# Patient Record
Sex: Male | Born: 1935 | Race: White | Hispanic: No | Marital: Married | Smoking: Never smoker
Health system: Southern US, Community
[De-identification: ages and names within clinical notes are randomized; demographics above are authoritative.]

## PROBLEM LIST (undated history)

## (undated) DIAGNOSIS — R142 Eructation: Secondary | ICD-10-CM

## (undated) DIAGNOSIS — K208 Other esophagitis without bleeding: Secondary | ICD-10-CM

## (undated) DIAGNOSIS — I2510 Atherosclerotic heart disease of native coronary artery without angina pectoris: Secondary | ICD-10-CM

## (undated) DIAGNOSIS — K219 Gastro-esophageal reflux disease without esophagitis: Secondary | ICD-10-CM

## (undated) DIAGNOSIS — R143 Flatulence: Secondary | ICD-10-CM

## (undated) DIAGNOSIS — R141 Gas pain: Secondary | ICD-10-CM

## (undated) DIAGNOSIS — K5730 Diverticulosis of large intestine without perforation or abscess without bleeding: Secondary | ICD-10-CM

## (undated) DIAGNOSIS — K449 Diaphragmatic hernia without obstruction or gangrene: Secondary | ICD-10-CM

## (undated) DIAGNOSIS — K760 Fatty (change of) liver, not elsewhere classified: Secondary | ICD-10-CM

## (undated) DIAGNOSIS — K5090 Crohn's disease, unspecified, without complications: Secondary | ICD-10-CM

## (undated) DIAGNOSIS — G20 Parkinson's disease: Secondary | ICD-10-CM

## (undated) DIAGNOSIS — C439 Malignant melanoma of skin, unspecified: Secondary | ICD-10-CM

## (undated) DIAGNOSIS — K7689 Other specified diseases of liver: Secondary | ICD-10-CM

## (undated) DIAGNOSIS — G20A1 Parkinson's disease without dyskinesia, without mention of fluctuations: Secondary | ICD-10-CM

## (undated) DIAGNOSIS — H9190 Unspecified hearing loss, unspecified ear: Secondary | ICD-10-CM

## (undated) DIAGNOSIS — R269 Unspecified abnormalities of gait and mobility: Secondary | ICD-10-CM

## (undated) DIAGNOSIS — I255 Ischemic cardiomyopathy: Secondary | ICD-10-CM

## (undated) DIAGNOSIS — G2581 Restless legs syndrome: Secondary | ICD-10-CM

## (undated) DIAGNOSIS — R413 Other amnesia: Secondary | ICD-10-CM

## (undated) DIAGNOSIS — E785 Hyperlipidemia, unspecified: Secondary | ICD-10-CM

## (undated) DIAGNOSIS — I5032 Chronic diastolic (congestive) heart failure: Secondary | ICD-10-CM

## (undated) DIAGNOSIS — K5100 Ulcerative (chronic) pancolitis without complications: Secondary | ICD-10-CM

## (undated) DIAGNOSIS — L0390 Cellulitis, unspecified: Secondary | ICD-10-CM

## (undated) DIAGNOSIS — I10 Essential (primary) hypertension: Secondary | ICD-10-CM

## (undated) DIAGNOSIS — R32 Unspecified urinary incontinence: Secondary | ICD-10-CM

## (undated) DIAGNOSIS — B49 Unspecified mycosis: Secondary | ICD-10-CM

## (undated) DIAGNOSIS — K5000 Crohn's disease of small intestine without complications: Secondary | ICD-10-CM

## (undated) DIAGNOSIS — M810 Age-related osteoporosis without current pathological fracture: Secondary | ICD-10-CM

## (undated) DIAGNOSIS — I951 Orthostatic hypotension: Secondary | ICD-10-CM

## (undated) DIAGNOSIS — I4510 Unspecified right bundle-branch block: Secondary | ICD-10-CM

## (undated) DIAGNOSIS — N200 Calculus of kidney: Secondary | ICD-10-CM

## (undated) HISTORY — DX: Hyperlipidemia, unspecified: E78.5

## (undated) HISTORY — PX: CHOLECYSTECTOMY: SHX55

## (undated) HISTORY — DX: Parkinson's disease without dyskinesia, without mention of fluctuations: G20.A1

## (undated) HISTORY — DX: Cellulitis, unspecified: L03.90

## (undated) HISTORY — DX: Crohn's disease, unspecified, without complications: K50.90

## (undated) HISTORY — PX: OTHER SURGICAL HISTORY: SHX169

## (undated) HISTORY — DX: Diaphragmatic hernia without obstruction or gangrene: K44.9

## (undated) HISTORY — DX: Crohn's disease of small intestine without complications: K50.00

## (undated) HISTORY — DX: Ischemic cardiomyopathy: I25.5

## (undated) HISTORY — DX: Unspecified right bundle-branch block: I45.10

## (undated) HISTORY — PX: BACK SURGERY: SHX140

## (undated) HISTORY — PX: TONSILLECTOMY: SUR1361

## (undated) HISTORY — DX: Gastro-esophageal reflux disease without esophagitis: K21.9

## (undated) HISTORY — DX: Unspecified abnormalities of gait and mobility: R26.9

## (undated) HISTORY — PX: APPENDECTOMY: SHX54

## (undated) HISTORY — DX: Other specified diseases of liver: K76.89

## (undated) HISTORY — PX: CATARACT EXTRACTION: SUR2

## (undated) HISTORY — DX: Parkinson's disease: G20

## (undated) HISTORY — DX: Unspecified hearing loss, unspecified ear: H91.90

## (undated) HISTORY — DX: Flatulence: R14.3

## (undated) HISTORY — DX: Restless legs syndrome: G25.81

## (undated) HISTORY — DX: Essential (primary) hypertension: I10

## (undated) HISTORY — DX: Malignant melanoma of skin, unspecified: C43.9

## (undated) HISTORY — DX: Chronic diastolic (congestive) heart failure: I50.32

## (undated) HISTORY — DX: Atherosclerotic heart disease of native coronary artery without angina pectoris: I25.10

## (undated) HISTORY — DX: Other esophagitis: K20.8

## (undated) HISTORY — DX: Flatulence: R14.2

## (undated) HISTORY — DX: Other amnesia: R41.3

## (undated) HISTORY — DX: Unspecified urinary incontinence: R32

## (undated) HISTORY — DX: Ulcerative (chronic) pancolitis without complications: K51.00

## (undated) HISTORY — DX: Other esophagitis without bleeding: K20.80

## (undated) HISTORY — DX: Orthostatic hypotension: I95.1

## (undated) HISTORY — DX: Unspecified mycosis: B49

## (undated) HISTORY — DX: Gas pain: R14.1

## (undated) HISTORY — DX: Calculus of kidney: N20.0

## (undated) HISTORY — DX: Diverticulosis of large intestine without perforation or abscess without bleeding: K57.30

## (undated) HISTORY — DX: Age-related osteoporosis without current pathological fracture: M81.0

---
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
## 1997-09-29 ENCOUNTER — Encounter (INDEPENDENT_AMBULATORY_CARE_PROVIDER_SITE_OTHER): Payer: Self-pay | Admitting: Gastroenterology

## 1997-09-29 DIAGNOSIS — K2210 Ulcer of esophagus without bleeding: Secondary | ICD-10-CM | POA: Insufficient documentation

## 1997-09-29 DIAGNOSIS — K449 Diaphragmatic hernia without obstruction or gangrene: Secondary | ICD-10-CM | POA: Insufficient documentation

## 1998-01-12 ENCOUNTER — Ambulatory Visit (HOSPITAL_COMMUNITY): Admission: RE | Admit: 1998-01-12 | Discharge: 1998-01-12 | Payer: Self-pay | Admitting: *Deleted

## 1998-04-11 ENCOUNTER — Encounter: Payer: Self-pay | Admitting: Neurological Surgery

## 1998-04-17 ENCOUNTER — Encounter: Payer: Self-pay | Admitting: Neurological Surgery

## 1998-04-17 ENCOUNTER — Inpatient Hospital Stay (HOSPITAL_COMMUNITY): Admission: RE | Admit: 1998-04-17 | Discharge: 1998-04-20 | Payer: Self-pay | Admitting: Neurological Surgery

## 1999-12-11 ENCOUNTER — Encounter (INDEPENDENT_AMBULATORY_CARE_PROVIDER_SITE_OTHER): Payer: Self-pay

## 1999-12-11 ENCOUNTER — Other Ambulatory Visit: Admission: RE | Admit: 1999-12-11 | Discharge: 1999-12-11 | Payer: Self-pay | Admitting: Gastroenterology

## 1999-12-11 DIAGNOSIS — K5100 Ulcerative (chronic) pancolitis without complications: Secondary | ICD-10-CM | POA: Insufficient documentation

## 2002-01-01 ENCOUNTER — Ambulatory Visit (HOSPITAL_COMMUNITY): Admission: RE | Admit: 2002-01-01 | Discharge: 2002-01-01 | Payer: Self-pay | Admitting: Gastroenterology

## 2002-01-01 ENCOUNTER — Encounter: Payer: Self-pay | Admitting: Gastroenterology

## 2004-01-23 ENCOUNTER — Ambulatory Visit (HOSPITAL_COMMUNITY): Admission: RE | Admit: 2004-01-23 | Discharge: 2004-01-23 | Payer: Self-pay | Admitting: Gastroenterology

## 2004-02-14 ENCOUNTER — Ambulatory Visit: Payer: Self-pay | Admitting: Gastroenterology

## 2004-12-12 ENCOUNTER — Ambulatory Visit: Payer: Self-pay | Admitting: Gastroenterology

## 2005-03-14 ENCOUNTER — Ambulatory Visit: Payer: Self-pay | Admitting: Gastroenterology

## 2005-03-26 ENCOUNTER — Ambulatory Visit: Payer: Self-pay | Admitting: Gastroenterology

## 2005-03-26 ENCOUNTER — Encounter (INDEPENDENT_AMBULATORY_CARE_PROVIDER_SITE_OTHER): Payer: Self-pay | Admitting: *Deleted

## 2005-03-26 DIAGNOSIS — K649 Unspecified hemorrhoids: Secondary | ICD-10-CM | POA: Insufficient documentation

## 2006-06-11 ENCOUNTER — Ambulatory Visit: Payer: Self-pay | Admitting: *Deleted

## 2006-06-11 ENCOUNTER — Ambulatory Visit: Payer: Self-pay | Admitting: Gastroenterology

## 2006-06-12 LAB — CONVERTED CEMR LAB
Folate: >20 ng/mL
Sed Rate: 26 mm/hr — ABNORMAL HIGH (ref 0–20)
Vitamin B-12: 311 pg/mL (ref 211–911)

## 2006-08-28 ENCOUNTER — Inpatient Hospital Stay (HOSPITAL_COMMUNITY): Admission: EM | Admit: 2006-08-28 | Discharge: 2006-09-05 | Payer: Self-pay | Admitting: Cardiology

## 2006-08-28 ENCOUNTER — Ambulatory Visit: Payer: Self-pay | Admitting: Internal Medicine

## 2006-09-04 ENCOUNTER — Ambulatory Visit: Payer: Self-pay | Admitting: Gastroenterology

## 2006-09-25 ENCOUNTER — Ambulatory Visit: Payer: Self-pay | Admitting: Internal Medicine

## 2006-09-25 HISTORY — PX: CARDIAC CATHETERIZATION: SHX172

## 2006-10-02 ENCOUNTER — Encounter (HOSPITAL_COMMUNITY): Admission: RE | Admit: 2006-10-02 | Discharge: 2006-12-31 | Payer: Self-pay | Admitting: Internal Medicine

## 2006-10-07 ENCOUNTER — Ambulatory Visit: Payer: Self-pay | Admitting: Gastroenterology

## 2006-10-07 LAB — CONVERTED CEMR LAB
ALT: 77 units/L — ABNORMAL HIGH (ref 0–53)
AST: 47 units/L — ABNORMAL HIGH (ref 0–37)
Albumin: 2.9 g/dL — ABNORMAL LOW (ref 3.5–5.2)
Alkaline Phosphatase: 49 units/L (ref 39–117)
BUN: 11 mg/dL (ref 6–23)
Basophils Absolute: 0 10*3/uL (ref 0.0–0.1)
Basophils Relative: 0.8 % (ref 0.0–1.0)
Bilirubin, Direct: 0.2 mg/dL (ref 0.0–0.3)
CO2: 32 meq/L (ref 19–32)
Calcium: 7.8 mg/dL — ABNORMAL LOW (ref 8.4–10.5)
Chloride: 111 meq/L (ref 96–112)
Creatinine, Ser: 0.8 mg/dL (ref 0.4–1.5)
Eosinophils Absolute: 0.1 10*3/uL (ref 0.0–0.6)
Eosinophils Relative: 1.8 % (ref 0.0–5.0)
GFR calc Af Amer: 121 mL/min
GFR calc non Af Amer: 100 mL/min
Glucose, Bld: 126 mg/dL — ABNORMAL HIGH (ref 70–99)
HCT: 34.1 % — ABNORMAL LOW (ref 39.0–52.0)
Hemoglobin: 10.9 g/dL — ABNORMAL LOW (ref 13.0–17.0)
Lymphocytes Relative: 17.4 % (ref 12.0–46.0)
MCHC: 32.1 g/dL (ref 30.0–36.0)
MCV: 103 fL — ABNORMAL HIGH (ref 78.0–100.0)
Monocytes Absolute: 0.4 10*3/uL (ref 0.2–0.7)
Monocytes Relative: 9.7 % (ref 3.0–11.0)
Neutro Abs: 3.3 10*3/uL (ref 1.4–7.7)
Neutrophils Relative %: 70.3 % (ref 43.0–77.0)
Platelets: 314 10*3/uL (ref 150–400)
Potassium: 3.2 meq/L — ABNORMAL LOW (ref 3.5–5.1)
RBC: 3.31 M/uL — ABNORMAL LOW (ref 4.22–5.81)
RDW: 15.6 % — ABNORMAL HIGH (ref 11.5–14.6)
Sodium: 145 meq/L (ref 135–145)
Total Bilirubin: 1.8 mg/dL — ABNORMAL HIGH (ref 0.3–1.2)
Total Protein: 5.8 g/dL — ABNORMAL LOW (ref 6.0–8.3)
WBC: 4.6 10*3/uL (ref 4.5–10.5)

## 2006-10-13 ENCOUNTER — Ambulatory Visit: Payer: Self-pay | Admitting: Gastroenterology

## 2006-10-13 LAB — CONVERTED CEMR LAB
ALT: 89 units/L — ABNORMAL HIGH (ref 0–53)
AST: 68 units/L — ABNORMAL HIGH (ref 0–37)
Albumin: 3 g/dL — ABNORMAL LOW (ref 3.5–5.2)
Alkaline Phosphatase: 48 units/L (ref 39–117)
BUN: 9 mg/dL (ref 6–23)
Bilirubin, Direct: 0.3 mg/dL (ref 0.0–0.3)
CO2: 33 meq/L — ABNORMAL HIGH (ref 19–32)
Calcium: 8.6 mg/dL (ref 8.4–10.5)
Chloride: 108 meq/L (ref 96–112)
Creatinine, Ser: 0.8 mg/dL (ref 0.4–1.5)
GFR calc Af Amer: 121 mL/min
GFR calc non Af Amer: 100 mL/min
Glucose, Bld: 153 mg/dL — ABNORMAL HIGH (ref 70–99)
Potassium: 4.1 meq/L (ref 3.5–5.1)
Sodium: 145 meq/L (ref 135–145)
Total Bilirubin: 2.2 mg/dL — ABNORMAL HIGH (ref 0.3–1.2)
Total Protein: 6.3 g/dL (ref 6.0–8.3)

## 2006-10-14 ENCOUNTER — Encounter: Payer: Self-pay | Admitting: Internal Medicine

## 2006-10-14 ENCOUNTER — Ambulatory Visit: Payer: Self-pay

## 2006-10-24 ENCOUNTER — Ambulatory Visit: Payer: Self-pay | Admitting: Gastroenterology

## 2006-10-24 LAB — CONVERTED CEMR LAB
ALT: 333 units/L — ABNORMAL HIGH (ref 0–53)
AST: 325 units/L — ABNORMAL HIGH (ref 0–37)
Albumin: 3.2 g/dL — ABNORMAL LOW (ref 3.5–5.2)
Alkaline Phosphatase: 68 units/L (ref 39–117)
BUN: 16 mg/dL (ref 6–23)
Basophils Absolute: 0 10*3/uL (ref 0.0–0.1)
Basophils Relative: 0.4 % (ref 0.0–1.0)
CO2: 31 meq/L (ref 19–32)
Calcium: 9.3 mg/dL (ref 8.4–10.5)
Chloride: 101 meq/L (ref 96–112)
Creatinine, Ser: 0.8 mg/dL (ref 0.4–1.5)
Eosinophils Absolute: 0.1 10*3/uL (ref 0.0–0.6)
Eosinophils Relative: 1.8 % (ref 0.0–5.0)
GFR calc Af Amer: 121 mL/min
GFR calc non Af Amer: 100 mL/min
Glucose, Bld: 200 mg/dL — ABNORMAL HIGH (ref 70–99)
HCT: 35.4 % — ABNORMAL LOW (ref 39.0–52.0)
Hemoglobin: 11.8 g/dL — ABNORMAL LOW (ref 13.0–17.0)
Lymphocytes Relative: 14.8 % (ref 12.0–46.0)
MCHC: 33.3 g/dL (ref 30.0–36.0)
MCV: 104.9 fL — ABNORMAL HIGH (ref 78.0–100.0)
Monocytes Absolute: 0.4 10*3/uL (ref 0.2–0.7)
Monocytes Relative: 6.3 % (ref 3.0–11.0)
Neutro Abs: 4.8 10*3/uL (ref 1.4–7.7)
Neutrophils Relative %: 76.7 % (ref 43.0–77.0)
Platelets: 268 10*3/uL (ref 150–400)
Potassium: 5 meq/L (ref 3.5–5.1)
RBC: 3.37 M/uL — ABNORMAL LOW (ref 4.22–5.81)
RDW: 17.5 % — ABNORMAL HIGH (ref 11.5–14.6)
Sodium: 140 meq/L (ref 135–145)
Total Bilirubin: 3.3 mg/dL — ABNORMAL HIGH (ref 0.3–1.2)
Total Protein: 6.2 g/dL (ref 6.0–8.3)
WBC: 6.2 10*3/uL (ref 4.5–10.5)

## 2006-11-17 ENCOUNTER — Ambulatory Visit: Payer: Self-pay | Admitting: Gastroenterology

## 2006-11-17 LAB — CONVERTED CEMR LAB
ALT: 38 units/L (ref 0–53)
AST: 34 units/L (ref 0–37)
Albumin: 3.5 g/dL (ref 3.5–5.2)
Alkaline Phosphatase: 66 units/L (ref 39–117)
BUN: 13 mg/dL (ref 6–23)
Basophils Absolute: 0.1 10*3/uL (ref 0.0–0.1)
Basophils Relative: 1 % (ref 0.0–1.0)
Bilirubin, Direct: 0.2 mg/dL (ref 0.0–0.3)
CO2: 29 meq/L (ref 19–32)
Calcium: 8.7 mg/dL (ref 8.4–10.5)
Chloride: 105 meq/L (ref 96–112)
Creatinine, Ser: 1.2 mg/dL (ref 0.4–1.5)
Eosinophils Absolute: 0.1 10*3/uL (ref 0.0–0.6)
Eosinophils Relative: 2.1 % (ref 0.0–5.0)
GFR calc Af Amer: 76 mL/min
GFR calc non Af Amer: 63 mL/min
Glucose, Bld: 125 mg/dL — ABNORMAL HIGH (ref 70–99)
HCT: 35.5 % — ABNORMAL LOW (ref 39.0–52.0)
Hemoglobin: 12 g/dL — ABNORMAL LOW (ref 13.0–17.0)
Lymphocytes Relative: 21.9 % (ref 12.0–46.0)
MCHC: 33.8 g/dL (ref 30.0–36.0)
MCV: 103.5 fL — ABNORMAL HIGH (ref 78.0–100.0)
Monocytes Absolute: 0.8 10*3/uL — ABNORMAL HIGH (ref 0.2–0.7)
Monocytes Relative: 13.6 % — ABNORMAL HIGH (ref 3.0–11.0)
Neutro Abs: 3.7 10*3/uL (ref 1.4–7.7)
Neutrophils Relative %: 61.4 % (ref 43.0–77.0)
Platelets: 211 10*3/uL (ref 150–400)
Potassium: 4.4 meq/L (ref 3.5–5.1)
RBC: 3.43 M/uL — ABNORMAL LOW (ref 4.22–5.81)
RDW: 16 % — ABNORMAL HIGH (ref 11.5–14.6)
Sodium: 140 meq/L (ref 135–145)
Total Bilirubin: 1.3 mg/dL — ABNORMAL HIGH (ref 0.3–1.2)
Total Protein: 6.2 g/dL (ref 6.0–8.3)
WBC: 6 10*3/uL (ref 4.5–10.5)

## 2006-11-27 ENCOUNTER — Ambulatory Visit: Payer: Self-pay | Admitting: Internal Medicine

## 2007-01-20 ENCOUNTER — Ambulatory Visit: Payer: Self-pay | Admitting: Gastroenterology

## 2007-03-03 ENCOUNTER — Ambulatory Visit: Payer: Self-pay | Admitting: Internal Medicine

## 2007-03-10 ENCOUNTER — Ambulatory Visit: Payer: Self-pay | Admitting: Gastroenterology

## 2007-04-29 DIAGNOSIS — K219 Gastro-esophageal reflux disease without esophagitis: Secondary | ICD-10-CM | POA: Insufficient documentation

## 2007-04-29 DIAGNOSIS — K5090 Crohn's disease, unspecified, without complications: Secondary | ICD-10-CM | POA: Insufficient documentation

## 2007-04-29 DIAGNOSIS — K56609 Unspecified intestinal obstruction, unspecified as to partial versus complete obstruction: Secondary | ICD-10-CM | POA: Insufficient documentation

## 2007-04-29 DIAGNOSIS — I10 Essential (primary) hypertension: Secondary | ICD-10-CM | POA: Insufficient documentation

## 2007-04-29 DIAGNOSIS — E785 Hyperlipidemia, unspecified: Secondary | ICD-10-CM | POA: Insufficient documentation

## 2007-04-29 DIAGNOSIS — K5730 Diverticulosis of large intestine without perforation or abscess without bleeding: Secondary | ICD-10-CM | POA: Insufficient documentation

## 2007-08-18 ENCOUNTER — Ambulatory Visit: Payer: Self-pay | Admitting: Internal Medicine

## 2007-08-24 ENCOUNTER — Encounter: Admission: RE | Admit: 2007-08-24 | Discharge: 2007-08-24 | Payer: Self-pay | Admitting: Internal Medicine

## 2007-09-15 ENCOUNTER — Ambulatory Visit: Payer: Self-pay | Admitting: Gastroenterology

## 2007-10-16 ENCOUNTER — Ambulatory Visit: Payer: Self-pay

## 2008-02-23 ENCOUNTER — Ambulatory Visit: Payer: Self-pay | Admitting: Internal Medicine

## 2008-03-16 ENCOUNTER — Ambulatory Visit: Payer: Self-pay | Admitting: Gastroenterology

## 2008-03-16 DIAGNOSIS — K5000 Crohn's disease of small intestine without complications: Secondary | ICD-10-CM | POA: Insufficient documentation

## 2008-03-18 ENCOUNTER — Telehealth (INDEPENDENT_AMBULATORY_CARE_PROVIDER_SITE_OTHER): Payer: Self-pay | Admitting: *Deleted

## 2008-06-13 ENCOUNTER — Encounter: Payer: Self-pay | Admitting: Gastroenterology

## 2008-07-20 ENCOUNTER — Ambulatory Visit (HOSPITAL_COMMUNITY): Admission: RE | Admit: 2008-07-20 | Discharge: 2008-07-20 | Payer: Self-pay | Admitting: Neurological Surgery

## 2008-07-27 ENCOUNTER — Encounter: Payer: Self-pay | Admitting: Internal Medicine

## 2008-08-09 ENCOUNTER — Encounter (INDEPENDENT_AMBULATORY_CARE_PROVIDER_SITE_OTHER): Payer: Self-pay | Admitting: *Deleted

## 2008-08-23 ENCOUNTER — Inpatient Hospital Stay (HOSPITAL_COMMUNITY): Admission: RE | Admit: 2008-08-23 | Discharge: 2008-08-28 | Payer: Self-pay | Admitting: Neurological Surgery

## 2008-08-25 ENCOUNTER — Ambulatory Visit: Payer: Self-pay | Admitting: Internal Medicine

## 2008-09-07 ENCOUNTER — Encounter: Payer: Self-pay | Admitting: Gastroenterology

## 2008-09-07 ENCOUNTER — Encounter: Payer: Self-pay | Admitting: Internal Medicine

## 2008-10-07 ENCOUNTER — Encounter: Payer: Self-pay | Admitting: Gastroenterology

## 2008-10-07 ENCOUNTER — Encounter: Payer: Self-pay | Admitting: Internal Medicine

## 2008-10-19 ENCOUNTER — Ambulatory Visit: Payer: Self-pay | Admitting: Gastroenterology

## 2008-11-17 ENCOUNTER — Encounter: Payer: Self-pay | Admitting: Internal Medicine

## 2008-11-17 ENCOUNTER — Encounter: Payer: Self-pay | Admitting: Gastroenterology

## 2009-01-12 ENCOUNTER — Encounter (INDEPENDENT_AMBULATORY_CARE_PROVIDER_SITE_OTHER): Payer: Self-pay | Admitting: *Deleted

## 2009-01-30 ENCOUNTER — Telehealth: Payer: Self-pay | Admitting: Gastroenterology

## 2009-02-06 ENCOUNTER — Ambulatory Visit: Payer: Self-pay | Admitting: Internal Medicine

## 2009-02-15 ENCOUNTER — Encounter: Payer: Self-pay | Admitting: Internal Medicine

## 2009-08-23 ENCOUNTER — Encounter: Payer: Self-pay | Admitting: Internal Medicine

## 2009-10-12 ENCOUNTER — Encounter: Payer: Self-pay | Admitting: Nurse Practitioner

## 2009-10-19 ENCOUNTER — Telehealth: Payer: Self-pay | Admitting: Gastroenterology

## 2009-10-19 ENCOUNTER — Encounter: Payer: Self-pay | Admitting: Nurse Practitioner

## 2009-10-20 ENCOUNTER — Ambulatory Visit: Payer: Self-pay | Admitting: Gastroenterology

## 2009-10-20 DIAGNOSIS — R945 Abnormal results of liver function studies: Secondary | ICD-10-CM | POA: Insufficient documentation

## 2009-10-20 DIAGNOSIS — R933 Abnormal findings on diagnostic imaging of other parts of digestive tract: Secondary | ICD-10-CM | POA: Insufficient documentation

## 2009-10-20 LAB — CONVERTED CEMR LAB
ALT: 33 units/L (ref 0–53)
AST: 29 units/L (ref 0–37)
Albumin: 3.9 g/dL (ref 3.5–5.2)
Alkaline Phosphatase: 70 units/L (ref 39–117)
Bilirubin, Direct: 0.3 mg/dL (ref 0.0–0.3)
Total Bilirubin: 1.5 mg/dL — ABNORMAL HIGH (ref 0.3–1.2)
Total Protein: 6.6 g/dL (ref 6.0–8.3)

## 2009-10-24 ENCOUNTER — Ambulatory Visit (HOSPITAL_COMMUNITY): Admission: RE | Admit: 2009-10-24 | Discharge: 2009-10-24 | Payer: Self-pay | Admitting: Gastroenterology

## 2010-01-08 ENCOUNTER — Encounter: Payer: Self-pay | Admitting: Internal Medicine

## 2010-02-21 ENCOUNTER — Ambulatory Visit: Payer: Self-pay | Admitting: Internal Medicine

## 2010-02-23 ENCOUNTER — Ambulatory Visit: Payer: Self-pay | Admitting: Gastroenterology

## 2010-03-05 ENCOUNTER — Telehealth (INDEPENDENT_AMBULATORY_CARE_PROVIDER_SITE_OTHER): Payer: Self-pay | Admitting: Radiology

## 2010-03-06 ENCOUNTER — Encounter (HOSPITAL_COMMUNITY)
Admission: RE | Admit: 2010-03-06 | Discharge: 2010-05-08 | Payer: Self-pay | Source: Home / Self Care | Attending: Internal Medicine | Admitting: Internal Medicine

## 2010-03-06 ENCOUNTER — Encounter: Payer: Self-pay | Admitting: Internal Medicine

## 2010-03-06 ENCOUNTER — Encounter: Payer: Self-pay | Admitting: *Deleted

## 2010-03-06 ENCOUNTER — Ambulatory Visit: Payer: Self-pay | Admitting: Cardiology

## 2010-03-06 ENCOUNTER — Ambulatory Visit: Payer: Self-pay

## 2010-03-23 ENCOUNTER — Telehealth: Payer: Self-pay | Admitting: Gastroenterology

## 2010-04-18 ENCOUNTER — Encounter: Payer: Self-pay | Admitting: Gastroenterology

## 2010-04-19 ENCOUNTER — Encounter: Payer: Self-pay | Admitting: Gastroenterology

## 2010-04-28 ENCOUNTER — Encounter: Payer: Self-pay | Admitting: Gastroenterology

## 2010-05-10 NOTE — Assessment & Plan Note (Signed)
Summary: Cardiology Nuclear Testing  Nuclear Med Background Indications for Stress Test: Evaluation for Ischemia, Stent Patency, PTCA Patency   History: Angioplasty, COPD, Echo, Heart Catheterization, Myocardial Infarction, Stents  History Comments: 7/08 Echo EF=60%; '09 MI / Cath / PTCA >LAD @[facility].  Symptoms: Chest Pain, DOE, Fatigue, Fatigue with Exertion, Palpitations, SOB    Nuclear Pre-Procedure Cardiac Risk Factors: Family History - CAD, Hypertension, Lipids Caffeine/Decaff Intake: none NPO After: 7:00 PM Lungs: clear IV 0.9% NS with Angio Cath: 22g     IV Site: R Wrist IV Started by: Matilde Haymaker, RN Chest Size (in) 42     Height (in): 70 Weight (lb): 168 BMI: 24.19 Tech Comments: Carvedilol held x 24hrs.  Nuclear Med Study 1 or 2 day study:  1 day     Stress Test Type:  Treadmill/Lexiscan Reading MD:  Darlin Coco, MD     Referring MD:  D.Bensimhon Resting Radionuclide:  Technetium 59mTetrofosmin     Resting Radionuclide Dose:  11.0 mCi  Stress Radionuclide:  Technetium 966metrofosmin     Stress Radionuclide Dose:  33.0 mCi   Stress Protocol  Max Systolic BP: 15449m Hg Lexiscan: 0.4 mg   Stress Test Technologist:  SaPerrin MalteseEMT-P     Nuclear Technologist:  StCharlton AmorCNMT  Rest Procedure  Myocardial perfusion imaging was performed at rest 45 minutes following the intravenous administration of Technetium 9915mtrofosmin.  Stress Procedure  The patient received IV Lexiscan 0.4 mg over 15-seconds with concurrent low level exercise and then Technetium 55m51mrofosmin was injected at 30-seconds while the patient continued walking one more minute.  There were no significant changes with Lexiscan.  Quantitative spect images were obtained after a 45 minute delay.  QPS Raw Data Images:  Normal; no motion artifact; normal heart/lung ratio. Stress Images:  Normal homogeneous uptake in all areas of the myocardium. Rest Images:  Normal  homogeneous uptake in all areas of the myocardium. Subtraction (SDS):  No evidence of ischemia. Transient Ischemic Dilatation:  1.02  (Normal <1.22)  Lung/Heart Ratio:  .29  (Normal <0.45)  Quantitative Gated Spect Images QGS EDV:  62 ml QGS ESV:  18 ml QGS EF:  71 % QGS cine images:  Normal LV systolic function.  Findings Normal nuclear study      Overall Impression  Exercise Capacity: Lexiscan with no exercise. BP Response: Normal blood pressure response. Clinical Symptoms: No chest pain ECG Impression: No significant ST segment change suggestive of ischemia. Overall Impression: Normal stress nuclear study. Overall Impression Comments: Normal LexiOccupational psychologistdy.  Appended Document: Cardiology Nuclear Testing ok  Appended Document: Cardiology Nuclear Testing pt aware.

## 2010-05-10 NOTE — Letter (Signed)
Summary: [facility]  [facility]   Imported By: Bubba Hales 11/03/2009 09:42:22  _____________________________________________________________________  External Attachment:    Type:   Image     Comment:   External Document

## 2010-05-10 NOTE — Assessment & Plan Note (Signed)
GI PROBLEM LIST: 1. Crohn's disease.  Distant ileal and right colon resection by Dr. Gretta Cool in the very distant past.  He was maintained on Pentasa, Entocort, and 35m of Purinethol for several years under the care of Dr. SLyla Son  Hospitalization, May 2008, for acute myocardial infarction complicated by small bowel obstruction likely due to active Crohn's. Increased Purinethol to 100 mg daily and liver tests increased as well.  Purinethol held and liver test normalized.  The patient felt much better overall as well (less diarrhea, less fatigue).  Winter, 2009: currently on 6 pills of Pentasa day feeling quite well overall.   History of Present Illness Visit Type: follow up Primary GI MD: DOwens LofflerMD Primary Provider: Dr. JShon BatonChief Complaint: crohn's follow up History of Present Illness:     Overall very well since last visit 6 months ago.    Normally goes 3 times a day, usually afer each meal.  No bleeding.  No significant pains.  No cramping.  BMs are semiformed.  He is up 4 pounds since last visit.               Prior Medications Reviewed Using: List Brought by Patient  Updated Prior Medication List: V-C FORTE   CAPS (MULTIPLE VITAMINS-MINERALS) 1 once daily PENTASA 250 MG CPCR (MESALAMINE) Take 2 capsule by mouth three times a day PREVACID 30 MG CPDR (LANSOPRAZOLE) Take 1 capsule by mouth once a day CARVEDILOL 6.25 MG  TABS (CARVEDILOL) take 2 once daily PLAVIX 75 MG  TABS (CLOPIDOGREL BISULFATE) once daily LISINOPRIL 10 MG  TABS (LISINOPRIL) once daily BAYER ASPIRIN 325 MG  TABS (ASPIRIN) once daily LIPITOR 40 MG TABS (ATORVASTATIN CALCIUM) once daily POTASSIUM GLUCONATE 550 MG TABS (POTASSIUM GLUCONATE) once daily NITROGLYCERIN 0.4 MG/HR PT24 (NITROGLYCERIN) as needed TYLENOL EXTRA STRENGTH 500 MG TABS (ACETAMINOPHEN) as needed ALEVE 220 MG CAPS (NAPROXEN SODIUM) as needed CALCIUM 600/VITAMIN D 600-400 MG-UNIT TABS (CALCIUM CARBONATE-VITAMIN  D) once daily  Current Allergies: No known allergies       Vital Signs:  Patient Profile:   77Years Old Male Height:     70 inches Weight:      162.25 pounds BMI:     23.36 Pulse rate:   80 / minute Pulse rhythm:   regular BP sitting:   122 / 76  (left arm)  Vitals Entered By: June McMurray CMA (March 16, 2008 11:10 AM)                  Physical Exam  Constitutional: generally well appearing Psychiatric: alert and oriented times 3 Abdomen: soft, non-tender, non-distended, normal bowel sounds     Impression & Recommendations:  Problem # 1:  CROHN'S DISEASE-SMALL INTESTINE (ICD-555.0) Doing well on Pentasa 6 pills a day. He will continue this regimen for now and return to see me in 6 months time sooner if needed. I will call in a refill for her for the Pentasa today.   Patient Instructions: 1)  Stay on the pentasa, refills will be called in. 2)  A copy of this information will be sent to Dr. RVirgina Jock 3)  We will get records sent over from your recent blood work from Dr. RKeane Policeoffice. 4)  Return to see Dr. JArdis Hughsin 6 months, sooner if needed.    ]  Appended Document:  please call patient.  I reviewed faxed labs from Dr. RKeane Policeoffice.  CBC, CMET were both normal from GI standpoint.  He  should continue with plan as discussed in the office recently.  Appended Document:  pt aware

## 2010-05-10 NOTE — Progress Notes (Signed)
Summary: triage   Phone Note Call from Patient Call back at Home Phone [phone]   Caller: Patient Call For: Dr. Ardis Hughs Reason for Call: Talk to Nurse Summary of Call: would like to be worked in for Crohn's flare Initial call taken by: Lucien Mons,  October 19, 2009 4:04 PM  Follow-up for Phone Call        left message on machine to call back [place] Deborra Medina)  October 19, 2009 4:11 PM   pt having crohns flare, Dr Virgina Jock wanted pt seen for excessive diarhhea scheduled for appt with Nevin Bloodgood 10/20/09  Follow-up by: Christian Mate CMA Deborra Medina),  October 19, 2009 4:55 PM

## 2010-05-10 NOTE — Letter (Signed)
Summary: [place] Office Visit Note   [place] Office Visit Note   Imported By: Sallee Provencal 01/19/2010 12:50:25  _____________________________________________________________________  External Attachment:    Type:   Image     Comment:   External Document

## 2010-05-10 NOTE — Progress Notes (Signed)
  Phone Note Outgoing Call   Call placed by: Christian Mate CMA,  March 18, 2008 11:50 AM Summary of Call: left message on machine to call back  Follow-up for Phone Call        pt aware of labs Follow-up by: Christian Mate CMA,  March 18, 2008 2:46 PM

## 2010-05-10 NOTE — Assessment & Plan Note (Signed)
Summary: RECALL REV/FH  GI PROBLEM LIST: 1. Crohn's disease.  Distant ileal and right colon resection by Dr. Gretta Cool in the very distant past.  He was maintained on Pentasa, Entocort, and 92m of Purinethol for several years under the care of Dr. SLyla Son  Hospitalization, May 2008, for acute myocardial infarction complicated by small bowel obstruction likely due to active Crohn's. Increased Purinethol to 100 mg daily and liver tests increased as well.  Purinethol held and liver test normalized.  The patient felt much better overall as well (less diarrhea, less fatigue).  Winter, 2009: currently on 6 pills of Pentasa day feeling quite well overall.  Summer, 2010 postoperative ileus following spine surgery (doubt active Crohn's flare).     History of Present Illness Visit Type: follow up  Primary GI MD: DOwens LofflerMD Primary Provider:  JShon Baton MD Requesting Provider: n/a Chief Complaint: F/u for Crohn's  History of Present Illness:     77year old man whom I last saw 8-9 months ago. He recently had a prolonged hospitalization following spinal surgery, it sounds as if he had ileus following surgery for his spine.  Had an NG tube and enema temporarily. there was question of Crohn's exacerbation  He was put on prednisone while he was hospitalized, weaned himself off the prednisone quickly.  Out of hospital for 2 months, has done very well (he usually has 2-3bms  a day, non-bloody).           Current Medications (verified): 1)  V-C Forte   Caps (Multiple Vitamins-Minerals) ..Marland Kitchen. 1 Once Daily 2)  Pentasa 250 Mg Cpcr (Mesalamine) .... Take 2 Capsule By Mouth Three Times A Day 3)  Prevacid 30 Mg Cpdr (Lansoprazole) .... Take 1 Capsule By Mouth Once A Day 4)  Carvedilol 6.25 Mg  Tabs (Carvedilol) .... Take 2 Once Daily 5)  Plavix 75 Mg  Tabs (Clopidogrel Bisulfate) .... Once Daily 6)  Lisinopril 10 Mg  Tabs (Lisinopril) .... Once Daily 7)  Bayer Aspirin 325 Mg  Tabs (Aspirin)  .... Once Daily 8)  Lipitor 40 Mg Tabs (Atorvastatin Calcium) .... Once Daily 9)  Potassium Gluconate 550 Mg Tabs (Potassium Gluconate) .... Once Daily 10)  Nitroglycerin 0.4 Mg/hr Pt24 (Nitroglycerin) .... As Needed 11)  Tylenol Extra Strength 500 Mg Tabs (Acetaminophen) .... As Needed 12)  Calcium 600/vitamin D 600-400 Mg-Unit Tabs (Calcium Carbonate-Vitamin D) .... Once Daily  Allergies (verified): No Known Drug Allergies  Vital Signs:  Patient profile:   77year old male Height:      70 inches Weight:      160 pounds BMI:     23.04 BSA:     1.90 Pulse rate:   88 / minute Pulse rhythm:   regular BP sitting:   118 / 72  (left arm) Cuff size:   regular  Vitals Entered By: KHope PigeonCMA (October 19, 2008 2:59 PM)  Physical Exam  Additional Exam:  Constitutional: generally well appearing Psychiatric: alert and oriented times 3 Abdomen: soft, non-tender, non-distended, normal bowel sounds    Impression & Recommendations:  Problem # 1:  CROHN'S DISEASE-SMALL INTESTINE (ICD-555.0) I suspect that he had postoperative ileus following his spinal surgery rather than an inflammatory flare of his colitis. He responded to mechanical treatments. He was also put on prednisone but he quickly tapered at off after he was discharged from the hospital. This just seems to fit more with ileus rather than a flare of his underlying bowel disease. Either way he is  doing well now on no prednisone. He takes his usual Pentasa 6 pills a day. On that regimen he has 2-3 soft bowels a day. He will return to see me in 6 months and sooner if needed.  Patient Instructions: 1)  Stay on pentasa 6 pills a day. 2)  Return to see Dr. Ardis Hughs in 6 months. 3)  A copy of this information will be sent to Dr. Ellene Route, Virgina Jock. 4)  The medication list was reviewed and reconciled.  All changed / newly prescribed medications were explained.  A complete medication list was provided to the patient / caregiver.

## 2010-05-10 NOTE — Letter (Signed)
Summary: [facility]  [facility]   Imported By: Marilynne Drivers 09/27/2008 10:37:16  _____________________________________________________________________  External Attachment:    Type:   Image     Comment:   External Document

## 2010-05-10 NOTE — Letter (Signed)
Summary: [facility]  [facility]   Imported By: Bubba Hales 10/28/2008 09:02:54  _____________________________________________________________________  External Attachment:    Type:   Image     Comment:   External Document

## 2010-05-10 NOTE — Progress Notes (Signed)
Summary: prior auth   Phone Note Call from Patient Call back at [phone]   Caller: Patient Call For: Ardis Hughs Reason for Call: Talk to Nurse Summary of Call: Patient needs a prior auth to get his [place] please call [phone], please call patient when done Initial call taken by: Ronalee Red,  January 30, 2009 9:52 AM  Follow-up for Phone Call        informed pt to have the pharmacy fax a prior auth form and I would take care of it for him.  He will have the pharmacy send this today. Follow-up by: Christian Mate CMA Deborra Medina),  January 30, 2009 10:19 AM

## 2010-05-10 NOTE — Letter (Signed)
Summary: [facility]  [facility]   Imported By: Sallee Provencal 10/01/2008 09:32:29  _____________________________________________________________________  External Attachment:    Type:   Image     Comment:   External Document

## 2010-05-10 NOTE — Letter (Signed)
Summary: [facility]  [facility]   Imported By: Phillis Knack 09/14/2008 14:00:21  _____________________________________________________________________  External Attachment:    Type:   Image     Comment:   External Document

## 2010-05-10 NOTE — Assessment & Plan Note (Signed)
Summary: ROV/ GD  Medications Added CARVEDILOL 6.25 MG  TABS (CARVEDILOL) two times a day HYDROCODONE-ACETAMINOPHEN 5-500 MG TABS (HYDROCODONE-ACETAMINOPHEN) as needed      Allergies Added: NKDA  Visit Type:  Follow-up Referring Provider:  n/a Primary Provider:   Shon Baton, MD   History of Present Illness: [name] [name] is a delightful 77 year old man with a history of coronary artery disease status post anterior ST elevation myocardial infarction earlier this year treated with angioplasty and bare metal stenting of the LAD at [facility]. Ejection fraction initially was 35-40% but most recently 60%.  He also has a history of hypertension, hyperlipidemia, Crohn's  disease as well as gastroesophageal reflux disease.   Doing well from cardiac standpoint.  Exercising 3-4x/week with weights and bike. No CP or SOB. No problems with medications. No swellling or palpitations  Had back operation in May and it was complicated by recurrent SBO.  Recent lipids with Dr. Virgina Jock TC 108 TG 75 HDL 45 LDL 48  Current Medications (verified): 1)  V-C Forte   Caps (Multiple Vitamins-Minerals) .Marland Kitchen.. 1 Once Daily 2)  Pentasa 250 Mg Cpcr (Mesalamine) .... Take 2 Capsule By Mouth Three Times A Day 3)  Prevacid 30 Mg Cpdr (Lansoprazole) .... Take 1 Capsule By Mouth Once A Day 4)  Carvedilol 6.25 Mg  Tabs (Carvedilol) .... Two Times A Day 5)  Plavix 75 Mg  Tabs (Clopidogrel Bisulfate) .... Once Daily 6)  Lisinopril 10 Mg  Tabs (Lisinopril) .... Once Daily 7)  Bayer Aspirin 325 Mg  Tabs (Aspirin) .... Once Daily 8)  Lipitor 40 Mg Tabs (Atorvastatin Calcium) .... Once Daily 9)  Potassium Gluconate 550 Mg Tabs (Potassium Gluconate) .... Once Daily 10)  Nitroglycerin 0.4 Mg/hr Pt24 (Nitroglycerin) .... As Needed 11)  Tylenol Extra Strength 500 Mg Tabs (Acetaminophen) .... As Needed 12)  Calcium 600/vitamin D 600-400 Mg-Unit Tabs (Calcium Carbonate-Vitamin D) .... Once Daily 13)  Hydrocodone-Acetaminophen 5-500 Mg Tabs  (Hydrocodone-Acetaminophen) .... As Needed  Allergies (verified): No Known Drug Allergies  Past History:  Past Medical History: Current Problems:  FATTY LIVER DISEASE (ICD-571.8) EROSIVE ESOPHAGITIS (ICD-530.19) HIATAL HERNIA (ICD-553.3) ULCERATIVE ILEOCOLITIS (ICD-556.1) DIVERTICULOSIS, COLON (ICD-562.10) HEMORRHOIDS (ICD-455.6) GERD (ICD-530.81) HYPERLIPIDEMIA (ICD-272.4) HYPERTENSION (ICD-401.9) CAD (ICD-414.00)   --s/p LAD stenting SMALL BOWEL OBSTRUCTION (ICD-560.9) CROHN'S DISEASE (ICD-555.9)  Review of Systems       As per HPI and past medical history; otherwise all systems negative.   Vital Signs:  Patient profile:   77 year old male Height:      70 inches Weight:      167 pounds BMI:     24.05 Pulse rate:   76 / minute BP sitting:   110 / 62  (left arm)  Vitals Entered By: Margaretmary Bayley CMA (February 06, 2009 2:11 PM)  Physical Exam  General:  Gen: well appearing. no resp difficulty HEENT: normal Neck: supple. no JVD. Carotids 2+ bilat; no bruits. No lymphadenopathy or thryomegaly appreciated. Cor: PMI nondisplaced. Regular rate & rhythm. No rubs, gallops, murmur. Lungs: clear Abdomen: soft, nontender, nondistended. No hepatosplenomegaly. No bruits or masses. Good bowel sounds. Extremities: no cyanosis, clubbing, rash, edema Neuro: alert & orientedx3, cranial nerves grossly intact. moves all 4 extremities w/o difficulty. affect pleasant    Impression & Recommendations:  Problem # 1:  CAD (ICD-414.00) Stable. No evidence of ischemia. Continue current regimen.  Problem # 2:  HYPERTENSION (ICD-401.9) Blood pressure well controlled. Continue current regimen.  Problem # 3:  HYPERLIPIDEMIA (ZES-923.4) Followed by Dr. Virgina Jock.  LDL at goal (LDL < 70). Continue lipitor.   Other Orders: EKG w/ Interpretation (93000)

## 2010-05-10 NOTE — Procedures (Signed)
Summary: Gastroenterology EGD  Gastroenterology EGD   Imported By: Marlon Pel 05/06/2007 14:37:33  _____________________________________________________________________  External Attachment:    Type:   Image     Comment:   External Document

## 2010-05-10 NOTE — Progress Notes (Signed)
Summary: nuc pre-procedure  Phone Note Outgoing Call   Call placed by: Vedia Pereyra CNMT Call placed to: Patient Reason for Call: Confirm/change Appt Summary of Call: Reviewed information on [employer] Information Sheet (see scanned document for further details).  Spoke with patient.      Nuclear Med Background Indications for Stress Test: Evaluation for Ischemia, Stent Patency, PTCA Patency   History: Angioplasty, COPD, Echo, Heart Catheterization, Myocardial Infarction, Stents  History Comments: 7/08 Echo EF=60%; '09 MI / Cath / PTCA >LAD @[facility].  Symptoms: Chest Pain, DOE, Fatigue, Fatigue with Exertion, SOB    Nuclear Pre-Procedure Cardiac Risk Factors: Family History - CAD, Hypertension, Lipids Height (in): 70

## 2010-05-10 NOTE — Letter (Signed)
Summary: Recall-Office Visit Letter  [facility]  [place], [place] [address]   Phone: [phone]  Fax: [phone]      Aug 09, 2008 MRN: [id number]   [name] [name] [address] [place], [place]  [address]   Dear Mr. Kinzie,   According to our records, it is time for you to schedule a follow-up office visit with [place] in the month of June.   At your convenience, please call [phone] (option #2)to schedule an office visit. If you have any questions, concerns, or feel that this letter is in error, we would appreciate your call.   Sincerely,  [name], M.D.   [facility] Gastroenterology Division [phone]

## 2010-05-10 NOTE — Assessment & Plan Note (Signed)
GI PROBLEM LIST: 1. Crohn's disease.  Distant ileal and right colon resection by Dr. Gretta Cool in the very distant past.  He was maintained on Pentasa, Entocort, and 22m of Purinethol for several years under the care of Dr. SLyla Son  Hospitalization, May 2008, for acute myocardial infarction complicated by small bowel obstruction likely due to active Crohn's. Increased Purinethol to 100 mg daily and liver tests increased as well.  Purinethol held and liver test normalized.  The patient felt much better overall as well (less diarrhea, less fatigue).  Winter, 2009: currently on 6 pills of Pentasa day feeling quite well overall.  Summer, 2010 postoperative ileus following spine surgery (doubt active Crohn's flare).  History of Present Illness Visit Type: Follow-up Visit Primary GI MD: DOwens LofflerMD Primary Provider: JShon Baton MD  Requesting Provider: na Chief Complaint: diarrhea  History of Present Illness:     very pleasant 77year old man whom I last saw about a year and a half ago.who has increased frequency, urgency (6-7 times  a day).  + nocturnal symptoms for at least a year or so.    Takes immodium periodically.  he has no abdominal pain, no rectal bleeding.  he had a set of blood work done about one month ago and this shows normal CBC, normal basic metabolic profile, his bilirubin was 2.1. Normal platelets.   scheduled for stress test soon.         He   Current Medications (verified): 1)  Centrum Silver  Tabs (Multiple Vitamins-Minerals) .... One Tablet By Mouth Once Daily 2)  Pentasa 250 Mg Cpcr (Mesalamine) .... Take 2 Capsule By Mouth Three Times A Day 3)  Prevacid 30 Mg Cpdr (Lansoprazole) .... Take 1 Capsule By Mouth Once A Day 4)  Carvedilol 6.25 Mg  Tabs (Carvedilol) .... Two Times A Day 5)  Plavix 75 Mg  Tabs (Clopidogrel Bisulfate) .... Once Daily 6)  Lisinopril 10 Mg  Tabs (Lisinopril) .... Once Daily 7)  Aspirin 81 Mg  Tabs (Aspirin) .... One Tablet By  Mouth Once Daily 8)  Lipitor 40 Mg Tabs (Atorvastatin Calcium) .... Once Daily 9)  Potassium 99 Mg Tabs (Potassium) .... One Tablet By Mouth Once Daily 10)  Nitroglycerin 0.4 Mg/hr Pt24 (Nitroglycerin) .... As Needed 11)  Tylenol Extra Strength 500 Mg Tabs (Acetaminophen) .... As Needed 12)  Calcium 600/vitamin D 600-400 Mg-Unit Tabs (Calcium Carbonate-Vitamin D) .... Once Daily 13)  Hydrocodone-Acetaminophen 5-500 Mg Tabs (Hydrocodone-Acetaminophen) .... As Needed 14)  Metoclopramide Hcl 5 Mg Tabs (Metoclopramide Hcl) .... One Tablet By Mouth Every 8 Hours As Needed 15)  Vitamin D3 400 Unit Tabs (Cholecalciferol) .... One Tablet By Mouth Once Daily 16)  Imodium A-D 1 Mg/7.531mLiqd (Loperamide Hcl) .... As Needed  Allergies (verified): No Known Drug Allergies  Vital Signs:  Patient profile:   [age]ear old male Height:      70 inches Weight:      170 pounds BMI:     24.48 BSA:     1.95 Pulse rate:   76 / minute Pulse rhythm:   regular BP sitting:   136 / 80  (left arm) Cuff size:   regular  Vitals Entered By: KeHope PigeonMA (February 23, 2010 1:57 PM)  Physical Exam  Additional Exam:  Constitutional: generally well appearing Psychiatric: alert and oriented times 3 Abdomen: soft, non-tender, non-distended, normal bowel sounds    Impression & Recommendations:  Problem # 1:  Crohn's, diarrhea not clear if his chronic loose  stools her Crohn's related ( inflammatory related) or perhaps from 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  Recent CBC shows no elevation of white count or platelets arguing against significant ongoing inflammation. He will try cholestyramine powder 4 g once daily and will call here in 4-5 weeks to report on his symptoms. If he has not noticed significant improvement then I think we should proceed with colonoscopy to restage his disease.  Patient Instructions: 1)  Trial of cholestyramine powder, take 4gm once daily. 2)  Call Dr. Ardis Hughs  office in 4 weeks to report on your symptoms. 3)  A copy of this information will be sent to Dr. Virgina Jock. 4)  The medication list was reviewed and reconciled.  All changed / newly prescribed medications were explained.  A complete medication list was provided to the patient / caregiver. Prescriptions: CHOLESTYRAMINE   POWD (CHOLESTYRAMINE) take 4gram of powder, once daily  #1 month x 3   Entered and Authorized by:   [name] MD   Signed by:   [name] MD on 02/23/2010   Method used:   Electronically to        [facility] (retail)       [address] [place], [place]  [id number]       Ph: [phone] or [phone]       Fax: [phone]   RxID:   [id number]

## 2010-05-10 NOTE — Miscellaneous (Signed)
Summary: pentasa  Clinical Lists Changes  Medications: Rx of PENTASA 250 MG CPCR (MESALAMINE) Take 2 capsule by mouth three times a day;  #180 x 10;  Signed;  Entered by: Christian Mate CMA (AAMA);  Authorized by: [name] MD;  Method used: Electronically to [facility] [address] [place], [place]  [id number], Ph: [phone] or [phone], Fax: [phone]    Prescriptions: PENTASA 250 MG CPCR (MESALAMINE) Take 2 capsule by mouth three times a day  #180 x 10   Entered by:   Christian Mate CMA ([place])   Authorized by:   [name] MD   Signed by:   Christian Mate CMA ([place]) on 04/18/2010   Method used:   Electronically to        [facility] (retail)       [address], [place]  [id number]       Ph: [phone] or [phone]       Fax: [phone]   RxID:   [id number]

## 2010-05-10 NOTE — Letter (Signed)
Summary: [facility]  [facility]   Imported By: Bubba Hales 11/03/2009 09:45:00  _____________________________________________________________________  External Attachment:    Type:   Image     Comment:   External Document

## 2010-05-10 NOTE — Assessment & Plan Note (Signed)
Summary: PER CHECK OUT/SF      Allergies Added: NKDA  Visit Type:  Follow-up Referring Provider:  Shon Baton, MD Primary Provider:   Shon Baton, MD  CC:  shortness of breath -- fatigue.  History of Present Illness: [name] [name] is a delightful 77 year old man with a history of coronary artery disease status post anterior ST elevation myocardial infarction treated with angioplasty and bare metal stenting of the LAD at [facility] in January 2009 . Ejection fraction initially was 35-40% but most recently 60%.  He also has a history of hypertension, hyperlipidemia, Crohn's  disease as well as gastroesophageal reflux disease.   Doing well from cardiac standpoint.  Exercising 3-4x/week with a trainer and plays golf but feels like he is slowing down significantly. Says he can't walk far anymore No CP. Occasional dyspnea. Wiped out by end of the day.  Denies snoring.    Recent lipids with Dr. Virgina Jock TC 104 TG 136 HDL 41 LDL 36 HgB 13.6  Current Medications (verified): 1)  Centrum Silver  Tabs (Multiple Vitamins-Minerals) .... One Tablet By Mouth Once Daily 2)  Pentasa 250 Mg Cpcr (Mesalamine) .... Take 2 Capsule By Mouth Three Times A Day 3)  Prevacid 30 Mg Cpdr (Lansoprazole) .... Take 1 Capsule By Mouth Once A Day 4)  Carvedilol 6.25 Mg  Tabs (Carvedilol) .... Two Times A Day 5)  Plavix 75 Mg  Tabs (Clopidogrel Bisulfate) .... Once Daily 6)  Lisinopril 10 Mg  Tabs (Lisinopril) .... Once Daily 7)  Aspirin 81 Mg  Tabs (Aspirin) .... One Tablet By Mouth Once Daily 8)  Lipitor 40 Mg Tabs (Atorvastatin Calcium) .... Once Daily 9)  Potassium 99 Mg Tabs (Potassium) .... One Tablet By Mouth Once Daily 10)  Nitroglycerin 0.4 Mg/hr Pt24 (Nitroglycerin) .... As Needed 11)  Tylenol Extra Strength 500 Mg Tabs (Acetaminophen) .... As Needed 12)  Calcium 600/vitamin D 600-400 Mg-Unit Tabs (Calcium Carbonate-Vitamin D) .... Once Daily 13)  Hydrocodone-Acetaminophen 5-500 Mg Tabs (Hydrocodone-Acetaminophen) .... As  Needed 14)  Metoclopramide Hcl 5 Mg Tabs (Metoclopramide Hcl) .... One Tablet By Mouth Every 8 Hours As Needed 15)  Vitamin D3 400 Unit Tabs (Cholecalciferol) .... One Tablet By Mouth Once Daily 16)  Align  Caps (Probiotic Product) .... One Capsule By Mouth Once Daily  Allergies (verified): No Known Drug Allergies  Past History:  Past Medical History: Last updated: 02/06/2009 Current Problems:  FATTY LIVER DISEASE (ICD-571.8) EROSIVE ESOPHAGITIS (ICD-530.19) HIATAL HERNIA (ICD-553.3) ULCERATIVE ILEOCOLITIS (ICD-556.1) DIVERTICULOSIS, COLON (ICD-562.10) HEMORRHOIDS (ICD-455.6) GERD (ICD-530.81) HYPERLIPIDEMIA (ICD-272.4) HYPERTENSION (ICD-401.9) CAD (ICD-414.00)   --s/p LAD stenting SMALL BOWEL OBSTRUCTION (ICD-560.9) CROHN'S DISEASE (ICD-555.9)  Review of Systems       As per HPI and past medical history; otherwise all systems negative.   Vital Signs:  Patient profile:   77 year old male Height:      70 inches Weight:      169 pounds BMI:     24.34 Pulse rate:   81 / minute BP sitting:   144 / 78  (left arm) Cuff size:   regular  Vitals Entered By: Mignon Pine, RMA (February 21, 2010 12:17 PM)  Physical Exam  General:  elderly and somewhat frail appearing. no resp difficulty HEENT: normal Neck: supple. no JVD. Carotids 2+ bilat; no bruits. No lymphadenopathy or thryomegaly appreciated. Cor: PMI nondisplaced. Regular rate & rhythm. No rubs, gallops, murmur. Lungs: clear Abdomen: soft, nontender, nondistended. No hepatosplenomegaly. No bruits or masses. Good bowel sounds. Extremities: no cyanosis, clubbing,  rash, edema Neuro: alert & orientedx3, cranial nerves grossly intact. moves all 4 extremities w/o difficulty. affect pleasant    Impression & Recommendations:  Problem # 1:  CAD (ICD-414.00) Stable. Doubt symptoms are ischemic but given change in exercise capcity will proceed with Lexiscan Myoview to further evaluate.  Problem # 2:  HYPERLIPIDEMIA  (QJE-830.4) Lipids lookd very good. Continue with current therapy.   Other Orders: EKG w/ Interpretation (93000) Nuclear Stress Test (Nuc Stress Test)  Patient Instructions: 1)  Your physician recommends that you schedule a follow-up appointment in: 9 months 2)  Your physician recommends that you continue on your current medications as directed. Please refer to the Current Medication list given to you today. 3)  Your physician has requested that you have an adenosine myoview.  For further information please visit [email or web address].  Please follow instruction sheet, as given.

## 2010-05-10 NOTE — Miscellaneous (Signed)
Summary: rx  Clinical Lists Changes  Medications: Rx of PENTASA 250 MG CPCR (MESALAMINE) Take 2 capsule by mouth three times a day;  #180 x 11;  Signed;  Entered by: [name] MD;  Authorized by: [name] MD;  Method used: Electronically to [facility]*, [address] [place], [place]  [id number], Ph: [phone] or [phone], Fax: [phone]    Prescriptions: PENTASA 250 MG CPCR (MESALAMINE) Take 2 capsule by mouth three times a day  #180 x 11   Entered and Authorized by:   [name] MD   Signed by:   [name] MD on 03/16/2008   Method used:   Electronically to        [facility] (retail)       [address] [place], [place]  [id number]       Ph: [phone] or [phone]       Fax: [phone]   RxID:   [id number]

## 2010-05-10 NOTE — Letter (Signed)
Summary: [facility] Note  [facility] Note   Imported By: Sallee Provencal 11/11/2008 15:21:01  _____________________________________________________________________  External Attachment:    Type:   Image     Comment:   External Document

## 2010-05-10 NOTE — Letter (Signed)
Summary: Office Visit Letter  [facility]  [address] [place], [place] [address]   Phone: [phone]  Fax: [phone]      April 19, 2010 MRN: [id number]   [place], [place]  [address]   Dear Mr. Epler,   According to our records, it is time for you to schedule a follow-up office visit with [place].   At your convenience, please call [phone] (option #2)to schedule an office visit. If you have any questions, concerns, or feel that this letter is in error, we would appreciate your call.   Sincerely,  [name], M.D.  [facility] [phone]

## 2010-05-10 NOTE — Letter (Signed)
Summary: [facility] Office Note  [facility] Office Note   Imported By: Sallee Provencal 04/05/2009 13:40:01  _____________________________________________________________________  External Attachment:    Type:   Image     Comment:   External Document

## 2010-05-10 NOTE — Letter (Signed)
Summary: [facility] Office Note   [facility] Office Note   Imported By: Sallee Provencal 12/01/2009 11:05:24  _____________________________________________________________________  External Attachment:    Type:   Image     Comment:   External Document

## 2010-05-10 NOTE — Progress Notes (Signed)
Summary: Medication Update   Phone Note Call from Patient Call back at [phone]   Caller: Patient Call For: Dr. Ardis Hughs Reason for Call: Talk to Nurse Summary of Call: Pt is calilng to update [place] on medication and how it is working..you can call him back if you would like Initial call taken by: [name] Johnson,  March 23, 2010 10:20 AM  Follow-up for Phone Call        pt calling to report on condition and would like Dr Ardis Hughs to know that he thinks he is a Genius he feels great and will call with any further problems. Follow-up by: Christian Mate CMA Deborra Medina),  March 23, 2010 10:36 AM  Additional Follow-up for Phone Call Additional follow up Details #1::        i'm glad it's helping. Additional Follow-up by: [name] MD,  March 23, 2010 11:14 AM

## 2010-05-10 NOTE — Consult Note (Signed)
Summary: [facility]  [facility]   Imported By: Marilynne Drivers 02/13/2009 13:19:09  _____________________________________________________________________  External Attachment:    Type:   Image     Comment:   External Document

## 2010-05-10 NOTE — Letter (Signed)
Summary: Office Note / [place].  Office Note / [employer].   Imported By: Rise Patience 11/29/2008 13:56:26  _____________________________________________________________________  External Attachment:    Type:   Image     Comment:   External Document

## 2010-05-10 NOTE — Assessment & Plan Note (Signed)
History of Present Illness Visit Type: Follow-up Consult Primary GI MD: Owens Loffler MD Primary Provider:  Shon Baton, MD Requesting Provider: Shon Baton, MD Chief Complaint: Crohn's flare up x 3 weeks ago when pt was on his cruise. Pt had severe diarrhea 6-8 BM's qd x 1 week ago. Pt had some bloating but denies abd pain. History of Present Illness:   Patient is a 77 year old male followed by Dr. Ardis Hughs for history of Crohn's colitis.   GI PROBLEM LIST: 1. Crohn's disease.  Distant ileal and right colon resection by Dr. Gretta Cool in the very distant past.  He was maintained on Pentasa, Entocort, and 10m of Purinethol for several years under the care of Dr. SLyla Son  Hospitalization, May 2008, for acute myocardial infarction complicated by small bowel obstruction likely due to active Crohn's. Increased Purinethol to 100 mg daily and liver tests increased as well.  Purinethol held and liver test normalized.  The patient felt much better overall as well (less diarrhea, less fatigue).  Winter, 2009: currently on 6 pills of Pentasa day feeling quite well overall.  Summer, 2010 postoperative ileus following spine surgery (doubt active Crohn's flare).  Interim history: [place] Began having non-bloody diarrhea early into the trip. No lower abdominal pain. His diarrhea is resolving, getting back to baseline. Was also having upper abdomen burning when he ate. After returning home, saw Dr. RVirgina Jock given Reglan and has had complete resolution of symptoms.     GI Review of Systems    Reports bloating.      Denies abdominal pain, acid reflux, belching, chest pain, dysphagia with liquids, dysphagia with solids, heartburn, loss of appetite, nausea, vomiting, vomiting blood, weight loss, and  weight gain.        Denies anal fissure, black tarry stools, change in bowel habit, constipation, diarrhea, diverticulosis, fecal incontinence, heme positive stool,  hemorrhoids, irritable bowel syndrome, jaundice, light color stool, liver problems, rectal bleeding, and  rectal pain.    Current Medications (verified): 1)  Centrum Silver  Tabs (Multiple Vitamins-Minerals) .... One Tablet By Mouth Once Daily 2)  Pentasa 250 Mg Cpcr (Mesalamine) .... Take 2 Capsule By Mouth Three Times A Day 3)  Prevacid 30 Mg Cpdr (Lansoprazole) .... Take 1 Capsule By Mouth Once A Day 4)  Carvedilol 6.25 Mg  Tabs (Carvedilol) .... Two Times A Day 5)  Plavix 75 Mg  Tabs (Clopidogrel Bisulfate) .... Once Daily 6)  Lisinopril 10 Mg  Tabs (Lisinopril) .... Once Daily 7)  Aspirin 81 Mg  Tabs (Aspirin) .... One Tablet By Mouth Once Daily 8)  Lipitor 40 Mg Tabs (Atorvastatin Calcium) .... Once Daily 9)  Potassium 99 Mg Tabs (Potassium) .... One Tablet By Mouth Once Daily 10)  Nitroglycerin 0.4 Mg/hr Pt24 (Nitroglycerin) .... As Needed 11)  Tylenol Extra Strength 500 Mg Tabs (Acetaminophen) .... As Needed 12)  Calcium 600/vitamin D 600-400 Mg-Unit Tabs (Calcium Carbonate-Vitamin D) .... Once Daily 13)  Hydrocodone-Acetaminophen 5-500 Mg Tabs (Hydrocodone-Acetaminophen) .... As Needed 14)  Metoclopramide Hcl 5 Mg Tabs (Metoclopramide Hcl) .... One Tablet By Mouth Every 8 Hours As Needed 15)  Vitamin D3 400 Unit Tabs (Cholecalciferol) .... One Tablet By Mouth Once Daily 16)  Calcium Carbonate 600 Mg Tabs (Calcium Carbonate) .... One Tablet By Mouth Once Daily 17)  Align  Caps (Probiotic Product) .... One Capsule By Mouth Once Daily  Allergies (verified): No Known Drug Allergies  Past History:  Past  Medical History: Reviewed history from 02/06/2009 and no changes required. Current Problems:  FATTY LIVER DISEASE (ICD-571.8) EROSIVE ESOPHAGITIS (ICD-530.19) HIATAL HERNIA (ICD-553.3) ULCERATIVE ILEOCOLITIS (ICD-556.1) DIVERTICULOSIS, COLON (ICD-562.10) HEMORRHOIDS (ICD-455.6) GERD (ICD-530.81) HYPERLIPIDEMIA (ICD-272.4) HYPERTENSION (ICD-401.9) CAD (ICD-414.00)   --s/p LAD  stenting SMALL BOWEL OBSTRUCTION (ICD-560.9) CROHN'S DISEASE (ICD-555.9)  Past Surgical History: Reviewed history from 04/29/2007 and no changes required. right colon resection Dr. Gretta Cool acute myocardial infarction May 2008 angioplasty and bare-metal stenting of L[facility].  Family History: Reviewed history from 02/03/2009 and no changes required. Mother died at age 22 of heart failure  Father died at age 56 from emphysema.   He had 2 brothers that died with coronary artery disease, 1 at 75 and 1 at 33.   Social History: Reviewed history from 02/03/2009 and no changes required. Retired  Tobacco Use - No.  Alcohol Use - no Drug Use - no  Review of Systems  The patient denies allergy/sinus, anemia, anxiety-new, arthritis/joint pain, back pain, blood in urine, breast changes/lumps, change in vision, confusion, cough, coughing up blood, depression-new, fainting, fatigue, fever, headaches-new, hearing problems, heart murmur, heart rhythm changes, itching, menstrual pain, muscle pains/cramps, night sweats, nosebleeds, pregnancy symptoms, shortness of breath, skin rash, sleeping problems, sore throat, swelling of feet/legs, swollen lymph glands, thirst - excessive , urination - excessive , urination changes/pain, urine leakage, vision changes, and voice change.    Vital Signs:  Patient profile:   77 year old male Height:      70 inches Weight:      166 pounds BMI:     23.90 Pulse rate:   84 / minute Pulse rhythm:   regular BP sitting:   126 / 74  (left arm) Cuff size:   regular  Vitals Entered By: Marlon Pel CMA Deborra Medina) (October 20, 2009 8:45 AM)  Physical Exam  General:  Well developed, well nourished, no acute distress. Head:  Normocephalic and atraumatic. Eyes:  Conjunctiva pink, no icterus.  Mouth:  No oral lesions. Tongue moist.  Neck:  no obvious masses  Lungs:  Clear throughout to auscultation. Heart:  Regular rate and rhythm; no murmurs, rubs,  or  bruits. Abdomen:  Abdomen soft, nontender.No obvious masses or hepatomegaly.Normal bowel sounds. Abdomen is slightly distended, bulging flanks, flat to percussion.   Msk:  Symmetrical with no gross deformities. Normal posture. Extremities:  No palmar erythema, no edema.  Neurologic:  Alert and  oriented x4;  grossly normal neurologically. Skin:  No spider nevi Psych:  Alert and cooperative. Normal mood and affect.   Impression & Recommendations:  Problem # 1:  CROHN'S DISEASE-SMALL INTESTINE (ICD-555.0) Assessment Improved Recent bout of diarrhea while on cruise to [place]. May have been Crohn's flare but fairly rapid (and spontaneous) resolution of symptoms brings an infectious etiology etiology to mind. Diarrhea resolving, almost back to baseline. Continue current dose of Pentasa. Follow up with Dr. Ardis Hughs in six months, or sooner if needed.   Problem # 2:  ABDOMINAL PAIN-EPIGASTRIC (ICD-789.06) Assessment: Improved Upper abdominal burning while on cruise. PCP gave him Reglan with complete resolution of symptoms. No longer needs the Reglan. Symptoms may have been secondary to dyspepsia.   Problem # 3:  ABNORMAL FINDINGS GI TRACT (ICD-793.4) Abdomen is slightly distended despite normal bowel sounds. Has mildly bulging flanks, flat to percussion. Needs LFTs with history of Crohns. Will also add U/S abdomen.   Other Orders: Ultrasound Abdomen (UAS) TLB-Hepatic/Liver Function Pnl (80076-HEPATIC)  Patient Instructions: 1)  Your physician has requested  that you have the following labwork done today: 2)  We scheduled the Ultrasound for Tues 10-24-09.  3)  Directions provided. 4)  Continue the Pentasa. 5)  Make a follow up appointment with Dr. Ardis Hughs in 6 months. 6)  We will contact you with the labs and Ultrasound results. 7)  Copy sent to : Dr. Shon Baton 8)  The medication list was reviewed and reconciled.  All changed / newly prescribed medications were explained.  A complete  medication list was provided to the patient / caregiver.

## 2010-05-10 NOTE — Assessment & Plan Note (Signed)
  GI PROBLEM LIST: 1. Crohn's disease.  Distant ileal and right colon resection by Dr.     Gretta Cool in the very distant past.  He was maintained on     Pentasa, Anticort, and 65m of Purinethol for several years under     the care of Dr. SLyla Son  Hospitalization, May 2008, for acute     myocardial infarction complicated by small bowel obstruction likely     due to active Crohn's. 2. Increased Purinethol to 100 mg daily and liver tests increased as     well.  Purinethol held and liver test normalized.  The patient felt     much better overall as well (less diarrhea, less fatigue).     History of Present Illness Visit Type: follow up Primary GI MD: DOwens LofflerMD Primary Provider: JShon Baton MD Chief Complaint: follow-up visit/ History of Present Illness:   BMs 3-4 times a day, soft daily.  No frank diarrhea, no bleeding.  No abd pains.  his biggest complaint is left-sided sciatica which is worse when he sits down. He is schedule himself to see his back surgeon Dr. ELoanne Drillingin the near future.  he has had no other arthritis symptoms, no signs of erythema nodosum, nothing similar to pinkeye.   GI Review of Systems      Denies abdominal pain.        Denies constipation and  diarrhea.     Prior Medications Reviewed Using: List Brought by Patient  Updated Prior Medication List: V-C FORTE   CAPS (MULTIPLE VITAMINS-MINERALS) 1 once daily PENTASA 250 MG CPCR (MESALAMINE) Take 2 capsule by mouth three times a day PREVACID 30 MG CPDR (LANSOPRAZOLE) Take 1 capsule by mouth once a day CARVEDILOL 6.25 MG  TABS (CARVEDILOL) take 2 once daily PLAVIX 75 MG  TABS (CLOPIDOGREL BISULFATE) once daily LISINOPRIL 10 MG  TABS (LISINOPRIL) once daily BAYER ASPIRIN 325 MG  TABS (ASPIRIN) once daily  Current Allergies: No known allergies       Vital Signs:  Patient Profile:   77Years Old Male Height:     70 inches Weight:      158.25 pounds BMI:     22.79 Pulse rate:   88 /  minute BP sitting:   110 / 62  (left arm)                  Physical Exam  Constitutional: generally well appearing Psychiatric: alert and oriented times 3 Abdomen: soft, non-tender, non-distended, normal bowel sounds     Impression & Recommendations:  Problem # 1:  CROHN'S DISEASE (ICD-555.9) Doing very well currently on Pentasa 6 pills daily. As I said before thisis not usually very effective for small bowel Crohn's but it has been helping him for some time.  he has 3-4 soft formed bowel movements a day and he is comfortable with this. There is no signs of active Crohn's. He'll return to see me in 6 months and sooner if needed.  previously Dr. LVelora Hecklerrecommended that he have a colonoscopy 3 years from his 2006 colonoscopy. I do not think it is necessary needed and will instead to see him in the office.   Patient Instructions: 1)  A copy of this information will be sent to Dr. RVirgina Jock 2)  Stay on the pentasa daily. 3)  follow up in 6 months.    ]

## 2010-05-10 NOTE — Procedures (Signed)
Summary: Gastroenterology colon  Gastroenterology colon   Imported By: Bernita Buffy CMA 04/29/2007 14:37:01  _____________________________________________________________________  External Attachment:    Type:   Image     Comment:   External Document

## 2010-05-23 ENCOUNTER — Ambulatory Visit (INDEPENDENT_AMBULATORY_CARE_PROVIDER_SITE_OTHER): Payer: Medicare Other | Admitting: Gastroenterology

## 2010-05-23 ENCOUNTER — Encounter: Payer: Self-pay | Admitting: Gastroenterology

## 2010-05-23 DIAGNOSIS — R040 Epistaxis: Secondary | ICD-10-CM | POA: Insufficient documentation

## 2010-05-23 DIAGNOSIS — K5090 Crohn's disease, unspecified, without complications: Secondary | ICD-10-CM

## 2010-05-30 NOTE — Assessment & Plan Note (Signed)
GI PROBLEM LIST: 1. Crohn's disease.  Distant ileal and right colon resection by Dr. Gretta Cool in the very distant past.  He was maintained on Pentasa, Entocort, and 62m of Purinethol for several years under the care of Dr. SLyla Son  Hospitalization, May 2008, for acute myocardial infarction complicated by small bowel obstruction likely due to active Crohn's. Increased Purinethol to 100 mg daily and liver tests increased as well.  Purinethol held and liver test normalized.  The patient felt much better overall as well (less diarrhea, less fatigue).  Winter, 2009: currently on 6 pills of Pentasa day feeling quite well overall.  Summer, 2010 postoperative ileus following spine surgery (doubt active Crohn's flare).  November, 2011:  started cholestyramine with very good results (loose stools MUCH improved, only going 3-4 times a day)    Vital Signs:  Patient profile:   77year old male Height:      70 inches Weight:      173 pounds BMI:     24.91 Pulse rate:   80 / minute Pulse rhythm:   regular BP sitting:   132 / 76  (left arm) Cuff size:   regular  Vitals Entered By: AMarlon PelCMA (Deborra Medina (May 23, 2010 8:39 AM)  History of Present Illness Visit Type: Follow-up Visit Primary GI MD: DOwens LofflerMD Primary Provider: JShon Baton MD  Requesting Provider: na Chief Complaint: f/u Crohn's disease, pt denies any GI sx. Pt has been doing really well with Cholestyramine powder. History of Present Illness:       very pleasant 77year old man whom I last saw about 3 months ago. He was still having several loose stools a day and cholestyramine powder has really helped that.  he is very happy about stooling since starting cholestyramine.    he mentioned that he has been having chronic left sided nosebleeds about once weekly.          Current Medications (verified): 1)  Centrum Silver  Tabs (Multiple Vitamins-Minerals) .... One Tablet By Mouth Once Daily 2)  Pentasa 250 Mg  Cpcr (Mesalamine) .... Take 2 Capsule By Mouth Three Times A Day 3)  Prevacid 30 Mg Cpdr (Lansoprazole) .... Take 1 Capsule By Mouth Once A Day 4)  Carvedilol 6.25 Mg  Tabs (Carvedilol) .... Two Times A Day 5)  Plavix 75 Mg  Tabs (Clopidogrel Bisulfate) .... Once Daily 6)  Lisinopril 10 Mg  Tabs (Lisinopril) .... Once Daily 7)  Aspirin 81 Mg  Tabs (Aspirin) .... One Tablet By Mouth Once Daily 8)  Lipitor 40 Mg Tabs (Atorvastatin Calcium) .... Once Daily 9)  Potassium 99 Mg Tabs (Potassium) .... One Tablet By Mouth Once Daily 10)  Nitroglycerin 0.4 Mg/hr Pt24 (Nitroglycerin) .... As Needed 11)  Tylenol Extra Strength 500 Mg Tabs (Acetaminophen) .... As Needed 12)  Calcium 600/vitamin D 600-400 Mg-Unit Tabs (Calcium Carbonate-Vitamin D) .... Once Daily 13)  Vitamin D3 400 Unit Tabs (Cholecalciferol) .... One Tablet By Mouth Once Daily 14)  Cholestyramine   Powd (Cholestyramine) .... Take 4gram of Powder, Once Daily  Allergies (verified): No Known Drug Allergies  Physical Exam  Additional Exam:  Constitutional: generally well appearing Psychiatric: alert and oriented times 3 Abdomen: soft, non-tender, non-distended, normal bowel sounds    Impression & Recommendations:  Problem # 1:  Crohn's disease he is on a stable regimen of mesalamine and cholestyramine. He will continue on this indefinitely. He will return to see me in one year and sooner if needed.  Problem #  2:  left-sided nosebleeds I'm sure that Plavix contributes to his nosebleeds. We will refer him to ear nose and throat for their input.  Patient Instructions: 1)  ENT referral for chronic left sided nose bleeding. 2)  Stay on pentasa as currently scheduled. 3)  Return to see Dr. Ardis Hughs in 1 year, sooner if needed. 4)  The medication list was reviewed and reconciled.  All changed / newly prescribed medications were explained.  A complete medication list was provided to the patient / caregiver.  Appended Document: Orders  Update/ENT    Clinical Lists Changes  Problems: Added new problem of NOSEBLEED (ICD-784.7) Orders: Added new Test order of [facility] ([facility]) - Signed

## 2010-06-05 NOTE — Consult Note (Signed)
Summary: [facility]  [facility]   Imported By: Rise Patience 05/29/2010 14:12:26  _____________________________________________________________________  External Attachment:    Type:   Image     Comment:   External Document

## 2010-07-17 LAB — BASIC METABOLIC PANEL
BUN: 12 mg/dL (ref 6–23)
BUN: 16 mg/dL (ref 6–23)
BUN: 13 mg/dL (ref 6–23)
CO2: 27 mEq/L (ref 19–32)
CO2: 32 mEq/L (ref 19–32)
CO2: 27 mEq/L (ref 19–32)
Calcium: 7.9 mg/dL — ABNORMAL LOW (ref 8.4–10.5)
Calcium: 8.2 mg/dL — ABNORMAL LOW (ref 8.4–10.5)
Calcium: 9.7 mg/dL (ref 8.4–10.5)
Chloride: 101 mEq/L (ref 96–112)
Chloride: 104 mEq/L (ref 96–112)
Chloride: 105 mEq/L (ref 96–112)
Creatinine, Ser: 0.88 mg/dL (ref 0.4–1.5)
Creatinine, Ser: 0.97 mg/dL (ref 0.4–1.5)
Creatinine, Ser: 1 mg/dL (ref 0.4–1.5)
GFR calc Af Amer: >60 mL/min (ref >60)
GFR calc Af Amer: >60 mL/min (ref >60)
GFR calc Af Amer: >60 mL/min (ref >60)
GFR calc non Af Amer: >60 mL/min (ref >60)
GFR calc non Af Amer: >60 mL/min (ref >60)
GFR calc non Af Amer: >60 mL/min (ref >60)
Glucose, Bld: 135 mg/dL — ABNORMAL HIGH (ref 70–99)
Glucose, Bld: 143 mg/dL — ABNORMAL HIGH (ref 70–99)
Glucose, Bld: 106 mg/dL — ABNORMAL HIGH (ref 70–99)
Potassium: 3.7 mEq/L (ref 3.5–5.1)
Potassium: 4 mEq/L (ref 3.5–5.1)
Potassium: 4.9 mEq/L (ref 3.5–5.1)
Sodium: 136 mEq/L (ref 135–145)
Sodium: 137 mEq/L (ref 135–145)
Sodium: 139 mEq/L (ref 135–145)

## 2010-07-17 LAB — CBC
HCT: 30.3 % — ABNORMAL LOW (ref 39.0–52.0)
HCT: 35.8 % — ABNORMAL LOW (ref 39.0–52.0)
HCT: 41.5 % (ref 39.0–52.0)
Hemoglobin: 10.5 g/dL — ABNORMAL LOW (ref 13.0–17.0)
Hemoglobin: 12.4 g/dL — ABNORMAL LOW (ref 13.0–17.0)
Hemoglobin: 14.2 g/dL (ref 13.0–17.0)
MCHC: 34.6 g/dL (ref 30.0–36.0)
MCHC: 34.7 g/dL (ref 30.0–36.0)
MCHC: 34.1 g/dL (ref 30.0–36.0)
MCV: 94.9 fL (ref 78.0–100.0)
MCV: 95.7 fL (ref 78.0–100.0)
MCV: 94.6 fL (ref 78.0–100.0)
Platelets: 101 10*3/uL — ABNORMAL LOW (ref 150–400)
Platelets: 149 10*3/uL — ABNORMAL LOW (ref 150–400)
Platelets: 198 10*3/uL (ref 150–400)
RBC: 3.16 MIL/uL — ABNORMAL LOW (ref 4.22–5.81)
RBC: 3.78 MIL/uL — ABNORMAL LOW (ref 4.22–5.81)
RBC: 4.39 MIL/uL (ref 4.22–5.81)
RDW: 13.2 % (ref 11.5–15.5)
RDW: 13.8 % (ref 11.5–15.5)
RDW: 13.6 % (ref 11.5–15.5)
WBC: 15 10*3/uL — ABNORMAL HIGH (ref 4.0–10.5)
WBC: 8.2 10*3/uL (ref 4.0–10.5)
WBC: 7.7 10*3/uL (ref 4.0–10.5)

## 2010-07-17 LAB — POCT I-STAT 7, (LYTES, BLD GAS, ICA,H+H)
Acid-Base Excess: 1 mmol/L (ref 0.0–2.0)
Bicarbonate: 25.8 mEq/L — ABNORMAL HIGH (ref 20.0–24.0)
Calcium, Ion: 1.16 mmol/L (ref 1.12–1.32)
HCT: 30 % — ABNORMAL LOW (ref 39.0–52.0)
Hemoglobin: 10.2 g/dL — ABNORMAL LOW (ref 13.0–17.0)
O2 Saturation: 100 %
Patient temperature: 35.8
Potassium: 3.7 mEq/L (ref 3.5–5.1)
Sodium: 136 mEq/L (ref 135–145)
TCO2: 27 mmol/L (ref 0–100)
pCO2 arterial: 37 mmHg (ref 35.0–45.0)
pH, Arterial: 7.446 (ref 7.350–7.450)
pO2, Arterial: 460 mmHg — ABNORMAL HIGH (ref 80.0–100.0)

## 2010-07-17 LAB — RENAL FUNCTION PANEL
Albumin: 2.5 g/dL — ABNORMAL LOW (ref 3.5–5.2)
BUN: 22 mg/dL (ref 6–23)
CO2: 28 mEq/L (ref 19–32)
Calcium: 8 mg/dL — ABNORMAL LOW (ref 8.4–10.5)
Chloride: 104 mEq/L (ref 96–112)
Creatinine, Ser: 0.81 mg/dL (ref 0.4–1.5)
GFR calc Af Amer: >60 mL/min (ref >60)
GFR calc non Af Amer: >60 mL/min (ref >60)
Glucose, Bld: 109 mg/dL — ABNORMAL HIGH (ref 70–99)
Phosphorus: 3.5 mg/dL (ref 2.3–4.6)
Potassium: 3.7 mEq/L (ref 3.5–5.1)
Sodium: 140 mEq/L (ref 135–145)

## 2010-07-17 LAB — TYPE AND SCREEN
ABO/RH(D): O POS
Antibody Screen: NEGATIVE

## 2010-07-17 LAB — C-REACTIVE PROTEIN: CRP: 10.2 mg/dL — ABNORMAL HIGH (ref <0.6)

## 2010-07-17 LAB — ABO/RH: ABO/RH(D): O POS

## 2010-07-17 LAB — SEDIMENTATION RATE: Sed Rate: 67 mm/hr — ABNORMAL HIGH (ref 0–16)

## 2010-08-21 NOTE — Assessment & Plan Note (Signed)
[place] OFFICE NOTE   SHAHAN, STARKS                       MRN:          [id number]  DATE:03/03/2007                            DOB:          [birth year]    PRIMARY CARE PHYSICIAN:  [name], MD.   GASTROENTEROLOGIST:  [name], MD.   INTERVAL HISTORY:  Mr. Strollo is a delightful 77 year old male with a  history of coronary artery disease, status post anterior ST elevation  myocardial infarction earlier this year.  Treated with angioplasty and  bare-metal stenting of the LAD at [facility].  Ejection fraction initially  was 35-40%, but most recently 60%.  He also has a history of  hypertension, hyperlipidemia, Crohn's disease as well as  gastroesophageal reflux disease.  He returns today for routine followup.   He has been doing great.  He is exercising at the [employer] 4-5 days a week without any difficulty.  No chest pain.  No  shortness of breath.  No lower extremity edema.  No orthopnea.  No PND.  His colitis has been under good control.   CURRENT MEDICATIONS:  1. Lisinopril 10 mg a day.  2. Prevacid 30 a day.  3. Plavix 75 a day.  4. Aspirin 325 a day.  5. Potassium 40 a day.  6. Lipitor 40 a day.  7. Pentasa 2 tablets t.i.d.  8. Coreg 3.125 b.i.d.   PHYSICAL EXAMINATION:  GENERAL:  He is well-appearing in no acute  distress.  He ambulates around the clinic without any respiratory  difficulty.  VITAL SIGNS:  Blood pressure 118/76, heart rate 69, weight 154.  HEENT:  Normal.  NECK:  Supple.  No JVD.  Carotids are 2+ bilaterally without any bruits.  There is no lymphadenopathy or thyromegaly.  CARDIAC:  PMI is nondisplaced.  He has a regular rate and rhythm.  No  murmurs, rubs or gallops.  LUNGS:  Clear.  ABDOMEN:  Soft, nontender and nondistended.  No hepatosplenomegaly.  No  bruits.  No masses.  Good bowel sounds.  EXTREMITIES:  Warm with no clubbing, cyanosis, or  edema.  No rash.  NEURO:  He is alert and oriented x three.  Cranial nerves II-XII are  intact.  He moves all four extremities without difficulty.  Affect is  pleasant.   ASSESSMENT/PLAN:  1. Coronary artery disease s/p anterior MI.  He is doing well.  No      evidence of ischemia.  Ejection fraction is normal.  He is on a      good medical regimen and will continue.  2. Hypertension, well controlled.  3. Hyperlipidemia.  He is followed by Dr. Virgina Jock.  Given his coronary      artery disease, goal LDL is less than 70.   DISPOSITION:  We will see him back in 6 months for routine followup.     Shaune Pascal. Bensimhon, MD  Electronically Signed    DRB/MedQ  DD: 03/03/2007  DT: 03/03/2007  Job #: [id number]

## 2010-08-21 NOTE — Assessment & Plan Note (Signed)
[place] OFFICE NOTE   [name] [name], [name] [name]                       MRN:          [id number]  DATE:09/25/2006                            DOB:          1928-09-04    PRIMARY CARE PHYSICIAN:  Dr. Shon Baton.   INTERVAL HISTORY:  [name] [name] is a delightful 77 year old male who  recently suffered an anterior ST elevation myocardial infarction.  He  underwent angioplasty with bare metal stenting of the LAD at [facility].  His post catheterization course was complicated by contrast induced  nephropathy with a creatinine in the 3 range as well as partial small  bowel obstruction in the setting of his known history of Crohn's disease  and previous bowel resection.  Ejection fraction at the time of his  catheterization was 35% - 40%.  His RCA and circumflex did not have any  significant coronary disease.  The remainder of his medical history is  notable for hypertension, hyperlipidemia, and gastroesophageal reflux  disease.   He returns today with his wife for routine followup.  He is doing great,  he is going to cardiac rehab without any chest pain or shortness of  breath.  He does continue to have a mild amount of diarrhea but has not  noticed any bloody stools.  He has not had any heart failure symptoms.   CURRENT MEDICATIONS:  1. Entocort 9 mg a day.  2. Hyoscyamine.  3. Lisinopril 10 a day.  4. Prevacid 30 a day.  5. Alphia Moh.  6. Mercaptopurine 100 mg a day.  7. Prednisone.  8. Carvedilol 6.25 b.i.d.  9. Plavix 75 a day.  10.Aspirin 325.  11.Potassium 20.  12.Lipitor 40 a day.   PHYSICAL EXAMINATION:  He is well-appearing, ambulates around the clinic  without any respiratory difficulty.  Blood pressure is 122/68, heart  rate is 88, weight is 149.  HEENT:  Normal.  NECK:  Supple, there is no JVD, carotids are 2+ bilaterally without any  bruits, there is no lymphadenopathy or thyromegaly.  CARDIAC:  His PMI  is normal, he has a regular rate and rhythm with no  murmurs, rubs, or gallops.  LUNGS:  Clear.  ABDOMEN:  Soft, nontender, nondistended, there is no hepatosplenomegaly,  no bruits, no masses, good bowel sounds.  EXTREMITIES:  Warm with no cyanosis, clubbing, or edema; good distal  pulses.  NEURO:  Alert and oriented x3, cranial nerves II through XII are intact,  moves all 4 extremities without difficulty.  Affect is pleasant.   EKG shows normal sinus rhythm at a rate of 94, there is some T wave  inversion in V2, otherwise no ST-T wave abnormalities.   LDL is 49 with a hemoglobin of 12.5, creatinine now is 0.9.   ASSESSMENT/PLAN:  1. Coronary artery disease status post recent anterior wall ST      elevation myocardial infarction.  He is doing well, we will      continue his current medical regimen.  We will get an      echocardiogram to reassess his ejection fraction, I  suspect it is      probably close to normal if not normal.  He will proceed with      cardiac rehab.  2. Hypertension.  Blood pressure is well controlled here but he has      had some high readings given his tachycardia.  We will increase his      Coreg to 12.5 b.i.d.  3. Hyperlipidemia.  Well controlled, LDL is less than 70.  4. Vascular screening.  We will go ahead and get carotid ultrasound to      evaluate for atherosclerotic disease.   DISPOSITION:  Will see him back in several months for a routine  followup.     Shaune Pascal. Bensimhon, MD  Electronically Signed    DRB/MedQ  DD: 09/25/2006  DT: 09/25/2006  Job #: [id number]   cc:   [name], MD

## 2010-08-21 NOTE — Consult Note (Signed)
NAMEKALEB, SEK NO.:  [id number]   MEDICAL RECORD NO.:  [id number]          PATIENT TYPE:  INP   LOCATION:  6599                         FACILITY:  [facility]   PHYSICIAN:  Gwenyth Ober, M.D.    DATE OF BIRTH:  1928/11/16   DATE OF CONSULTATION:  08/31/2006  DATE OF DISCHARGE:                                 CONSULTATION   Dear Dr. Ardis Hughs:   Thank you very much for asking me to see [name], a very nice  77 year old gentleman with a history of Crohn's disease requiring an  ileal and right colon resection by Dr. Gretta Cool many years ago, who  now comes in with a small-bowel obstruction after a recent  hospitalization at [facility].   The patient had been in [place], [place], for a grandson's  graduation.  He flew back into [place] and, while getting  his baggage, developed some chest pain associated with diaphoresis; 9-1-  1 emergency medical services were called.  The patient was taken to [facility] where he was found to have an acute MI.  He subsequently  underwent a cardiac intervention including at least one single stent and  was placed on aspirin and Plavix at that time.  He did pretty well until  Wednesday of this week when he developed some nausea, vomiting and  abdominal distension and pain.  Because he lives in the [place] area,  they requested to be transferred back here where most of his physicians.  That was done on Thursday.  At that time, an NG tube was placed, and a  large amount of feculent aspirate was obtained.  GI consultation was  obtained because of his history of Crohn's. He has been maintained by  Dr. Lyla Son and has done very well until this acute exacerbation  surrounding his acute MI.  Some of his medications may have been held  during the process of being treated because of his MI and maybe  interactions with cardiac medications.   X-rays demonstrated at least a partial high-grade  small-bowel  obstruction.  A CT scan confirms that with the possibility of the point  of obstruction being in the right lower quadrant.  With these findings,  a surgical consultation was obtained.   PAST MEDICAL HISTORY:  Significant for:  1. Crohn's disease managed by Dr. Lyla Son.  2. Recent myocardial infarction less than a week ago, treated      primarily at [facility] and now with maintenance of followup      treatment at [facility].  3. History of hypertension.  4. History of hyperlipidemia.   CURRENT MEDICATIONS:  Include aspirin, Plavix.  He had been on 6-  mercaptopurine; however, that has been stopped.  He is on Lipitor,  Coreg, hyoscyamine, Pentasa, Protonix and lisinopril.   ALLERGIES:  He has no known drug allergies.   SURGICAL HISTORY:  Significant for:  1. Appendectomy.  2. Cholecystectomy.  3. Terminal ileum resection.  4. Right colectomy.   PHYSICAL EXAMINATION:  VITAL SIGNS:  He is afebrile.  His blood pressure  and pulse are normal.  GENERAL:  He is not in any acute distress.  I had pleasant conversation  with him and his wife.  He has an NG tube in place which is currently  clamped for him to get Plavix and Coreg.  HEENT:  He is normocephalic and atraumatic.  NECK:  Supple.  CHEST:  Clear to auscultation.  CARDIAC:  No murmurs, gallops, rubs, or heaves.  ABDOMEN:  Distended but very soft.  He has minimal tenderness.  He has a  few bowel sounds but no tinkles and rushes.  RECTAL:  Exam was not performed.   LABORATORY STUDIES:  Hemoglobin 11.5, hematocrit 33.  His only abnormal  liver function test was a total bilirubin of 1.4 which is improved. His  albumin is down to 2.3.   He had an acute episode of renal failure while he was at rest.  His BUN  and creatinine were last 37 and 1.1, respectively, which is markedly  improved.  His potassium is 3.3.   His x-rays show a partial small-bowel obstruction along with CT.   IMPRESSION:  1. He had a  high-grade partial small-bowel obstruction with history of      Crohn's disease, and this could be an exacerbation of his Crohn's      or could be adhesions which will not resolve over time impossibly.  2. He also has a history of recent myocardial infarction with stent      placement, now on Plavix and aspirin.  3. Mild anemia.  4. Acute renal insufficiency which appears be resolving.   PLAN:  My plan is to be very conservative in the management of this  patient's small-bowel obstruction, although this may be mechanical from  adhesions, it could also the mechanical from swelling around his  previous anastomotic site, possibly secondary to not getting his Crohn's  medications, possibly just secondary to stress of his recent MI.  Either  way, I think that conservative management is believed considering the  risk of perioperative complications secondary to his acute MI and also  being on anticoagulants, Plavix and aspirin, although it may not have  been well absorbed with his recent bowel obstruction.   Because he does need to be anticoagulated because of recent stent and  there is question whether or not he is absorbing his aspirin and Plavix,  it may be better for the patient to be on IV heparin until the decision  has been made whether or not this will require surgical intervention.  If he is on heparin and he does require surgery, we can stop the heparin  at least 4 hours prior to surgery to decrease his intraoperative  bleeding diaphysis.  Even so, the risk from the recent MI is still  significant, and we would prefer not to operate unless necessary because  he has been n.p.o. for  awhile and he may not be getting his NG tube out for some time, I think  starting him on TNA would be the proper thing at this time.  Even if we  do not use it for a long period of time, we will not be get behind nutritionally if we start him on TNA now and he ends up having an NG  tube for several  weeks.      Gwenyth Ober, M.D.  Electronically Signed     JOW/MEDQ  D:  08/31/2006  T:  08/31/2006  Job:  [id number]   cc:   Denice Bors.  Stanford Breed, MD, [facility]  [name], MD

## 2010-08-21 NOTE — Assessment & Plan Note (Signed)
[name] OFFICE NOTE   [name] [name], [name] [name]                       MRN:          [id number]  DATE:03/10/2007                            DOB:          25-Aug-1928    PRIMARY CARE PHYSICIAN:  Dr. Shon Baton.   CARDIOLOGIST:  Dr. Pierre Bali.   GI PROBLEM LIST:  1. Crohn's disease.  Distant ileal and right colon resection by Dr.      Gretta Cool in the very distant past.  He was maintained on      Pentasa, Anticort, and 65m of Purinethol for several years under      the care of Dr. SLyla Son  Hospitalization, May 2008, for acute      myocardial infarction complicated by small bowel obstruction likely      due to active Crohn's.  2. Increased Purinethol to 100 mg daily and liver tests increased as      well.  Purinethol held and liver test normalized.  The patient felt      much better overall as well (less diarrhea, less fatigue).   INTERVAL HISTORY:  I last saw [name] weeks ago.  Since then, he has  felt great.  He has 3 to 4 soft stools daily.  No bleeding.  He says  he feels the best that he has felt in years.  He has no abdominal pain.  No nausea or vomiting.   CURRENT MEDICATIONS:  1. Lisinopril.  2. Prevacid.  3. Vitacon.  4. Plavix.  5. Aspirin.  6. Lipitor.  7. Potassium.  8. Pentasa 2 pills t.i.[name].  9. Coreg.   PHYSICAL EXAM:  Weight 155 pounds, which is up 4 pounds since his last  visit.  Blood pressure 118/68, pulse 88.  CONSTITUTIONAL:  Generally well-appearing.  NEUROLOGIC:  Alert and oriented x3.  EYES:  Extraocular movements intact.  MOUTH:  Oropharynx moist.  No lesions.  NECK:  Supple.  No lymphadenopathy.  CARDIOVASCULAR:  Regular rate and rhythm.  ABDOMEN:  Soft, nontender, nondistended.  Normal bowel sounds.   ASSESSMENT AND PLAN:  A 77year old male with a long history of Crohn's  disease, small bowel obstruction spring 2008.   [name] great off of  6-Mercaptopurine, off of Entocort.  He is  only on Pentasa for inflammatory bowel disease, which has marginal  effect on small bowel Crohn's.  I see no reason to change any of his  medicines.  He knows to call if he has any new developments.  He will  return to see me in 6 months.  Sooner if needed.     DMilus Banister MD  Electronically Signed    DPJ/MedQ  DD: 03/10/2007  DT: 03/10/2007  Job #: [id number]  cc:   [name] Reel MD  DShaune Pascal Bensimhon, MD

## 2010-08-21 NOTE — Assessment & Plan Note (Signed)
[facility] OFFICE NOTE   DELQUAN, POUCHER                       MRN:          [id number]  DATE:01/20/2007                            DOB:          [birth year]    PRIMARY CARE PHYSICIAN:  Dr. Shon Baton.   CARDIOLOGIST:  Dr. Pierre Bali.   GI PROBLEM LIST:  1. Crohn's disease.  Distant ileal and right colon resection by Dr.      Gretta Cool in the very distant past.  He was maintained on      Pentasa, Anticort, and 77m of Purinethol for several years under      the care of Dr. SLyla Son  Hospitalization, May 2008, for acute      myocardial infarction complicated by small bowel obstruction likely      due to active Crohn's.  2. Increased Purinethol to 100 mg daily and liver tests increased as      well.  Purinethol held and liver test normalized.  The patient felt      much better overall as well (less diarrhea, less fatigue).   INTERVAL HISTORY:  I last saw Mr. BBergerabout 2 months ago.  We decided  to completely hold his 6-MP, as he had been feeling well off of it for 3  to 4 weeks.  He has done this and he has not been back on his 6-MP for  about 2 months, and says that he really feels great.  He has not had  daily diarrhea.  He has had 3 bouts of diarrhea, each was limited to 1  night, 5 to 6 non-bloody stools only.  Besides those 3 nights, for the  past 2 months, he has felt very well having 1 to 2 soft, formed stools a  day.  He took some Pepto-Bismol with his most recent episode.  He has  had a mild left lower quadrant ache for the past week or so.  No fevers  or chills.  No nausea or vomiting.  He is eating well.  He [place]  [place].  He actually thinks he gained some weight.   CURRENT MEDICATIONS:  1. Lisinopril.  2. Prevacid.  3. Vitacon Forte.  4. Plavix.  5. Aspirin.  6. Potassium chloride.  7. Lipitor.  8. Pentasa 2 pills t.i.d.  9. Coreg.   PHYSICAL EXAM:   Weight 151 pounds, which is up 1 pound since his last  visit.  Blood pressure 110/60, pulse 74.  CONSTITUTIONAL:  Generally well-appearing.  NEUROLOGIC:  Alert and oriented x3.  ABDOMEN:  Soft.  Very mildly tender in the left lower quadrant.  Normal  bowel sounds.  No ascites.  No peritoneal signs.   ASSESSMENT AND PLAN:  A 77year old man with a long history of Crohn's  disease, small bowel obstruction in spring of 2008.   After 2 months off of 6-MP, he is actually doing better than when he was  on the medicine.  He normally does not have diarrhea, as he was having 5  to 6 loose stools a day while on  Purinethol.  His liver tests have  completely normalized.  He does have some left lower quadrant  discomfort, but is not very tender on examination.  He will simply  return to see me in 3 to 4 weeks, sooner if needed.  I am not convinced  that these 3 episodes of diarrhea were flares of his Crohn's disease.  It may simply bee some kind of dietary intolerance.     [name], MD  Electronically Signed    DPJ/MedQ  DD: 01/20/2007  DT: 01/21/2007  Job #: [id number]   cc:   [name], MD  Shaune Pascal. Bensimhon, MD

## 2010-08-21 NOTE — Op Note (Signed)
NAMEJOBIE, POPP NO.:  [id number]   MEDICAL RECORD NO.:  [id number]          PATIENT TYPE:  INP   LOCATION:  3109                         FACILITY:  [facility]   PHYSICIAN:  Earleen Newport, M.D.  DATE OF BIRTH:  05-26-1928   DATE OF PROCEDURE:  08/23/2008  DATE OF DISCHARGE:                               OPERATIVE REPORT   PREOPERATIVE DIAGNOSES:  Lumbar spondylosis and stenosis L3-4 and L5-S1  status post arthrodesis L4-L5, lumbar radiculopathy.   POSTOPERATIVE DIAGNOSES:  Lumbar spondylosis and stenosis L3-4 and L5-S1  status post arthrodesis L4-L5, lumbar radiculopathy.   OPERATIONS:  Laminectomy of L3, L4, and L5; decompress the L3, the L4,  the L5, and the S1 nerve roots bilaterally; post diskectomy L3-4 and L5-  S1 with posterior interbody arthrodesis using PEEK spacers, local  autograft and allograft L3-4 and L5-S1; segmental fixation L3, L4, L5  and the sacrum with posterolateral arthrodesis using local autograft and  allograft L3 to the sacrum, Infuse.   SURGEON:  Earleen Newport, MD   FIRST ASSISTANT:  Hosie Spangle, MD   ANESTHESIA:  General endotracheal.   INDICATIONS:  [name] is a 77 year old individual who has had  significant problems with back and bilateral lower extremity pain.  He  was previously treated in 1997 with posterior decompression at L4-L5  where he had degenerative spondylolisthesis, underwent an arthrodesis  using a Ray cage technique and tolerated activity well until the past  year's time when he started progressively developing increasing back  pain with leg pain.  He has evidence of spondylitic stenosis at L3-4 and  again at L5-S1, particularly on the left side, where he has severe  entrapment of the left L5 nerve root.  He is now taken to the operating  room to undergo decompression at L3-4, L4-5, and L5-S1 with fusion.  Post procedure, the patient was brought to the operating room supine on  the stretcher.   After smooth induction of general endotracheal  anesthesia, he was turned prone.  The back was prepped with alcohol and  DuraPrep and draped in a sterile fashion.  A vertical incision was made  through the old incision and extending it a little bit above and below.  Dissection was carried down to the spinous processes and spinous  processes at L4-5 were identified easily and then L5 and S1 were also  identified by immobilizing these various spinous processes.  The last  joint was noted as L5-S1 and then after adequately exposing this and  packing off over the facet joints and into the transverse processes from  the sacrum of the ala to the transverse process of L3.  Laminectomy was  performed.  The laminar arch of L5 was removed followed by the laminar  arch of L4 and then the bilateral inferior portion of the facets of L3  were removed.  Similarly, we decompressed the common dural tube and the  lateral recesses.  The disks were noted to be markedly degenerated at L5-  S1 and L3-L4.  The L4-5 appeared to be fused.  However, there was  gross  motion between L4 and L5 as noted by rocking movement of the posterior  spinal elements.  Because there was a pseudoarthrosis then, it was felt  that he would also require fixation and fusion of the L4-L5 segment.  For that reason, then the dissection was carried out to include pedicle  entry sites for L3, L4, L5, and S1.  Once these were carefully  identified, I proceeded with decompressing the nerve roots individually  using a Kerrison punch and carefully dissecting along the path of the L3  nerve root first and dissecting this area into the extraforaminal space.  Disk space at L3-4 was dissected out and was noted to be significantly  bulging posteriorly, L4-5.  I had the nerve roots dissected out for L4  and L5 and then the disk space at L5-S1 was similarly dissected.  Diskectomies were then performed at L3-4 and L5-S1, care being taken to  protect  the common dural tube and the neural elements, and the complete  diskectomy was then performed with a series of dilators being placed in  the interspace.  Bone cutters were also used to remove disk endplate  material at T9-Q3.  At L3-4, it was felt that a 10-mm spacer would be  able to be placed and this was used as a temporary placement.  The  opposite side with the distraction in place was then opened and a  diskectomy was performed using a series of curettes, rongeurs, and  ultimately disk shavers and shapers.  Once the disk endplates were  prepared by removal of all the bone and cartilaginous endplate material,  an implant size was chosen and it was felt that a 10-mm spacer would fit  best at the L3-4 level.  At L5-S1, the space was prepared in a similar  fashion.  Here, it was felt that an 8-mm spacer would fit best.  These  were packed with autologous bone in addition to some Infuse.  After 1  spacer was placed on one side, the center portion of the disk space was  filled with autologous bone graft and then the opposite spacer was  placed on the far end.  With the spacers being placed securely, a  fluoroscope was brought into the room and fluoroscopic guidance was used  to place pedicle screws at L3, L4, L5, and the sacrum.  These were  placed bilaterally.  The lateral gutters were then decorticated from L3  to the sacrum.  They were packed off with the remainder of the  autologous bone graft along with some strips of Infuse being packed into  the lateral gutters.  The 90-mm pre-contoured rods were then used to  connect the pedicle screws from L3 to the sacrum and these were torqued  down in a neutral position.  Once this was accomplished and the wound  was irrigated copiously, care was taken to make sure that the nerve  roots were each well decompressed.  When this was the case on  radiographs and by direct palpation, the wound was ultimately closed  with 3-0 Vicryl in the  subcuticular tissues and dry, sterile dressing on  the skin.  The patient tolerated the procedure well.  He was returned to  recovery room in stable condition.  Blood loss of 900 mL was noted.  The  300 mL of Cell Saver blood was returned to the patient.      Earleen Newport, M.D.  Electronically Signed     HJE/MEDQ  D:  08/23/2008  T:  08/24/2008  Job:  [id number]

## 2010-08-21 NOTE — Assessment & Plan Note (Signed)
[name] OFFICE NOTE   [name]                       MRN:          [id number]  DATE:11/17/2006                            DOB:          Nov 19, 1928    PRIMARY CARE PHYSICIAN:  [name], MD   CARDIOLOGIST:  Shaune Pascal. Bensimhon, MD   GASTROINTESTINAL PROBLEM LIST:  1. Crohn's disease.  Distant ileal and right colon resection by Dr.      Gretta Cool in the very distant past.  He was maintained on      Pentasa, Anticort, and 72m of Purinethol for several years under      the care of Dr. SLyla Son  Hospitalization, May 2008, for acute      myocardial infarction complicated by small bowel obstruction likely      due to active Crohn's.  2. Increased Purinethol to 100 mg daily and liver tests increased as      well.  Currently holding 6-MP for past three weeks.   INTERVAL HISTORY:  I last saw Mr. BSkowronekabout a month and a half ago.  I  had increased his 6-MP to try to get a little more control out of his  diarrhea and liver tests increased as well.  His total bilirubin two  weeks ago was up to 3.3, AST 325, ALT 333.  It is suspected this was  from 6-MP toxicity, so I had him completely hold the 6-MP for which he  has done for the past three weeks and get back on Pentasa 6 pills a day.  He says with that change in medications, he is actually feeling much  better.  He is moving his bowels 1-3 times a day.  They seem to be  solidifying a bit, and he just overall feels much better.   CURRENT MEDICATIONS:  1. Lisinopril.  2. Prevacid.  3. Vitacon Forte.  4. FAlphia Moh  5. Carvedilol.  6. Plavix.  7. Aspirin.  8. Potassium.  9. Lipitor.  10.Pentasa two pills three times a day.   PHYSICAL EXAMINATION:  GENERAL:  Weight 150 pounds which is 4 pounds  less than he was a month ago.  VITAL SIGNS:  Blood pressure 122/56, pulse 80.  CONSTITUTIONAL:  Generally, well-appearing abdomen.  Soft and  nontender,  nondistended.  Normal bowel sounds.  EXTREMITIES:  No lower extremity edema.   ASSESSMENT/PLAN:  A 77year old man with long history of Crohn's  disease, recent small bowel obstruction.   He will get a complete metabolic profile and a CBC done today to check  on his liver tests.  I suspect he was having 6-MP toxicity.  Fortunately, coming off the 6-MP and changing him back to six pills of  Pentasa a day has actually improved his bowel symptoms.  He has no  bleeding.  He is moving his bowels less frequently, and he just overall  feels much better.  I cannot explain that as 6-MP is definitely a  stronger medicine in terms of Crohn's control.  But perhaps he just  responds better to mesalamine products.  We will  see what his liver  tests show.  If they are still quite abnormal, we will have to consider  other workup.  Of note, he has had a recent CT scan showing no mass in  his pancreas.  He has, however, had an ultrasound suggesting a slightly  dilated bile duct to 9 mm, but he is status post cholecystectomy.  If  his liver tests are improving, then he will simply stay on his current  regimen, will return to see me in two months, and he knows how to get in  touch if any thing new happens.     [name] Banister, MD  Electronically Signed    DPJ/MedQ  DD: 11/17/2006  DT: 11/18/2006  Job #: [id number]   cc:   Shaune Pascal. Bensimhon, MD  [name], MD

## 2010-08-21 NOTE — Assessment & Plan Note (Signed)
[facility]                            CARDIOLOGY OFFICE NOTE   LINDELL, RENFREW                       MRN:          [id number]  DATE:02/23/2008                            DOB:          [birth year]    PRIMARY CARE PHYSICIAN:  [name], MD   INTERVAL HISTORY:  [name] is a delightful 77 year old male with history of  coronary artery disease status post anterior ST elevation myocardial  infarction, earlier this year treated with angioplasty and bare metal  stenting of his LAD at [facility].  Ejection fraction was initially 35-  40%, but most recently 60%.  He also has a history of hypertension,  hyperlipidemia, Crohn disease, as well as gastroesophageal reflux  disease.   He returns today for routine followup.  He is doing great.  He is  exercising 3-4 times a week without any problems.  He is also playing  golf 18 holes.  He does note that after a round of golf, his legs are  just very fatigued.  He thinks this is due to his age.  He denies any  myalgias.   CURRENT MEDICATIONS:  1. Lisinopril 10 mg a day.  2. Prevacid 30 a day.  3. Vitacon Forte.  4. Plavix 75 a day.  5. Aspirin 325.  6. Lipitor 40 a day.  7. Pentasa.  8. Coreg 6.25 b.i.d.  9. Calcium.  10.Potassium.   PHYSICAL EXAMINATION:  GENERAL:  He is well-appearing in no acute  distress, ambulates in the clinic without any respiratory difficulty.  VITAL SIGNS:  Blood pressure is 122/80, heart rate is 89.  HEENT:  Normal.  NECK:  Supple.  No JVD.  Carotids are 2+ bilaterally without bruits.  There is no lymphadenopathy or thyromegaly.  CARDIAC:  PMI is nondisplaced.  Regular rate and rhythm.  No murmurs,  rubs, or gallops.  LUNGS:  Clear.  ABDOMEN:  Soft, nontender, nondistended.  No hepatosplenomegaly, no  bruits, no masses.  There is a well healed lower abdominal scar.  EXTREMITIES:  Warm with no cyanosis, clubbing, or edema.  No rash.  NEURO:  Alert and oriented x3.  Cranial  nerves II through XII are  intact.  Moves all 4 extremities without difficulty.  Affect is  pleasant.   EKG shows normal sinus rhythm at a rate of 89, no ST-T wave  abnormalities.  Total cholesterol is 124, triglycerides are 102, HDL is  49, LDL is 55.  Liver function panel is normal.   ASSESSMENT AND PLAN:  1. Coronary artery disease.  He is stable.  No evidence of ischemia.      Continue current therapy.  2. Hypertension, well controlled.  3. Hyperlipidemia, lipids look great.  These were followed by Dr.      Virgina Jock.  Goal LDL is at 70 which he is surpassed.   DISPOSITION:  [name] continues to quite well.  I will see him back in 9  months for routine followup.  I told him to call me sooner if there are  any problems.     Shaune Pascal. Bensimhon, MD  Electronically Signed    DRB/MedQ  DD: 02/23/2008  DT: 02/23/2008  Job #: [id number]   cc:   [name], MD  [name], MD

## 2010-08-21 NOTE — Discharge Summary (Signed)
[name], [name]:  [id number]   MEDICAL RECORD NO.:  [id number]          PATIENT TYPE:  INP   LOCATION:  9628                         FACILITY:  [facility]   PHYSICIAN:  Earleen Newport, M.D.  DATE OF BIRTH:  [birth year]   DATE OF ADMISSION:  08/23/2008  DATE OF DISCHARGE:  08/28/2008                               DISCHARGE SUMMARY   ADMITTING DIAGNOSIS:  Lumbar spondylosis and stenosis at L3-L4 and L5-S1  status post arthrodesis at L4-L5.   DISCHARGE AND FINAL DIAGNOSES:  1. Lumbar spondylosis and stenosis at L3-L4 and L5-S1 status post      arthrodesis at L4-L5.  2. Crohn disease.  3. Ileus status post lumbar decompressive surgery.  4. Acute blood loss anemia.   CONDITION ON DISCHARGE:  Improving.   HOSPITAL COURSE:  [name] is a 77 year old individual who has  previously had a lumbar decompression and fusion by me some 12 years  ago.  He had done well in the interim however over the past year's time  he has been having increasing back and leg pain.  He was found to have  severe spondylitic stenosis at L3-L4 in addition to lateral recess  stenosis at L5-S1 secondary to facet hypertrophy.  He was advised  regarding surgery which would include decompression arthrodesis at L3-L4  and L5-S1.  He was taken to the operating room on the date of admission  and underwent decompression stabilization at L3 to the sacrum.  Postoperatively, initially, seemed to be doing quite well.  However, he  became nauseated on the second postoperative day and was found to have a  paralytic ileus.  He was seen by Dr. Delfin Edis for evaluation of this  process and underwent placement of an NG tube and was maintained n.p.o.  He was started on some steroid medication and this rapidly improved his  condition.  He did have some difficulty with constipation.  This was  helped with decompression from below.  At the time of discharge, his  incision is clean and dry.  He is  requiring a minimum pain medication.  He has some Percocet at home.  No prescriptions were written.  It was  noted that during the postoperative period, he did have a hematocrit  that decreased to 30.  This was a drop of 6 grams from his preoperative  status of 36 and is suspected to be related to acute blood loss anemia  from his surgery.   CONDITION ON DISCHARGE:  Improving.      Earleen Newport, M.D.  Electronically Signed     HJE/MEDQ  D:  08/28/2008  T:  08/29/2008  Job:  [id number]

## 2010-08-21 NOTE — Assessment & Plan Note (Signed)
[facility] OFFICE NOTE   ALMOND, FITZGIBBON                       MRN:          [id number]  DATE:11/27/2006                            DOB:          09-19-1928    PRIMARY CARE PHYSICIAN:  [name], M.D.   GASTROENTEROLOGIST:  [name], M.D.   INTERVAL HISTORY:  Mr. Broadus is a delightful 77 year old male with a  history of coronary artery disease status post anterior ST elevation  myocardial infarction earlier this year, treated with angioplasty and  bare metal stenting of the LAD at [facility].  Ejection fraction was  initially 35-40%, but most recently 60%.  Also has a history of  hypertension, hyperlipidemia, Crohn's disease, and gastroesophageal  reflux disease.  He returns today for routine followup.   He has been going to cardiac rehab and doing quite well.  No chest pain  or shortness of breath.  However, he does note that his legs get  fatigued and had to stop playing golf after 12 holes recently.  He  denies any orthopnea or lower extremity edema.  He has not had any focal  weaknesses.  He did cut back his Coreg to 3.125 b.i.d. as he thought  this might be effecting it.  He also notes that he has lost about 15  pounds, but his wife has really changed his diet to be more healthy.  He  is back down to his previous baseline weight before his heart attack.   CURRENT MEDICATIONS:  1. Plavix 75 a day.  2. Lisinopril 10 a day.  3. Prevacid 30 a day.  4. Aspirin 325.  5. Potassium 40 a day.  6. Lipitor 40 a day.  7. Pentasa.  8. Coreg 3.125 b.i.d.   PHYSICAL EXAM:  He is well-appearing in no acute distress.  He ambulates  around the clinic without any respiratory difficulty.  Blood pressure is 104/62, heart rate is 84, weight 147.  HEENT:  Normal.  NECK:  Supple.  No JVD.  Carotids are 2+ bilaterally without any bruits.  There is no lymphadenopathy or thyromegaly.  CARDIAC:  PMI is  nondisplaced.  Regular rate and rhythm.  No murmurs,  rubs, or gallops.  LUNGS:  Clear.  ABDOMEN:  Soft, nontender, and nondistended.  There is no  hepatosplenomegaly.  No bruits.  No masses.  EXTREMITIES:  Warm with no cyanosis, clubbing, or edema.  His legs are  quite thin, but no focal atrophy.  No rash.  NEURO:  He is alert and oriented x3.  Cranial nerves 2-12 are intact.  Moves all 4 extremities without difficulty.  Affect is pleasant.  He  does have some very minimal weakness of his left hamstring, but this did  not seem to be pathologically weak.   ASSESSMENT AND PLAN:  1. Coronary artery disease status post recent myocardial infarction.      He is doing well.  Ejection fraction is normal.  He is on a good      medical regimen.  2. Hyperlipidemia.  We will check his cholesterol  levels.  3. Weight loss.  This is apparently mostly intentional, as his wife      has really changed his diet and he is back down to his baseline      weight.  However, if he continues to have weight loss, I think it      would be reasonable to pursue a little bit further workup to make      sure there is nothing more ominous in the background.  I will leave      this to the discretion of Dr. Virgina Jock.  I did not see symptoms      consistent with spinal stenosis.     Shaune Pascal. Bensimhon, MD  Electronically Signed    DRB/MedQ  DD: 11/27/2006  DT: 11/28/2006  Job #: [id number]   cc:   [name], MD  [name], MD

## 2010-08-21 NOTE — H&P (Signed)
[name], [name]:  [id number]   MEDICAL RECORD NO.:  [id number]          PATIENT TYPE:  INP   LOCATION:  4738                         FACILITY:  [name]   PHYSICIAN:  [name] Mungo. Lovena Le, MD    DATE OF BIRTH:  1928-11-26   DATE OF ADMISSION:  08/28/2006  DATE OF DISCHARGE:                              HISTORY & PHYSICAL   PRIMARY CARDIOLOGIST:  Dr. Coralie [name].   PRIMARY GI PHYSICIAN:  Dr. Lyla [name].   PRIMARY CARE PHYSICIAN:  Dr. Shon [name].   CHIEF COMPLAINT:  Abdominal pain, status post MI.   HISTORY OF PRESENT ILLNESS:  [name] is a 77 year old male with no  previous history of coronary artery disease.  He flew into [name] 2006 and developed chest pain after landing.  He was treated emergently with aspirin and sublingual nitroglycerin and  taken to [name], where a cardiac catheterization showed a 99% LAD,  which was treated with 2 bare-metal stents.  During his hospital stay,  he developed increased symptoms of Crohn's disease and there was concern  because of his recent MI that he would have cardiac complications as  well.  The patient requested transfer to [name] so he could be  evaluated by his regular GI physicians and his regular cardiologist.   [name] has not had chest pain in greater than 24 hours.  He has had  nausea, vomiting and diarrhea.  The diarrhea is chronic and not new, but  the nausea and vomiting as well as the abdominal discomfort are not  normal symptoms for him and he has these only when he is having a flare.  Currently, he is not nauseated and his abdomen is quiescent, but he had  nausea and vomiting earlier today.  He is not short of breath and not  having any other symptoms.   PAST MEDICAL HISTORY:  1. ST elevation MI, Aug 26, 2006, with left main, RCA and circumflex      with no significant disease, LAD 99% proximally, treated with      overlapping 15 x 3.0 and 8 x 2.75 [facility]  bare-metal stents.  2. Ischemic cardiomyopathy with an EF of 35% to 40% at cath.  3. History of Crohn disease.  4. Hypertension.  5. Hyperlipidemia.  6. Family history of coronary artery disease.  7. History of epistaxis, on Integrilin this past admission.  8. Gastroesophageal reflux disease.   SURGICAL HISTORY:  Status post cardiac catheterization as well as back  surgery, abdominal surgery secondary to Crohn's, cholecystectomy and  appendectomy.   ALLERGIES:  No known drug allergies.   CURRENT MEDICATIONS:  1. Aspirin 325 mg daily.  2. Plavix 75 mg a day.  3. Coreg 6.25 mg b.i.d.  4. Lipitor 80 mg a day.  5. Mercaptopurine 50 mg daily.  6. Hyoscyamine 0.375 mg daily.  7. Pentasa 250 mg t.i.d.  8. Budesonide 3 mg t.i.d.  9. Protonix 40 mg IV daily.  10.Lisinopril 20 mg daily, held secondary to hypotension..   SOCIAL HISTORY:  He lives in  [name] with his wife and is retired,  but continues to work part-time.  He exercises regularly and has no  history of alcohol, tobacco or drug abuse.   FAMILY HISTORY:  His mother died at age 68 of heart failure and his  father died at age 38 from emphysema.  He had 2 brothers that died with  coronary artery disease, 1 at 23 and 1 at 78.   REVIEW OF SYSTEMS:  He has nausea, vomiting and diarrhea, but he denies  bright red blood per rectum or melena.  He is hiccupping and has been  hiccupping for greater than 12 hours.  He denies hematuria or dysuria.  He has had no fevers today, but believes he ran a slight fever  yesterday.  Full 14-point review of systems is otherwise negative.   PHYSICAL EXAM:  VITAL SIGNS:  Temperature is 98.5, blood pressure  103/67, pulse 116, respiratory 20, O2 saturation 97% on room air.  GENERAL:  He is a well-developed, elderly white male in no acute  distress.  HEENT:  Normal.  NECK:  There is no thyromegaly, no carotid bruits, no elevated JVP, no  lymphadenopathy.  CV:  His heart is regular in rate and  rhythm with an S1 and S2 and no  significant murmur, rub or gallop is noted.  LUNGS:  Clear to auscultation bilaterally.  SKIN:  No rashes or lesions are noted.  ABDOMEN:  Soft and distended and bowel sounds are noted.  EXTREMITIES:  There is no cyanosis, clubbing or edema and his cath site  is well-healed.  MUSCULOSKELETAL:  There is no joint deformity or effusions noted.  NEUROLOGIC:  He is alert and oriented.  Cranial nerves II-XII are  grossly intact.   RADIOLOGIC FINDINGS:  Chest x-ray performed at [name] showed no  acute disease.   ACCESSORY CLINICAL DATA:  EKG at [name] showed normal sinus rhythm, rate 82  beats per minute and Q waves in V1 and V2.  His inferior T waves are  upright and there is no ST elevation as was present on an EKG dated Aug 26, 2006.   LABORATORY VALUES:  Hemoglobin 14.9, hematocrit 43, WBC 6.9, platelets  256,000.  Sodium 137, potassium 3.6, chloride 102, CO2 27, BUN 12,  creatinine 1.0, glucose 146, total cholesterol 172, triglycerides 210,  HDL 36, LDL 94.   IMPRESSION:  1. Status post ST-elevation myocardial infarction:  [name] has no      other significant lesions.  He is currently pain-free.  We will      continue his cardiac medications and have him seen by Cardiac      Rehabilitation.  2. Crohn's disease:  He will be continued on his home medications for      Crohn's disease.  A gastroenterology      consult will be obtained in the morning.  In the meantime, we will      obtain flat and upright abdominal films.  We will check a CMET to      get LFTs in the morning.   This is [name] Ferries, PA-C dictating for Dr. Cristopher [name] and Dr.  Brunetta [name].      [name] Ferries, PA-C      [name] Mungo. Lovena Le, MD  Electronically Signed    RB/MEDQ  D:  08/28/2006  T:  08/29/2006  Job:  [id number]   cc:   [name] Reamer, MD  [address]  [name]

## 2010-08-21 NOTE — Assessment & Plan Note (Signed)
[facility] OFFICE NOTE   [name] [name], [name] [name]                       MRN:          [id number]  DATE:10/07/2006                            DOB:          [birth year]    PRIMARY CARE PHYSICIAN:  [name] [name].   CARDIOLOGIST:  [name] [name].   GASTROINTESTINAL PROBLEM LIST:  1. Crohn's disease.  Distant ileal and right colon resection by Dr.      Gretta [name] in the very distant past.  He was maintained on      Pentasa, Anticort, and 32m of Purinethol for several years under      the care of [name] [name]  Hospitalization, May 2008, for acute      myocardial infarction complicated by small bowel obstruction likely      due to active Crohn's.   INTERVAL HISTORY:  I last saw [name] he was hospitalized at  [facility]  He had a high-grade small bowel obstruction that  complicated an acute MI.  The bowel obstruction was likely from  underlying Crohn's disease, and I thought this was fibrotic stricturing  disease, but he opened up fairly well, and perhaps it was from  underlying inflammatory Crohn's.  Since leaving the hospital, he has  done very well.  He is on more of a regular diet, although it is low  salt and low fat for cardiac reasons.  He has had no abdominal pains, no  nausea or vomiting.  Moving his bowels less frequently than he was prior  to admission but still not normally.  Specifically, he is having 1-2  loose bowel movements in the morning and then 1 bowel movement after  every meal.  This is about half of what he was doing prior to his  hospitalization.   CURRENT MEDICINES:  1. Entocort 9 mg daily.  2. Lisinopril.  3. Prevacid.  4. Vitacon Forte.  5. Captopril 100 mg daily.  6. Carbetolol.  7. Plavix.  8. Aspirin.  9. Potassium.  10.Lipitor.   PHYSICAL EXAMINATION:  VITAL SIGNS:  Weight 154 pounds, blood pressure  110/60, pulse 60.  CONSTITUTIONAL:  Generally  well-appearing.  NEUROLOGIC:  Alert and oriented x3.  ABDOMEN:  Soft, nontender, nondistended, normal bowel sounds.  EXTREMITIES:  No lower extremity edema.   ASSESSMENT/PLAN:  A 77year old man with long history of Crohn's, recent  small bowel obstruction.   I do not think his Crohn's is under very good control prior to his  recent hospitalization.  But since then, things to be heading more in  the right direction.  His mercaptopurine was doubled (up to 100 mg  daily), and he was tapered off of prednisone.  My goal will be to get  him on a therapeutic level of mercaptopurine so that that will hopefully  be his only Crohn's medicines.  I will aim for hopefully 2-3 bowel  movements a day which is more than normal range.  Currently, he is  having 5-6, but this is improving.  I elected to slowly taper him off of  the Anticort  so he will decrease by 1 pill a week over the next 2-3  weeks.  He will return to see me in 4 weeks' time.  He will also get a  CBC, complete metabolic profile today to check for immunomodulary  toxicities.  He will return to see me in 4 weeks and sooner if needed.     [name] Banister, MD  Electronically Signed    DPJ/MedQ  DD: 10/07/2006  DT: 10/07/2006  Job #: [id number]   cc:   [name] Reel, MD  [name] Pascal. Bensimhon, MD

## 2010-08-21 NOTE — Discharge Summary (Signed)
NAMEABDINASIR, SPADAFORE NO.:  [id number]   MEDICAL RECORD NO.:  [id number]          PATIENT TYPE:  INP   LOCATION:  8786                         FACILITY:  [facility]   PHYSICIAN:  Signa Kell, MD, FACCDATE OF BIRTH:  1928-10-15   DATE OF ADMISSION:  08/28/2006  DATE OF DISCHARGE:                               DISCHARGE SUMMARY   PROCEDURES:  1. Ultrasound of the abdomen  2. Ultrasound of the renal arteries.  3. CT of the abdomen and pelvis without contrast.  4. Two-view abdominal x-ray.  5. KUB.   Time at discharge 37 minutes.   HOSPITAL COURSE:  Mr. Sessums is a 77 year old male with no previous  history of coronary artery disease.  He was on a social trip and flew  into [place] airport.  He developed chest pain shortly after  landing.  He was treated efficiently by the staff at the airport and EMS  and was diagnosed with an ST-elevation MI.  He was taken urgently to [facility] and had two bare-metal stents placed to his LAD.  After the  procedure he had a Crohn's flare, and because of the combination of GI  problems for which he sees the doctors here in town and because of his  recent MI, he was transferred to [facility].   Mr. Quackenbush was continued on his cardiac medications including Coreg,  aspirin, Plavix, and Lipitor 80 which had been started at [place] consult was called to manage the Crohn's.  An initial KUB showed  gas suggesting small-bowel obstruction.  NG tube was placed and he was  made n.p.o.  An abdominal ultrasound as well as a renal ultrasound were  performed and it showed the common bile duct increased in caliber  measuring 9.8 mm in a patient who was status post cholecystectomy.  A  renal ultrasound was performed because of renal insufficiency with a  peak BUN and creatinine of 60/3.81.  The ultrasound showed no  hydronephrosis.  He was hydrated extensively and his renal insufficiency  resolved, with a BUN and  creatinine at discharge of 15/0.79.   A CT of the abdomen and pelvis without contrast was performed which  showed a partial small-bowel obstruction and there was a transition  point in the mid to distal jejunum.  Oral contrast did not extend into  the distal small bowel and colon which were normal in caliber.  Mr.  Pinedo was started on steroids and his other medications were adjusted.  His symptoms improved and he was then started on a steroid taper.  A  repeat x-ray showed a stable appearance of a partial small-bowel  obstruction.  The NG tube was removed and he was tolerating full  liquids.  Mr. Shurley was followed closely by both Dr. Virgina Jock, his internal  medicine physician, and GI.  Mr. Colquhoun was advanced to purees and his  bowel movements normalized.  He was seen by Dr. Virgina Jock on Sep 05, 2006,  and it was felt that he was stable from a GI standpoint and could follow  up  as an outpatient.  His cardiac status had remained stable but because  of his weakness and abdominal issues he had not been seen by cardiac  rehab.  This will be done as an outpatient.  As his renal function  normalized, the ACE inhibitor was reintroduced and his BUN and  creatinine did not change.  Of note, the acute renal failure was felt  secondary to contrast nephropathy and dehydration.  Fortunately, this  resolved with fluids.  Mr. Katich was evaluated by Dr. Velora Heckler and  considered stable for discharge on 09/05/2006 with outpatient followup  arranged.   DISCHARGE INSTRUCTIONS:  1. His activity level is to be increased gradually.  2. He is to call our office for any problems with the catheterization      site.  3. He is to stick to a low-sodium, low-fiber diet.  4. He is to follow up with Dr. Virgina Jock in 3 weeks.  5. He has an appointment with Dr. Haroldine Laws on June 19 at 10:45 a.m.  6. He is to follow up with Dr. Ardis Hughs on July 1 at 9:30 a.m.   DISCHARGE MEDICATIONS:  1. Mercaptopurine 100 mg daily.  2. Entocort  3 mg three tablets daily, restart after steroids were      completed on September 12, 2006.  3. Prednisone taper take as directed.  4. Prevacid 30 mg daily  5. Coreg 6.25 mg b.i.d.  6. Plavix 75 mg daily.  7. Aspirin 325 mg daily.  8. K-Dur 20 mEq daily.  9. Lipitor 40 mg daily.  10.Lisinopril 10 mg daily.  11.Sublingual nitroglycerin p.r.n.  12.Pentasa, atenolol, TriCor are discontinued.  13.Hyoscyamine 0.375 mg b.i.d. p.r.n.   DISCHARGE DIAGNOSES:  1. Crohn's disease with an acute flare.  2. ST elevation myocardial infarction on Aug 26, 2006, with the      catheterization showing left main nonobstructive plaques, right      coronary artery nonobstructive plaques, circumflex nonobstructive      plaques, left anterior descending artery with a 99% occlusion      proximally after the first septal perforator treated with a 15 x      3.0 Vision bare-metal stent and 8 x 2.75 Vision bare-metal stent at      the distal edge of the first stent due to edge dissection distally,      TIMI 3 flow post dilatation.  3. Ischemic cardiomyopathy with an ejection fraction of 35-40% at      catheterization.  4. Hypertension.  5. Hyperlipidemia.  6. Family history of coronary artery disease in his brother.  7. Nephrolithiasis.  8. Benign prostatic hypertrophy.  9. Osteoporosis.  10.Status post bowel resection of the ascending colon secondary to      ileus in 1978.  11.History of melanoma status post Mohs procedure.  12.History of shingles.  13.Status post cholecystectomy.  14.History of back operations.  15.History of gastrointestinal bleed secondary to Crohn's.  16.Status post appendectomy.      Rosaria Ferries, PA-C      Signa Kell, MD, [facility]  Electronically Signed    RB/MEDQ  D:  09/05/2006  T:  09/05/2006  Job:  [id number]   cc:   [name], MD  [name], MD  Royetta Crochet, M.D.

## 2010-08-21 NOTE — Assessment & Plan Note (Signed)
[facility] OFFICE NOTE   JAVIS, ABBOUD                       MRN:          [id number]  DATE:08/18/2007                            DOB:          September 23, 1928    PRIMARY CARE PHYSICIAN:  Dr. Shon Baton.   GASTROENTEROLOGIST:  Dr. Oretha Caprice.   HISTORY:  [name] [name] is a delightful 77 year old man with a history of  coronary artery disease status post anterior ST elevation myocardial  infarction earlier this year treated with angioplasty and bare metal  stenting of the LAD at [facility]. Ejection fraction initially was 35-40%  but most recently 60%.  He also has a history of hypertension,  hyperlipidemia, Crohn's  disease as well as gastroesophageal reflux  disease.   He returns today for routine follow-up.  He is doing great.  He denies  any chest pain or shortness of breath.  He has been exercising nearly  every day without any difficulty.  He did have a mild flare of his  Crohn's disease but this has been treated with prednisone.  He does take  his blood pressure every once in a while and notices his systolics are  typically in the 130-140 range.   CURRENT MEDICATIONS:  1. Lisinopril 10 mg a day.  2. Prevacid 30 a day.  3. Vitacon Forte once a day.  4. Plavix 75 a day.  5. Aspirin 325 a day.  6. Lipitor 40 a day.  7. Pentasa 2 tablets t.i.d.  8. Coreg 3.125 b.i.d.   PHYSICAL EXAM:  He is well-appearing in no acute distress. He ambulates  around the clinic without any respiratory difficulty.  Blood pressure is 130/80, heart rate 75, weights 158.  HEENT:  Normal.  NECK:  Supple.  No JVD.  Carotids are  2+ bilaterally without any  bruits.  There is no lymphadenopathy or thyromegaly.  CARDIAC:  PMI is nondisplaced.  He is regular with no murmurs, rubs or  gallops.  LUNGS:  Clear.  ABDOMEN:  Soft, nontender, nondistended, no hepatosplenomegaly, no  bruits, no masses.  There is a well-healed lower abdominal  scar.  EXTREMITIES:  Warm with no cyanosis, clubbing or edema.  No rash.  NEURO:  Alert and oriented x3.  Cranial nerves II-XII intact.  Moves all  four extremities without difficulty.  Affect is pleasant.   EKG shows normal sinus rhythm, rate of 75, no ST-T wave abnormalities.   ASSESSMENT/PLAN:  1. Coronary artery disease. This is very stable.  Continue current      therapy.  No evidence of ischemia.  2. Hypertension.  Blood pressure is mildly elevated. Increase Coreg to      6.25 b.i.d.  3. Hyperlipidemia. This is followed by Dr. Virgina Jock. Goal LDL is less      than 70.   DISPOSITION:  Gershon Mussel is doing great.  We will see him back in 6 months for  routine follow-up.  He knows to call sooner if there is a problem.     Shaune Pascal. Bensimhon, MD  Electronically Signed    DRB/MedQ  DD:  08/18/2007  DT: 08/18/2007  Job #: [id number]   cc:   [name], MD  [name], MD

## 2010-08-24 NOTE — Letter (Signed)
June 11, 2006    [name], [address]  [place], [place] [address]   RE:  [name], [name]  MRN:  [id number]  /  DOB:  1928-11-09   Dear Jenny Reichmann,   It was a pleasure to see your nice patient, [name] [name], for consultation  concerning hyperlipidemia.  He was seen on June 11, 2006.  [name] [name] is  a life-long friend of mine with history of hypertension, hyperlipidemia  and Crohn's disease.  He also has a family history of coronary disease  with brother having died at 87 of heart attack.  Other family history  reveals his father died at 44 of emphysema.  Mother died at 55 of  congestive heart failure.  One brother died at 75 following hemorrhoid  operation.  Another brother died at 2, of heart disease.   The patient, as you know, has had no cardiac symptoms.  He exercises  regularly, has never smoked.  He does not use alcohol.  He has had  multiple surgery, including back surgery, resection for Crohn's disease  in 1978, cholecystectomy in 2006, and appendectomy.   REVIEW OF SYSTEMS:  HEAD, EYES, EARS, NOSE AND THROAT:  Unremarkable.  He does wear glasses.  He had recent glaucoma testing.  CARDIORESPIRATORY:  Negative.  GI:  Unremarkable, except as noted above.  GU:  History unremarkable.  He has had recent urological exam.  ENDOCRINE:  He has had no symptoms.   ALLERGIES:  None.   OCCUPATION:  He works part-time, Hydrologist, fishes three times a week.  He  is married and has three children.  His grandson is graduating from  [employer] this year.   MEDICATIONS:  1. Pentasa 250 t.i.d.  2. Entocort 3 mg t.i.d.  3. Hyoscyamine.  4. Atenolol 25 daily.  5. Lisinopril 10.  6. Prevacid 30.  7. Purinethol 50 b.i.d.  8. Vitalcor.  9. Multivitamin.   PHYSICAL EXAM:  Blood pressure 140/80.  Pulse:  Normal sinus rhythm.  GENERAL APPEARANCE:  Normal.  JVP is not elevated.  Carotid pulses  equal, without bruits.  LUNGS:  Clear.  CARDIAC EXAM:  Normal with no murmur, gallop or rub.  ABDOMINAL EXAM:  Reveals palpable aorta, otherwise unremarkable.  EXTREMITIES:  Normal, good pulses bilaterally.  NEUROLOGIC:  Unremarkable.   EKG is normal.   IMPRESSION:  1. Hypertension, on therapy.  2. Hyperlipidemia with recent LDL of 166 and triglyceride of 198,      total of 252, HDL of 46.  3. Rule out abdominal aortic aneurysm.  4. Crohn's disease.   I have suggested a standard stress test, as well as abdominal  ultrasound, to rule out abdominal aortic aneurysm.  Also, I plan to  start him on Lipitor 40.  He should have followup lipid and LFTs in six  weeks.  I have not given him therapy for the triglycerides at this time.   I certainly hope he will benefit from the above therapy.    Sincerely,      E. Raymond Gurney, MD, [facility]  Electronically Signed    EJL/MedQ  DD: 06/11/2006  DT: 06/11/2006  Job #: [id number]

## 2010-08-24 NOTE — Assessment & Plan Note (Signed)
[place] OFFICE NOTE   [name], [name]                       MRN:          [id number]  DATE:06/11/2006                            DOB:          Sep 21, 1928    At the time he comes in, he says as far as his GI system is concerned he  is doing well.  He is presently taking Pentasa 250 mg t.i.d., Entocort 3  mg t.i.d., hyoscyamine b.i.d. as well as atenolol and lisinopril.  He  also takes a Prevacid 30 mg daily as well as 6NP 50 mg daily.  He takes  [employer].  He has had no GI bleeding, no pain in his  belly.  He says he has been feeling good.  Last colonoscopic examination  last year revealed only some minimal changes in the distal 10 cm of the  small bowel in the area of the anastomosis.   PHYSICAL EXAMINATION TODAY:  VITAL SIGNS:  He weighed 162, blood  pressure 128/78, pulse 76 and regular.  HEAD AND NECK:  Generally unremarkable.  ABDOMEN:  Soft, no masses, no organomegaly, nontender, no bruits or  rubs.  RECTAL:  Deferred.  EXTREMITIES:  Normal.   IMPRESSION:  1. Known Crohn's disease status post partial resection of terminal      ileum, in remission.  2. History of gastroesophageal reflux disease.  3. Hyperlipidemia.   RECOMMENDATIONS:  Continue the same medicine.  I want to get a B12 and  folate level on him as well because his MCV was noted to be elevated to  101 and he has had partial resection of his terminal ileum.  I just want  to make sure this is staying within normal levels.  I also noted that  his hyperlipidemia was significant.  He did indicate he was being  started on some statins by Dr. Virgina Jock.     Clarene Reamer, MD  Electronically Signed    SML/MedQ  DD: 06/11/2006  DT: 06/11/2006  Job #: [id number]   cc:   [name], MD

## 2010-09-04 ENCOUNTER — Other Ambulatory Visit: Payer: Self-pay | Admitting: Gastroenterology

## 2010-12-13 ENCOUNTER — Encounter: Payer: Self-pay | Admitting: Physician Assistant

## 2010-12-14 ENCOUNTER — Encounter: Payer: Self-pay | Admitting: *Deleted

## 2010-12-14 ENCOUNTER — Encounter: Payer: Self-pay | Admitting: Physician Assistant

## 2010-12-14 ENCOUNTER — Ambulatory Visit (INDEPENDENT_AMBULATORY_CARE_PROVIDER_SITE_OTHER): Payer: Medicare Other | Admitting: Physician Assistant

## 2010-12-14 VITALS — BP 142/84 | HR 70 | Ht 70.0 in | Wt 168.4 lb

## 2010-12-14 DIAGNOSIS — I2510 Atherosclerotic heart disease of native coronary artery without angina pectoris: Secondary | ICD-10-CM

## 2010-12-14 DIAGNOSIS — I209 Angina pectoris, unspecified: Secondary | ICD-10-CM

## 2010-12-14 DIAGNOSIS — R5381 Other malaise: Secondary | ICD-10-CM

## 2010-12-14 DIAGNOSIS — I10 Essential (primary) hypertension: Secondary | ICD-10-CM

## 2010-12-14 DIAGNOSIS — I208 Other forms of angina pectoris: Secondary | ICD-10-CM | POA: Insufficient documentation

## 2010-12-14 DIAGNOSIS — R5383 Other fatigue: Secondary | ICD-10-CM | POA: Insufficient documentation

## 2010-12-14 DIAGNOSIS — E785 Hyperlipidemia, unspecified: Secondary | ICD-10-CM

## 2010-12-14 LAB — BASIC METABOLIC PANEL
BUN: 17 mg/dL (ref 6–23)
CO2: 27 mEq/L (ref 19–32)
Calcium: 9.1 mg/dL (ref 8.4–10.5)
Chloride: 103 mEq/L (ref 96–112)
Creatinine, Ser: 1.1 mg/dL (ref 0.4–1.5)
GFR: 72.24 mL/min (ref >60.00)
Glucose, Bld: 89 mg/dL (ref 70–99)
Potassium: 4.2 mEq/L (ref 3.5–5.1)
Sodium: 139 mEq/L (ref 135–145)

## 2010-12-14 LAB — CBC WITH DIFFERENTIAL/PLATELET
Basophils Absolute: 0 10*3/uL (ref 0.0–0.1)
Basophils Relative: 0.5 % (ref 0.0–3.0)
Eosinophils Absolute: 0.1 10*3/uL (ref 0.0–0.7)
Eosinophils Relative: 1.5 % (ref 0.0–5.0)
HCT: 41.8 % (ref 39.0–52.0)
Hemoglobin: 13.8 g/dL (ref 13.0–17.0)
Lymphocytes Relative: 34.7 % (ref 12.0–46.0)
Lymphs Abs: 2.5 10*3/uL (ref 0.7–4.0)
MCHC: 33 g/dL (ref 30.0–36.0)
MCV: 96.2 fl (ref 78.0–100.0)
Monocytes Absolute: 0.6 10*3/uL (ref 0.1–1.0)
Monocytes Relative: 8.7 % (ref 3.0–12.0)
Neutro Abs: 4 10*3/uL (ref 1.4–7.7)
Neutrophils Relative %: 54.6 % (ref 43.0–77.0)
Platelets: 205 10*3/uL (ref 150.0–400.0)
RBC: 4.35 Mil/uL (ref 4.22–5.81)
RDW: 13.9 % (ref 11.5–14.6)
WBC: 7.3 10*3/uL (ref 4.5–10.5)

## 2010-12-14 LAB — TSH: TSH: 1.03 u[IU]/mL (ref 0.35–5.50)

## 2010-12-14 LAB — PROTIME-INR
INR: 1 ratio (ref 0.8–1.0)
Prothrombin Time: 11 s (ref 10.2–12.4)

## 2010-12-14 MED ORDER — ISOSORBIDE MONONITRATE ER 30 MG PO TB24: 30.0000 mg | ORAL_TABLET | Freq: Every day | ORAL | Status: DC

## 2010-12-14 NOTE — Assessment & Plan Note (Signed)
Continue ASA and Plavix.  Proceed to cath as noted.

## 2010-12-14 NOTE — Patient Instructions (Signed)
Your physician has requested that you have a cardiac catheterization 12/20/10 @ 7:30 AM WITH DR. Johnsie Cancel. Cardiac catheterization is used to diagnose and/or treat various heart conditions. Doctors may recommend this procedure for a number of different reasons. The most common reason is to evaluate chest pain. Chest pain can be a symptom of coronary artery disease (CAD), and cardiac catheterization can show whether plaque is narrowing or blocking your heart's arteries. This procedure is also used to evaluate the valves, as well as measure the blood flow and oxygen levels in different parts of your heart. For further information please visit [facility]. Please follow instruction sheet, as given.  Your physician recommends that you return for lab work in: [place] BMET, CBC W/DIFF, PT/INR AND ALSO A TSH 780.79  Your physician has recommended you make the following change in your medication: START IMDUR 30 MG 1 TABLET DAILY  IF YOUR SYMPTOMS GET WORSE BEFORE YOUR CATH YOU HAVE BEEN INSTRUCTED TO GO TO [place] EMERGENCY DEPT PER SCOTT WEAVER, PA-C

## 2010-12-14 NOTE — Assessment & Plan Note (Signed)
Somewhat elevated.  Start Imdur as noted.

## 2010-12-14 NOTE — Assessment & Plan Note (Signed)
Check TSH with precath labs (CBC, BMET, PT/PTT).

## 2010-12-14 NOTE — Assessment & Plan Note (Signed)
His shoulder symptoms are consistent with his previous angina.  I discussed proceeding to cardiac catheterization to further evaluate his symptoms.  I discussed this with Dr. Johnsie Cancel as well (DOD).  He agreed.  Risks and benefits of cardiac catheterization have been discussed with the patient.  These include bleeding, infection, kidney damage, stroke, heart attack, death.  The patient understands these risks and is willing to proceed.  He will be set up as an outpatient next week.  I will start him on Imdur 30 mg QD.  His NTG is up to date.  He knows to go to the ED if he feels worse.

## 2010-12-14 NOTE — Progress Notes (Signed)
History of Present Illness: Primary Cardiologist:  Dr. Glori Bickers   [name] [name] is a 77 y.o. male presents for shoulder pain and fatigue.  He has a history of CAD, status post anterior ST elevation myocardial infarction treated with bare-metal stent to the LAD at [facility] in [place] in 1/09.  His EF was 35-40%.  This improved to 60%.  Last echocardiogram performed 7/08.  Last Myoview 11/11: EF 71%, no scar or ischemia.  He also has a history of hypertension, hyperlipidemia, Crohn's disease, GERD.  Over the last several months, he has developed bilateral shoulder tightness with activity.  This resolves with rest.  He also notes increased dyspnea with exertion.  He describes class IIb symptoms.  He sleeps on 2 pillows.  He denies PND or edema.  He denies syncope.  Of note, exercise seems to make him feel better.  Shoulder symptoms seem to be getting worse.  He notes that these symptoms are consistent with his symptoms prior to his heart attack in 2009.  Past Medical History  Diagnosis Date  . Hypertension   . Hyperlipidemia   . Coronary artery disease     s/p ANTERIOR STEMI 1/09 at [facility]; treated with a bare-metal stent to the LAD; Myoview 11/11 EF 71%, no scar, no ischemia  . GERD (gastroesophageal reflux disease)   . Other esophagitis   . Diaphragmatic hernia without mention of obstruction or gangrene   . Ulcerative (chronic) ileocolitis   . Diverticulosis of colon (without mention of hemorrhage)   . Other chronic nonalcoholic liver disease   . Regional enteritis of small intestine   . Flatulence, eructation, and gas pain   . Ischemic cardiomyopathy     EF initially 35-40% after MI 1/09; echo 7/08: EF 60%    Current Outpatient Prescriptions  Medication Sig Dispense Refill  . Acetaminophen (TYLENOL PO) Take by mouth as needed.        Marland Kitchen aspirin 81 MG tablet Take 81 mg by mouth daily.        Marland Kitchen atorvastatin (LIPITOR) 40 MG tablet Take 40 mg by mouth daily.        Marland Kitchen CALCIUM PO  Take 600 mg by mouth daily.        . carvedilol (COREG) 6.25 MG tablet Take 6.25 mg by mouth 2 (two) times daily.        . cholestyramine (QUESTRAN) 4 GM/DOSE powder TAKE ONE SCOOPFUL DAILY  1 packet  6  . clopidogrel (PLAVIX) 75 MG tablet Take 75 mg by mouth daily.        . Lansoprazole (PREVACID PO) Take by mouth daily.        Marland Kitchen lisinopril (PRINIVIL,ZESTRIL) 10 MG tablet Take 10 mg by mouth daily.        . mesalamine (PENTASA) 250 MG CR capsule Take 250 mg by mouth. Taking 2 Tabs 3 Times Daily       . metoCLOPramide (REGLAN) 5 MG tablet Take 5 mg by mouth as needed.        . Multiple Vitamins-Minerals (CENTRUM SILVER PO) Take 50 mg by mouth daily.        . nitroGLYCERIN (NITROSTAT) 0.4 MG SL tablet Place 0.4 mg under the tongue every 5 (five) minutes as needed.        . Nutritional Supplements (NUTRITIONAL SUPPLEMENT PO) Take by mouth. Taking Vitamin D-3 Daily       . POTASSIUM CHLORIDE PO Take 99 mg by mouth daily.        Marland Kitchen  isosorbide mononitrate (IMDUR) 30 MG 24 hr tablet Take 1 tablet (30 mg total) by mouth daily.  30 tablet  11    Allergies: No Known Allergies  Social history:  Nonsmoker  Family history:  Positive for CAD  ROS:  Please see the history of present illness.  He feels tired.  He has some foot numbness related to an old back problem.  Otherwise, all other systems reviewed and negative.   Vital Signs: BP 142/84  Pulse 70  Ht 5' 10"  (1.778 m)  Wt 168 lb 6.4 oz (76.386 kg)  BMI 24.16 kg/m2  PHYSICAL EXAM: Well nourished, well developed, in no acute distress HEENT: normal Neck: no JVD Vascular: No carotid bruits bilaterally, no femoral artery bruits bilaterally Endocrine: No thyromegaly Cardiac:  normal S1, S2; RRR; no murmur Lungs:  clear to auscultation bilaterally, no wheezing, rhonchi or rales Abd: soft, nontender, no hepatomegaly Ext: no edema Skin: warm and dry Neuro:  CNs 2-12 intact, no focal abnormalities noted Psych: Normal affect  EKG:  Sinus  rhythm, heart rate 70, first degree AV block with a PR interval of 216 ms, normal axis, right bundle branch block, nonspecific ST-T wave changes  ASSESSMENT AND PLAN:

## 2010-12-14 NOTE — Assessment & Plan Note (Signed)
-  Continue Lipitor °

## 2010-12-20 ENCOUNTER — Inpatient Hospital Stay (HOSPITAL_BASED_OUTPATIENT_CLINIC_OR_DEPARTMENT_OTHER)
Admission: RE | Admit: 2010-12-20 | Discharge: 2010-12-20 | Disposition: A | Discharge status: Home / Self Care | Payer: Medicare Other | Source: Ambulatory Visit | Attending: Cardiovascular Disease | Admitting: Cardiovascular Disease

## 2010-12-20 DIAGNOSIS — Z9861 Coronary angioplasty status: Secondary | ICD-10-CM | POA: Insufficient documentation

## 2010-12-20 DIAGNOSIS — R5381 Other malaise: Secondary | ICD-10-CM | POA: Insufficient documentation

## 2010-12-20 DIAGNOSIS — I2510 Atherosclerotic heart disease of native coronary artery without angina pectoris: Secondary | ICD-10-CM

## 2010-12-21 NOTE — Cardiovascular Report (Signed)
  [name] [name], HEAL NO.:  [id number]  MEDICAL RECORD NO.:  [id number]  LOCATION:                                 FACILITY:  PHYSICIAN:  Wallis Bamberg. Johnsie Cancel, MD, FACCDATE OF BIRTH:  1928/11/05  DATE OF PROCEDURE:  12/20/2010 DATE OF DISCHARGE:                           CARDIAC CATHETERIZATION   Coronary arteriography.  INDICATION:  Previous stent to the LAD.  The patient having primarily severe fatigue.  Cine catheterization was done with 5-French catheters in the right femoral artery.  Standard JR-4 and JL-4 catheters were used  Left main coronary artery was normal.  Left anterior descending artery was widely patent.  The proximal LAD had a smooth 30-40% tubular lesion, it is somewhat difficult to know if the stent was oversized since the LAD is a small artery,  however, there is no flow-limiting focal stenosis.  The circumflex coronary artery was nondominant.  There is a large first obtuse marginal branch which was normal.  There was a large AV groove branch which was normal.  The right coronary artery was super dominant.  The proximal mid and distal vessel were normal.  PDA and posterior lateral branch were normal.  RAO ventriculography:  RAO ventriculography reveals normal LV function. EF was 60%.  There was no gradient across the aortic valve and no MR.  Aortic pressure was 106/53, LV pressure was 103/10.  There is no grading across the aortic valve.  IMPRESSION:  The patient has some smooth tubular disease in the proximal left anterior descending.  It may be that the stent is oversized.  There is no flow-limiting lesions.  The stent is widely patent.  He has normal LV function and a low LVEDP.  His primary symptoms are profound fatigue. This would appear to be noncardiac in etiology.  He already has a followup appointment with Dr. Virgina Jock.  He tolerated this diagnostic cath well.     Wallis Bamberg. Johnsie Cancel, MD, [facility]     PCN/MEDQ  D:   12/20/2010  T:  12/20/2010  Job:  [id number]  cc:   Shaune Pascal. Bensimhon, MD [name], MD  Electronically Signed by Jenkins Rouge MD [facility] on 12/21/2010 10:48:13 AM

## 2010-12-25 ENCOUNTER — Encounter: Payer: Self-pay | Admitting: *Deleted

## 2011-01-04 ENCOUNTER — Encounter: Payer: Self-pay | Admitting: Physician Assistant

## 2011-01-04 ENCOUNTER — Ambulatory Visit (INDEPENDENT_AMBULATORY_CARE_PROVIDER_SITE_OTHER): Payer: Medicare Other | Admitting: Physician Assistant

## 2011-01-04 VITALS — BP 139/78 | HR 86 | Ht 70.0 in | Wt 169.0 lb

## 2011-01-04 DIAGNOSIS — R5383 Other fatigue: Secondary | ICD-10-CM

## 2011-01-04 DIAGNOSIS — E785 Hyperlipidemia, unspecified: Secondary | ICD-10-CM

## 2011-01-04 DIAGNOSIS — I10 Essential (primary) hypertension: Secondary | ICD-10-CM

## 2011-01-04 DIAGNOSIS — I2510 Atherosclerotic heart disease of native coronary artery without angina pectoris: Secondary | ICD-10-CM

## 2011-01-04 DIAGNOSIS — B354 Tinea corporis: Secondary | ICD-10-CM

## 2011-01-04 DIAGNOSIS — R5381 Other malaise: Secondary | ICD-10-CM

## 2011-01-04 NOTE — Assessment & Plan Note (Signed)
Stable CAD.  Continue aspirin and Plavix.  He seems to note an improvement with isosorbide.  He can continue that as his blood pressure does look better.  Follow up with Dr. Haroldine Laws in 3-4 months.

## 2011-01-04 NOTE — Assessment & Plan Note (Signed)
Improved

## 2011-01-04 NOTE — Patient Instructions (Addendum)
Get Lamisil cream and apply to the red area above your right leg twice daily for 1-2 weeks (until gone).  If no better, let your dermatologist or PCP look at it.  Your physician wants you to follow-up in: [name]. [name] [name].  You will receive a reminder letter in the mail two months in advance. If you don't receive a letter, please call our office to schedule the follow-up appointment.

## 2011-01-04 NOTE — Progress Notes (Signed)
History of Present Illness: Primary Cardiologist:  Dr. Glori Bickers   [name] [name] is a 77 y.o. male presents for shoulder pain and fatigue.  He has a history of CAD, status post anterior ST elevation myocardial infarction treated with bare-metal stent to the LAD at [facility] in [place] in 1/09.  His EF was 35-40%.  This improved to 60%.  Last echocardiogram performed 7/08.  Last Myoview 11/11: EF 71%, no scar or ischemia.  He also has a history of hypertension, hyperlipidemia, Crohn's disease, GERD.  I saw him on 9/7 for bilateral shoulder tightness with exertion similar to previous angina.  I set him up for cardiac cath.  This was done by Dr. Johnsie Cancel on 9/13: pLAD 30-40%, stent ok, ? If stent oversized, EF 60% and low LVEDP.  Medical therapy continued.  He returns for follow up.  Of note, he has seen his PCP for fatigue.  He has a low testosterone level and workup is being undertaken currently.  Notes his bilateral shoulder pain improved with Imdur.  Will continue that.  No chest pain. No dyspnea.  No syncope.  No edema.    Past Medical History  Diagnosis Date  . Hypertension   . Hyperlipidemia   . Coronary artery disease     s/p ANTERIOR STEMI 1/09 at [facility]; treated with a bare-metal stent to the LAD; Myoview 11/11 EF 71%, no scar, no ischemia;     cath 12/20/10:   pLAD 30-40%, stent ok, ? If stent oversized, EF 60% and low LVEDP  . GERD (gastroesophageal reflux disease)   . Other esophagitis   . Diaphragmatic hernia without mention of obstruction or gangrene   . Ulcerative (chronic) ileocolitis   . Diverticulosis of colon (without mention of hemorrhage)   . Other chronic nonalcoholic liver disease   . Regional enteritis of small intestine   . Flatulence, eructation, and gas pain   . Ischemic cardiomyopathy     EF initially 35-40% after MI 1/09; echo 7/08: EF 60%    Current Outpatient Prescriptions  Medication Sig Dispense Refill  . Acetaminophen (TYLENOL PO) Take by mouth as  needed.        Marland Kitchen aspirin 81 MG tablet Take 81 mg by mouth daily.        Marland Kitchen atorvastatin (LIPITOR) 40 MG tablet Take 40 mg by mouth daily.        Marland Kitchen CALCIUM PO Take 600 mg by mouth daily.        . carvedilol (COREG) 3.125 MG tablet 1 tab bid      . cetirizine (ZYRTEC) 10 MG tablet Take 10 mg by mouth as needed.        . cholestyramine (QUESTRAN) 4 GM/DOSE powder TAKE ONE SCOOPFUL DAILY  1 packet  6  . clopidogrel (PLAVIX) 75 MG tablet Take 75 mg by mouth daily.        . isosorbide mononitrate (IMDUR) 30 MG 24 hr tablet Take 1 tablet (30 mg total) by mouth daily.  30 tablet  11  . Lansoprazole (PREVACID PO) Take by mouth daily.        Marland Kitchen lisinopril (PRINIVIL,ZESTRIL) 10 MG tablet Take 10 mg by mouth daily.        . mesalamine (PENTASA) 250 MG CR capsule Take 250 mg by mouth. Taking 2 Tabs 3 Times Daily       . nitroGLYCERIN (NITROSTAT) 0.4 MG SL tablet Place 0.4 mg under the tongue every 5 (five) minutes as needed.        Marland Kitchen  NON FORMULARY 2,500 mg daily.        . Nutritional Supplements (NUTRITIONAL SUPPLEMENT PO) Take by mouth. Taking Vitamin D-3 Daily       . POTASSIUM CHLORIDE PO Take 595 mg by mouth daily.       Marland Kitchen pyridOXINE (VITAMIN B-6) 100 MG tablet Take 100 mg by mouth daily.          Allergies: No Known Allergies  Social history:  Nonsmoker  Vital Signs: BP 139/78  Pulse 86  Ht 5' 10"  (1.778 m)  Wt 169 lb (76.658 kg)  BMI 24.25 kg/m2  PHYSICAL EXAM: Well nourished, well developed, in no acute distress HEENT: normal Neck: no JVD Cardiac:  normal S1, S2; RRR; no murmur Lungs:  clear to auscultation bilaterally, no wheezing, rhonchi or rales Abd: soft, nontender, no hepatomegaly Ext: no edema; RFA site without hematoma or bruit Skin: warm and dry, red annular plaque noted in right groin Neuro:  CNs 2-12 intact, no focal abnormalities noted Psych: Normal affect  EKG:  Sinus rhythm, heart rate 85, normal axis, right bundle branch block, nonspecific ST-T wave  changes  ASSESSMENT AND PLAN:

## 2011-01-04 NOTE — Assessment & Plan Note (Signed)
Managed by PCP

## 2011-01-04 NOTE — Assessment & Plan Note (Signed)
As noted, testosterone levels have been low and workup is pending with PCP.

## 2011-01-04 NOTE — Assessment & Plan Note (Signed)
He can use over-the-counter Lamisil Cream twice daily until clear.

## 2011-02-12 ENCOUNTER — Ambulatory Visit: Payer: Medicare Other | Admitting: Internal Medicine

## 2011-02-15 ENCOUNTER — Encounter: Payer: Self-pay | Admitting: Physician Assistant

## 2011-04-20 ENCOUNTER — Other Ambulatory Visit: Payer: Self-pay | Admitting: Gastroenterology

## 2011-05-13 ENCOUNTER — Encounter: Payer: Self-pay | Admitting: Gastroenterology

## 2011-05-24 ENCOUNTER — Encounter: Payer: Self-pay | Admitting: Gastroenterology

## 2011-05-24 ENCOUNTER — Ambulatory Visit (INDEPENDENT_AMBULATORY_CARE_PROVIDER_SITE_OTHER): Payer: Medicare Other | Admitting: Gastroenterology

## 2011-05-24 DIAGNOSIS — R1310 Dysphagia, unspecified: Secondary | ICD-10-CM | POA: Insufficient documentation

## 2011-05-24 NOTE — Progress Notes (Signed)
GI PROBLEM LIST:  1. Crohn's disease. Distant ileal and right colon resection by Dr. Gretta Cool in the very distant past. He was maintained on Pentasa, Entocort, and 85m of Purinethol for several years under the care of Dr. SLyla Son Hospitalization, May 2008, for acute myocardial infarction complicated by small bowel obstruction likely due to active Crohn's. Increased Purinethol to 100 mg daily and liver tests increased as well. Purinethol held and liver test normalized. The patient felt much better overall as well (less diarrhea, less fatigue). Winter, 2009: currently on 6 pills of Pentasa day feeling quite well overall. Summer, 2010 postoperative ileus following spine surgery (doubt active Crohn's flare). November, 2011: started cholestyramine with very good results (loose stools MUCH improved, only going 3-4 times a day)   HPI: This is a   very pleasant 78year old man whom I last saw about a year ago for Crohn's followup.  Had an episode of inability to swallow.  Felt like a bolus of food hung in lower esophagus.  Was eating out.  Drank milk later and it went down at that point.  Around the same time he had a lot of indigestion. His wife had knee surgery recently and he has been doing all of the cooking, eating out a lot more.  Called his PCP, a medicine was given to him, he took it twice a day for two days and   He has already been on prevacid qam.    Overall stable weight.     Past Medical History  Diagnosis Date  . Hypertension   . Hyperlipidemia   . Coronary artery disease     [facility] with a bare-metal stent to the LAD; Myoview 11/11 EF 71%, no scar, no ischemia;     cath 12/20/10:   pLAD 30-40%, stent ok, ? If stent oversized, EF 60% and low LVEDP  . GERD (gastroesophageal reflux disease)   . Other esophagitis   . Diaphragmatic hernia without mention of obstruction or gangrene   . Ulcerative (chronic) ileocolitis   . Diverticulosis of colon  (without mention of hemorrhage)   . Other chronic nonalcoholic liver disease   . Regional enteritis of small intestine   . Flatulence, eructation, and gas pain   . Ischemic cardiomyopathy     EF initially 35-40% after MI 1/09; echo 7/08: EF 60%    Past Surgical History  Procedure Date  . Cardiac catheterization 09/25/06    EF 35-40% but more recently 60%  . Cholecystectomy   . Appendectomy   . Back surgery     L3, L4, L5    Current Outpatient Prescriptions  Medication Sig Dispense Refill  . Acetaminophen (TYLENOL PO) Take by mouth as needed.        .Marland Kitchenaspirin 81 MG tablet Take 81 mg by mouth daily.        .Marland Kitchenatorvastatin (LIPITOR) 40 MG tablet Take 40 mg by mouth daily.        .Marland KitchenCALCIUM PO Take 600 mg by mouth daily.        . carvedilol (COREG) 3.125 MG tablet 1 tab bid      . cetirizine (ZYRTEC) 10 MG tablet Take 10 mg by mouth as needed.        . cholestyramine (QUESTRAN) 4 GM/DOSE powder TAKE ONE SCOOPFUL DAILY  1 packet  6  . clopidogrel (PLAVIX) 75 MG tablet Take 75 mg by mouth daily.        .Marland Kitchen  cyanocobalamin 1000 MCG tablet Take 100 mcg by mouth daily.      . isosorbide mononitrate (IMDUR) 30 MG 24 hr tablet Take 1 tablet (30 mg total) by mouth daily.  30 tablet  11  . Lansoprazole (PREVACID PO) Take by mouth daily.        Marland Kitchen lisinopril (PRINIVIL,ZESTRIL) 10 MG tablet Take 10 mg by mouth daily.        . metoCLOPramide (REGLAN) 5 MG tablet Take 5 mg by mouth 3 (three) times daily as needed.      . nitroGLYCERIN (NITROSTAT) 0.4 MG SL tablet Place 0.4 mg under the tongue every 5 (five) minutes as needed.        . Nutritional Supplements (NUTRITIONAL SUPPLEMENT PO) Take by mouth. Taking Vitamin D-3 Daily       . PENTASA 250 MG CR capsule TAKE TWO CAPSULES THREE TIMES DAILY  180 capsule  1  . POTASSIUM CHLORIDE PO Take 595 mg by mouth daily.       Marland Kitchen pyridOXINE (VITAMIN B-6) 100 MG tablet Take 100 mg by mouth daily.        . Testosterone (ANDROGEL PUMP TD) Place onto the skin. 2  pumps once daily        Allergies as of 05/24/2011  . (No Known Allergies)    Family History  Problem Relation Age of Onset  . Coronary artery disease    . Heart failure Mother   . Emphysema Father   . Heart disease Brother   . Heart disease Brother   . Colon cancer Brother     History   Social History  . Marital Status: Married    Spouse Name: N/A    Number of Children: 3  . Years of Education: N/A   Occupational History  . CEO-retired    Social History Main Topics  . Smoking status: Never Smoker   . Smokeless tobacco: Never Used  . Alcohol Use: Yes     occ.  . Drug Use: No  . Sexually Active: Not on file   Other Topics Concern  . Not on file   Social History Narrative  . No narrative on file      Physical Exam: BP 130/70  Pulse 82  Ht 5' 10"  (1.778 m)  Wt 172 lb (78.019 kg)  BMI 24.68 kg/m2 Constitutional: generally well-appearing Psychiatric: alert and oriented x3 Abdomen: soft, nontender, nondistended, no obvious ascites, no peritoneal signs, normal bowel sounds     Assessment and plan: 78 y.o. male with recent dysphasia, worsening indigestion  First his symptoms seem improved on proton pump inhibitor and I believe Reglan was prescribed by his primary care physician. I recommended he continue those for now. He is on Plavix and that puts him at increased risk for endoscopic complications especially if dilation needs to be performed. He'll get a barium esophagram first and if stricture noted then we will likely have him hold his Plavix if that is okay with his cardiologist. He also knows to eat slowly, take small bites and chew his food very well in the meantime.

## 2011-05-24 NOTE — Patient Instructions (Addendum)
Barium esophagram for recent dysphagia, check for stricture.  05/27/11 930 am arrive at 915 am at [facility].  There are no prep instructions for this procedure. You may need EGD with dilation depending on those results.  Would want to hold your plavix for 5 days (if ok with cardiology). For now, stay on prevacid daily. Chew your food well, eat slowly, take small bites.

## 2011-05-27 ENCOUNTER — Ambulatory Visit (HOSPITAL_COMMUNITY)
Admission: RE | Admit: 2011-05-27 | Discharge: 2011-05-27 | Disposition: A | Discharge status: Home / Self Care | Payer: Medicare Other | Source: Ambulatory Visit | Attending: Gastroenterology | Admitting: Gastroenterology

## 2011-05-27 DIAGNOSIS — R1310 Dysphagia, unspecified: Secondary | ICD-10-CM | POA: Insufficient documentation

## 2011-05-30 ENCOUNTER — Telehealth: Payer: Self-pay

## 2011-05-30 NOTE — Telephone Encounter (Signed)
Pt will be made aware at his previsit appointment

## 2011-05-30 NOTE — Telephone Encounter (Signed)
Message copied by Barron Alvine on Thu May 30, 2011  1:27 PM ------      Message from: Owens Loffler P      Created: Thu May 30, 2011  8:17 AM                   Please call the patient. The esophagram shows slight narrowing at GE junction. He needs EGD with dilation at [facility], we will need to contact his cardiologist about holding the plavix for 5 days prior to decrease chances of significant dilation related bleeding.

## 2011-05-30 NOTE — Telephone Encounter (Signed)
05/30/2011    RE: LULA MICHAUX DOB: [birth year] MRN: [id number]   Dear Dr Lilla Shook,    We have scheduled the above patient for an endoscopic procedure with Dr Ardis Hughs. Our records show that he is on anticoagulation therapy.   Please advise as to how long the patient may come off his therapy of Plavix prior to the procedure.  Please fax back/ or route the completed form to [place] ([place]) at [phone].   Sincerely,  Christian Mate CMA ([place])   Please respond by 06/06/11 and route back to [facility])

## 2011-05-30 NOTE — Telephone Encounter (Signed)
He has a bare metal stent placed in his LAD in 2009 so should be OK to stop Plavix. Would let him know that there is an extremely tiny chance he could have problems with his stent when coming off Plavix but I think it is fine to stop for 5-7 days.

## 2011-06-02 ENCOUNTER — Encounter (HOSPITAL_COMMUNITY): Payer: Self-pay | Admitting: *Deleted

## 2011-06-02 ENCOUNTER — Inpatient Hospital Stay (HOSPITAL_COMMUNITY)
Admission: EM | Admit: 2011-06-02 | Discharge: 2011-06-06 | DRG: 386 | Disposition: A | Discharge status: Home / Self Care | Payer: Medicare Other | Attending: Internal Medicine | Admitting: Internal Medicine

## 2011-06-02 ENCOUNTER — Emergency Department (HOSPITAL_COMMUNITY): Payer: Medicare Other

## 2011-06-02 DIAGNOSIS — K5090 Crohn's disease, unspecified, without complications: Secondary | ICD-10-CM | POA: Diagnosis present

## 2011-06-02 DIAGNOSIS — Z79899 Other long term (current) drug therapy: Secondary | ICD-10-CM

## 2011-06-02 DIAGNOSIS — K5669 Other intestinal obstruction: Secondary | ICD-10-CM | POA: Diagnosis present

## 2011-06-02 DIAGNOSIS — R1310 Dysphagia, unspecified: Secondary | ICD-10-CM | POA: Diagnosis present

## 2011-06-02 DIAGNOSIS — K219 Gastro-esophageal reflux disease without esophagitis: Secondary | ICD-10-CM | POA: Diagnosis present

## 2011-06-02 DIAGNOSIS — Z7982 Long term (current) use of aspirin: Secondary | ICD-10-CM

## 2011-06-02 DIAGNOSIS — M810 Age-related osteoporosis without current pathological fracture: Secondary | ICD-10-CM | POA: Diagnosis present

## 2011-06-02 DIAGNOSIS — Z7902 Long term (current) use of antithrombotics/antiplatelets: Secondary | ICD-10-CM

## 2011-06-02 DIAGNOSIS — Z9861 Coronary angioplasty status: Secondary | ICD-10-CM

## 2011-06-02 DIAGNOSIS — I2510 Atherosclerotic heart disease of native coronary artery without angina pectoris: Secondary | ICD-10-CM | POA: Diagnosis present

## 2011-06-02 DIAGNOSIS — E860 Dehydration: Secondary | ICD-10-CM | POA: Diagnosis present

## 2011-06-02 DIAGNOSIS — K56600 Partial intestinal obstruction, unspecified as to cause: Secondary | ICD-10-CM

## 2011-06-02 DIAGNOSIS — I10 Essential (primary) hypertension: Secondary | ICD-10-CM | POA: Insufficient documentation

## 2011-06-02 DIAGNOSIS — Z981 Arthrodesis status: Secondary | ICD-10-CM

## 2011-06-02 DIAGNOSIS — I2589 Other forms of chronic ischemic heart disease: Secondary | ICD-10-CM | POA: Diagnosis present

## 2011-06-02 DIAGNOSIS — K56609 Unspecified intestinal obstruction, unspecified as to partial versus complete obstruction: Secondary | ICD-10-CM | POA: Diagnosis present

## 2011-06-02 DIAGNOSIS — K565 Intestinal adhesions [bands], unspecified as to partial versus complete obstruction: Secondary | ICD-10-CM | POA: Diagnosis present

## 2011-06-02 DIAGNOSIS — I252 Old myocardial infarction: Secondary | ICD-10-CM

## 2011-06-02 DIAGNOSIS — K449 Diaphragmatic hernia without obstruction or gangrene: Secondary | ICD-10-CM | POA: Diagnosis present

## 2011-06-02 DIAGNOSIS — K5000 Crohn's disease of small intestine without complications: Principal | ICD-10-CM

## 2011-06-02 DIAGNOSIS — E785 Hyperlipidemia, unspecified: Secondary | ICD-10-CM | POA: Diagnosis present

## 2011-06-02 HISTORY — DX: Fatty (change of) liver, not elsewhere classified: K76.0

## 2011-06-02 LAB — CBC
HCT: 47.4 % (ref 39.0–52.0)
Hemoglobin: 16.7 g/dL (ref 13.0–17.0)
MCH: 32 pg (ref 26.0–34.0)
MCHC: 35.2 g/dL (ref 30.0–36.0)
MCV: 90.8 fL (ref 78.0–100.0)
Platelets: 198 10*3/uL (ref 150–400)
RBC: 5.22 MIL/uL (ref 4.22–5.81)
RDW: 13.8 % (ref 11.5–15.5)
WBC: 8.8 10*3/uL (ref 4.0–10.5)

## 2011-06-02 LAB — DIFFERENTIAL
Basophils Absolute: 0 10*3/uL (ref 0.0–0.1)
Basophils Relative: 0 % (ref 0–1)
Eosinophils Absolute: 0 10*3/uL (ref 0.0–0.7)
Eosinophils Relative: 0 % (ref 0–5)
Lymphocytes Relative: 18 % (ref 12–46)
Lymphs Abs: 1.6 10*3/uL (ref 0.7–4.0)
Monocytes Absolute: 0.7 10*3/uL (ref 0.1–1.0)
Monocytes Relative: 8 % (ref 3–12)
Neutro Abs: 6.5 10*3/uL (ref 1.7–7.7)
Neutrophils Relative %: 74 % (ref 43–77)

## 2011-06-02 LAB — COMPREHENSIVE METABOLIC PANEL
ALT: 31 U/L (ref 0–53)
AST: 23 U/L (ref 0–37)
Albumin: 3.8 g/dL (ref 3.5–5.2)
Alkaline Phosphatase: 68 U/L (ref 39–117)
BUN: 18 mg/dL (ref 6–23)
CO2: 24 mEq/L (ref 19–32)
Calcium: 9.8 mg/dL (ref 8.4–10.5)
Chloride: 102 mEq/L (ref 96–112)
Creatinine, Ser: 0.99 mg/dL (ref 0.50–1.35)
GFR calc Af Amer: 87 mL/min — ABNORMAL LOW (ref >90)
GFR calc non Af Amer: 75 mL/min — ABNORMAL LOW (ref >90)
Glucose, Bld: 150 mg/dL — ABNORMAL HIGH (ref 70–99)
Potassium: 4 mEq/L (ref 3.5–5.1)
Sodium: 137 mEq/L (ref 135–145)
Total Bilirubin: 2.7 mg/dL — ABNORMAL HIGH (ref 0.3–1.2)
Total Protein: 7.6 g/dL (ref 6.0–8.3)

## 2011-06-02 LAB — URINALYSIS, ROUTINE W REFLEX MICROSCOPIC
Bilirubin Urine: NEGATIVE
Glucose, UA: NEGATIVE mg/dL
Hgb urine dipstick: NEGATIVE
Ketones, ur: 15 mg/dL — AB
Leukocytes, UA: NEGATIVE
Nitrite: NEGATIVE
Protein, ur: NEGATIVE mg/dL
Specific Gravity, Urine: 1.026 (ref 1.005–1.030)
Urobilinogen, UA: 0.2 mg/dL (ref 0.0–1.0)
pH: 5.5 (ref 5.0–8.0)

## 2011-06-02 LAB — LIPASE, BLOOD: Lipase: 23 U/L (ref 11–59)

## 2011-06-02 MED ORDER — IOHEXOL 300 MG/ML  SOLN
20.0000 mL | INTRAMUSCULAR | Status: DC
  Administered 2011-06-02: 20 mL via ORAL

## 2011-06-02 MED ORDER — ONDANSETRON HCL 4 MG/2ML IJ SOLN
4.0000 mg | Freq: Once | INTRAMUSCULAR | Status: AC
  Administered 2011-06-02: 4 mg via INTRAVENOUS
  Filled 2011-06-02: qty 2

## 2011-06-02 MED ORDER — HYDROMORPHONE HCL PF 1 MG/ML IJ SOLN
1.0000 mg | INTRAMUSCULAR | Status: DC | PRN
  Administered 2011-06-02 – 2011-06-05 (×3): 1 mg via INTRAVENOUS
  Filled 2011-06-02 (×3): qty 1

## 2011-06-02 MED ORDER — CHLORHEXIDINE GLUCONATE 0.12 % MT SOLN
15.0000 mL | Freq: Two times a day (BID) | OROMUCOSAL | Status: DC
  Administered 2011-06-02 – 2011-06-05 (×7): 15 mL via OROMUCOSAL
  Filled 2011-06-02 (×7): qty 15

## 2011-06-02 MED ORDER — PHENOL 1.4 % MT LIQD
1.0000 | OROMUCOSAL | Status: DC | PRN
  Administered 2011-06-03: 1 via OROMUCOSAL
  Filled 2011-06-02: qty 177

## 2011-06-02 MED ORDER — PANTOPRAZOLE SODIUM 40 MG IV SOLR
40.0000 mg | INTRAVENOUS | Status: DC
  Administered 2011-06-02 – 2011-06-03 (×2): 40 mg via INTRAVENOUS
  Filled 2011-06-02 (×3): qty 40

## 2011-06-02 MED ORDER — BIOTENE DRY MOUTH MT LIQD
15.0000 mL | Freq: Two times a day (BID) | OROMUCOSAL | Status: DC
  Administered 2011-06-02 – 2011-06-05 (×7): 15 mL via OROMUCOSAL

## 2011-06-02 MED ORDER — HYDROMORPHONE HCL PF 1 MG/ML IJ SOLN
1.0000 mg | INTRAMUSCULAR | Status: AC
  Administered 2011-06-02: 1 mg via INTRAVENOUS

## 2011-06-02 MED ORDER — SODIUM CHLORIDE 0.9 % IV SOLN
INTRAVENOUS | Status: DC
  Administered 2011-06-02 – 2011-06-05 (×6): via INTRAVENOUS

## 2011-06-02 MED ORDER — HYDROMORPHONE HCL PF 1 MG/ML IJ SOLN
1.0000 mg | Freq: Once | INTRAMUSCULAR | Status: AC
  Administered 2011-06-02: 1 mg via INTRAVENOUS
  Filled 2011-06-02: qty 1

## 2011-06-02 MED ORDER — ENALAPRILAT 1.25 MG/ML IV SOLN
1.2500 mg | Freq: Four times a day (QID) | INTRAVENOUS | Status: DC
  Administered 2011-06-03 – 2011-06-05 (×10): 1.25 mg via INTRAVENOUS
  Filled 2011-06-02 (×14): qty 1

## 2011-06-02 MED ORDER — HYDROMORPHONE HCL PF 1 MG/ML IJ SOLN
INTRAMUSCULAR | Status: AC
  Filled 2011-06-02: qty 1

## 2011-06-02 MED ORDER — FENTANYL CITRATE 0.05 MG/ML IJ SOLN
100.0000 ug | Freq: Once | INTRAMUSCULAR | Status: AC
  Administered 2011-06-02: 100 ug via INTRAVENOUS
  Filled 2011-06-02: qty 2

## 2011-06-02 MED ORDER — IOHEXOL 300 MG/ML  SOLN
100.0000 mL | Freq: Once | INTRAMUSCULAR | Status: AC | PRN
  Administered 2011-06-02: 100 mL via INTRAVENOUS

## 2011-06-02 MED ORDER — METOCLOPRAMIDE HCL 5 MG/ML IJ SOLN
10.0000 mg | Freq: Four times a day (QID) | INTRAMUSCULAR | Status: DC
  Administered 2011-06-03 – 2011-06-04 (×6): 10 mg via INTRAVENOUS
  Filled 2011-06-02 (×10): qty 2

## 2011-06-02 NOTE — H&P (Signed)
PCP:  [name], MD, MD Chief Complaint:  Abdominal pain, abdominal distention, diarrhea and vomiting of 3 days' duration.  HPI:  Patient is an 78 year old Caucasian male with history of Crohn's disease status post laparotomy secondary to perforation, multiple abdominal surgery, coronary artery disease presenting to the emergency room with 3 days history of generalized abdominal pain with associated abdominal distention. This was said to be associated with multiple episode of nausea and vomiting. Vomiting was nonprojectile with coffee-ground appearance. Patient had multiple episode of diarrhea that was nonbloody. Patient denied any history of fever, chills or Rigors.        In the ED, when patient was seen by me, he had abdominal distention and imaging studies done showed partial bowel obstruction. Patient had NG tube inserted with continuous low pressure suctioning. About 500 cc of coffee-ground fluid was suctioned from the stomach.         After evaluation of patient, he was advised to be admitted and subsequently general surgery was consulted.  Review of Systems:  The patient denies anorexia, fever, weight loss,, vision loss, decreased hearing, hoarseness, chest pain, syncope, dyspnea on exertion, peripheral edema, balance deficits, hemoptysis, abdominal pain++, nausea and vomiting++, melena, hematochezia, severe indigestion/heartburn, hematuria, incontinence, genital sores, muscle weakness, suspicious skin lesions, transient blindness, difficulty walking, depression, unusual weight change, abnormal bleeding, enlarged lymph nodes, angioedema, and breast masses.  Past Medical History:  Past Medical History  Diagnosis Date  . Hypertension   . Hyperlipidemia   . Coronary artery disease     s/p ANTERIOR STEMI 1/09 at [place]; treated with a bare-metal stent to the LAD; Myoview 11/11 EF 71%, no scar, no ischemia;     cath 12/20/10:   pLAD 30-40%, stent ok, ? If stent oversized, EF 60% and  low LVEDP  . GERD (gastroesophageal reflux disease)   . Other esophagitis   . Diaphragmatic hernia without mention of obstruction or gangrene   . Ulcerative (chronic) ileocolitis   . Diverticulosis of colon (without mention of hemorrhage)   . Other chronic nonalcoholic liver disease   . Regional enteritis of small intestine   . Flatulence, eructation, and gas pain   . Ischemic cardiomyopathy     EF initially 35-40% after MI 1/09; echo 7/08: EF 60%    Past Surgical History  Procedure Date  . Cardiac catheterization 09/25/06    EF 35-40% but more recently 60%  . Cholecystectomy   . Appendectomy   . Back surgery     L3, L4, L5    Medications:  Prior to Admission medications   Medication Sig Start Date End Date Taking? Authorizing Provider  aspirin EC 81 MG tablet Take 81 mg by mouth daily.   Yes Historical Provider, MD  atorvastatin (LIPITOR) 40 MG tablet Take 40 mg by mouth daily.     Yes Historical Provider, MD  carvedilol (COREG) 3.125 MG tablet Take 3.125 mg by mouth 2 (two) times daily with a meal.  12/25/10  Yes Historical Provider, MD  cholecalciferol (VITAMIN D) 1000 UNITS tablet Take 2,000 Units by mouth daily.   Yes Historical Provider, MD  cholestyramine Lucrezia Starch) 4 G packet Take 1 packet by mouth daily.   Yes Historical Provider, MD  clopidogrel (PLAVIX) 75 MG tablet Take 75 mg by mouth daily.     Yes Historical Provider, MD  cyanocobalamin 1000 MCG tablet Take 100 mcg by mouth daily.   Yes Historical Provider, MD  isosorbide mononitrate (IMDUR) 30 MG 24 hr tablet Take 30  mg by mouth daily. 12/14/10 12/14/11 Yes Scott Joylene Draft, PA  Lansoprazole (PREVACID PO) Take 1 tablet by mouth daily.    Yes Historical Provider, MD  lisinopril (PRINIVIL,ZESTRIL) 10 MG tablet Take 10 mg by mouth daily.     Yes Historical Provider, MD  mesalamine (PENTASA) 250 MG CR capsule Take 500 mg by mouth 3 (three) times daily.   Yes Historical Provider, MD  metoCLOPramide (REGLAN) 5 MG tablet Take 5  mg by mouth 3 (three) times daily as needed. For nausea.   Yes Historical Provider, MD  nitroGLYCERIN (NITROSTAT) 0.4 MG SL tablet Place 0.4 mg under the tongue every 5 (five) minutes as needed. For chest pain.   Yes Historical Provider, MD  ondansetron (ZOFRAN-ODT) 8 MG disintegrating tablet Take 4-8 mg by mouth every 8 (eight) hours as needed. For nausea.   Yes Historical Provider, MD  POTASSIUM CHLORIDE PO Take 595 mg by mouth daily.    Yes Historical Provider, MD  pyridOXINE (VITAMIN B-6) 100 MG tablet Take 100 mg by mouth daily.     Yes Historical Provider, MD  Testosterone (ANDROGEL PUMP TD) Place 2 application onto the skin daily. 2 pumps once daily    Yes Historical Provider, MD    Allergies:  No Known Allergies  Social History:   reports that he has never smoked. He has never used smokeless tobacco. He reports that he drinks alcohol. He reports that he does not use illicit drugs.  Family History:  Family History  Problem Relation Age of Onset  . Coronary artery disease    . Heart failure Mother   . Emphysema Father   . Heart disease Brother   . Heart disease Brother   . Colon cancer Brother     Physical Exam:  Filed Vitals:   06/02/11 1451 06/02/11 1715 06/02/11 1908  BP: 166/93 161/82 164/74  Pulse: 112 102   Temp: 98 F (36.7 C)    TempSrc: Oral    Resp: 18 18 20   SpO2: 97% 98% 98%      General: Alert and oriented times three, not in any distress, suboptimal hydration.  Eyes: PERRLA, pink conjunctiva, scleral anicterus  ENT: Dry oral mucosa, neck supple, no thyromegaly  Lungs: clear to ascultation, no wheeze, no crackles, no use of accessory muscles  Cardiovascular: regular rate and rhythm, no regurgitation, no gallops, no murmurs. No carotid bruits, no JVD  Abdomen: soft, distended, organs difficult to palpate, bowel sounds are hypoactive  GU: not examined  Neuro: No lateralizing sign  Musculoskeletal: Arthritic change in the knees and feet, no  pedal edema  Skin: Decreased turgor  Psych: appropriate affect  ?  Labs on Admission:   [facility] 06/02/11 1535  NA 137  K 4.0  CL 102  CO2 24  GLUCOSE 150*  BUN 18  CREATININE 0.99  CALCIUM 9.8  MG --  PHOS --     Basename 06/02/11 1535  AST 23  ALT 31  ALKPHOS 68  BILITOT 2.7*  PROT 7.6  ALBUMIN 3.8     Basename 06/02/11 1535  LIPASE 23  AMYLASE --     Basename 06/02/11 1535  WBC 8.8  NEUTROABS 6.5  HGB 16.7  HCT 47.4  MCV 90.8  PLT 198    No results found for this basename: CKTOTAL:3,CKMB:3,CKMBINDEX:3,TROPONINI:3 in the last 72 hours  No results found for this basename: TSH,T4TOTAL,FREET3,T3FREE,THYROIDAB in the last 72 hours  No results found for this basename: VITAMINB12:2,FOLATE:2,FERRITIN:2,TIBC:2,IRON:2,RETICCTPCT:2 in the last 72 hours  Radiological Exams on Admission:  Ct Abdomen Pelvis W Contrast  06/02/2011  *RADIOLOGY REPORT*  Clinical Data: Nausea and vomiting.  Diarrhea.  CT ABDOMEN AND PELVIS WITH CONTRAST  Technique:  Multidetector CT imaging of the abdomen and pelvis was performed following the standard protocol during bolus administration of intravenous contrast.  Contrast: 166m OMNIPAQUE IOHEXOL 300 MG/ML IV SOLN  Comparison: Multiple exams, including 10/24/2009 and 08/31/2006  Findings: Dependent linear subsegmental atelectasis is present in both lower lobes.  Orally administered contrast medium is in the distal esophagus, probably reflecting gastroesophageal reflux.  The stomach appears distended.  Diffuse fatty infiltration of the liver noted.  The gallbladder is absent.  The spleen, pancreas, and adrenal glands appear normal.  The kidneys appear unremarkable, as do the proximal ureters.  No pathologic retroperitoneal or porta hepatis adenopathy is identified.  No pathologic pelvic adenopathy is identified.  Sigmoid diverticulosis is present without active diverticulitis.  Trace pelvic ascites noted.  Small hydroceles noted tracking  in the small bilateral inguinal hernias.  Dilated small bowel up to 4.7 cm noted, with several adjacent transition points in the right upper pelvis representing the transition to nondilated small bowel. There is a short intermediate Reese segment containing a frothy stool like contents.  The appearance favors partial small bowel obstruction.  Low-level edema is present in the small bowel mesentery.  Extensive lumbar hardware fixation and posterior decompression noted.  IMPRESSION:  1.  Dilated stomach and proximal small bowel, most compatible with partial small-bowel obstruction.  Transition points are in the right upper pelvis. 2.  Diffuse hepatic steatosis. 3.  Gastroesophageal reflux. 4.  Sigmoid diverticulosis. 5.  Trace pelvic ascites.  Original Report Authenticated By: WCarron Curie M.D.   Dg Esophagus  05/27/2011  *RADIOLOGY REPORT*  Clinical Data: Dysphasia  ESOPHOGRAM / BARIUM SWALLOW / BARIUM TABLET STUDY  Technique:  Combined double contrast and single contrast examination performed using effervescent crystals, thick barium liquid, and thin barium liquid.  The patient was observed with fluoroscopy swallowing a 173mbarium sulphate tablet.  Fluoroscopy time:  3.31 minutes.  Comparison:  No priors.  Findings:  Esophageal mucosa is normal in appearance.  Multiple single swallow attempts were observed, and normal esophageal motility was noted.  No evidence of obstructing mass, tight stricture, significant esophageal ring or hiatal hernia is evident. However, when the patient swallowed the barium tablet (13 mm in diameter) it remained immediately above the gastroesophageal junction for several minutes, despite repeated consumption of water.  IMPRESSION: 1.  Today's study was generally normal in appearance, with exception of retention of the barium tablet immediately above the gastroesophageal junction, as above.  This would suggest some very mild narrowing in this region (without definite stricture,  significant ring or obstructing mass.  Original Report Authenticated By: DAEtheleen MayhewM.D.    Assessment/Plan  Present on Admission:   Problems: #1 abdominal pain #2 abdomen distention #3 nausea and vomiting-coffee-ground #4 diarrhea #5 dehydration  Impression: #1 partial small bowel obstruction-most likely secondary to bands and adhesions from prior surgery. #2 dehydration #3 history of Crohn disease with perforation status post laparotomy #4 hypertension #5 history of coronary artery disease/ischemic cardiomyopathy #6 hyperlipidemia #7 GERD #8 hiatal hernia  Plan: #1 admit to general medical/surgical floor #2 n.p.o. #3 NG tube inserted with continuous low-pressure suctioning #4 hold all meds #5 maintain blood pressure with IV Vasotec. #6 pain control with IV dilaudid #7 GI prophylaxis with Protonix and DVT prophylaxis with TED hose #8 general surgeon consulted-this  was done by the ED physician #9 labs; CBC, CMP and magnesium repeated in a.m. Patient will be evaluated daily               Jackie Plum                      [phone]

## 2011-06-02 NOTE — Consult Note (Signed)
Reason for Consult: Partial small bowel obstruction and Crohn's disease Referring Physician: Dr. Skip Mayer is an 78 y.o. male.  HPI: Patient has a known history of Crohn's disease since 71. He had a resection at that time by Dr. March Rummage. He is currently followed by Dr. Ardis Hughs from [place]. He developed abdominal distention nausea and vomiting. Evaluation in the emergency department included CT scan which showed partial small bowel obstruction. Patient had some diarrhea yesterday preceding this episode. He claims when this happens he usually is treated with steroids and has good results. He has not required surgery since 1978.  Past Medical History  Diagnosis Date  . Hypertension   . Hyperlipidemia   . Coronary artery disease     s/p ANTERIOR STEMI 1/09 at [place]; treated with a bare-metal stent to the LAD; Myoview 11/11 EF 71%, no scar, no ischemia;     cath 12/20/10:   pLAD 30-40%, stent ok, ? If stent oversized, EF 60% and low LVEDP  . GERD (gastroesophageal reflux disease)   . Other esophagitis   . Diaphragmatic hernia without mention of obstruction or gangrene   . Ulcerative (chronic) ileocolitis   . Diverticulosis of colon (without mention of hemorrhage)   . Other chronic nonalcoholic liver disease   . Regional enteritis of small intestine   . Flatulence, eructation, and gas pain   . Ischemic cardiomyopathy     EF initially 35-40% after MI 1/09; echo 7/08: EF 60%    Past Surgical History  Procedure Date  . Cardiac catheterization 09/25/06    EF 35-40% but more recently 60%  . Cholecystectomy   . Appendectomy   . Back surgery     L3, L4, L5    Family History  Problem Relation Age of Onset  . Coronary artery disease    . Heart failure Mother   . Emphysema Father   . Heart disease Brother   . Heart disease Brother   . Colon cancer Brother     Social History:  reports that he has never smoked. He has never used smokeless tobacco. He reports that he  drinks alcohol. He reports that he does not use illicit drugs.  Allergies: No Known Allergies  Medications: I have reviewed the patient's current medications.  Results for orders placed during the hospital encounter of 06/02/11 (from the past 48 hour(s))  CBC     Status: Normal   Collection Time   06/02/11  3:35 PM      Component Value Range Comment   WBC 8.8  4.0 - 10.5 (K/uL)    RBC 5.22  4.22 - 5.81 (MIL/uL)    Hemoglobin 16.7  13.0 - 17.0 (g/dL)    HCT 47.4  39.0 - 52.0 (%)    MCV 90.8  78.0 - 100.0 (fL)    MCH 32.0  26.0 - 34.0 (pg)    MCHC 35.2  30.0 - 36.0 (g/dL)    RDW 13.8  11.5 - 15.5 (%)    Platelets 198  150 - 400 (K/uL)   DIFFERENTIAL     Status: Normal   Collection Time   06/02/11  3:35 PM      Component Value Range Comment   Neutrophils Relative 74  43 - 77 (%)    Neutro Abs 6.5  1.7 - 7.7 (K/uL)    Lymphocytes Relative 18  12 - 46 (%)    Lymphs Abs 1.6  0.7 - 4.0 (K/uL)    Monocytes Relative  8  3 - 12 (%)    Monocytes Absolute 0.7  0.1 - 1.0 (K/uL)    Eosinophils Relative 0  0 - 5 (%)    Eosinophils Absolute 0.0  0.0 - 0.7 (K/uL)    Basophils Relative 0  0 - 1 (%)    Basophils Absolute 0.0  0.0 - 0.1 (K/uL)   COMPREHENSIVE METABOLIC PANEL     Status: Abnormal   Collection Time   06/02/11  3:35 PM      Component Value Range Comment   Sodium 137  135 - 145 (mEq/L)    Potassium 4.0  3.5 - 5.1 (mEq/L)    Chloride 102  96 - 112 (mEq/L)    CO2 24  19 - 32 (mEq/L)    Glucose, Bld 150 (*) 70 - 99 (mg/dL)    BUN 18  6 - 23 (mg/dL)    Creatinine, Ser 0.99  0.50 - 1.35 (mg/dL)    Calcium 9.8  8.4 - 10.5 (mg/dL)    Total Protein 7.6  6.0 - 8.3 (g/dL)    Albumin 3.8  3.5 - 5.2 (g/dL)    AST 23  0 - 37 (U/L)    ALT 31  0 - 53 (U/L)    Alkaline Phosphatase 68  39 - 117 (U/L)    Total Bilirubin 2.7 (*) 0.3 - 1.2 (mg/dL)    GFR calc non Af Amer 75 (*) >90 (mL/min)    GFR calc Af Amer 87 (*) >90 (mL/min)   LIPASE, BLOOD     Status: Normal   Collection Time   06/02/11   3:35 PM      Component Value Range Comment   Lipase 23  11 - 59 (U/L)   URINALYSIS, ROUTINE W REFLEX MICROSCOPIC     Status: Abnormal   Collection Time   06/02/11  6:13 PM      Component Value Range Comment   Color, Urine YELLOW  YELLOW     APPearance CLEAR  CLEAR     Specific Gravity, Urine 1.026  1.005 - 1.030     pH 5.5  5.0 - 8.0     Glucose, UA NEGATIVE  NEGATIVE (mg/dL)    Hgb urine dipstick NEGATIVE  NEGATIVE     Bilirubin Urine NEGATIVE  NEGATIVE     Ketones, ur 15 (*) NEGATIVE (mg/dL)    Protein, ur NEGATIVE  NEGATIVE (mg/dL)    Urobilinogen, UA 0.2  0.0 - 1.0 (mg/dL)    Nitrite NEGATIVE  NEGATIVE     Leukocytes, UA NEGATIVE  NEGATIVE  MICROSCOPIC NOT DONE ON URINES WITH NEGATIVE PROTEIN, BLOOD, LEUKOCYTES, NITRITE, OR GLUCOSE <1000 mg/dL.    Ct Abdomen Pelvis W Contrast  06/02/2011  *RADIOLOGY REPORT*  Clinical Data: Nausea and vomiting.  Diarrhea.  CT ABDOMEN AND PELVIS WITH CONTRAST  Technique:  Multidetector CT imaging of the abdomen and pelvis was performed following the standard protocol during bolus administration of intravenous contrast.  Contrast: 152m OMNIPAQUE IOHEXOL 300 MG/ML IV SOLN  Comparison: Multiple exams, including 10/24/2009 and 08/31/2006  Findings: Dependent linear subsegmental atelectasis is present in both lower lobes.  Orally administered contrast medium is in the distal esophagus, probably reflecting gastroesophageal reflux.  The stomach appears distended.  Diffuse fatty infiltration of the liver noted.  The gallbladder is absent.  The spleen, pancreas, and adrenal glands appear normal.  The kidneys appear unremarkable, as do the proximal ureters.  No pathologic retroperitoneal or porta hepatis adenopathy is identified.  No pathologic  pelvic adenopathy is identified.  Sigmoid diverticulosis is present without active diverticulitis.  Trace pelvic ascites noted.  Small hydroceles noted tracking in the small bilateral inguinal hernias.  Dilated small bowel up  to 4.7 cm noted, with several adjacent transition points in the right upper pelvis representing the transition to nondilated small bowel. There is a short intermediate Reese segment containing a frothy stool like contents.  The appearance favors partial small bowel obstruction.  Low-level edema is present in the small bowel mesentery.  Extensive lumbar hardware fixation and posterior decompression noted.  IMPRESSION:  1.  Dilated stomach and proximal small bowel, most compatible with partial small-bowel obstruction.  Transition points are in the right upper pelvis. 2.  Diffuse hepatic steatosis. 3.  Gastroesophageal reflux. 4.  Sigmoid diverticulosis. 5.  Trace pelvic ascites.  Original Report Authenticated By: Carron Curie, M.D.    Review of Systems  Constitutional: Negative.   HENT: Negative.   Eyes: Negative.   Respiratory: Negative.  Negative for cough and shortness of breath.   Cardiovascular: Negative.  Negative for chest pain.  Gastrointestinal: Positive for nausea, vomiting, abdominal pain and diarrhea.  Genitourinary: Negative.   Musculoskeletal: Negative.   Skin: Negative.   Neurological: Negative.    Blood pressure 140/87, pulse 109, temperature 97.7 F (36.5 C), temperature source Oral, resp. rate 20, height 5' 10"  (1.778 m), weight 78 kg (171 lb 15.3 oz), SpO2 95.00%. Physical Exam  Constitutional: He is oriented to person, place, and time. He appears well-developed and well-nourished.  HENT:  Head: Normocephalic and atraumatic.  Neck: Normal range of motion. Neck supple. No tracheal deviation present.  Cardiovascular: Normal rate, normal heart sounds and intact distal pulses.   Respiratory: Effort normal and breath sounds normal. No respiratory distress. He has no wheezes.  GI: Soft. He exhibits distension. There is no tenderness. There is no rebound and no guarding.       Few Bowel sounds, some tympany  Musculoskeletal: Normal range of motion.  Neurological: He is  alert and oriented to person, place, and time.    Assessment/Plan: Partial small bowel obstruction from Crohn's disease. Recommend continuing nasogastric tube decompression. Recommend GI consultation. We will follow with you.  Lorelle Macaluso E 06/02/2011, 11:05 PM

## 2011-06-02 NOTE — ED Notes (Signed)
Pt reports having n/v yesterday and diarrhea. Denies any blood in stool, today abd is distended and having pain. Sent here by dr hung

## 2011-06-02 NOTE — ED Notes (Signed)
Pt unable to void at this time. 

## 2011-06-02 NOTE — ED Notes (Signed)
Output from NG tube.  Bloody fluid

## 2011-06-02 NOTE — ED Provider Notes (Signed)
History     CSN: [id number]  Arrival date & time 06/02/11  1442   First MD Initiated Contact with Patient 06/02/11 1529      Chief Complaint  Patient presents with  . Abdominal Pain  . Emesis    (Consider location/radiation/quality/duration/timing/severity/associated sxs/prior treatment) Patient is a 78 y.o. male presenting with abdominal pain and vomiting. The history is provided by the patient.  Abdominal Pain The primary symptoms of the illness include abdominal pain, nausea and vomiting. The primary symptoms of the illness do not include shortness of breath or diarrhea.  Symptoms associated with the illness do not include back pain.  Emesis  Associated symptoms include abdominal pain. Pertinent negatives include no diarrhea and no headaches.   patient has had abdominal pain since yesterday. he's had nausea vomiting. He states he had a normal stool this morning. He is a history of Crohn's disease with previous resections. He states his abdomen is more distended. He talked to Dr. Benson Norway who is on call for gastroenterology who sent him in for evaluation. No fevers. He states he's had a decreased appetite. No blood in the vomitus or stool   Past Medical History  Diagnosis Date  . Hypertension   . Hyperlipidemia   . Coronary artery disease     s/p ANTERIOR STEMI 1/09 at [place]; treated with a bare-metal stent to the LAD; Myoview 11/11 EF 71%, no scar, no ischemia;     cath 12/20/10:   pLAD 30-40%, stent ok, ? If stent oversized, EF 60% and low LVEDP  . GERD (gastroesophageal reflux disease)   . Other esophagitis   . Diaphragmatic hernia without mention of obstruction or gangrene   . Ulcerative (chronic) ileocolitis   . Diverticulosis of colon (without mention of hemorrhage)   . Other chronic nonalcoholic liver disease   . Regional enteritis of small intestine   . Flatulence, eructation, and gas pain   . Ischemic cardiomyopathy     EF initially 35-40% after MI 1/09; echo 7/08: EF  60%    Past Surgical History  Procedure Date  . Cardiac catheterization 09/25/06    EF 35-40% but more recently 60%  . Cholecystectomy   . Appendectomy   . Back surgery     L3, L4, L5    Family History  Problem Relation Age of Onset  . Coronary artery disease    . Heart failure Mother   . Emphysema Father   . Heart disease Brother   . Heart disease Brother   . Colon cancer Brother     History  Substance Use Topics  . Smoking status: Never Smoker   . Smokeless tobacco: Never Used  . Alcohol Use: Yes     occ.      Review of Systems  Constitutional: Negative for activity change and appetite change.  HENT: Negative for neck stiffness.   Eyes: Negative for pain.  Respiratory: Negative for chest tightness and shortness of breath.   Cardiovascular: Negative for chest pain and leg swelling.  Gastrointestinal: Positive for nausea, vomiting and abdominal pain. Negative for diarrhea.  Genitourinary: Negative for flank pain.  Musculoskeletal: Negative for back pain.  Skin: Negative for rash.  Neurological: Negative for weakness, numbness and headaches.  Psychiatric/Behavioral: Negative for behavioral problems.    Allergies  Review of patient's allergies indicates no known allergies.  Home Medications   Current Outpatient Rx  Name Route Sig Dispense Refill  . ASPIRIN EC 81 MG PO TBEC Oral Take 81 mg by  mouth daily.    . ATORVASTATIN CALCIUM 40 MG PO TABS Oral Take 40 mg by mouth daily.      Marland Kitchen CARVEDILOL 3.125 MG PO TABS Oral Take 3.125 mg by mouth 2 (two) times daily with a meal.     . VITAMIN D3 1000 UNITS PO TABS Oral Take 2,000 Units by mouth daily.    . CHOLESTYRAMINE 4 G PO PACK Oral Take 1 packet by mouth daily.    Marland Kitchen CLOPIDOGREL BISULFATE 75 MG PO TABS Oral Take 75 mg by mouth daily.      . CYANOCOBALAMIN 1000 MCG PO TABS Oral Take 100 mcg by mouth daily.    . ISOSORBIDE MONONITRATE ER 30 MG PO TB24 Oral Take 30 mg by mouth daily.    Marland Kitchen PREVACID PO Oral Take 1  tablet by mouth daily.     Marland Kitchen LISINOPRIL 10 MG PO TABS Oral Take 10 mg by mouth daily.      Marland Kitchen MESALAMINE 250 MG PO CPCR Oral Take 500 mg by mouth 3 (three) times daily.    Marland Kitchen METOCLOPRAMIDE HCL 5 MG PO TABS Oral Take 5 mg by mouth 3 (three) times daily as needed. For nausea.    Marland Kitchen NITROGLYCERIN 0.4 MG SL SUBL Sublingual Place 0.4 mg under the tongue every 5 (five) minutes as needed. For chest pain.    Marland Kitchen ONDANSETRON 8 MG PO TBDP Oral Take 4-8 mg by mouth every 8 (eight) hours as needed. For nausea.    Marland Kitchen POTASSIUM CHLORIDE PO Oral Take 595 mg by mouth daily.     Marland Kitchen VITAMIN B-6 100 MG PO TABS Oral Take 100 mg by mouth daily.      . ANDROGEL PUMP TD Transdermal Place 2 application onto the skin daily. 2 pumps once daily       BP 164/74  Pulse 102  Temp(Src) 98 F (36.7 C) (Oral)  Resp 20  SpO2 98%  Physical Exam  Nursing note and vitals reviewed. Constitutional: He is oriented to person, place, and time. He appears well-developed and well-nourished.  HENT:  Head: Normocephalic and atraumatic.  Eyes: EOM are normal. Pupils are equal, round, and reactive to light.  Neck: Normal range of motion. Neck supple.  Cardiovascular: Regular rhythm and normal heart sounds.   No murmur heard. Pulmonary/Chest: Effort normal and breath sounds normal.  Abdominal: Soft. Bowel sounds are normal. He exhibits distension and mass. There is tenderness. There is no rebound.       Midline scar from previous surgery  Musculoskeletal: Normal range of motion. He exhibits no edema.  Neurological: He is alert and oriented to person, place, and time. No cranial nerve deficit.  Skin: Skin is warm and dry.  Psychiatric: He has a normal mood and affect.    ED Course  Procedures (including critical care time)  Labs Reviewed  COMPREHENSIVE METABOLIC PANEL - Abnormal; Notable for the following:    Glucose, Bld 150 (*)    Total Bilirubin 2.7 (*)    GFR calc non Af Amer 75 (*)    GFR calc Af Amer 87 (*)    All other  components within normal limits  URINALYSIS, ROUTINE W REFLEX MICROSCOPIC - Abnormal; Notable for the following:    Ketones, ur 15 (*)    All other components within normal limits  CBC  DIFFERENTIAL  LIPASE, BLOOD   Ct Abdomen Pelvis W Contrast  06/02/2011  *RADIOLOGY REPORT*  Clinical Data: Nausea and vomiting.  Diarrhea.  CT ABDOMEN AND  PELVIS WITH CONTRAST  Technique:  Multidetector CT imaging of the abdomen and pelvis was performed following the standard protocol during bolus administration of intravenous contrast.  Contrast: 14m OMNIPAQUE IOHEXOL 300 MG/ML IV SOLN  Comparison: Multiple exams, including 10/24/2009 and 08/31/2006  Findings: Dependent linear subsegmental atelectasis is present in both lower lobes.  Orally administered contrast medium is in the distal esophagus, probably reflecting gastroesophageal reflux.  The stomach appears distended.  Diffuse fatty infiltration of the liver noted.  The gallbladder is absent.  The spleen, pancreas, and adrenal glands appear normal.  The kidneys appear unremarkable, as do the proximal ureters.  No pathologic retroperitoneal or porta hepatis adenopathy is identified.  No pathologic pelvic adenopathy is identified.  Sigmoid diverticulosis is present without active diverticulitis.  Trace pelvic ascites noted.  Small hydroceles noted tracking in the small bilateral inguinal hernias.  Dilated small bowel up to 4.7 cm noted, with several adjacent transition points in the right upper pelvis representing the transition to nondilated small bowel. There is a short intermediate Reese segment containing a frothy stool like contents.  The appearance favors partial small bowel obstruction.  Low-level edema is present in the small bowel mesentery.  Extensive lumbar hardware fixation and posterior decompression noted.  IMPRESSION:  1.  Dilated stomach and proximal small bowel, most compatible with partial small-bowel obstruction.  Transition points are in the right  upper pelvis. 2.  Diffuse hepatic steatosis. 3.  Gastroesophageal reflux. 4.  Sigmoid diverticulosis. 5.  Trace pelvic ascites.  Original Report Authenticated By: WCarron Curie M.D.     1. Partial small bowel obstruction   2. Crohn disease       MDM  Abdominal pain nausea vomiting and some diarrhea. Patient with a history of Crohn's disease. Partial small bowel obstruction on CT. NG tube was placed. He'll be admitted to medicine with GI and surgical consults.        NJasper Riling PAlvino Chapel MD 06/02/11 [phone]

## 2011-06-03 ENCOUNTER — Encounter (HOSPITAL_COMMUNITY): Payer: Self-pay | Admitting: Physician Assistant

## 2011-06-03 ENCOUNTER — Telehealth: Payer: Self-pay | Admitting: Gastroenterology

## 2011-06-03 DIAGNOSIS — K5090 Crohn's disease, unspecified, without complications: Secondary | ICD-10-CM

## 2011-06-03 DIAGNOSIS — K56609 Unspecified intestinal obstruction, unspecified as to partial versus complete obstruction: Secondary | ICD-10-CM

## 2011-06-03 DIAGNOSIS — K5000 Crohn's disease of small intestine without complications: Principal | ICD-10-CM

## 2011-06-03 LAB — CBC
HCT: 43.4 % (ref 39.0–52.0)
Hemoglobin: 14.7 g/dL (ref 13.0–17.0)
MCH: 31.1 pg (ref 26.0–34.0)
MCHC: 33.9 g/dL (ref 30.0–36.0)
MCV: 91.9 fL (ref 78.0–100.0)
Platelets: 194 10*3/uL (ref 150–400)
RBC: 4.72 MIL/uL (ref 4.22–5.81)
RDW: 13.9 % (ref 11.5–15.5)
WBC: 8 10*3/uL (ref 4.0–10.5)

## 2011-06-03 LAB — DIFFERENTIAL
Basophils Absolute: 0 10*3/uL (ref 0.0–0.1)
Basophils Relative: 0 % (ref 0–1)
Eosinophils Absolute: 0.1 10*3/uL (ref 0.0–0.7)
Eosinophils Relative: 1 % (ref 0–5)
Lymphocytes Relative: 25 % (ref 12–46)
Lymphs Abs: 2 10*3/uL (ref 0.7–4.0)
Monocytes Absolute: 1.3 10*3/uL — ABNORMAL HIGH (ref 0.1–1.0)
Monocytes Relative: 16 % — ABNORMAL HIGH (ref 3–12)
Neutro Abs: 4.7 10*3/uL (ref 1.7–7.7)
Neutrophils Relative %: 59 % (ref 43–77)

## 2011-06-03 LAB — COMPREHENSIVE METABOLIC PANEL
ALT: 23 U/L (ref 0–53)
AST: 18 U/L (ref 0–37)
Albumin: 3.2 g/dL — ABNORMAL LOW (ref 3.5–5.2)
Alkaline Phosphatase: 54 U/L (ref 39–117)
BUN: 18 mg/dL (ref 6–23)
CO2: 28 mEq/L (ref 19–32)
Calcium: 9.1 mg/dL (ref 8.4–10.5)
Chloride: 101 mEq/L (ref 96–112)
Creatinine, Ser: 1.14 mg/dL (ref 0.50–1.35)
GFR calc Af Amer: 68 mL/min — ABNORMAL LOW (ref >90)
GFR calc non Af Amer: 59 mL/min — ABNORMAL LOW (ref >90)
Glucose, Bld: 122 mg/dL — ABNORMAL HIGH (ref 70–99)
Potassium: 4.1 mEq/L (ref 3.5–5.1)
Sodium: 139 mEq/L (ref 135–145)
Total Bilirubin: 2.2 mg/dL — ABNORMAL HIGH (ref 0.3–1.2)
Total Protein: 6.2 g/dL (ref 6.0–8.3)

## 2011-06-03 LAB — MAGNESIUM: Magnesium: 1.6 mg/dL (ref 1.5–2.5)

## 2011-06-03 MED ORDER — PREDNISONE 50 MG PO TABS
50.0000 mg | ORAL_TABLET | Freq: Every day | ORAL | Status: DC
  Filled 2011-06-03: qty 1

## 2011-06-03 MED ORDER — METHYLPREDNISOLONE SODIUM SUCC 40 MG IJ SOLR
40.0000 mg | Freq: Every day | INTRAMUSCULAR | Status: DC
Start: 2011-06-03 — End: 2011-06-05
  Administered 2011-06-03 – 2011-06-05 (×3): 40 mg via INTRAVENOUS
  Filled 2011-06-03 (×3): qty 1

## 2011-06-03 NOTE — Progress Notes (Signed)
Patient seen and examined.  Distant history of Crohn's resection by Dr. Gretta Cool.  Patient now passing flatus.  Mild left abd tenderness.  Mild distension.  Will ask [facility] to eval at patient request.  Will follow. Earnstine Regal, MD, [facility]

## 2011-06-03 NOTE — Progress Notes (Signed)
Subjective: Pt admitted last pm with PSBO secondary to Crohn's.  Has had about 2L NG output and feels better some this am. He also says he passed some flatus this am.  Objective: Vital signs in last 24 hours: Temp:  [97.5 F (36.4 C)-98 F (36.7 C)] 97.5 F (36.4 C) (02/25 0617) Pulse Rate:  [101-112] 105  (02/25 0617) Resp:  [18-24] 20  (02/25 0617) BP: (140-166)/(72-93) 142/72 mmHg (02/25 0617) SpO2:  [95 %-98 %] 98 % (02/25 0617) Weight:  [78 kg (171 lb 15.3 oz)] 78 kg (171 lb 15.3 oz) (02/24 2200) Last BM Date: 06/02/11  Intake/Output this shift:    Physical Exam: BP 142/72  Pulse 105  Temp(Src) 97.5 F (36.4 C) (Oral)  Resp 20  Ht 5' 10"  (1.778 m)  Wt 78 kg (171 lb 15.3 oz)  BMI 24.67 kg/m2  SpO2 98% Abdomen: remains distended, but soft. MIld tenderness low abd  Labs: CBC  Basename 06/03/11 0510 06/02/11 1535  WBC 8.0 8.8  HGB 14.7 16.7  HCT 43.4 47.4  PLT 194 198   BMET  Basename 06/03/11 0510 06/02/11 1535  NA 139 137  K 4.1 4.0  CL 101 102  CO2 28 24  GLUCOSE 122* 150*  BUN 18 18  CREATININE 1.14 0.99  CALCIUM 9.1 9.8   LFT  Basename 06/03/11 0510 06/02/11 1535  PROT 6.2 --  ALBUMIN 3.2* --  AST 18 --  ALT 23 --  ALKPHOS 54 --  BILITOT 2.2* --  BILIDIR -- --  IBILI -- --  LIPASE -- 23   PT/INR No results found for this basename: LABPROT:2,INR:2 in the last 72 hours ABG No results found for this basename: PHART:2,PCO2:2,PO2:2,HCO3:2 in the last 72 hours  Studies/Results: Ct Abdomen Pelvis W Contrast  06/02/2011  *RADIOLOGY REPORT*  Clinical Data: Nausea and vomiting.  Diarrhea.  CT ABDOMEN AND PELVIS WITH CONTRAST  Technique:  Multidetector CT imaging of the abdomen and pelvis was performed following the standard protocol during bolus administration of intravenous contrast.  Contrast: 121m OMNIPAQUE IOHEXOL 300 MG/ML IV SOLN  Comparison: Multiple exams, including 10/24/2009 and 08/31/2006  Findings: Dependent linear subsegmental  atelectasis is present in both lower lobes.  Orally administered contrast medium is in the distal esophagus, probably reflecting gastroesophageal reflux.  The stomach appears distended.  Diffuse fatty infiltration of the liver noted.  The gallbladder is absent.  The spleen, pancreas, and adrenal glands appear normal.  The kidneys appear unremarkable, as do the proximal ureters.  No pathologic retroperitoneal or porta hepatis adenopathy is identified.  No pathologic pelvic adenopathy is identified.  Sigmoid diverticulosis is present without active diverticulitis.  Trace pelvic ascites noted.  Small hydroceles noted tracking in the small bilateral inguinal hernias.  Dilated small bowel up to 4.7 cm noted, with several adjacent transition points in the right upper pelvis representing the transition to nondilated small bowel. There is a short intermediate Reese segment containing a frothy stool like contents.  The appearance favors partial small bowel obstruction.  Low-level edema is present in the small bowel mesentery.  Extensive lumbar hardware fixation and posterior decompression noted.  IMPRESSION:  1.  Dilated stomach and proximal small bowel, most compatible with partial small-bowel obstruction.  Transition points are in the right upper pelvis. 2.  Diffuse hepatic steatosis. 3.  Gastroesophageal reflux. 4.  Sigmoid diverticulosis. 5.  Trace pelvic ascites.  Original Report Authenticated By: WCarron Curie M.D.    Assessment: PSBO Crohn's     Plan:  NGT decompression. Again rec GI eval as pt states he responds well to steroids for these flares. Will cont to follow.  LOS: 1 day    Kandis Fantasia 06/03/2011 9:41 AM

## 2011-06-03 NOTE — Telephone Encounter (Signed)
On call note at 2030. Pts son [name] [name] called to get update on condition of his father who is an inpatient at [facility]. He states that patient does not always provide complete information to the family and his wife and children would like updates on his condition and treatment plans. I reviewed Dr. Celesta Aver consult note from today and surgical notes. NG suction and IV corticosteroid treatment for SBO with possible Crohn's flare plan, possible adhesions or possible SB stricture was communicated to [name] and he appreciated the update and understands the current treatment plan. He will convey this information to the patients wife and other family members.

## 2011-06-03 NOTE — Progress Notes (Signed)
Subjective: I say that Mr [name] [name] popped up on my census this am after being admitted by the hospitalists last night for PSBO.  Already evaluated by Gen Surgery and GI consult is Pending (I believe).  NGT in place and Ab less distende.  Feeling better but 2 buckets of Dark GI material has been suctioned out.  No fever.  No current pain.  PMH:  Crohn's--Dr Jacob's,  GERD,  Hyperlipidemia,  Nephrolithiasis,  Osteoporosis,  CAD,   HTN,   DDD,   Melanoma,  H/O GI Bleeding/hemorrhage,  Shingles,   Sq Cell CA--S/P Mohs--Dr Link Snuffer 4/11,  H/O  ATN/ARF @ 2006 with Crohns exac and MI,  Ventral Hernia,  Hypogonadism  10/2009 [place] There is marked, diffuse fatty infiltration of the Liver PSH: R Eye Catarct 12/2009, L cataract 06/2010 L3-S1 Fusion/8 Screws with Dr Ellene Route. S/P Chole Back operations/titanium rods CAD MI 5/08, anterior MI 99% prox LAD PCI/stent x 2 Bare metal S/P Bowel resection ascending colon & ileus 1978 S/P Moh's   Objective: Vital signs in last 24 hours: Temp:  [97.5 F (36.4 C)-98 F (36.7 C)] 97.5 F (36.4 C) (02/25 0617) Pulse Rate:  [101-112] 105  (02/25 0617) Resp:  [18-24] 20  (02/25 0617) BP: (140-166)/(72-93) 142/72 mmHg (02/25 0617) SpO2:  [95 %-98 %] 98 % (02/25 0617) Weight:  [78 kg (171 lb 15.3 oz)] 78 kg (171 lb 15.3 oz) (02/24 2200) Weight change:  Last BM Date: 06/02/11  CBG (last 3)  No results found for this basename: GLUCAP:3 in the last 72 hours  Intake/Output from previous day:  Intake/Output Summary (Last 24 hours) at 06/03/11 0940 Last data filed at 06/03/11 0617  Gross per 24 hour  Intake    800 ml  Output   1500 ml  Net   -700 ml   02/24 0701 - 02/25 0700 In: 800 [I.V.:800] Out: 1500    Physical Exam General appearance: A and o Eyes: no scleral icterus Throat: oropharynx moist without erythema Resp: CTA Cardio: Reg GI: softer, distended.  NGT in pace. Extremities: no clubbing, cyanosis or edema   Lab Results:  [facility] 06/03/11 0510  06/02/11 1535  NA 139 137  K 4.1 4.0  CL 101 102  CO2 28 24  GLUCOSE 122* 150*  BUN 18 18  CREATININE 1.14 0.99  CALCIUM 9.1 9.8  MG 1.6 --  PHOS -- --     Basename 06/03/11 0510 06/02/11 1535  AST 18 23  ALT 23 31  ALKPHOS 54 68  BILITOT 2.2* 2.7*  PROT 6.2 7.6  ALBUMIN 3.2* 3.8     Basename 06/03/11 0510 06/02/11 1535  WBC 8.0 8.8  NEUTROABS 4.7 6.5  HGB 14.7 16.7  HCT 43.4 47.4  MCV 91.9 90.8  PLT 194 198    Lab Results  Component Value Date   INR 1.0 12/14/2010    No results found for this basename: CKTOTAL:3,CKMB:3,CKMBINDEX:3,TROPONINI:3 in the last 72 hours  No results found for this basename: TSH,T4TOTAL,FREET3,T3FREE,THYROIDAB in the last 72 hours  No results found for this basename: VITAMINB12:2,FOLATE:2,FERRITIN:2,TIBC:2,IRON:2,RETICCTPCT:2 in the last 72 hours  Micro Results: No results found for this or any previous visit (from the past 240 hour(s)).   Studies/Results: Ct Abdomen Pelvis W Contrast  06/02/2011  *RADIOLOGY REPORT*  Clinical Data: Nausea and vomiting.  Diarrhea.  CT ABDOMEN AND PELVIS WITH CONTRAST  Technique:  Multidetector CT imaging of the abdomen and pelvis was performed following the standard protocol during bolus administration of intravenous contrast.  Contrast: 173m OMNIPAQUE IOHEXOL 300 MG/ML IV SOLN  Comparison: Multiple exams, including 10/24/2009 and 08/31/2006  Findings: Dependent linear subsegmental atelectasis is present in both lower lobes.  Orally administered contrast medium is in the distal esophagus, probably reflecting gastroesophageal reflux.  The stomach appears distended.  Diffuse fatty infiltration of the liver noted.  The gallbladder is absent.  The spleen, pancreas, and adrenal glands appear normal.  The kidneys appear unremarkable, as do the proximal ureters.  No pathologic retroperitoneal or porta hepatis adenopathy is identified.  No pathologic pelvic adenopathy is identified.  Sigmoid diverticulosis is present  without active diverticulitis.  Trace pelvic ascites noted.  Small hydroceles noted tracking in the small bilateral inguinal hernias.  Dilated small bowel up to 4.7 cm noted, with several adjacent transition points in the right upper pelvis representing the transition to nondilated small bowel. There is a short intermediate Reese segment containing a frothy stool like contents.  The appearance favors partial small bowel obstruction.  Low-level edema is present in the small bowel mesentery.  Extensive lumbar hardware fixation and posterior decompression noted.  IMPRESSION:  1.  Dilated stomach and proximal small bowel, most compatible with partial small-bowel obstruction.  Transition points are in the right upper pelvis. 2.  Diffuse hepatic steatosis. 3.  Gastroesophageal reflux. 4.  Sigmoid diverticulosis. 5.  Trace pelvic ascites.  Original Report Authenticated By: WCarron Curie M.D.     Medications: Scheduled:   . antiseptic oral rinse  15 mL Mouth Rinse q12n4p  . chlorhexidine  15 mL Mouth Rinse BID  . enalaprilat  1.25 mg Intravenous Q6H  . fentaNYL  100 mcg Intravenous Once  .  HYDROmorphone (DILAUDID) injection  1 mg Intravenous Once  .  HYDROmorphone (DILAUDID) injection  1 mg Intravenous STAT  . metoCLOPramide (REGLAN) injection  10 mg Intravenous Q6H  . ondansetron  4 mg Intravenous Once  . pantoprazole (PROTONIX) IV  40 mg Intravenous Q24H  . DISCONTD: iohexol  20 mL Oral Q1 Hr x 2   Continuous:   . sodium chloride 100 mL/hr at 06/03/11 04742    Assessment/Plan: Active Problems:  * No active hospital problems. *  Problem #1 PSBO with Ab Pain/AB Distension/N/V- Coffee ground- Undergoing conservative management with NPO and NGT.  Await GI consult. Pain under control.  Surgery on board.  Hopefully will improve with time.  May be more Adhesions or small hernia related rather than Crohn's flare - await GI to comment.  Problem # 2:  Dehydration - IVF  Problem # 3:  CORONARY  ARTERY DISEASE (ICD-414.00) With H/O MI CAD with recent (-) Cath. H/O  ATN/ARF @ 2006 with Crohns exac and MI Per Dr BHaroldine Laws  Doing well no Angina. Tolerating Coreg  3.125 BID.   Problem # 4:  ESSENTIAL HYPERTENSION (ICD-401.9) OutPt meds include: 140's now    Lisinopril 10 Mg Tabs (Lisinopril) ..Marland Kitchen.. Take one tablet daily    Carvedilol 3.125 Mg Tabs (Carvedilol) ..... One po bid   Problem # 5:  CROHN'S DISEASE (ICD-555.9)  Crohn's--Dr Jacob's Now on Cholestyramine and Mesalamine. Saw Dr JArdis Hughs2/2012. Will need GI input during hospital stay  Problem # 6:  GERD (ICD-530.81) PPI. Off the reglan.  Problem # 7:  OSTEOPOROSIS (ICD-733.00)  08/2009- DEXA c/w Osteopenia by WHO criteria with stable bone Density compared to 2008.  T scores of Femoral neck is @ -2.0.  L/S back has OA, thicker bone and the cage.  L/S spine is fine when it comes  to bone density.  Frax score is high though.  He is not on Prednisone and only on Pentasa for the colitis.  He could probably benefit from bone building treatments but I am afraid of upsetting his GI system.  Since his DXA is stable I will hold on med adjustments at this time, repeat DXA in 2 years and keep an ongoing discussion active. F/Up 2013.    Problem # 8:  HYPERLIPIDEMIA (GEZ-662.4)  DVT Prophylaxis - TED Hose  Since he is my patient and complex and would be appropriate on my service - I will move him to me  Complete OutPatiet Medication List: 1)  Lipitor 40 Mg Tabs (Atorvastatin calcium) .... Take one tablet daily 2)  Lisinopril 10 Mg Tabs (Lisinopril) .... Take one tablet daily 3)  Nitroglycerin 0.4 Mg Subl (Nitroglycerin) .... Use one under the tongue as needed 4)  Pentasa 250 Mg Cr-caps (Mesalamine) .... Take two tabs three times a day 5)  Prevacid 30 Mg Cpdr (Lansoprazole) .... Take one tablet daily 6)  Plavix 75 Mg Tabs (Clopidogrel bisulfate) .... Take one tablet daily 7)  Caltrate 600+d Tabs (Calcium carbonate-vitamin d  tabs) .... Take one tablet by mouth twice daily 8)  Aspirin 81 Mg Ec Tab (Aspirin) .... Take one (1) tablet by mouth daily 9)  Carvedilol 3.125 Mg Tabs (Carvedilol) .... One po bid 10)  Potassium Chloride 90 Mg  .... One tab daily 11)  Vitamin D3 Tabs (Cholecalciferol tabs) .... One tab daily 12)  Multivitamins Tabs (Multiple vitamin) .... Take one tablet by mouth every day 13)  Align Caps (Probiotic product) .... One po every day 14)  Reglan 5 Mg Tabs (Metoclopramide hcl) .... For stomach when needed one tab every 8 hours 15)  Cholestyramine Light 4 Gm/dose Powd (Cholestyramine light) .... One scoop daily 16)  Isosorbide Mononitrate Cr 30 Mg Xr24h-tab (Isosorbide mononitrate) .... One capsule by mouth daily 17)  Androgel Pump 1.25 Gm/act (1%) Gel (Testosterone) .... Two pumps once per day to shoulders/chest daily    LOS: 1 day   Vesna Kable M 06/03/2011, 9:40 AM

## 2011-06-03 NOTE — Consult Note (Addendum)
Gastro Consult: 12:29 PM 06/03/2011   Referring Provider: Dr Virgina Jock Primary Care Physician:  [name], MD,  Primary Gastroenterologist:  Dr. Ardis Hughs   Reason for Consultation:  Partial SBO in pt with hx Crohns disease.    HPI: [name] [name] is a 78 y.o. male.  Hx of Crohn's dz with remote 1978 ileo/right colectomy.  Maintenance meds are Pentasa, Entocort and Purinethol 50 mg.  Pt felt better on 100 mg of Purinethol, but it caused rise in LFTs and had to be decreased back to 14m.  Last colonoscopy was 2009:  Crohns activity at distal 10 cm of ileum and anastomosis, with stricturing at anastomosis.  Had PSBO managed medically in May 2008, ilus post spine surgery not felt to be from crohn's activity in summer 2010.  Loose stools improved with addition of Cholestyramine in 02/2010.  Seen in office on 2/15 for acute dyspahagia event.  It resolved after a while.  His primary MD added low dose po Reglan, which Dr JArdis Hughselected to continue.  Dr JArdis Hughselected to do esophagram rather than EGD, as pt takes Plavix.  The [place] so pt was set up for EGD on 06/12/11 off plavix (ok with Dr BHaroldine Laws  Now admitted yesterday with 3 days of brown, watery diarrhea, diffuse abdominal pain and distention.  Nausea and vomitting began about 24 hours before arrival at ED yesterday.   Coffee ground emesis present from the beginning .   Ct abdomen showed partial SBO, transition point at right upper pelvis.  Incidental diffuse liver steatosis and trace pelvic ascites, GERD also noted.  NG tube placed around 3 PM yesterday.  It has put out about 2 litres since placed yesterday.     Pt says that when he has had SBO in past that sxs resolved with steroid treatment.   Today he is passing flatus but no stools yet.  Last BM was early yesterday and stool was still brown and loose. Normal stools are formed, 3 to 4 per day, after he eats.     Agree - no  changes - CGatha Mayer MD, [facility]  Past Medical History  Diagnosis Date  . Hypertension   . Hyperlipidemia   . Coronary artery disease     s/p [facility] treated with a bare-metal stent to the LAD; Myoview 11/11 EF 71%, no scar, no ischemia;     cath 12/20/10:   pLAD 30-40%, stent ok, ? If stent oversized, EF 60% and low LVEDP  . GERD (gastroesophageal reflux disease)   . Other esophagitis   . Diaphragmatic hernia without mention of obstruction or gangrene   . Ulcerative (chronic) ileocolitis   . Diverticulosis of colon (without mention of hemorrhage)   . Other chronic nonalcoholic liver disease   . Regional enteritis of small intestine   . Flatulence, eructation, and gas pain   . Ischemic cardiomyopathy     EF initially 35-40% after MI 1/09; echo 7/08: EF 60%    Past Surgical History  Procedure Date  . Cardiac catheterization 09/25/06    EF 35-40% but more recently 60%  . Cholecystectomy   . Appendectomy   . Back surgery     L3, L4, L5    Prior to Admission medications   Medication Sig Start Date End Date Taking? Authorizing Provider  aspirin EC 81 MG tablet Take 81 mg by mouth daily.   Yes Historical Provider, MD  atorvastatin (LIPITOR) 40  MG tablet Take 40 mg by mouth daily.     Yes Historical Provider, MD  carvedilol (COREG) 3.125 MG tablet Take 3.125 mg by mouth 2 (two) times daily with a meal.  12/25/10  Yes Historical Provider, MD  cholecalciferol (VITAMIN D) 1000 UNITS tablet Take 2,000 Units by mouth daily.   Yes Historical Provider, MD  cholestyramine Lucrezia Starch) 4 G packet Take 1 packet by mouth daily.   Yes Historical Provider, MD  clopidogrel (PLAVIX) 75 MG tablet Take 75 mg by mouth daily.     Yes Historical Provider, MD  cyanocobalamin 1000 MCG tablet Take 100 mcg by mouth daily.   Yes Historical Provider, MD  isosorbide mononitrate (IMDUR) 30 MG 24 hr tablet Take 30 mg by mouth daily. 12/14/10 12/14/11 Yes Scott Joylene Draft, PA  Lansoprazole  (PREVACID PO) Take 1 tablet by mouth daily.    Yes Historical Provider, MD  lisinopril (PRINIVIL,ZESTRIL) 10 MG tablet Take 10 mg by mouth daily.     Yes Historical Provider, MD  mesalamine (PENTASA) 250 MG CR capsule Take 500 mg by mouth 3 (three) times daily.   Yes Historical Provider, MD  metoCLOPramide (REGLAN) 5 MG tablet Take 5 mg by mouth 3 (three) times daily as needed. For nausea.   Yes Historical Provider, MD  nitroGLYCERIN (NITROSTAT) 0.4 MG SL tablet Place 0.4 mg under the tongue every 5 (five) minutes as needed. For chest pain.   Yes Historical Provider, MD  ondansetron (ZOFRAN-ODT) 8 MG disintegrating tablet Take 4-8 mg by mouth every 8 (eight) hours as needed. For nausea.   Yes Historical Provider, MD  POTASSIUM CHLORIDE PO Take 595 mg by mouth daily.    Yes Historical Provider, MD  pyridOXINE (VITAMIN B-6) 100 MG tablet Take 100 mg by mouth daily.     Yes Historical Provider, MD  Testosterone (ANDROGEL PUMP TD) Place 2 application onto the skin daily. 2 pumps once daily    Yes Historical Provider, MD    Scheduled Meds:    . antiseptic oral rinse  15 mL Mouth Rinse q12n4p  . chlorhexidine  15 mL Mouth Rinse BID  . enalaprilat  1.25 mg Intravenous Q6H  . fentaNYL  100 mcg Intravenous Once  .  HYDROmorphone (DILAUDID) injection  1 mg Intravenous Once  .  HYDROmorphone (DILAUDID) injection  1 mg Intravenous STAT  . metoCLOPramide (REGLAN) injection  10 mg Intravenous Q6H  . ondansetron  4 mg Intravenous Once  . pantoprazole (PROTONIX) IV  40 mg Intravenous Q24H  . DISCONTD: iohexol  20 mL Oral Q1 Hr x 2   Infusions:    . sodium chloride 100 mL/hr at 06/03/11 0729   PRN Meds: HYDROmorphone (DILAUDID) injection, iohexol, phenol   Allergies as of 06/02/2011  . (No Known Allergies)    Family History  Problem Relation Age of Onset  . Coronary artery disease    . Heart failure Mother   . Emphysema Father   . Heart disease Brother   . Heart disease Brother   . Colon  cancer Brother     History   Social History  . Marital Status: Married    Spouse Name: N/A    Number of Children: 3  . Years of Education: N/A   Occupational History  . CEO-retired    Social History Main Topics  . Smoking status: Never Smoker   . Smokeless tobacco: Never Used  . Alcohol Use: Yes     occ.  . Drug Use: No  .  Sexually Active: Not on file   Other Topics Concern  . Not on file   Social History Narrative  . No narrative on file    REVIEW OF SYSTEMS: Constitutional:  Weight stable for several years.  Active: [employer], works with Product/process development scientist.  Still works at office part-time. ENT:  No nose bleeds.  NGT causing sinus pressure Pulm:  No SOB or cough.  CV:  No chest pain or palpitations.  No extremity edema GU:  No bloody urine, no frequency GI:  As above Heme:  No hx of requiring PO Iron . On daily po B12 at home.  Transfusions:  none Neuro:  No headache.  No confusion.  No unilateral weakness. Derm:  No rash or itching nor non-healing sores. Endocrine:  No hx elevated blood sugars Immunization:  Flu shot up to date  PHYSICAL EXAM: Vital signs in last 24 hours: Temp:  [97.5 F (36.4 C)-98 F (36.7 C)] 97.5 F (36.4 C) (02/25 0617) Pulse Rate:  [101-112] 105  (02/25 0617) Resp:  [18-24] 20  (02/25 0617) BP: (140-166)/(72-93) 142/72 mmHg (02/25 0617) SpO2:  [95 %-98 %] 98 % (02/25 0617) Weight:  [171 lb 15.3 oz (78 kg)] 171 lb 15.3 oz (78 kg) (02/24 2200)  General: Looks well.  NAD Head:  No signs of trauma  Eyes:  No pallor of conjunctiva, EOMI Ears:  No hearing aid in place  Nose:  No discharge or bleeding.  NGT in place, canister is empty. Mouth:  Teeth fair.  Moist and pink MM Neck:  No JVD or masses. Lungs:  Clear B Heart: RRR, no MRG Abdomen:  Distended but soft, slightly tender: non-focal.  No masses.  Hypoactive, tympanitic sounds.  No bruits. Agree - Gatha Mayer, MD, [facility] Rectal: deferred   Musc/Skeltl: no joint  deformities. Extremities:  No pedal edema  Neurologic:  No tremor, no weakness,  Oriented x 3. Skin:  Excessive sun exposure. Tattoos:  none Nodes:  none enlarged at groin or neck  Psych:  Pleasant,  Not anxious.   Intake/Output from previous day: 02/24 0701 - 02/25 0700 In: 800 [I.V.:800] Out: 1500  Intake/Output this shift:    LAB RESULTS:  Basename 06/03/11 0510 06/02/11 1535  WBC 8.0 8.8  HGB 14.7 16.7  HCT 43.4 47.4  PLT 194 198   BMET Lab Results  Component Value Date   NA 139 06/03/2011   NA 137 06/02/2011   NA 139 12/14/2010   K 4.1 06/03/2011   K 4.0 06/02/2011   K 4.2 12/14/2010   CL 101 06/03/2011   CL 102 06/02/2011   CL 103 12/14/2010   CO2 28 06/03/2011   CO2 24 06/02/2011   CO2 27 12/14/2010   GLUCOSE 122* 06/03/2011   GLUCOSE 150* 06/02/2011   GLUCOSE 89 12/14/2010   BUN 18 06/03/2011   BUN 18 06/02/2011   BUN 17 12/14/2010   CREATININE 1.14 06/03/2011   CREATININE 0.99 06/02/2011   CREATININE 1.1 12/14/2010   CALCIUM 9.1 06/03/2011   CALCIUM 9.8 06/02/2011   CALCIUM 9.1 12/14/2010   LFT  Basename 06/03/11 0510 06/02/11 1535  PROT 6.2 7.6  ALBUMIN 3.2* 3.8  AST 18 23  ALT 23 31  ALKPHOS 54 68  BILITOT 2.2* 2.7*  BILIDIR -- --  IBILI -- --   PT/INR Lab Results  Component Value Date   INR 1.0 12/14/2010   RADIOLOGY STUDIES: images reviewed - Gatha Mayer, MD, [facility]  Ct Abdomen Pelvis W Contrast  06/02/2011  *RADIOLOGY REPORT*  Clinical Data: Nausea and vomiting.  Diarrhea.  CT ABDOMEN AND PELVIS WITH CONTRAST  Technique:  Multidetector CT imaging of the abdomen and pelvis was performed following the standard protocol during bolus administration of intravenous contrast.  Contrast: 187m OMNIPAQUE IOHEXOL 300 MG/ML IV SOLN  Comparison: Multiple exams, including 10/24/2009 and 08/31/2006  Findings: Dependent linear subsegmental atelectasis is present in both lower lobes.  Orally administered contrast medium is in the distal esophagus, probably reflecting  gastroesophageal reflux.  The stomach appears distended.  Diffuse fatty infiltration of the liver noted.  The gallbladder is absent.  The spleen, pancreas, and adrenal glands appear normal.  The kidneys appear unremarkable, as do the proximal ureters.  No pathologic retroperitoneal or porta hepatis adenopathy is identified.  No pathologic pelvic adenopathy is identified.  Sigmoid diverticulosis is present without active diverticulitis.  Trace pelvic ascites noted.  Small hydroceles noted tracking in the small bilateral inguinal hernias.  Dilated small bowel up to 4.7 cm noted, with several adjacent transition points in the right upper pelvis representing the transition to nondilated small bowel. There is a short intermediate Reese segment containing a frothy stool like contents.  The appearance favors partial small bowel obstruction.  Low-level edema is present in the small bowel mesentery.  Extensive lumbar hardware fixation and posterior decompression noted.  IMPRESSION:  1.  Dilated stomach and proximal small bowel, most compatible with partial small-bowel obstruction.  Transition points are in the right upper pelvis. 2.  Diffuse hepatic steatosis. 3.  Gastroesophageal reflux. 4.  Sigmoid diverticulosis. 5.  Trace pelvic ascites.  Original Report Authenticated By: WCarron Curie M.D.   ESOPHOGRAM / BARIUM SWALLOW / BARIUM TABLET STUDY 05/24/11 IMPRESSION:  1. Today's study was generally normal in appearance, with  exception of retention of the barium tablet immediately above the  gastroesophageal junction, as above. This would suggest some very  mild narrowing in this region without definite stricture,  significant ring or obstructing mass.   DEtheleen Mayhew M.D.    ENDOSCOPIC STUDIES:  EGD    January 1999     [place]ophag[place]odenitis.   Colonoscopy   January 2009    SLB Mild changes pf Crohns at ileocolonic anastomosis and at 10 cm of adjacent small bowel.  Anastomosis  mildly stenotic.    IMPRESSION: 1.  PSBO.  Question secondary to active Crohns, known stricture at anastomosis or adhesions due to his prior surgery?  Improving with the NGT to LIS.  Up until recent events, Crohns dz has been stable. 2.  CG emesis.  3.  S/P ileocolonic anastomosis and ileocolectomy in 1970s.    4.  Dysphagia, abnormal esophagram 2/15.  Has EGD planned for March, after Plavix has been held.  5.  CAD  STEMI 2009.  BMS placed to LAD.  EF 60% by cath in 12/2010.    PLAN: 1.  Leave NGT in place.  ? Begin SolumedroL?   LOS: 1 day   SAzucena Freed 06/03/2011, 12:29 PM Pager: [phone]  Attending:  I have seen and examined the patient - notations above. Otherwise agree.  The patient has a partial SBO - ? Adhesions vs. Crohn's as causes or both. He is improving with NG suction but not ready to remove NGTube. Multiple transitions in small bowel suggest Crohn's as cause. He was also on cholestyramine which helped diarrhea but could have contributed.  Plan to start solumedrol 40 mg IV daily. Will follow-up tomorrow.  COfilia Neas  Carlean Purl, MD, [facility] [phone] (pager) 06/03/2011 4:58 PM

## 2011-06-04 ENCOUNTER — Inpatient Hospital Stay (HOSPITAL_COMMUNITY): Payer: Medicare Other

## 2011-06-04 DIAGNOSIS — K56609 Unspecified intestinal obstruction, unspecified as to partial versus complete obstruction: Secondary | ICD-10-CM | POA: Diagnosis present

## 2011-06-04 LAB — BASIC METABOLIC PANEL
BUN: 18 mg/dL (ref 6–23)
CO2: 25 mEq/L (ref 19–32)
Calcium: 8.6 mg/dL (ref 8.4–10.5)
Chloride: 104 mEq/L (ref 96–112)
Creatinine, Ser: 1.03 mg/dL (ref 0.50–1.35)
GFR calc Af Amer: 77 mL/min — ABNORMAL LOW (ref >90)
GFR calc non Af Amer: 66 mL/min — ABNORMAL LOW (ref >90)
Glucose, Bld: 114 mg/dL — ABNORMAL HIGH (ref 70–99)
Potassium: 4 mEq/L (ref 3.5–5.1)
Sodium: 139 mEq/L (ref 135–145)

## 2011-06-04 LAB — CBC
HCT: 40.7 % (ref 39.0–52.0)
Hemoglobin: 13.4 g/dL (ref 13.0–17.0)
MCH: 30.7 pg (ref 26.0–34.0)
MCHC: 32.9 g/dL (ref 30.0–36.0)
MCV: 93.1 fL (ref 78.0–100.0)
Platelets: 184 10*3/uL (ref 150–400)
RBC: 4.37 MIL/uL (ref 4.22–5.81)
RDW: 13.9 % (ref 11.5–15.5)
WBC: 5.5 10*3/uL (ref 4.0–10.5)

## 2011-06-04 MED ORDER — PANTOPRAZOLE SODIUM 40 MG IV SOLR
40.0000 mg | Freq: Two times a day (BID) | INTRAVENOUS | Status: DC
  Administered 2011-06-04 (×2): 40 mg via INTRAVENOUS
  Filled 2011-06-04 (×4): qty 40

## 2011-06-04 MED ORDER — WHITE PETROLATUM GEL
Status: AC
  Filled 2011-06-04: qty 5

## 2011-06-04 NOTE — Consult Note (Signed)
I saw and signed this yesterday.

## 2011-06-04 NOTE — Progress Notes (Addendum)
[name] [name] Daily Rounding Note 06/04/2011, 10:10 AM  SUBJECTIVE:       Passing flatus, formed, black stool this AM.  NGT output 300 cc last 24 hours, thru 7 AM today.  NGT output no longer dark.  Feels less distended.  Has received 2 doses of Solumedrol 40 mg so far.  OBJECTIVE:        General:  Looks well, slightly frail.    Vital signs in last 24 hours:    Temp:  [97.7 F (36.5 C)-99.6 F (37.6 C)] 97.7 F (36.5 C) (02/26 0523) Pulse Rate:  [94-99] 94  (02/26 0523) Resp:  [18-20] 18  (02/26 0523) BP: (122-126)/(59-69) 126/69 mmHg (02/26 0523) SpO2:  [93 %-96 %] 94 % (02/26 0523) Last BM Date: 06/02/11  Heart: RRR Chest: Clear B.  No cough, no dyspnea Abdomen:  Soft, less distended, NT.  BS still tympanitic.  NGT clamped, suctionate in canister is clear but pink tinged Extremities: no pedal edema.   Rectal:  Black, 3 plus FOB positive stool.  No mass. Neuro/Psych:  Moves without problem from chair to bed and back. Oriented x 3.    Intake/Output from previous day: 02/25 0701 - 02/26 0700 In: 2380.7 [I.V.:2380.7] Out: 825 [Urine:525; Emesis/NG output:300]  Intake/Output this shift:    Lab Results:  Basename 06/04/11 0625 06/03/11 0510 06/02/11 1535  WBC 5.5 8.0 8.8  HGB 13.4 14.7 16.7  HCT 40.7 43.4 47.4  PLT 184 194 198   BMET  Basename 06/04/11 0625 06/03/11 0510 06/02/11 1535  NA 139 139 137  K 4.0 4.1 4.0  CL 104 101 102  CO2 25 28 24   GLUCOSE 114* 122* 150*  BUN 18 18 18   CREATININE 1.03 1.14 0.99  CALCIUM 8.6 9.1 9.8   LFT  Basename 06/03/11 0510 06/02/11 1535  PROT 6.2 7.6  ALBUMIN 3.2* 3.8  AST 18 23  ALT 23 31  ALKPHOS 54 68  BILITOT 2.2* 2.7*  BILIDIR -- --  IBILI -- --   PT/INR No results found for this basename: LABPROT:2,INR:2 in the last 72 hours Hepatitis Panel No results found for this basename: HEPBSAG,HCVAB,HEPAIGM,HEPBIGM in the last 72 hours  Studies/Results: Dg Abd 1 View  06/04/2011  *RADIOLOGY REPORT*  Clinical Data:  Left lower quadrant pain, follow-up of partial small bowel obstruction  ABDOMEN - 1 VIEW  Comparison: CT abdomen pelvis of 06/02/2011  Findings: On this supine film of the abdomen there are persistently dilated loops of small bowel measuring up to 5.2 cm in diameter consistent with partial small bowel obstruction.  Some bowel gas and contrast is noted throughout the nondistended colon.  Hardware for posterior fusion from L3-S1 is noted.  IMPRESSION: Persistent partial small bowel obstruction.  Original Report Authenticated By: Joretta Bachelor, M.D.    ASSESMENT: 1. PSBO. Question secondary to active Crohns, known stricture at anastomosis or adhesions due to his prior surgery? Improving with the NGT to LIS. Up until recent events, Crohns dz has been stable. Has improved clinically in last 24 hours:  Less NGT ouput, passing flatus and stool.  Xray however shows persistent PSBO. 2. CG emesis/NGT output: resolved. The dark output began at home.  Stool is black and FOB positive by my exam this AM.  Pt report using 400 mg Ibuprofen about 4 x weekly.   3. S/P ileocolonic anastomosis and ileocolectomy in 1970s.  4. Dysphagia, abnormal esophagram 2/15. Has EGD planned for March, after Plavix has been held.  5.  CAD STEMI 2009. BMS placed to LAD. EF 60% by cath in 12/2010.    PLAN: 1.  NGT clamped per surgery orders.  2.  D/Cing the IV Reglan, as contraindicated in obstruction.  3.  Do we need to pursue EGD, diagnostic study only, now, while on Plavix, to eval the [name] bleed?  Or do we instruct pt to stop the Ibuprofen (I already did this today) and continue on PPI but temporarily increase to BID? And continue with plans for EGD/esoph dilt off Plavix as set for 06/12/2011? 4.  Should we pursue a SBFT in future (as outpt?) to define SB anatomy? 5.  I upped Protonix to 40 mg IV BID.  Ordered CBC for the AM.   LOS: 2 days   [name]  06/04/2011, 10:10 AM Pager: [phone]   Attending:  I have seen and examined  the patient and updated his daughter (husband is [employer]ologist, sport and exercise in [place]) about the situation.  1) partial SBO - improved - hard to say one dose of solumedrol did this. Will continue for now and probably short taper as outpatient. 2) Coffee ground emesis and heme+ dark stools - I suspect from SBO and not so much bleeding. Hgb has been normal. No work-up now - Crohn's, NGT could contribute to heme + stool also 3) Dysphagia and possible esophageal stricture - he will most likely need to delay the egd and dilation planned for early March. We will need to let Dr. Ardis Hughs know - I will send him a message 4) May need an outpatient colonoscopy - do not plan for SBFT 5) will need to stay off cholestyramine for now and at least short term future - could have had a role in obstruction Gatha Mayer, MD, [facility] [phone] (pager) 06/04/2011 5:42 PM

## 2011-06-04 NOTE — Progress Notes (Signed)
Subjective: Admitted for PSBO.   Gen Surgery and GI following along.  NGT in place and Ab less distended.  Material more beige and less black.  Passing Flatus. Feeling better. No fever.  No current pain.  No CP or SOB   Objective: Vital signs in last 24 hours: Temp:  [97.7 F (36.5 C)-99.6 F (37.6 C)] 97.7 F (36.5 C) (02/26 0523) Pulse Rate:  [94-99] 94  (02/26 0523) Resp:  [18-20] 18  (02/26 0523) BP: (122-126)/(59-69) 126/69 mmHg (02/26 0523) SpO2:  [93 %-96 %] 94 % (02/26 0523) Weight change:  Last BM Date: 06/02/11  CBG (last 3)  No results found for this basename: GLUCAP:3 in the last 72 hours  Intake/Output from previous day:  Intake/Output Summary (Last 24 hours) at 06/04/11 2979 Last data filed at 06/04/11 8921  Gross per 24 hour  Intake 2380.67 ml  Output    825 ml  Net 1555.67 ml   02/25 0701 - 02/26 0700 In: 2380.7 [I.V.:2380.7] Out: 825 [Urine:525; Emesis/NG output:300]   Physical Exam General appearance: A and o Eyes: no scleral icterus Throat: oropharynx moist without erythema Resp: CTA Cardio: Reg GI: softer but distended.  NGT in pace. Extremities: no clubbing, cyanosis or edema   Lab Results:  [facility] 06/04/11 0625 06/03/11 0510  NA 139 139  K 4.0 4.1  CL 104 101  CO2 25 28  GLUCOSE 114* 122*  BUN 18 18  CREATININE 1.03 1.14  CALCIUM 8.6 9.1  MG -- 1.6  PHOS -- --     Basename 06/03/11 0510 06/02/11 1535  AST 18 23  ALT 23 31  ALKPHOS 54 68  BILITOT 2.2* 2.7*  PROT 6.2 7.6  ALBUMIN 3.2* 3.8     Basename 06/04/11 0625 06/03/11 0510 06/02/11 1535  WBC 5.5 8.0 --  NEUTROABS -- 4.7 6.5  HGB 13.4 14.7 --  HCT 40.7 43.4 --  MCV 93.1 91.9 --  PLT 184 194 --    Lab Results  Component Value Date   INR 1.0 12/14/2010    No results found for this basename: CKTOTAL:3,CKMB:3,CKMBINDEX:3,TROPONINI:3 in the last 72 hours  No results found for this basename: TSH,T4TOTAL,FREET3,T3FREE,THYROIDAB in the last 72 hours  No results  found for this basename: VITAMINB12:2,FOLATE:2,FERRITIN:2,TIBC:2,IRON:2,RETICCTPCT:2 in the last 72 hours  Micro Results: No results found for this or any previous visit (from the past 240 hour(s)).   Studies/Results: Dg Abd 1 View  06/04/2011  *RADIOLOGY REPORT*  Clinical Data: Left lower quadrant pain, follow-up of partial small bowel obstruction  ABDOMEN - 1 VIEW  Comparison: CT abdomen pelvis of 06/02/2011  Findings: On this supine film of the abdomen there are persistently dilated loops of small bowel measuring up to 5.2 cm in diameter consistent with partial small bowel obstruction.  Some bowel gas and contrast is noted throughout the nondistended colon.  Hardware for posterior fusion from L3-S1 is noted.  IMPRESSION: Persistent partial small bowel obstruction.  Original Report Authenticated By: Joretta Bachelor, M.D.   Ct Abdomen Pelvis W Contrast  06/02/2011  *RADIOLOGY REPORT*  Clinical Data: Nausea and vomiting.  Diarrhea.  CT ABDOMEN AND PELVIS WITH CONTRAST  Technique:  Multidetector CT imaging of the abdomen and pelvis was performed following the standard protocol during bolus administration of intravenous contrast.  Contrast: 141m OMNIPAQUE IOHEXOL 300 MG/ML IV SOLN  Comparison: Multiple exams, including 10/24/2009 and 08/31/2006  Findings: Dependent linear subsegmental atelectasis is present in both lower lobes.  Orally administered contrast medium is in the  distal esophagus, probably reflecting gastroesophageal reflux.  The stomach appears distended.  Diffuse fatty infiltration of the liver noted.  The gallbladder is absent.  The spleen, pancreas, and adrenal glands appear normal.  The kidneys appear unremarkable, as do the proximal ureters.  No pathologic retroperitoneal or porta hepatis adenopathy is identified.  No pathologic pelvic adenopathy is identified.  Sigmoid diverticulosis is present without active diverticulitis.  Trace pelvic ascites noted.  Small hydroceles noted tracking in  the small bilateral inguinal hernias.  Dilated small bowel up to 4.7 cm noted, with several adjacent transition points in the right upper pelvis representing the transition to nondilated small bowel. There is a short intermediate Reese segment containing a frothy stool like contents.  The appearance favors partial small bowel obstruction.  Low-level edema is present in the small bowel mesentery.  Extensive lumbar hardware fixation and posterior decompression noted.  IMPRESSION:  1.  Dilated stomach and proximal small bowel, most compatible with partial small-bowel obstruction.  Transition points are in the right upper pelvis. 2.  Diffuse hepatic steatosis. 3.  Gastroesophageal reflux. 4.  Sigmoid diverticulosis. 5.  Trace pelvic ascites.  Original Report Authenticated By: Carron Curie, M.D.     Medications: Scheduled:    . antiseptic oral rinse  15 mL Mouth Rinse q12n4p  . chlorhexidine  15 mL Mouth Rinse BID  . enalaprilat  1.25 mg Intravenous Q6H  . methylPREDNISolone (SOLU-MEDROL) injection  40 mg Intravenous Daily  . metoCLOPramide (REGLAN) injection  10 mg Intravenous Q6H  . pantoprazole (PROTONIX) IV  40 mg Intravenous Q24H  . DISCONTD: predniSONE  50 mg Oral Q breakfast   Continuous:    . sodium chloride 100 mL/hr at 06/04/11 0202     Assessment/Plan: Principal Problem:  *SBO (small bowel obstruction) Active Problems:  HYPERTENSION  GERD  CROHN'S DISEASE Problem #1 PSBO with Ab Pain/AB Distension/N/V- Coffee ground- Undergoing conservative management with NPO and NGT.  On Steroids per GI. Pain under control.  Surgery on board.  Already improving some with time.  May be more Adhesions or small hernia related rather than Crohn's flare?.  Problem # 2:  Dehydration - IVF.  Better  Problem # 3:  CORONARY ARTERY DISEASE (ICD-414.00) With H/O MI CAD with recent (-) Cath. H/O  ATN/ARF @ 2006 with Crohns exac and MI Per Dr Haroldine Laws.  Doing well no Angina. Tolerating Coreg   3.125 BID.   Problem # 4:  ESSENTIAL HYPERTENSION (ICD-401.9) OutPt meds include:     Lisinopril 10 Mg Tabs (Lisinopril) .Marland Kitchen... Take one tablet daily    Carvedilol 3.125 Mg Tabs (Carvedilol) ..... One po bid BP fine on IV vasotec prn.  Problem # 5:  CROHN'S DISEASE (ICD-555.9)  Crohn's--Dr Jacob's Cholestyramine and Mesalamine as OutPt.  Solumedrol now. Saw Dr Ardis Hughs 05/2010.  Problem # 6:  GERD (ICD-530.81) PPI. Off the reglan.  7. DVT Prophylaxis - TED Hose    LOS: 2 days   Jerrilynn Mikowski M 06/04/2011, 8:12 AM

## 2011-06-04 NOTE — Progress Notes (Signed)
[facility] ([facility]) - ATTENDING: Patient feels better.  On steroids per GI consult.  Has had flatus and BM.  NG clamped without nausea or emesis.  Will remove NG tube and allow clear liquid diet. Earnstine Regal, MD, [facility]

## 2011-06-04 NOTE — Progress Notes (Signed)
Subjective: Feeling much better. Passing flatus and just had a BM this am. NG output down. Denies much pain and feels less bloated.  Objective: Vital signs in last 24 hours: Temp:  [97.7 F (36.5 C)-99.6 F (37.6 C)] 97.7 F (36.5 C) (02/26 0523) Pulse Rate:  [94-99] 94  (02/26 0523) Resp:  [18-20] 18  (02/26 0523) BP: (122-126)/(59-69) 126/69 mmHg (02/26 0523) SpO2:  [93 %-96 %] 94 % (02/26 0523) Last BM Date: 06/02/11  Intake/Output this shift:    Physical Exam: BP 126/69  Pulse 94  Temp(Src) 97.7 F (36.5 C) (Oral)  Resp 18  Ht 5' 10"  (1.778 m)  Wt 78 kg (171 lb 15.3 oz)  BMI 24.67 kg/m2  SpO2 94% Abdomen: softer, less distended. Few BS NT  Labs: CBC  Basename 06/04/11 0625 06/03/11 0510  WBC 5.5 8.0  HGB 13.4 14.7  HCT 40.7 43.4  PLT 184 194   BMET  Basename 06/04/11 0625 06/03/11 0510  NA 139 139  K 4.0 4.1  CL 104 101  CO2 25 28  GLUCOSE 114* 122*  BUN 18 18  CREATININE 1.03 1.14  CALCIUM 8.6 9.1   LFT  Basename 06/03/11 0510 06/02/11 1535  PROT 6.2 --  ALBUMIN 3.2* --  AST 18 --  ALT 23 --  ALKPHOS 54 --  BILITOT 2.2* --  BILIDIR -- --  IBILI -- --  LIPASE -- 23   PT/INR No results found for this basename: LABPROT:2,INR:2 in the last 72 hours ABG No results found for this basename: PHART:2,PCO2:2,PO2:2,HCO3:2 in the last 72 hours  Studies/Results: Dg Abd 1 View  06/04/2011  *RADIOLOGY REPORT*  Clinical Data: Left lower quadrant pain, follow-up of partial small bowel obstruction  ABDOMEN - 1 VIEW  Comparison: CT abdomen pelvis of 06/02/2011  Findings: On this supine film of the abdomen there are persistently dilated loops of small bowel measuring up to 5.2 cm in diameter consistent with partial small bowel obstruction.  Some bowel gas and contrast is noted throughout the nondistended colon.  Hardware for posterior fusion from L3-S1 is noted.  IMPRESSION: Persistent partial small bowel obstruction.  Original Report Authenticated By:  Joretta Bachelor, M.D.   Ct Abdomen Pelvis W Contrast  06/02/2011  *RADIOLOGY REPORT*  Clinical Data: Nausea and vomiting.  Diarrhea.  CT ABDOMEN AND PELVIS WITH CONTRAST  Technique:  Multidetector CT imaging of the abdomen and pelvis was performed following the standard protocol during bolus administration of intravenous contrast.  Contrast: 132m OMNIPAQUE IOHEXOL 300 MG/ML IV SOLN  Comparison: Multiple exams, including 10/24/2009 and 08/31/2006  Findings: Dependent linear subsegmental atelectasis is present in both lower lobes.  Orally administered contrast medium is in the distal esophagus, probably reflecting gastroesophageal reflux.  The stomach appears distended.  Diffuse fatty infiltration of the liver noted.  The gallbladder is absent.  The spleen, pancreas, and adrenal glands appear normal.  The kidneys appear unremarkable, as do the proximal ureters.  No pathologic retroperitoneal or porta hepatis adenopathy is identified.  No pathologic pelvic adenopathy is identified.  Sigmoid diverticulosis is present without active diverticulitis.  Trace pelvic ascites noted.  Small hydroceles noted tracking in the small bilateral inguinal hernias.  Dilated small bowel up to 4.7 cm noted, with several adjacent transition points in the right upper pelvis representing the transition to nondilated small bowel. There is a short intermediate Reese segment containing a frothy stool like contents.  The appearance favors partial small bowel obstruction.  Low-level edema is present in the  small bowel mesentery.  Extensive lumbar hardware fixation and posterior decompression noted.  IMPRESSION:  1.  Dilated stomach and proximal small bowel, most compatible with partial small-bowel obstruction.  Transition points are in the right upper pelvis. 2.  Diffuse hepatic steatosis. 3.  Gastroesophageal reflux. 4.  Sigmoid diverticulosis. 5.  Trace pelvic ascites.  Original Report Authenticated By: Carron Curie, M.D.     Assessment: Principal Problem:  *SBO (small bowel obstruction) Active Problems:  HYPERTENSION  GERD  CROHN'S DISEASE   X-rays still show dilated loops SB but contrast progressed to distal colon.  Plan: Clamp NG OOB  LOS: 2 days    Kandis Fantasia 06/04/2011 9:19 AM

## 2011-06-05 ENCOUNTER — Telehealth: Payer: Self-pay

## 2011-06-05 LAB — CBC
HCT: 39.8 % (ref 39.0–52.0)
Hemoglobin: 12.8 g/dL — ABNORMAL LOW (ref 13.0–17.0)
MCH: 29.8 pg (ref 26.0–34.0)
MCHC: 32.2 g/dL (ref 30.0–36.0)
MCV: 92.6 fL (ref 78.0–100.0)
Platelets: 205 10*3/uL (ref 150–400)
RBC: 4.3 MIL/uL (ref 4.22–5.81)
RDW: 14 % (ref 11.5–15.5)
WBC: 6.1 10*3/uL (ref 4.0–10.5)

## 2011-06-05 MED ORDER — CARVEDILOL 3.125 MG PO TABS
3.1250 mg | ORAL_TABLET | Freq: Two times a day (BID) | ORAL | Status: DC
  Administered 2011-06-05 – 2011-06-06 (×3): 3.125 mg via ORAL
  Filled 2011-06-05 (×6): qty 1

## 2011-06-05 MED ORDER — LISINOPRIL 10 MG PO TABS
10.0000 mg | ORAL_TABLET | Freq: Every day | ORAL | Status: DC
  Administered 2011-06-05 – 2011-06-06 (×2): 10 mg via ORAL
  Filled 2011-06-05 (×2): qty 1

## 2011-06-05 MED ORDER — PANTOPRAZOLE SODIUM 40 MG PO TBEC
40.0000 mg | DELAYED_RELEASE_TABLET | Freq: Two times a day (BID) | ORAL | Status: DC
  Administered 2011-06-05 – 2011-06-06 (×3): 40 mg via ORAL
  Filled 2011-06-05 (×3): qty 1

## 2011-06-05 MED ORDER — PREDNISONE 20 MG PO TABS
30.0000 mg | ORAL_TABLET | Freq: Every day | ORAL | Status: DC
  Administered 2011-06-06: 30 mg via ORAL
  Filled 2011-06-05 (×2): qty 1

## 2011-06-05 MED ORDER — ISOSORBIDE MONONITRATE ER 30 MG PO TB24
30.0000 mg | ORAL_TABLET | Freq: Every day | ORAL | Status: DC
  Administered 2011-06-05 – 2011-06-06 (×2): 30 mg via ORAL
  Filled 2011-06-05 (×2): qty 1

## 2011-06-05 NOTE — Progress Notes (Signed)
  Subjective: Pt ok. Feels good. Tol clears well. Cont to have BMs Still some bloating but not uncomfortable.  Objective: Vital signs in last 24 hours: Temp:  [97.7 F (36.5 C)-98.1 F (36.7 C)] 98.1 F (36.7 C) (02/27 0620) Pulse Rate:  [92-93] 92  (02/27 0620) Resp:  [18-20] 18  (02/27 0620) BP: (134-161)/(66-71) 134/66 mmHg (02/27 0620) SpO2:  [96 %-97 %] 97 % (02/27 0620) Last BM Date: 06/04/11  Intake/Output this shift:    Physical Exam: BP 134/66  Pulse 92  Temp(Src) 98.1 F (36.7 C) (Oral)  Resp 18  Ht 5' 10"  (1.778 m)  Wt 78 kg (171 lb 15.3 oz)  BMI 24.67 kg/m2  SpO2 97% Abdomen: soft, mild distention. Good BS, NT  Labs: CBC  Basename 06/05/11 0655 06/04/11 0625  WBC 6.1 5.5  HGB 12.8* 13.4  HCT 39.8 40.7  PLT 205 184   BMET  Basename 06/04/11 0625 06/03/11 0510  NA 139 139  K 4.0 4.1  CL 104 101  CO2 25 28  GLUCOSE 114* 122*  BUN 18 18  CREATININE 1.03 1.14  CALCIUM 8.6 9.1   LFT  Basename 06/03/11 0510 06/02/11 1535  PROT 6.2 --  ALBUMIN 3.2* --  AST 18 --  ALT 23 --  ALKPHOS 54 --  BILITOT 2.2* --  BILIDIR -- --  IBILI -- --  LIPASE -- 23   PT/INR No results found for this basename: LABPROT:2,INR:2 in the last 72 hours ABG No results found for this basename: PHART:2,PCO2:2,PO2:2,HCO3:2 in the last 72 hours  Studies/Results: Dg Abd 1 View  06/04/2011  *RADIOLOGY REPORT*  Clinical Data: Left lower quadrant pain, follow-up of partial small bowel obstruction  ABDOMEN - 1 VIEW  Comparison: CT abdomen pelvis of 06/02/2011  Findings: On this supine film of the abdomen there are persistently dilated loops of small bowel measuring up to 5.2 cm in diameter consistent with partial small bowel obstruction.  Some bowel gas and contrast is noted throughout the nondistended colon.  Hardware for posterior fusion from L3-S1 is noted.  IMPRESSION: Persistent partial small bowel obstruction.  Original Report Authenticated By: Joretta Bachelor, M.D.     Assessment: Principal Problem:  *SBO (small bowel obstruction) Active Problems:  HYPERTENSION  GERD  CROHN'S DISEASE     Plan: Making progress. Will advance to fulls today. Encouraged OOB  LOS: 3 days    Kandis Fantasia 06/05/2011 9:28 AM

## 2011-06-05 NOTE — Progress Notes (Signed)
Subjective: Admitted for PSBO.   Gen Surgery and GI following along.  NGT now out and Ab less distended/near baseline.    Passing Flatus and some diarrhea.  Tolerating clears and jello.  Feeling better. No fever.  No current pain.  No CP or SOB   Objective: Vital signs in last 24 hours: Temp:  [97.7 F (36.5 C)-98.1 F (36.7 C)] 98.1 F (36.7 C) (02/27 0620) Pulse Rate:  [92-93] 92  (02/27 0620) Resp:  [18-20] 18  (02/27 0620) BP: (134-161)/(66-71) 134/66 mmHg (02/27 0620) SpO2:  [96 %-97 %] 97 % (02/27 0620) Weight change:  Last BM Date: 06/04/11  CBG (last 3)  No results found for this basename: GLUCAP:3 in the last 72 hours  Intake/Output from previous day:  Intake/Output Summary (Last 24 hours) at 06/05/11 0733 Last data filed at 06/05/11 0600  Gross per 24 hour  Intake 2331.75 ml  Output      0 ml  Net 2331.75 ml   02/26 0701 - 02/27 0700 In: 2331.8 [P.O.:360; I.V.:1971.8] Out: -    Physical Exam General appearance: A and O Eyes: no scleral icterus Throat: oropharynx moist without erythema Resp: CTA Cardio: Reg GI: softer and less distended.   Extremities: no clubbing, cyanosis or edema   Lab Results:  [facility] 06/04/11 0625 06/03/11 0510  NA 139 139  K 4.0 4.1  CL 104 101  CO2 25 28  GLUCOSE 114* 122*  BUN 18 18  CREATININE 1.03 1.14  CALCIUM 8.6 9.1  MG -- 1.6  PHOS -- --     Basename 06/03/11 0510 06/02/11 1535  AST 18 23  ALT 23 31  ALKPHOS 54 68  BILITOT 2.2* 2.7*  PROT 6.2 7.6  ALBUMIN 3.2* 3.8     Basename 06/04/11 0625 06/03/11 0510 06/02/11 1535  WBC 5.5 8.0 --  NEUTROABS -- 4.7 6.5  HGB 13.4 14.7 --  HCT 40.7 43.4 --  MCV 93.1 91.9 --  PLT 184 194 --    Lab Results  Component Value Date   INR 1.0 12/14/2010    No results found for this basename: CKTOTAL:3,CKMB:3,CKMBINDEX:3,TROPONINI:3 in the last 72 hours  No results found for this basename: TSH,T4TOTAL,FREET3,T3FREE,THYROIDAB in the last 72 hours  No results found  for this basename: VITAMINB12:2,FOLATE:2,FERRITIN:2,TIBC:2,IRON:2,RETICCTPCT:2 in the last 72 hours  Micro Results: No results found for this or any previous visit (from the past 240 hour(s)).   Studies/Results: Dg Abd 1 View  06/04/2011  *RADIOLOGY REPORT*  Clinical Data: Left lower quadrant pain, follow-up of partial small bowel obstruction  ABDOMEN - 1 VIEW  Comparison: CT abdomen pelvis of 06/02/2011  Findings: On this supine film of the abdomen there are persistently dilated loops of small bowel measuring up to 5.2 cm in diameter consistent with partial small bowel obstruction.  Some bowel gas and contrast is noted throughout the nondistended colon.  Hardware for posterior fusion from L3-S1 is noted.  IMPRESSION: Persistent partial small bowel obstruction.  Original Report Authenticated By: Joretta Bachelor, M.D.     Medications: Scheduled:    . antiseptic oral rinse  15 mL Mouth Rinse q12n4p  . chlorhexidine  15 mL Mouth Rinse BID  . enalaprilat  1.25 mg Intravenous Q6H  . methylPREDNISolone (SOLU-MEDROL) injection  40 mg Intravenous Daily  . pantoprazole (PROTONIX) IV  40 mg Intravenous Q12H  . white petrolatum      . DISCONTD: metoCLOPramide (REGLAN) injection  10 mg Intravenous Q6H  . DISCONTD: pantoprazole (PROTONIX) IV  40 mg Intravenous Q24H   Continuous:    . sodium chloride 75 mL/hr at 06/05/11 0130     Assessment/Plan: Principal Problem:  *SBO (small bowel obstruction) Active Problems:  HYPERTENSION  GERD  CROHN'S DISEASE Problem #1 PSBO with Ab Pain/AB Distension/N/V- Coffee ground- Improved with conservative management.  NGT is out and now eating.  On Steroids per GI. Pain under control.  Surgery on board.    May be more Adhesions or small hernia related rather than Crohn's flare?. May need an outpatient colonoscopy - do not plan for SBFT.  May need EGD as well. will need to stay off cholestyramine for now and at least short term future - could have had a role in  obstruction. Advance activity and diet and work on progression for D/c by Friday?   Problem # 2:  Dehydration - Wean IVF.  Better  Problem # 3:  CORONARY ARTERY DISEASE (ICD-414.00) Doing well no Angina.  Problem # 4:  ESSENTIAL HYPERTENSION (ICD-401.9) Stable  Problem # 5:  CROHN'S DISEASE (ICD-555.9)  Crohn's--Dr Jacob's Solumedrol.  Problem # 6:  GERD (ICD-530.81) PPI. Marland Kitchen  7. DVT Prophylaxis - TED Hose    LOS: 3 days   Danetra Glock M 06/05/2011, 7:33 AM

## 2011-06-05 NOTE — Progress Notes (Signed)
[facility] ([facility] - ATTENDING: Patient without pain.  Tolerating full liquid diet.  No nausea or emesis.  Less distended.  No tenderness on exam.  Will follow. Earnstine Regal, MD, [facility] Office: [phone]

## 2011-06-05 NOTE — Telephone Encounter (Signed)
Pt appt's have been cx and ROV scheduled

## 2011-06-05 NOTE — Progress Notes (Signed)
     [facility] Daily Rounding Note 06/05/2011, 10:18 AM  SUBJECTIVE:       Passing stools and flatus.  No abdominal pain, just feels bloated.  3 black stools yesterday, 2 so far today, all are loose. Tolerating full liquids.  Stools "lighter in color now" Gatha Mayer, MD, [facility]  OBJECTIVE:        General: looks well, a bit weak  Vital signs in last 24 hours:    Temp:  [97.7 F (36.5 C)-98.1 F (36.7 C)] 98.1 F (36.7 C) (02/27 0620) Pulse Rate:  [92-93] 92  (02/27 0620) Resp:  [18-20] 18  (02/27 0620) BP: (134-161)/(66-71) 134/66 mmHg (02/27 0620) SpO2:  [96 %-97 %] 97 % (02/27 0620) Last BM Date: 06/04/11  Heart: RRR Chest: Clear B, no cough Abdomen: soft with mild distention.  BS+ nontender Extremities: no pedal edema Neuro/Psych:  Pleasant, relaxed, not confused  Intake/Output from previous day: 02/26 0701 - 02/27 0700 In: 2331.8 [P.O.:360; I.V.:1971.8] Out: -     Lab Results:  Basename 06/05/11 0655 06/04/11 0625 06/03/11 0510  WBC 6.1 5.5 8.0  HGB 12.8* 13.4 14.7  HCT 39.8 40.7 43.4  PLT 205 184 194   BMET  Basename 06/04/11 0625 06/03/11 0510 06/02/11 1535  NA 139 139 137  K 4.0 4.1 4.0  CL 104 101 102  CO2 25 28 24   GLUCOSE 114* 122* 150*  BUN 18 18 18   CREATININE 1.03 1.14 0.99  CALCIUM 8.6 9.1 9.8   LFT  Basename 06/03/11 0510 06/02/11 1535  PROT 6.2 7.6  ALBUMIN 3.2* 3.8  AST 18 23  ALT 23 31  ALKPHOS 54 68  BILITOT 2.2* 2.7*  BILIDIR -- --  IBILI -- --   Studies/Results: No imaging studies today.   ASSESMENT: 1.  Partial SBO, clinically much improved.  Crohn's disease vs. Adhesions or both. 2.  Heme +, cg emesis and dark stools.  So far no plans to scope. Hgb slightly down, he says stools are clearing (lighter) 3.  Dysphagia, abnormal UGI series.  EGD/dilatation off plavix will be rescheduled for a later date.  May need out pt colonoscopy as well, could be done at the same time.  4.  Anemia, just barely.  Hgb down less than 1  gram in 24 hours, down 2 grams in last 48 hours.   PLAN: 1.  Surgery has written for full liquids. 2.  Will convert to PO prednisone 30 mg daily, begin tomorrow as he got Solumedrol this AM. 3.   CBC in AM.  If stable:  ? Home tomorrow? - probably Friday if ok. Advance to low residue diet over time.   LOS: 3 days   [name]  06/05/2011, 10:18 AM Pager: [phone]   Attending - Gatha Mayer, MD, [facility] I have seen and examined the patient and additions made above.  He is improving re: pSBO and Crohn's. Do not think he is having a major GI bleed - will continue to observe. Some of the dark stools could be from the obstruction and his Crohn's/  His EGD/dili next week has been cancelled and he will see Dr, Ardis Hughs in the office after dc.  Gatha Mayer, MD, [facility] [phone] (pager) 06/05/2011 3:34 PM

## 2011-06-05 NOTE — Telephone Encounter (Signed)
Message copied by Barron Alvine on Wed Jun 05, 2011  7:48 AM ------      Message from: Owens Loffler P      Created: Wed Jun 05, 2011  7:10 AM      Regarding: RE: reschedule dili       His dysphagia was not very bad, he can definitely wait to recover more from the pSBO.  I'll have Kreston Ahrendt cancel the upcoming EGD and set him up with rov with me (3-4 weeks from now) to follow up from hosp and reconsider EGD if dysphagia still a problem            Thanks            Zaidyn Claire,      See the above, thanks                  ----- Message -----         From: Gatha Mayer, MD         Sent: 06/04/2011   5:45 PM           To: Owens Loffler, MD      Subject: reschedule dili                                          He is in with pSBO - I think may need to delay the EGD/dili planned for early March - he will be here a bit longer - could play it by ear but wanted your input            Please let me know

## 2011-06-06 LAB — CBC
HCT: 39.2 % (ref 39.0–52.0)
Hemoglobin: 13.3 g/dL (ref 13.0–17.0)
MCH: 30.9 pg (ref 26.0–34.0)
MCHC: 33.9 g/dL (ref 30.0–36.0)
MCV: 91.2 fL (ref 78.0–100.0)
Platelets: 218 10*3/uL (ref 150–400)
RBC: 4.3 MIL/uL (ref 4.22–5.81)
RDW: 14.1 % (ref 11.5–15.5)
WBC: 7 10*3/uL (ref 4.0–10.5)

## 2011-06-06 MED ORDER — BIOTENE DRY MOUTH MT LIQD: 15.0000 mL | Freq: Two times a day (BID) | OROMUCOSAL | Status: DC

## 2011-06-06 MED ORDER — PREDNISONE (PAK) 10 MG PO TABS: 10.0000 mg | ORAL_TABLET | Freq: Every day | ORAL | Status: AC

## 2011-06-06 NOTE — Progress Notes (Signed)
Agree with Ms. Sandi Carne assessment and plan. Gatha Mayer, MD, [facility]

## 2011-06-06 NOTE — Discharge Summary (Signed)
Physician Discharge Summary  DISCHARGE SUMMARY   [name] MR#: [id number] DOB/AGE: Dec 07, 1928 78 y.o.   Attending 78 M  Patient's BHA:LPFXT,KWIO M, MD, MD  Consults:Treatment Team:  Gatha Mayer, MD**  Admit date: 06/02/2011 Discharge date: 06/06/2011  Discharge Diagnoses:  Principal Problem:  *SBO (small bowel obstruction) Active Problems:  HYPERTENSION  GERD  CROHN'S DISEASE   Patient Active Problem List  Diagnoses  . HYPERLIPIDEMIA  . HYPERTENSION  . CAD  . HEMORRHOIDS  . EROSIVE ESOPHAGITIS  . GERD  . HIATAL HERNIA  . CROHN'S DISEASE-SMALL INTESTINE  . CROHN'S DISEASE  . ULCERATIVE ILEOCOLITIS  . SMALL BOWEL OBSTRUCTION  . DIVERTICULOSIS, COLON  . FATTY LIVER DISEASE  . ABNORMAL FINDINGS GI TRACT  . NONSPECIFIC ABNORMAL RESULTS LIVR FUNCTION STUDY  . NOSEBLEED  . Angina of effort  . Fatigue  . Tinea corporis  . Dysphagia  . SBO (small bowel obstruction)   Past Medical History  Diagnosis Date  . Hypertension   . Hyperlipidemia   . Coronary artery disease     s/p ANTERIOR STEMI 1/09 at [place]; treated with a bare-metal stent to the LAD; Myoview 11/11 EF 71%, no scar, no ischemia;     cath 12/20/10:   pLAD 30-40%, stent ok, ? If stent oversized, EF 60% and low LVEDP  . GERD (gastroesophageal reflux disease)   . Other esophagitis   . Diaphragmatic hernia without mention of obstruction or gangrene   . Ulcerative (chronic) ileocolitis   . Diverticulosis of colon (without mention of hemorrhage)   . Other chronic nonalcoholic liver disease   . Regional enteritis of small intestine   . Flatulence, eructation, and gas pain   . Ischemic cardiomyopathy     EF initially 35-40% after MI 1/09; echo 7/08: EF 60%  . Fatty liver     on ultrasound of 10/2009   PMH: Crohn's--Dr Jacob's, GERD, Hyperlipidemia, Nephrolithiasis, Osteoporosis, CAD, HTN, DDD, Melanoma, H/O GI Bleeding/hemorrhage, Shingles, Sq Cell CA--S/P Mohs--Dr  Link Snuffer 4/11, H/O ATN/ARF @ 2006 with Crohns exac and MI, Ventral Hernia, Hypogonadism  10/2009 [place] There is marked, diffuse fatty infiltration of the Liver   PSH: R Eye Catarct 12/2009, L cataract 06/2010  L3-S1 Fusion/8 Screws with Dr Ellene Route.  S/P Chole  Back operations/titanium rods  CAD MI 5/08, anterior MI 99% prox LAD PCI/stent x 2 Bare metal  S/P Bowel resection ascending colon & ileus 1978  S/P Moh's     Discharged Condition: Better   Discharge Medications: Medication List  As of 06/06/2011  7:53 AM   STOP taking these medications         cholestyramine 4 G packet      clopidogrel 75 MG tablet      metoCLOPramide 5 MG tablet      POTASSIUM CHLORIDE PO         TAKE these medications         ANDROGEL PUMP TD   Place 2 application onto the skin daily. 2 pumps once daily        antiseptic oral rinse Liqd   15 mLs by Mouth Rinse route 2 times daily at 12 noon and 4 pm.      aspirin EC 81 MG tablet   Take 81 mg by mouth daily.      atorvastatin 40 MG tablet   Commonly known as: LIPITOR   Take 40 mg by mouth daily.      carvedilol 3.125 MG tablet  Commonly known as: COREG   Take 3.125 mg by mouth 2 (two) times daily with a meal.      cholecalciferol 1000 UNITS tablet   Commonly known as: VITAMIN D   Take 2,000 Units by mouth daily.      cyanocobalamin 1000 MCG tablet   Take 100 mcg by mouth daily.      isosorbide mononitrate 30 MG 24 hr tablet   Commonly known as: IMDUR   Take 30 mg by mouth daily.      lisinopril 10 MG tablet   Commonly known as: PRINIVIL,ZESTRIL   Take 10 mg by mouth daily.      mesalamine 250 MG CR capsule   Commonly known as: PENTASA   Take 500 mg by mouth 3 (three) times daily.      nitroGLYCERIN 0.4 MG SL tablet   Commonly known as: NITROSTAT   Place 0.4 mg under the tongue every 5 (five) minutes as needed. For chest pain.      ondansetron 8 MG disintegrating tablet   Commonly known as: ZOFRAN-ODT   Take 4-8 mg by mouth  every 8 (eight) hours as needed. For nausea.      predniSONE 10 MG tablet   Commonly known as: STERAPRED UNI-PAK   Take 1 tablet (10 mg total) by mouth daily. 3 daily for 3 days, then 2 daily for 4 days, then 1 daily for 4 days, then 1/2 daily for 4 days.      PREVACID PO   Take 1 tablet by mouth daily.      pyridOXINE 100 MG tablet   Commonly known as: VITAMIN B-6   Take 100 mg by mouth daily.           Complete OutPatiet Medication List:  1) Lipitor 40 Mg Tabs (Atorvastatin calcium) .... Take one tablet daily  2) Lisinopril 10 Mg Tabs (Lisinopril) .... Take one tablet daily  3) Nitroglycerin 0.4 Mg Subl (Nitroglycerin) .... Use one under the tongue as needed  4) Pentasa 250 Mg Cr-caps (Mesalamine) .... Take two tabs three times a day  5) Prevacid 30 Mg Cpdr (Lansoprazole) .... Take one tablet daily  6) Plavix 75 Mg Tabs (Clopidogrel bisulfate) .... Take one tablet daily  7) Caltrate 600+d Tabs (Calcium carbonate-vitamin d tabs) .... Take one tablet by mouth twice daily  8) Aspirin 81 Mg Ec Tab (Aspirin) .... Take one (1) tablet by mouth daily  9) Carvedilol 3.125 Mg Tabs (Carvedilol) .... One po bid  10) Potassium Chloride 90 Mg .... One tab daily  11) Vitamin D3 Tabs (Cholecalciferol tabs) .... One tab daily  12) Multivitamins Tabs (Multiple vitamin) .... Take one tablet by mouth every day  13) Align Caps (Probiotic product) .... One po every day  14) Reglan 5 Mg Tabs (Metoclopramide hcl) .... For stomach when needed one tab every 8 hours  15) Cholestyramine Light 4 Gm/dose Powd (Cholestyramine light) .... One scoop daily  16) Isosorbide Mononitrate Cr 30 Mg Xr24h-tab (Isosorbide mononitrate) .... One capsule by mouth daily  17) Androgel Pump 1.25 Gm/act (1%) Gel (Testosterone) .... Two pumps once per day to shoulders/chest daily   Hospital Procedures: Dg Abd 1 View  06/04/2011  *RADIOLOGY REPORT*  Clinical Data: Left lower quadrant pain, follow-up of partial small bowel  obstruction  ABDOMEN - 1 VIEW  Comparison: CT abdomen pelvis of 06/02/2011  Findings: On this supine film of the abdomen there are persistently dilated loops of small bowel measuring up to 5.2 cm in  diameter consistent with partial small bowel obstruction.  Some bowel gas and contrast is noted throughout the nondistended colon.  Hardware for posterior fusion from L3-S1 is noted.  IMPRESSION: Persistent partial small bowel obstruction.  Original Report Authenticated By: Joretta Bachelor, M.D.   Ct Abdomen Pelvis W Contrast  06/02/2011  *RADIOLOGY REPORT*  Clinical Data: Nausea and vomiting.  Diarrhea.  CT ABDOMEN AND PELVIS WITH CONTRAST  Technique:  Multidetector CT imaging of the abdomen and pelvis was performed following the standard protocol during bolus administration of intravenous contrast.  Contrast: 143m OMNIPAQUE IOHEXOL 300 MG/ML IV SOLN  Comparison: Multiple exams, including 10/24/2009 and 08/31/2006  Findings: Dependent linear subsegmental atelectasis is present in both lower lobes.  Orally administered contrast medium is in the distal esophagus, probably reflecting gastroesophageal reflux.  The stomach appears distended.  Diffuse fatty infiltration of the liver noted.  The gallbladder is absent.  The spleen, pancreas, and adrenal glands appear normal.  The kidneys appear unremarkable, as do the proximal ureters.  No pathologic retroperitoneal or porta hepatis adenopathy is identified.  No pathologic pelvic adenopathy is identified.  Sigmoid diverticulosis is present without active diverticulitis.  Trace pelvic ascites noted.  Small hydroceles noted tracking in the small bilateral inguinal hernias.  Dilated small bowel up to 4.7 cm noted, with several adjacent transition points in the right upper pelvis representing the transition to nondilated small bowel. There is a short intermediate Reese segment containing a frothy stool like contents.  The appearance favors partial small bowel obstruction.   Low-level edema is present in the small bowel mesentery.  Extensive lumbar hardware fixation and posterior decompression noted.  IMPRESSION:  1.  Dilated stomach and proximal small bowel, most compatible with partial small-bowel obstruction.  Transition points are in the right upper pelvis. 2.  Diffuse hepatic steatosis. 3.  Gastroesophageal reflux. 4.  Sigmoid diverticulosis. 5.  Trace pelvic ascites.  Original Report Authenticated By: WCarron Curie M.D.   Dg Esophagus  05/27/2011  *RADIOLOGY REPORT*  Clinical Data: Dysphasia  ESOPHOGRAM / BARIUM SWALLOW / BARIUM TABLET STUDY  Technique:  Combined double contrast and single contrast examination performed using effervescent crystals, thick barium liquid, and thin barium liquid.  The patient was observed with fluoroscopy swallowing a 119mbarium sulphate tablet.  Fluoroscopy time:  3.31 minutes.  Comparison:  No priors.  Findings:  Esophageal mucosa is normal in appearance.  Multiple single swallow attempts were observed, and normal esophageal motility was noted.  No evidence of obstructing mass, tight stricture, significant esophageal ring or hiatal hernia is evident. However, when the patient swallowed the barium tablet (13 mm in diameter) it remained immediately above the gastroesophageal junction for several minutes, despite repeated consumption of water.  IMPRESSION: 1.  Today's study was generally normal in appearance, with exception of retention of the barium tablet immediately above the gastroesophageal junction, as above.  This would suggest some very mild narrowing in this region (without definite stricture, significant ring or obstructing mass.  Original Report Authenticated By: DAEtheleen MayhewM.D.    History of Present Illness: [age]ear old Caucasian male with history of Crohn's disease status post laparotomy secondary to perforation, multiple abdominal surgeries, coronary artery disease presented to the emergency room with 3 days  history of generalized abdominal pain with associated abdominal distention. This was said to be associated with multiple episode of nausea and vomiting. Vomiting was nonprojectile with coffee-ground appearance. Patient had multiple episode of diarrhea that was nonbloody. Patient denied any history of fever,  chills or Rigors.  In the ED, when patient was seen, he had abdominal distention and imaging studies done showed partial bowel obstruction. Patient had NG tube inserted with continuous low pressure suctioning. About 500 cc of coffee-ground fluid was suctioned from the stomach.  After evaluation of patient, he was admitted with general surgery and GI consulted. He was originally admitted by the Hospitalist service.   Hospital Course: I saw that Mr Gan popped up on my census one day after admission after being admitted for PSBO.  He had already been evaluated by Gen Surgery and GI consult was Pending. NGT was in place and Ab less distended. Feeling better but 2 buckets of Dark GI material had been suctioned out. No fever. No current pain.  I took him on my service.  Problem #1 PSBO with Ab Pain/AB Distension/N/V- Coffee ground- He underwent conservative management with NPO and NGT. Pain under control. Surgery on board. The discussion was is this Adhesions, known stricture at anastomosis due to prior surgery (S/P ileocolonic anastomosis and ileocolectomy in 1970s), or small hernia related versus Active Crohn's flare.   Patient improved with time.  Solumedrol was added and had ? Benefit.  The Solumedrol was converted to PO prednisone and will be tapered.  As he started to pass gas and have BMs, his diet was advanced and tolerated.  The NGT was removed.  He was co-managed by GI, Surgery and me. His Ab exam is markedly better despite imaging showing continued partial SBO.  Reglan was D/ced and cholestyramine will remain off.  Thoughts were entertained as to whether EGD was indicated due to the coffee  grounds. Since his Hbg stayed stable and no real signs of GIB - invasive testing was placed on Hold.  His dysphagia is not considered too bad and he can definitely wait to recover more from the pSBO prior to any invasive testing.  GI considering Outpatient EGD, SBFT, and other tests.  Seen on am rounds.  He is clinically improved.  No fever, No pains.  He had 2 BMs last night - Diarrhea.  No other issues.  Will advance diet and if doing well into this afternoon, we will discharge him home.  As far as Partial SBO, clinically much improved. Crohn's disease vs. Adhesions or both.  Heme +, cg emesis and dark stools. So far no plans to scope. Hgb slightly down but great at 12.8, stools are clearing (lighter)  Dysphagia, abnormal UGI series. EGD/dilatation off plavix will be rescheduled for a later date. May need out pt colonoscopy as well, could be done at the same time.  Will get PO prednisone 30 mg today and wean quickly. GI did not think he is having a major GI bleed - will continue to observe. Some of the dark stools could be from the obstruction and his Crohn's/  His EGD/dili next week has been cancelled and he will see Dr, Ardis Hughs in the office after dc.   Problem # 2: Dehydration - IVF  Problem #3. Dysphagia, abnormal esophagram 2/15. Has EGD planned for March, after Plavix has been held.  I will D/c him on ASA and off the Plavix.  Problem #4. CAD STEMI 2009. BMS placed to LAD. EF 60% by cath in 12/2010.   Doing well no Angina.   Problem #5: ESSENTIAL HYPERTENSION (ICD-401.9)  Stable   Problem # 6: GERD (ICD-530.81)  D/C on home PPI.   7. DVT Prophylaxis - TED Hose  Day of Discharge Exam BP 140/65  Pulse 79  Temp(Src) 97.8 F (36.6 C) (Oral)  Resp 18  Ht 5' 10"  (1.778 m)  Wt 78 kg (171 lb 15.3 oz)  BMI 24.67 kg/m2  SpO2 95%  Physical Exam: General appearance: A and O  Eyes: no scleral icterus  Throat: oropharynx moist without erythema  Resp: CTA  Cardio: Reg  GI: softer  and less distended. Near baseline.  Extremities: no clubbing, cyanosis or edema  Discharge Labs:  [facility] 06/04/11 0625  NA 139  K 4.0  CL 104  CO2 25  GLUCOSE 114*  BUN 18  CREATININE 1.03  CALCIUM 8.6  MG --  PHOS --   No results found for this basename: AST:2,ALT:2,ALKPHOS:2,BILITOT:2,PROT:2,ALBUMIN:2 in the last 72 hours  Basename 06/05/11 0655 06/04/11 0625  WBC 6.1 5.5  NEUTROABS -- --  HGB 12.8* 13.4  HCT 39.8 40.7  MCV 92.6 93.1  PLT 205 184   No results found for this basename: CKTOTAL:3,CKMB:3,CKMBINDEX:3,TROPONINI:3 in the last 72 hours No results found for this basename: TSH,T4TOTAL,FREET3,T3FREE,THYROIDAB in the last 72 hours No results found for this basename: VITAMINB12:2,FOLATE:2,FERRITIN:2,TIBC:2,IRON:2,RETICCTPCT:2 in the last 72 hours Lab Results  Component Value Date   INR 1.0 12/14/2010       Discharge instructions: Discharge Orders    Future Appointments: Provider: Department: Dept Phone: Center:   07/02/2011 2:15 PM Owens Loffler, MD [facility] [phone] [facility]     01-Home or Self Care Follow-up Information    Follow up with [name], MD in 2 weeks.      Follow up with Owens Loffler, MD in 2 weeks.   Contact information:   [address] [place] [phone]           Disposition: home  Follow-up Appts: Follow-up with Dr. Virgina Jock at [facility] in 2 weeks.  Call for appointment.  Condition on Discharge: stable  Tests Needing Follow-up: Per GI  Time spent in discharge (includes decision making & examination of pt): 40 min  Signed: Sayed Apostol M 06/06/2011, 7:53 AM

## 2011-06-06 NOTE — Discharge Instructions (Addendum)
Crohn's Disease Crohn's disease is a long-term (chronic) soreness and redness (inflammation) of the intestines (bowel). It can affect any portion of the digestive tract, from the mouth to the anus. It can also cause problems outside the digestive tract. Crohn's disease is closely related to a disease called ulcerative colitis (together, these two diseases are called inflammatory bowel disease).  CAUSES  The cause of Crohn's disease is not known. One Link Snuffer is that, in an easily affected (susceptible) person, the immune system is triggered to attack the body's own digestive tissue. Crohn's disease runs in families. It seems to be more common in certain geographic areas and amongst certain races. There are no clear-cut dietary causes.  SYMPTOMS  Crohn's disease can cause many different symptoms since it can affect many different parts of the body. Symptoms include:  Fatigue.   Weight loss.   Chronic diarrhea, sometime bloody.   Abdominal pain and cramps.   Fever.   Ulcers or canker sores in the mouth or rectum.   Anemia (low red blood cells).   Arthritis, skin problems, and eye problems may occur.  Complications of Crohn's disease can include:  Series of holes (perforation) of the bowel.   Portions of the intestines sticking to each other (adhesions).   Obstruction of the bowel.   Fistula formation, typically in the rectal area but also in other areas. A fistula is an opening between the bowels and the outside, or between the bowels and another organ.   A painful crack in the mucous membrane of the anus (rectal fissure).  DIAGNOSIS  Your caregiver may suspect Crohn's disease based on your symptoms and an exam. Blood tests may confirm that there is a problem. You may be asked to submit a stool specimen for examination. X-rays and CT scans may be necessary. Ultimately, the diagnosis is usually made after a procedure that uses a flexible tube that is inserted via your mouth or your anus.  This is done under sedation and is called either an upper endoscopy or colonoscopy. With these tests, the specialist can take tiny tissue samples and remove them from the inside of the bowel (biopsy). Examination of this biopsy tissue under a microscope can reveal Crohn's disease as the cause of your symptoms. Due to the many different forms that Crohn's disease can take, symptoms may be present for several years before a diagnosis is made. HOME CARE INSTRUCTIONS   There is no cure for Crohn's disease. The best treatment is frequent checkups with your caregiver.   Symptoms such as diarrhea can be controlled with medications. Avoid foods that have a laxative effect such as fresh fruit, vegetables and dairy products. During flare ups, you can rest your bowel by refraining from solid foods. Drink clear liquids frequently during the day (electrolyte or re-hydrating fluids are best. Your caregiver can help you with suggestions). Drink often to prevent loss of body fluids (dehydration). When diarrhea has cleared, eat small meals and more frequently. Avoid food additives and stimulants such as caffeine (coffee, tea, or chocolate). Enzyme supplements may help if you develop intolerance to a sugar in dairy products (lactose). Ask your caregiver or dietitian about specific dietary instructions.   Try to maintain a positive attitude. Learn relaxation techniques such as self hypnosis, mental imaging, and muscle relaxation.   If possible, avoid stresses which can aggravate your condition.   Exercise regularly.   Follow your diet.   Always get plenty of rest.  SEEK MEDICAL CARE IF:   Your symptoms  fail to improve after a week or two of new treatment.   You experience continued weight loss.   You have ongoing crampy digestion or loose bowels.   You develop a new skin rash, skin sores, or eye problems.  SEEK IMMEDIATE MEDICAL CARE IF:   You have worsening of your symptoms or develop new symptoms.   You  have a fever.   You develop bloody diarrhea.   You develop severe abdominal pain.  MAKE SURE YOU:   Understand these instructions.   Will watch your condition.   Will get help right away if you are not doing well or get worse.  Document Released: 01/02/2005 Document Revised: 12/05/2010 Document Reviewed: 12/01/2006 [facility] Patient Information 2012 [place].Intestinal Obstruction   An intestinal obstruction comes from a physical blockage in the bowel. Different problems in the bowel may cause these blockages.  CAUSES   Adhesions from previous surgeries   Cancer or tumor   A hernia, which is a condition in which a portion of the bowel bulges out through an opening or weakness in the abdomen (belly). This sometimes squeezes the bowel.   A swallowed object.   Blockage (impaction) with worms is common in third world countries.   A twisting of the bowel or telescoping of a portion of the bowel into another portion (intussusceptions)   Anything that stops food from going through from the stomach to the anus.  SYMPTOMS  Symptoms of bowel obstruction may result in your belly getting bigger (bloating), nausea, vomiting, explosive diarrhea, explosive stool. You may not be able to hear your normal bowel sounds (such as "growling in your stomach"). You may also stop having bowel movements or passing gas. DIAGNOSIS  Usually this condition is diagnosed with a history and an examination. Often, lab studies (blood work) and x-rays may be used to find the cause. TREATMENT  The main treatment of this condition is to rest the intestine (gut). Often times the obstruction may relieve itself and allow the gut to start working again. Think of the gut like a balloon that is blown up (filled with trapped food and water) which has squeezed into a hole or area which it cannot get through.   If the obstruction is complete, an NG tube (nasogastric) is passed through the nose and into the stomach. It  is then connected to suction to keep the stomach emptied out. This also helps treat the nausea and vomiting.   If there is an imbalance in the electrolytes, they are corrected with intravenous fluids. These have the proper chemicals in them to correct the problem.   If the reason for the obstruction (blockage) does not get better with conservative (non-surgical) treatment, surgery may be necessary. Sometimes, surgery is done immediately if your surgeon knows that the problem is not going to get better with conservative treatment.  PROGNOSIS  Depending on what the problem is, most of these problems can be treated by your caregivers with good results. Your surgeon will discuss the best course of action to take, with you or your loved ones. FOLLOWING SURGERY Seek immediate medical attention if you have:  Increasing abdominal pain, repeated vomiting, dehydration or fainting.   Severe weakness, chest pain or back pain.   Blood in your vomit, stool or you have tarry stool.  Document Released: 06/15/2003 Document Revised: 12/05/2010 Document Reviewed: 11/13/2007 [facility] Patient Information 2012 [place].

## 2011-06-06 NOTE — Progress Notes (Signed)
     [facility] Daily Rounding Note 06/06/2011, 9:04 AM  SUBJECTIVE:       Stools "loose and light".  2 to 3 yesterday, one this AM.  Passing lots of flatus.  Feels well.  Eating solids this AM.  Plan to DC home noted  OBJECTIVE:        General: looks well     Vital signs in last 24 hours:    Temp:  [97.8 F (36.6 C)-98.4 F (36.9 C)] 97.8 F (36.6 C) (02/28 0631) Pulse Rate:  [78-81] 79  (02/28 0631) Resp:  [18] 18  (02/28 0631) BP: (105-140)/(56-67) 140/65 mmHg (02/28 0631) SpO2:  [94 %-97 %] 95 % (02/28 0631) Last BM Date: 06/05/11  Heart: RRR Chest: clear B Abdomen: nontender. Nondistended.  Active BS.  Extremities: slight left . Right pedal edema Neuro/Psych:  Pleasant, engaged, not confused or agitated.  Intake/Output from previous day: 02/27 0701 - 02/28 0700 In: 600 [P.O.:600] Out: -   Intake/Output this shift:    Lab Results:  Basename 06/05/11 0655 06/04/11 0625  WBC 6.1 5.5  HGB 12.8* 13.4  HCT 39.8 40.7  PLT 205 184   BMET  Basename 06/04/11 0625  NA 139  K 4.0  CL 104  CO2 25  GLUCOSE 114*  BUN 18  CREATININE 1.03  CALCIUM 8.6   ASSESMENT: 1.  PSBO: resolved. ? Due to stricturing from Crohns, ? From adhesions, ? From active Crohns. 2.  Chrohns dz.   3.  Dysphagia, EGD/Dilt to be rescheduled after he sees Dr Ardis Hughs in March 4.  Black heme positive stool and cg NGTsuction: resolved.   PLAN: 1.  Agree with DC home today.  Agree with Dr Keane Police 2 week steroid taper. 2.  3/26 ROV with Dr. Ardis Hughs. 3.  Maintenance Crohns meds as PTA   LOS: 4 days   [name]  06/06/2011, 9:04 AM Pager: [phone]

## 2011-06-06 NOTE — Progress Notes (Signed)
Utilization review completed. Rozanna Boer, RN, BSN. 06/06/11

## 2011-06-06 NOTE — Progress Notes (Signed)
[facility] ([facility]) - ATTENDING: Patient with resolution of partial SBO vs Crohn's flare.  Will need assessment of colon and anastomosis at some point in the future with colonoscopy.  Will be happy to see back in our practice as needed. Earnstine Regal, MD, [facility] Office: [phone]

## 2011-06-06 NOTE — Progress Notes (Signed)
  Subjective: Pt ok. Had good BMs yesterday. Reg diet ordered for this am. He no longer feels bloated. No N/V  Objective: Vital signs in last 24 hours: Temp:  [97.8 F (36.6 C)-98.4 F (36.9 C)] 97.8 F (36.6 C) (02/28 0631) Pulse Rate:  [78-81] 79  (02/28 0631) Resp:  [18] 18  (02/28 0631) BP: (105-140)/(56-67) 140/65 mmHg (02/28 0631) SpO2:  [94 %-97 %] 95 % (02/28 0631) Last BM Date: 06/05/11  Intake/Output this shift:    Physical Exam: BP 140/65  Pulse 79  Temp(Src) 97.8 F (36.6 C) (Oral)  Resp 18  Ht 5' 10"  (1.778 m)  Wt 78 kg (171 lb 15.3 oz)  BMI 24.67 kg/m2  SpO2 95% Abdomen: soft, ND, NT  Labs: CBC  Basename 06/05/11 0655 06/04/11 0625  WBC 6.1 5.5  HGB 12.8* 13.4  HCT 39.8 40.7  PLT 205 184   BMET  Basename 06/04/11 0625  NA 139  K 4.0  CL 104  CO2 25  GLUCOSE 114*  BUN 18  CREATININE 1.03  CALCIUM 8.6   LFT No results found for this basename: PROT,ALBUMIN,AST,ALT,ALKPHOS,BILITOT,BILIDIR,IBILI,LIPASE in the last 72 hours PT/INR No results found for this basename: LABPROT:2,INR:2 in the last 72 hours ABG No results found for this basename: PHART:2,PCO2:2,PO2:2,HCO3:2 in the last 72 hours  Studies/Results: No results found.  Assessment: Principal Problem:  *SBO (small bowel obstruction) Active Problems:  HYPERTENSION  GERD  CROHN'S DISEASE     Plan: Resolved PSBO Advanced diet as ordered.  Prob DC later today. Signing off.  LOS: 4 days    Kandis Fantasia 06/06/2011 8:41 AM

## 2011-06-06 NOTE — Progress Notes (Signed)
Patient discharged to home in care of children. Medications and instructions reviewed with patient with questions answered. IV d/c'd with cath intact. Assessment unchanged from this am.  Patient is to follow up with Dr. Virgina Jock in 2 weeks and with Dr. Ardis Hughs on 3.26.13.

## 2011-06-12 ENCOUNTER — Other Ambulatory Visit: Payer: Medicare Other | Admitting: Gastroenterology

## 2011-06-20 ENCOUNTER — Telehealth: Payer: Self-pay

## 2011-06-20 NOTE — Telephone Encounter (Signed)
Pt has been notified.

## 2011-06-20 NOTE — Telephone Encounter (Signed)
The SBO was more likely from adhesive disease than active IBD so it is ok to stay OFF prednisone after tomorrow's dose

## 2011-06-20 NOTE — Telephone Encounter (Signed)
The pt was put on Prednisone while in the hospital by Dr Virgina Jock for a SBO.  The pt has f/u with Dr Ardis Hughs on 07/02/11.  His last dose of prednisone 2.5 mg is tomorrow and he is concerned that he should stay on it until his office visit with Dr Ardis Hughs.  Do you want him to continue the prednisone?

## 2011-07-02 ENCOUNTER — Ambulatory Visit (INDEPENDENT_AMBULATORY_CARE_PROVIDER_SITE_OTHER): Payer: Medicare Other | Admitting: Gastroenterology

## 2011-07-02 ENCOUNTER — Encounter: Payer: Self-pay | Admitting: Gastroenterology

## 2011-07-02 VITALS — BP 126/76 | HR 84 | Ht 70.0 in | Wt 166.0 lb

## 2011-07-02 DIAGNOSIS — K5090 Crohn's disease, unspecified, without complications: Secondary | ICD-10-CM

## 2011-07-02 MED ORDER — CHOLESTYRAMINE 4 G PO PACK: 1.0000 | PACK | Freq: Every day | ORAL | Status: DC

## 2011-07-02 NOTE — Progress Notes (Signed)
GI PROBLEM LIST:  1. Crohn's disease. Distant ileal and right colon resection by Dr. Gretta Cool in the very distant past. He was maintained on Pentasa, Entocort, and 24m of Purinethol for several years under the care of Dr. SLyla Son Hospitalization, May 2008, for acute myocardial infarction complicated by small bowel obstruction likely due to active Crohn's. Increased Purinethol to 100 mg daily and liver tests increased as well. Purinethol held and liver test normalized. The patient felt much better overall as well (less diarrhea, less fatigue). Winter, 2009: currently on 6 pills of Pentasa day feeling quite well overall. Summer, 2010 postoperative ileus following spine surgery (doubt active Crohn's flare). November, 2011: started cholestyramine with very good results (loose stools MUCH improved, only going 3-4 times a day) 2. Dysphagia February 2012: Barium esophagram showed mild narrowing at his GE junction. Was planning to perform dilation off Plavix (if okay with his cardiologist) when he had a bowel obstruction. 3. partial small bowel obstruction, February 2012.  CT suggested transition point in the pelvis.  Not clear if there was active Crohn's disease contributing to this, however he was put on prednisone in hosp; started 330ma day, taper off quickly.  HPI: This is a very pleasant [age]o man whom I last saw about 5-6 weeks ago.  Shortly after his office visit he suffered partial SBO; this was treated with Iv steroids, Ng tube.  See above.   He tapered prednisone to off since d/c and feels like he is back to about his usual.  He is very convinced that steroids help his pSBO and also tells me that his dysphagia has resolved.  He did not restart cholestyramine which had been signficantly helping him in recent past.    Past Medical History  Diagnosis Date  . Hypertension   . Hyperlipidemia   . Coronary artery disease     [facility] [facility] [facility]; Myoview 11/11 EF 71%, no scar, no ischemia;     cath 12/20/10:   pLAD 30-40%, stent ok, ? If stent oversized, EF 60% and low LVEDP  . GERD (gastroesophageal reflux disease)   . Other esophagitis   . Diaphragmatic hernia without mention of obstruction or gangrene   . Ulcerative (chronic) ileocolitis   . Diverticulosis of colon (without mention of hemorrhage)   . Other chronic nonalcoholic liver disease   . Regional enteritis of small intestine   . Flatulence, eructation, and gas pain   . Ischemic cardiomyopathy     EF initially 35-40% after MI 1/09; echo 7/08: EF 60%  . Fatty liver     on ultrasound of 10/2009    Past Surgical History  Procedure Date  . Cardiac catheterization 09/25/06    EF 35-40% but more recently 60%  . Cholecystectomy   . Appendectomy   . Back surgery     L3, L4, L5    Current Outpatient Prescriptions  Medication Sig Dispense Refill  . antiseptic oral rinse (BIOTENE) LIQD 15 mLs by Mouth Rinse route 2 times daily at 12 noon and 4 pm.      . aspirin EC 81 MG tablet Take 81 mg by mouth daily.      . Marland Kitchentorvastatin (LIPITOR) 40 MG tablet Take 40 mg by mouth daily.        . carvedilol (COREG) 3.125 MG tablet Take 3.125 mg by mouth 2 (two) times daily with a meal.       .  cholecalciferol (VITAMIN D) 1000 UNITS tablet Take 2,000 Units by mouth daily.      . cyanocobalamin 1000 MCG tablet Take 100 mcg by mouth daily.      . isosorbide mononitrate (IMDUR) 30 MG 24 hr tablet Take 30 mg by mouth daily.      . Lansoprazole (PREVACID PO) Take 1 tablet by mouth daily.       Marland Kitchen lisinopril (PRINIVIL,ZESTRIL) 10 MG tablet Take 10 mg by mouth daily.        Marland Kitchen loperamide (IMODIUM) 2 MG capsule Take 2 mg by mouth 4 (four) times daily as needed.      . mesalamine (PENTASA) 250 MG CR capsule Take 500 mg by mouth 3 (three) times daily.      . nitroGLYCERIN (NITROSTAT) 0.4 MG SL tablet Place 0.4 mg under the tongue every 5 (five) minutes as needed. For chest pain.      Marland Kitchen  ondansetron (ZOFRAN-ODT) 8 MG disintegrating tablet Take 4-8 mg by mouth every 8 (eight) hours as needed. For nausea.      Marland Kitchen pyridOXINE (VITAMIN B-6) 100 MG tablet Take 100 mg by mouth daily.        . Testosterone (ANDROGEL PUMP TD) Place 2 application onto the skin daily. 2 pumps once daily         Allergies as of 07/02/2011  . (No Known Allergies)    Family History  Problem Relation Age of Onset  . Coronary artery disease    . Heart failure Mother   . Emphysema Father   . Heart disease Brother   . Heart disease Brother   . Colon cancer Brother     History   Social History  . Marital Status: Married    Spouse Name: N/A    Number of Children: 3  . Years of Education: N/A   Occupational History  . CEO-retired    Social History Main Topics  . Smoking status: Never Smoker   . Smokeless tobacco: Never Used  . Alcohol Use: Yes     occ.  . Drug Use: No  . Sexually Active: Not on file   Other Topics Concern  . Not on file   Social History Narrative  . No narrative on file      Physical Exam: BP 126/76  Pulse 84  Ht 5' 10"  (1.778 m)  Wt 166 lb (75.297 kg)  BMI 23.82 kg/m2 Constitutional: generally well-appearing Psychiatric: alert and oriented x3 Abdomen: soft, nontender, nondistended, no obvious ascites, no peritoneal signs, normal bowel sounds     Assessment and plan: 78 y.o. male with crohn's, recent psbo  Question of current active disease.  He will restart his cholestyramine and return to see me in 4-5 weeks. Will consider colonoscopy to restage his disease, vs simply restarting immunomodulators (was on 68 purenithol for a long time, I increased this 2-3 years ago and he had lft elevations.

## 2011-07-02 NOTE — Patient Instructions (Addendum)
Restart cholestryamine powder, one dose once a day. Return to see Dr. Ardis Hughs in 4-5 weeks in the office.

## 2011-07-03 ENCOUNTER — Other Ambulatory Visit: Payer: Self-pay | Admitting: Gastroenterology

## 2011-08-02 ENCOUNTER — Encounter: Payer: Self-pay | Admitting: Internal Medicine

## 2011-08-06 ENCOUNTER — Ambulatory Visit (INDEPENDENT_AMBULATORY_CARE_PROVIDER_SITE_OTHER): Payer: Medicare Other | Admitting: Gastroenterology

## 2011-08-06 ENCOUNTER — Encounter: Payer: Self-pay | Admitting: Gastroenterology

## 2011-08-06 VITALS — BP 124/76 | HR 88 | Ht 70.0 in | Wt 170.0 lb

## 2011-08-06 DIAGNOSIS — K5090 Crohn's disease, unspecified, without complications: Secondary | ICD-10-CM

## 2011-08-06 NOTE — Progress Notes (Signed)
GI PROBLEM LIST:  1. Crohn's disease. Distant ileal and right colon resection by Dr. Gretta Cool in the very distant past. He was maintained on Pentasa, Entocort, and 92m of Purinethol for several years under the care of Dr. SLyla Son Hospitalization, May 2008, for acute myocardial infarction complicated by small bowel obstruction likely due to active Crohn's. Increased Purinethol to 100 mg daily and liver tests increased as well. Purinethol held and liver test normalized. The patient felt much better overall as well (less diarrhea, less fatigue). Winter, 2009: currently on 6 pills of Pentasa day feeling quite well overall. Summer, 2010 postoperative ileus following spine surgery (doubt active Crohn's flare). November, 2011: started cholestyramine with very good results (loose stools MUCH improved, only going 3-4 times a day)  2. Dysphagia February 2012: Barium esophagram showed mild narrowing at his GE junction. Was planning to perform dilation off Plavix (if okay with his cardiologist) when he had a bowel obstruction.  3. partial small bowel obstruction, February 2012. CT suggested transition point in the pelvis. Not clear if there was active Crohn's disease contributing to this, however he was put on prednisone in hosp; started 352ma day, taper off quickly.   HPI: This is a  Very pleasant [age]o man  I last saw about one month ago.  He tells me he is doing very well since then  He restarted cholestyramine last month and is "doing better."  Has his usual 3-4 bowel movements a day.  No bleeding. No signficant abd pains.  Still with back pains, and sore hamstrings.     Past Medical History  Diagnosis Date  . Hypertension   . Hyperlipidemia   . Coronary artery disease     [facility] [facility]; Myoview 11/11 EF 71%, no scar, no ischemia;     cath 12/20/10:   pLAD 30-40%, stent ok, ? If stent oversized, EF 60% and low LVEDP  . GERD  (gastroesophageal reflux disease)   . Other esophagitis   . Diaphragmatic hernia without mention of obstruction or gangrene   . Ulcerative (chronic) ileocolitis   . Diverticulosis of colon (without mention of hemorrhage)   . Other chronic nonalcoholic liver disease   . Regional enteritis of small intestine   . Flatulence, eructation, and gas pain   . Ischemic cardiomyopathy     EF initially 35-40% after MI 1/09; echo 7/08: EF 60%  . Fatty liver     on ultrasound of 10/2009    Past Surgical History  Procedure Date  . Cardiac catheterization 09/25/06    EF 35-40% but more recently 60%  . Cholecystectomy   . Appendectomy   . Back surgery     L3, L4, L5    Current Outpatient Prescriptions  Medication Sig Dispense Refill  . antiseptic oral rinse (BIOTENE) LIQD 15 mLs by Mouth Rinse route 2 times daily at 12 noon and 4 pm.      . aspirin EC 81 MG tablet Take 81 mg by mouth daily.      . Marland Kitchentorvastatin (LIPITOR) 40 MG tablet Take 40 mg by mouth daily.        . carvedilol (COREG) 3.125 MG tablet Take 3.125 mg by mouth 2 (two) times daily with a meal.       . cholecalciferol (VITAMIN D) 1000 UNITS tablet Take 2,000 Units by mouth daily.      . cholestyramine (QUESTRAN) 4 G packet Take 1  packet by mouth daily at 6 (six) AM.  30 each  11  . cyanocobalamin 1000 MCG tablet Take 100 mcg by mouth daily.      . isosorbide mononitrate (IMDUR) 30 MG 24 hr tablet Take 30 mg by mouth daily.      . Lansoprazole (PREVACID PO) Take 1 tablet by mouth daily.       Marland Kitchen lisinopril (PRINIVIL,ZESTRIL) 10 MG tablet Take 10 mg by mouth daily.        Marland Kitchen loperamide (IMODIUM) 2 MG capsule Take 2 mg by mouth 4 (four) times daily as needed.      . nitroGLYCERIN (NITROSTAT) 0.4 MG SL tablet Place 0.4 mg under the tongue every 5 (five) minutes as needed. For chest pain.      Marland Kitchen ondansetron (ZOFRAN-ODT) 8 MG disintegrating tablet Take 4-8 mg by mouth every 8 (eight) hours as needed. For nausea.      Marland Kitchen PENTASA 250 MG CR  capsule TAKE TWO CAPSULES THREE TIMES DAILY  180 each  1  . pyridOXINE (VITAMIN B-6) 100 MG tablet Take 100 mg by mouth daily.        . Testosterone (ANDROGEL PUMP TD) Place 2 application onto the skin daily. 2 pumps once daily         Allergies as of 08/06/2011  . (No Known Allergies)    Family History  Problem Relation Age of Onset  . Coronary artery disease    . Heart failure Mother   . Emphysema Father   . Heart disease Brother   . Heart disease Brother   . Colon cancer Brother     History   Social History  . Marital Status: Married    Spouse Name: N/A    Number of Children: 3  . Years of Education: N/A   Occupational History  . CEO-retired    Social History Main Topics  . Smoking status: Never Smoker   . Smokeless tobacco: Never Used  . Alcohol Use: Yes     occ.  . Drug Use: No  . Sexually Active: Not on file   Other Topics Concern  . Not on file   Social History Narrative  . No narrative on file      Physical Exam: BP 124/76  Pulse 88  Ht 5' 10"  (1.778 m)  Wt 170 lb (77.111 kg)  BMI 24.39 kg/m2 Constitutional: generally well-appearing Psychiatric: alert and oriented x3 Abdomen: soft, nontender, nondistended, no obvious ascites, no peritoneal signs, normal bowel sounds     Assessment and plan: 78 y.o. male with Crohn's disease.  Currently doing well on mesalamine only. I have considered restaging his Crohn's disease with colonoscopy especially given his recent small bowel obstruction. He is doing very well, he is not so interested in that and I think he has responded quite well to a pulse of steroids and back on his maintenance medicines that I am not going to pursue it at this point. He'll return to see me in 3 months and sooner if needed.

## 2011-08-06 NOTE — Patient Instructions (Signed)
Continue current meds Return to see Dr. Ardis Hughs in 3 months, sooner if needed.

## 2011-09-03 ENCOUNTER — Other Ambulatory Visit: Payer: Self-pay | Admitting: Gastroenterology

## 2011-12-25 ENCOUNTER — Other Ambulatory Visit: Payer: Self-pay | Admitting: Neurology

## 2011-12-25 DIAGNOSIS — R413 Other amnesia: Secondary | ICD-10-CM

## 2011-12-25 DIAGNOSIS — R269 Unspecified abnormalities of gait and mobility: Secondary | ICD-10-CM

## 2011-12-25 DIAGNOSIS — G20 Parkinson's disease: Secondary | ICD-10-CM

## 2011-12-30 ENCOUNTER — Inpatient Hospital Stay
Admission: RE | Admit: 2011-12-30 | Discharge: 2011-12-30 | Payer: Medicare Other | Source: Ambulatory Visit | Attending: Neurology | Admitting: Neurology

## 2011-12-30 ENCOUNTER — Ambulatory Visit
Admission: RE | Admit: 2011-12-30 | Discharge: 2011-12-30 | Disposition: A | Discharge status: Home / Self Care | Payer: Medicare Other | Source: Ambulatory Visit | Attending: Neurology | Admitting: Neurology

## 2011-12-30 DIAGNOSIS — G20 Parkinson's disease: Secondary | ICD-10-CM

## 2011-12-30 DIAGNOSIS — R269 Unspecified abnormalities of gait and mobility: Secondary | ICD-10-CM

## 2011-12-30 DIAGNOSIS — R413 Other amnesia: Secondary | ICD-10-CM

## 2012-01-06 ENCOUNTER — Other Ambulatory Visit: Payer: Self-pay | Admitting: Physician Assistant

## 2012-01-13 ENCOUNTER — Other Ambulatory Visit: Payer: Self-pay | Admitting: Gastroenterology

## 2012-01-30 ENCOUNTER — Ambulatory Visit (HOSPITAL_COMMUNITY)
Admission: RE | Admit: 2012-01-30 | Discharge: 2012-01-30 | Disposition: A | Discharge status: Home / Self Care | Payer: Medicare Other | Source: Ambulatory Visit | Attending: Internal Medicine | Admitting: Internal Medicine

## 2012-01-30 VITALS — BP 110/74 | HR 88 | Wt 168.5 lb

## 2012-01-30 DIAGNOSIS — I10 Essential (primary) hypertension: Secondary | ICD-10-CM | POA: Insufficient documentation

## 2012-01-30 DIAGNOSIS — I2510 Atherosclerotic heart disease of native coronary artery without angina pectoris: Secondary | ICD-10-CM | POA: Insufficient documentation

## 2012-01-30 NOTE — Assessment & Plan Note (Addendum)
Reviewed recent cath results with him. CAD stable. No evidence of ischemia. Continue current regimen. Stressed need to be as active as possible.

## 2012-01-30 NOTE — Assessment & Plan Note (Signed)
Followed by Dr. Virgina Jock. Goal LDL < 70. Continue statin.

## 2012-01-30 NOTE — Progress Notes (Signed)
History of Present Illness: PCP: Shon Baton, MD  [name] [name] is a 78 y.o. male presents for shoulder pain and fatigue.  He has a history of CAD, status post anterior ST elevation myocardial infarction treated with bare-metal stent to the LAD at [facility] in [place] in 1/09.  His EF was 35-40%.  This improved to 60%.  Last echocardiogram performed 7/08.  Last Myoview 11/11: EF 71%, no scar or ischemia.  He also has a history of hypertension, hyperlipidemia, Crohn's disease, GERD.    Richardson Dopp saw him on 9/7 for bilateral shoulder tightness with exertion similar to previous angina.  He had cath by Dr. Johnsie Cancel on 9/13: pLAD 30-40%, stent ok, ? If stent oversized, EF 60% and low LVEDP.  Medical therapy continued.    He returns for follow up.  Of note, he has seen his PCP for fatigue and recently found to have Parkinson's disease. Followed by Dr. Jannifer Franklin. Continue to play golf  No chest pain. No dyspnea.  No syncope.  No edema.  Living at [facility]  Past Medical History  Diagnosis Date  . Hypertension   . Hyperlipidemia   . Coronary artery disease     s/p ANTERIOR STEMI 1/09 at [place]; treated with a bare-metal stent to the LAD; Myoview 11/11 EF 71%, no scar, no ischemia;     cath 12/20/10:   pLAD 30-40%, stent ok, ? If stent oversized, EF 60% and low LVEDP  . GERD (gastroesophageal reflux disease)   . Other esophagitis   . Diaphragmatic hernia without mention of obstruction or gangrene   . Ulcerative (chronic) ileocolitis   . Diverticulosis of colon (without mention of hemorrhage)   . Fatty liver disease, non-alcoholic   . Regional enteritis of small intestine   . Flatulence, eructation, and gas pain   . Ischemic cardiomyopathy     EF initially 35-40% after MI 1/09; echo 7/08: EF 60%  . Fatty liver     on ultrasound of 10/2009    Current Outpatient Prescriptions  Medication Sig Dispense Refill  . aspirin EC 81 MG tablet Take 81 mg by mouth daily.      Marland Kitchen atorvastatin (LIPITOR) 40 MG  tablet Take 40 mg by mouth daily.        . carbidopa-levodopa (SINEMET IR) 25-100 MG per tablet Take 0.5 tablets by mouth 3 (three) times daily.      . carvedilol (COREG) 6.25 MG tablet Take 6.25 mg by mouth 2 (two) times daily with a meal.      . cholecalciferol (VITAMIN D) 1000 UNITS tablet Take 2,000 Units by mouth daily.      . cholestyramine (QUESTRAN) 4 G packet Take 1 packet by mouth daily at 6 (six) AM.  30 each  11  . clopidogrel (PLAVIX) 75 MG tablet Take 75 mg by mouth daily.      . cyanocobalamin 1000 MCG tablet Take 100 mcg by mouth daily.      . isosorbide mononitrate (IMDUR) 30 MG 24 hr tablet TAKE ONE TABLET EACH DAY  30 tablet  6  . Lansoprazole (PREVACID PO) Take 1 tablet by mouth daily.       Marland Kitchen lisinopril (PRINIVIL,ZESTRIL) 10 MG tablet Take 10 mg by mouth daily.        . metoCLOPramide (REGLAN) 5 MG tablet Take 5 mg by mouth every 8 (eight) hours as needed.      . nitroGLYCERIN (NITROSTAT) 0.4 MG SL tablet Place 0.4 mg under the tongue every  5 (five) minutes as needed. For chest pain.      Marland Kitchen PENTASA 250 MG CR capsule TAKE TWO CAPSULES THREE TIMES DAILY  180 capsule  1  . Potassium Bicarbonate 99 MG CAPS Take 1 capsule by mouth daily.      Marland Kitchen pyridOXINE (VITAMIN B-6) 100 MG tablet Take 100 mg by mouth daily.        . Testosterone (ANDROGEL PUMP TD) Place 2 application onto the skin daily. 2 pumps once daily       . DISCONTD: isosorbide mononitrate (IMDUR) 30 MG 24 hr tablet Take 30 mg by mouth daily.        Allergies: No Known Allergies  Social history:  Nonsmoker  Vital Signs: BP 110/74  Pulse 88  Wt 168 lb 8 oz (76.431 kg)  SpO2 97%  PHYSICAL EXAM: Well nourished, well developed, in no acute distress HEENT: normal Neck: no JVD Cardiac:  normal S1, S2; RRR; no murmur Lungs:  clear to auscultation bilaterally, no wheezing, rhonchi or rales Abd: soft, nontender, no hepatomegaly Ext: no edema; cyanosis or clubbing Neuro:  CNs 2-12 intact, no focal abnormalities  noted. Mild tremor Psych: Normal affect  ASSESSMENT AND PLAN:

## 2012-01-30 NOTE — Assessment & Plan Note (Signed)
No evidence of ischemia. Continue current regimen.

## 2012-02-04 ENCOUNTER — Encounter: Payer: Self-pay | Admitting: Gastroenterology

## 2012-02-04 ENCOUNTER — Ambulatory Visit (INDEPENDENT_AMBULATORY_CARE_PROVIDER_SITE_OTHER): Payer: Medicare Other | Admitting: Gastroenterology

## 2012-02-04 VITALS — BP 104/70 | HR 96 | Ht 70.0 in | Wt 168.8 lb

## 2012-02-04 DIAGNOSIS — K5090 Crohn's disease, unspecified, without complications: Secondary | ICD-10-CM

## 2012-02-04 MED ORDER — CHOLESTYRAMINE 4 G PO PACK: 1.0000 | PACK | Freq: Two times a day (BID) | ORAL | Status: DC

## 2012-02-04 NOTE — Patient Instructions (Addendum)
Increase your Lucrezia Starch to twice daily (new script called in today). Return office visit in 6 months.

## 2012-02-04 NOTE — Progress Notes (Signed)
GI PROBLEM LIST:  1. Crohn's disease. Distant ileal and right colon resection by Dr. Gretta Cool in the very distant past. He was maintained on Pentasa, Entocort, and 48m of Purinethol for several years under the care of Dr. SLyla Son Hospitalization, May 2008, for acute myocardial infarction complicated by small bowel obstruction likely due to active Crohn's. Increased Purinethol to 100 mg daily and liver tests increased as well. Purinethol held and liver test normalized. The patient felt much better overall as well (less diarrhea, less fatigue). Winter, 2009: currently on 6 pills of Pentasa day feeling quite well overall. Summer, 2010 postoperative ileus following spine surgery (doubt active Crohn's flare). November, 2011: started cholestyramine with very good results (loose stools MUCH improved, only going 3-4 times a day)  2. Dysphagia February 2012: Barium esophagram showed mild narrowing at his GE junction. Was planning to perform dilation off Plavix (if okay with his cardiologist) when he had a bowel obstruction.  3. partial small bowel obstruction, February 2012. CT suggested transition point in the pelvis. Not clear if there was active Crohn's disease contributing to this, however he was put on prednisone in hosp; started 332ma day, taper off quickly.   HPI: This is a   very pleasant [age]ear old man whom I last saw 6 months ago.  Was diagnosed with Parkinsons by Dr. WiJannifer Franklinn past several weeks. Was having balance issues, walking trouble. This led to brain scan. He was started on sinemet and this helped his balance, dizziness.  He feels better.  He recently moved to [place] and is very happy with this new arrangement   He is having his usual 3-4 formed to loose stools per day.   No urgency or bleeding.  He has been wearing adult diapers but downplays this   Past Medical History  Diagnosis Date  . Hypertension   . Hyperlipidemia   . Coronary artery disease     [facility]; Myoview 11/11 EF 71%, no scar, no ischemia;     cath 12/20/10:   pLAD 30-40%, stent ok, ? If stent oversized, EF 60% and low LVEDP  . GERD (gastroesophageal reflux disease)   . Other esophagitis   . Diaphragmatic hernia without mention of obstruction or gangrene   . Ulcerative (chronic) ileocolitis   . Diverticulosis of colon (without mention of hemorrhage)   . Other chronic nonalcoholic liver disease   . Regional enteritis of small intestine   . Flatulence, eructation, and gas pain   . Ischemic cardiomyopathy     EF initially 35-40% after MI 1/09; echo 7/08: EF 60%  . Fatty liver     on ultrasound of 10/2009  . Parkinson's disease     Past Surgical History  Procedure Date  . Cardiac catheterization 09/25/06    EF 35-40% but more recently 60%  . Cholecystectomy   . Appendectomy   . Back surgery     L3, L4, L5    Current Outpatient Prescriptions  Medication Sig Dispense Refill  . aspirin EC 81 MG tablet Take 81 mg by mouth daily.      . Marland Kitchentorvastatin (LIPITOR) 40 MG tablet Take 40 mg by mouth daily.        . carbidopa-levodopa (SINEMET IR) 25-100 MG per tablet Take 0.5 tablets by mouth 3 (three) times daily.      . carvedilol (COREG) 6.25 MG tablet Take 6.25 mg by mouth 2 (two) times  daily with a meal.      . cholecalciferol (VITAMIN D) 1000 UNITS tablet Take 2,000 Units by mouth daily.      . cholestyramine (QUESTRAN) 4 G packet Take 1 packet by mouth daily at 6 (six) AM.  30 each  11  . clopidogrel (PLAVIX) 75 MG tablet Take 75 mg by mouth daily.      . cyanocobalamin 1000 MCG tablet Take 100 mcg by mouth daily.      . isosorbide mononitrate (IMDUR) 30 MG 24 hr tablet TAKE ONE TABLET EACH DAY  30 tablet  6  . Lansoprazole (PREVACID PO) Take 1 tablet by mouth daily.       Marland Kitchen lisinopril (PRINIVIL,ZESTRIL) 10 MG tablet Take 10 mg by mouth daily.        . metoCLOPramide (REGLAN) 5 MG tablet Take 5 mg by mouth every 8 (eight)  hours as needed.      . nitroGLYCERIN (NITROSTAT) 0.4 MG SL tablet Place 0.4 mg under the tongue every 5 (five) minutes as needed. For chest pain.      . NON FORMULARY Carbitona-levodopa 25-100 take 1/2 tablet three times daily for Parkinson's disease      . PENTASA 250 MG CR capsule TAKE TWO CAPSULES THREE TIMES DAILY  180 capsule  1  . Potassium Bicarbonate 99 MG CAPS Take 1 capsule by mouth daily.      Marland Kitchen pyridOXINE (VITAMIN B-6) 100 MG tablet Take 100 mg by mouth daily.        . Testosterone (ANDROGEL PUMP TD) Place 2 application onto the skin daily. 2 pumps once daily         Allergies as of 02/04/2012  . (No Known Allergies)    Family History  Problem Relation Age of Onset  . Coronary artery disease    . Heart failure Mother   . Emphysema Father   . Heart disease Brother   . Heart disease Brother   . Colon cancer Brother     History   Social History  . Marital Status: Married    Spouse Name: N/A    Number of Children: 3  . Years of Education: N/A   Occupational History  . CEO-retired    Social History Main Topics  . Smoking status: Never Smoker   . Smokeless tobacco: Never Used  . Alcohol Use: Yes     occ.  . Drug Use: No  . Sexually Active: Not on file   Other Topics Concern  . Not on file   Social History Narrative  . No narrative on file      Physical Exam: BP 104/70  Pulse 96  Ht 5' 10"  (1.778 m)  Wt 168 lb 12.8 oz (76.567 kg)  BMI 24.22 kg/m2 Constitutional: generally well-appearing Psychiatric: alert and oriented x3 Abdomen: soft, nontender, nondistended, no obvious ascites, no peritoneal signs, normal bowel sounds     Assessment and plan: 78 y.o. male with Crohn's disease  He has always downplayed his chronic loose stools and he continues to do so. He is frankly not bothered by the fact that he has to wear a diaper and I explained to him that many people would be and recommended he try doubling his cholestyramine dose to see if we can  thicken his stools a bit, decrease the looseness.  He will try that. Over the past 2-3 years he is really slow down, now diagnosed with Parkinson's and he has moved to [place].  He is going to  get back in to see me in 6 months and sooner if needed.

## 2012-03-09 ENCOUNTER — Other Ambulatory Visit: Payer: Self-pay | Admitting: Gastroenterology

## 2012-08-10 ENCOUNTER — Other Ambulatory Visit: Payer: Self-pay | Admitting: Physician Assistant

## 2012-08-11 DIAGNOSIS — Z9889 Other specified postprocedural states: Secondary | ICD-10-CM | POA: Insufficient documentation

## 2012-08-12 ENCOUNTER — Other Ambulatory Visit: Payer: Self-pay | Admitting: Physician Assistant

## 2012-08-19 ENCOUNTER — Encounter: Payer: Self-pay | Admitting: Neurology

## 2012-08-19 DIAGNOSIS — R413 Other amnesia: Secondary | ICD-10-CM

## 2012-08-19 DIAGNOSIS — G20 Parkinson's disease: Secondary | ICD-10-CM

## 2012-08-19 DIAGNOSIS — R269 Unspecified abnormalities of gait and mobility: Secondary | ICD-10-CM

## 2012-08-20 ENCOUNTER — Ambulatory Visit (INDEPENDENT_AMBULATORY_CARE_PROVIDER_SITE_OTHER): Payer: Medicare Other | Admitting: Neurology

## 2012-08-20 ENCOUNTER — Encounter: Payer: Self-pay | Admitting: Neurology

## 2012-08-20 VITALS — BP 151/82 | HR 83 | Ht 65.0 in | Wt 174.0 lb

## 2012-08-20 DIAGNOSIS — R413 Other amnesia: Secondary | ICD-10-CM

## 2012-08-20 DIAGNOSIS — G20 Parkinson's disease: Secondary | ICD-10-CM

## 2012-08-20 DIAGNOSIS — R209 Unspecified disturbances of skin sensation: Secondary | ICD-10-CM

## 2012-08-20 MED ORDER — CARBIDOPA-LEVODOPA 25-100 MG PO TABS: ORAL_TABLET | ORAL | Status: DC

## 2012-08-20 NOTE — Progress Notes (Signed)
Reason for visit: Parkinson's disease  [name] [name] is an 79 y.o. male  History of present illness:  [name] [name] is an 79 year old right-handed white male with a history of Parkinson's disease. The patient indicates that he has remained active playing golf. The patient denies any significant balance issues, and he has not had any falls since last seen. The patient is reporting some burning sensations in the feet at nighttime, but he indicates that this does not prevent her from sleeping. The patient denies any problems with swallowing, and he denies any significant changes in his memory issues since last seen. The patient is on Sinemet taking the 25/100 mg tablets, one tablet in the morning and at midday, one half tablet in the evening. The patient was seen recently at [facility] by Dr. Linus [name], and MRI evaluation the brain was done. The results of this are not available to me. Given the burning sensations in the feet, nerve conduction studies and EMG evaluation was recommended. The patient returns for reevaluation. The patient does report some fatigue later on in the afternoon and evening.  Past Medical History  Diagnosis Date  . Hypertension   . Hyperlipidemia   . Coronary artery disease     s/p ANTERIOR STEMI 1/09 at [facility]; treated with a bare-metal stent to the LAD; Myoview 11/11 EF 71%, no scar, no ischemia;     cath 12/20/10:   pLAD 30-40%, stent ok, ? If stent oversized, EF 60% and low LVEDP  . GERD (gastroesophageal reflux disease)   . Other esophagitis   . Diaphragmatic hernia without mention of obstruction or gangrene   . Ulcerative (chronic) ileocolitis   . Diverticulosis of colon (without mention of hemorrhage)   . Other chronic nonalcoholic liver disease   . Regional enteritis of small intestine   . Flatulence, eructation, and gas pain   . Ischemic cardiomyopathy     EF initially 35-40% after MI 1/09; echo 7/08: EF 60%  . Fatty liver     on  ultrasound of 10/2009  . Parkinson's disease   . Gait disorder   . Memory disorder   . Osteoporosis   . Renal calculi   . Melanoma     Past Surgical History  Procedure Laterality Date  . Cardiac catheterization  09/25/06    EF 35-40% but more recently 60%  . Cholecystectomy    . Appendectomy    . Back surgery      L3, L4, L5  . Tonsillectomy    . Partial bowel resection    . Coronary artery stent placement    . Melanoma resection    . Cataract extraction      Bilateral    Family History  Problem Relation Age of Onset  . Coronary artery disease    . Heart failure Mother   . Emphysema Father   . Heart disease Brother   . Heart disease Brother   . Colon cancer Brother     Social history:  reports that he has never smoked. He has never used smokeless tobacco. He reports that  drinks alcohol. He reports that he does not use illicit drugs.  Allergies: No Known Allergies  Medications:  Current Outpatient Prescriptions on File Prior to Visit  Medication Sig Dispense Refill  . aspirin EC 81 MG tablet Take 81 mg by mouth daily.      Marland Kitchen atorvastatin (LIPITOR) 40 MG tablet Take 40 mg by mouth daily.        Marland Kitchen  carvedilol (COREG) 6.25 MG tablet Take 6.25 mg by mouth 2 (two) times daily with a meal.      . cholecalciferol (VITAMIN D) 1000 UNITS tablet Take 2,000 Units by mouth daily.      . cholestyramine (QUESTRAN) 4 G packet Take 1 packet by mouth 2 (two) times daily with a meal.  60 each  11  . clopidogrel (PLAVIX) 75 MG tablet Take 75 mg by mouth daily.      . cyanocobalamin 1000 MCG tablet Take 100 mcg by mouth daily.      . Dentifrices (BIOTENE DRY MOUTH DT) Place 15 mLs onto teeth 2 (two) times daily.      . isosorbide mononitrate (IMDUR) 30 MG 24 hr tablet TAKE ONE TABLET BY MOUTH ONCE DAILY  30 tablet  6  . lisinopril (PRINIVIL,ZESTRIL) 10 MG tablet Take 10 mg by mouth daily.        Marland Kitchen loperamide (IMODIUM) 2 MG capsule Take 2 mg by mouth 4 (four) times daily as needed for  diarrhea or loose stools.      . nitroGLYCERIN (NITROSTAT) 0.4 MG SL tablet Place 0.4 mg under the tongue every 5 (five) minutes as needed. For chest pain.      . NON FORMULARY Carbitona-levodopa 25-100 take 1/2 tablet three times daily for Parkinson's disease      . ondansetron (ZOFRAN-ODT) 8 MG disintegrating tablet Take 8 mg by mouth every 8 (eight) hours as needed for nausea.      Marland Kitchen PENTASA 250 MG CR capsule TAKE TWO CAPSULES THREE TIMES DAILY  180 capsule  6  . Potassium Bicarbonate 99 MG CAPS Take 1 capsule by mouth daily.      Marland Kitchen pyridOXINE (VITAMIN B-6) 100 MG tablet Take 100 mg by mouth daily.        . Testosterone (ANDROGEL PUMP TD) Place 2 application onto the skin daily. 2 pumps once daily        No current facility-administered medications on file prior to visit.    ROS:  Out of a complete 14 system review of symptoms, the patient complains only of the following symptoms, and all other reviewed systems are negative.  Fatigue Swelling in the legs Ringing in the ears Moles Blurred vision Short of breath Wheezing Diarrhea Feeling hot Memory loss, numbness, weakness Anxiety Restless legs   Blood pressure 151/82, pulse 83, height 5' 5"  (1.651 m), weight 174 lb (78.926 kg).  Physical Exam  General: The patient is alert and cooperative at the time of the examination. Mild masking of the face is seen.  Skin: No significant peripheral edema is noted.   Neurologic Exam  Mental status: The mental status examination done today shows a total score 27/30. The patient is able to name 13 animals in 60 seconds.  Cranial nerves: Facial symmetry is present. Speech is normal, no aphasia or dysarthria is noted. Extraocular movements are full. Visual fields are full.  Motor: The patient has good strength in all 4 extremities.  Coordination: The patient has good finger-nose-finger and heel-to-shin bilaterally.  Gait and station: The patient has a slight shuffling gait, mild  decrease in arm swing is seen. Tandem gait is unsteady. Romberg is negative. No drift is seen. The patient is able to arise from a seated position with arms crossed.  Reflexes: Deep tendon reflexes are symmetric.   Assessment/Plan:  One. Parkinson's disease  2. Memory disturbance  The patient will be followed for his memory issues. The patient is maintaining a good  level of physical activity, and this is helpful. The patient is tolerating the low dose of Sinemet at this time. Given the sensory complaints of burning in the feet over the last 6 months, the patient will undergo nerve conduction studies of both legs, and EMG evaluation of one leg. The patient will followup for the EMG. The patient has been taking metoclopramide intermittently for his stomach, and I have recommended that he stop this medication. The patient will continue on the current dose of Sinemet.  Jill Alexanders MD 08/20/2012 9:21 PM  [facility] [address] [place], [place] [address]  Phone [phone] Fax [phone]

## 2012-08-27 ENCOUNTER — Ambulatory Visit (INDEPENDENT_AMBULATORY_CARE_PROVIDER_SITE_OTHER): Payer: Medicare Other | Admitting: Neurology

## 2012-08-27 ENCOUNTER — Ambulatory Visit: Payer: Medicare Other | Attending: Neurology | Admitting: Physical Therapy

## 2012-08-27 ENCOUNTER — Encounter (INDEPENDENT_AMBULATORY_CARE_PROVIDER_SITE_OTHER): Payer: Medicare Other

## 2012-08-27 DIAGNOSIS — R269 Unspecified abnormalities of gait and mobility: Secondary | ICD-10-CM | POA: Insufficient documentation

## 2012-08-27 DIAGNOSIS — G20 Parkinson's disease: Secondary | ICD-10-CM

## 2012-08-27 DIAGNOSIS — G544 Lumbosacral root disorders, not elsewhere classified: Secondary | ICD-10-CM

## 2012-08-27 DIAGNOSIS — Z0289 Encounter for other administrative examinations: Secondary | ICD-10-CM

## 2012-08-27 DIAGNOSIS — G20A1 Parkinson's disease without dyskinesia, without mention of fluctuations: Secondary | ICD-10-CM | POA: Insufficient documentation

## 2012-08-27 DIAGNOSIS — IMO0001 Reserved for inherently not codable concepts without codable children: Secondary | ICD-10-CM | POA: Insufficient documentation

## 2012-08-27 DIAGNOSIS — R413 Other amnesia: Secondary | ICD-10-CM

## 2012-08-27 DIAGNOSIS — R209 Unspecified disturbances of skin sensation: Secondary | ICD-10-CM

## 2012-08-27 NOTE — Procedures (Signed)
  HISTORY:  [name] [name] is an 79 year old gentleman with a history of Parkinson's disease and 3 prior lumbosacral spine surgeries. The patient has developed some burning sensations in the feet over the last 6 months, and he is being evaluated for a possible peripheral neuropathy.  NERVE CONDUCTION STUDIES:  Nerve conduction studies were performed on both lower extremities. The distal motor latencies for the peroneal nerves were normal bilaterally, with low motor amplitude seen on the left, borderline normal on the right. The distal motor latencies and motor amplitudes for the posterior tibial nerves were normal bilaterally. The nerve conduction velocities for the peroneal and posterior tibial nerves were normal bilaterally. The H reflex latencies were symmetric and normal. The peroneal sensory latencies were normal bilaterally.  EMG STUDIES:  EMG study was performed on the left lower extremity:  The tibialis anterior muscle reveals 2 to 6K motor units with moderately decreased recruitment. 2 plus fibrillations and positive waves were seen. The peroneus tertius muscle reveals 2 to 5K motor units with decreased recruitment. 1 plus fibrillations and positive waves were seen. The medial gastrocnemius muscle reveals 1 to 2K motor units with moderately decreased recruitment. 3 plus fibrillations and positive waves were seen. The vastus lateralis muscle reveals 2 to 4K motor units with full recruitment. No fibrillations or positive waves were seen. The iliopsoas muscle reveals 2 to 4K motor units with full recruitment. No fibrillations or positive waves were seen. The biceps femoris muscle (long head) reveals 2 to 5K motor units with decreased recruitment. No fibrillations or positive waves were seen. The lumbosacral paraspinal muscles were tested at 3 levels, and revealed 1+ fibrillations and positive waves in the upper level. No electrically active muscle was seen at the middle and lower levels. There  was good relaxation.    IMPRESSION:  Nerve conduction studies done on both lower extremities were relatively unremarkable with exception of a low motor amplitude for the left peroneal nerve. There is sensory sparing of this nerve, and this finding could be consistent with an L5 radiculopathy. EMG evaluation of the left lower extremity shows acute and chronic denervation in the L5 and S1 nerve root distributions of the left leg. There is no clear evidence of a peripheral neuropathy on this evaluation. A small fiber neuropathy may be missed by a standard nerve conduction studies, however.  Jill Alexanders MD 08/27/2012 10:46 AM  [facility] [address] [place], [place] [address]  Phone [phone] Fax [phone]

## 2012-08-28 ENCOUNTER — Encounter (HOSPITAL_COMMUNITY): Payer: Self-pay

## 2012-08-28 ENCOUNTER — Ambulatory Visit (HOSPITAL_COMMUNITY)
Admission: RE | Admit: 2012-08-28 | Discharge: 2012-08-28 | Disposition: A | Discharge status: Home / Self Care | Payer: Medicare Other | Source: Ambulatory Visit | Attending: Internal Medicine | Admitting: Internal Medicine

## 2012-08-28 VITALS — BP 130/84 | HR 85 | Wt 174.8 lb

## 2012-08-28 DIAGNOSIS — E785 Hyperlipidemia, unspecified: Secondary | ICD-10-CM | POA: Insufficient documentation

## 2012-08-28 DIAGNOSIS — I2510 Atherosclerotic heart disease of native coronary artery without angina pectoris: Secondary | ICD-10-CM | POA: Insufficient documentation

## 2012-08-28 NOTE — Progress Notes (Addendum)
History of Present Illness: PCP: Shon Baton, MD  MARQUEE FUCHS is a 79 y.o. male with history of CAD, status post anterior ST elevation myocardial infarction treated with bare-metal stent to the LAD at [facility] in [place] in 1/09.  His EF was 35-40%.  This improved to 60%.  Last echocardiogram performed 7/08.  Last Myoview 11/11: EF 71%, no scar or ischemia.  He also has a history of hypertension, hyperlipidemia, Crohn's disease, GERD.    Richardson Dopp saw him on 9/7 for bilateral shoulder tightness with exertion similar to previous angina.  He had cath by Dr. Johnsie Cancel on 9/13: pLAD 30-40%, stent ok, ? If stent oversized, EF 60% and low LVEDP.  Medical therapy continued.    He returns for follow up today.  He feels well.  He continues to play golf.  Has trainer on Tuesdays.  He is starting neuro rehab.  He has shoulder tightness but it is relieved by exercise.  Denies chest pain.  No orthopnea, PND. Dr Linus Mako follow Parkinson's disease who says he has a mild case. Continues to live at [facility]   Labs 08/17/12: BUN 19, Cr 1.2, K 4.9, TC 89, Trig 83, HDL 38, LDL 34     Past Medical History  Diagnosis Date  . Hypertension   . Hyperlipidemia   . Coronary artery disease     s/p ANTERIOR STEMI 1/09 at [facility]; treated with a bare-metal stent to the LAD; Myoview 11/11 EF 71%, no scar, no ischemia;     cath 12/20/10:   pLAD 30-40%, stent ok, ? If stent oversized, EF 60% and low LVEDP  . GERD (gastroesophageal reflux disease)   . Other esophagitis   . Diaphragmatic hernia without mention of obstruction or gangrene   . Ulcerative (chronic) ileocolitis   . Diverticulosis of colon (without mention of hemorrhage)   . Other chronic nonalcoholic liver disease   . Regional enteritis of small intestine   . Flatulence, eructation, and gas pain   . Ischemic cardiomyopathy     EF initially 35-40% after MI 1/09; echo 7/08: EF 60%  . Fatty liver     on ultrasound of 10/2009  . Parkinson's disease   . Gait  disorder   . Memory disorder   . Osteoporosis   . Renal calculi   . Melanoma     Current Outpatient Prescriptions  Medication Sig Dispense Refill  . acetaminophen (TYLENOL) 325 MG tablet Take 650 mg by mouth every 6 (six) hours as needed for pain.      Marland Kitchen aspirin EC 81 MG tablet Take 81 mg by mouth daily.      Marland Kitchen atorvastatin (LIPITOR) 40 MG tablet Take 40 mg by mouth daily.        . carbidopa-levodopa (SINEMET IR) 25-100 MG per tablet One tablet in the morning and midday, and one half tablet at night  60 tablet  2  . carvedilol (COREG) 6.25 MG tablet Take 6.25 mg by mouth 2 (two) times daily with a meal.      . cholecalciferol (VITAMIN D) 1000 UNITS tablet Take 2,000 Units by mouth daily.      . cholestyramine (QUESTRAN) 4 G packet Take 1 packet by mouth 2 (two) times daily with a meal.  60 each  11  . isosorbide mononitrate (IMDUR) 30 MG 24 hr tablet TAKE ONE TABLET BY MOUTH ONCE DAILY  30 tablet  6  . lansoprazole (PREVACID) 30 MG capsule Take 30 mg by mouth daily.      Marland Kitchen  lisinopril (PRINIVIL,ZESTRIL) 10 MG tablet Take 10 mg by mouth daily.        Marland Kitchen loperamide (IMODIUM) 2 MG capsule Take 2 mg by mouth 4 (four) times daily as needed for diarrhea or loose stools.      . nitroGLYCERIN (NITROSTAT) 0.4 MG SL tablet Place 0.4 mg under the tongue every 5 (five) minutes as needed. For chest pain.      . NON FORMULARY Carbitona-levodopa 25-100 take 1/2 tablet three times daily for Parkinson's disease      . PENTASA 250 MG CR capsule TAKE TWO CAPSULES THREE TIMES DAILY  180 capsule  6  . Potassium Bicarbonate 99 MG CAPS Take 1 capsule by mouth daily.      Marland Kitchen pyridOXINE (VITAMIN B-6) 100 MG tablet Take 100 mg by mouth daily.        . Testosterone (ANDROGEL PUMP TD) Place 2 application onto the skin daily. 2 pumps once daily       . cyanocobalamin 1000 MCG tablet Take 100 mcg by mouth daily.       No current facility-administered medications for this encounter.    Allergies: No Known  Allergies  Social history:  Nonsmoker  Vital Signs: BP 130/84  Pulse 85  Wt 174 lb 12.8 oz (79.289 kg)  BMI 29.09 kg/m2  SpO2 98%  PHYSICAL EXAM: Well nourished, well developed, in no acute distress HEENT: normal Neck: no JVD Cardiac:  normal S1, S2; RRR; no murmur Lungs:  clear to auscultation bilaterally, no wheezing, rhonchi or rales Abd: soft, nontender, no hepatomegaly Ext: no edema; cyanosis or clubbing Neuro:  CNs 2-12 intact, no focal abnormalities noted. Mild tremor Psych: Normal affect  EKG: NSR 82 bpm, RBBB  ASSESSMENT AND PLAN:

## 2012-08-28 NOTE — Assessment & Plan Note (Addendum)
No ischemic symptoms.  Will continue current regimen.  Will have him discuss with Dr. Redmond Pulling if carvedilol needs to be stopped in setting of Parkinson's disease.    Patient seen and examined with Leone Haven, PA-C. We discussed all aspects of the encounter. I agree with the assessment and plan as stated above. No evidence of ischemia. Continue current regimen. Told him that he can have his neurologist stop carvedilol if they feel it is worsening his Parkinson's.

## 2012-08-28 NOTE — Assessment & Plan Note (Addendum)
Well controlled.  Continue lipitor.   Attending: Agree.

## 2012-09-07 ENCOUNTER — Ambulatory Visit: Payer: Medicare Other | Attending: Neurology | Admitting: Physical Therapy

## 2012-09-07 DIAGNOSIS — IMO0001 Reserved for inherently not codable concepts without codable children: Secondary | ICD-10-CM | POA: Insufficient documentation

## 2012-09-07 DIAGNOSIS — G20A1 Parkinson's disease without dyskinesia, without mention of fluctuations: Secondary | ICD-10-CM | POA: Insufficient documentation

## 2012-09-07 DIAGNOSIS — G20 Parkinson's disease: Secondary | ICD-10-CM | POA: Insufficient documentation

## 2012-09-07 DIAGNOSIS — R269 Unspecified abnormalities of gait and mobility: Secondary | ICD-10-CM | POA: Insufficient documentation

## 2012-09-10 ENCOUNTER — Ambulatory Visit: Payer: Medicare Other | Admitting: Physical Therapy

## 2012-09-14 ENCOUNTER — Ambulatory Visit: Payer: Medicare Other | Admitting: Physical Therapy

## 2012-09-17 ENCOUNTER — Ambulatory Visit: Payer: Medicare Other | Admitting: Physical Therapy

## 2012-09-18 ENCOUNTER — Ambulatory Visit: Payer: Medicare Other | Admitting: Physical Therapy

## 2012-09-21 ENCOUNTER — Ambulatory Visit: Payer: Medicare Other | Admitting: Physical Therapy

## 2012-09-24 ENCOUNTER — Ambulatory Visit: Payer: Medicare Other | Admitting: Physical Therapy

## 2012-09-27 ENCOUNTER — Encounter (HOSPITAL_COMMUNITY): Payer: Self-pay | Admitting: Emergency Medicine

## 2012-09-27 ENCOUNTER — Emergency Department (HOSPITAL_COMMUNITY)
Admission: EM | Admit: 2012-09-27 | Discharge: 2012-09-27 | Disposition: A | Discharge status: Home / Self Care | Payer: Medicare Other | Source: Home / Self Care | Attending: Emergency Medicine | Admitting: Emergency Medicine

## 2012-09-27 DIAGNOSIS — R338 Other retention of urine: Secondary | ICD-10-CM

## 2012-09-27 LAB — URINALYSIS, ROUTINE W REFLEX MICROSCOPIC
Bilirubin Urine: NEGATIVE
Glucose, UA: NEGATIVE mg/dL
Hgb urine dipstick: NEGATIVE
Ketones, ur: NEGATIVE mg/dL
Leukocytes, UA: NEGATIVE
Nitrite: NEGATIVE
Protein, ur: NEGATIVE mg/dL
Specific Gravity, Urine: 1.015 (ref 1.005–1.030)
Urobilinogen, UA: 0.2 mg/dL (ref 0.0–1.0)
pH: 5.5 (ref 5.0–8.0)

## 2012-09-27 NOTE — ED Provider Notes (Signed)
History     CSN: [id number]  Arrival date & time 09/27/12  1153   First MD Initiated Contact with Patient 09/27/12 1156      Chief Complaint  Patient presents with  . Constipation  . Urinary Retention    (Consider location/radiation/quality/duration/timing/severity/associated sxs/prior treatment) Patient is a 79 y.o. male presenting with constipation.  Constipation  Pt with history of CAD and ulcerative colitis reports he was started on Tramadol by PCP for back pain a few days ago. He reports constipation since that time and since yesterday unable to urinate. He reports severe suprapubic pain and urge to urinate but unable to pass any urine. He denies any fever or vomiting.    Past Medical History  Diagnosis Date  . Hypertension   . Hyperlipidemia   . Coronary artery disease     s/p ANTERIOR STEMI 1/09 at [facility]; treated with a bare-metal stent to the LAD; Myoview 11/11 EF 71%, no scar, no ischemia;     cath 12/20/10:   pLAD 30-40%, stent ok, ? If stent oversized, EF 60% and low LVEDP  . GERD (gastroesophageal reflux disease)   . Other esophagitis   . Diaphragmatic hernia without mention of obstruction or gangrene   . Ulcerative (chronic) ileocolitis   . Diverticulosis of colon (without mention of hemorrhage)   . Other chronic nonalcoholic liver disease   . Regional enteritis of small intestine   . Flatulence, eructation, and gas pain   . Ischemic cardiomyopathy     EF initially 35-40% after MI 1/09; echo 7/08: EF 60%  . Fatty liver     on ultrasound of 10/2009  . Parkinson's disease   . Gait disorder   . Memory disorder   . Osteoporosis   . Renal calculi   . Melanoma     Past Surgical History  Procedure Laterality Date  . Cardiac catheterization  09/25/06    EF 35-40% but more recently 60%  . Cholecystectomy    . Appendectomy    . Back surgery      L3, L4, L5  . Tonsillectomy    . Partial bowel resection    . Coronary artery stent placement    . Melanoma  resection    . Cataract extraction      Bilateral    Family History  Problem Relation Age of Onset  . Coronary artery disease    . Heart failure Mother   . Emphysema Father   . Heart disease Brother   . Heart disease Brother   . Colon cancer Brother     History  Substance Use Topics  . Smoking status: Never Smoker   . Smokeless tobacco: Never Used  . Alcohol Use: Yes     Comment: occ.      Review of Systems  Gastrointestinal: Positive for constipation.   All other systems reviewed and are negative except as noted in HPI.   Allergies  Review of patient's allergies indicates no known allergies.  Home Medications   Current Outpatient Rx  Name  Route  Sig  Dispense  Refill  . acetaminophen (TYLENOL) 325 MG tablet   Oral   Take 650 mg by mouth every 6 (six) hours as needed for pain.         Marland Kitchen aspirin EC 81 MG tablet   Oral   Take 81 mg by mouth daily.         Marland Kitchen atorvastatin (LIPITOR) 40 MG tablet   Oral   Take 40  mg by mouth daily.           . carbidopa-levodopa (SINEMET IR) 25-100 MG per tablet      One tablet in the morning and midday, and one half tablet at night   60 tablet   2   . carvedilol (COREG) 6.25 MG tablet   Oral   Take 6.25 mg by mouth 2 (two) times daily with a meal.         . cholecalciferol (VITAMIN D) 1000 UNITS tablet   Oral   Take 2,000 Units by mouth daily.         . cholestyramine (QUESTRAN) 4 G packet   Oral   Take 1 packet by mouth 2 (two) times daily with a meal.   60 each   11   . cyanocobalamin 1000 MCG tablet   Oral   Take 100 mcg by mouth daily.         . isosorbide mononitrate (IMDUR) 30 MG 24 hr tablet      TAKE ONE TABLET BY MOUTH ONCE DAILY   30 tablet   6   . lansoprazole (PREVACID) 30 MG capsule   Oral   Take 30 mg by mouth daily.         Marland Kitchen lisinopril (PRINIVIL,ZESTRIL) 10 MG tablet   Oral   Take 10 mg by mouth daily.           Marland Kitchen loperamide (IMODIUM) 2 MG capsule   Oral   Take 2 mg  by mouth 4 (four) times daily as needed for diarrhea or loose stools.         . nitroGLYCERIN (NITROSTAT) 0.4 MG SL tablet   Sublingual   Place 0.4 mg under the tongue every 5 (five) minutes as needed. For chest pain.         . NON FORMULARY      Carbitona-levodopa 25-100 take 1/2 tablet three times daily for Parkinson's disease         . PENTASA 250 MG CR capsule      TAKE TWO CAPSULES THREE TIMES DAILY   180 capsule   6   . Potassium Bicarbonate 99 MG CAPS   Oral   Take 1 capsule by mouth daily.         Marland Kitchen pyridOXINE (VITAMIN B-6) 100 MG tablet   Oral   Take 100 mg by mouth daily.           . Testosterone (ANDROGEL PUMP TD)   Transdermal   Place 2 application onto the skin daily. 2 pumps once daily            BP 155/93  Pulse 120  Temp(Src) 98 F (36.7 C) (Oral)  Resp 22  SpO2 97%  Physical Exam  Nursing note and vitals reviewed. Constitutional: He is oriented to person, place, and time. He appears well-developed and well-nourished.  HENT:  Head: Normocephalic and atraumatic.  Eyes: EOM are normal. Pupils are equal, round, and reactive to light.  Neck: Normal range of motion. Neck supple.  Cardiovascular: Normal rate, normal heart sounds and intact distal pulses.   Pulmonary/Chest: Effort normal and breath sounds normal.  Abdominal: Bowel sounds are normal. He exhibits mass (distended bladder). He exhibits no distension. There is tenderness (suprapubic). There is no rebound and no guarding.  Musculoskeletal: Normal range of motion. He exhibits no edema and no tenderness.  Neurological: He is alert and oriented to person, place, and time. He has normal strength. No cranial nerve  deficit or sensory deficit.  Skin: Skin is warm and dry. No rash noted.  Psychiatric: He has a normal mood and affect.    ED Course  Procedures (including critical care time)  Labs Reviewed  URINALYSIS, ROUTINE W REFLEX MICROSCOPIC   No results found.   1. Acute  urinary retention   2. Constipation due to pain medication, initial encounter       MDM  Foley placed by nurse with >1000cc urine output. Advised to avoid Tramadol. Will plan leg bag and PCP follow up. Awaiting UA.         Fayette Hamada B. Karle Starch, MD 09/27/12 1314

## 2012-09-27 NOTE — ED Notes (Signed)
Leg bag attached to urinary catheter by Genoveva Ill and Marylou Flesher RN using sterile procedure.

## 2012-09-27 NOTE — ED Notes (Addendum)
Per EMS: Pt from independent living[facility] ([facility]). Pt hurt his back a few days ago and started taking Tramadol and it makes him constipated.  Also unable to urinate x2 days. Pt stated this happens every time he takes pain medication. Pt did not try any OTC medications.  Per pt's wife: pt went to nurse at [facility] last Sunday for back ache and saw the NP at [facility] on Thursday.

## 2012-09-28 ENCOUNTER — Emergency Department (HOSPITAL_COMMUNITY): Payer: Medicare Other

## 2012-09-28 ENCOUNTER — Encounter (HOSPITAL_COMMUNITY): Payer: Self-pay

## 2012-09-28 ENCOUNTER — Inpatient Hospital Stay (HOSPITAL_COMMUNITY)
Admission: EM | Admit: 2012-09-28 | Discharge: 2012-10-02 | DRG: 389 | Disposition: A | Discharge status: Home / Self Care | Payer: Medicare Other | Attending: Internal Medicine | Admitting: Internal Medicine

## 2012-09-28 ENCOUNTER — Ambulatory Visit: Payer: Medicare Other | Admitting: Physical Therapy

## 2012-09-28 DIAGNOSIS — K50019 Crohn's disease of small intestine with unspecified complications: Secondary | ICD-10-CM

## 2012-09-28 DIAGNOSIS — G20A1 Parkinson's disease without dyskinesia, without mention of fluctuations: Secondary | ICD-10-CM | POA: Diagnosis present

## 2012-09-28 DIAGNOSIS — E46 Unspecified protein-calorie malnutrition: Secondary | ICD-10-CM | POA: Diagnosis present

## 2012-09-28 DIAGNOSIS — K565 Intestinal adhesions [bands], unspecified as to partial versus complete obstruction: Principal | ICD-10-CM | POA: Diagnosis present

## 2012-09-28 DIAGNOSIS — K56609 Unspecified intestinal obstruction, unspecified as to partial versus complete obstruction: Secondary | ICD-10-CM

## 2012-09-28 DIAGNOSIS — I2510 Atherosclerotic heart disease of native coronary artery without angina pectoris: Secondary | ICD-10-CM | POA: Diagnosis present

## 2012-09-28 DIAGNOSIS — K50018 Crohn's disease of small intestine with other complication: Secondary | ICD-10-CM

## 2012-09-28 DIAGNOSIS — R627 Adult failure to thrive: Secondary | ICD-10-CM | POA: Diagnosis present

## 2012-09-28 DIAGNOSIS — K5000 Crohn's disease of small intestine without complications: Secondary | ICD-10-CM | POA: Diagnosis present

## 2012-09-28 DIAGNOSIS — K4020 Bilateral inguinal hernia, without obstruction or gangrene, not specified as recurrent: Secondary | ICD-10-CM | POA: Diagnosis present

## 2012-09-28 DIAGNOSIS — I10 Essential (primary) hypertension: Secondary | ICD-10-CM | POA: Diagnosis present

## 2012-09-28 DIAGNOSIS — N179 Acute kidney failure, unspecified: Secondary | ICD-10-CM

## 2012-09-28 DIAGNOSIS — R5381 Other malaise: Secondary | ICD-10-CM | POA: Diagnosis present

## 2012-09-28 DIAGNOSIS — I252 Old myocardial infarction: Secondary | ICD-10-CM

## 2012-09-28 DIAGNOSIS — R5383 Other fatigue: Secondary | ICD-10-CM | POA: Diagnosis present

## 2012-09-28 DIAGNOSIS — D72829 Elevated white blood cell count, unspecified: Secondary | ICD-10-CM | POA: Diagnosis present

## 2012-09-28 DIAGNOSIS — Z9861 Coronary angioplasty status: Secondary | ICD-10-CM

## 2012-09-28 DIAGNOSIS — M545 Low back pain, unspecified: Secondary | ICD-10-CM | POA: Diagnosis present

## 2012-09-28 DIAGNOSIS — R933 Abnormal findings on diagnostic imaging of other parts of digestive tract: Secondary | ICD-10-CM

## 2012-09-28 DIAGNOSIS — K5090 Crohn's disease, unspecified, without complications: Secondary | ICD-10-CM

## 2012-09-28 DIAGNOSIS — K219 Gastro-esophageal reflux disease without esophagitis: Secondary | ICD-10-CM | POA: Diagnosis present

## 2012-09-28 DIAGNOSIS — G20 Parkinson's disease: Secondary | ICD-10-CM | POA: Diagnosis present

## 2012-09-28 DIAGNOSIS — R338 Other retention of urine: Secondary | ICD-10-CM | POA: Diagnosis present

## 2012-09-28 DIAGNOSIS — E860 Dehydration: Secondary | ICD-10-CM

## 2012-09-28 LAB — CBC WITH DIFFERENTIAL/PLATELET
Basophils Absolute: 0 10*3/uL (ref 0.0–0.1)
Basophils Relative: 0 % (ref 0–1)
Eosinophils Absolute: 0 10*3/uL (ref 0.0–0.7)
Eosinophils Relative: 0 % (ref 0–5)
HCT: 49.7 % (ref 39.0–52.0)
Hemoglobin: 17.2 g/dL — ABNORMAL HIGH (ref 13.0–17.0)
Lymphocytes Relative: 18 % (ref 12–46)
Lymphs Abs: 1.6 10*3/uL (ref 0.7–4.0)
MCH: 32.2 pg (ref 26.0–34.0)
MCHC: 34.6 g/dL (ref 30.0–36.0)
MCV: 93.1 fL (ref 78.0–100.0)
Monocytes Absolute: 1 10*3/uL (ref 0.1–1.0)
Monocytes Relative: 11 % (ref 3–12)
Neutro Abs: 6.5 10*3/uL (ref 1.7–7.7)
Neutrophils Relative %: 71 % (ref 43–77)
Platelets: 229 10*3/uL (ref 150–400)
RBC: 5.34 MIL/uL (ref 4.22–5.81)
RDW: 13.2 % (ref 11.5–15.5)
WBC: 9.1 10*3/uL (ref 4.0–10.5)

## 2012-09-28 LAB — COMPREHENSIVE METABOLIC PANEL
ALT: 15 U/L (ref 0–53)
AST: 21 U/L (ref 0–37)
Albumin: 4.1 g/dL (ref 3.5–5.2)
Alkaline Phosphatase: 79 U/L (ref 39–117)
BUN: 24 mg/dL — ABNORMAL HIGH (ref 6–23)
CO2: 28 mEq/L (ref 19–32)
Calcium: 10 mg/dL (ref 8.4–10.5)
Chloride: 93 mEq/L — ABNORMAL LOW (ref 96–112)
Creatinine, Ser: 1.79 mg/dL — ABNORMAL HIGH (ref 0.50–1.35)
GFR calc Af Amer: 39 mL/min — ABNORMAL LOW (ref >90)
GFR calc non Af Amer: 34 mL/min — ABNORMAL LOW (ref >90)
Glucose, Bld: 153 mg/dL — ABNORMAL HIGH (ref 70–99)
Potassium: 3.7 mEq/L (ref 3.5–5.1)
Sodium: 134 mEq/L — ABNORMAL LOW (ref 135–145)
Total Bilirubin: 2.6 mg/dL — ABNORMAL HIGH (ref 0.3–1.2)
Total Protein: 7.8 g/dL (ref 6.0–8.3)

## 2012-09-28 MED ORDER — ONDANSETRON HCL 4 MG PO TABS: 4.0000 mg | ORAL_TABLET | Freq: Four times a day (QID) | ORAL | Status: DC | PRN

## 2012-09-28 MED ORDER — MORPHINE SULFATE 2 MG/ML IJ SOLN
1.0000 mg | INTRAMUSCULAR | Status: DC | PRN
  Administered 2012-09-28: 1 mg via INTRAVENOUS
  Filled 2012-09-28: qty 1

## 2012-09-28 MED ORDER — ONDANSETRON HCL 4 MG/2ML IJ SOLN
4.0000 mg | Freq: Once | INTRAMUSCULAR | Status: AC
  Administered 2012-09-28: 4 mg via INTRAVENOUS
  Filled 2012-09-28: qty 2

## 2012-09-28 MED ORDER — ONDANSETRON HCL 4 MG/2ML IJ SOLN
4.0000 mg | Freq: Once | INTRAMUSCULAR | Status: AC
  Administered 2012-09-28: 4 mg via INTRAVENOUS

## 2012-09-28 MED ORDER — SODIUM CHLORIDE 0.9 % IV BOLUS (SEPSIS)
1000.0000 mL | Freq: Once | INTRAVENOUS | Status: AC
  Administered 2012-09-28: 1000 mL via INTRAVENOUS

## 2012-09-28 MED ORDER — PANTOPRAZOLE SODIUM 40 MG IV SOLR
40.0000 mg | INTRAVENOUS | Status: DC
  Administered 2012-09-28 – 2012-09-30 (×3): 40 mg via INTRAVENOUS
  Filled 2012-09-28 (×4): qty 40

## 2012-09-28 MED ORDER — SODIUM CHLORIDE 0.9 % IV SOLN
INTRAVENOUS | Status: DC
  Administered 2012-09-28 – 2012-10-01 (×5): via INTRAVENOUS

## 2012-09-28 MED ORDER — IOHEXOL 300 MG/ML  SOLN
50.0000 mL | Freq: Once | INTRAMUSCULAR | Status: AC | PRN
  Administered 2012-09-28: 50 mL via ORAL

## 2012-09-28 MED ORDER — METHYLPREDNISOLONE SODIUM SUCC 125 MG IJ SOLR
60.0000 mg | INTRAMUSCULAR | Status: DC
  Administered 2012-09-28 – 2012-09-29 (×2): 60 mg via INTRAVENOUS
  Filled 2012-09-28: qty 0.96
  Filled 2012-09-28: qty 2
  Filled 2012-09-28: qty 0.96

## 2012-09-28 MED ORDER — ONDANSETRON HCL 4 MG/2ML IJ SOLN: 4.0000 mg | Freq: Four times a day (QID) | INTRAMUSCULAR | Status: DC | PRN

## 2012-09-28 MED ORDER — LORAZEPAM 2 MG/ML IJ SOLN
1.0000 mg | Freq: Two times a day (BID) | INTRAMUSCULAR | Status: DC | PRN
  Administered 2012-09-28 – 2012-10-01 (×4): 1 mg via INTRAVENOUS
  Filled 2012-09-28 (×4): qty 1

## 2012-09-28 MED ORDER — LORAZEPAM BOLUS VIA INFUSION: 1.0000 mg | Freq: Two times a day (BID) | INTRAVENOUS | Status: DC | PRN

## 2012-09-28 NOTE — ED Notes (Signed)
Per EMS- Patient was seen at [facility] ED for constipation and was discharged. Patient took laxatives last night and today c/o diarrhea, nausea, and weakness today.

## 2012-09-28 NOTE — Progress Notes (Signed)
Utilization Review completed.  Donovyn Guidice RN CM  

## 2012-09-28 NOTE — ED Notes (Signed)
NGX:[id number]<RH> Expected date:<BR> Expected time:<BR> Means of arrival:Ambulance<BR> Comments:<BR> 85yoF,abd pain, n/v/

## 2012-09-28 NOTE — H&P (Signed)
[name] [name] is an 79 y.o. male.   PCP:   [name], MD   Chief Complaint:  SBO  HPI: 60 M c H/O CAD, Crohn's, and multiple med issues.  This is his 2nd SBO 05/2011.  He was seen in ED with Ab pain and difficulty Voiding.  Foley was placed and he had @ 1 L UOP.  He did poorly at home w ith some N/V.  He had Mult BMs.  He presented to med attention this am with weakness, FTT and pains.  AAS confirmed markedly dilated fluid and air filled loops of small intestine consistent with small bowel obstruction. CT obtained and showed Small bowel obstruction with apparent change in caliber within the anterior right lower quadrant - right upper pelvis most likely due to an adhesion.  No bowel wall edema is seen.  I was called to admit.  Surgery was called to consult. His GI Doc is Dr Ardis Hughs.  NGT is in and he is feeling better.  Less pain.  less Bloat.  Like usual he remains in better spirits than he ought to be in. Vomitous noted on shirt.    Past Medical History:  Past Medical History  Diagnosis Date  . Hypertension   . Hyperlipidemia   . Coronary artery disease     s/p ANTERIOR STEMI 1/09 at [place]; treated with a bare-metal stent to the LAD; Myoview 11/11 EF 71%, no scar, no ischemia;     cath 12/20/10:   pLAD 30-40%, stent ok, ? If stent oversized, EF 60% and low LVEDP  . GERD (gastroesophageal reflux disease)   . Other esophagitis   . Diaphragmatic hernia without mention of obstruction or gangrene   . Ulcerative (chronic) ileocolitis   . Diverticulosis of colon (without mention of hemorrhage)   . Other chronic nonalcoholic liver disease   . Regional enteritis of small intestine   . Flatulence, eructation, and gas pain   . Ischemic cardiomyopathy     EF initially 35-40% after MI 1/09; echo 7/08: EF 60%  . Fatty liver     on ultrasound of 10/2009  . Parkinson's disease   . Gait disorder   . Memory disorder   . Osteoporosis   . Renal calculi   . Melanoma     Past Surgical History   Procedure Laterality Date  . Cardiac catheterization  09/25/06    EF 35-40% but more recently 60%  . Cholecystectomy    . Appendectomy    . Back surgery      L3, L4, L5  . Tonsillectomy    . Partial bowel resection    . Coronary artery stent placement    . Melanoma resection    . Cataract extraction      Bilateral      Allergies:  No Known Allergies   Medications: Prior to Admission medications   Medication Sig Start Date End Date Taking? Authorizing Provider  acetaminophen (TYLENOL) 325 MG tablet Take 650 mg by mouth every 6 (six) hours as needed for pain.   Yes Historical Provider, MD  aspirin EC 81 MG tablet Take 81 mg by mouth daily.   Yes Historical Provider, MD  atorvastatin (LIPITOR) 40 MG tablet Take 40 mg by mouth daily.     Yes Historical Provider, MD  carbidopa-levodopa (SINEMET IR) 25-100 MG per tablet One tablet in the morning and midday, and one half tablet at night 08/20/12  Yes [name], MD  carvedilol (COREG) 6.25 MG tablet  Take 6.25 mg by mouth 2 (two) times daily with a meal.   Yes Historical Provider, MD  cholecalciferol (VITAMIN D) 1000 UNITS tablet Take 2,000 Units by mouth daily.   Yes Historical Provider, MD  cholestyramine Lucrezia Starch) 4 G packet Take 1 packet by mouth 2 (two) times daily with a meal. 02/04/12 02/03/13 Yes [name], MD  cyanocobalamin 1000 MCG tablet Take 100 mcg by mouth daily.   Yes Historical Provider, MD  isosorbide mononitrate (IMDUR) 30 MG 24 hr tablet TAKE ONE TABLET BY MOUTH ONCE DAILY 08/12/12  Yes Jolaine Artist, MD  lansoprazole (PREVACID) 30 MG capsule Take 30 mg by mouth daily.   Yes Historical Provider, MD  PENTASA 250 MG CR capsule TAKE TWO CAPSULES THREE TIMES DAILY 03/09/12  Yes [name], MD  Potassium Bicarbonate 99 MG CAPS Take 1 capsule by mouth daily.   Yes Historical Provider, MD  pyridOXINE (VITAMIN B-6) 100 MG tablet Take 100 mg by mouth daily.     Yes Historical Provider, MD  Testosterone  (ANDROGEL PUMP TD) Place 2 application onto the skin daily. 2 pumps once daily    Yes Historical Provider, MD  nitroGLYCERIN (NITROSTAT) 0.4 MG SL tablet Place 0.4 mg under the tongue every 5 (five) minutes as needed. For chest pain.    Historical Provider, MD      (Not in a hospital admission)   Social History:  reports that he has never smoked. He has never used smokeless tobacco. He reports that  drinks alcohol. He reports that he does not use illicit drugs.  Family History: Family History  Problem Relation Age of Onset  . Coronary artery disease    . Heart failure Mother   . Emphysema Father   . Heart disease Brother   . Heart disease Brother   . Colon cancer Brother     Review of Systems:  Review of Systems - N/V/Ab pain/and GI issues. Mild Wheeze. Fatigue. U Retention. Weak. Full ROS obtained.   Physical Exam:  Blood pressure 116/69, pulse 115, temperature 97.8 F (36.6 C), temperature source Oral, resp. rate 16, height 5' 9.5" (1.765 m), weight 79.379 kg (175 lb), SpO2 94.00%. Filed Vitals:   09/28/12 1222 09/28/12 1243  BP:  116/69  Pulse:  115  Temp:  97.8 F (36.6 C)  TempSrc:  Oral  Resp:  16  Height:  5' 9.5" (1.765 m)  Weight:  79.379 kg (175 lb)  SpO2: 98% 94%   General appearance: Alert and O - NAD. Head: Normocephalic, without obvious abnormality, atraumatic Eyes: conjunctivae/corneas clear. PERRL, EOM's intact.  Nose: Nares normal. Septum midline. Mucosa normal. No drainage or sinus tenderness.  NGT in place Throat: lips, mucosa, and tongue normal; teeth and gums normal OP - Dry Neck: no adenopathy, no carotid bruit, no JVD and thyroid not enlarged, symmetric, no tenderness/mass/nodules Resp: CTA Cardio: Reg 1/6 SEm GI: Bloated/Distended - surg scars. Extremities: extremities normal, atraumatic, no cyanosis or edema Pulses: 2+ and symmetric Lymph nodes: no cervical lymphadenopathy Neurologic: Alert and oriented X 3, normal strength and  tone. Normal symmetric reflexes.     Labs on Admission:   Recent Labs  09/28/12 1245  NA 134*  K 3.7  CL 93*  CO2 28  GLUCOSE 153*  BUN 24*  CREATININE 1.79*  CALCIUM 10.0    Recent Labs  09/28/12 1245  AST 21  ALT 15  ALKPHOS 79  BILITOT 2.6*  PROT 7.8  ALBUMIN 4.1   No  results found for this basename: LIPASE, AMYLASE,  in the last 72 hours  Recent Labs  09/28/12 1245  WBC 9.1  NEUTROABS 6.5  HGB 17.2*  HCT 49.7  MCV 93.1  PLT 229   No results found for this basename: CKTOTAL, CKMB, CKMBINDEX, TROPONINI,  in the last 72 hours Lab Results  Component Value Date   INR 1.0 12/14/2010     LAB RESULT POCT:  Results for orders placed during the hospital encounter of 09/28/12  CBC WITH DIFFERENTIAL      Result Value Range   WBC 9.1  4.0 - 10.5 K/uL   RBC 5.34  4.22 - 5.81 MIL/uL   Hemoglobin 17.2 (*) 13.0 - 17.0 g/dL   HCT 49.7  39.0 - 52.0 %   MCV 93.1  78.0 - 100.0 fL   MCH 32.2  26.0 - 34.0 pg   MCHC 34.6  30.0 - 36.0 g/dL   RDW 13.2  11.5 - 15.5 %   Platelets 229  150 - 400 K/uL   Neutrophils Relative % 71  43 - 77 %   Neutro Abs 6.5  1.7 - 7.7 K/uL   Lymphocytes Relative 18  12 - 46 %   Lymphs Abs 1.6  0.7 - 4.0 K/uL   Monocytes Relative 11  3 - 12 %   Monocytes Absolute 1.0  0.1 - 1.0 K/uL   Eosinophils Relative 0  0 - 5 %   Eosinophils Absolute 0.0  0.0 - 0.7 K/uL   Basophils Relative 0  0 - 1 %   Basophils Absolute 0.0  0.0 - 0.1 K/uL  COMPREHENSIVE METABOLIC PANEL      Result Value Range   Sodium 134 (*) 135 - 145 mEq/L   Potassium 3.7  3.5 - 5.1 mEq/L   Chloride 93 (*) 96 - 112 mEq/L   CO2 28  19 - 32 mEq/L   Glucose, Bld 153 (*) 70 - 99 mg/dL   BUN 24 (*) 6 - 23 mg/dL   Creatinine, Ser 1.79 (*) 0.50 - 1.35 mg/dL   Calcium 10.0  8.4 - 10.5 mg/dL   Total Protein 7.8  6.0 - 8.3 g/dL   Albumin 4.1  3.5 - 5.2 g/dL   AST 21  0 - 37 U/L   ALT 15  0 - 53 U/L   Alkaline Phosphatase 79  39 - 117 U/L   Total Bilirubin 2.6 (*) 0.3 - 1.2  mg/dL   GFR calc non Af Amer 34 (*) >90 mL/min   GFR calc Af Amer 39 (*) >90 mL/min      Radiological Exams on Admission: Ct Abdomen Pelvis Wo Contrast  09/28/2012   *RADIOLOGY REPORT*  Clinical Data: Nausea, history of prior bowel resection and kidney stones  CT ABDOMEN AND PELVIS WITHOUT CONTRAST  Technique:  Multidetector CT imaging of the abdomen and pelvis was performed following the standard protocol without intravenous contrast.  Comparison: Abdomen plain films of 09/28/2012 and CT abdomen pelvis of 06/02/2011  Findings: The lung bases are clear.  There is some fluid within a slightly distended distal esophagus probably due to reflux.  The liver is unremarkable in the unenhanced state.  Surgical clips are present from prior cholecystectomy.  The pancreas appears relatively atrophic.  The adrenal glands and spleen are unremarkable.  The stomach is fluid distended with probable food debris layering near the fundus.  No hydronephrosis is seen.  There do appear to be a few small nonobstructing calculi within the  upper pole of the left kidney.  The abdominal aorta is normal in caliber. No adenopathy is noted.  However, there are dilated loops of small bowel throughout the abdomen pelvis.  The distal ileum appears decompressed, with an apparent change in caliber where there is kinking of small bowel within the right lower quadrant - right mid pelvis anteriorly.  No mass is seen and this kinking and abrupt change in caliber would suggest the presence of adhesions.  No free fluid is seen within the pelvis.  The prostate is within normal limits in size for age.  A Foley catheter is within the urinary bladder.  There are multiple rectosigmoid and descending colon diverticula present.  It appears that the patient has undergone prior right hemicolectomy and the anastomosis in the right abdomen is unremarkable.  Bilateral inguinal hernias are present containing fat. Hardware for posterior fusion from L3-S1 is  noted.  There appears to be solid fusion at L3- 4 and L5- S1 with ray cage fusion at L4-5.  IMPRESSION:  1.  Small bowel obstruction with apparent change in caliber within the anterior right lower quadrant - right upper pelvis most likely due to an adhesion.  No bowel wall edema is seen. 2.  Small nonobstructing left upper pole renal calculi. 3.  Some fluid in the slightly distended distal esophagus may indicate reflux. 4.  Rectosigmoid and descending colon diverticula. 5.  Bilateral inguinal hernias containing fat.   Original Report Authenticated By: Ivar Drape, M.D.   Dg Abd Acute W/chest  09/28/2012   *RADIOLOGY REPORT*  Clinical Data: Abdominal pain.  Nausea and vomiting.  Previous surgery.  ACUTE ABDOMEN SERIES (ABDOMEN 2 VIEW & CHEST 1 VIEW)  Comparison: 06/04/2011  Findings: There are markedly dilated fluid and air filled loops of small intestine consistent with small bowel obstruction.  No free air.  Previous spinal surgery is noted.  Previous cholecystectomy.  One-view chest shows normal heart and mediastinal shadows.  There is mild atelectasis at the left base.  No free air.  IMPRESSION: Markedly dilated fluid and air filled loops of small intestine consistent with small bowel obstruction.   Original Report Authenticated By: Nelson Chimes, M.D.      Orders placed during the hospital encounter of 09/28/12  . ED EKG  . ED EKG  . EKG 12-LEAD  . EKG 12-LEAD     Assessment/Plan Active Problems:   HYPERTENSION   CAD   CROHN'S DISEASE-SMALL INTESTINE   SBO (small bowel obstruction)   Dehydration   Acute kidney injury  Recurrent SBO - due to Adhesions, known stricture at anastomosis due to prior surgery, small hernia related versus Active Crohn's flare.  NPO, Bowel Rest, NGT, Fluids, Steroids for possible Crohns flare. Pain and nausea Rx ordered  DeH c increased Cr and hemoconcentrated- Fluids.  Follow Cr  U Retention - Foley.  flomax when tolerating POs.  LBP - may have been early SBO  or the Tramadol/Muscle relaxors promoted Constipation triggering the ileus?  CAD- status post anterior ST elevation myocardial infarction treated with bare-metal stent to the LAD at [facility] in [place] in 1/09. Last cath by Dr. Johnsie Cancel on 9/13: pLAD 30-40%, stent ok, ? If stent oversized, EF 60% and low LVEDP. Medical therapy continued.  Ischmeic CM c EF 35-40% improved to 60% with med management. Per Dr Haroldine Laws   Hypertension - BP Ok,   Hyperlipidemia - on Outpt Rx,   Crohn's disease c h/o Distant ileal and right colon resection on chronic [place].  Dr Oretha Caprice.  On Cholestyramin for stool bulk.  GERD - PPI IV until taking POs.    DVT Prophylaxis - TED Hose/Squeezers.  PD - Dr Linus Mako. Sinemet.  Watch for Sxs with stopping PD Drugs suddenly.   Prospero Mahnke M 09/28/2012, 5:56 PM

## 2012-09-28 NOTE — Consult Note (Signed)
[name] [name] [birth year]  [id number].   Primary Care MD: Dr. Shon Baton Requesting MD: Dr. Blanchie Dessert Chief Complaint/Reason for Consult: SBO HPI: This is a very pleasant 79 yo male who has a h/o crohn's disease who underwent an ileocecectomy many years ago for nonresolving flare with SBO.  Since that time, he has only had 2 other flares, both with other major incidents such as his MI.  He takes Pentassa for this daily.    He started feeling bad with back pain about a week ago.  Last Thursday he was seen and given pain medication for his back.  He normally had diarrhea, but this constipated him to the point that he didn't have a BM for 2 days.  He then began having difficulty urinating.  He presented to [facility] yesterday with abdominal pain and inability to urinate for 24hrs.  A foley catheter was placed and he was sent home and told to take Miralax.  He took this and ended up having 5 large volume stools.  Since then he has remained nauseous and having emesis.  His abdomen is bloated and painful.  He presents today to [facility] where he has an AAS that reveals a SBO.  CT scan was obtained which reveals a SBO.  We were consulted.  Review of Systems: Please see HPI, otherwise all other systems are currently negative.  Family History  Problem Relation Age of Onset  . Coronary artery disease    . Heart failure Mother   . Emphysema Father   . Heart disease Brother   . Heart disease Brother   . Colon cancer Brother     Past Medical History  Diagnosis Date  . Hypertension   . Hyperlipidemia   . Coronary artery disease     s/p ANTERIOR STEMI 1/09 at [place]; treated with a bare-metal stent to the LAD; Myoview 11/11 EF 71%, no scar, no ischemia;     cath 12/20/10:   pLAD 30-40%, stent ok, ? If stent oversized, EF 60% and low LVEDP  . GERD (gastroesophageal reflux disease)   . Other esophagitis   . Diaphragmatic hernia without mention of obstruction or gangrene   . Ulcerative (chronic)  ileocolitis   . Diverticulosis of colon (without mention of hemorrhage)   . Other chronic nonalcoholic liver disease   . Regional enteritis of small intestine   . Flatulence, eructation, and gas pain   . Ischemic cardiomyopathy     EF initially 35-40% after MI 1/09; echo 7/08: EF 60%  . Fatty liver     on ultrasound of 10/2009  . Parkinson's disease   . Gait disorder   . Memory disorder   . Osteoporosis   . Renal calculi   . Melanoma     Past Surgical History  Procedure Laterality Date  . Cardiac catheterization  09/25/06    EF 35-40% but more recently 60%  . Cholecystectomy    . Appendectomy    . Back surgery      L3, L4, L5  . Tonsillectomy    . Partial bowel resection    . Coronary artery stent placement    . Melanoma resection    . Cataract extraction      Bilateral    Social History:  reports that he has never smoked. He has never used smokeless tobacco. He reports that  drinks alcohol. He reports that he does not use illicit drugs.  Allergies: No Known Allergies  Medications Prior to Admission  Medication  Sig Dispense Refill  . acetaminophen (TYLENOL) 325 MG tablet Take 650 mg by mouth every 6 (six) hours as needed for pain.      Marland Kitchen aspirin EC 81 MG tablet Take 81 mg by mouth daily.      Marland Kitchen atorvastatin (LIPITOR) 40 MG tablet Take 40 mg by mouth daily.        . carbidopa-levodopa (SINEMET IR) 25-100 MG per tablet One tablet in the morning and midday, and one half tablet at night  60 tablet  2  . carvedilol (COREG) 6.25 MG tablet Take 6.25 mg by mouth 2 (two) times daily with a meal.      . cholecalciferol (VITAMIN D) 1000 UNITS tablet Take 2,000 Units by mouth daily.      . cholestyramine (QUESTRAN) 4 G packet Take 1 packet by mouth 2 (two) times daily with a meal.  60 each  11  . cyanocobalamin 1000 MCG tablet Take 100 mcg by mouth daily.      . isosorbide mononitrate (IMDUR) 30 MG 24 hr tablet TAKE ONE TABLET BY MOUTH ONCE DAILY  30 tablet  6  . lansoprazole  (PREVACID) 30 MG capsule Take 30 mg by mouth daily.      Marland Kitchen PENTASA 250 MG CR capsule TAKE TWO CAPSULES THREE TIMES DAILY  180 capsule  6  . Potassium Bicarbonate 99 MG CAPS Take 1 capsule by mouth daily.      Marland Kitchen pyridOXINE (VITAMIN B-6) 100 MG tablet Take 100 mg by mouth daily.        . Testosterone (ANDROGEL PUMP TD) Place 2 application onto the skin daily. 2 pumps once daily       . nitroGLYCERIN (NITROSTAT) 0.4 MG SL tablet Place 0.4 mg under the tongue every 5 (five) minutes as needed. For chest pain.        Blood pressure 148/80, pulse 117, temperature 97.8 F (36.6 C), temperature source Oral, resp. rate 18, height 5' 9.5" (1.765 m), weight 175 lb (79.379 kg), SpO2 92.00%. Physical Exam: General: pleasant, WD, WN white male who is laying in bed in NAD, but with hiccups.  HEENT: head is normocephalic, atraumatic.  Sclera are noninjected.  PERRL.  Ears and nose without any masses or lesions.  Mouth is pink and dry. Heart: regular, rate, and rhythm.  Normal s1,s2. No obvious murmurs, gallops, or rubs noted.  Palpable radial and pedal pulses bilaterally Lungs: CTAB, no wheezes, rhonchi, or rales noted.  Respiratory effort nonlabored Abd: soft, diffuse tenderness, distended and tympanetic, +BS, no masses or organomegaly.  No rebounding, guarding, or peritoneal signs MS: all 4 extremities are symmetrical with no cyanosis, clubbing, or edema. Skin: warm and dry with no masses, lesions, or rashes Psych: A&Ox3 with an appropriate affect.    Results for orders placed during the hospital encounter of 09/28/12 (from the past 48 hour(s))  CBC WITH DIFFERENTIAL     Status: Abnormal   Collection Time    09/28/12 12:45 PM      Result Value Range   WBC 9.1  4.0 - 10.5 K/uL   RBC 5.34  4.22 - 5.81 MIL/uL   Hemoglobin 17.2 (*) 13.0 - 17.0 g/dL   HCT 49.7  39.0 - 52.0 %   MCV 93.1  78.0 - 100.0 fL   MCH 32.2  26.0 - 34.0 pg   MCHC 34.6  30.0 - 36.0 g/dL   RDW 13.2  11.5 - 15.5 %   Platelets 229   150 - 400 K/uL  Neutrophils Relative % 71  43 - 77 %   Neutro Abs 6.5  1.7 - 7.7 K/uL   Lymphocytes Relative 18  12 - 46 %   Lymphs Abs 1.6  0.7 - 4.0 K/uL   Monocytes Relative 11  3 - 12 %   Monocytes Absolute 1.0  0.1 - 1.0 K/uL   Eosinophils Relative 0  0 - 5 %   Eosinophils Absolute 0.0  0.0 - 0.7 K/uL   Basophils Relative 0  0 - 1 %   Basophils Absolute 0.0  0.0 - 0.1 K/uL  COMPREHENSIVE METABOLIC PANEL     Status: Abnormal   Collection Time    09/28/12 12:45 PM      Result Value Range   Sodium 134 (*) 135 - 145 mEq/L   Potassium 3.7  3.5 - 5.1 mEq/L   Chloride 93 (*) 96 - 112 mEq/L   CO2 28  19 - 32 mEq/L   Glucose, Bld 153 (*) 70 - 99 mg/dL   BUN 24 (*) 6 - 23 mg/dL   Creatinine, Ser 1.79 (*) 0.50 - 1.35 mg/dL   Calcium 10.0  8.4 - 10.5 mg/dL   Total Protein 7.8  6.0 - 8.3 g/dL   Albumin 4.1  3.5 - 5.2 g/dL   AST 21  0 - 37 U/L   ALT 15  0 - 53 U/L   Alkaline Phosphatase 79  39 - 117 U/L   Total Bilirubin 2.6 (*) 0.3 - 1.2 mg/dL   GFR calc non Af Amer 34 (*) >90 mL/min   GFR calc Af Amer 39 (*) >90 mL/min   Comment:            The eGFR has been calculated     using the CKD EPI equation.     This calculation has not been     validated in all clinical     situations.     eGFR's persistently     <90 mL/min signify     possible Chronic Kidney Disease.   Ct Abdomen Pelvis Wo Contrast  09/28/2012   *RADIOLOGY REPORT*  Clinical Data: Nausea, history of prior bowel resection and kidney stones  CT ABDOMEN AND PELVIS WITHOUT CONTRAST  Technique:  Multidetector CT imaging of the abdomen and pelvis was performed following the standard protocol without intravenous contrast.  Comparison: Abdomen plain films of 09/28/2012 and CT abdomen pelvis of 06/02/2011  Findings: The lung bases are clear.  There is some fluid within a slightly distended distal esophagus probably due to reflux.  The liver is unremarkable in the unenhanced state.  Surgical clips are present from prior  cholecystectomy.  The pancreas appears relatively atrophic.  The adrenal glands and spleen are unremarkable.  The stomach is fluid distended with probable food debris layering near the fundus.  No hydronephrosis is seen.  There do appear to be a few small nonobstructing calculi within the upper pole of the left kidney.  The abdominal aorta is normal in caliber. No adenopathy is noted.  However, there are dilated loops of small bowel throughout the abdomen pelvis.  The distal ileum appears decompressed, with an apparent change in caliber where there is kinking of small bowel within the right lower quadrant - right mid pelvis anteriorly.  No mass is seen and this kinking and abrupt change in caliber would suggest the presence of adhesions.  No free fluid is seen within the pelvis.  The prostate is within normal limits in size for age.  A Foley catheter is within the urinary bladder.  There are multiple rectosigmoid and descending colon diverticula present.  It appears that the patient has undergone prior right hemicolectomy and the anastomosis in the right abdomen is unremarkable.  Bilateral inguinal hernias are present containing fat. Hardware for posterior fusion from L3-S1 is noted.  There appears to be solid fusion at L3- 4 and L5- S1 with ray cage fusion at L4-5.  IMPRESSION:  1.  Small bowel obstruction with apparent change in caliber within the anterior right lower quadrant - right upper pelvis most likely due to an adhesion.  No bowel wall edema is seen. 2.  Small nonobstructing left upper pole renal calculi. 3.  Some fluid in the slightly distended distal esophagus may indicate reflux. 4.  Rectosigmoid and descending colon diverticula. 5.  Bilateral inguinal hernias containing fat.   Original Report Authenticated By: Ivar Drape, M.D.   Dg Abd Acute W/chest  09/28/2012   *RADIOLOGY REPORT*  Clinical Data: Abdominal pain.  Nausea and vomiting.  Previous surgery.  ACUTE ABDOMEN SERIES (ABDOMEN 2 VIEW & CHEST  1 VIEW)  Comparison: 06/04/2011  Findings: There are markedly dilated fluid and air filled loops of small intestine consistent with small bowel obstruction.  No free air.  Previous spinal surgery is noted.  Previous cholecystectomy.  One-view chest shows normal heart and mediastinal shadows.  There is mild atelectasis at the left base.  No free air.  IMPRESSION: Markedly dilated fluid and air filled loops of small intestine consistent with small bowel obstruction.   Original Report Authenticated By: Nelson Chimes, M.D.       Assessment/Plan 1. SBO, ? Secondary to crohn's disease vs adhesive disease Patient Active Problem List   Diagnosis Date Noted  . Dehydration 09/28/2012  . Acute kidney injury 09/28/2012  . Memory loss 08/19/2012  . Paralysis agitans 08/19/2012  . Abnormality of gait 08/19/2012  . SBO (small bowel obstruction) 06/04/2011  . Dysphagia 05/24/2011  . Tinea corporis 01/04/2011  . Angina of effort 12/14/2010  . Fatigue 12/14/2010  . NOSEBLEED 05/23/2010  . ABNORMAL FINDINGS GI TRACT 10/20/2009  . NONSPECIFIC ABNORMAL RESULTS LIVR FUNCTION STUDY 10/20/2009  . CROHN'S [facility] INTESTINE 03/16/2008  . HYPERLIPIDEMIA 04/29/2007  . HYPERTENSION 04/29/2007  . CAD 04/29/2007  . GERD 04/29/2007  . CROHN'S DISEASE 04/29/2007  . SMALL BOWEL OBSTRUCTION 04/29/2007  . DIVERTICULOSIS, COLON 04/29/2007  . FATTY LIVER DISEASE 08/30/2006  . HEMORRHOIDS 03/26/2005  . ULCERATIVE ILEOCOLITIS 12/11/1999  . EROSIVE ESOPHAGITIS 09/29/1997  . HIATAL HERNIA 09/29/1997   Plan: 1. The CT scan does not show any significant inflammatory changes c/w a SBO related to crohn's disease; however, it is still a possibility so we agree with steroids just in case. Dr. Keane Police note states that the patient has a stricture as his anastomosis, but the CT scan doesn't mention his transition point being here currently.  It could be related to adhesive disease from prior open operations.  He is currently  still having a lot of emesis and is distended.  We have written for an NGT to be placed for decompression and conservative management.  I have discussed this with the patient and his wife.  They understand and agree with this initial approach.  We will check some films in the morning and follow clinically.  If he does not improve with conservative management, he may require surgically intervention.  Dalaney Needle E 09/28/2012, 7:54 PM Pager: [phone]

## 2012-09-28 NOTE — ED Provider Notes (Addendum)
History     CSN: [id number]  Arrival date & time 09/28/12  1212   First MD Initiated Contact with Patient 09/28/12 1236      Chief Complaint  Patient presents with  . Weakness  . Nausea    (Consider location/radiation/quality/duration/timing/severity/associated sxs/prior treatment) HPI Comments: Patient seen at [facility] emergency room yesterday for constipation and urinary retention after taking tramadol and muscle relaxer for back pain last week. Foley catheter was placed with good normal he may and he was instructed to take MiraLax. Patient took one capful of MiraLax yesterday and states he's had 6-7 voluminous diarrhea stools since. He states his abdomen feels tight with mild pain he has had one episode of vomiting with persistent nausea. Also complaining of diffuse weakness and just feeling tired. He states he has a history of Crohn's disease but no recent surgeries and no history of bowel obstructions. He denies any blood in his vomit or stool. He states he's only had one episode of vomiting this morning  Patient is a 80 y.o. male presenting with weakness. The history is provided by the patient.  Weakness This is a new problem. The current episode started 6 to 12 hours ago. The problem occurs constantly. The problem has not changed since onset.Associated symptoms include abdominal pain. Pertinent negatives include no shortness of breath. Nothing aggravates the symptoms. Nothing relieves the symptoms. He has tried rest and water for the symptoms. The treatment provided no relief.    Past Medical History  Diagnosis Date  . Hypertension   . Hyperlipidemia   . Coronary artery disease     s/p ANTERIOR STEMI 1/09 at [place]; treated with a bare-metal stent to the LAD; Myoview 11/11 EF 71%, no scar, no ischemia;     cath 12/20/10:   pLAD 30-40%, stent ok, ? If stent oversized, EF 60% and low LVEDP  . GERD (gastroesophageal reflux disease)   . Other esophagitis   . Diaphragmatic hernia  without mention of obstruction or gangrene   . Ulcerative (chronic) ileocolitis   . Diverticulosis of colon (without mention of hemorrhage)   . Other chronic nonalcoholic liver disease   . Regional enteritis of small intestine   . Flatulence, eructation, and gas pain   . Ischemic cardiomyopathy     EF initially 35-40% after MI 1/09; echo 7/08: EF 60%  . Fatty liver     on ultrasound of 10/2009  . Parkinson's disease   . Gait disorder   . Memory disorder   . Osteoporosis   . Renal calculi   . Melanoma     Past Surgical History  Procedure Laterality Date  . Cardiac catheterization  09/25/06    EF 35-40% but more recently 60%  . Cholecystectomy    . Appendectomy    . Back surgery      L3, L4, L5  . Tonsillectomy    . Partial bowel resection    . Coronary artery stent placement    . Melanoma resection    . Cataract extraction      Bilateral    Family History  Problem Relation Age of Onset  . Coronary artery disease    . Heart failure Mother   . Emphysema Father   . Heart disease Brother   . Heart disease Brother   . Colon cancer Brother     History  Substance Use Topics  . Smoking status: Never Smoker   . Smokeless tobacco: Never Used  . Alcohol Use: Yes  Comment: occ.      Review of Systems  Constitutional: Negative for fever.  Respiratory: Negative for cough and shortness of breath.   Gastrointestinal: Positive for nausea, vomiting, abdominal pain and diarrhea.  Neurological: Positive for weakness.  All other systems reviewed and are negative.    Allergies  Review of patient's allergies indicates no known allergies.  Home Medications   Current Outpatient Rx  Name  Route  Sig  Dispense  Refill  . acetaminophen (TYLENOL) 325 MG tablet   Oral   Take 650 mg by mouth every 6 (six) hours as needed for pain.         Marland Kitchen aspirin EC 81 MG tablet   Oral   Take 81 mg by mouth daily.         Marland Kitchen atorvastatin (LIPITOR) 40 MG tablet   Oral   Take 40 mg  by mouth daily.           . carbidopa-levodopa (SINEMET IR) 25-100 MG per tablet      One tablet in the morning and midday, and one half tablet at night   60 tablet   2   . carvedilol (COREG) 6.25 MG tablet   Oral   Take 6.25 mg by mouth 2 (two) times daily with a meal.         . cholecalciferol (VITAMIN D) 1000 UNITS tablet   Oral   Take 2,000 Units by mouth daily.         . cholestyramine (QUESTRAN) 4 G packet   Oral   Take 1 packet by mouth 2 (two) times daily with a meal.   60 each   11   . cyanocobalamin 1000 MCG tablet   Oral   Take 100 mcg by mouth daily.         . isosorbide mononitrate (IMDUR) 30 MG 24 hr tablet      TAKE ONE TABLET BY MOUTH ONCE DAILY   30 tablet   6   . lansoprazole (PREVACID) 30 MG capsule   Oral   Take 30 mg by mouth daily.         Marland Kitchen PENTASA 250 MG CR capsule      TAKE TWO CAPSULES THREE TIMES DAILY   180 capsule   6   . Potassium Bicarbonate 99 MG CAPS   Oral   Take 1 capsule by mouth daily.         Marland Kitchen pyridOXINE (VITAMIN B-6) 100 MG tablet   Oral   Take 100 mg by mouth daily.           . Testosterone (ANDROGEL PUMP TD)   Transdermal   Place 2 application onto the skin daily. 2 pumps once daily          . nitroGLYCERIN (NITROSTAT) 0.4 MG SL tablet   Sublingual   Place 0.4 mg under the tongue every 5 (five) minutes as needed. For chest pain.           BP 116/69  Pulse 115  Temp(Src) 97.8 F (36.6 C) (Oral)  Resp 16  Ht 5' 9.5" (1.765 m)  Wt 175 lb (79.379 kg)  BMI 25.48 kg/m2  SpO2 94%  Physical Exam  Nursing note and vitals reviewed. Constitutional: He is oriented to person, place, and time. He appears well-developed and well-nourished. No distress.  HENT:  Head: Normocephalic and atraumatic.  Mouth/Throat: Oropharynx is clear and moist. Mucous membranes are dry.  Eyes: Conjunctivae and EOM are normal. Pupils are  equal, round, and reactive to light.  Neck: Normal range of motion. Neck supple.   Cardiovascular: Regular rhythm and intact distal pulses.  Tachycardia present.   No murmur heard. Pulmonary/Chest: Effort normal and breath sounds normal. No respiratory distress. He has no wheezes. He has no rales.  Abdominal: Soft. He exhibits distension. Bowel sounds are decreased. There is generalized tenderness. There is no rebound, no guarding and no CVA tenderness.  Mild diffuse abd tenderness  Genitourinary:  Foley cath present with straw colored urine  Musculoskeletal: Normal range of motion. He exhibits no edema and no tenderness.  Neurological: He is alert and oriented to person, place, and time.  Skin: Skin is warm and dry. No rash noted. No erythema.  Psychiatric: He has a normal mood and affect. His behavior is normal.    ED Course  Procedures (including critical care time)  Labs Reviewed  CBC WITH DIFFERENTIAL - Abnormal; Notable for the following:    Hemoglobin 17.2 (*)    All other components within normal limits  COMPREHENSIVE METABOLIC PANEL - Abnormal; Notable for the following:    Sodium 134 (*)    Chloride 93 (*)    Glucose, Bld 153 (*)    BUN 24 (*)    Creatinine, Ser 1.79 (*)    Total Bilirubin 2.6 (*)    GFR calc non Af Amer 34 (*)    GFR calc Af Amer 39 (*)    All other components within normal limits   Dg Abd Acute W/chest  09/28/2012   *RADIOLOGY REPORT*  Clinical Data: Abdominal pain.  Nausea and vomiting.  Previous surgery.  ACUTE ABDOMEN SERIES (ABDOMEN 2 VIEW & CHEST 1 VIEW)  Comparison: 06/04/2011  Findings: There are markedly dilated fluid and air filled loops of small intestine consistent with small bowel obstruction.  No free air.  Previous spinal surgery is noted.  Previous cholecystectomy.  One-view chest shows normal heart and mediastinal shadows.  There is mild atelectasis at the left base.  No free air.  IMPRESSION: Markedly dilated fluid and air filled loops of small intestine consistent with small bowel obstruction.   Original Report  Authenticated By: Nelson Chimes, M.D.     No diagnosis found.    MDM    patient seen yesterday with constipation and urinary retention. He took MiraLax and had 6-7 large voluminous stools last night. Foley catheter is still in place he states he vomited once today. However now he feels weak and dehydrated. He has mild abdominal tenderness diffusely decreased bowel sounds. He has a history of colitis but no bowel obstruction however status post resection and 79.  He denies any blood in his stool or vomitus. Patient is tachycardic here to 110-118.  Patient symptoms could just be from dehydration as he is hemoconcentrated with a hemoglobin of 17. CMP pending. Patient's UA done yesterday showed no signs or infection. Patient's constipation resulted after taking tramadol and muscle relaxer for back pain. He did not display peritoneal signs however will do an acute abdominal series to rule out obstruction.  Patient given IV fluids and Zofran.  3:10 PM Plain film with evidence of possible obstruction.  CT ordered to further eval.  Surgery consulted and they recommended medicine admission due to other medical problems and possibly will need GI consult.        Blanchie Dessert, MD 09/28/12 [place], MD 09/28/12 1511  Blanchie Dessert, MD 09/28/12 [id number]

## 2012-09-29 ENCOUNTER — Inpatient Hospital Stay (HOSPITAL_COMMUNITY): Payer: Medicare Other

## 2012-09-29 DIAGNOSIS — R933 Abnormal findings on diagnostic imaging of other parts of digestive tract: Secondary | ICD-10-CM

## 2012-09-29 DIAGNOSIS — K5000 Crohn's disease of small intestine without complications: Secondary | ICD-10-CM

## 2012-09-29 DIAGNOSIS — K56609 Unspecified intestinal obstruction, unspecified as to partial versus complete obstruction: Secondary | ICD-10-CM

## 2012-09-29 LAB — COMPREHENSIVE METABOLIC PANEL
ALT: 22 U/L (ref 0–53)
AST: 16 U/L (ref 0–37)
Albumin: 3.3 g/dL — ABNORMAL LOW (ref 3.5–5.2)
Alkaline Phosphatase: 63 U/L (ref 39–117)
BUN: 27 mg/dL — ABNORMAL HIGH (ref 6–23)
CO2: 29 mEq/L (ref 19–32)
Calcium: 9.3 mg/dL (ref 8.4–10.5)
Chloride: 99 mEq/L (ref 96–112)
Creatinine, Ser: 1.32 mg/dL (ref 0.50–1.35)
GFR calc Af Amer: 57 mL/min — ABNORMAL LOW (ref >90)
GFR calc non Af Amer: 49 mL/min — ABNORMAL LOW (ref >90)
Glucose, Bld: 162 mg/dL — ABNORMAL HIGH (ref 70–99)
Potassium: 4.2 mEq/L (ref 3.5–5.1)
Sodium: 137 mEq/L (ref 135–145)
Total Bilirubin: 1.9 mg/dL — ABNORMAL HIGH (ref 0.3–1.2)
Total Protein: 6.7 g/dL (ref 6.0–8.3)

## 2012-09-29 LAB — CBC
HCT: 46.5 % (ref 39.0–52.0)
Hemoglobin: 15.5 g/dL (ref 13.0–17.0)
MCH: 31.1 pg (ref 26.0–34.0)
MCHC: 33.3 g/dL (ref 30.0–36.0)
MCV: 93.4 fL (ref 78.0–100.0)
Platelets: 209 10*3/uL (ref 150–400)
RBC: 4.98 MIL/uL (ref 4.22–5.81)
RDW: 13.3 % (ref 11.5–15.5)
WBC: 5.8 10*3/uL (ref 4.0–10.5)

## 2012-09-29 LAB — PROTIME-INR
INR: 1.07 (ref 0.00–1.49)
Prothrombin Time: 13.8 seconds (ref 11.6–15.2)

## 2012-09-29 NOTE — Progress Notes (Signed)
Patient had a large bowel movement.  Durwin Nora RN

## 2012-09-29 NOTE — Progress Notes (Signed)
Subjective: Admitted last night with recurrent SBO.  NGT in place.  Solumedrol given. Appreciate Surgery input. GI to consult. Passing some gas.  No BMs. Feeling some better and Ab is softer.   Objective: Vital signs in last 24 hours: Temp:  [97.8 F (36.6 C)-98.6 F (37 C)] 98.6 F (37 C) (06/24 0458) Pulse Rate:  [103-117] 103 (06/24 0458) Resp:  [16-20] 16 (06/24 0458) BP: (116-148)/(69-83) 143/77 mmHg (06/24 0458) SpO2:  [92 %-98 %] 94 % (06/24 0458) Weight:  [76.7 kg (169 lb 1.5 oz)-79.379 kg (175 lb)] 76.7 kg (169 lb 1.5 oz) (06/23 1956) Weight change:  Last BM Date: 09/28/12  CBG (last 3)  No results found for this basename: GLUCAP,  in the last 72 hours  Intake/Output from previous day:  Intake/Output Summary (Last 24 hours) at 09/29/12 0726 Last data filed at 09/29/12 0700  Gross per 24 hour  Intake      0 ml  Output    300 ml  Net   -300 ml   06/23 0701 - 06/24 0700 In: -  Out: 300 [Urine:300]   Physical Exam  General appearance: Looks better than he ought too. Eyes: no scleral icterus Throat: oropharynx moist without erythema Resp: CTA Cardio: Reg.  1/6 [name]. GI: softer, less-tender, less bloated; bowel sounds distant; no masses,  no organomegaly.  NGT in place.  Foley Extremities: no clubbing, cyanosis or edema.  SCDs on.   Lab Results:  Recent Labs  09/28/12 1245 09/29/12 0440  NA 134* 137  K 3.7 4.2  CL 93* 99  CO2 28 29  GLUCOSE 153* 162*  BUN 24* 27*  CREATININE 1.79* 1.32  CALCIUM 10.0 9.3     Recent Labs  09/28/12 1245 09/29/12 0440  AST 21 16  ALT 15 22  ALKPHOS 79 63  BILITOT 2.6* 1.9*  PROT 7.8 6.7  ALBUMIN 4.1 3.3*     Recent Labs  09/28/12 1245 09/29/12 0440  WBC 9.1 5.8  NEUTROABS 6.5  --   HGB 17.2* 15.5  HCT 49.7 46.5  MCV 93.1 93.4  PLT 229 209    Lab Results  Component Value Date   INR 1.07 09/29/2012   INR 1.0 12/14/2010    No results found for this basename: CKTOTAL, CKMB, CKMBINDEX, TROPONINI,   in the last 72 hours  No results found for this basename: TSH, T4TOTAL, FREET3, T3FREE, THYROIDAB,  in the last 72 hours  No results found for this basename: VITAMINB12, FOLATE, FERRITIN, TIBC, IRON, RETICCTPCT,  in the last 72 hours  Micro Results: No results found for this or any previous visit (from the past 240 hour(s)).   Studies/Results: Ct Abdomen Pelvis Wo Contrast  09/28/2012   *RADIOLOGY REPORT*  Clinical Data: Nausea, history of prior bowel resection and kidney stones  CT ABDOMEN AND PELVIS WITHOUT CONTRAST  Technique:  Multidetector CT imaging of the abdomen and pelvis was performed following the standard protocol without intravenous contrast.  Comparison: Abdomen plain films of 09/28/2012 and CT abdomen pelvis of 06/02/2011  Findings: The lung bases are clear.  There is some fluid within a slightly distended distal esophagus probably due to reflux.  The liver is unremarkable in the unenhanced state.  Surgical clips are present from prior cholecystectomy.  The pancreas appears relatively atrophic.  The adrenal glands and spleen are unremarkable.  The stomach is fluid distended with probable food debris layering near the fundus.  No hydronephrosis is seen.  There do appear to be a  few small nonobstructing calculi within the upper pole of the left kidney.  The abdominal aorta is normal in caliber. No adenopathy is noted.  However, there are dilated loops of small bowel throughout the abdomen pelvis.  The distal ileum appears decompressed, with an apparent change in caliber where there is kinking of small bowel within the right lower quadrant - right mid pelvis anteriorly.  No mass is seen and this kinking and abrupt change in caliber would suggest the presence of adhesions.  No free fluid is seen within the pelvis.  The prostate is within normal limits in size for age.  A Foley catheter is within the urinary bladder.  There are multiple rectosigmoid and descending colon diverticula present.   It appears that the patient has undergone prior right hemicolectomy and the anastomosis in the right abdomen is unremarkable.  Bilateral inguinal hernias are present containing fat. Hardware for posterior fusion from L3-S1 is noted.  There appears to be solid fusion at L3- 4 and L5- S1 with ray cage fusion at L4-5.  IMPRESSION:  1.  Small bowel obstruction with apparent change in caliber within the anterior right lower quadrant - right upper pelvis most likely due to an adhesion.  No bowel wall edema is seen. 2.  Small nonobstructing left upper pole renal calculi. 3.  Some fluid in the slightly distended distal esophagus may indicate reflux. 4.  Rectosigmoid and descending colon diverticula. 5.  Bilateral inguinal hernias containing fat.   Original Report Authenticated By: Ivar Drape, [name].D.   Dg Abd Acute W/chest  09/28/2012   *RADIOLOGY REPORT*  Clinical Data: Abdominal pain.  Nausea and vomiting.  Previous surgery.  ACUTE ABDOMEN SERIES (ABDOMEN 2 VIEW & CHEST 1 VIEW)  Comparison: 06/04/2011  Findings: There are markedly dilated fluid and air filled loops of small intestine consistent with small bowel obstruction.  No free air.  Previous spinal surgery is noted.  Previous cholecystectomy.  One-view chest shows normal heart and mediastinal shadows.  There is mild atelectasis at the left base.  No free air.  IMPRESSION: Markedly dilated fluid and air filled loops of small intestine consistent with small bowel obstruction.   Original Report Authenticated By: Nelson Chimes, [name].D.     Medications: Scheduled: . methylPREDNISolone (SOLU-MEDROL) injection  60 mg Intravenous Q24H  . pantoprazole (PROTONIX) IV  40 mg Intravenous Q24H   Continuous: . sodium chloride 60 mL/hr at 09/28/12 2029     Assessment/Plan: Active Problems:   HYPERTENSION   CAD   CROHN'S DISEASE-SMALL INTESTINE   SBO (small bowel obstruction)   Dehydration   Acute kidney injury  Recurrent SBO - due to Adhesions, known stricture at  anastomosis due to prior surgery, small hernia related versus Active Crohn's flare. NPO, Bowel Rest, NGT, Fluids, Steroids for possible Crohns flare. Pain and nausea Rx ordered.  Appreciate Surgery consult and will let GI know of admit.   DeH c increased Cr and hemoconcentration on admission that IV Fluids has already fixed.  Protein Cal Malnutrition as evidenced from low albumin. At 3.3 we have some room prior to needin TNA/TPN.  U Retention - Foley. flomax when tolerating POs.  LBP - may have been early SBO or the Tramadol/Muscle relaxors promoted Constipation triggering the ileus?  No current issue now that Ab issue is present. CAD- status post anterior ST elevation myocardial infarction treated with bare-metal stent to the LAD at [facility] [place] in 1/09. Last cath by Dr. Johnsie Cancel on 9/13: pLAD 30-40%, stent ok, ?  If stent oversized, EF 60% and low LVEDP. Medical therapy continued.  Ischmeic CM c EF 35-40% improved to 60% with med management. Per Dr Haroldine Laws  Hypertension - BP Ok off oral meds for now,  Crohn's disease c h/o Distant ileal and right colon resection on chronic [place]. Dr Oretha Caprice. On Cholestyramine for stool bulk.  GERD - PPI IV until taking POs.  PD - Dr Linus Mako. Sinemet. Watch for Sxs with stopping PD Drugs suddenly.  DVT Prophylaxis - TED Hose/Squeezers.         LOS: 1 day   [name] [name] 09/29/2012, 7:26 AM

## 2012-09-29 NOTE — Progress Notes (Signed)
Patient's ng tube adjusted with 300 cc's of output after adjusting.  Durwin Nora RN

## 2012-09-29 NOTE — Progress Notes (Signed)
Continues to have flatus.   NGT  Follow non operatively for now.

## 2012-09-29 NOTE — Consult Note (Signed)
Agree with above 

## 2012-09-29 NOTE — Progress Notes (Signed)
[name], male   DOB: January 10, 1929, 79 y.o.   MRN: [id number]    Subjective: Pt feels better today.  Still passing flatus, but no BMs   Objective: Vital signs in last 24 hours: Temp:  [97.8 F (36.6 C)-98.6 F (37 C)] 98.6 F (37 C) (06/24 0458) Pulse Rate:  [103-117] 103 (06/24 0458) Resp:  [16-20] 16 (06/24 0458) BP: (116-148)/(69-83) 143/77 mmHg (06/24 0458) SpO2:  [92 %-98 %] 94 % (06/24 0458) Weight:  [169 lb 1.5 oz (76.7 kg)-175 lb (79.379 kg)] 169 lb 1.5 oz (76.7 kg) (06/23 1956) Last BM Date: 09/28/12  Intake/Output from previous day: 06/23 0701 - 06/24 0700 In: -  Out: 300 [Urine:300] Intake/Output this shift: Total I/O In: 0  Out: 250 [Urine:250]  PE: Abd: softer, still with some distention, few BS, less tender, NGT with bilious output  Lab Results:   Recent Labs  09/28/12 1245 09/29/12 0440  WBC 9.1 5.8  HGB 17.2* 15.5  HCT 49.7 46.5  PLT 229 209   BMET  Recent Labs  09/28/12 1245 09/29/12 0440  NA 134* 137  K 3.7 4.2  CL 93* 99  CO2 28 29  GLUCOSE 153* 162*  BUN 24* 27*  CREATININE 1.79* 1.32  CALCIUM 10.0 9.3   PT/INR  Recent Labs  09/29/12 0440  LABPROT 13.8  INR 1.07   CMP     Component Value Date/Time   NA 137 09/29/2012 0440   K 4.2 09/29/2012 0440   CL 99 09/29/2012 0440   CO2 29 09/29/2012 0440   GLUCOSE 162* 09/29/2012 0440   BUN 27* 09/29/2012 0440   CREATININE 1.32 09/29/2012 0440   CALCIUM 9.3 09/29/2012 0440   PROT 6.7 09/29/2012 0440   ALBUMIN 3.3* 09/29/2012 0440   AST 16 09/29/2012 0440   ALT 22 09/29/2012 0440   ALKPHOS 63 09/29/2012 0440   BILITOT 1.9* 09/29/2012 0440   GFRNONAA 49* 09/29/2012 0440   GFRAA 57* 09/29/2012 0440   Lipase     Component Value Date/Time   LIPASE 23 06/02/2011 1535       Studies/Results: Ct Abdomen Pelvis Wo Contrast  09/28/2012   *RADIOLOGY REPORT*  Clinical Data: Nausea, history of prior bowel resection and kidney stones  CT ABDOMEN AND PELVIS WITHOUT CONTRAST   Technique:  Multidetector CT imaging of the abdomen and pelvis was performed following the standard protocol without intravenous contrast.  Comparison: Abdomen plain films of 09/28/2012 and CT abdomen pelvis of 06/02/2011  Findings: The lung bases are clear.  There is some fluid within a slightly distended distal esophagus probably due to reflux.  The liver is unremarkable in the unenhanced state.  Surgical clips are present from prior cholecystectomy.  The pancreas appears relatively atrophic.  The adrenal glands and spleen are unremarkable.  The stomach is fluid distended with probable food debris layering near the fundus.  No hydronephrosis is seen.  There do appear to be a few small nonobstructing calculi within the upper pole of the left kidney.  The abdominal aorta is normal in caliber. No adenopathy is noted.  However, there are dilated loops of small bowel throughout the abdomen pelvis.  The distal ileum appears decompressed, with an apparent change in caliber where there is kinking of small bowel within the right lower quadrant - right mid pelvis anteriorly.  No mass is seen and this kinking and abrupt change in caliber would suggest the presence of adhesions.  No free fluid is seen within the  pelvis.  The prostate is within normal limits in size for age.  A Foley catheter is within the urinary bladder.  There are multiple rectosigmoid and descending colon diverticula present.  It appears that the patient has undergone prior right hemicolectomy and the anastomosis in the right abdomen is unremarkable.  Bilateral inguinal hernias are present containing fat. Hardware for posterior fusion from L3-S1 is noted.  There appears to be solid fusion at L3- 4 and L5- S1 with ray cage fusion at L4-5.  IMPRESSION:  1.  Small bowel obstruction with apparent change in caliber within the anterior right lower quadrant - right upper pelvis most likely due to an adhesion.  No bowel wall edema is seen. 2.  Small nonobstructing  left upper pole renal calculi. 3.  Some fluid in the slightly distended distal esophagus may indicate reflux. 4.  Rectosigmoid and descending colon diverticula. 5.  Bilateral inguinal hernias containing fat.   Original Report Authenticated By: Ivar Drape, M.D.   Dg Abd 2 Views  09/29/2012   *RADIOLOGY REPORT*  Clinical Data: Small bowel obstruction, follow-up  ABDOMEN - 2 VIEW  Comparison: CT abdomen and pelvis 09/28/2012, abdominal radiographs 09/28/2012  Findings: Persistent small bowel dilatation compatible with high-grade small bowel obstruction. Paucity of colonic gas. Small bowel loops measure up to 7.6 cm diameter, previously 6.3 cm maximum. No definite bowel wall thickening or free intraperitoneal air. Surgical clips right upper quadrant consistent with cholecystectomy. Prior lumbar fusion. Tip of nasogastric tube at GE junction, recommend advancing tube 8 cm. Bones demineralized.  IMPRESSION: Persistent small bowel obstruction with increased small bowel distention since previous exam. Recommend advancing nasogastric tube 8 cm to place proximal side port within stomach.   Original Report Authenticated By: Lavonia Dana, M.D.   Dg Abd Acute W/chest  09/28/2012   *RADIOLOGY REPORT*  Clinical Data: Abdominal pain.  Nausea and vomiting.  Previous surgery.  ACUTE ABDOMEN SERIES (ABDOMEN 2 VIEW & CHEST 1 VIEW)  Comparison: 06/04/2011  Findings: There are markedly dilated fluid and air filled loops of small intestine consistent with small bowel obstruction.  No free air.  Previous spinal surgery is noted.  Previous cholecystectomy.  One-view chest shows normal heart and mediastinal shadows.  There is mild atelectasis at the left base.  No free air.  IMPRESSION: Markedly dilated fluid and air filled loops of small intestine consistent with small bowel obstruction.   Original Report Authenticated By: Nelson Chimes, M.D.    Anti-infectives: Anti-infectives   None       Assessment/Plan  1. PSBO, likely  secondary to adhesive disease.  No evidence of crohn's seen 2. H/o crohn's disease  Plan: 1. NGT has been advanced per x-ray recs 2. Cont NGT and bowel rest. 3. Hopefully we can dc his NGT within the next 1-2 days.  LOS: 1 day    Ife Vitelli E 09/29/2012, 11:01 AM Pager: [phone]

## 2012-09-29 NOTE — Consult Note (Signed)
[facility] Consult: 12:36 PM 09/29/2012   Referring Provider:   Primary Care Physician:  [name], MD Primary Gastroenterologist:  Dr Ardis Hughs  Reason for Consultation:  SBO   HPI: [name] [name] is a 79 y.o. male. Hx of Crohn's dz with remote 1978 ileo/right colectomy for SBO.  Maintenance meds are Pentasa and Questran.  Previous meds include Purinethol and Entocort.  Had PSBO managed medically in May 2008 after an MI.  Ileus post spine surgery (not felt to be from crohn's activity) in summer 2010. PSBO and dark stools with CG emesis in 05/2011.  CT suggested transition point in the pelvis. Not clear if there was active Crohn's disease contributing to this, was treated with rapid prednisone taper.   Last colonoscopy was 2009: Crohns activity at distal 10 cm of ileum and anastomosis, with stricturing at anastomosis. Pt was not interested in repeat colonoscopy for restaging as of GI office visit of 07/2011 and 01/2012.  He downplays his loose stools and has not been bothered by his need to wear diapers.   Pt has also had problems with dysphagia and retention of barium tablet on 05/2011 esophagram.  However EGD off Plavix  was cancelled due to hospitalization and was never rescheduled. He sometimes coughs whe he eats but does not perceive food/liquid getting caught in his esophagus.  Wife says general cough is getting worse and he can cough so hard he regurgitates.  Appetite is good and weight is stable.   Diagnosed with Parkinson's in 2013. But he still works out at [employer] 3 x per week, works with trainer 1 x per week and plays 18 holes of golf 1 x per week.   Admitted yesterday with SBO One week ago started taking meds for back pain.  This caused constipation and urinary retention. Treated in ED 6/22 with placement of foley, advised to initiate Miralax and sent back to [facility].  Did not have imaging. Had not had BM in>48 hours but then had  about 5 large BMs after taking the Miralax. Unfortunately he became nauseated, and abdomen became painful, distended, bloated .  Returned to ED where AAS showed SBO.  CT scan shows "apparent change in caliber within the anterior right lower quadrant - right upper pelvis most likely due to an adhesion.  CT also shows slight distention and fluid in distal esophagus and small, non-obstructing left kidney stone.  Surgery has seen pt yesterday and today.  He is feeling better following NGT placement. The belly is now soft but still distended.  Not a lot of improvement on this AM xray, but the NGT has been advanced as per radiologist recommendation.   [facility]:  No abdominal pain.  2 to 3 formed or loose stools daily.  Fecal urgency and occasional incontinence prompts his ongoing use of diapers. Eats well.     Past Medical History  Diagnosis Date  . Hypertension   . Hyperlipidemia   . Coronary artery disease     s/p ANTERIOR STEMI 1/09 at [place]; treated with a bare-metal stent to the LAD; Myoview 11/11 EF 71%, no scar, no ischemia;     cath 12/20/10:   pLAD 30-40%, stent ok, ? If stent oversized, EF 60% and low LVEDP  . GERD (gastroesophageal reflux disease)   . Other esophagitis   . Diaphragmatic hernia without mention of obstruction or gangrene   . Ulcerative (chronic) ileocolitis   . Diverticulosis of colon (without mention of hemorrhage)   . Other chronic nonalcoholic  liver disease   . Regional enteritis of small intestine   . Flatulence, eructation, and gas pain   . Ischemic cardiomyopathy     EF initially 35-40% after MI 1/09; echo 7/08: EF 60%  . Fatty liver     on ultrasound of 10/2009  . Parkinson's disease   . Gait disorder   . Memory disorder   . Osteoporosis   . Renal calculi   . Melanoma     Past Surgical History  Procedure Laterality Date  . Cardiac catheterization  09/25/06    EF 35-40% but more recently 60%  . Cholecystectomy    . Appendectomy    . Back surgery       L3, L4, L5  . Tonsillectomy    . Partial bowel resection    . Coronary artery stent placement    . Melanoma resection    . Cataract extraction      Bilateral    Prior to Admission medications   Medication Sig Start Date End Date Taking? Authorizing Provider  acetaminophen (TYLENOL) 325 MG tablet Take 650 mg by mouth every 6 (six) hours as needed for pain.   Yes Historical Provider, MD  aspirin EC 81 MG tablet Take 81 mg by mouth daily.   Yes Historical Provider, MD  atorvastatin (LIPITOR) 40 MG tablet Take 40 mg by mouth daily.     Yes Historical Provider, MD  carbidopa-levodopa (SINEMET IR) 25-100 MG per tablet One tablet in the morning and midday, and one half tablet at night 08/20/12  Yes [name], MD  carvedilol (COREG) 6.25 MG tablet Take 6.25 mg by mouth 2 (two) times daily with a meal.   Yes Historical Provider, MD  cholecalciferol (VITAMIN D) 1000 UNITS tablet Take 2,000 Units by mouth daily.   Yes Historical Provider, MD  cholestyramine Lucrezia Starch) 4 G packet Take 1 packet by mouth 2 (two) times daily with a meal. 02/04/12 02/03/13 Yes [name], MD  cyanocobalamin 1000 MCG tablet Take 100 mcg by mouth daily.   Yes Historical Provider, MD  isosorbide mononitrate (IMDUR) 30 MG 24 hr tablet TAKE ONE TABLET BY MOUTH ONCE DAILY 08/12/12  Yes Jolaine Artist, MD  lansoprazole (PREVACID) 30 MG capsule Take 30 mg by mouth daily.   Yes Historical Provider, MD  PENTASA 250 MG CR capsule TAKE TWO CAPSULES THREE TIMES DAILY 03/09/12  Yes [name], MD  Potassium Bicarbonate 99 MG CAPS Take 1 capsule by mouth daily.   Yes Historical Provider, MD  pyridOXINE (VITAMIN B-6) 100 MG tablet Take 100 mg by mouth daily.     Yes Historical Provider, MD  Testosterone (ANDROGEL PUMP TD) Place 2 application onto the skin daily. 2 pumps once daily    Yes Historical Provider, MD  nitroGLYCERIN (NITROSTAT) 0.4 MG SL tablet Place 0.4 mg under the tongue every 5 (five) minutes as needed.  For chest pain.    Historical Provider, MD    Scheduled Meds: . methylPREDNISolone (SOLU-MEDROL) injection  60 mg Intravenous Q24H  . pantoprazole (PROTONIX) IV  40 mg Intravenous Q24H   Infusions: . sodium chloride 60 mL/hr at 09/28/12 2029   PRN Meds: LORazepam, morphine injection, ondansetron (ZOFRAN) IV, ondansetron   Allergies as of 09/28/2012  . (No Known Allergies)    Family History  Problem Relation Age of Onset  . Coronary artery disease    . Heart failure Mother   . Emphysema Father   . Heart disease Brother   .  Heart disease Brother   . Colon cancer Brother     History   Social History  . Marital Status: Married    Spouse Name: N/A    Number of Children: 3  . Years of Education: N/A   Occupational History  . CEO-retired    Social History Main Topics  . Smoking status: Never Smoker   . Smokeless tobacco: Never Used  . Alcohol Use: Yes     Comment: occ.  . Drug Use: No  . Sexually Active: Not on file   Other Topics Concern  . Not on file   Social History Narrative  . No narrative on file    REVIEW OF SYSTEMS: Per HPI No unusual bleeding or bruising Does not use sunscreen reliably Foley catheter is in place.  No blood in urine No longer takes Plavix.  Only 81 mg ASA.   PHYSICAL EXAM: Vital signs in last 24 hours: Temp:  [97.8 F (36.6 C)-98.6 F (37 C)] 98.6 F (37 C) (06/24 0458) Pulse Rate:  [103-117] 103 (06/24 0458) Resp:  [16-20] 16 (06/24 0458) BP: (116-148)/(69-83) 143/77 mmHg (06/24 0458) SpO2:  [92 %-97 %] 94 % (06/24 0458) Weight:  [76.7 kg (169 lb 1.5 oz)-79.379 kg (175 lb)] 76.7 kg (169 lb 1.5 oz) (06/23 1956)  General: pleasant, elderly WM who is comfortable and alert.  Does not look acutely ill Head:  No swellling or asymmetry  Eyes:  No icterus or pallor Ears:  Not HOH  Nose:  No discharge.  NGT in place and draining brown . Clear material.  No gross blood in this Mouth:  Clear and moist oral MM.  Good  dentition. Neck:  No mass, no TMG.  Lungs:  Clear bil.  Loose cough Heart: RRR.  No mrg Abdomen:  Distended but soft, no tenderness.  No bruits no masses.  No HSM.  BS tinkling and succusion splash present.  Rectal: deferred   Musc/Skeltl: no joint contractures or swelling Extremities:  No CCE  Neurologic:  No tremor.  Moves all 4s.  Limb strength full and equal .  Oriented x 3.  Skin:  Excessive sun exposure and actinic/solar keratosis. Tattoos:  none Nodes:  No inguinal or cervical adenopathy   Psych:  Pleasant, cooperative, jovial and relaxed.   Intake/Output from previous day: 06/23 0701 - 06/24 0700 In: -  Out: 300 [Urine:300] Intake/Output this shift: Total I/O In: 0  Out: 250 [Urine:250]  LAB RESULTS:  Recent Labs  09/28/12 1245 09/29/12 0440  WBC 9.1 5.8  HGB 17.2* 15.5  HCT 49.7 46.5  PLT 229 209   BMET Lab Results  Component Value Date   NA 137 09/29/2012   NA 134* 09/28/2012   NA 139 06/04/2011   K 4.2 09/29/2012   K 3.7 09/28/2012   K 4.0 06/04/2011   CL 99 09/29/2012   CL 93* 09/28/2012   CL 104 06/04/2011   CO2 29 09/29/2012   CO2 28 09/28/2012   CO2 25 06/04/2011   GLUCOSE 162* 09/29/2012   GLUCOSE 153* 09/28/2012   GLUCOSE 114* 06/04/2011   BUN 27* 09/29/2012   BUN 24* 09/28/2012   BUN 18 06/04/2011   CREATININE 1.32 09/29/2012   CREATININE 1.79* 09/28/2012   CREATININE 1.03 06/04/2011   CALCIUM 9.3 09/29/2012   CALCIUM 10.0 09/28/2012   CALCIUM 8.6 06/04/2011   LFT  Recent Labs  09/28/12 1245 09/29/12 0440  PROT 7.8 6.7  ALBUMIN 4.1 3.3*  AST 21 16  ALT  15 22  ALKPHOS 79 61  BILITOT 2.6* 1.9*   PT/INR Lab Results  Component Value Date   INR 1.07 09/29/2012   INR 1.0 12/14/2010    RADIOLOGY STUDIES: Ct Abdomen Pelvis Wo Contrast 09/28/2012   *RADIOLOGY REPORT*  Clinical Data: Nausea, history of prior bowel resection and kidney stones  CT ABDOMEN AND PELVIS WITHOUT CONTRAST   Findings: The lung bases are clear.  There is some fluid within a  slightly distended distal esophagus probably due to reflux.  The liver is unremarkable in the unenhanced state.  Surgical clips are present from prior cholecystectomy.  The pancreas appears relatively atrophic.  The adrenal glands and spleen are unremarkable.  The stomach is fluid distended with probable food debris layering near the fundus.  No hydronephrosis is seen.  There do appear to be a few small nonobstructing calculi within the upper pole of the left kidney.  The abdominal aorta is normal in caliber. No adenopathy is noted.  However, there are dilated loops of small bowel throughout the abdomen pelvis.  The distal ileum appears decompressed, with an apparent change in caliber where there is kinking of small bowel within the right lower quadrant - right mid pelvis anteriorly.  No mass is seen and this kinking and abrupt change in caliber would suggest the presence of adhesions.  No free fluid is seen within the pelvis.  The prostate is within normal limits in size for age.  A Foley catheter is within the urinary bladder.  There are multiple rectosigmoid and descending colon diverticula present.  It appears that the patient has undergone prior right hemicolectomy and the anastomosis in the right abdomen is unremarkable.  Bilateral inguinal hernias are present containing fat. Hardware for posterior fusion from L3-S1 is noted.  There appears to be solid fusion at L3- 4 and L5- S1 with ray cage fusion at L4-5.  IMPRESSION:  1.  Small bowel obstruction with apparent change in caliber within the anterior right lower quadrant - right upper pelvis most likely due to an adhesion.  No bowel wall edema is seen. 2.  Small nonobstructing left upper pole renal calculi. 3.  Some fluid in the slightly distended distal esophagus may indicate reflux. 4.  Rectosigmoid and descending colon diverticula. 5.  Bilateral inguinal hernias containing fat.   Original Report Authenticated By: Ivar Drape, M.D.   Dg Abd 2  Views 09/29/2012   Findings: Persistent small bowel dilatation compatible with high-grade small bowel obstruction. Paucity of colonic gas. Small bowel loops measure up to 7.6 cm diameter, previously 6.3 cm maximum. No definite bowel wall thickening or free intraperitoneal air. Surgical clips right upper quadrant consistent with cholecystectomy. Prior lumbar fusion. Tip of nasogastric tube at GE junction, recommend advancing tube 8 cm. Bones demineralized.  IMPRESSION: Persistent small bowel obstruction with increased small bowel distention since previous exam. Recommend advancing nasogastric tube 8 cm to place proximal side port within stomach.   Original Report Authenticated By: Lavonia Dana, M.D.   Dg Abd Acute W/chest 09/28/2012  Findings: There are markedly dilated fluid and air filled loops of small intestine consistent with small bowel obstruction.  No free air.  Previous spinal surgery is noted.  Previous cholecystectomy.  One-view chest shows normal heart and mediastinal shadows.  There is mild atelectasis at the left base.  No free air.  IMPRESSION: Markedly dilated fluid and air filled loops of small intestine consistent with small bowel obstruction.   Original Report Authenticated By: Nelson Chimes, M.D.  ENDOSCOPIC STUDIES: EGD January 1999 [facility]  Esophagogastroduodenitis.   Colonoscopy January 2009 [facility]  Mild changes pf Crohns at ileocolonic anastomosis and at 10 cm of adjacent small bowel. Anastomosis mildly stenotic.    IMPRESSION: *  SBO, resolving.  Surgery managing. *  Hx Crohns.  No CT evidence of active SB or colonic disease.  Stable quiescent sxs on Pentasa and Questran.  *  Persistent cough.  This may be from retained esophageal contents, dysphagia. This does not sound GI by history  Was to have EGD/dilatation in 2013 that never happened.     PLAN: *  Management of SBO per Dr Barry Dienes     LOS: 1 day   [name]  09/29/2012, 12:36 PM Pager: [phone]  GI  ATTENDING  Patient personally seen and examined. History, laboratories, and old records reviewed. X-rays reviewed. Gershon Mussel has a remote history of Crohn's disease for which she underwent resection. He presents at this time with significant partial bowel obstruction secondary to adhesions. Similar problem 6 years ago. He has done well in the interim. No evidence for Crohn's active disease at this time. Agree with management per surgical team. Continue to monitor NG output, physical exam, and x-rays.  Docia Chuck. Geri Seminole., M.D. [facility] Division of Gastroenterology

## 2012-09-30 MED ORDER — ENOXAPARIN SODIUM 40 MG/0.4ML ~~LOC~~ SOLN
40.0000 mg | SUBCUTANEOUS | Status: DC
  Administered 2012-09-30 – 2012-10-02 (×3): 40 mg via SUBCUTANEOUS
  Filled 2012-09-30 (×3): qty 0.4

## 2012-09-30 NOTE — Evaluation (Signed)
Physical Therapy Evaluation Patient Details Name: [name] [name] MRN: [id number] DOB: 1928/10/18 Today's Date: 09/30/2012 Time: 6546-5035 PT Time Calculation (min): 10 min  PT Assessment / Plan / Recommendation Clinical Impression  *79 y.o. Male with h/o parkinson's admitted with SBO. Pt ambulated 300' independently, no LOB. Pt is independent with mobility. No futher PT needed. No DME needed. PT signing off. **    PT Assessment  Patent does not need any further PT services    Follow Up Recommendations  No PT follow up    Does the patient have the potential to tolerate intense rehabilitation      Barriers to Discharge        Equipment Recommendations  None recommended by PT    Recommendations for Other Services     Frequency      Precautions / Restrictions Precautions Precautions: None Restrictions Weight Bearing Restrictions: No   Pertinent Vitals/Pain *0/10 pain**      Mobility  Bed Mobility Bed Mobility: Supine to Sit Supine to Sit: 7: Independent Transfers Transfers: Sit to Stand;Stand to Sit Sit to Stand: 6: Modified independent (Device/Increase time);From bed;With upper extremity assist Stand to Sit: 6: Modified independent (Device/Increase time);With upper extremity assist;To chair/3-in-1;With armrests Ambulation/Gait Ambulation/Gait Assistance: 7: Independent Ambulation Distance (Feet): 300 Feet Assistive device: None Gait Pattern: Within Functional Limits Gait velocity: WFL General Gait Details: no LOB    Exercises     PT Diagnosis:    PT Problem List:   PT Treatment Interventions:       PT Goals(Current goals can be found in the care plan section)    Visit Information  Last PT Received On: 09/30/12 Assistance Needed: +1 History of Present Illness: 79 y.o. 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    Solicitor Arousal/Alertness: Awake/alert Behavior During Therapy: WFL for tasks assessed/performed Overall Cognitive Status: Within Functional Limits for tasks assessed    Extremity/Trunk Assessment Upper Extremity Assessment Upper Extremity Assessment: Overall WFL for tasks assessed Lower Extremity Assessment Lower Extremity Assessment: Overall WFL for tasks assessed Cervical / Trunk Assessment Cervical / Trunk Assessment: Normal   Balance    End of Session PT - End of Session Activity Tolerance: Patient tolerated treatment well Patient left: in chair Nurse Communication: Mobility status  GP     [name] [name] 09/30/2012, 12:46 PM [phone]

## 2012-09-30 NOTE — Progress Notes (Signed)
[name], male   DOB: [birth year], 79 y.o.   MRN: [id number]    Subjective: Pt feels like new man today, had multiple BMs yesterday and one this am, denies n/v or pain, wants to eat   Objective: Vital signs in last 24 hours: Temp:  [97.7 F (36.5 C)-98.8 F (37.1 C)] 97.7 F (36.5 C) (06/25 0516) Pulse Rate:  [91-107] 91 (06/25 0516) Resp:  [18] 18 (06/25 0516) BP: (123-138)/(65-72) 130/72 mmHg (06/25 0516) SpO2:  [93 %-94 %] 94 % (06/25 0516) Last BM Date: 09/30/12  Intake/Output from previous day: 06/24 0701 - 06/25 0700 In: 480 [I.V.:480] Out: 2400 [Urine:1150; Emesis/NG output:1250] Intake/Output this shift: Total I/O In: -  Out: 200 [Urine:200]  PE: Abd: soft, active bowel sounds, nontender, NGT with bilious output  Lab Results:   Recent Labs  09/28/12 1245 09/29/12 0440  WBC 9.1 5.8  HGB 17.2* 15.5  HCT 49.7 46.5  PLT 229 209   BMET  Recent Labs  09/28/12 1245 09/29/12 0440  NA 134* 137  K 3.7 4.2  CL 93* 99  CO2 28 29  GLUCOSE 153* 162*  BUN 24* 27*  CREATININE 1.79* 1.32  CALCIUM 10.0 9.3   PT/INR  Recent Labs  09/29/12 0440  LABPROT 13.8  INR 1.07   CMP     Component Value Date/Time   NA 137 09/29/2012 0440   K 4.2 09/29/2012 0440   CL 99 09/29/2012 0440   CO2 29 09/29/2012 0440   GLUCOSE 162* 09/29/2012 0440   BUN 27* 09/29/2012 0440   CREATININE 1.32 09/29/2012 0440   CALCIUM 9.3 09/29/2012 0440   PROT 6.7 09/29/2012 0440   ALBUMIN 3.3* 09/29/2012 0440   AST 16 09/29/2012 0440   ALT 22 09/29/2012 0440   ALKPHOS 63 09/29/2012 0440   BILITOT 1.9* 09/29/2012 0440   GFRNONAA 49* 09/29/2012 0440   GFRAA 57* 09/29/2012 0440   Lipase     Component Value Date/Time   LIPASE 23 06/02/2011 1535       Studies/Results: Ct Abdomen Pelvis Wo Contrast  09/28/2012   *RADIOLOGY REPORT*  Clinical Data: Nausea, history of prior bowel resection and kidney stones  CT ABDOMEN AND PELVIS WITHOUT CONTRAST  Technique:  Multidetector CT  imaging of the abdomen and pelvis was performed following the standard protocol without intravenous contrast.  Comparison: Abdomen plain films of 09/28/2012 and CT abdomen pelvis of 06/02/2011  Findings: The lung bases are clear.  There is some fluid within a slightly distended distal esophagus probably due to reflux.  The liver is unremarkable in the unenhanced state.  Surgical clips are present from prior cholecystectomy.  The pancreas appears relatively atrophic.  The adrenal glands and spleen are unremarkable.  The stomach is fluid distended with probable food debris layering near the fundus.  No hydronephrosis is seen.  There do appear to be a few small nonobstructing calculi within the upper pole of the left kidney.  The abdominal aorta is normal in caliber. No adenopathy is noted.  However, there are dilated loops of small bowel throughout the abdomen pelvis.  The distal ileum appears decompressed, with an apparent change in caliber where there is kinking of small bowel within the right lower quadrant - right mid pelvis anteriorly.  No mass is seen and this kinking and abrupt change in caliber would suggest the presence of adhesions.  No free fluid is seen within the pelvis.  The prostate is within normal limits in size for  age.  A Foley catheter is within the urinary bladder.  There are multiple rectosigmoid and descending colon diverticula present.  It appears that the patient has undergone prior right hemicolectomy and the anastomosis in the right abdomen is unremarkable.  Bilateral inguinal hernias are present containing fat. Hardware for posterior fusion from L3-S1 is noted.  There appears to be solid fusion at L3- 4 and L5- S1 with ray cage fusion at L4-5.  IMPRESSION:  1.  Small bowel obstruction with apparent change in caliber within the anterior right lower quadrant - right upper pelvis most likely due to an adhesion.  No bowel wall edema is seen. 2.  Small nonobstructing left upper pole renal  calculi. 3.  Some fluid in the slightly distended distal esophagus may indicate reflux. 4.  Rectosigmoid and descending colon diverticula. 5.  Bilateral inguinal hernias containing fat.   Original Report Authenticated By: Ivar Drape, M.D.   Dg Abd 2 Views  09/29/2012   *RADIOLOGY REPORT*  Clinical Data: Small bowel obstruction, follow-up  ABDOMEN - 2 VIEW  Comparison: CT abdomen and pelvis 09/28/2012, abdominal radiographs 09/28/2012  Findings: Persistent small bowel dilatation compatible with high-grade small bowel obstruction. Paucity of colonic gas. Small bowel loops measure up to 7.6 cm diameter, previously 6.3 cm maximum. No definite bowel wall thickening or free intraperitoneal air. Surgical clips right upper quadrant consistent with cholecystectomy. Prior lumbar fusion. Tip of nasogastric tube at GE junction, recommend advancing tube 8 cm. Bones demineralized.  IMPRESSION: Persistent small bowel obstruction with increased small bowel distention since previous exam. Recommend advancing nasogastric tube 8 cm to place proximal side port within stomach.   Original Report Authenticated By: Lavonia Dana, M.D.   Dg Abd Acute W/chest  09/28/2012   *RADIOLOGY REPORT*  Clinical Data: Abdominal pain.  Nausea and vomiting.  Previous surgery.  ACUTE ABDOMEN SERIES (ABDOMEN 2 VIEW & CHEST 1 VIEW)  Comparison: 06/04/2011  Findings: There are markedly dilated fluid and air filled loops of small intestine consistent with small bowel obstruction.  No free air.  Previous spinal surgery is noted.  Previous cholecystectomy.  One-view chest shows normal heart and mediastinal shadows.  There is mild atelectasis at the left base.  No free air.  IMPRESSION: Markedly dilated fluid and air filled loops of small intestine consistent with small bowel obstruction.   Original Report Authenticated By: Nelson Chimes, M.D.    Anti-infectives: Anti-infectives   None       Assessment/Plan 1. PSBO, likely secondary to adhesive  disease.  No evidence of crohn's seen 2. H/o crohn's disease  Plan: 1.+bowel function so will clamp NGT for 6 hours and if no n/v will d/c and start clears. 2. D/c foley, if retention bladder scan 3. Ambulate frequently 4. Hopefully has turned corner.  LOS: 2 days    WHITE, ELIZABETH 09/30/2012, 8:49 AM

## 2012-09-30 NOTE — Progress Notes (Signed)
Subjective: Admitted with recurrent SBO/DeH and Azotemia.  NGT in place.  Solumedrol given. Appreciate Surgery and GI input. S/P several BM's. Out of shower. Feeling some better and Ab is softer.   Objective: Vital signs in last 24 hours: Temp:  [97.7 F (36.5 C)-98.8 F (37.1 C)] 97.7 F (36.5 C) (06/25 0516) Pulse Rate:  [91-107] 91 (06/25 0516) Resp:  [18] 18 (06/25 0516) BP: (123-138)/(65-72) 130/72 mmHg (06/25 0516) SpO2:  [93 %-94 %] 94 % (06/25 0516) Weight change:  Last BM Date: 09/29/12  CBG (last 3)  No results found for this basename: GLUCAP,  in the last 72 hours  Intake/Output from previous day:  Intake/Output Summary (Last 24 hours) at 09/30/12 0646 Last data filed at 09/30/12 0600  Gross per 24 hour  Intake    480 ml  Output   2500 ml  Net  -2020 ml   06/24 0701 - 06/25 0700 In: 480 [I.V.:480] Out: 2400 [Urine:1150; Emesis/NG output:1250]   Physical Exam  General appearance: Looks better than he ought too. Eyes: no scleral icterus Throat: oropharynx moist without erythema Resp: CTA Cardio: Reg.  1/6 m. GI: softer, less-tender, less bloated; bowel sounds +; no masses,  no organomegaly.  NGT in place.  Foley Extremities: no clubbing, cyanosis or edema.  SCDs on.   Lab Results:  Recent Labs  09/28/12 1245 09/29/12 0440  NA 134* 137  K 3.7 4.2  CL 93* 99  CO2 28 29  GLUCOSE 153* 162*  BUN 24* 27*  CREATININE 1.79* 1.32  CALCIUM 10.0 9.3     Recent Labs  09/28/12 1245 09/29/12 0440  AST 21 16  ALT 15 22  ALKPHOS 79 63  BILITOT 2.6* 1.9*  PROT 7.8 6.7  ALBUMIN 4.1 3.3*     Recent Labs  09/28/12 1245 09/29/12 0440  WBC 9.1 5.8  NEUTROABS 6.5  --   HGB 17.2* 15.5  HCT 49.7 46.5  MCV 93.1 93.4  PLT 229 209    Lab Results  Component Value Date   INR 1.07 09/29/2012   INR 1.0 12/14/2010    No results found for this basename: CKTOTAL, CKMB, CKMBINDEX, TROPONINI,  in the last 72 hours  No results found for this  basename: TSH, T4TOTAL, FREET3, T3FREE, THYROIDAB,  in the last 72 hours  No results found for this basename: VITAMINB12, FOLATE, FERRITIN, TIBC, IRON, RETICCTPCT,  in the last 72 hours  Micro Results: No results found for this or any previous visit (from the past 240 hour(s)).   Studies/Results: Ct Abdomen Pelvis Wo Contrast  09/28/2012   *RADIOLOGY REPORT*  Clinical Data: Nausea, history of prior bowel resection and kidney stones  CT ABDOMEN AND PELVIS WITHOUT CONTRAST  Technique:  Multidetector CT imaging of the abdomen and pelvis was performed following the standard protocol without intravenous contrast.  Comparison: Abdomen plain films of 09/28/2012 and CT abdomen pelvis of 06/02/2011  Findings: The lung bases are clear.  There is some fluid within a slightly distended distal esophagus probably due to reflux.  The liver is unremarkable in the unenhanced state.  Surgical clips are present from prior cholecystectomy.  The pancreas appears relatively atrophic.  The adrenal glands and spleen are unremarkable.  The stomach is fluid distended with probable food debris layering near the fundus.  No hydronephrosis is seen.  There do appear to be a few small nonobstructing calculi within the upper pole of the left kidney.  The abdominal aorta is normal in caliber. No adenopathy  is noted.  However, there are dilated loops of small bowel throughout the abdomen pelvis.  The distal ileum appears decompressed, with an apparent change in caliber where there is kinking of small bowel within the right lower quadrant - right mid pelvis anteriorly.  No mass is seen and this kinking and abrupt change in caliber would suggest the presence of adhesions.  No free fluid is seen within the pelvis.  The prostate is within normal limits in size for age.  A Foley catheter is within the urinary bladder.  There are multiple rectosigmoid and descending colon diverticula present.  It appears that the patient has undergone prior  right hemicolectomy and the anastomosis in the right abdomen is unremarkable.  Bilateral inguinal hernias are present containing fat. Hardware for posterior fusion from L3-S1 is noted.  There appears to be solid fusion at L3- 4 and L5- S1 with ray cage fusion at L4-5.  IMPRESSION:  1.  Small bowel obstruction with apparent change in caliber within the anterior right lower quadrant - right upper pelvis most likely due to an adhesion.  No bowel wall edema is seen. 2.  Small nonobstructing left upper pole renal calculi. 3.  Some fluid in the slightly distended distal esophagus may indicate reflux. 4.  Rectosigmoid and descending colon diverticula. 5.  Bilateral inguinal hernias containing fat.   Original Report Authenticated By: Ivar Drape, M.D.   Dg Abd 2 Views  09/29/2012   *RADIOLOGY REPORT*  Clinical Data: Small bowel obstruction, follow-up  ABDOMEN - 2 VIEW  Comparison: CT abdomen and pelvis 09/28/2012, abdominal radiographs 09/28/2012  Findings: Persistent small bowel dilatation compatible with high-grade small bowel obstruction. Paucity of colonic gas. Small bowel loops measure up to 7.6 cm diameter, previously 6.3 cm maximum. No definite bowel wall thickening or free intraperitoneal air. Surgical clips right upper quadrant consistent with cholecystectomy. Prior lumbar fusion. Tip of nasogastric tube at GE junction, recommend advancing tube 8 cm. Bones demineralized.  IMPRESSION: Persistent small bowel obstruction with increased small bowel distention since previous exam. Recommend advancing nasogastric tube 8 cm to place proximal side port within stomach.   Original Report Authenticated By: Lavonia Dana, M.D.   Dg Abd Acute W/chest  09/28/2012   *RADIOLOGY REPORT*  Clinical Data: Abdominal pain.  Nausea and vomiting.  Previous surgery.  ACUTE ABDOMEN SERIES (ABDOMEN 2 VIEW & CHEST 1 VIEW)  Comparison: 06/04/2011  Findings: There are markedly dilated fluid and air filled loops of small intestine consistent  with small bowel obstruction.  No free air.  Previous spinal surgery is noted.  Previous cholecystectomy.  One-view chest shows normal heart and mediastinal shadows.  There is mild atelectasis at the left base.  No free air.  IMPRESSION: Markedly dilated fluid and air filled loops of small intestine consistent with small bowel obstruction.   Original Report Authenticated By: Nelson Chimes, M.D.     Medications: Scheduled: . methylPREDNISolone (SOLU-MEDROL) injection  60 mg Intravenous Q24H  . pantoprazole (PROTONIX) IV  40 mg Intravenous Q24H   Continuous: . sodium chloride 60 mL/hr at 09/30/12 0539     Assessment/Plan: Active Problems:   HYPERTENSION   CAD   CROHN'S DISEASE-SMALL INTESTINE   SBO (small bowel obstruction)   Dehydration   Acute kidney injury  Recurrent SBO - due to Adhesions, possible stricture at anastomosis due to prior surgery, small hernia related, versus Active Crohn's flare. NPO, Bowel Rest, NGT, Fluids, Steroids for possible Crohns flare. Pain and nausea Rx ordered and working.  AAS  from yesterday reviewed.  He is passing gas and BMs.  Appreciate Surgery and GI consults. Diet and NGT being managed by surgery.   DeH c increased Cr and hemoconcentration on admission that IV Fluids has already fixed.  Protein Cal Malnutrition as evidenced from low albumin. At 3.3 we have some room prior to needing TNA/TPN.  U Retention - Foley. flomax when tolerating POs.  LBP - may have been early SBO or the Tramadol/Muscle relaxors promoted Constipation triggering the ileus?  No current issue now that Ab issue is present. CAD- status post anterior ST elevation myocardial infarction treated with bare-metal stent to the LAD at [facility] in [place] in 1/09. Last cath by Dr. Johnsie Cancel on 9/13: pLAD 30-40%, stent ok, ? If stent oversized, EF 60% and low LVEDP. Medical therapy continued.  Ischmeic CM c EF 35-40% improved to 60% with med management. Per Dr Haroldine Laws  Hypertension - BP Ok off  oral meds for now,  Crohn's disease c h/o Distant ileal and right colon resection on chronic Pentassa. Dr Oretha Caprice. On Cholestyramine for stool bulk.  GERD - PPI IV until taking POs.  PD - Dr Linus Mako. Sinemet. Watch for Sxs with stopping PD Drugs suddenly.  DVT Prophylaxis - TED Hose/Squeezers. Add lovenox as he does not appear headed to surgery   Today add Lovenox, start PT, s/p shower, get labs for tomorrow, get KUB, and await surgery recs. I will re-start oral meds when surg says I can     LOS: 2 days   Edahi Kroening M 09/30/2012, 6:46 AM

## 2012-09-30 NOTE — Progress Notes (Signed)
Agree with above 

## 2012-09-30 NOTE — Progress Notes (Signed)
[facility] Daily Rounding Note 09/30/2012, 10:08 AM  SUBJECTIVE:       Surgery clamped NGT. Pt feels well and is hungry.  2 loose stools this AM.   Foley removed  OBJECTIVE:         Vital signs in last 24 hours:    Temp:  [97.7 F (36.5 C)-98.8 F (37.1 C)] 97.7 F (36.5 C) (06/25 0516) Pulse Rate:  [91-107] 91 (06/25 0516) Resp:  [18] 18 (06/25 0516) BP: (123-138)/(65-72) 130/72 mmHg (06/25 0516) SpO2:  [93 %-94 %] 94 % (06/25 0516) Last BM Date: 09/30/12 General: comfortable, not acutely ill   Heart: RRR Chest: clear, excellent BS.  No cough Abdomen: soft, distended, not tender.  No tinkling or tympanitic BS but BS overall reduced  Extremities: no pedal edema Neuro/Psych:  Pleasant, not confused.   Intake/Output from previous day: 06/24 0701 - 06/25 0700 In: 480 [I.V.:480] Out: 2400 [Urine:1150; Emesis/NG output:1250]  Intake/Output this shift: Total I/O In: -  Out: 200 [Urine:200]  Lab Results:  Recent Labs  09/28/12 1245 09/29/12 0440  WBC 9.1 5.8  HGB 17.2* 15.5  HCT 49.7 46.5  PLT 229 209   BMET  Recent Labs  09/28/12 1245 09/29/12 0440  NA 134* 137  K 3.7 4.2  CL 93* 99  CO2 28 29  GLUCOSE 153* 162*  BUN 24* 27*  CREATININE 1.79* 1.32  CALCIUM 10.0 9.3   LFT  Recent Labs  09/28/12 1245 09/29/12 0440  PROT 7.8 6.7  ALBUMIN 4.1 3.3*  AST 21 16  ALT 15 22  ALKPHOS 79 63  BILITOT 2.6* 1.9*    Studies/Results: Ct Abdomen Pelvis Wo Contrast 09/28/2012     IMPRESSION:  1.  Small bowel obstruction with apparent change in caliber within the anterior right lower quadrant - right upper pelvis most likely due to an adhesion.  No bowel wall edema is seen. 2.  Small nonobstructing left upper pole renal calculi. 3.  Some fluid in the slightly distended distal esophagus may indicate reflux. 4.  Rectosigmoid and descending colon diverticula. 5.  Bilateral inguinal hernias containing fat.   Original Report Authenticated By: Ivar Drape, M.D.   Dg  Abd 2 Views 09/29/2012     IMPRESSION: Persistent small bowel obstruction with increased small bowel distention since previous exam. Recommend advancing nasogastric tube 8 cm to place proximal side port within stomach.   Original Report Authenticated By: Lavonia Dana, M.D.    ASSESMENT: * SBO, resolving. Surgery managing.  * Hx Crohns. No CT evidence of active SB or colonic disease. Stable quiescent sxs on Pentasa and Questran.  *  Urinary retention, Foley now removed.  * Persistent cough.  *  Hx of dysphagia, never had EGD in 2013. *  Hyperglycemia.  *  Mild increase total Bilirubin, this occurred as far back as 2008.  Transaminases normal.  Suspect Gilbert's syndrome.     PLAN: *  Has GI ROV with Dr Ardis Hughs on 8/18 0845 *  Since Solumedrol was empirically rx'd for possible Crohn's flare, and no active disease by CT, I am discontinuing this.  *  Start back on Questran, Pentasa, oral Protonix as soon as taking po's.    LOS: 2 days   [name]  09/30/2012, 10:08 AM Pager: [phone]  GI ATTENDING  Patient personally seen and examined. Interval history reviewed. Agree with H&P as outlined above. He seems to be doing better. Let us NG output and softer abdomen with more normal  bowel sounds. No tenderness. Passing flatus. Surgery as clamp NG tube. Bowel obstruction seems to be secondary to adhesions, not Crohn's. Agree with discontinuing Solu-Medrol. Resume GI medications upon discharge. Keep followup with Dr. Ardis Hughs in [place] available as needed. Will sign off. Discussed with patient, daughter, and wife  Docia Chuck. Geri Seminole., M.D. [facility] Division of Gastroenterology

## 2012-10-01 ENCOUNTER — Ambulatory Visit: Payer: Medicare Other | Admitting: Physical Therapy

## 2012-10-01 LAB — BASIC METABOLIC PANEL
BUN: 19 mg/dL (ref 6–23)
CO2: 26 mEq/L (ref 19–32)
Calcium: 7.8 mg/dL — ABNORMAL LOW (ref 8.4–10.5)
Chloride: 106 mEq/L (ref 96–112)
Creatinine, Ser: 1.06 mg/dL (ref 0.50–1.35)
GFR calc Af Amer: 74 mL/min — ABNORMAL LOW (ref >90)
GFR calc non Af Amer: 64 mL/min — ABNORMAL LOW (ref >90)
Glucose, Bld: 97 mg/dL (ref 70–99)
Potassium: 3.5 mEq/L (ref 3.5–5.1)
Sodium: 139 mEq/L (ref 135–145)

## 2012-10-01 LAB — CBC
HCT: 42.2 % (ref 39.0–52.0)
Hemoglobin: 13.6 g/dL (ref 13.0–17.0)
MCH: 30.6 pg (ref 26.0–34.0)
MCHC: 32.2 g/dL (ref 30.0–36.0)
MCV: 94.8 fL (ref 78.0–100.0)
Platelets: 197 10*3/uL (ref 150–400)
RBC: 4.45 MIL/uL (ref 4.22–5.81)
RDW: 13.4 % (ref 11.5–15.5)
WBC: 5 10*3/uL (ref 4.0–10.5)

## 2012-10-01 MED ORDER — LIP MEDEX EX OINT
1.0000 "application " | TOPICAL_OINTMENT | Freq: Two times a day (BID) | CUTANEOUS | Status: DC
  Administered 2012-10-01 – 2012-10-02 (×3): 1 via TOPICAL
  Filled 2012-10-01: qty 7

## 2012-10-01 MED ORDER — ALUM & MAG HYDROXIDE-SIMETH 200-200-20 MG/5ML PO SUSP: 30.0000 mL | Freq: Four times a day (QID) | ORAL | Status: DC | PRN

## 2012-10-01 MED ORDER — ISOSORBIDE MONONITRATE ER 30 MG PO TB24
30.0000 mg | ORAL_TABLET | Freq: Every day | ORAL | Status: DC
  Administered 2012-10-01 – 2012-10-02 (×2): 30 mg via ORAL
  Filled 2012-10-01 (×2): qty 1

## 2012-10-01 MED ORDER — CHOLESTYRAMINE 4 G PO PACK
1.0000 | PACK | Freq: Two times a day (BID) | ORAL | Status: DC
  Administered 2012-10-01 – 2012-10-02 (×3): 1 via ORAL
  Filled 2012-10-01 (×5): qty 1

## 2012-10-01 MED ORDER — BISACODYL 10 MG RE SUPP: 10.0000 mg | Freq: Two times a day (BID) | RECTAL | Status: DC | PRN

## 2012-10-01 MED ORDER — CARVEDILOL 6.25 MG PO TABS
6.2500 mg | ORAL_TABLET | Freq: Two times a day (BID) | ORAL | Status: DC
  Administered 2012-10-01 – 2012-10-02 (×3): 6.25 mg via ORAL
  Filled 2012-10-01 (×5): qty 1

## 2012-10-01 MED ORDER — PANTOPRAZOLE SODIUM 40 MG PO TBEC
40.0000 mg | DELAYED_RELEASE_TABLET | Freq: Every day | ORAL | Status: DC
  Administered 2012-10-01 – 2012-10-02 (×2): 40 mg via ORAL
  Filled 2012-10-01 (×2): qty 1

## 2012-10-01 MED ORDER — CARBIDOPA-LEVODOPA 25-100 MG PO TABS
1.0000 | ORAL_TABLET | Freq: Two times a day (BID) | ORAL | Status: DC
  Administered 2012-10-01 – 2012-10-02 (×3): 1 via ORAL
  Filled 2012-10-01 (×4): qty 1

## 2012-10-01 MED ORDER — TAMSULOSIN HCL 0.4 MG PO CAPS
0.4000 mg | ORAL_CAPSULE | Freq: Every day | ORAL | Status: DC
  Administered 2012-10-01 – 2012-10-02 (×2): 0.4 mg via ORAL
  Filled 2012-10-01 (×2): qty 1

## 2012-10-01 MED ORDER — MESALAMINE ER 250 MG PO CPCR
500.0000 mg | ORAL_CAPSULE | Freq: Three times a day (TID) | ORAL | Status: DC
  Administered 2012-10-02: 500 mg via ORAL
  Filled 2012-10-01 (×3): qty 2

## 2012-10-01 MED ORDER — MAGIC MOUTHWASH
15.0000 mL | Freq: Four times a day (QID) | ORAL | Status: DC | PRN
  Filled 2012-10-01: qty 15

## 2012-10-01 MED ORDER — SACCHAROMYCES BOULARDII 250 MG PO CAPS
250.0000 mg | ORAL_CAPSULE | Freq: Two times a day (BID) | ORAL | Status: DC
  Administered 2012-10-01 – 2012-10-02 (×3): 250 mg via ORAL
  Filled 2012-10-01 (×4): qty 1

## 2012-10-01 MED ORDER — NITROGLYCERIN 0.4 MG SL SUBL: 0.4000 mg | SUBLINGUAL_TABLET | SUBLINGUAL | Status: DC | PRN

## 2012-10-01 NOTE — Progress Notes (Signed)
Received order for rw.  [name] [name] spoke with the patient and he stated that he has one at home.  No DME needs at this time.

## 2012-10-01 NOTE — Progress Notes (Signed)
Agree with above Try low fat diet in AM Probiotic as well

## 2012-10-01 NOTE — Progress Notes (Signed)
[name], male   DOB: 1928-06-06, 79 y.o.   MRN: [id number]    Subjective: Pt feels great except diarrhea.  He is being restarted on his qestran.  Objective: Vital signs in last 24 hours: Temp:  [97.7 F (36.5 C)-98.6 F (37 C)] 97.7 F (36.5 C) (06/26 0536) Pulse Rate:  [78-83] 81 (06/26 0536) Resp:  [18] 18 (06/26 0536) BP: (130-140)/(70-80) 140/78 mmHg (06/26 0536) SpO2:  [94 %-98 %] 95 % (06/26 0536) Last BM Date: 09/30/12  Intake/Output from previous day: 06/25 0701 - 06/26 0700 In: 3120 [P.O.:840; I.V.:2280] Out: 500 [Urine:500] Intake/Output this shift:    PE: Abd: soft, NT, ND, +BS  Lab Results:   Recent Labs  10-11-12 0440 10/01/12 0541  WBC 5.8 5.0  HGB 15.5 13.6  HCT 46.5 42.2  PLT 209 197   BMET  Recent Labs  2012/10/11 0440 10/01/12 0541  NA 137 139  K 4.2 3.5  CL 99 106  CO2 29 26  GLUCOSE 162* 97  BUN 27* 19  CREATININE 1.32 1.06  CALCIUM 9.3 7.8*   PT/INR  Recent Labs  10-11-2012 0440  LABPROT 13.8  INR 1.07   CMP     Component Value Date/Time   NA 139 10/01/2012 0541   K 3.5 10/01/2012 0541   CL 106 10/01/2012 0541   CO2 26 10/01/2012 0541   GLUCOSE 97 10/01/2012 0541   BUN 19 10/01/2012 0541   CREATININE 1.06 10/01/2012 0541   CALCIUM 7.8* 10/01/2012 0541   PROT 6.7 2012/10/11 0440   ALBUMIN 3.3* October 11, 2012 0440   AST 16 Oct 11, 2012 0440   ALT 22 10/11/12 0440   ALKPHOS 63 10-11-12 0440   BILITOT 1.9* October 11, 2012 0440   GFRNONAA 64* 10/01/2012 0541   GFRAA 74* 10/01/2012 0541   Lipase     Component Value Date/Time   LIPASE 23 06/02/2011 1535       Studies/Results: Dg Abd 2 Views  2012-10-11   *RADIOLOGY REPORT*  Clinical Data: Small bowel obstruction, follow-up  ABDOMEN - 2 VIEW  Comparison: CT abdomen and pelvis 09/28/2012, abdominal radiographs 09/28/2012  Findings: Persistent small bowel dilatation compatible with high-grade small bowel obstruction. Paucity of colonic gas. Small bowel loops measure up to 7.6  cm diameter, previously 6.3 cm maximum. No definite bowel wall thickening or free intraperitoneal air. Surgical clips right upper quadrant consistent with cholecystectomy. Prior lumbar fusion. Tip of nasogastric tube at GE junction, recommend advancing tube 8 cm. Bones demineralized.  IMPRESSION: Persistent small bowel obstruction with increased small bowel distention since previous exam. Recommend advancing nasogastric tube 8 cm to place proximal side port within stomach.   Original Report Authenticated By: Lavonia Dana, M.D.    Anti-infectives: Anti-infectives   None       Assessment/Plan  1. PSBO, resolving  Plan: 1. Agree with full liquid diet today.  I have advanced him to regular diet in the morning if he tolerates his full liquids today.  If he tolerates this in the am, then suspect he should be stable for dc home tomorrow.  Will follow.   LOS: 3 days    Lakea Mittelman E 10/01/2012, 8:36 AM Pager: [phone]

## 2012-10-01 NOTE — Progress Notes (Signed)
Subjective: Improving SBO. Surgery clamped the NGT yest.  NGT out.  No bladder issues.  Stronger than expected GI stopped the Solumedrol. Labs are fine today + Diarrhea now  Objective: Vital signs in last 24 hours: Temp:  [97.7 F (36.5 C)-98.6 F (37 C)] 97.7 F (36.5 C) (06/26 0536) Pulse Rate:  [78-83] 81 (06/26 0536) Resp:  [18] 18 (06/26 0536) BP: (130-140)/(70-80) 140/78 mmHg (06/26 0536) SpO2:  [94 %-98 %] 95 % (06/26 0536) Weight change:  Last BM Date: 09/30/12  CBG (last 3)  No results found for this basename: GLUCAP,  in the last 72 hours  Intake/Output from previous day:  Intake/Output Summary (Last 24 hours) at 10/01/12 0649 Last data filed at 10/01/12 0606  Gross per 24 hour  Intake   1665 ml  Output    500 ml  Net   1165 ml   06/25 0701 - 06/26 0700 In: 0277 [P.O.:600; I.V.:1065] Out: 500 [Urine:500]   Physical Exam  General appearance: Looks better than he ought too. Eyes: no scleral icterus Throat: oropharynx moist without erythema Resp: CTA Cardio: Reg.  1/6 m. GI: Soft/NT/BS doing well Extremities: no clubbing, cyanosis or edema.  SCDs on.   Lab Results:  Recent Labs  02-Oct-2012 0440 10/01/12 0541  NA 137 139  K 4.2 3.5  CL 99 106  CO2 29 26  GLUCOSE 162* 97  BUN 27* 19  CREATININE 1.32 1.06  CALCIUM 9.3 7.8*     Recent Labs  09/28/12 1245 2012/10/02 0440  AST 21 16  ALT 15 22  ALKPHOS 79 63  BILITOT 2.6* 1.9*  PROT 7.8 6.7  ALBUMIN 4.1 3.3*     Recent Labs  09/28/12 1245 10/02/12 0440 10/01/12 0541  WBC 9.1 5.8 5.0  NEUTROABS 6.5  --   --   HGB 17.2* 15.5 13.6  HCT 49.7 46.5 42.2  MCV 93.1 93.4 94.8  PLT 229 209 197    Lab Results  Component Value Date   INR 1.07 2012/10/02   INR 1.0 12/14/2010    No results found for this basename: CKTOTAL, CKMB, CKMBINDEX, TROPONINI,  in the last 72 hours  No results found for this basename: TSH, T4TOTAL, FREET3, T3FREE, THYROIDAB,  in the last 72 hours  No results  found for this basename: VITAMINB12, FOLATE, FERRITIN, TIBC, IRON, RETICCTPCT,  in the last 72 hours  Micro Results: No results found for this or any previous visit (from the past 240 hour(s)).   Studies/Results: Dg Abd 2 Views  Oct 02, 2012   *RADIOLOGY REPORT*  Clinical Data: Small bowel obstruction, follow-up  ABDOMEN - 2 VIEW  Comparison: CT abdomen and pelvis 09/28/2012, abdominal radiographs 09/28/2012  Findings: Persistent small bowel dilatation compatible with high-grade small bowel obstruction. Paucity of colonic gas. Small bowel loops measure up to 7.6 cm diameter, previously 6.3 cm maximum. No definite bowel wall thickening or free intraperitoneal air. Surgical clips right upper quadrant consistent with cholecystectomy. Prior lumbar fusion. Tip of nasogastric tube at GE junction, recommend advancing tube 8 cm. Bones demineralized.  IMPRESSION: Persistent small bowel obstruction with increased small bowel distention since previous exam. Recommend advancing nasogastric tube 8 cm to place proximal side port within stomach.   Original Report Authenticated By: Lavonia Dana, M.D.     Medications: Scheduled: . enoxaparin (LOVENOX) injection  40 mg Subcutaneous Q24H  . pantoprazole (PROTONIX) IV  40 mg Intravenous Q24H   Continuous: . sodium chloride 100 mL/hr at 10/01/12 4128     Assessment/Plan:  Active Problems:   HYPERTENSION   CAD   CROHN'S DISEASE-SMALL INTESTINE   SBO (small bowel obstruction)   Dehydration   Acute kidney injury  Recurrent SBO - due to Adhesions, possible stricture at anastomosis due to prior surgery, small hernia related, versus Active Crohn's flare. Adhesions seems most likely.  Rx =  NPO, Bowel Rest, NGT, Fluids, Steroids for possible Crohns flare. Pain and nausea controlled.  He is passing gas and BMs.  He did well with bowel clamping.  I Appreciate Surgery and GI consults. Diet and NGT being managed by surgery.  Did well with clamping yesterday and NGT is out.   Anticipate further improvement today.  U Retention - Foley out. Bladder scanning ordered. flomax when tolerating POs.  LBP - Better Hypertension - restart BP meds  Crohn's disease c h/o Distant ileal and right colon resection on chronic Pentassa. Dr Oretha Caprice. On Cholestyramine for stool bulk.  * Has GI ROV with Dr Ardis Hughs on 8/18 0845  * Since Solumedrol was empirically rx'd for possible Crohn's flare, and no active disease by CT - it was discontined  * Start back on Questran, Pentasa, oral Protonix as soon as taking po's.   GERD - change to PO PPI PD - Restart meds  DVT Prophylaxis - TED Hose/Squeezers/lovenox   Today restart Home meds. I increased to full liq - expect better diet per Surg, anticipate d/c 1-2 days.    LOS: 3 days   Cheril Slattery M 10/01/2012, 6:49 AM

## 2012-10-02 ENCOUNTER — Inpatient Hospital Stay (HOSPITAL_COMMUNITY): Payer: Medicare Other

## 2012-10-02 NOTE — Discharge Summary (Signed)
Physician Discharge Summary  DISCHARGE SUMMARY   [name] MR#: [id number] DOB/AGE: [birth year] 77 y.o.   Attending 49 M  Patient's DGL:OVFIE,PPIR M, MD  Consults: * GI  Admit date: 09/28/2012 Discharge date: 10/02/2012  Discharge Diagnoses:  Active Problems:   HYPERTENSION   CAD   CROHN'S DISEASE-SMALL INTESTINE   SBO (small bowel obstruction)   Dehydration   Acute kidney injury   Patient Active Problem List   Diagnosis Date Noted  . Dehydration 09/28/2012  . Acute kidney injury 09/28/2012  . Memory loss 08/19/2012  . Paralysis agitans 08/19/2012  . Abnormality of gait 08/19/2012  . SBO (small bowel obstruction) 06/04/2011  . Dysphagia 05/24/2011  . Tinea corporis 01/04/2011  . Angina of effort 12/14/2010  . Fatigue 12/14/2010  . NOSEBLEED 05/23/2010  . ABNORMAL FINDINGS GI TRACT 10/20/2009  . NONSPECIFIC ABNORMAL RESULTS LIVR FUNCTION STUDY 10/20/2009  . CROHN'S [facility] INTESTINE 03/16/2008  . HYPERLIPIDEMIA 04/29/2007  . HYPERTENSION 04/29/2007  . CAD 04/29/2007  . GERD 04/29/2007  . CROHN'S DISEASE 04/29/2007  . SMALL BOWEL OBSTRUCTION 04/29/2007  . DIVERTICULOSIS, COLON 04/29/2007  . FATTY LIVER DISEASE 08/30/2006  . HEMORRHOIDS 03/26/2005  . ULCERATIVE ILEOCOLITIS 12/11/1999  . EROSIVE ESOPHAGITIS 09/29/1997  . HIATAL HERNIA 09/29/1997   Past Medical History  Diagnosis Date  . Hypertension   . Hyperlipidemia   . Coronary artery disease     s/p ANTERIOR STEMI 1/09 at [facility]; treated with a bare-metal stent to the LAD; Myoview 11/11 EF 71%, no scar, no ischemia;     cath 12/20/10:   pLAD 30-40%, stent ok, ? If stent oversized, EF 60% and low LVEDP  . GERD (gastroesophageal reflux disease)   . Other esophagitis   . Diaphragmatic hernia without mention of obstruction or gangrene   . Ulcerative (chronic) ileocolitis   . Diverticulosis of colon (without mention of hemorrhage)   . Other chronic nonalcoholic  liver disease   . Regional enteritis of small intestine   . Flatulence, eructation, and gas pain   . Ischemic cardiomyopathy     EF initially 35-40% after MI 1/09; echo 7/08: EF 60%  . Fatty liver     on ultrasound of 10/2009  . Parkinson's disease   . Gait disorder   . Memory disorder   . Osteoporosis   . Renal calculi   . Melanoma     Discharged Condition: Stable  Discharge Medications:   Medication List    TAKE these medications       acetaminophen 325 MG tablet  Commonly known as:  TYLENOL  Take 650 mg by mouth every 6 (six) hours as needed for pain.     ANDROGEL PUMP TD  Place 2 application onto the skin daily. 2 pumps once daily       aspirin EC 81 MG tablet  Take 81 mg by mouth daily.     atorvastatin 40 MG tablet  Commonly known as:  LIPITOR  Take 40 mg by mouth daily.     carbidopa-levodopa 25-100 MG per tablet  Commonly known as:  SINEMET IR  One tablet in the morning and midday, and one half tablet at night     carvedilol 6.25 MG tablet  Commonly known as:  COREG  Take 6.25 mg by mouth 2 (two) times daily with a meal.     cholecalciferol 1000 UNITS tablet  Commonly known as:  VITAMIN D  Take 2,000 Units by mouth daily.     cholestyramine 4  G packet  Commonly known as:  QUESTRAN  Take 1 packet by mouth 2 (two) times daily with a meal.     cyanocobalamin 1000 MCG tablet  Take 100 mcg by mouth daily.     isosorbide mononitrate 30 MG 24 hr tablet  Commonly known as:  IMDUR  TAKE ONE TABLET BY MOUTH ONCE DAILY     lansoprazole 30 MG capsule  Commonly known as:  PREVACID  Take 30 mg by mouth daily.     nitroGLYCERIN 0.4 MG SL tablet  Commonly known as:  NITROSTAT  Place 0.4 mg under the tongue every 5 (five) minutes as needed. For chest pain.     PENTASA 250 MG CR capsule  Generic drug:  mesalamine  TAKE TWO CAPSULES THREE TIMES DAILY     Potassium Bicarbonate 99 MG Caps  Take 1 capsule by mouth daily.     pyridOXINE 100 MG tablet   Commonly known as:  VITAMIN B-6  Take 100 mg by mouth daily.        Hospital Procedures: Ct Abdomen Pelvis Wo Contrast  09/28/2012   *RADIOLOGY REPORT*  Clinical Data: Nausea, history of prior bowel resection and kidney stones  CT ABDOMEN AND PELVIS WITHOUT CONTRAST  Technique:  Multidetector CT imaging of the abdomen and pelvis was performed following the standard protocol without intravenous contrast.  Comparison: Abdomen plain films of 09/28/2012 and CT abdomen pelvis of 06/02/2011  Findings: The lung bases are clear.  There is some fluid within a slightly distended distal esophagus probably due to reflux.  The liver is unremarkable in the unenhanced state.  Surgical clips are present from prior cholecystectomy.  The pancreas appears relatively atrophic.  The adrenal glands and spleen are unremarkable.  The stomach is fluid distended with probable food debris layering near the fundus.  No hydronephrosis is seen.  There do appear to be a few small nonobstructing calculi within the upper pole of the left kidney.  The abdominal aorta is normal in caliber. No adenopathy is noted.  However, there are dilated loops of small bowel throughout the abdomen pelvis.  The distal ileum appears decompressed, with an apparent change in caliber where there is kinking of small bowel within the right lower quadrant - right mid pelvis anteriorly.  No mass is seen and this kinking and abrupt change in caliber would suggest the presence of adhesions.  No free fluid is seen within the pelvis.  The prostate is within normal limits in size for age.  A Foley catheter is within the urinary bladder.  There are multiple rectosigmoid and descending colon diverticula present.  It appears that the patient has undergone prior right hemicolectomy and the anastomosis in the right abdomen is unremarkable.  Bilateral inguinal hernias are present containing fat. Hardware for posterior fusion from L3-S1 is noted.  There appears to be solid  fusion at L3- 4 and L5- S1 with ray cage fusion at L4-5.  IMPRESSION:  1.  Small bowel obstruction with apparent change in caliber within the anterior right lower quadrant - right upper pelvis most likely due to an adhesion.  No bowel wall edema is seen. 2.  Small nonobstructing left upper pole renal calculi. 3.  Some fluid in the slightly distended distal esophagus may indicate reflux. 4.  Rectosigmoid and descending colon diverticula. 5.  Bilateral inguinal hernias containing fat.   Original Report Authenticated By: Ivar Drape, M.D.   Dg Abd 2 Views  09/29/2012   *RADIOLOGY REPORT*  Clinical Data:  Small bowel obstruction, follow-up  ABDOMEN - 2 VIEW  Comparison: CT abdomen and pelvis 09/28/2012, abdominal radiographs 09/28/2012  Findings: Persistent small bowel dilatation compatible with high-grade small bowel obstruction. Paucity of colonic gas. Small bowel loops measure up to 7.6 cm diameter, previously 6.3 cm maximum. No definite bowel wall thickening or free intraperitoneal air. Surgical clips right upper quadrant consistent with cholecystectomy. Prior lumbar fusion. Tip of nasogastric tube at GE junction, recommend advancing tube 8 cm. Bones demineralized.  IMPRESSION: Persistent small bowel obstruction with increased small bowel distention since previous exam. Recommend advancing nasogastric tube 8 cm to place proximal side port within stomach.   Original Report Authenticated By: Lavonia Dana, M.D.   Dg Abd Acute W/chest  09/28/2012   *RADIOLOGY REPORT*  Clinical Data: Abdominal pain.  Nausea and vomiting.  Previous surgery.  ACUTE ABDOMEN SERIES (ABDOMEN 2 VIEW & CHEST 1 VIEW)  Comparison: 06/04/2011  Findings: There are markedly dilated fluid and air filled loops of small intestine consistent with small bowel obstruction.  No free air.  Previous spinal surgery is noted.  Previous cholecystectomy.  One-view chest shows normal heart and mediastinal shadows.  There is mild atelectasis at the left base.   No free air.  IMPRESSION: Markedly dilated fluid and air filled loops of small intestine consistent with small bowel obstruction.   Original Report Authenticated By: Nelson Chimes, M.D.    History of Present Illness: 40 M c H/O CAD, Crohn's, and multiple med issues. Admitted c his 2nd SBO and one recent sig Ileus. He was seen in ED with Ab pain and difficulty Voiding 6/22. Foley was placed and he had @ 1 L UOP and was D/Ced home. He did poorly at home with some N/V. He had Mult BMs. He presented to med attention again on 6/23 with weakness, FTT and Ab pains. AAS confirmed markedly dilated fluid and air filled loops of small intestine consistent with small bowel obstruction. CT obtained and showed Small bowel obstruction with apparent change in caliber within the anterior right lower quadrant - right upper pelvis most likely due to an adhesion. No bowel wall edema is seen. I was called to admit. Surgery was called to consult. His GI Doc is Dr Ardis Hughs. NGT was placed  Hospital Course: Admitted 6/23 c recurrent SBO. NGT placed.  Gen surgery and GI were consulted. Solumedrol given for possibility of Crohns flare. Recurrent SBO was considered due to Adhesions, possible stricture at anastomosis due to prior surgery, small hernia related versus Active Crohn's flare. He was treated via NPO, Bowel Rest, NGT, Fluids, Steroids and recovered nicely over the next 2-4 days. Pain and nausea Rx ordered. As he recovered his NGT was clamped and eventually D/Ced - We advanced his diet without issues.  He clinically did well.  Oral meds were restarted back on 10/01/12.  The most likely cause based on hospital course was probably due to Adhesions.  He did not need surgery. Continue to advance diet and work on D/C = later today vrs tomorrow?   U Retention - Foley out. Bladder scanning Fine. Short term flomax provided in hospital and does not need as outpt.  His developing Ileus/SBO probably caused the U Retention..   Leukocytosis  and AKI resolved with conservative rx.  DeH c increased Cr and hemoconcentration on admission that IV Fluids fixed.  Protein Cal Malnutrition as evidenced from low albumin = 3.3. Encourage nutrition and monitor as outpt his stores.  LBP - may have been early SBO or  the Tramadol/Muscle relaxors promoted Constipation triggering the ileus? No current issue now and no meds needed. CAD- status post anterior ST elevation myocardial infarction treated with bare-metal stent to the LAD at [facility] in [place] in 1/09. Last cath by Dr. Johnsie Cancel on 9/13: pLAD 30-40%, stent ok, ? If stent oversized, EF 60% and low LVEDP. Medical therapy continued.  Ischmeic CM c EF 35-40% improved to 60% with med management. Per Dr Haroldine Laws  Hypertension - BP perfect back on BP meds  Crohn's disease c h/o Distant ileal and right colon resection on chronic Pentassa. Dr Oretha Caprice. On Cholestyramine for stool bulk.  * Has GI ROV with Dr Ardis Hughs on 8/18 @ 0845  * Since Solumedrol was empirically rx'd for possible Crohn's flare, and no active disease by CT - it was discontined  * Back on Questran, Pentasa, oral Protonix.   GERD - Oral PPI  PD - back on meds  DVT Prophylaxis was provided by TED Hose/Squeezers/lovenox .  He did well c PT and no HH needs were identified.  He tolerated solids and walked well. He does not need Infirm[facility]. OK for D/c at 1pm on 6.27.     Day of Discharge Exam BP 121/69  Pulse 70  Temp(Src) 97.8 F (36.6 C) (Oral)  Resp 16  Ht 5' 9.5" (1.765 m)  Wt 76.7 kg (169 lb 1.5 oz)  BMI 24.62 kg/m2  SpO2 99%  Physical Exam: See PN  Discharge Labs:  Recent Labs  10/01/12 0541  NA 139  K 3.5  CL 106  CO2 26  GLUCOSE 97  BUN 19  CREATININE 1.06  CALCIUM 7.8*   No results found for this basename: AST, ALT, ALKPHOS, BILITOT, PROT, ALBUMIN,  in the last 72 hours  Recent Labs  10/01/12 0541  WBC 5.0  HGB 13.6  HCT 42.2  MCV 94.8  PLT 197   No results found for this basename:  CKTOTAL, CKMB, CKMBINDEX, TROPONINI,  in the last 72 hours No results found for this basename: TSH, T4TOTAL, FREET3, T3FREE, THYROIDAB,  in the last 72 hours No results found for this basename: VITAMINB12, FOLATE, FERRITIN, TIBC, IRON, RETICCTPCT,  in the last 72 hours Lab Results  Component Value Date   INR 1.07 09/29/2012   INR 1.0 12/14/2010       Discharge instructions:  Future Appointments Provider Department Dept Phone   11/23/2012 8:45 AM [name], MD [facility] [phone]     01-Home or Self Care Follow-up Information   Follow up with Owens Loffler, MD On 11/23/2012. (8:45 AM appointment. )    Contact information:   [address] [place] [address] [phone]       Follow up with [name], MD In 14 days.   Contact information:   [facility] [place] [address] [phone]        Disposition: Home @ [facility]  Follow-up Appts: Follow-up with Dr. Virgina Jock at [facility] in @ 2 weeks.  Call for appointment.  Condition on Discharge: stable  Tests Needing Follow-up: None  Time spent in discharge (includes decision making & examination of pt): 28 min  Signed: Laray Corbit M 10/02/2012, 12:57 PM

## 2012-10-02 NOTE — Progress Notes (Signed)
Agree with above.    Diet as tolerated.

## 2012-10-02 NOTE — Progress Notes (Signed)
[name], male   DOB: 03/08/1929, 79 y.o.   MRN: [id number]    Subjective: Pt feels well.  Maybe slightly bloated, but tolerating a full liquid diet with no nausea.  He continues to have BMs  Objective: Vital signs in last 24 hours: Temp:  [97.5 F (36.4 C)-97.8 F (36.6 C)] 97.8 F (36.6 C) (06/27 0503) Pulse Rate:  [61-85] 70 (06/27 0503) Resp:  [16-18] 16 (06/27 0503) BP: (101-132)/(56-69) 121/69 mmHg (06/27 0503) SpO2:  [94 %-99 %] 99 % (06/27 0503) Last BM Date: 10/01/12  Intake/Output from previous day: 06/26 0701 - 06/27 0700 In: 2208 [P.O.:1440; I.V.:768] Out: 200 [Urine:200] Intake/Output this shift:    PE: Abd: soft, but a little distended (which he says is his norm), +BS, NT  Lab Results:   Recent Labs  10/01/12 0541  WBC 5.0  HGB 13.6  HCT 42.2  PLT 197   BMET  Recent Labs  10/01/12 0541  NA 139  K 3.5  CL 106  CO2 26  GLUCOSE 97  BUN 19  CREATININE 1.06  CALCIUM 7.8*   PT/INR No results found for this basename: LABPROT, INR,  in the last 72 hours CMP     Component Value Date/Time   NA 139 10/01/2012 0541   K 3.5 10/01/2012 0541   CL 106 10/01/2012 0541   CO2 26 10/01/2012 0541   GLUCOSE 97 10/01/2012 0541   BUN 19 10/01/2012 0541   CREATININE 1.06 10/01/2012 0541   CALCIUM 7.8* 10/01/2012 0541   PROT 6.7 09/29/2012 0440   ALBUMIN 3.3* 09/29/2012 0440   AST 16 09/29/2012 0440   ALT 22 09/29/2012 0440   ALKPHOS 63 09/29/2012 0440   BILITOT 1.9* 09/29/2012 0440   GFRNONAA 64* 10/01/2012 0541   GFRAA 74* 10/01/2012 0541   Lipase     Component Value Date/Time   LIPASE 23 06/02/2011 1535       Studies/Results: No results found.  Anti-infectives: Anti-infectives   None       Assessment/Plan  1. PSBO, resolving 2. H/o crohn's disease  Plan: 1. If patient tolerates solid diet then surgically he is stable to be dc per primary service.  No surgical intervention needed.  We will sign off.  Please call back as needed if  further problems arise.   LOS: 4 days    Sanjana Folz E 10/02/2012, 8:16 AM Pager: [phone]

## 2012-10-02 NOTE — Progress Notes (Signed)
Subjective: Improved SBO. Tolerating Liq Feels back to Normal. Stools more bulk with the Questran. No GI issues.  No Nausea. He and I feel he can go back home and not to [facility].  Objective: Vital signs in last 24 hours: Temp:  [97.5 F (36.4 C)-97.8 F (36.6 C)] 97.8 F (36.6 C) (06/27 0503) Pulse Rate:  [61-85] 70 (06/27 0503) Resp:  [16-18] 16 (06/27 0503) BP: (101-132)/(56-69) 121/69 mmHg (06/27 0503) SpO2:  [94 %-99 %] 99 % (06/27 0503) Weight change:  Last BM Date: 10/01/12  CBG (last 3)  No results found for this basename: GLUCAP,  in the last 72 hours  Intake/Output from previous day:  Intake/Output Summary (Last 24 hours) at 10/02/12 0641 Last data filed at 10/02/12 0400  Gross per 24 hour  Intake   3008 ml  Output    200 ml  Net   2808 ml   06/26 0701 - 06/27 0700 In: 2208 [P.O.:1440; I.V.:768] Out: 200 [Urine:200]   Physical Exam  General appearance: Looks better than he ought too. Resp: CTA Cardio: Reg.  1/6 m. GI: Soft/NT/BS doing well Extremities: no clubbing, cyanosis or edema.  SCDs on.   Lab Results:  Recent Labs  10/01/12 0541  NA 139  K 3.5  CL 106  CO2 26  GLUCOSE 97  BUN 19  CREATININE 1.06  CALCIUM 7.8*    No results found for this basename: AST, ALT, ALKPHOS, BILITOT, PROT, ALBUMIN,  in the last 72 hours   Recent Labs  10/01/12 0541  WBC 5.0  HGB 13.6  HCT 42.2  MCV 94.8  PLT 197    Lab Results  Component Value Date   INR 1.07 09/29/2012   INR 1.0 12/14/2010    No results found for this basename: CKTOTAL, CKMB, CKMBINDEX, TROPONINI,  in the last 72 hours  No results found for this basename: TSH, T4TOTAL, FREET3, T3FREE, THYROIDAB,  in the last 72 hours  No results found for this basename: VITAMINB12, FOLATE, FERRITIN, TIBC, IRON, RETICCTPCT,  in the last 72 hours  Micro Results: No results found for this or any previous visit (from the past 240 hour(s)).   Studies/Results: No results  found.   Medications: Scheduled: . carbidopa-levodopa  1 tablet Oral BID  . carvedilol  6.25 mg Oral BID WC  . cholestyramine  1 packet Oral BID WC  . enoxaparin (LOVENOX) injection  40 mg Subcutaneous Q24H  . isosorbide mononitrate  30 mg Oral Daily  . lip balm  1 application Topical BID  . mesalamine  500 mg Oral TID  . pantoprazole  40 mg Oral Daily  . saccharomyces boulardii  250 mg Oral BID  . tamsulosin  0.4 mg Oral Daily   Continuous:     Assessment/Plan: Active Problems:   HYPERTENSION   CAD   CROHN'S DISEASE-SMALL INTESTINE   SBO (small bowel obstruction)   Dehydration   Acute kidney injury  Recurrent SBO - due to Adhesions, possible stricture at anastomosis due to prior surgery, small hernia related, versus Active Crohn's flare. Adhesions seems most likely.  Either way he is resolving nicely and did not need surgery.  Advance diet and work on D/C = later today vrs tomorrow?  U Retention - Foley out. Bladder scanning Fine. Short term flomax.  LBP - Better Hypertension - BP perfect back on BP meds  Crohn's disease c h/o Distant ileal and right colon resection on chronic Pentassa. Dr Oretha Caprice. On Cholestyramine for stool bulk.  *  Has GI ROV with Dr Ardis Hughs on 8/18 @ 0845  * Since Solumedrol was empirically rx'd for possible Crohn's flare, and no active disease by CT - it was discontined  * Back on Questran, Pentasa, oral Protonix.   GERD - Oral PPI PD - back on meds  DVT Prophylaxis - TED Hose/Squeezers/lovenox   Today advance diet and work towards D/C. He and I feel he can go back home and not to [facility].     LOS: 4 days   Corbitt Cloke M 10/02/2012, 6:41 AM

## 2012-10-19 ENCOUNTER — Ambulatory Visit: Payer: Medicare Other | Attending: Neurology | Admitting: Physical Therapy

## 2012-10-19 DIAGNOSIS — G20A1 Parkinson's disease without dyskinesia, without mention of fluctuations: Secondary | ICD-10-CM | POA: Insufficient documentation

## 2012-10-19 DIAGNOSIS — R269 Unspecified abnormalities of gait and mobility: Secondary | ICD-10-CM | POA: Insufficient documentation

## 2012-10-19 DIAGNOSIS — IMO0001 Reserved for inherently not codable concepts without codable children: Secondary | ICD-10-CM | POA: Insufficient documentation

## 2012-10-19 DIAGNOSIS — G20 Parkinson's disease: Secondary | ICD-10-CM | POA: Insufficient documentation

## 2012-10-26 ENCOUNTER — Ambulatory Visit: Payer: Medicare Other | Admitting: Physical Therapy

## 2012-10-28 ENCOUNTER — Other Ambulatory Visit: Payer: Self-pay | Admitting: Dermatology

## 2012-11-02 ENCOUNTER — Other Ambulatory Visit: Payer: Self-pay

## 2012-11-02 MED ORDER — CARBIDOPA-LEVODOPA 25-100 MG PO TABS: ORAL_TABLET | ORAL | Status: DC

## 2012-11-06 ENCOUNTER — Ambulatory Visit: Payer: Medicare Other | Admitting: Gastroenterology

## 2012-11-23 ENCOUNTER — Encounter: Payer: Self-pay | Admitting: Gastroenterology

## 2012-11-23 ENCOUNTER — Ambulatory Visit (INDEPENDENT_AMBULATORY_CARE_PROVIDER_SITE_OTHER): Payer: Medicare Other | Admitting: Gastroenterology

## 2012-11-23 VITALS — BP 100/60 | HR 88 | Ht 69.5 in | Wt 169.0 lb

## 2012-11-23 DIAGNOSIS — K5090 Crohn's disease, unspecified, without complications: Secondary | ICD-10-CM

## 2012-11-23 DIAGNOSIS — K5000 Crohn's disease of small intestine without complications: Secondary | ICD-10-CM

## 2012-11-23 NOTE — Patient Instructions (Addendum)
Please return to see Dr. Ardis Hughs in 12 months, sooner if needed. Stay on pentasa and cholestyramine daily as previously prescribed.

## 2012-11-23 NOTE — Progress Notes (Signed)
GI PROBLEM LIST:  1. Crohn's disease. Distant ileal and right colon resection by Dr. Gretta Cool in the very distant past. He was maintained on Pentasa, Entocort, and 100m of Purinethol for several years under the care of Dr. SLyla Son Hospitalization, May 2008, for acute myocardial infarction complicated by small bowel obstruction likely due to active Crohn's. Increased Purinethol to 100 mg daily and liver tests increased as well. Purinethol held and liver test normalized. The patient felt much better overall as well (less diarrhea, less fatigue). Winter, 2009: currently on 6 pills of Pentasa day feeling quite well overall. Summer, 2010 postoperative ileus following spine surgery (doubt active Crohn's flare). November, 2011: started cholestyramine with very good results (loose stools MUCH improved, only going 3-4 times a day)  2. Dysphagia February 2012: Barium esophagram showed mild narrowing at his GE junction. Was planning to perform dilation off Plavix (if okay with his cardiologist) when he had a bowel obstruction.  3. partial small bowel obstruction, February 2012. CT suggested transition point in the pelvis. Not clear if there was active Crohn's disease contributing to this, however he was put on prednisone in hosp; started 381ma day, taper off quickly.  Recurrent obstruction 09/2012: CT scan IMPRESSION: Small bowel obstruction with apparent change in caliber within the anterior right lower quadrant - right upper pelvis most likely due to an adhesion.  Was admitted to hosp, put on steroids empirically however seems more likely to have been adhesive related than due to active IBD.  HPI: This is a  very pleasant [age]ear old man.  I last saw in the office about 8-10 months ago. He was admitted to the hospital with small bowel obstruction 2 months ago.  Was in hosp for 5 nights.    He has been fine since leaving the hosp.  Tapered off prednisone.  Has been off for several weeks now.  Excercises  fairly regularly.  Has BMs in AM, about 4 times per day total.  He continues to take Pentasa and cholestyramine daily. This has been his regimen for several years   Past Medical History  Diagnosis Date  . Hypertension   . Hyperlipidemia   . Coronary artery disease     [facility] [facility] [facility]; Myoview 11/11 EF 71%, no scar, no ischemia;     cath 12/20/10:   pLAD 30-40%, stent ok, ? If stent oversized, EF 60% and low LVEDP  . GERD (gastroesophageal reflux disease)   . Other esophagitis   . Diaphragmatic hernia without mention of obstruction or gangrene   . Ulcerative (chronic) ileocolitis   . Diverticulosis of colon (without mention of hemorrhage)   . Other chronic nonalcoholic liver disease   . Regional enteritis of small intestine   . Flatulence, eructation, and gas pain   . Ischemic cardiomyopathy     EF initially 35-40% after MI 1/09; echo 7/08: EF 60%  . Fatty liver     on ultrasound of 10/2009  . Parkinson's disease   . Gait disorder   . Memory disorder   . Osteoporosis   . Renal calculi   . Melanoma     Past Surgical History  Procedure Laterality Date  . Cardiac catheterization  09/25/06    EF 35-40% but more recently 60%  . Cholecystectomy    . Appendectomy    . Back surgery      L3, L4, L5  . Tonsillectomy    . Partial  bowel resection    . Coronary artery stent placement    . Melanoma resection    . Cataract extraction      Bilateral    Current Outpatient Prescriptions  Medication Sig Dispense Refill  . acetaminophen (TYLENOL) 325 MG tablet Take 650 mg by mouth every 6 (six) hours as needed for pain.      Marland Kitchen aspirin EC 81 MG tablet Take 81 mg by mouth daily.      Marland Kitchen atorvastatin (LIPITOR) 40 MG tablet Take 40 mg by mouth daily.        . calcium acetate (PHOSLO) 667 MG capsule Take 667 mg by mouth 3 (three) times daily with meals.      . carbidopa-levodopa (SINEMET IR) 25-100 MG per tablet One tablet in  the morning and midday, and one half tablet at night  75 tablet  6  . carvedilol (COREG) 6.25 MG tablet Take 6.25 mg by mouth 2 (two) times daily with a meal.      . cholecalciferol (VITAMIN D) 1000 UNITS tablet Take 2,000 Units by mouth daily.      . cholestyramine (QUESTRAN) 4 G packet Take 1 packet by mouth 2 (two) times daily with a meal.  60 each  11  . cyanocobalamin 1000 MCG tablet Take 100 mcg by mouth daily.      . isosorbide mononitrate (IMDUR) 30 MG 24 hr tablet TAKE ONE TABLET BY MOUTH ONCE DAILY  30 tablet  6  . lansoprazole (PREVACID) 30 MG capsule Take 30 mg by mouth daily.      Marland Kitchen lisinopril (PRINIVIL,ZESTRIL) 10 MG tablet       . nitroGLYCERIN (NITROSTAT) 0.4 MG SL tablet Place 0.4 mg under the tongue every 5 (five) minutes as needed. For chest pain.      Marland Kitchen PENTASA 250 MG CR capsule TAKE TWO CAPSULES THREE TIMES DAILY  180 capsule  6  . Potassium Bicarbonate 99 MG CAPS Take 1 capsule by mouth daily.      . Testosterone (ANDROGEL PUMP TD) Place 2 application onto the skin daily. 2 pumps once daily       . pyridOXINE (VITAMIN B-6) 100 MG tablet Take 100 mg by mouth daily.         No current facility-administered medications for this visit.    Allergies as of 11/23/2012  . (No Known Allergies)    Family History  Problem Relation Age of Onset  . Coronary artery disease    . Heart failure Mother   . Emphysema Father   . Heart disease Brother   . Heart disease Brother   . Colon cancer Brother     History   Social History  . Marital Status: Married    Spouse Name: N/A    Number of Children: 3  . Years of Education: N/A   Occupational History  . CEO-retired    Social History Main Topics  . Smoking status: Never Smoker   . Smokeless tobacco: Never Used  . Alcohol Use: Yes     Comment: occ.  . Drug Use: No  . Sexual Activity: Not on file   Other Topics Concern  . Not on file   Social History Narrative  . No narrative on file      Physical Exam: BP  100/60  Pulse 88  Ht 5' 9.5" (1.765 m)  Wt 169 lb (76.658 kg)  BMI 24.61 kg/m2 Constitutional: Elderly but  generally well-appearing, moves slowly Psychiatric: alert and oriented x3 Abdomen:  soft, nontender, nondistended, no obvious ascites, no peritoneal signs, normal bowel sounds     Assessment and plan: 79 y.o. male with Crohn's disease, recent small bowel obstruction  I think that his recent small bowel obstruction was probably from adhesive or stricturing disease, more likely adhesion. It came on quickly and resolved relatively quickly. Prednisone was given and he believes it made a large difference and he may be correct. He understands that bowel structures could continue to happen intermittently. His previous one was about 2 years ago. He is getting pretty frail, has heart condition and Parkinson's.  He will continue on his mesalamine and cholestyramine and he will return to see me in 12 months, sooner if needed.

## 2012-11-30 ENCOUNTER — Other Ambulatory Visit: Payer: Self-pay | Admitting: Gastroenterology

## 2013-01-25 ENCOUNTER — Ambulatory Visit (HOSPITAL_COMMUNITY)
Admission: RE | Admit: 2013-01-25 | Discharge: 2013-01-25 | Disposition: A | Discharge status: Home / Self Care | Payer: Medicare Other | Source: Ambulatory Visit | Attending: Internal Medicine | Admitting: Internal Medicine

## 2013-01-25 ENCOUNTER — Other Ambulatory Visit (HOSPITAL_COMMUNITY): Payer: Self-pay | Admitting: Internal Medicine

## 2013-01-25 VITALS — BP 114/62 | HR 98 | Wt 171.0 lb

## 2013-01-25 DIAGNOSIS — E785 Hyperlipidemia, unspecified: Secondary | ICD-10-CM | POA: Insufficient documentation

## 2013-01-25 DIAGNOSIS — I10 Essential (primary) hypertension: Secondary | ICD-10-CM

## 2013-01-25 DIAGNOSIS — I2510 Atherosclerotic heart disease of native coronary artery without angina pectoris: Secondary | ICD-10-CM | POA: Insufficient documentation

## 2013-01-25 NOTE — Addendum Note (Signed)
Encounter addended by: Scarlette Calico, RN on: 01/25/2013 11:52 AM<BR>     Documentation filed: Patient Instructions Section

## 2013-01-25 NOTE — Patient Instructions (Signed)
We will contact you in 1 year to schedule your next appointment.  

## 2013-01-25 NOTE — Progress Notes (Signed)
[name], male   DOB: [birth year], 79 y.o.   MRN: [id number]   PCP: Shon Baton, MD  History of Present Illness: [name] [name] is a 79 y.o. male with history of CAD, status post anterior ST elevation myocardial infarction treated with bare-metal stent to the LAD at [facility] in [place] in 04/2007.  His EF was 35-40%.  This improved to 60%.  Last echocardiogram performed 10/2006.  Last Myoview 02/2010: EF 71%, no scar or ischemia.  He also has a history of hypertension, hyperlipidemia, Crohn's disease, Parkinson's disease and GERD.    Cath by Dr. Johnsie Cancel on 12/2011: pLAD 30-40%, stent ok, ? If stent oversized, EF 60% and low LVEDP.  Medical therapy continued.    Follow up:  Since last visit was admitted in June 2014 for small bowel obstruction and he was treated with NGT, fluids, bowel rest and steroids. Did well. Now at [facility]. Doing fairly well. 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 Goes to water aerobics at [employer] 2x/week. Plays golf 1x/week. (rides the course) Main issue is that legs remain weak. Has seen Dr. Ellene Route who felt it was related to Parkinson's. + chronic exertional SOB. No CP.   Labs 08/17/12: BUN 19, Cr 1.2, K 4.9, TC 89, Trig 83, HDL 38, LDL 34           10/01/12: BUN 19., Cr 1.06, K+ 3.5   Past Medical History  Diagnosis Date  . Hypertension   . Hyperlipidemia   . Coronary artery disease     s/p ANTERIOR STEMI 1/09 at [facility]; treated with a bare-metal stent to the LAD; Myoview 11/11 EF 71%, no scar, no ischemia;     cath 12/20/10:   pLAD 30-40%, stent ok, ? If stent oversized, EF 60% and low LVEDP  . GERD (gastroesophageal reflux disease)   . Other esophagitis   . Diaphragmatic hernia without mention of obstruction or gangrene   . Ulcerative (chronic) ileocolitis   . Diverticulosis of colon (without mention of hemorrhage)   . Other chronic nonalcoholic liver disease   . Regional enteritis of small intestine   . Flatulence, eructation, and gas pain    . Ischemic cardiomyopathy     EF initially 35-40% after MI 1/09; echo 7/08: EF 60%  . Fatty liver     on ultrasound of 10/2009  . Parkinson's disease   . Gait disorder   . Memory disorder   . Osteoporosis   . Renal calculi   . Melanoma     Current Outpatient Prescriptions  Medication Sig Dispense Refill  . acetaminophen (TYLENOL) 325 MG tablet Take 650 mg by mouth every 6 (six) hours as needed for pain.      Marland Kitchen aspirin EC 81 MG tablet Take 81 mg by mouth daily.      Marland Kitchen atorvastatin (LIPITOR) 40 MG tablet Take 40 mg by mouth daily.        . calcium acetate (PHOSLO) 667 MG capsule Take 667 mg by mouth 3 (three) times daily with meals.      . carbidopa-levodopa (SINEMET IR) 25-100 MG per tablet One tablet in the morning and midday, and one half tablet at night  75 tablet  6  . carvedilol (COREG) 6.25 MG tablet Take 6.25 mg by mouth 2 (two) times daily with a meal.      . cholecalciferol (VITAMIN D) 1000 UNITS tablet Take 2,000 Units by mouth daily.      . cholestyramine (  QUESTRAN) 4 G packet Take 1 packet by mouth 2 (two) times daily with a meal.  60 each  11  . isosorbide mononitrate (IMDUR) 30 MG 24 hr tablet TAKE ONE TABLET BY MOUTH ONCE DAILY  30 tablet  6  . lansoprazole (PREVACID) 30 MG capsule Take 30 mg by mouth daily.      Marland Kitchen lisinopril (PRINIVIL,ZESTRIL) 10 MG tablet Take 10 mg by mouth daily.       . nitroGLYCERIN (NITROSTAT) 0.4 MG SL tablet Place 0.4 mg under the tongue every 5 (five) minutes as needed. For chest pain.      Marland Kitchen PENTASA 250 MG CR capsule TAKE TWO CAPSULES THREE TIMES DAILY  180 capsule  11  . Potassium Bicarbonate 99 MG CAPS Take 1 capsule by mouth daily.      Marland Kitchen pyridOXINE (VITAMIN B-6) 100 MG tablet Take 100 mg by mouth daily.        . Testosterone (ANDROGEL PUMP TD) Place 2 application onto the skin daily. 2 pumps once daily        No current facility-administered medications for this encounter.    Allergies: No Known Allergies  Social history:   Nonsmoker  Filed Vitals:   01/25/13 1051  BP: 114/62  Pulse: 98  Weight: 171 lb (77.565 kg)  SpO2: 95%    PHYSICAL EXAM: Well nourished, well developed, in no acute distress HEENT: normal Neck: no JVD Cardiac:  normal S1, S2; RRR; no murmur Lungs:  clear to auscultation bilaterally, no wheezing, rhonchi or rales Abd: soft, nontender, no hepatomegaly Ext: no edema; cyanosis or clubbing Neuro:  CNs 2-12 intact, no focal abnormalities noted. Mild cogwheeling Psych: Normal affect  ASSESSMENT AND PLAN:   1. CAD  - s/p LAD stent in 2009 in setting of anterior MI. Stent patent by cath 2013   --No evidence of ischemia. Continue current regimen.    --I told him he could stop carvedilol if neurologist felt it would help with parkinson's sx 2. Hyperlipidemia   --Followed closely by Dr. Virgina Jock. LDL at goal on statin.  3. HTN   -Blood pressure well controlled. Continue current regimen.  Daniel Bensimhon,MD 11:45 AM

## 2013-03-20 ENCOUNTER — Other Ambulatory Visit: Payer: Self-pay | Admitting: Gastroenterology

## 2013-04-12 ENCOUNTER — Ambulatory Visit: Payer: Medicare Other | Admitting: Physical Therapy

## 2013-04-21 ENCOUNTER — Other Ambulatory Visit: Payer: Self-pay | Admitting: Internal Medicine

## 2013-05-22 ENCOUNTER — Other Ambulatory Visit: Payer: Self-pay | Admitting: Internal Medicine

## 2013-07-01 ENCOUNTER — Ambulatory Visit (INDEPENDENT_AMBULATORY_CARE_PROVIDER_SITE_OTHER): Payer: Medicare Other | Admitting: Neurology

## 2013-07-01 ENCOUNTER — Encounter: Payer: Self-pay | Admitting: Neurology

## 2013-07-01 ENCOUNTER — Encounter (INDEPENDENT_AMBULATORY_CARE_PROVIDER_SITE_OTHER): Payer: Self-pay

## 2013-07-01 VITALS — BP 138/70 | HR 74 | Wt 177.0 lb

## 2013-07-01 DIAGNOSIS — G20 Parkinson's disease: Secondary | ICD-10-CM

## 2013-07-01 DIAGNOSIS — R269 Unspecified abnormalities of gait and mobility: Secondary | ICD-10-CM

## 2013-07-01 MED ORDER — CARBIDOPA-LEVODOPA 25-100 MG PO TABS: ORAL_TABLET | ORAL | Status: DC

## 2013-07-01 NOTE — Progress Notes (Signed)
Reason for visit: Parkinson's disease  [name] [name] is an 80 y.o. male  History of present illness:  [name] [name] is an 80 year old right-handed white male with a history of Parkinson's disease. The patient has done well since last seen, and he is remaining active. The patient continues to play golf, and he works out regularly. The patient has not had any falls. The patient is tolerating the medication well. The patient is complaining of a cough that has been present for several months. The patient is on lisinopril. The patient does not completely correlate the cough to eating. The patient denies any choking problems. The patient returns to this office for further evaluation. The patient is on Sinemet 25/100 mg tablets taking one full tablet in the morning and midday, one half tablet in the evening.  Past Medical History  Diagnosis Date  . Hypertension   . Hyperlipidemia   . Coronary artery disease     s/p ANTERIOR STEMI 1/09 at [facility]; treated with a bare-metal stent to the LAD; Myoview 11/11 EF 71%, no scar, no ischemia;     cath 12/20/10:   pLAD 30-40%, stent ok, ? If stent oversized, EF 60% and low LVEDP  . GERD (gastroesophageal reflux disease)   . Other esophagitis   . Diaphragmatic hernia without mention of obstruction or gangrene   . Ulcerative (chronic) ileocolitis   . Diverticulosis of colon (without mention of hemorrhage)   . Other chronic nonalcoholic liver disease   . Regional enteritis of small intestine   . Flatulence, eructation, and gas pain   . Ischemic cardiomyopathy     EF initially 35-40% after MI 1/09; echo 7/08: EF 60%  . Fatty liver     on ultrasound of 10/2009  . Parkinson's disease   . Gait disorder   . Memory disorder   . Osteoporosis   . Renal calculi   . Melanoma     Past Surgical History  Procedure Laterality Date  . Cardiac catheterization  09/25/06    EF 35-40% but more recently 60%  . Cholecystectomy    . Appendectomy    . Back surgery      L3, L4, L5  . Tonsillectomy    . Partial bowel resection    . Coronary artery stent placement    . Melanoma resection    . Cataract extraction      Bilateral    Family History  Problem Relation Age of Onset  . Coronary artery disease    . Heart failure Mother   . Emphysema Father   . Heart disease Brother   . Heart disease Brother   . Colon cancer Brother     Social history:  reports that he has never smoked. He has never used smokeless tobacco. He reports that he drinks alcohol. He reports that he does not use illicit drugs.   No Known Allergies  Medications:  Current Outpatient Prescriptions on File Prior to Visit  Medication Sig Dispense Refill  . acetaminophen (TYLENOL) 325 MG tablet Take 650 mg by mouth every 6 (six) hours as needed for pain.      Marland Kitchen aspirin EC 81 MG tablet Take 81 mg by mouth daily.      Marland Kitchen atorvastatin (LIPITOR) 40 MG tablet Take 40 mg by mouth daily.        . carvedilol (COREG) 6.25 MG tablet Take 6.25 mg by mouth 2 (two) times daily with a meal.      . cholecalciferol (  VITAMIN D) 1000 UNITS tablet Take 2,000 Units by mouth daily.      . cholestyramine (QUESTRAN) 4 G packet TAKE 1 PACKET BY MOUTH TWICE DAILY WITH A MEAL  60 each  3  . isosorbide mononitrate (IMDUR) 30 MG 24 hr tablet TAKE ONE TABLET BY MOUTH ONCE DAILY  30 tablet  6  . lansoprazole (PREVACID) 30 MG capsule Take 30 mg by mouth daily.      Marland Kitchen lisinopril (PRINIVIL,ZESTRIL) 10 MG tablet Take 10 mg by mouth daily.       Marland Kitchen NITROSTAT 0.4 MG SL tablet AS DIRECTED  25 tablet  6  . PENTASA 250 MG CR capsule TAKE TWO CAPSULES THREE TIMES DAILY  180 capsule  11  . Potassium Bicarbonate 99 MG CAPS Take 1 capsule by mouth daily.      Marland Kitchen pyridOXINE (VITAMIN B-6) 100 MG tablet Take 100 mg by mouth daily.        . Testosterone (ANDROGEL PUMP TD) Place 2 application onto the skin daily. 2 pumps once daily        No current facility-administered medications on file prior to visit.    ROS:  Out of a  complete 14 system review of symptoms, the patient complains only of the following symptoms, and all other reviewed systems are negative.  Fatigue Runny nose, drooling Cough, shortness of breath Leg swelling Heat intolerance Diarrhea Acting out dreams Walking difficulties Dizziness  Blood pressure 138/70, pulse 74, weight 177 lb (80.287 kg).  Physical Exam  General: The patient is alert and cooperative at the time of the examination.  Skin: No significant peripheral edema is noted.   Neurologic Exam  Mental status: The patient is oriented x 3. Mini-Mental status examination done today shows a total score of 30/30.  Cranial nerves: Facial symmetry is present. Speech is normal, no aphasia or dysarthria is noted. Extraocular movements are full. Visual fields are full. Masking of the face is seen.  Motor: The patient has good strength in all 4 extremities.  Sensory examination: Soft touch sensation is symmetric on the face, arms, and legs.  Coordination: The patient has good finger-nose-finger and heel-to-shin bilaterally.  Gait and station: The patient has a normal gait. The patient is able to arise from a seated position. The patient has some decreased arm swing bilaterally. Tandem gait is normal. Romberg is negative. No drift is seen.  Reflexes: Deep tendon reflexes are symmetric.   Assessment/Plan:  1. Parkinson's disease  2. Mild memory disturbance  The patient is doing fairly well at this time. The patient will continue on his current medication regimen for the Parkinson's. The patient is remaining very active, which has been helpful to maintain his functional abilities. The patient will followup in about 5 months.  Jill Alexanders MD 07/01/2013 7:47 PM  [facility] [address] [place], [place] [address]  Phone [phone] Fax [phone]

## 2013-07-01 NOTE — Patient Instructions (Signed)
Parkinson Disease Parkinson disease is a disorder of the central nervous system, which includes the brain and spinal cord. A person with this disease slowly loses the ability to completely control body movements. Within the brain, there is a group of nerve cells (basal ganglia) that help control movement. The basal ganglia are damaged and do not work properly in a person with Parkinson disease. In addition, the basal ganglia produce and use a brain chemical called dopamine. The dopamine chemical sends messages to other parts of the body to control and coordinate body movements. Dopamine levels are low in a person with Parkinson disease. If the dopamine levels are low, then the body does not receive the correct messages it needs to move normally.  CAUSES  The exact reason why the basal ganglia get damaged is not known. Some medical researchers have thought that infection, genes, environment, and certain medicines may contribute to the cause.  SYMPTOMS   An early symptom of Parkinson disease is often an uncontrolled shaking (tremor) of the hands. The tremor will often disappear when the affected hand is consciously used.  As the disease progresses, walking, talking, getting out of a chair, and new movements become more difficult.  Muscles get stiff and movements become slower.  Balance and coordination become harder.  Depression, trouble swallowing, urinary problems, constipation, and sleep problems can occur.  Later in the disease, memory and thought processes may deteriorate. DIAGNOSIS  There are no specific tests to diagnose Parkinson disease. You may be referred to a neurologist for evaluation. Your caregiver will ask about your medical history, symptoms, and perform a physical exam. Blood tests and imaging tests of your brain may be performed to rule out other diseases. The imaging tests may include an MRI or a CT scan. TREATMENT  The goal of treatment is to relieve symptoms. Medicines may be  prescribed once the symptoms become troublesome. Medicine will not stop the progression of the disease, but medicine can make movement and balance better and help control tremors. Speech and occupational therapy may also be prescribed. Sometimes, surgical treatment of the brain can be done in young people. HOME CARE INSTRUCTIONS  Get regular exercise and rest periods during the day to help prevent exhaustion and depression.  If getting dressed becomes difficult, replace buttons and zippers with Velcro and elastic on your clothing.  Take all medicine as directed by your caregiver.  Install grab bars or railings in your home to prevent falls.  Go to speech or occupational therapy as directed.  Keep all follow-up visits as directed by your caregiver. SEEK MEDICAL CARE IF:  Your symptoms are not controlled with your medicine.  You fall.  You have trouble swallowing or choke on your food. MAKE SURE YOU:  Understand these instructions.  Will watch your condition.  Will get help right away if you are not doing well or get worse. Document Released: 03/22/2000 Document Revised: 07/20/2012 Document Reviewed: 04/24/2011 [facility] Patient Information 2014 [place], [place].

## 2013-07-08 ENCOUNTER — Other Ambulatory Visit: Payer: Self-pay

## 2013-07-27 ENCOUNTER — Other Ambulatory Visit: Payer: Self-pay | Admitting: Dermatology

## 2013-08-24 ENCOUNTER — Encounter: Payer: Self-pay | Admitting: Cardiology

## 2013-09-02 ENCOUNTER — Ambulatory Visit (INDEPENDENT_AMBULATORY_CARE_PROVIDER_SITE_OTHER): Payer: Medicare Other | Admitting: Cardiology

## 2013-09-02 ENCOUNTER — Encounter: Payer: Self-pay | Admitting: Cardiology

## 2013-09-02 VITALS — BP 124/80 | HR 90 | Ht 69.5 in | Wt 177.4 lb

## 2013-09-02 DIAGNOSIS — E785 Hyperlipidemia, unspecified: Secondary | ICD-10-CM

## 2013-09-02 DIAGNOSIS — I4510 Unspecified right bundle-branch block: Secondary | ICD-10-CM | POA: Insufficient documentation

## 2013-09-02 DIAGNOSIS — I10 Essential (primary) hypertension: Secondary | ICD-10-CM

## 2013-09-02 DIAGNOSIS — I2510 Atherosclerotic heart disease of native coronary artery without angina pectoris: Secondary | ICD-10-CM

## 2013-09-02 HISTORY — DX: Unspecified right bundle-branch block: I45.10

## 2013-09-02 NOTE — Patient Instructions (Signed)
Continue your current therapy  I will see you in one year   

## 2013-09-04 NOTE — Progress Notes (Signed)
[name] [name] Date of Birth: [birth year] Medical Record #[id number]  History of Present Illness: [name] is seen today to establish cardiac care. He is a former patient of Dr. Haroldine Laws. He is a pleasant 80 yo WM with history of CAD. He is s/p anterior STEMI in Jan 2009 at [facility] in [place]. He had a BMS to the LAD. EF was initially 35-40% but subsequently returned to normal. Last myoview in Nov 2011 was normal with EF 71%. Last cath in Sept. 2013 showed a patent stent. 30-40% proximal LAD. EF 60% and normal EDP.  On follow up today he reports he is doing well from a cardiac standpoint. No chest pain, shoulder pain, or dyspnea. He still works out at [employer] 4 days a week and plays golf. He is more limited by his Parkinsonism. He also has a history of Chron's disease and was hospitalized last summer. He improved on steroid therapy. History of HTN, HL, and GERD.  Outpatient Prescriptions Prior to Visit  Medication Sig Dispense Refill  . acetaminophen (TYLENOL) 325 MG tablet Take 650 mg by mouth every 6 (six) hours as needed for pain.      Marland Kitchen aspirin EC 81 MG tablet Take 81 mg by mouth daily.      Marland Kitchen atorvastatin (LIPITOR) 40 MG tablet Take 40 mg by mouth daily.        . calcium carbonate (CALCIUM 600) 600 MG TABS tablet Take 600 mg by mouth daily with breakfast.      . carbidopa-levodopa (SINEMET IR) 25-100 MG per tablet One tablet in the morning and midday, and one half tablet at night  75 tablet  6  . carvedilol (COREG) 6.25 MG tablet Take 6.25 mg by mouth 2 (two) times daily with a meal.      . cholecalciferol (VITAMIN D) 1000 UNITS tablet Take 2,000 Units by mouth daily.      . cholestyramine (QUESTRAN) 4 G packet TAKE 1 PACKET BY MOUTH TWICE DAILY WITH A MEAL  60 each  3  . isosorbide mononitrate (IMDUR) 30 MG 24 hr tablet TAKE ONE TABLET BY MOUTH ONCE DAILY  30 tablet  6  . lansoprazole (PREVACID) 30 MG capsule Take 30 mg by mouth daily.      Marland Kitchen PENTASA 250 MG CR capsule TAKE TWO  CAPSULES THREE TIMES DAILY  180 capsule  11  . Potassium Bicarbonate 99 MG CAPS Take 1 capsule by mouth daily.      Marland Kitchen pyridOXINE (VITAMIN B-6) 100 MG tablet Take 100 mg by mouth daily.        . Testosterone (ANDROGEL PUMP TD) Place 2 application onto the skin daily. 2 pumps once daily       . lisinopril (PRINIVIL,ZESTRIL) 10 MG tablet Take 10 mg by mouth daily.       Marland Kitchen NITROSTAT 0.4 MG SL tablet AS DIRECTED  25 tablet  6   No facility-administered medications prior to visit.    No Known Allergies  Past Medical History  Diagnosis Date  . Hypertension   . Hyperlipidemia   . Coronary artery disease     s/p ANTERIOR STEMI 1/09 at [facility]; treated with a bare-metal stent to the LAD; Myoview 11/11 EF 71%, no scar, no ischemia;     cath 12/20/10:   pLAD 30-40%, stent ok, ? If stent oversized, EF 60% and low LVEDP  . GERD (gastroesophageal reflux disease)   . Other esophagitis   . Diaphragmatic hernia without mention of  obstruction or gangrene   . Ulcerative (chronic) ileocolitis   . Diverticulosis of colon (without mention of hemorrhage)   . Other chronic nonalcoholic liver disease   . Regional enteritis of small intestine   . Flatulence, eructation, and gas pain   . Ischemic cardiomyopathy     EF initially 35-40% after MI 1/09; echo 7/08: EF 60%  . Fatty liver     on ultrasound of 10/2009  . Parkinson's disease   . Gait disorder   . Memory disorder   . Osteoporosis   . Renal calculi   . Melanoma   . RBBB 09/02/2013    Past Surgical History  Procedure Laterality Date  . Cardiac catheterization  09/25/06    EF 35-40% but more recently 60%  . Cholecystectomy    . Appendectomy    . Back surgery      L3, L4, L5  . Tonsillectomy    . Partial bowel resection    . Coronary artery stent placement    . Melanoma resection    . Cataract extraction      Bilateral    History   Social History  . Marital Status: Married    Spouse Name: N/A    Number of Children: 3  . Years of  Education: college   Occupational History  . CEO-retired   . CEO    Social History Main Topics  . Smoking status: Never Smoker   . Smokeless tobacco: Never Used  . Alcohol Use: Yes     Comment: occ.  . Drug Use: No  . Sexual Activity: None   Other Topics Concern  . None   Social History Narrative  . None    Family History  Problem Relation Age of Onset  . Coronary artery disease    . Heart failure Mother   . Emphysema Father   . Heart disease Brother   . Heart disease Brother   . Colon cancer Brother     Review of Systems: As noted in HPI.  All other systems were reviewed and are negative.  Physical Exam: BP 124/80  Pulse 90  Ht 5' 9.5" (1.765 m)  Wt 177 lb 6.4 oz (80.468 kg)  BMI 25.83 kg/m2  SpO2 93% Filed Weights   09/02/13 1552  Weight: 177 lb 6.4 oz (80.468 kg)  GENERAL:  Well appearing WM in NAD HEENT:  PERRL, EOMI, sclera are clear. Oropharynx is clear. NECK:  No jugular venous distention, carotid upstroke brisk and symmetric, no bruits, no thyromegaly or adenopathy LUNGS:  Clear to auscultation bilaterally CHEST:  Unremarkable HEART:  RRR,  PMI not displaced or sustained,S1 and S2 within normal limits, no S3, no S4: no clicks, no rubs, no murmurs ABD:  Soft, nontender. BS +, no masses or bruits. No hepatomegaly, no splenomegaly EXT:  2 + pulses throughout, no edema, no cyanosis no clubbing SKIN:  Warm and dry.  No rashes NEURO:  Alert and oriented x 3. Cranial nerves II through XII intact. Mild tremor. PSYCH:  Cognitively intact    LABORATORY DATA: Ecg: NSR, 1st degree AV block. RBBB  Labs from Dr. Virgina Jock reviewed from 08/24/13. Normal chemistry panel and CBC. Cholesterol 93, triglycerides 107, HDL 41, LDL 31. TSH, vit D, and PSA normal.  Assessment / Plan: 1. CAD with prior Anterior STEMI Jan 2009 treated with BMS of LAD. Follow up cath 9/13 showed patent stent and no obstructive disease. Normal LV function. Continue ASA, statin, and coreg.  Follow up in one  year.   2. HTN controlled.  3. Hyperlipidemia. Excellent control on statin.

## 2013-10-20 ENCOUNTER — Other Ambulatory Visit: Payer: Self-pay

## 2013-10-26 ENCOUNTER — Other Ambulatory Visit: Payer: Self-pay | Admitting: Gastroenterology

## 2013-11-15 ENCOUNTER — Encounter: Payer: Self-pay | Admitting: Gastroenterology

## 2013-11-15 ENCOUNTER — Ambulatory Visit (INDEPENDENT_AMBULATORY_CARE_PROVIDER_SITE_OTHER): Payer: Medicare Other | Admitting: Gastroenterology

## 2013-11-15 VITALS — BP 112/76 | HR 104 | Ht 68.0 in | Wt 177.1 lb

## 2013-11-15 DIAGNOSIS — K5090 Crohn's disease, unspecified, without complications: Secondary | ICD-10-CM

## 2013-11-15 MED ORDER — MESALAMINE ER 250 MG PO CPCR: ORAL_CAPSULE | ORAL | Status: DC

## 2013-11-15 MED ORDER — CHOLESTYRAMINE 4 G PO PACK: PACK | ORAL | Status: DC

## 2013-11-15 NOTE — Patient Instructions (Signed)
Refills on pentasa and cholestyramine called in. Please return to see Dr. Ardis Hughs in 12 months.

## 2013-11-15 NOTE — Progress Notes (Signed)
GI PROBLEM LIST:  1. Crohn's disease. Distant ileal and right colon resection by Dr. Gretta Cool in the very distant past. He was maintained on Pentasa, Entocort, and 87m of Purinethol for several years under the care of Dr. SLyla Son Hospitalization, May 2008, for acute myocardial infarction complicated by small bowel obstruction likely due to active Crohn's. Increased Purinethol to 100 mg daily and liver tests increased as well. Purinethol held and liver test normalized. The patient felt much better overall as well (less diarrhea, less fatigue). Winter, 2009: currently on 6 pills of Pentasa day feeling quite well overall. Summer, 2010 postoperative ileus following spine surgery (doubt active Crohn's flare). November, 2011: started cholestyramine with very good results (loose stools MUCH improved, only going 3-4 times a day)  2. Dysphagia February 2012: Barium esophagram showed mild narrowing at his GE junction. Was planning to perform dilation off Plavix (if okay with his cardiologist) when he had a bowel obstruction.  3. partial small bowel obstruction, February 2012. CT suggested transition point in the pelvis. Not clear if there was active Crohn's disease contributing to this, however he was put on prednisone in hosp; started 380ma day, taper off quickly. Recurrent obstruction 09/2012: CT scan IMPRESSION: Small bowel obstruction with apparent change in caliber within the anterior right lower quadrant - right upper pelvis most likely due to an adhesion. Was admitted to hosp, put on steroids empirically however seems more likely to have been adhesive related than due to active IBD.    HPI: This is a  very pleasant [age]ear old man whom I last saw about a year ago. He has been diligent doing very well in the interim. He has required no hospitalizations for GI issues.    Labs 08/2013: cbc, cmet essentially normal.  Memory issues.  He thought he was hospitalized this past summer but actually it was  2014.  Bowels under his usual control: he will have BM after every meal and 2 other times in the day,  He is happy with these.     Past Medical History  Diagnosis Date  . Hypertension   . Hyperlipidemia   . Coronary artery disease     [facility]; Myoview 11/11 EF 71%, no scar, no ischemia;     cath 12/20/10:   pLAD 30-40%, stent ok, ? If stent oversized, EF 60% and low LVEDP  . GERD (gastroesophageal reflux disease)   . Other esophagitis   . Diaphragmatic hernia without mention of obstruction or gangrene   . Ulcerative (chronic) ileocolitis   . Diverticulosis of colon (without mention of hemorrhage)   . Other chronic nonalcoholic liver disease   . Regional enteritis of small intestine   . Flatulence, eructation, and gas pain   . Ischemic cardiomyopathy     EF initially 35-40% after MI 1/09; echo 7/08: EF 60%  . Fatty liver     on ultrasound of 10/2009  . Parkinson's disease   . Gait disorder   . Memory disorder   . Osteoporosis   . Renal calculi   . Melanoma   . RBBB 09/02/2013    Past Surgical History  Procedure Laterality Date  . Cardiac catheterization  09/25/06    EF 35-40% but more recently 60%  . Cholecystectomy    . Appendectomy    . Back surgery      L3, L4, L5  . Tonsillectomy    . Partial bowel resection    .  Coronary artery stent placement    . Melanoma resection    . Cataract extraction      Bilateral    Current Outpatient Prescriptions  Medication Sig Dispense Refill  . acetaminophen (TYLENOL) 325 MG tablet Take 650 mg by mouth every 6 (six) hours as needed for pain.      Marland Kitchen aspirin EC 81 MG tablet Take 81 mg by mouth daily.      Marland Kitchen atorvastatin (LIPITOR) 40 MG tablet Take 40 mg by mouth daily.        . calcium carbonate (CALCIUM 600) 600 MG TABS tablet Take 600 mg by mouth daily with breakfast.      . carbidopa-levodopa (SINEMET IR) 25-100 MG per tablet One tablet in the morning and  midday, and one half tablet at night  75 tablet  6  . carvedilol (COREG) 6.25 MG tablet Take 6.25 mg by mouth 2 (two) times daily with a meal.      . cholecalciferol (VITAMIN D) 1000 UNITS tablet Take 2,000 Units by mouth daily.      . cholestyramine (QUESTRAN) 4 G packet TAKE 1 PACKET BY MOUTH TWICE DAILY WITH A MEAL  60 each  0  . isosorbide mononitrate (IMDUR) 30 MG 24 hr tablet TAKE ONE TABLET BY MOUTH ONCE DAILY  30 tablet  6  . lansoprazole (PREVACID) 30 MG capsule Take 30 mg by mouth daily.      . Multiple Vitamins-Minerals (CENTRUM SILVER PO) Take 1 tablet by mouth daily.      Marland Kitchen PENTASA 250 MG CR capsule TAKE TWO CAPSULES THREE TIMES DAILY  180 capsule  11  . Potassium Bicarbonate 99 MG CAPS Take 1 capsule by mouth daily.      . Testosterone (ANDROGEL PUMP TD) Place 2 application onto the skin daily. 2 pumps once daily       . vitamin B-12 (CYANOCOBALAMIN) 1000 MCG tablet Take 1,000 mcg by mouth daily.       No current facility-administered medications for this visit.    Allergies as of 11/15/2013  . (No Known Allergies)    Family History  Problem Relation Age of Onset  . Coronary artery disease    . Heart failure Mother   . Emphysema Father   . Heart disease Brother   . Heart disease Brother   . Colon cancer Brother     History   Social History  . Marital Status: Married    Spouse Name: N/A    Number of Children: 3  . Years of Education: college   Occupational History  . CEO-retired   . CEO    Social History Main Topics  . Smoking status: Never Smoker   . Smokeless tobacco: Never Used  . Alcohol Use: Yes     Comment: occ.  . Drug Use: No  . Sexual Activity: Not on file   Other Topics Concern  . Not on file   Social History Narrative  . No narrative on file      Physical Exam: BP 112/76  Pulse 104  Ht 5' 8"  (1.727 m)  Wt 177 lb 2 oz (80.343 kg)  BMI 26.94 kg/m2 Constitutional: generally well-appearing, elderly Psychiatric: alert and oriented  x3 Abdomen: soft, nontender, nondistended, no obvious ascites, no peritoneal signs, normal bowel sounds     Assessment and plan: 80 y.o. male with Crohn's ileocolitis under good control  He will continue his mesalamine orally. He will continue cholestyramine once to twice daily. He will return to  see me in 12 months and sooner if needed.

## 2013-12-07 ENCOUNTER — Encounter: Payer: Self-pay | Admitting: Neurology

## 2013-12-07 ENCOUNTER — Ambulatory Visit (INDEPENDENT_AMBULATORY_CARE_PROVIDER_SITE_OTHER): Payer: Medicare Other | Admitting: Neurology

## 2013-12-07 VITALS — BP 135/75 | HR 88 | Wt 177.0 lb

## 2013-12-07 DIAGNOSIS — G20 Parkinson's disease: Secondary | ICD-10-CM

## 2013-12-07 DIAGNOSIS — R413 Other amnesia: Secondary | ICD-10-CM

## 2013-12-07 MED ORDER — CARBIDOPA-LEVODOPA 25-100 MG PO TABS: 1.0000 | ORAL_TABLET | Freq: Three times a day (TID) | ORAL | Status: DC

## 2013-12-07 NOTE — Progress Notes (Signed)
Reason for visit: Parkinson's disease  [name] [name] is an 80 y.o. male  History of present illness:  [name] [name] is an 80 year old right-handed white male with a history of Parkinson's disease. The patient remains quite active, playing golf and working out on a regular basis. The patient has not noted any significant change in his physical abilities or his cognitive functioning since last seen. The patient is on relatively low-dose Sinemet taking the 25/100 mg tablet, one in the morning, one at midday, and one half in the evening. The patient denies any falls. He continues to play golf on a regular basis. He denies any difficulty with fatigue, and he sleeps well at night. The patient denies problems with swallowing. He returns to this office for an evaluation.  Past Medical History  Diagnosis Date  . Hypertension   . Hyperlipidemia   . Coronary artery disease     s/p ANTERIOR STEMI 1/09 at [facility]; treated with a bare-metal stent to the LAD; Myoview 11/11 EF 71%, no scar, no ischemia;     cath 12/20/10:   pLAD 30-40%, stent ok, ? If stent oversized, EF 60% and low LVEDP  . GERD (gastroesophageal reflux disease)   . Other esophagitis   . Diaphragmatic hernia without mention of obstruction or gangrene   . Ulcerative (chronic) ileocolitis   . Diverticulosis of colon (without mention of hemorrhage)   . Other chronic nonalcoholic liver disease   . Regional enteritis of small intestine   . Flatulence, eructation, and gas pain   . Ischemic cardiomyopathy     EF initially 35-40% after MI 1/09; echo 7/08: EF 60%  . Fatty liver     on ultrasound of 10/2009  . Parkinson's disease   . Gait disorder   . Memory disorder   . Osteoporosis   . Renal calculi   . Melanoma   . RBBB 09/02/2013    Past Surgical History  Procedure Laterality Date  . Cardiac catheterization  09/25/06    EF 35-40% but more recently 60%  . Cholecystectomy    . Appendectomy    . Back surgery      L3, L4, L5  .  Tonsillectomy    . Partial bowel resection    . Coronary artery stent placement    . Melanoma resection    . Cataract extraction      Bilateral    Family History  Problem Relation Age of Onset  . Coronary artery disease    . Heart failure Mother   . Emphysema Father   . Heart disease Brother   . Heart disease Brother   . Colon cancer Brother     Social history:  reports that he has never smoked. He has never used smokeless tobacco. He reports that he drinks alcohol. He reports that he does not use illicit drugs.   No Known Allergies  Medications:  Current Outpatient Prescriptions on File Prior to Visit  Medication Sig Dispense Refill  . acetaminophen (TYLENOL) 325 MG tablet Take 650 mg by mouth every 6 (six) hours as needed for pain.      Marland Kitchen aspirin EC 81 MG tablet Take 81 mg by mouth daily.      Marland Kitchen atorvastatin (LIPITOR) 40 MG tablet Take 40 mg by mouth daily.        . calcium carbonate (CALCIUM 600) 600 MG TABS tablet Take 600 mg by mouth daily with breakfast.      . carvedilol (COREG) 6.25 MG  tablet Take 6.25 mg by mouth 2 (two) times daily with a meal.      . cholecalciferol (VITAMIN D) 1000 UNITS tablet Take 2,000 Units by mouth daily.      . cholestyramine (QUESTRAN) 4 G packet TAKE 1 PACKET BY MOUTH TWICE DAILY WITH A MEAL  60 each  11  . isosorbide mononitrate (IMDUR) 30 MG 24 hr tablet TAKE ONE TABLET BY MOUTH ONCE DAILY  30 tablet  6  . lansoprazole (PREVACID) 30 MG capsule Take 30 mg by mouth daily.      . mesalamine (PENTASA) 250 MG CR capsule TAKE TWO CAPSULES THREE TIMES DAILY  180 capsule  11  . Multiple Vitamins-Minerals (CENTRUM SILVER PO) Take 1 tablet by mouth daily.      . Potassium Bicarbonate 99 MG CAPS Take 1 capsule by mouth daily.      . Testosterone (ANDROGEL PUMP TD) Place 2 application onto the skin daily. 2 pumps once daily       . vitamin B-12 (CYANOCOBALAMIN) 1000 MCG tablet Take 1,000 mcg by mouth daily.       No current facility-administered  medications on file prior to visit.    ROS:  Out of a complete 14 system review of symptoms, the patient complains only of the following symptoms, and all other reviewed systems are negative.  Runny nose Cough, shortness of breath Diarrhea/Crohn's disease Frequency of urination Walking difficulty  Blood pressure 135/75, pulse 88, weight 177 lb (80.287 kg).  Physical Exam  General: The patient is alert and cooperative at the time of the examination.  Skin: No significant peripheral edema is noted.   Neurologic Exam  Mental status: The Mini-Mental status examination done today shows a total 24/30.  Cranial nerves: Facial symmetry is present. Speech is normal, no aphasia or dysarthria is noted. Extraocular movements are full. Visual fields are full.  Motor: The patient has good strength in all 4 extremities.  Sensory examination: Soft touch sensation is symmetric on the face, arms, and legs.  Coordination: The patient has good finger-nose-finger and heel-to-shin bilaterally.  Gait and station: The patient is able to arise from a seated position with arms crossed. Once up, the patient has a slightly stooped posture, slight decreased arm swing, slowness of movement. The patient has fair turns. Tandem gait is slightly unsteady. Romberg is negative. No drift is seen.  Reflexes: Deep tendon reflexes are symmetric.   Assessment/Plan:  One. Parkinson's disease  2. Memory disturbance  The memory issues will need to be followed over time. The patient has dropped points on his Mini-Mental status examination since last seen. If this continues, medication will need to be added for memory. The patient otherwise is remaining active. His ability to ambulate has slowed down slightly to the Sinemet will be increased to 25/100 mg tablets 3 times daily. He will followup in 4 or 5 months.  Jill Alexanders MD 12/07/2013 7:51 PM  [facility] [address] [place], [place] [address]  Phone [phone] Fax [phone]

## 2013-12-07 NOTE — Patient Instructions (Signed)
Parkinson Disease Parkinson disease is a disorder of the central nervous system, which includes the brain and spinal cord. A person with this disease slowly loses the ability to completely control body movements. Within the brain, there is a group of nerve cells (basal ganglia) that help control movement. The basal ganglia are damaged and do not work properly in a person with Parkinson disease. In addition, the basal ganglia produce and use a brain chemical called dopamine. The dopamine chemical sends messages to other parts of the body to control and coordinate body movements. Dopamine levels are low in a person with Parkinson disease. If the dopamine levels are low, then the body does not receive the correct messages it needs to move normally.  CAUSES  The exact reason why the basal ganglia get damaged is not known. Some medical researchers have thought that infection, genes, environment, and certain medicines may contribute to the cause.  SYMPTOMS   An early symptom of Parkinson disease is often an uncontrolled shaking (tremor) of the hands. The tremor will often disappear when the affected hand is consciously used.  As the disease progresses, walking, talking, getting out of a chair, and new movements become more difficult.  Muscles get stiff and movements become slower.  Balance and coordination become harder.  Depression, trouble swallowing, urinary problems, constipation, and sleep problems can occur.  Later in the disease, memory and thought processes may deteriorate. DIAGNOSIS  There are no specific tests to diagnose Parkinson disease. You may be referred to a neurologist for evaluation. Your caregiver will ask about your medical history, symptoms, and perform a physical exam. Blood tests and imaging tests of your brain may be performed to rule out other diseases. The imaging tests may include an MRI or a CT scan. TREATMENT  The goal of treatment is to relieve symptoms. Medicines may be  prescribed once the symptoms become troublesome. Medicine will not stop the progression of the disease, but medicine can make movement and balance better and help control tremors. Speech and occupational therapy may also be prescribed. Sometimes, surgical treatment of the brain can be done in young people. HOME CARE INSTRUCTIONS  Get regular exercise and rest periods during the day to help prevent exhaustion and depression.  If getting dressed becomes difficult, replace buttons and zippers with Velcro and elastic on your clothing.  Take all medicine as directed by your caregiver.  Install grab bars or railings in your home to prevent falls.  Go to speech or occupational therapy as directed.  Keep all follow-up visits as directed by your caregiver. SEEK MEDICAL CARE IF:  Your symptoms are not controlled with your medicine.  You fall.  You have trouble swallowing or choke on your food. MAKE SURE YOU:  Understand these instructions.  Will watch your condition.  Will get help right away if you are not doing well or get worse. Document Released: 03/22/2000 Document Revised: 07/20/2012 Document Reviewed: 04/24/2011 [facility] Patient Information 2015 [place], [place]. This information is not intended to replace advice given to you by your health care provider. Make sure you discuss any questions you have with your health care provider.

## 2013-12-09 ENCOUNTER — Encounter (HOSPITAL_COMMUNITY): Payer: Self-pay | Admitting: Vascular Surgery

## 2013-12-20 ENCOUNTER — Other Ambulatory Visit: Payer: Self-pay | Admitting: Internal Medicine

## 2013-12-20 DIAGNOSIS — I509 Heart failure, unspecified: Secondary | ICD-10-CM

## 2014-02-23 ENCOUNTER — Encounter: Payer: Self-pay | Admitting: Neurology

## 2014-03-01 ENCOUNTER — Encounter: Payer: Self-pay | Admitting: Neurology

## 2014-04-04 ENCOUNTER — Ambulatory Visit (INDEPENDENT_AMBULATORY_CARE_PROVIDER_SITE_OTHER): Payer: Medicare Other | Admitting: Neurology

## 2014-04-04 ENCOUNTER — Encounter: Payer: Self-pay | Admitting: Neurology

## 2014-04-04 ENCOUNTER — Telehealth: Payer: Self-pay | Admitting: Gastroenterology

## 2014-04-04 VITALS — BP 110/70 | HR 101

## 2014-04-04 DIAGNOSIS — R269 Unspecified abnormalities of gait and mobility: Secondary | ICD-10-CM

## 2014-04-04 DIAGNOSIS — G20 Parkinson's disease: Secondary | ICD-10-CM

## 2014-04-04 DIAGNOSIS — R413 Other amnesia: Secondary | ICD-10-CM

## 2014-04-04 MED ORDER — CARBIDOPA-LEVODOPA 25-100 MG PO TABS: ORAL_TABLET | ORAL | Status: DC

## 2014-04-04 NOTE — Progress Notes (Signed)
Reason for visit: Parkinson's disease  [name] [name] is an 80 y.o. male  History of present illness:  [name] [name] is an 80 year old right-handed white male with Parkinson's disease associated with a mild gait disorder. The patient has not had any falls since last seen. He is having increasing problems getting up from a chair, he has difficulty standing in one place, with a tendency to fall over. The patient has had increasing problems buttoning buttons. He has had tremors in the hands, left greater than right. He is sleeping well at night. He tries to remain active, playing golf. He does have some shortness of breath with walking. This has been a problem for him for quite some time. He continues to have some memory problems, but this has not significantly progressed. He returns to the office today for an evaluation.  Past Medical History  Diagnosis Date  . Hypertension   . Hyperlipidemia   . Coronary artery disease     s/p ANTERIOR STEMI 1/09 at [place]; treated with a bare-metal stent to the LAD; Myoview 11/11 EF 71%, no scar, no ischemia;     cath 12/20/10:   pLAD 30-40%, stent ok, ? If stent oversized, EF 60% and low LVEDP  . GERD (gastroesophageal reflux disease)   . Other esophagitis   . Diaphragmatic hernia without mention of obstruction or gangrene   . Ulcerative (chronic) ileocolitis   . Diverticulosis of colon (without mention of hemorrhage)   . Other chronic nonalcoholic liver disease   . Regional enteritis of small intestine   . Flatulence, eructation, and gas pain   . Ischemic cardiomyopathy     EF initially 35-40% after MI 1/09; echo 7/08: EF 60%  . Fatty liver     on ultrasound of 10/2009  . Parkinson's disease   . Gait disorder   . Memory disorder   . Osteoporosis   . Renal calculi   . Melanoma   . RBBB 09/02/2013    Past Surgical History  Procedure Laterality Date  . Cardiac catheterization  09/25/06    EF 35-40% but more recently 60%  . Cholecystectomy    .  Appendectomy    . Back surgery      L3, L4, L5  . Tonsillectomy    . Partial bowel resection    . Coronary artery stent placement    . Melanoma resection    . Cataract extraction      Bilateral    Family History  Problem Relation Age of Onset  . Coronary artery disease    . Heart failure Mother   . Emphysema Father   . Heart disease Brother   . Heart disease Brother   . Colon cancer Brother     Social history:  reports that he has never smoked. He has never used smokeless tobacco. He reports that he drinks alcohol. He reports that he does not use illicit drugs.   No Known Allergies  Medications:  Current Outpatient Prescriptions on File Prior to Visit  Medication Sig Dispense Refill  . acetaminophen (TYLENOL) 325 MG tablet Take 650 mg by mouth every 6 (six) hours as needed for pain.    Marland Kitchen aspirin EC 81 MG tablet Take 81 mg by mouth daily.    Marland Kitchen atorvastatin (LIPITOR) 40 MG tablet Take 40 mg by mouth daily.      . calcium carbonate (CALCIUM 600) 600 MG TABS tablet Take 600 mg by mouth daily with breakfast.    .  carvedilol (COREG) 6.25 MG tablet Take 6.25 mg by mouth 2 (two) times daily with a meal.    . cholestyramine (QUESTRAN) 4 G packet TAKE 1 PACKET BY MOUTH TWICE DAILY WITH A MEAL 60 each 11  . fluorouracil (EFUDEX) 5 % cream Apply 1 application topically as needed.    . isosorbide mononitrate (IMDUR) 30 MG 24 hr tablet TAKE ONE TABLET BY MOUTH ONCE DAILY 30 tablet 6  . lansoprazole (PREVACID) 30 MG capsule Take 30 mg by mouth daily.    Marland Kitchen losartan (COZAAR) 25 MG tablet Take 25 mg by mouth daily.    . mesalamine (PENTASA) 250 MG CR capsule TAKE TWO CAPSULES THREE TIMES DAILY 180 capsule 11  . Multiple Vitamins-Minerals (CENTRUM SILVER PO) Take 1 tablet by mouth daily.    . Potassium Bicarbonate 99 MG CAPS Take 1 capsule by mouth daily.    . Testosterone (ANDROGEL PUMP TD) Place 2 application onto the skin daily. 2 pumps once daily     . vitamin B-12 (CYANOCOBALAMIN) 1000  MCG tablet Take 1,000 mcg by mouth daily.     No current facility-administered medications on file prior to visit.    ROS:  Out of a complete 14 system review of symptoms, the patient complains only of the following symptoms, and all other reviewed systems are negative.  Fatigue Runny nose Shortness of breath Diarrhea, incontinence of bowel Incontinence of bladder, frequency of urination Walking difficulties Moles Dizziness Anxiety  Blood pressure 110/70, pulse 101.  Physical Exam  General: The patient is alert and cooperative at the time of the examination.  Skin: No significant peripheral edema is noted.   Neurologic Exam  Mental status: The patient is oriented x 3. Mini-Mental Status Examination done today shows a total score 28/30.  Cranial nerves: Facial symmetry is present. Speech is normal, no aphasia or dysarthria is noted. Extraocular movements are full. Visual fields are full. Masking of the face is seen.  Motor: The patient has good strength in all 4 extremities.  Sensory examination: Soft touch sensation is symmetric on the face, arms, and legs.  Coordination: The patient has good finger-nose-finger and heel-to-shin bilaterally.  Gait and station: The patient is able to arise from a seated position with arms crossed with some difficulty. Once up, the patient is able to ambulate independently, slightly stooped posture noted, decreased arm swing bilaterally. No tremor is seen with the arms. Tandem gait is slightly unsteady. Romberg is negative. No drift is seen.  Reflexes: Deep tendon reflexes are symmetric.   Assessment/Plan:  1. Parkinson's disease  2. Mild memory disturbance  The patient will go up on the Sinemet dose taking 1.5 tablets of the Sinemet 25/100 mg in the morning, and in the evening, one tablet at midday. He will follow-up in about 4 months. The memory issue appears to be stable, this will need to be followed.  Jill Alexanders  MD 04/04/2014 7:01 PM  [facility] [address] [place],  [address]  Phone [phone] Fax [phone]

## 2014-04-04 NOTE — Telephone Encounter (Signed)
The pt has had an increase in diarrhea, no blood or mucous.  Pt wife request later appt this week due to other MD appts the pt has this week.  Appt given with Amy for 04/06/14 230 pm.

## 2014-04-04 NOTE — Patient Instructions (Signed)
Parkinson Disease Parkinson disease is a disorder of the central nervous system, which includes the brain and spinal cord. A person with this disease slowly loses the ability to completely control body movements. Within the brain, there is a group of nerve cells (basal ganglia) that help control movement. The basal ganglia are damaged and do not work properly in a person with Parkinson disease. In addition, the basal ganglia produce and use a brain chemical called dopamine. The dopamine chemical sends messages to other parts of the body to control and coordinate body movements. Dopamine levels are low in a person with Parkinson disease. If the dopamine levels are low, then the body does not receive the correct messages it needs to move normally.  CAUSES  The exact reason why the basal ganglia get damaged is not known. Some medical researchers have thought that infection, genes, environment, and certain medicines may contribute to the cause.  SYMPTOMS   An early symptom of Parkinson disease is often an uncontrolled shaking (tremor) of the hands. The tremor will often disappear when the affected hand is consciously used.  As the disease progresses, walking, talking, getting out of a chair, and new movements become more difficult.  Muscles get stiff and movements become slower.  Balance and coordination become harder.  Depression, trouble swallowing, urinary problems, constipation, and sleep problems can occur.  Later in the disease, memory and thought processes may deteriorate. DIAGNOSIS  There are no specific tests to diagnose Parkinson disease. You may be referred to a neurologist for evaluation. Your caregiver will ask about your medical history, symptoms, and perform a physical exam. Blood tests and imaging tests of your brain may be performed to rule out other diseases. The imaging tests may include an MRI or a CT scan. TREATMENT  The goal of treatment is to relieve symptoms. Medicines may be  prescribed once the symptoms become troublesome. Medicine will not stop the progression of the disease, but medicine can make movement and balance better and help control tremors. Speech and occupational therapy may also be prescribed. Sometimes, surgical treatment of the brain can be done in young people. HOME CARE INSTRUCTIONS  Get regular exercise and rest periods during the day to help prevent exhaustion and depression.  If getting dressed becomes difficult, replace buttons and zippers with Velcro and elastic on your clothing.  Take all medicine as directed by your caregiver.  Install grab bars or railings in your home to prevent falls.  Go to speech or occupational therapy as directed.  Keep all follow-up visits as directed by your caregiver. SEEK MEDICAL CARE IF:  Your symptoms are not controlled with your medicine.  You fall.  You have trouble swallowing or choke on your food. MAKE SURE YOU:  Understand these instructions.  Will watch your condition.  Will get help right away if you are not doing well or get worse. Document Released: 03/22/2000 Document Revised: 07/20/2012 Document Reviewed: 04/24/2011 [facility] Patient Information 2015 [place], [place]. This information is not intended to replace advice given to you by your health care provider. Make sure you discuss any questions you have with your health care provider.

## 2014-04-06 ENCOUNTER — Encounter: Payer: Self-pay | Admitting: Physician Assistant

## 2014-04-06 ENCOUNTER — Ambulatory Visit (INDEPENDENT_AMBULATORY_CARE_PROVIDER_SITE_OTHER): Payer: Medicare Other | Admitting: Physician Assistant

## 2014-04-06 VITALS — BP 122/82 | HR 80 | Ht 68.0 in | Wt 179.2 lb

## 2014-04-06 DIAGNOSIS — R197 Diarrhea, unspecified: Secondary | ICD-10-CM

## 2014-04-06 DIAGNOSIS — K5080 Crohn's disease of both small and large intestine without complications: Secondary | ICD-10-CM

## 2014-04-06 MED ORDER — HYOSCYAMINE SULFATE 0.125 MG SL SUBL: 0.1250 mg | SUBLINGUAL_TABLET | SUBLINGUAL | Status: DC | PRN

## 2014-04-06 MED ORDER — BUDESONIDE 9 MG PO TB24: 9.0000 mg | ORAL_TABLET | Freq: Every day | ORAL | Status: DC

## 2014-04-06 NOTE — Progress Notes (Signed)
Patient ID: ANQUAN AZZARELLO, male   DOB: July 25, 1928, 80 y.o.   MRN: [id number]   Subjective:    Patient ID: ABDIEL BLACKERBY, male    DOB: March 26, 1929, 80 y.o.   MRN: [id number]  HPI Tyson is a pleasant 81 year old white male known to Dr. Ardis Hughs with long history of Crohn's ileocolitis. He is status post remote distal ileal and right colon resection. He had been on 6-MP for many years but this was stopped a few years back due to transaminitis. He has been maintained on Pentasa and Questran over the past couple of years. He had a partial small bowel obstruction in 2012 which resolved with conservative management and then had a recurrent obstruction in June 2014 for which she was hospitalized. CT at that time did not show any evidence of active Crohn's and this was felt more likely secondary to an adhesion. Other medical problems include coronary artery disease status post stent 2009 hypertension, Parkinson's disease, hyperlipidemia, diverticulosis. Patient comes in today with his wife stating that he has had more problems with diarrhea and urgency over the past few months and feels that his Crohn's may be "flaring". States that he usually gets a very short burst of steroids from his primary doctor Dr. Virgina Jock when he feels like he is having a flare or that a "blockage" may be coming on. Psych he takes prednisone for 3-4 days and then stops. He says he last did this about a month ago. His usual pattern has been to have 3-4 bowel movements per day over the past couple of months it sounds like he is having 6 or 7 bowel movements per day and always very urgent postprandially. He has had some accidents has not noted any blood has no complaints of abdominal pain or cramping no nausea appetite has been fine. No fever or chills. His wife is somewhat concerned as his abdomen seems to be more protuberant. She says he eats very well. Last colonoscopy 2006 showed mildly active Crohn's at the anastomosis and in the distal small  bowel anastomosis felt to be mildly stenotic. patient brought copies of labs from Dr. Keane Police office done earlier this month CBC and sCMET unremarkable.  Review of Systems Pertinent positive and negative review of systems were noted in the above HPI section.  All other review of systems was otherwise negative.  Outpatient Encounter Prescriptions as of 04/06/2014  Medication Sig  . acetaminophen (TYLENOL) 325 MG tablet Take 650 mg by mouth every 6 (six) hours as needed for pain.  Marland Kitchen aspirin EC 81 MG tablet Take 81 mg by mouth daily.  Marland Kitchen atorvastatin (LIPITOR) 40 MG tablet Take 40 mg by mouth daily.    . calcium carbonate (CALCIUM 600) 600 MG TABS tablet Take 600 mg by mouth daily with breakfast.  . carbidopa-levodopa (SINEMET IR) 25-100 MG per tablet 1.5 tablets in the morning and evening, one tablet at midday  . carvedilol (COREG) 6.25 MG tablet Take 6.25 mg by mouth 2 (two) times daily with a meal.  . cholestyramine (QUESTRAN) 4 G packet TAKE 1 PACKET BY MOUTH TWICE DAILY WITH A MEAL  . fluorouracil (EFUDEX) 5 % cream Apply 1 application topically as needed.  . isosorbide mononitrate (IMDUR) 30 MG 24 hr tablet TAKE ONE TABLET BY MOUTH ONCE DAILY  . lansoprazole (PREVACID) 30 MG capsule Take 30 mg by mouth daily.  Marland Kitchen losartan (COZAAR) 25 MG tablet Take 25 mg by mouth daily.  . mesalamine (PENTASA) 250 MG CR capsule TAKE  TWO CAPSULES THREE TIMES DAILY  . Multiple Vitamins-Minerals (CENTRUM SILVER PO) Take 1 tablet by mouth daily.  . nitroGLYCERIN (NITROSTAT) 0.4 MG SL tablet Place 0.4 mg under the tongue every 5 (five) minutes as needed for chest pain.  Marland Kitchen Potassium Bicarbonate 99 MG CAPS Take 1 capsule by mouth daily.  . Testosterone (ANDROGEL PUMP TD) Place 2 application onto the skin daily. 2 pumps once daily   . vitamin B-12 (CYANOCOBALAMIN) 1000 MCG tablet Take 1,000 mcg by mouth daily.  . Budesonide (UCERIS) 9 MG TB24 Take 9 mg by mouth daily.  . hyoscyamine (LEVSIN/SL) 0.125 MG SL tablet  Place 1 tablet (0.125 mg total) under the tongue every 4 (four) hours as needed.   No Known Allergies Patient Active Problem List   Diagnosis Date Noted  . Dehydration 09/28/2012  . Acute kidney injury 09/28/2012  . Memory loss 08/19/2012  . Paralysis agitans 08/19/2012  . Abnormality of gait 08/19/2012  . SBO (small bowel obstruction) 06/04/2011  . Dysphagia 05/24/2011  . Tinea corporis 01/04/2011  . Angina of effort 12/14/2010  . Fatigue 12/14/2010  . NOSEBLEED 05/23/2010  . NONSPECIFIC ABNORMAL RESULTS LIVR FUNCTION STUDY 10/20/2009  . CROHN'S [facility] INTESTINE 03/16/2008  . HYPERLIPIDEMIA 04/29/2007  . HYPERTENSION 04/29/2007  . CAD 04/29/2007  . GERD 04/29/2007  . CROHN'S DISEASE 04/29/2007  . SMALL BOWEL OBSTRUCTION 04/29/2007  . DIVERTICULOSIS, COLON 04/29/2007  . FATTY LIVER DISEASE 08/30/2006  . HEMORRHOIDS 03/26/2005  . ULCERATIVE ILEOCOLITIS 12/11/1999  . EROSIVE ESOPHAGITIS 09/29/1997  . HIATAL HERNIA 09/29/1997   History   Social History  . Marital Status: Married    Spouse Name: N/A    Number of Children: 3  . Years of Education: college   Occupational History  . CEO-retired   . CEO    Social History Main Topics  . Smoking status: Never Smoker   . Smokeless tobacco: Never Used  . Alcohol Use: Yes     Comment: occ.  . Drug Use: No  . Sexual Activity: Not on file   Other Topics Concern  . Not on file   Social History Narrative    Mr. Vandewater family history includes Colon cancer in his brother; Coronary artery disease in an other family member; Emphysema in his father; Heart disease in his brother and brother; Heart failure in his mother.      Objective:    Filed Vitals:   04/06/14 1451  BP: 122/82  Pulse: 80    Physical Exam  well-developed elderly white male in no acute distress, pleasant accompanied by his wife blood pressure 122/82 pulse 80 height 5 foot 8 weight 179. HEENT; nontraumatic normocephalic EOMI PERRLA sclera  anicteric Supple; no JVD, Cardiovascular; regular rate and rhythm with S1-S2 no murmur or gallop, Pulmonary ;clear bilaterally, Abdomen; protuberant soft and basically nontender no palpable mass or hepatosplenomegaly no fluid wave midline incisional scars, Rectal ;exam not done, 70s no clubbing cyanosis or edema skin warm and dry, Psych; mood and affect appropriate       Assessment & Plan:   #59 80 year old male with long history of Crohn's ileocolitis status post remote distal ileal and right colon resection with increase in diarrhea and postprandial urgency over the past few months. Suspect he does have active Crohn's #2 history of recurrent small bowel obstructions last episode in June 2014 felt secondary to adhesions and no evidence of active Crohn's on CT. Patient has had a somewhat stenotic anastomosis noted several years ago on colonoscopy. #3 coronary  artery disease #4 hypertension #5 hyperlipidemia #6 diverticulosis #7 Parkinson's  Plan; check sedimentation rate and CRP Continue Pentasa 250 mg 2 tablets 3 times daily Add a trial of Uceris  mg by mouth every morning 8-10 weeks Add trial of Levsin sublingual one half to one hour before meals Patient will continue Questran but suggested he use this once daily -twice daily as needed and always at least 2 hours away from his other meds Follow-up with Dr. Ardis Hughs or myself in 3-4 weeks.   Vonya Ohalloran S Ravynn Hogate PA-C 04/06/2014

## 2014-04-06 NOTE — Patient Instructions (Addendum)
Increase the Questran to 4 grams twice daily. TAKE 2 HOURS AWAY FROM OTHER MEDICATIONS.  Continue the Pentasa, 6 tabs daily. We sent prescriptions to [facility] for Uceris 9 mg and Levsin SL for cramping, diarrhea, urgency.  We have given you samples of Uceris 9 mg, take 1 tab daily. We have given you samples for 18 days and then a prescription for the remainder of the 2 months.  We made you an appointment with Dr Ardis Hughs for 05-30-2014 at 1:30 PM .

## 2014-04-09 NOTE — Progress Notes (Signed)
i agree with the note, plan above

## 2014-04-12 ENCOUNTER — Telehealth: Payer: Self-pay | Admitting: *Deleted

## 2014-04-12 NOTE — Telephone Encounter (Signed)
I called and left Mrs. [name] [name] know that we have the #42 tablets of Uceris the Drug rep, Catalina Antigua dropped them off today.  He husband came right away and picked the samples up.  Mr. Grattan did hand me the denial paperwork for the prior authorization for the Uceris. The insurance faxed me a copy of this paperwork also.

## 2014-04-15 ENCOUNTER — Encounter: Payer: Self-pay | Admitting: Internal Medicine

## 2014-04-15 ENCOUNTER — Encounter: Payer: Self-pay | Admitting: Physician Assistant

## 2014-04-22 ENCOUNTER — Ambulatory Visit (INDEPENDENT_AMBULATORY_CARE_PROVIDER_SITE_OTHER): Payer: Medicare Other | Admitting: Cardiology

## 2014-04-22 ENCOUNTER — Encounter: Payer: Self-pay | Admitting: Cardiology

## 2014-04-22 VITALS — BP 140/80 | HR 80 | Ht 70.0 in | Wt 178.1 lb

## 2014-04-22 DIAGNOSIS — R0609 Other forms of dyspnea: Secondary | ICD-10-CM

## 2014-04-22 DIAGNOSIS — I25118 Atherosclerotic heart disease of native coronary artery with other forms of angina pectoris: Secondary | ICD-10-CM

## 2014-04-22 DIAGNOSIS — I10 Essential (primary) hypertension: Secondary | ICD-10-CM

## 2014-04-22 DIAGNOSIS — R5382 Chronic fatigue, unspecified: Secondary | ICD-10-CM

## 2014-04-22 NOTE — Progress Notes (Signed)
[name] [name] Date of Birth: 1929-02-21 Medical Record #[id number]  History of Present Illness: [name] [name] is seen for evaluation of increased dyspnea.  He has a  history of CAD. He is s/p anterior STEMI in Jan 2009 at [facility] [place]. He had a BMS to the LAD. EF was initially 35-40% but subsequently returned to normal. Last myoview in Nov 2011 was normal with EF 71%. Last cath in Sept. 2013 showed a patent stent. 30-40% proximal LAD. EF 60% and normal EDP.  On follow up today he reports increasing symptoms of dyspnea. This is very noticeable with exertion. No chest pain. His legs are weak. His wife reports that he coughs constantly. He does have mild swelling late in the day. No change in weight. No PND or orthopnea. He does not use Advair regularly.   He is  limited by his Parkinsonism. He also has a history of Chron's disease.History of HTN, HL, and GERD.  Outpatient Prescriptions Prior to Visit  Medication Sig Dispense Refill  . acetaminophen (TYLENOL) 325 MG tablet Take 650 mg by mouth every 6 (six) hours as needed for pain.    Marland Kitchen aspirin EC 81 MG tablet Take 81 mg by mouth daily.    Marland Kitchen atorvastatin (LIPITOR) 40 MG tablet Take 40 mg by mouth daily.      . Budesonide (UCERIS) 9 MG TB24 Take 9 mg by mouth daily. 42 tablet 0  . calcium carbonate (CALCIUM 600) 600 MG TABS tablet Take 600 mg by mouth daily with breakfast.    . carbidopa-levodopa (SINEMET IR) 25-100 MG per tablet 1.5 tablets in the morning and evening, one tablet at midday 360 tablet 1  . carvedilol (COREG) 6.25 MG tablet Take 6.25 mg by mouth 2 (two) times daily with a meal.    . cholestyramine (QUESTRAN) 4 G packet TAKE 1 PACKET BY MOUTH TWICE DAILY WITH A MEAL 60 each 11  . fluorouracil (EFUDEX) 5 % cream Apply 1 application topically as needed.    . hyoscyamine (LEVSIN/SL) 0.125 MG SL tablet Place 1 tablet (0.125 mg total) under the tongue every 4 (four) hours as needed. 30 tablet 0  . isosorbide mononitrate (IMDUR) 30  MG 24 hr tablet TAKE ONE TABLET BY MOUTH ONCE DAILY 30 tablet 6  . lansoprazole (PREVACID) 30 MG capsule Take 30 mg by mouth daily.    Marland Kitchen losartan (COZAAR) 25 MG tablet Take 25 mg by mouth daily.    . mesalamine (PENTASA) 250 MG CR capsule TAKE TWO CAPSULES THREE TIMES DAILY 180 capsule 11  . Multiple Vitamins-Minerals (CENTRUM SILVER PO) Take 1 tablet by mouth daily.    . nitroGLYCERIN (NITROSTAT) 0.4 MG SL tablet Place 0.4 mg under the tongue every 5 (five) minutes as needed for chest pain.    Marland Kitchen Potassium Bicarbonate 99 MG CAPS Take 1 capsule by mouth daily.    . Testosterone (ANDROGEL PUMP TD) Place 2 application onto the skin daily. 2 pumps once daily     . vitamin B-12 (CYANOCOBALAMIN) 1000 MCG tablet Take 1,000 mcg by mouth daily.     No facility-administered medications prior to visit.    No Known Allergies  Past Medical History  Diagnosis Date  . Hypertension   . Hyperlipidemia   . Coronary artery disease     s/p ANTERIOR STEMI 1/09 at [facility]; treated with a bare-metal stent to the LAD; Myoview 11/11 EF 71%, no scar, no ischemia;     cath 12/20/10:   pLAD  30-40%, stent ok, ? If stent oversized, EF 60% and low LVEDP  . GERD (gastroesophageal reflux disease)   . Other esophagitis   . Diaphragmatic hernia without mention of obstruction or gangrene   . Ulcerative (chronic) ileocolitis   . Diverticulosis of colon (without mention of hemorrhage)   . Other chronic nonalcoholic liver disease   . Regional enteritis of small intestine   . Flatulence, eructation, and gas pain   . Ischemic cardiomyopathy     EF initially 35-40% after MI 1/09; echo 7/08: EF 60%  . Fatty liver     on ultrasound of 10/2009  . Parkinson's disease   . Gait disorder   . Memory disorder   . Osteoporosis   . Renal calculi   . Melanoma   . RBBB 09/02/2013    Past Surgical History  Procedure Laterality Date  . Cardiac catheterization  09/25/06    EF 35-40% but more recently 60%  . Cholecystectomy    .  Appendectomy    . Back surgery      L3, L4, L5  . Tonsillectomy    . Partial bowel resection    . Coronary artery stent placement    . Melanoma resection    . Cataract extraction      Bilateral    History   Social History  . Marital Status: Married    Spouse Name: N/A    Number of Children: 3  . Years of Education: college   Occupational History  . CEO-retired   . CEO    Social History Main Topics  . Smoking status: Never Smoker   . Smokeless tobacco: Never Used  . Alcohol Use: Yes     Comment: occ.  . Drug Use: No  . Sexual Activity: None   Other Topics Concern  . None   Social History Narrative    Family History  Problem Relation Age of Onset  . Coronary artery disease    . Heart failure Mother   . Emphysema Father   . Heart disease Brother   . Heart disease Brother   . Colon cancer Brother     Review of Systems: As noted in HPI.  All other systems were reviewed and are negative.  Physical Exam: BP 140/80 mmHg  Pulse 80  Ht 5' 10"  (1.778 m)  Wt 178 lb 1.6 oz (80.786 kg)  BMI 25.55 kg/m2 Filed Weights   04/22/14 1117  Weight: 178 lb 1.6 oz (80.786 kg)  GENERAL:  Well appearing WM in NAD HEENT:  PERRL, EOMI, sclera are clear. Oropharynx is clear. NECK:  No jugular venous distention, carotid upstroke brisk and symmetric, no bruits, no thyromegaly or adenopathy LUNGS:  Clear to auscultation bilaterally CHEST:  Unremarkable HEART:  RRR,  PMI not displaced or sustained,S1 and S2 within normal limits, no S3, no S4: no clicks, no rubs, no murmurs ABD:  Soft, nontender. BS +, no masses or bruits. No hepatomegaly, no splenomegaly EXT:  2 + pulses throughout, no edema, no cyanosis no clubbing SKIN:  Warm and dry.  No rashes NEURO:  Alert and oriented x 3. Cranial nerves II through XII intact. Mild tremor. PSYCH:  Cognitively intact    LABORATORY DATA:  Labs from Dr. Virgina Jock reviewed from 03/14/14. Normal chemistry panel and CBC.   Assessment /  Plan: 1. CAD with prior Anterior STEMI Jan 2009 treated with BMS of LAD. Follow up cath 9/13 showed patent stent and no obstructive disease. Normal LV function. Continue ASA, statin,  and coreg.   2. Dyspnea on exertion. Need to rule out recurrent ischemia or LV dysfunction. This may be more related to pulmonary issues. Will recommend he use Advair BID. He does not appear volume overloaded today. Will schedule for a Lexiscan myoview and an Echocardiogram. I wonder if he has more functional limitation from his Parkinson's disease.  3. Hyperlipidemia. Excellent control on statin.   4. HTN controlled.   5. Parkinson's disease.

## 2014-04-22 NOTE — Patient Instructions (Signed)
We will schedule you for a nuclear stress test and Echocardiogram  Use your Advair inhaler at least twice a day.  Continue your other therapy

## 2014-05-04 ENCOUNTER — Telehealth (HOSPITAL_COMMUNITY): Payer: Self-pay

## 2014-05-04 NOTE — Telephone Encounter (Signed)
Encounter complete. 

## 2014-05-06 ENCOUNTER — Ambulatory Visit (HOSPITAL_COMMUNITY)
Admission: RE | Admit: 2014-05-06 | Discharge: 2014-05-06 | Disposition: A | Discharge status: Home / Self Care | Payer: Medicare Other | Source: Ambulatory Visit | Attending: Internal Medicine | Admitting: Internal Medicine

## 2014-05-06 DIAGNOSIS — E669 Obesity, unspecified: Secondary | ICD-10-CM | POA: Insufficient documentation

## 2014-05-06 DIAGNOSIS — R5382 Chronic fatigue, unspecified: Secondary | ICD-10-CM

## 2014-05-06 DIAGNOSIS — R5383 Other fatigue: Secondary | ICD-10-CM | POA: Insufficient documentation

## 2014-05-06 DIAGNOSIS — Z8249 Family history of ischemic heart disease and other diseases of the circulatory system: Secondary | ICD-10-CM | POA: Insufficient documentation

## 2014-05-06 DIAGNOSIS — I10 Essential (primary) hypertension: Secondary | ICD-10-CM

## 2014-05-06 DIAGNOSIS — E785 Hyperlipidemia, unspecified: Secondary | ICD-10-CM | POA: Insufficient documentation

## 2014-05-06 DIAGNOSIS — R0609 Other forms of dyspnea: Secondary | ICD-10-CM | POA: Insufficient documentation

## 2014-05-06 DIAGNOSIS — I25118 Atherosclerotic heart disease of native coronary artery with other forms of angina pectoris: Secondary | ICD-10-CM

## 2014-05-06 DIAGNOSIS — R42 Dizziness and giddiness: Secondary | ICD-10-CM | POA: Diagnosis not present

## 2014-05-06 DIAGNOSIS — I2510 Atherosclerotic heart disease of native coronary artery without angina pectoris: Secondary | ICD-10-CM

## 2014-05-06 MED ORDER — TECHNETIUM TC 99M SESTAMIBI GENERIC - CARDIOLITE
10.2000 | Freq: Once | INTRAVENOUS | Status: AC | PRN
  Administered 2014-05-06: 10 via INTRAVENOUS

## 2014-05-06 MED ORDER — TECHNETIUM TC 99M SESTAMIBI GENERIC - CARDIOLITE
31.6000 | Freq: Once | INTRAVENOUS | Status: AC | PRN
  Administered 2014-05-06: 32 via INTRAVENOUS

## 2014-05-06 MED ORDER — REGADENOSON 0.4 MG/5ML IV SOLN
0.4000 mg | Freq: Once | INTRAVENOUS | Status: AC
  Administered 2014-05-06: 0.4 mg via INTRAVENOUS

## 2014-05-06 NOTE — Procedures (Addendum)
[facility] [address] [place] [address] [phone]  Cardiology Nuclear Med Study  [name] [name] is a 81 y.o. male     MRN : [id number]     DOB: 1928/04/21  Procedure Date: 05/06/2014  Nuclear Med Background Indication for Stress Test:  Follow up CAD;Ischemic cardiomyopathy History:  CAD;RBBB;COLON DVT;STEMI-2009;STENT/PTCA- X2;Last NUC MPI on 03/06/2010-normal;EF=71% Cardiac Risk Factors: Family History - CAD, Hypertension, Lipids and Overweight  Symptoms:  DOE, Fatigue, Light-Headedness and SOB   Nuclear Pre-Procedure Caffeine/Decaff Intake:  9:00pm NPO After: 7:00am   IV Site: R Hand  IV 0.9% NS with Angio Cath:  22g  Chest Size (in):  46" IV Started by: Rolene Course, RN  Height: 5' 10"  (1.778 m)  Cup Size: n/a  BMI:  Body mass index is 25.54 kg/(m^2). Weight:  178 lb (80.74 kg)   Tech Comments:  n/a    Nuclear Med Study 1 or 2 day study: 1 day  Stress Test Type:  [place] Provider:  Peter [name], MD   Resting Radionuclide: Technetium 71mSestamibi  Resting Radionuclide Dose:10.2 mCi   Stress Radionuclide:  Technetium 934mestamibi  Stress Radionuclide Dose: 31.6 mCi           Stress Protocol Rest HR: 77 Stress HR: 90  Rest BP: 129/84 Stress BP: 125/76  Exercise Time (min): n/a METS: n/a   Predicted Max HR: 137 bpm % Max HR: 7.3 bpm Rate Pressure Product: 1290  Dose of Adenosine (mg):  n/a Dose of Lexiscan: 0.4 mg  Dose of Atropine (mg): n/a Dose of Dobutamine: n/a mcg/kg/min (at max HR)  Stress Test Technologist: TeLeane ParaCCT Nuclear Technologist: PaImagene RichesCNMT   Rest Procedure:  Myocardial perfusion imaging was performed at rest 45 minutes following the intravenous administration of Technetium 9932mstamibi. Stress Procedure:  The patient received IV Lexiscan 0.4 mg over 15-seconds.  Technetium 70m41mtamibi injected IV at 30-seconds.  There were no significant  changes with Lexiscan.  Quantitative spect images were obtained after a 45 minute delay.  Transient Ischemic Dilatation (Normal <1.22):  1.09  QGS EDV:  67 ml QGS ESV:  26 ml LV Ejection Fraction: 60%       Rest ECG: NSR-RBBB  Stress ECG: No significant change from baseline ECG  QPS Raw Data Images:  Normal; no motion artifact; normal heart/lung ratio. Stress Images:  Normal homogeneous uptake in all areas of the myocardium. Rest Images:  Normal homogeneous uptake in all areas of the myocardium. Subtraction (SDS):  No evidence of ischemia.  Impression Exercise Capacity:  Lexiscan with no exercise. BP Response:  Normal blood pressure response. Clinical Symptoms:  No significant symptoms noted. ECG Impression:  No significant ST segment change suggestive of ischemia. Comparison with Prior Nuclear Study: No significant change from previous study  Overall Impression:  Normal stress nuclear study.  LV Wall Motion:  NL LV Function; NL Wall Motion   BERRLorretta Harp  05/06/2014 2:44 PM

## 2014-05-06 NOTE — Progress Notes (Signed)
2D Echocardiogram Complete.  05/06/2014   [name] [name], [place]

## 2014-05-09 ENCOUNTER — Ambulatory Visit: Payer: Medicare Other | Admitting: Neurology

## 2014-05-30 ENCOUNTER — Other Ambulatory Visit (INDEPENDENT_AMBULATORY_CARE_PROVIDER_SITE_OTHER): Payer: Medicare Other

## 2014-05-30 ENCOUNTER — Ambulatory Visit (INDEPENDENT_AMBULATORY_CARE_PROVIDER_SITE_OTHER): Payer: Medicare Other | Admitting: Gastroenterology

## 2014-05-30 ENCOUNTER — Encounter: Payer: Self-pay | Admitting: Gastroenterology

## 2014-05-30 VITALS — BP 124/70 | HR 80 | Ht 68.0 in | Wt 179.4 lb

## 2014-05-30 DIAGNOSIS — K5090 Crohn's disease, unspecified, without complications: Secondary | ICD-10-CM

## 2014-05-30 LAB — BASIC METABOLIC PANEL
BUN: 12 mg/dL (ref 6–23)
CO2: 28 mEq/L (ref 19–32)
Calcium: 8.9 mg/dL (ref 8.4–10.5)
Chloride: 103 mEq/L (ref 96–112)
Creatinine, Ser: 1.05 mg/dL (ref 0.40–1.50)
GFR: 71.63 mL/min (ref >60.00)
Glucose, Bld: 137 mg/dL — ABNORMAL HIGH (ref 70–99)
Potassium: 3.6 mEq/L (ref 3.5–5.1)
Sodium: 138 mEq/L (ref 135–145)

## 2014-05-30 LAB — SEDIMENTATION RATE: Sed Rate: 9 mm/hr (ref 0–22)

## 2014-05-30 MED ORDER — PREDNISONE 10 MG PO TABS: 20.0000 mg | ORAL_TABLET | Freq: Two times a day (BID) | ORAL | Status: DC

## 2014-05-30 NOTE — Progress Notes (Signed)
GI PROBLEM LIST:  1. Crohn's disease. Distant ileal and right colon resection by Dr. Gretta Cool in the very distant past. He was maintained on Pentasa, Entocort, and 77m of Purinethol for several years under the care of Dr. SLyla Son Hospitalization, May 2008, for acute myocardial infarction complicated by small bowel obstruction likely due to active Crohn's. Increased Purinethol to 100 mg daily and liver tests increased as well. Purinethol held and liver test normalized. The patient felt much better overall as well (less diarrhea, less fatigue). Winter, 2009: currently on 6 pills of Pentasa day feeling quite well overall. Summer, 2010 postoperative ileus following spine surgery (doubt active Crohn's flare). November, 2011: started cholestyramine with very good results (loose stools MUCH improved, only going 3-4 times a day). 03/2014 rov with increasing loose stools, urgency; started on uceris, levsin. 2. Dysphagia February 2012: Barium esophagram showed mild narrowing at his GE junction. Was planning to perform dilation off Plavix (if okay with his cardiologist) when he had a bowel obstruction.  3. partial small bowel obstruction, February 2012. CT suggested transition point in the pelvis. Not clear if there was active Crohn's disease contributing to this, however he was put on prednisone in hosp; started 338ma day, taper off quickly. Recurrent obstruction 09/2012: CT scan IMPRESSION: Small bowel obstruction with apparent change in caliber within the anterior right lower quadrant - right upper pelvis most likely due to an adhesion. Was admitted to hosp, put on steroids empirically however seems more likely to have been adhesive related than due to active IBD.   HPI: [name] whom I last saw 5 months ago. He is here with his wife today.  He was in our office about 6 weeks ago with worsening diarrhea, abdominal discomfort. Some lethargy.  He was started on budesonide,  antispasm medicine. He did not get the blood tests which we recommended.  Weight stable since 1-2 months ago at last visit.  Extreme diarrhea.  After every meal.  Has been on uceris daily for about 6 weeks, increased his cholestyramine to daily as well.  Non bloody diarrhea.  Not on imodium.  Past Medical History  Diagnosis Date  . Hypertension   . Hyperlipidemia   . Coronary artery disease     [facility]; Myoview 11/11 EF 71%, no scar, no ischemia;     cath 12/20/10:   pLAD 30-40%, stent ok, ? If stent oversized, EF 60% and low LVEDP  . GERD (gastroesophageal reflux disease)   . Other esophagitis   . Diaphragmatic hernia without mention of obstruction or gangrene   . Ulcerative (chronic) ileocolitis   . Diverticulosis of colon (without mention of hemorrhage)   . Other chronic nonalcoholic liver disease   . Regional enteritis of small intestine   . Flatulence, eructation, and gas pain   . Ischemic cardiomyopathy     EF initially 35-40% after MI 1/09; echo 7/08: EF 60%  . Fatty liver     on ultrasound of 10/2009  . Parkinson's disease   . Gait disorder   . Memory disorder   . Osteoporosis   . Renal calculi   . Melanoma   . RBBB 09/02/2013    Past Surgical History  Procedure Laterality Date  . Cardiac catheterization  09/25/06    EF 35-40% but more recently 60%  . Cholecystectomy    . Appendectomy    .  Back surgery      L3, L4, L5  . Tonsillectomy    . Partial bowel resection    . Coronary artery stent placement    . Melanoma resection    . Cataract extraction      Bilateral    Current Outpatient Prescriptions  Medication Sig Dispense Refill  . acetaminophen (TYLENOL) 325 MG tablet Take 650 mg by mouth every 6 (six) hours as needed for pain.    Marland Kitchen ADVAIR DISKUS 250-50 MCG/DOSE AEPB     . ANDROGEL PUMP 20.25 MG/ACT (1.62%) GEL     . aspirin EC 81 MG tablet Take 81 mg by mouth daily.    Marland Kitchen  atorvastatin (LIPITOR) 40 MG tablet Take 40 mg by mouth daily.      . Budesonide (UCERIS) 9 MG TB24 Take 9 mg by mouth daily. 42 tablet 0  . calcium carbonate (CALCIUM 600) 600 MG TABS tablet Take 600 mg by mouth daily with breakfast.    . carbidopa-levodopa (SINEMET IR) 25-100 MG per tablet 1.5 tablets in the morning and evening, one tablet at midday 360 tablet 1  . carvedilol (COREG) 6.25 MG tablet Take 6.25 mg by mouth 2 (two) times daily with a meal.    . cholestyramine (QUESTRAN) 4 G packet TAKE 1 PACKET BY MOUTH TWICE DAILY WITH A MEAL 60 each 11  . fluorouracil (EFUDEX) 5 % cream Apply 1 application topically as needed.    . hyoscyamine (LEVSIN/SL) 0.125 MG SL tablet Place 1 tablet (0.125 mg total) under the tongue every 4 (four) hours as needed. 30 tablet 0  . isosorbide mononitrate (IMDUR) 30 MG 24 hr tablet TAKE ONE TABLET BY MOUTH ONCE DAILY 30 tablet 6  . lansoprazole (PREVACID) 30 MG capsule Take 30 mg by mouth daily.    Marland Kitchen losartan (COZAAR) 25 MG tablet Take 25 mg by mouth daily.    . mesalamine (PENTASA) 250 MG CR capsule TAKE TWO CAPSULES THREE TIMES DAILY 180 capsule 11  . Multiple Vitamins-Minerals (CENTRUM SILVER PO) Take 1 tablet by mouth daily.    . nitroGLYCERIN (NITROSTAT) 0.4 MG SL tablet Place 0.4 mg under the tongue every 5 (five) minutes as needed for chest pain.    Marland Kitchen Potassium Bicarbonate 99 MG CAPS Take 1 capsule by mouth daily.    . Testosterone (ANDROGEL PUMP TD) Place 2 application onto the skin daily. 2 pumps once daily     . vitamin B-12 (CYANOCOBALAMIN) 1000 MCG tablet Take 1,000 mcg by mouth daily.     No current facility-administered medications for this visit.    Allergies as of 05/30/2014  . (No Known Allergies)    Family History  Problem Relation Age of Onset  . Coronary artery disease    . Heart failure Mother   . Emphysema Father   . Heart disease Brother   . Heart disease Brother   . Colon cancer Brother     History   Social History  .  Marital Status: Married    Spouse Name: N/A  . Number of Children: 3  . Years of Education: college   Occupational History  . CEO-retired   . CEO    Social History Main Topics  . Smoking status: Never Smoker   . Smokeless tobacco: Never Used  . Alcohol Use: Yes     Comment: occ.  . Drug Use: No  . Sexual Activity: Not on file   Other Topics Concern  . Not on file   Social History  Narrative      Physical Exam: BP 124/70 mmHg  Pulse 80  Ht 5' 8"  (1.727 m)  Wt 179 lb 6 oz (81.364 kg)  BMI 27.28 kg/m2 Constitutional: generally well-appearing Psychiatric: alert and oriented x3 Abdomen: soft, nontender, nondistended, no obvious ascites, no peritoneal signs, normal bowel sounds     Assessment and plan: 81 y.o. male with  Crohn's ileocolitis   He probably is flaring. He has never really had normal bowel movements and for years has had some urgency, frequency that he simply just dealt with.  I'm not sure he is very far off his baseline now. Clearly his budesonide has not helped at all and so I'm going to stop that. I'm going to start him on prednisone 20 mg twice daily and he will call to report on his response in 7 days. He understands not to taper prior to then. He will also get a set of labs including a CBC, basic metabolic profile, stool pathogen panel. He will at least be seeing me again in 6 weeks and sooner if needed.

## 2014-05-30 NOTE — Patient Instructions (Addendum)
You will have labs checked today in the basement lab.  Please head down after you check out with the front desk  (esr, bmet, stool for gi pathogen panel) Short course of steroids (prednisone 8m pills, two pills twice daily). Call here in 1 weeks without any decreasing of your medicines. Stop the uceris. Please return to see Dr. JArdis Hughson 08/03/14 at 2:15 pm.

## 2014-06-06 ENCOUNTER — Telehealth: Payer: Self-pay | Admitting: Gastroenterology

## 2014-06-06 LAB — OTHER SOLSTAS TEST: Miscellaneous Test: 65008

## 2014-06-06 NOTE — Telephone Encounter (Signed)
   Please call him. Stool testing was all negative. Ask how his diarrhea has responded to prednisone that he started last week?

## 2014-06-06 NOTE — Telephone Encounter (Signed)
Pt has been notified and will call back to set up appt.

## 2014-06-06 NOTE — Telephone Encounter (Signed)
Left message on machine to call back  

## 2014-06-06 NOTE — Telephone Encounter (Signed)
Should start a slow taper: beginning on Wednesday he should decrease prednisone by 53m every week.  He needs rov with me in 2 months, sooner if any problems.

## 2014-06-06 NOTE — Telephone Encounter (Signed)
Pt states he feels a lot better only has 3 stools daily.  Should he stay on prednisone or taper?

## 2014-06-20 ENCOUNTER — Telehealth: Payer: Self-pay | Admitting: Gastroenterology

## 2014-06-20 NOTE — Telephone Encounter (Signed)
Left message for pt to call back  °

## 2014-06-23 ENCOUNTER — Telehealth: Payer: Self-pay | Admitting: Gastroenterology

## 2014-06-23 NOTE — Telephone Encounter (Signed)
Pt states the diarrhea is better and he will call back if it returns

## 2014-06-24 NOTE — Telephone Encounter (Signed)
Left message on machine to call back  

## 2014-06-24 NOTE — Telephone Encounter (Signed)
Wants to know what to do when he gets the severe diarrhea? Call back to cell # [phone]

## 2014-06-27 NOTE — Telephone Encounter (Signed)
Pt's wife is returning call

## 2014-06-27 NOTE — Telephone Encounter (Signed)
Pt called and states that his diarrhea came back and he started taking prednisone again and is tapering off now. States the diarrhea is better now and he will call back with any other issues. Dr. Ardis Hughs notified.

## 2014-06-27 NOTE — Telephone Encounter (Signed)
Ok, thanks.

## 2014-07-04 ENCOUNTER — Telehealth: Payer: Self-pay | Admitting: Gastroenterology

## 2014-07-04 NOTE — Telephone Encounter (Signed)
Pts wife states pt is having a crohn's flare, state he is having diarrhea all the time. Reports he wears depends and could barely make it back from dinner the other night. Pt called last Monday and reported that he had started back on prednisone and was tapering off, that the diarrhea was better. Wife states he is not a good caretaker of him self. Pt scheduled to see Tye Savoy NP 07/06/14@10am . Pts wife aware of appt.

## 2014-07-06 ENCOUNTER — Ambulatory Visit (INDEPENDENT_AMBULATORY_CARE_PROVIDER_SITE_OTHER): Payer: Medicare Other | Admitting: Nurse Practitioner

## 2014-07-06 ENCOUNTER — Encounter: Payer: Self-pay | Admitting: Nurse Practitioner

## 2014-07-06 VITALS — BP 124/78 | HR 88 | Ht 70.0 in | Wt 180.5 lb

## 2014-07-06 DIAGNOSIS — K50919 Crohn's disease, unspecified, with unspecified complications: Secondary | ICD-10-CM

## 2014-07-06 MED ORDER — POLYETHYLENE GLYCOL 3350 17 GM/SCOOP PO POWD: ORAL | Status: DC

## 2014-07-06 NOTE — Patient Instructions (Signed)
Take Prednisone 20 mg daily until further notice. You have been scheduled for a colonoscopy. Please follow written instructions given to you at your visit today.  Please pick up your prep supplies at the pharmacy within the next 1-3 days. [facility]. If you use inhalers (even only as needed), please bring them with you on the day of your procedure. Your physician has requested that you go to www.startemmi.com and enter the access code given to you at your visit today. This web site gives a general overview about your procedure. However, you should still follow specific instructions given to you by our office regarding your preparation for the procedure.

## 2014-07-06 NOTE — Progress Notes (Signed)
     History of Present Illness:   Patient is a 81 year old male known to Dr. Ardis Hughs. He has a history of Crohn's disease, status post remote ileal and right colon resection. Patient was seen December 2015 with increasing loose stools and urgency. Uceris was added to his Pentasa. At follow-up visit late February symptoms not much better. Uceris was stopped. prednisone 20 mg twice daily was started. Labs at that visit fairly unremarkable with a sedimentation rate of 9. Stool pathogen panel was negative.  Patient tapered prednisone and completed therapy by mid-March. Patient feels he did improve on prednisone, wife does not think so. Diarrhea recently recurred, patient restarted prednisone 20 mg twice a day. Unfortunately he tapered the dose too rapidly (sounds like he tapered by 37m/day).   Patient is here with wife who helps with history. He is having up to 6 loose bowel movements a day. Most of the diarrhea occurs with, or just after meals. He is now having some nocturnal diarrhea as well and is often incontinent of stool. No abdominal pain. No rectal bleeding. He has not started any new medications recently (other than what we prescribed)   Current Medications, Allergies, Past Medical History, Past Surgical History, Family History and Social History were reviewed in [employer].  Physical Exam: General: Pleasant, well developed , white male in no acute distress Head: Normocephalic and atraumatic Eyes:  sclerae anicteric, conjunctiva pink  Ears: Normal auditory acuity Lungs: Clear throughout to auscultation Heart: Regular rate and rhythm Abdomen: Soft, non distended, non-tender. No masses, no hepatomegaly. Normal bowel sounds Musculoskeletal: Symmetrical with no gross deformities  Extremities: No edema  Neurological: Alert oriented x 4, grossly nonfocal Psychological:  Alert and cooperative. Normal mood and affect  Assessment and Recommendations:  81year old  male with long-standing Crohn's disease, status post remote ileal and right colon resection. Since December patient has been having significant diarrhea. Currently on Pentasa. He was intolerant to purinethol (elevated LFTs). Budesonide didn't help. A course of prednisone did not seem to have a dramatic effect but I am not sure patient took it as directed.   Last colonoscopy was 2006. Since patient is not responding to treatment I think it is reasonable to proceed with colonoscopy to reevaluate his disease. In the interim I have asked patient to restart prednisone 20 mg twice a day and to take this amount every day until his colonoscopy which will be April 5th. The risks, benefits, and alternatives to colonoscopy with possible biopsy and possible polypectomy were discussed with the patient and he consents to proceed.

## 2014-07-08 NOTE — Progress Notes (Signed)
I agree with the above note, plan 

## 2014-07-12 ENCOUNTER — Encounter: Payer: Self-pay | Admitting: Gastroenterology

## 2014-07-12 ENCOUNTER — Ambulatory Visit (AMBULATORY_SURGERY_CENTER): Payer: Medicare Other | Admitting: Gastroenterology

## 2014-07-12 VITALS — BP 137/82 | HR 75 | Temp 97.2°F | Resp 20 | Ht 70.0 in | Wt 180.0 lb

## 2014-07-12 DIAGNOSIS — K50919 Crohn's disease, unspecified, with unspecified complications: Secondary | ICD-10-CM

## 2014-07-12 DIAGNOSIS — K9189 Other postprocedural complications and disorders of digestive system: Secondary | ICD-10-CM | POA: Diagnosis not present

## 2014-07-12 MED ORDER — SODIUM CHLORIDE 0.9 % IV SOLN: 500.0000 mL | INTRAVENOUS | Status: DC

## 2014-07-12 NOTE — Progress Notes (Signed)
Report to PACU, RN, vss, BBS= Clear.  

## 2014-07-12 NOTE — Progress Notes (Signed)
Called to room to assist during endoscopic procedure.  Patient ID and intended procedure confirmed with present staff. Received instructions for my participation in the procedure from the performing physician.  

## 2014-07-12 NOTE — Patient Instructions (Signed)
YOU HAD AN ENDOSCOPIC PROCEDURE TODAY AT [facility]:   Refer to the procedure report that was given to you for any specific questions about what was found during the examination.  If the procedure report does not answer your questions, please call your gastroenterologist to clarify.  If you requested that your care partner not be given the details of your procedure findings, then the procedure report has been included in a sealed envelope for you to review at your convenience later.  YOU SHOULD EXPECT: Some feelings of bloating in the abdomen. Passage of more gas than usual.  Walking can help get rid of the air that was put into your GI tract during the procedure and reduce the bloating. If you had a lower endoscopy (such as a colonoscopy or flexible sigmoidoscopy) you may notice spotting of blood in your stool or on the toilet paper. If you underwent a bowel prep for your procedure, you may not have a normal bowel movement for a few days.  Please Note:  You might notice some irritation and congestion in your nose or some drainage.  This is from the oxygen used during your procedure.  There is no need for concern and it should clear up in a day or so.  SYMPTOMS TO REPORT IMMEDIATELY:   Following lower endoscopy (colonoscopy or flexible sigmoidoscopy):  Excessive amounts of blood in the stool  Significant tenderness or worsening of abdominal pains  Swelling of the abdomen that is new, acute  Fever of 100F or higher   For urgent or emergent issues, a gastroenterologist can be reached at any hour by calling [phone].   DIET: Your first meal following the procedure should be a small meal and then it is ok to progress to your normal diet. Heavy or fried foods are harder to digest and may make you feel nauseous or bloated.  Likewise, meals heavy in dairy and vegetables can increase bloating.  Drink plenty of fluids but you should avoid alcoholic beverages for 24  hours.  ACTIVITY:  You should plan to take it easy for the rest of today and you should NOT DRIVE or use heavy machinery until tomorrow (because of the sedation medicines used during the test).    FOLLOW UP: Our staff will call the number listed on your records the next business day following your procedure to check on you and address any questions or concerns that you may have regarding the information given to you following your procedure. If we do not reach you, we will leave a message.  However, if you are feeling well and you are not experiencing any problems, there is no need to return our call.  We will assume that you have returned to your regular daily activities without incident.  If any biopsies were taken you will be contacted by phone or by letter within the next 1-3 weeks.  Please call us at [phone] if you have not heard about the biopsies in 3 weeks.    SIGNATURES/CONFIDENTIALITY: You and/or your care partner have signed paperwork which will be entered into your electronic medical record.  These signatures attest to the fact that that the information above on your After Visit Summary has been reviewed and is understood.  Full responsibility of the confidentiality of this discharge information lies with you and/or your care-partner.   Resume medications.

## 2014-07-12 NOTE — Op Note (Signed)
[place]  [address]. [place], [address]   COLONOSCOPY PROCEDURE REPORT  PATIENT: [name], [name]  MR#: [id number] BIRTHDATE: [birth year] , 78  yrs. old GENDER: male ENDOSCOPIST: [name], MD PROCEDURE DATE:  07/12/2014 PROCEDURE:   Colonoscopy with biopsy First Screening Colonoscopy - Avg.  risk and is 50 yrs.  old or older - No.  Prior Negative Screening - Now for repeat screening. N/A  History of Adenoma - Now for follow-up colonoscopy & has been > or = to 3 yrs.  N/A ASA CLASS:   Class III INDICATIONS:long standing Crohn's, remote ileocecectomy; worsening diarrhea. MEDICATIONS: Monitored anesthesia care and Propofol 200 mg IV  DESCRIPTION OF PROCEDURE:   After the risks benefits and alternatives of the procedure were thoroughly explained, informed consent was obtained.  The digital rectal exam revealed no abnormalities of the rectum.   The [id number]  endoscope was introduced through the anus and advanced to the surgical anastomosis. No adverse events experienced.   The quality of the prep was good.  The instrument was then slowly withdrawn as the colon was fully examined.   COLON FINDINGS: There were diverticulum throughout the colon.  There was a 1.5cm ulcer at the apparent ileocecal anastomosis.  The lumen through the anastomosis was narrowed (to 4-64m) with obvious edema, inflammation.  ? Underlying fixed component to the narrowing.  I could not advance the colonscope through the anastomosis.  Multiple biopsies were taken.  Retroflexed views revealed no abnormalities. The time to cecum =    Withdrawal time =      The scope was withdrawn and the procedure completed. COMPLICATIONS: There were no immediate complications.  ENDOSCOPIC IMPRESSION: There were diverticulum throughout the colon.  There was a 1.5cm ulcer at the apparent ileocecal anastomosis.  The lumen through the anastomosis was narrowed (to 4-515m with obvious  edema, inflammation.  ? Underlying fixed component to the narrowing.  I could not advance the colonscope through the anastomosis.  Multiple biopsies were taken  RECOMMENDATIONS: Continue 20108mrednisone twice daily for now.  Will need to consider adding biologic treatment (such as remicade) since you are intolerant of immunomodulators such as purenithol (elevated liver tests).  Continue taking once to twice daily cholestyramine.  eSigned:  DanMilus BanisterD 07/12/2014 2:32 PM   cc: JohShon BatonD

## 2014-07-13 ENCOUNTER — Telehealth: Payer: Self-pay | Admitting: *Deleted

## 2014-07-13 NOTE — Telephone Encounter (Signed)
  Follow up Call-  Call back number 07/12/2014  Post procedure Call Back phone  # [phone]  Permission to leave phone message Yes     Patient questions:  Do you have a fever, pain , or abdominal swelling? No. Pain Score  0 *  Have you tolerated food without any problems? Yes.    Have you been able to return to your normal activities? Yes.    Do you have any questions about your discharge instructions: Diet   No. Medications  No. Follow up visit  No.  Do you have questions or concerns about your Care? No.  Actions: * If pain score is 4 or above: No action needed, pain <4.  Spoke with wife, she states he feels better since having the colonoscopy.

## 2014-07-15 ENCOUNTER — Other Ambulatory Visit: Payer: Self-pay | Admitting: Gastroenterology

## 2014-07-18 ENCOUNTER — Telehealth: Payer: Self-pay | Admitting: Gastroenterology

## 2014-07-18 DIAGNOSIS — K50119 Crohn's disease of large intestine with unspecified complications: Secondary | ICD-10-CM

## 2014-07-19 ENCOUNTER — Other Ambulatory Visit: Payer: Self-pay | Admitting: Internal Medicine

## 2014-07-19 DIAGNOSIS — R339 Retention of urine, unspecified: Secondary | ICD-10-CM

## 2014-07-20 ENCOUNTER — Telehealth: Payer: Self-pay | Admitting: Gastroenterology

## 2014-07-20 NOTE — Telephone Encounter (Signed)
Spoke with [name] [name] and gave her instructions as per colonoscopy report. Told her path has not been reviewed by MD yet.

## 2014-07-21 NOTE — Telephone Encounter (Signed)
Thank you. Quant gold and also need to check Hep B Ab, Hep B Antigen.  thanks

## 2014-07-21 NOTE — Telephone Encounter (Signed)
Dewanda Fennema, I spoke with him about biopsies. His crhon's is not under good control We had already discussed remicade and I would like to start him on that medicine now. 26m/kg dose, usually 0, 2, 6 week induction followed by every 8 weeks after that. rov with me in 2 months.

## 2014-07-21 NOTE — Telephone Encounter (Signed)
The pt has been called and notified to have labs done and following the results pt will be set up for remicade.

## 2014-07-21 NOTE — Telephone Encounter (Signed)
Dr Ardis Hughs do you want to get a quant gold on him or do a PPD?

## 2014-07-22 ENCOUNTER — Ambulatory Visit
Admission: RE | Admit: 2014-07-22 | Discharge: 2014-07-22 | Disposition: A | Discharge status: Home / Self Care | Payer: Medicare Other | Source: Ambulatory Visit | Attending: Internal Medicine | Admitting: Internal Medicine

## 2014-07-22 ENCOUNTER — Other Ambulatory Visit: Payer: Medicare Other

## 2014-07-22 ENCOUNTER — Other Ambulatory Visit: Payer: Self-pay | Admitting: Internal Medicine

## 2014-07-22 DIAGNOSIS — R339 Retention of urine, unspecified: Secondary | ICD-10-CM

## 2014-07-22 DIAGNOSIS — K50119 Crohn's disease of large intestine with unspecified complications: Secondary | ICD-10-CM

## 2014-07-23 LAB — HEPATITIS B CORE ANTIBODY, IGM: Hep B C IgM: NONREACTIVE

## 2014-07-23 LAB — HEPATITIS B SURFACE ANTIGEN: Hepatitis B Surface Ag: NEGATIVE

## 2014-07-25 NOTE — Telephone Encounter (Signed)
Waiting for results, preliminary report available today.

## 2014-07-26 LAB — QUANTIFERON TB GOLD ASSAY (BLOOD)
Interferon Gamma Release Assay: NEGATIVE
Mitogen value: >10 IU/mL
Quantiferon Nil Value: 0.09 IU/mL
Quantiferon Tb Ag Minus Nil Value: 0 IU/mL
TB Ag value: 0.05 IU/mL

## 2014-07-29 ENCOUNTER — Other Ambulatory Visit (HOSPITAL_COMMUNITY): Payer: Self-pay | Admitting: Internal Medicine

## 2014-07-29 ENCOUNTER — Telehealth: Payer: Self-pay | Admitting: Gastroenterology

## 2014-07-29 NOTE — Telephone Encounter (Signed)
Referral has been sent to [facility] for Remicade.  I have notified the pt and his wife to expect a call from [facility] or myself next week to set up an appt for Remicade.   I did explain that the infusion will be at [facility] and that would be decided after the approval process.  The pt's wife thanked me for calling

## 2014-07-29 NOTE — Telephone Encounter (Signed)
[name] [name], please call him. Hep B and TB testing both negative. Safe to start remicade as I had previously described (47m/kg at 0, 2, 6 weeks then every 8 weeks). Thanks

## 2014-08-03 ENCOUNTER — Ambulatory Visit: Payer: Medicare Other | Admitting: Gastroenterology

## 2014-08-05 ENCOUNTER — Telehealth: Payer: Self-pay

## 2014-08-05 NOTE — Telephone Encounter (Signed)
-----   Message from Ronald sent at 07/29/2014 10:47 AM EDT ----- Confirm [facility] has received Remicade info and get pt scheduled

## 2014-08-05 NOTE — Telephone Encounter (Addendum)
[facility] called and states that the copay is $2300 if done at [facility] or [facility].  She did advise me that I can try and find a facility that bills medically and that would make it more cost effective.  I tried Home infusion and they state it is just as expensive and I called [facility] and they will set up an appt with the pt to discuss with the MD the remicade infusion and will at that time take care of the insurance.  I called [facility] at [facility] and gave her an update on the situation and she states if anything is needed from them to just call.  Her number is [phone].  I have faxed records to [facility] for an appt to [facility] at [phone]  Message left for the pt to call regarding this information.

## 2014-08-05 NOTE — Telephone Encounter (Addendum)
Pt's wife returned call and was given the information and was told if she does not here from [facility] next week to return call.  Pt agreed

## 2014-08-09 ENCOUNTER — Other Ambulatory Visit: Payer: Self-pay

## 2014-08-09 DIAGNOSIS — K50919 Crohn's disease, unspecified, with unspecified complications: Secondary | ICD-10-CM

## 2014-08-09 MED ORDER — SODIUM CHLORIDE 0.9 % IV SOLN: 5.0000 mg/kg | Freq: Once | INTRAVENOUS | Status: DC

## 2014-08-09 MED ORDER — ACETAMINOPHEN 325 MG PO TABS: 650.0000 mg | ORAL_TABLET | Freq: Once | ORAL | Status: DC

## 2014-08-09 MED ORDER — DIPHENHYDRAMINE HCL 50 MG PO CAPS: 50.0000 mg | ORAL_CAPSULE | Freq: Once | ORAL | Status: DC

## 2014-08-09 NOTE — Telephone Encounter (Signed)
Order for Remicade entered in [facility] and written order faxed to [facility] [phone]. Confirmed orders were received.

## 2014-08-09 NOTE — Telephone Encounter (Signed)
Dr Virgina Jock called and spoke with myself regarding the pt condition.  He says the pt is in his office now and very sick and states the family is willing to pay the $2300 dollars for a one time infusion at [facility].  [facility] will set delivery up with [facility] and have the pt set up appt.  I have called [facility] and notified that the pt will be set up.

## 2014-08-09 NOTE — Telephone Encounter (Signed)
Pt's wife returned call and has not heard anything from [facility] as of today.  States the pt is very sick with numerous bowel movements daily and is weak.  I advised that he may need to be seen at the ER due to dehydration.  Pt wife agreed and states she will take him to the ER and also will call [facility] medical and see if an appt has been made.  In the previous notes [facility] was ntoed but should say [facility] at [phone]. I have placed a call to that office to inquire about appt. I had to leave a voice mail message with [facility] to return call.  I have also corrected the previous notes to state [facility]

## 2014-08-10 ENCOUNTER — Other Ambulatory Visit: Payer: Self-pay

## 2014-08-10 DIAGNOSIS — E860 Dehydration: Secondary | ICD-10-CM

## 2014-08-10 NOTE — Telephone Encounter (Signed)
Pt is sch'd at [facility] for Friday 08-12-14

## 2014-08-10 NOTE — Telephone Encounter (Signed)
I spoke with Laverne and she states I can put that order in [facility] and give her a call back and she will make sure she can see it.

## 2014-08-10 NOTE — Telephone Encounter (Signed)
I have been in the process of trying to get him set up for Remicade but the cost for the pt is $2300.  [facility] can bill medically and the copay is cheaper and I have been trying to get him set up there.  In the mean time the pt was seen at Dr [facility] yesterday for up to 10 diarrhea stools daily for the past week.  Dr Virgina Jock called and stated that the pt is willing to pay the $2300 for 1 remicade infusion until [facility] can get him scheduled.  [facility] has been notified that the remicade is being shipped and the pt is calling to set up an appt.  Dr Virgina Jock asked if the pt could get a liter of fluids while getting the remicade.  Will you order fluids to be given?

## 2014-08-10 NOTE — Telephone Encounter (Signed)
[name] [name] was called and notified that the order was added to [facility] for 1000 ml NS over 5 hours during remicade infusion.  [name] [name] did confirm she saw the order and nothing further needs to be done.  I will continue to work with [facility] to set pt up for future remicade treatments

## 2014-08-10 NOTE — Telephone Encounter (Signed)
Yes, that is a good idea. Please order  1 liter normal saline over 4-5 hours while getting remicade.  thanks

## 2014-08-11 ENCOUNTER — Other Ambulatory Visit (HOSPITAL_COMMUNITY): Payer: Self-pay | Admitting: *Deleted

## 2014-08-12 ENCOUNTER — Encounter (HOSPITAL_COMMUNITY): Payer: Medicare Other

## 2014-08-12 ENCOUNTER — Ambulatory Visit (HOSPITAL_COMMUNITY)
Admission: RE | Admit: 2014-08-12 | Discharge: 2014-08-12 | Disposition: A | Discharge status: Home / Self Care | Payer: Medicare Other | Source: Ambulatory Visit | Attending: Gastroenterology | Admitting: Gastroenterology

## 2014-08-12 VITALS — BP 122/62 | HR 83 | Temp 98.1°F | Resp 18 | Ht 70.0 in | Wt 180.0 lb

## 2014-08-12 DIAGNOSIS — K5090 Crohn's disease, unspecified, without complications: Secondary | ICD-10-CM | POA: Insufficient documentation

## 2014-08-12 DIAGNOSIS — E860 Dehydration: Secondary | ICD-10-CM

## 2014-08-12 MED ORDER — ACETAMINOPHEN 325 MG PO TABS
ORAL_TABLET | ORAL | Status: AC
  Administered 2014-08-12: 650 mg via ORAL
  Filled 2014-08-12: qty 2

## 2014-08-12 MED ORDER — ACETAMINOPHEN 325 MG PO TABS
650.0000 mg | ORAL_TABLET | Freq: Once | ORAL | Status: AC
  Administered 2014-08-12: 650 mg via ORAL

## 2014-08-12 MED ORDER — SODIUM CHLORIDE 0.9 % IV SOLN
5.0000 mg/kg | Freq: Once | INTRAVENOUS | Status: AC
  Administered 2014-08-12: 400 mg via INTRAVENOUS
  Filled 2014-08-12: qty 40

## 2014-08-12 MED ORDER — DIPHENHYDRAMINE HCL 25 MG PO CAPS
ORAL_CAPSULE | ORAL | Status: AC
  Administered 2014-08-12: 50 mg via ORAL
  Filled 2014-08-12: qty 2

## 2014-08-12 MED ORDER — SODIUM CHLORIDE 0.9 % IV SOLN
INTRAVENOUS | Status: AC
  Administered 2014-08-12: 09:00:00 via INTRAVENOUS

## 2014-08-12 MED ORDER — DIPHENHYDRAMINE HCL 25 MG PO TABS
50.0000 mg | ORAL_TABLET | Freq: Once | ORAL | Status: AC
  Administered 2014-08-12: 50 mg via ORAL
  Filled 2014-08-12: qty 2

## 2014-08-15 ENCOUNTER — Telehealth: Payer: Self-pay

## 2014-08-15 NOTE — Telephone Encounter (Signed)
Left message on machine to call back on home phone and cell phone

## 2014-08-15 NOTE — Telephone Encounter (Signed)
I spoke with the pt's wife and advised her that I have called and left a message at [facility] and was waiting on a return call about his appt.  She will call our office the end of the week if she does not hear from that office.

## 2014-08-15 NOTE — Telephone Encounter (Signed)
Call placed to [facility] to "[facility]" to follow up on appt for the pt to discuss remicade infusions.  Message was left at [phone]

## 2014-08-17 ENCOUNTER — Ambulatory Visit (INDEPENDENT_AMBULATORY_CARE_PROVIDER_SITE_OTHER): Payer: Medicare Other | Admitting: Neurology

## 2014-08-17 ENCOUNTER — Encounter: Payer: Self-pay | Admitting: Neurology

## 2014-08-17 VITALS — BP 148/81 | HR 94 | Ht 70.0 in | Wt 174.6 lb

## 2014-08-17 DIAGNOSIS — K5669 Other intestinal obstruction: Secondary | ICD-10-CM

## 2014-08-17 DIAGNOSIS — G20 Parkinson's disease: Secondary | ICD-10-CM

## 2014-08-17 DIAGNOSIS — R269 Unspecified abnormalities of gait and mobility: Secondary | ICD-10-CM | POA: Diagnosis not present

## 2014-08-17 DIAGNOSIS — K56609 Unspecified intestinal obstruction, unspecified as to partial versus complete obstruction: Secondary | ICD-10-CM

## 2014-08-17 MED ORDER — CARBIDOPA-LEVODOPA 25-100 MG PO TABS: ORAL_TABLET | ORAL | Status: DC

## 2014-08-17 NOTE — Patient Instructions (Signed)

## 2014-08-17 NOTE — Progress Notes (Signed)
Reason for visit: Parkinson's disease  [name] [name] is an 81 y.o. male  History of present illness:  [name] [name] is an 81 year old right-handed white male with a history of Parkinson's disease. The patient has had some significant worsening of his Crohn's disease, and he has recently been placed on Remicade for this. He is still having some issues. He has had some problems with dehydration, and he has had cramping mainly in his hands that has occurred over the last several weeks. The patient has had a decline in his overall functional level with his Parkinson's disease. His movements are slowing down, his ability to walk has worsened, he has fallen on occasion. He is having some increasing problems with memory, and concentration. The patient has hearing aids in, and at times he does not hear conversations well. He is sleeping well at night, but he is also drowsy during the day. He does not snore at night, and he does not act out his dreams. The patient inquires about the use of medical marijuana today. He returns for an evaluation.  Past Medical History  Diagnosis Date  . Hypertension   . Hyperlipidemia   . Coronary artery disease     s/p ANTERIOR STEMI 1/09 at [facility]; treated with a bare-metal stent to the LAD; Myoview 11/11 EF 71%, no scar, no ischemia;     cath 12/20/10:   pLAD 30-40%, stent ok, ? If stent oversized, EF 60% and low LVEDP  . GERD (gastroesophageal reflux disease)   . Other esophagitis   . Diaphragmatic hernia without mention of obstruction or gangrene   . Ulcerative (chronic) ileocolitis   . Diverticulosis of colon (without mention of hemorrhage)   . Other chronic nonalcoholic liver disease   . Regional enteritis of small intestine   . Flatulence, eructation, and gas pain   . Ischemic cardiomyopathy     EF initially 35-40% after MI 1/09; echo 7/08: EF 60%  . Fatty liver     on ultrasound of 10/2009  . Parkinson's disease   . Gait disorder   . Memory disorder   .  Osteoporosis   . Renal calculi   . Melanoma   . RBBB 09/02/2013  . HOH (hard of hearing)     Hearing aids    Past Surgical History  Procedure Laterality Date  . Cardiac catheterization  09/25/06    EF 35-40% but more recently 60%  . Cholecystectomy    . Appendectomy    . Back surgery      L3, L4, L5  . Tonsillectomy    . Partial bowel resection    . Coronary artery stent placement    . Melanoma resection    . Cataract extraction      Bilateral    Family History  Problem Relation Age of Onset  . Coronary artery disease    . Heart failure Mother   . Emphysema Father   . Heart disease Brother   . Heart disease Brother   . Colon cancer Brother     Social history:  reports that he has never smoked. He has never used smokeless tobacco. He reports that he does not drink alcohol or use illicit drugs.   No Known Allergies  Medications:  Prior to Admission medications   Medication Sig Start Date End Date Taking? Authorizing Provider  acetaminophen (TYLENOL) 325 MG tablet Take 650 mg by mouth every 6 (six) hours as needed for pain.   Yes Historical Provider, MD  ADVAIR DISKUS 250-50 MCG/DOSE AEPB  01/24/14  Yes Historical Provider, MD  ANDROGEL PUMP 20.25 MG/ACT (1.62%) GEL  03/21/14  Yes Historical Provider, MD  aspirin EC 81 MG tablet Take 81 mg by mouth daily.   Yes Historical Provider, MD  atorvastatin (LIPITOR) 40 MG tablet Take 40 mg by mouth daily.     Yes Historical Provider, MD  Budesonide 9 MG TB24 Take 1 tablet by mouth daily.   Yes Historical Provider, MD  calcium carbonate (CALCIUM 600) 600 MG TABS tablet Take 600 mg by mouth daily with breakfast.   Yes Historical Provider, MD  carbidopa-levodopa (SINEMET IR) 25-100 MG per tablet 1.5 tablets in the morning and evening, one tablet at midday 04/04/14  Yes [name], MD  carvedilol (COREG) 6.25 MG tablet Take 6.25 mg by mouth 2 (two) times daily with a meal.   Yes Historical Provider, MD  cholestyramine  (QUESTRAN) 4 G packet TAKE 1 PACKET BY MOUTH TWICE DAILY WITH A MEAL 11/15/13  Yes [name], MD  isosorbide mononitrate (IMDUR) 30 MG 24 hr tablet TAKE ONE TABLET EACH DAY 07/29/14  Yes Jolaine Artist, MD  lansoprazole (PREVACID) 30 MG capsule Take 30 mg by mouth daily.   Yes Historical Provider, MD  losartan (COZAAR) 25 MG tablet Take 25 mg by mouth daily. 12/06/13  Yes Historical Provider, MD  mesalamine (PENTASA) 250 MG CR capsule TAKE TWO CAPSULES THREE TIMES DAILY 11/15/13  Yes [name], MD  Multiple Vitamins-Minerals (CENTRUM SILVER PO) Take 1 tablet by mouth daily.   Yes Historical Provider, MD  nitroGLYCERIN (NITROSTAT) 0.4 MG SL tablet Place 0.4 mg under the tongue every 5 (five) minutes as needed for chest pain.   Yes Historical Provider, MD  Potassium Bicarbonate 99 MG CAPS Take 1 capsule by mouth daily.   Yes Historical Provider, MD  predniSONE (DELTASONE) 10 MG tablet TAKE TWO TABLETS TWICE DAILY 07/15/14  Yes [name], MD  REMICADE 100 MG injection Inject 100 mg into the vein. Patient is not sure of frequency yet 08/09/14  Yes Historical Provider, MD  tamsulosin (FLOMAX) 0.4 MG CAPS capsule Take 0.4 mg by mouth daily. 07/25/14  Yes Historical Provider, MD  Testosterone (ANDROGEL PUMP TD) Place 2 application onto the skin daily. 2 pumps once daily    Yes Historical Provider, MD  vitamin B-12 (CYANOCOBALAMIN) 1000 MCG tablet Take 1,000 mcg by mouth daily.   Yes Historical Provider, MD    ROS:  Out of a complete 14 system review of symptoms, the patient complains only of the following symptoms, and all other reviewed systems are negative.  Fatigue Ringing in the ears Eyes discharge Excessive thirst Diarrhea Muscle cramps Skin wounds Bruising easily Memory loss, dizziness  Blood pressure 148/81, pulse 94, height 5' 10"  (1.778 m), weight 174 lb 9.6 oz (79.198 kg).  Physical Exam  General: The patient is alert and cooperative at the time of the  examination.  Skin: No significant peripheral edema is noted.   Neurologic Exam  Mental status: The patient is alert and oriented x 3 at the time of the examination. The patient has apparent normal recent and remote memory, with an apparently normal attention span and concentration ability. Mini-Mental Status Examination done today shows a total score 28/30.   Cranial nerves: Facial symmetry is not present. There is relative ptosis of the left eye. Speech is normal, no aphasia or dysarthria is noted. Extraocular movements are full. Visual fields are full. Masking of  the face is noted.  Motor: The patient has good strength in all 4 extremities.  Sensory examination: Soft touch sensation is symmetric on the face, arms, and legs.  Coordination: The patient has good finger-nose-finger and heel-to-shin bilaterally.  Gait and station: The patient has difficulty arising from a seated position with arms crossed. Once up, the patient is able to walk independently, he normally uses a cane. There is decreased arm swing bilaterally, left greater than right. The patient short shuffling steps, and slowness with turns. Tandem gait was not attempted. Romberg is negative.  Reflexes: Deep tendon reflexes are symmetric.   Assessment/Plan:  1. Parkinson's disease  2. Memory disturbance  3. Gait disturbance  4. Crohn's disease  5. Muscle cramps, hands  The patient has had worsening of his cognitive functioning and physical functioning around the time of worsening of his Crohn's disease. There is a possibility that his Sinemet may not be well absorbed during the Crohn's disease flare. The patient will be increased on the medication taking 1.5 Sinemet tablets of the 25/100 mg tablets in the morning and evening, one full tablet at noon and at dinnertime. The patient will follow-up in 4 months. We will get him set up for physical therapy to be done at [facility]. He will contact me if he has any concerns.  I am not aware that medical marijuana has any definite benefit with use with Parkinson's disease, certainly not in an individual with cognitive dysfunction.  Jill Alexanders MD 08/17/2014 9:03 AM  [facility] [address] [place], [place] [address]  Phone [phone] Fax [phone]

## 2014-08-18 NOTE — Telephone Encounter (Signed)
I called and spoke with Raysal today and finally spoke with Otila Kluver who informed me that the fax number given to me by [facility] was incorrect.  I was given another fax number and faxed thru [facility] and hand faxed all records again to the new number attn to [place] .

## 2014-08-18 NOTE — Telephone Encounter (Signed)
  Call was placed to [facility] to confirm receipt of records for appt message was left

## 2014-08-18 NOTE — Telephone Encounter (Signed)
[name] [name] called and has received the info and it is on the Dr's desk to be reviewed.  After reviewing [name] [name] will get the info and call insurance and set that up and call with details.

## 2014-08-22 ENCOUNTER — Telehealth: Payer: Self-pay | Admitting: Gastroenterology

## 2014-08-22 NOTE — Telephone Encounter (Signed)
Call placed to [facility] at phone number [phone].  I was again told that the MD is reviewing the information.    FYI:  Dr Ardis Hughs Dr Marijean Bravo or Dr Trudie Reed may be paging you to discuss the pt.  I can not get anywhere with the office staff and requested to speak with the MD.

## 2014-08-22 NOTE — Telephone Encounter (Signed)
Called, no answer. LM on VM for them to call back.

## 2014-08-22 NOTE — Telephone Encounter (Signed)
We discussed his current meds. She will make sure he takes 13m bid prednisone, imodium one pill twice daily, cholestyramine twice daily  Demoni Gergen, can you make sure he is getting remicade #2 in a week from now. Thanks             DMilus Banister MD at 08/22/2014 10:32 AM

## 2014-08-22 NOTE — Telephone Encounter (Signed)
We discussed his current meds.  She will make sure he takes 10m bid prednisone, imodium one pill twice daily, cholestyramine twice daily  Patty, can you make sure he is getting remicade #2 in a week from now.  Thanks

## 2014-08-22 NOTE — Telephone Encounter (Signed)
The pt is having diarrhea 5-6 times daily.  He had remicade 1 week ago.  We are trying to get him set up for future remicade infusions at [facility].  He has had a lot of issues with insurance (see previous notes) His wife would like to know what he can do in the mean time.

## 2014-08-22 NOTE — Telephone Encounter (Signed)
See alternate note

## 2014-08-23 ENCOUNTER — Other Ambulatory Visit (HOSPITAL_COMMUNITY): Payer: Self-pay | Admitting: Internal Medicine

## 2014-08-23 NOTE — Telephone Encounter (Signed)
i spoke with Dr. Amil Amen, he will be getting all this straightened out.

## 2014-08-24 ENCOUNTER — Telehealth: Payer: Self-pay | Admitting: Neurology

## 2014-08-24 NOTE — Telephone Encounter (Signed)
I called the patient's wife. She wondered if she needed to do anything to set up the therapy or if we did that. I told her that Dr. Jannifer Franklin had put the order in and it looked like it had been sent to [facility]. I explained that I would forward this message to our referral coordinator who could give her a better explanation of where we are in this process.

## 2014-08-24 NOTE — Telephone Encounter (Signed)
Spoke with wife and informed her that the order was sent over. She verbalized understanding

## 2014-08-24 NOTE — Progress Notes (Signed)
[name], male   DOB: 1928-10-07, 81 y.o.   MRN: [id number] Appt has been made for the pt at [facility]  With Dr Amil Amen on 08/31/14 at 8 am.  The pt has been notified and is aware to bring copay and meds

## 2014-08-24 NOTE — Telephone Encounter (Signed)
Patients wife called and requested to speak with someone regarding the referral to [facility] discussed during the last visit. Please call and advise.

## 2014-08-29 ENCOUNTER — Telehealth: Payer: Self-pay | Admitting: Gastroenterology

## 2014-08-29 NOTE — Telephone Encounter (Signed)
Pt aware and has been scheduled for an ROV with Dr Ardis Hughs

## 2014-08-29 NOTE — Telephone Encounter (Signed)
Dr Ardis Hughs please advise

## 2014-08-29 NOTE — Telephone Encounter (Signed)
Left message on machine to call back  

## 2014-08-29 NOTE — Telephone Encounter (Signed)
Yes, for now.  The goal will be to taper him off prednisone as the remicade starts to have effect.    Needs rov with me in 4-6 weeks, double book if needed

## 2014-09-05 ENCOUNTER — Emergency Department (HOSPITAL_COMMUNITY): Payer: Medicare Other

## 2014-09-05 ENCOUNTER — Emergency Department (HOSPITAL_BASED_OUTPATIENT_CLINIC_OR_DEPARTMENT_OTHER): Admit: 2014-09-05 | Discharge: 2014-09-05 | Disposition: A | Discharge status: Home / Self Care | Payer: Medicare Other

## 2014-09-05 ENCOUNTER — Encounter (HOSPITAL_COMMUNITY): Payer: Self-pay | Admitting: Emergency Medicine

## 2014-09-05 ENCOUNTER — Emergency Department (HOSPITAL_COMMUNITY)
Admission: EM | Admit: 2014-09-05 | Discharge: 2014-09-05 | Disposition: A | Discharge status: Home / Self Care | Payer: Medicare Other | Attending: Emergency Medicine | Admitting: Emergency Medicine

## 2014-09-05 DIAGNOSIS — Z79899 Other long term (current) drug therapy: Secondary | ICD-10-CM | POA: Insufficient documentation

## 2014-09-05 DIAGNOSIS — Z7982 Long term (current) use of aspirin: Secondary | ICD-10-CM | POA: Insufficient documentation

## 2014-09-05 DIAGNOSIS — I2510 Atherosclerotic heart disease of native coronary artery without angina pectoris: Secondary | ICD-10-CM | POA: Insufficient documentation

## 2014-09-05 DIAGNOSIS — Z87448 Personal history of other diseases of urinary system: Secondary | ICD-10-CM | POA: Insufficient documentation

## 2014-09-05 DIAGNOSIS — Z87442 Personal history of urinary calculi: Secondary | ICD-10-CM | POA: Insufficient documentation

## 2014-09-05 DIAGNOSIS — L03113 Cellulitis of right upper limb: Secondary | ICD-10-CM | POA: Diagnosis not present

## 2014-09-05 DIAGNOSIS — R609 Edema, unspecified: Secondary | ICD-10-CM

## 2014-09-05 DIAGNOSIS — H9190 Unspecified hearing loss, unspecified ear: Secondary | ICD-10-CM | POA: Insufficient documentation

## 2014-09-05 DIAGNOSIS — Z7952 Long term (current) use of systemic steroids: Secondary | ICD-10-CM | POA: Diagnosis not present

## 2014-09-05 DIAGNOSIS — I10 Essential (primary) hypertension: Secondary | ICD-10-CM | POA: Insufficient documentation

## 2014-09-05 DIAGNOSIS — K219 Gastro-esophageal reflux disease without esophagitis: Secondary | ICD-10-CM | POA: Diagnosis not present

## 2014-09-05 DIAGNOSIS — G20 Parkinson's disease: Secondary | ICD-10-CM | POA: Insufficient documentation

## 2014-09-05 DIAGNOSIS — Z8739 Personal history of other diseases of the musculoskeletal system and connective tissue: Secondary | ICD-10-CM | POA: Insufficient documentation

## 2014-09-05 DIAGNOSIS — M79601 Pain in right arm: Secondary | ICD-10-CM | POA: Diagnosis present

## 2014-09-05 DIAGNOSIS — Z85820 Personal history of malignant melanoma of skin: Secondary | ICD-10-CM | POA: Diagnosis not present

## 2014-09-05 LAB — CBC WITH DIFFERENTIAL/PLATELET
Basophils Absolute: 0 10*3/uL (ref 0.0–0.1)
Basophils Relative: 0 % (ref 0–1)
Eosinophils Absolute: 0 10*3/uL (ref 0.0–0.7)
Eosinophils Relative: 1 % (ref 0–5)
HCT: 48.2 % (ref 39.0–52.0)
Hemoglobin: 16 g/dL (ref 13.0–17.0)
Lymphocytes Relative: 15 % (ref 12–46)
Lymphs Abs: 1.2 10*3/uL (ref 0.7–4.0)
MCH: 32.7 pg (ref 26.0–34.0)
MCHC: 33.2 g/dL (ref 30.0–36.0)
MCV: 98.6 fL (ref 78.0–100.0)
Monocytes Absolute: 0.5 10*3/uL (ref 0.1–1.0)
Monocytes Relative: 6 % (ref 3–12)
Neutro Abs: 6.4 10*3/uL (ref 1.7–7.7)
Neutrophils Relative %: 79 % — ABNORMAL HIGH (ref 43–77)
Platelets: 104 10*3/uL — ABNORMAL LOW (ref 150–400)
RBC: 4.89 MIL/uL (ref 4.22–5.81)
RDW: 15 % (ref 11.5–15.5)
WBC: 8.2 10*3/uL (ref 4.0–10.5)

## 2014-09-05 LAB — URINALYSIS, ROUTINE W REFLEX MICROSCOPIC
Bilirubin Urine: NEGATIVE
Glucose, UA: NEGATIVE mg/dL
Hgb urine dipstick: NEGATIVE
Ketones, ur: NEGATIVE mg/dL
Leukocytes, UA: NEGATIVE
Nitrite: NEGATIVE
Protein, ur: NEGATIVE mg/dL
Specific Gravity, Urine: 1.012 (ref 1.005–1.030)
Urobilinogen, UA: 0.2 mg/dL (ref 0.0–1.0)
pH: 5 (ref 5.0–8.0)

## 2014-09-05 LAB — COMPREHENSIVE METABOLIC PANEL
ALT: 14 U/L — ABNORMAL LOW (ref 17–63)
AST: 31 U/L (ref 15–41)
Albumin: 3.5 g/dL (ref 3.5–5.0)
Alkaline Phosphatase: 43 U/L (ref 38–126)
Anion gap: 10 (ref 5–15)
BUN: 19 mg/dL (ref 6–20)
CO2: 24 mmol/L (ref 22–32)
Calcium: 8.6 mg/dL — ABNORMAL LOW (ref 8.9–10.3)
Chloride: 102 mmol/L (ref 101–111)
Creatinine, Ser: 0.86 mg/dL (ref 0.61–1.24)
GFR calc Af Amer: >60 mL/min (ref >60)
GFR calc non Af Amer: >60 mL/min (ref >60)
Glucose, Bld: 143 mg/dL — ABNORMAL HIGH (ref 65–99)
Potassium: 3.9 mmol/L (ref 3.5–5.1)
Sodium: 136 mmol/L (ref 135–145)
Total Bilirubin: 2.7 mg/dL — ABNORMAL HIGH (ref 0.3–1.2)
Total Protein: 6.3 g/dL — ABNORMAL LOW (ref 6.5–8.1)

## 2014-09-05 LAB — BRAIN NATRIURETIC PEPTIDE: B Natriuretic Peptide: 38.8 pg/mL (ref 0.0–100.0)

## 2014-09-05 LAB — I-STAT TROPONIN, ED: Troponin i, poc: 0.02 ng/mL (ref 0.00–0.08)

## 2014-09-05 MED ORDER — SULFAMETHOXAZOLE-TRIMETHOPRIM 800-160 MG PO TABS
1.0000 | ORAL_TABLET | Freq: Three times a day (TID) | ORAL | Status: DC
Start: 2014-09-05 — End: 2014-09-22

## 2014-09-05 MED ORDER — IOHEXOL 350 MG/ML SOLN
100.0000 mL | Freq: Once | INTRAVENOUS | Status: AC | PRN
  Administered 2014-09-05: 100 mL via INTRAVENOUS

## 2014-09-05 MED ORDER — LORAZEPAM 1 MG PO TABS
1.0000 mg | ORAL_TABLET | Freq: Once | ORAL | Status: AC
  Administered 2014-09-05: 1 mg via ORAL
  Filled 2014-09-05: qty 1

## 2014-09-05 NOTE — Progress Notes (Signed)
VASCULAR LAB PRELIMINARY  PRELIMINARY  PRELIMINARY  PRELIMINARY  Right upper extremity venous Doppler completed.    Preliminary report:  There is no DVT or SVT noted in the right upper extremity.  Dylan Monforte, RVT 09/05/2014, 1:42 PM

## 2014-09-05 NOTE — ED Notes (Signed)
Patient transported to X-ray 

## 2014-09-05 NOTE — ED Notes (Signed)
Waiting on vascular tech

## 2014-09-05 NOTE — ED Notes (Signed)
Pt ambulating w/ assistance upon discharge, A&Ox4. D/c instructions reviewed w/ pt and family - pt and family deny any further questions or concerns at present. Rx given x1

## 2014-09-05 NOTE — ED Provider Notes (Signed)
  Physical Exam  BP 146/84 mmHg  Pulse 94  Temp(Src) 97.9 F (36.6 C) (Oral)  Resp 18  SpO2 95%  Physical Exam  ED Course  Procedures  MDM Reviewed CT scan results with patient. Will discharge home and will treat with antibiotics       Davonna Belling, MD 09/05/14 1924

## 2014-09-05 NOTE — ED Notes (Signed)
Patient here from [facility] with complaint of right arm pain. Possible cellulitis. SOB x2days. Lungs clear. 2L O2 improvement. ST 104.

## 2014-09-05 NOTE — ED Notes (Signed)
MD at bedside. 

## 2014-09-05 NOTE — Progress Notes (Signed)
CSW met with pt at bedside. Wife and daughter were present. Patient confirms that he comes from [facility]. Wife states that she and the pt live in the independent living unit.   Patient confirms that he presents to [facility] due to R arm pain. He states that his arm has been in pain for a couple of weeks.Daughter states that the pt's heart rate was racing and he had SOB this morning.  Wife informed CSW that the pt has parkinsons disease. She also informed CSW that the pt has fallen x2 in the past 6 months.   Patient states that he completes his ADL's independently.  Patient and family state that they do not have any questions at this time.  Wife/Nancy Aung [phone] Daughter/Anne [phone]  Willette Brace [phone] ED CSW 09/05/2014 5:05 PM

## 2014-09-05 NOTE — Discharge Instructions (Signed)

## 2014-09-05 NOTE — Progress Notes (Signed)
Pt discussed with ED SW Pt from [place] with ?cellulitis  US showed no RUE DVT

## 2014-09-05 NOTE — ED Provider Notes (Signed)
CSN: [id number]     Arrival date & time 09/05/14  1050 History   First MD Initiated Contact with Patient 09/05/14 1056     Chief Complaint  Patient presents with  . Arm Pain     (Consider location/radiation/quality/duration/timing/severity/associated sxs/prior Treatment) Patient is a 81 y.o. male presenting with shortness of breath.  Shortness of Breath Severity:  Moderate Onset quality:  Gradual Duration:  2 days Timing:  Constant Progression:  Worsening Chronicity:  New Context comment:  RUE swelling, tachycardia Relieved by:  Nothing Worsened by:  Nothing tried Ineffective treatments:  None tried Associated symptoms: cough   Associated symptoms: no abdominal pain, no fever, no headaches and no vomiting     Past Medical History  Diagnosis Date  . Hypertension   . Hyperlipidemia   . Coronary artery disease     s/p ANTERIOR STEMI 1/09 at [place]; treated with a bare-metal stent to the LAD; Myoview 11/11 EF 71%, no scar, no ischemia;     cath 12/20/10:   pLAD 30-40%, stent ok, ? If stent oversized, EF 60% and low LVEDP  . GERD (gastroesophageal reflux disease)   . Other esophagitis   . Diaphragmatic hernia without mention of obstruction or gangrene   . Ulcerative (chronic) ileocolitis   . Diverticulosis of colon (without mention of hemorrhage)   . Other chronic nonalcoholic liver disease   . Regional enteritis of small intestine   . Flatulence, eructation, and gas pain   . Ischemic cardiomyopathy     EF initially 35-40% after MI 1/09; echo 7/08: EF 60%  . Fatty liver     on ultrasound of 10/2009  . Parkinson's disease   . Gait disorder   . Memory disorder   . Osteoporosis   . Renal calculi   . Melanoma   . RBBB 09/02/2013  . HOH (hard of hearing)     Hearing aids   Past Surgical History  Procedure Laterality Date  . Cardiac catheterization  09/25/06    EF 35-40% but more recently 60%  . Cholecystectomy    . Appendectomy    . Back surgery      L3, L4, L5  .  Tonsillectomy    . Partial bowel resection    . Coronary artery stent placement    . Melanoma resection    . Cataract extraction      Bilateral   Family History  Problem Relation Age of Onset  . Coronary artery disease    . Heart failure Mother   . Emphysema Father   . Heart disease Brother   . Heart disease Brother   . Colon cancer Brother    History  Substance Use Topics  . Smoking status: Never Smoker   . Smokeless tobacco: Never Used  . Alcohol Use: No     Comment: occ.    Review of Systems  Constitutional: Negative for fever.  Respiratory: Positive for cough and shortness of breath.   Gastrointestinal: Negative for vomiting and abdominal pain.  Neurological: Negative for headaches.  All other systems reviewed and are negative.     Allergies  Review of patient's allergies indicates no known allergies.  Home Medications   Prior to Admission medications   Medication Sig Start Date End Date Taking? Authorizing Provider  acetaminophen (TYLENOL) 325 MG tablet Take 650 mg by mouth every 6 (six) hours as needed for pain.   Yes Historical Provider, MD  ADVAIR DISKUS 250-50 MCG/DOSE AEPB Inhale 1 puff into the lungs daily.  01/24/14  Yes Historical Provider, MD  ANDROGEL PUMP 20.25 MG/ACT (1.62%) GEL Apply 1 application topically daily.  03/21/14  Yes Historical Provider, MD  aspirin EC 81 MG tablet Take 81 mg by mouth daily.   Yes Historical Provider, MD  atorvastatin (LIPITOR) 40 MG tablet Take 40 mg by mouth daily.     Yes Historical Provider, MD  Budesonide 9 MG TB24 Take 9 mg by mouth daily.    Yes Historical Provider, MD  calcium carbonate (CALCIUM 600) 600 MG TABS tablet Take 600 mg by mouth daily with breakfast.   Yes Historical Provider, MD  carbidopa-levodopa (SINEMET IR) 25-100 MG per tablet 1.5 tablets in the morning and evening, one tablet at midday and at dinnertime 08/17/14  Yes [name], MD  carvedilol (COREG) 6.25 MG tablet Take 6.25 mg by mouth 2  (two) times daily with a meal.   Yes Historical Provider, MD  cephALEXin (KEFLEX) 500 MG capsule Take 500 mg by mouth 2 (two) times daily.   Yes Historical Provider, MD  Cholecalciferol (VITAMIN D-3) 1000 UNITS CAPS Take 1,000 Units by mouth daily.   Yes Historical Provider, MD  cholestyramine (QUESTRAN) 4 G packet TAKE 1 PACKET BY MOUTH TWICE DAILY WITH A MEAL 11/15/13  Yes [name], MD  isosorbide mononitrate (IMDUR) 30 MG 24 hr tablet TAKE ONE TABLET EACH DAY Patient taking differently: TAKE 30MG BY MOUTH EACH DAY 07/29/14  Yes Jolaine Artist, MD  losartan (COZAAR) 25 MG tablet Take 25 mg by mouth daily. 12/06/13  Yes Historical Provider, MD  mesalamine (PENTASA) 250 MG CR capsule TAKE TWO CAPSULES THREE TIMES DAILY Patient taking differently: Take 500 mg by mouth 3 (three) times daily.  11/15/13  Yes [name], MD  Multiple Vitamins-Minerals (CENTRUM SILVER PO) Take 1 tablet by mouth daily.   Yes Historical Provider, MD  mupirocin ointment (BACTROBAN) 2 % Apply 1 application topically daily as needed (skin irritation).  09/02/14  Yes Historical Provider, MD  Potassium Bicarbonate 99 MG CAPS Take 88 mg by mouth daily.    Yes Historical Provider, MD  predniSONE (DELTASONE) 10 MG tablet TAKE TWO TABLETS TWICE DAILY Patient taking differently: TAKE 80m BY MOUTH TWICE DAILY 07/15/14  Yes DMilus Banister MD  REMICADE 100 MG injection Inject 100 mg into the vein. Patient is not sure of frequency yet 08/09/14  Yes Historical Provider, MD  tamsulosin (FLOMAX) 0.4 MG CAPS capsule Take 0.4 mg by mouth daily. 07/25/14  Yes Historical Provider, MD  NITROSTAT 0.4 MG SL tablet AS DIRECTED Patient not taking: Reported on 09/05/2014 08/23/14   DJolaine Artist MD  sulfamethoxazole-trimethoprim (BACTRIM DS,SEPTRA DS) 800-160 MG per tablet Take 1 tablet by mouth 3 (three) times daily. 09/05/14   NDavonna Belling MD   BP 146/84 mmHg  Pulse 94  Temp(Src) 97.9 F (36.6 C) (Oral)  Resp 18  SpO2  95% Physical Exam  Constitutional: He is oriented to person, place, and time. He appears well-developed and well-nourished.  HENT:  Head: Normocephalic and atraumatic.  Eyes: Conjunctivae and EOM are normal.  Neck: Normal range of motion. Neck supple.  Cardiovascular: Normal rate, regular rhythm and normal heart sounds.   Pulmonary/Chest: Effort normal and breath sounds normal. No respiratory distress.  Abdominal: He exhibits no distension. There is no tenderness. There is no rebound and no guarding.  Musculoskeletal: Normal range of motion.  Mild swelling of entirety of RUE distal>proximal with well demarcated erythema over lateral aspect of forearm  Neurological: He is alert and oriented to person, place, and time.  Skin: Skin is warm and dry.  Vitals reviewed.   ED Course  Procedures (including critical care time) Labs Review Labs Reviewed  CBC WITH DIFFERENTIAL/PLATELET - Abnormal; Notable for the following:    Platelets 104 (*)    Neutrophils Relative % 79 (*)    All other components within normal limits  COMPREHENSIVE METABOLIC PANEL - Abnormal; Notable for the following:    Glucose, Bld 143 (*)    Calcium 8.6 (*)    Total Protein 6.3 (*)    ALT 14 (*)    Total Bilirubin 2.7 (*)    All other components within normal limits  BRAIN NATRIURETIC PEPTIDE  URINALYSIS, ROUTINE W REFLEX MICROSCOPIC (NOT AT [facility])  Randolm Idol, ED    Imaging Review Dg Chest 2 View  09/05/2014   CLINICAL DATA:  Pt takes HTN meds. Not diabetic. Nonsmoker. Pt has a severe hematoma on right forearm. SOB at times. Not acute. No coughing or congestion  EXAM: CHEST  2 VIEW  COMPARISON:  09/28/2012  FINDINGS: Lungs mildly hyperexpanded. Lungs are clear. No pleural effusion or pneumothorax.  Heart, mediastinum and hila are unremarkable.  Bony thorax is intact.  IMPRESSION: No active cardiopulmonary disease.   Electronically Signed   By: Lajean Manes M.D.   On: 09/05/2014 11:53   Ct Head Wo  Contrast  09/05/2014   CLINICAL DATA:  Parkinson's disease, status post fall, slurred speech  EXAM: CT HEAD WITHOUT CONTRAST  TECHNIQUE: Contiguous axial images were obtained from the base of the skull through the vertex without intravenous contrast.  COMPARISON:  12/30/2011  FINDINGS: No evidence of parenchymal hemorrhage or extra-axial fluid collection. No mass lesion, mass effect, or midline shift.  No CT evidence of acute infarction.  Subcortical white matter and periventricular small vessel ischemic changes.  Mild cortical atrophy.  No ventriculomegaly.  The visualized paranasal sinuses are essentially clear. The mastoid air cells are unopacified.  No evidence of calvarial fracture.  IMPRESSION: No evidence of acute intracranial abnormality.  Atrophy with small vessel ischemic changes.   Electronically Signed   By: Julian Hy M.D.   On: 09/05/2014 18:16   Ct Angio Chest Pe W/cm &/or Wo Cm  09/05/2014   CLINICAL DATA:  Right arm pain, shortness of breath, evaluate for PE  EXAM: CT ANGIOGRAPHY CHEST WITH CONTRAST  TECHNIQUE: Multidetector CT imaging of the chest was performed using the standard protocol during bolus administration of intravenous contrast. Multiplanar CT image reconstructions and MIPs were obtained to evaluate the vascular anatomy.  CONTRAST:  118m OMNIPAQUE IOHEXOL 350 MG/ML SOLN  COMPARISON:  Chest radiographs dated 09/05/2014  FINDINGS: No evidence of pulmonary embolism.  Mediastinum/Nodes: Heart is top-normal in size. No pericardial effusion.  Coronary atherosclerosis.  Mild atherosclerotic calcifications of the aortic arch.  Small mediastinal lymph nodes which do not meet pathologic CT size criteria.  Visualized thyroid is unremarkable.  Lungs/Pleura: Patchy subpleural opacity in the lateral left lower lobe (series 8/image 23), favored to reflect atelectasis.  Additional mild dependent atelectasis in the bilateral upper and lower lobes.  No suspicious pulmonary nodules.  Prominent  subpleural fat along the lateral left hemithorax (series 5/ image 45).  No pleural effusion or pneumothorax.  Upper abdomen: Visualized upper abdomen is unremarkable.  Musculoskeletal: Degenerative changes of the visualized thoracolumbar spine.  Review of the MIP images confirms the above findings.  IMPRESSION: No evidence of pulmonary embolism.  Patchy subpleural opacity in  the left lower lobe, favored to reflect atelectasis.  No evidence of acute cardiopulmonary disease.   Electronically Signed   By: Julian Hy M.D.   On: 09/05/2014 18:14     EKG Interpretation   Date/Time:  Monday Sep 05 2014 11:49:21 EDT Ventricular Rate:  97 PR Interval:  203 QRS Duration: 126 QT Interval:  353 QTC Calculation: 448 R Axis:   76 Text Interpretation:  Sinus rhythm Right bundle branch block No  significant change since last tracing Confirmed by Debby Freiberg [id number]  on 09/05/2014 12:01:20 PM      MDM   Final diagnoses:  Cellulitis of forearm, right    81 y.o. male with pertinent PMH of crohns on remicaid with 1 month of RUE rash, seen by dermatology and with no change after abx course presents with dyspnea, cough, malaise x 3 days, with increased RUE swelling x 1 day.  No increase in pain, fevers, or gi symptoms.  On arrival vitals and physical exam as above.  Pt has swelling and a well demarcated erythematous plaque with scaling, chronic appearing to RUE forearm with surrounding swelling.  This is not clearly cellulitic on my exam.    Elba Barman as above obtained, no DVT of UE.  CT PE study obtained due to possibility of passed clot and respiratory symptoms.   Plan to dc home with abx if this is negative.   I have reviewed all laboratory and imaging studies if ordered as above  1. Cellulitis of forearm, right         Debby Freiberg, MD 09/06/14 [phone]

## 2014-09-06 ENCOUNTER — Telehealth: Payer: Self-pay | Admitting: Cardiology

## 2014-09-06 NOTE — Telephone Encounter (Signed)
Returned call to [place] at [employer].She stated she thinks patient needs a sooner appointment with Dr.Jordan.Stated he had to go to ER yesterday with sob,fast heart beat.Stated he is feeling better today.Heart rate in 90's.Stated he wanted to see Dr.Jordan only.Advised Dr.Jordan's schedule is full.Advised I will check for a extender appointment and call you back 09/07/14.

## 2014-09-06 NOTE — Telephone Encounter (Signed)
Mliss Sax called wanting to speak with Dr. Doug Sou nurse about seeing of the pt could be seen sooner than 6/17 because the pt is just getting out of the ER because of SOB and his HR being elevated. Please call  Thanks

## 2014-09-07 ENCOUNTER — Telehealth: Payer: Self-pay | Admitting: Gastroenterology

## 2014-09-07 ENCOUNTER — Non-Acute Institutional Stay (SKILLED_NURSING_FACILITY): Payer: Medicare Other | Admitting: Internal Medicine

## 2014-09-07 ENCOUNTER — Encounter: Payer: Self-pay | Admitting: Internal Medicine

## 2014-09-07 DIAGNOSIS — K50019 Crohn's disease of small intestine with unspecified complications: Secondary | ICD-10-CM

## 2014-09-07 DIAGNOSIS — K760 Fatty (change of) liver, not elsewhere classified: Secondary | ICD-10-CM | POA: Diagnosis not present

## 2014-09-07 DIAGNOSIS — R413 Other amnesia: Secondary | ICD-10-CM

## 2014-09-07 DIAGNOSIS — R531 Weakness: Secondary | ICD-10-CM

## 2014-09-07 DIAGNOSIS — K2210 Ulcer of esophagus without bleeding: Secondary | ICD-10-CM

## 2014-09-07 DIAGNOSIS — R296 Repeated falls: Secondary | ICD-10-CM

## 2014-09-07 DIAGNOSIS — K208 Other esophagitis: Secondary | ICD-10-CM

## 2014-09-07 DIAGNOSIS — G20 Parkinson's disease: Secondary | ICD-10-CM | POA: Diagnosis not present

## 2014-09-07 DIAGNOSIS — G20A1 Parkinson's disease without dyskinesia, without mention of fluctuations: Secondary | ICD-10-CM

## 2014-09-07 DIAGNOSIS — L03113 Cellulitis of right upper limb: Secondary | ICD-10-CM | POA: Diagnosis not present

## 2014-09-07 MED ORDER — PREDNISONE 10 MG PO TABS: 20.0000 mg | ORAL_TABLET | Freq: Two times a day (BID) | ORAL | Status: DC

## 2014-09-07 NOTE — Telephone Encounter (Signed)
Clinic nurse with [facility] was notified and will call with further concerns

## 2014-09-07 NOTE — Progress Notes (Signed)
[name], male   DOB: November 22, 1928, 81 y.o.   MRN: [id number]  Provider:  Rexene Edison. Mariea Clonts, D.O., C.M.D. Location:  [facility]  PCP: [name], MD  Code Status: DNR Goals of Care: Advanced Directive information Does patient have an advance directive?: Yes, Type of Advance Directive: [place];Living will;Out of facility DNR (pink MOST or yellow form), Pre-existing out of facility DNR order (yellow form or pink MOST form): Yellow form placed in chart (order not valid for inpatient use), Does patient want to make changes to advanced directive?: No - Patient declined These are in his [facility] chart  Chief Complaint  Patient presents with  . New Admit To SNF    HPI: 81 y.o. male with history of Parkinson's disease followed by Dr. Jannifer Franklin, gait disorder (related), fatty liver disease, Crohn's disease with h/o partial bowel resection (has had two doses of remicade so far (last was 08/09/14) and prednisone is being tapered), diverticulosis, CAD s/p LAD bare metal stent in 2009, ischemic cardiomyopathy with improved EF to 60% most recently, RBBB, htn, hyperlipidemia, GERD with esophagitis, memory loss, melanoma, renal calculi, hearing loss, and osteoporosis was admitted to rehab s/p 3 falls within 2 days.  Once occurred in the shower and he remembers falling over something. During one of these episodes, his pulse was elevated and he was short of breath.  Per Dr. Virgina Jock (his PCP)'s note, he was seen in teh ED 5/30 for 1 month of RUE rash, seen b derm with no change after course of abx (keflex).  He had presented with dyspnea, cough and malaise for 3 days.  His RUE was more swollen for a day.  He had no increase in pain, fever, or gi symptoms.  There was a well demarcated erythematous plaque with scaling and swelling surrounding.  He had negative venous doppler, CT showed no PE, but did show patchy subpleural opacity felt to be atelectasis in left lower lobe.  He had  no acute cardiopulmonary disease.  CMP, CBC, Trop I, BNP, and UA were all negative.  EKG showed NSR, no STEMI, HR 99, RBBB.  ED vitals:  bp 146/84, HR 94, T 97.9, RR 18, POX 95%.  He was sent home on Bactrim DS for the arm cellulitis.    When seen today, he had no complaints except the pain, swelling of his right arm.  He was requesting ice to be applied to the arm.  He denied any other pain.  He says he is chronically dyspneic.  Denies chest pain, constipation.  Diarrhea better since remicade.  Sleeps well, eats well.  MMSE pending.  Walks with walker.  Denies dizziness, balance difficulty, chest pain, palpitations before falls.  ROS: Review of Systems  Constitutional: Negative for chills.  HENT: Positive for hearing loss.        Hearing aides  Eyes: Negative for blurred vision.  Respiratory: Positive for shortness of breath. Negative for cough, hemoptysis, sputum production and wheezing.   Cardiovascular: Negative for chest pain.  Gastrointestinal: Negative for abdominal pain, diarrhea, constipation, blood in stool and melena.  Genitourinary: Negative for dysuria.  Musculoskeletal: Positive for falls.  Neurological: Positive for tremors. Negative for weakness and headaches.       Shuffling gait  Psychiatric/Behavioral: Positive for memory loss.    Past Medical History  Diagnosis Date  . Hypertension   . Hyperlipidemia   . Coronary artery disease     s/p ANTERIOR STEMI 1/09 at [place];  treated with a bare-metal stent to the LAD; Myoview 11/11 EF 71%, no scar, no ischemia;     cath 12/20/10:   pLAD 30-40%, stent ok, ? If stent oversized, EF 60% and low LVEDP  . GERD (gastroesophageal reflux disease)   . Other esophagitis   . Diaphragmatic hernia without mention of obstruction or gangrene   . Ulcerative (chronic) ileocolitis   . Diverticulosis of colon (without mention of hemorrhage)   . Other chronic nonalcoholic liver disease   . Regional enteritis of small intestine   . Flatulence,  eructation, and gas pain   . Ischemic cardiomyopathy     EF initially 35-40% after MI 1/09; echo 7/08: EF 60%  . Fatty liver     on ultrasound of 10/2009  . Parkinson's disease   . Gait disorder   . Memory disorder   . Osteoporosis   . Renal calculi   . Melanoma   . RBBB 09/02/2013  . HOH (hard of hearing)     Hearing aids   Past Surgical History  Procedure Laterality Date  . Cardiac catheterization  09/25/06    EF 35-40% but more recently 60%  . Cholecystectomy    . Appendectomy    . Back surgery      L3, L4, L5  . Tonsillectomy    . Partial bowel resection    . Coronary artery stent placement    . Melanoma resection    . Cataract extraction      Bilateral   Social History:   reports that he has never smoked. He has never used smokeless tobacco. He reports that he drinks alcohol. He reports that he does not use illicit drugs.  Family History  Problem Relation Age of Onset  . Coronary artery disease    . Heart failure Mother   . Emphysema Father   . Heart disease Brother   . Heart disease Brother   . Colon cancer Brother     No Known Allergies  Medications: Patient's Medications  New Prescriptions   No medications on file  Previous Medications   ACETAMINOPHEN (TYLENOL) 325 MG TABLET    Take 650 mg by mouth every 6 (six) hours as needed for pain.   ADVAIR DISKUS 250-50 MCG/DOSE AEPB    Inhale 1 puff into the lungs daily.    ANDROGEL PUMP 20.25 MG/ACT (1.62%) GEL    Apply 1 application topically daily.    ASPIRIN EC 81 MG TABLET    Take 81 mg by mouth daily.   ATORVASTATIN (LIPITOR) 40 MG TABLET    Take 40 mg by mouth daily.     BUDESONIDE 9 MG TB24    Take 9 mg by mouth daily.    CALCIUM CARBONATE (CALCIUM 600) 600 MG TABS TABLET    Take 600 mg by mouth daily with breakfast.   CARBIDOPA-LEVODOPA (SINEMET IR) 25-100 MG PER TABLET    1.5 tablets in the morning and evening, one tablet at midday and at dinnertime   CARVEDILOL (COREG) 6.25 MG TABLET    Take 6.25 mg by  mouth 2 (two) times daily with a meal.   CEPHALEXIN (KEFLEX) 500 MG CAPSULE    Take 500 mg by mouth 2 (two) times daily.   CHOLECALCIFEROL (VITAMIN D-3) 1000 UNITS CAPS    Take 1,000 Units by mouth daily.   CHOLESTYRAMINE (QUESTRAN) 4 G PACKET    TAKE 1 PACKET BY MOUTH TWICE DAILY WITH A MEAL   ISOSORBIDE MONONITRATE (IMDUR) 30 MG 24 HR  TABLET    TAKE ONE TABLET EACH DAY   LOSARTAN (COZAAR) 25 MG TABLET    Take 25 mg by mouth daily.   MESALAMINE (PENTASA) 250 MG CR CAPSULE    TAKE TWO CAPSULES THREE TIMES DAILY   MULTIPLE VITAMINS-MINERALS (CENTRUM SILVER PO)    Take 1 tablet by mouth daily.   MUPIROCIN OINTMENT (BACTROBAN) 2 %    Apply 1 application topically daily as needed (skin irritation).    NITROSTAT 0.4 MG SL TABLET    AS DIRECTED   POTASSIUM BICARBONATE 99 MG CAPS    Take 88 mg by mouth daily.    PREDNISONE (DELTASONE) 10 MG TABLET    Take 2 tablets (20 mg total) by mouth 2 (two) times daily with a meal.   REMICADE 100 MG INJECTION    Inject 100 mg into the vein. Patient is not sure of frequency yet   SULFAMETHOXAZOLE-TRIMETHOPRIM (BACTRIM DS,SEPTRA DS) 800-160 MG PER TABLET    Take 1 tablet by mouth 3 (three) times daily.   TAMSULOSIN (FLOMAX) 0.4 MG CAPS CAPSULE    Take 0.4 mg by mouth daily.  Modified Medications   No medications on file  Discontinued Medications   No medications on file     Physical Exam: Filed Vitals:   09/07/14 1620  BP: 121/69  Pulse: 98  Temp: 96.5 F (35.8 C)  Resp: 21  Height: 5' 9.8" (1.773 m)  Weight: 174 lb 12.8 oz (79.289 kg)  SpO2: 96%   Body mass index is 25.22 kg/(m^2). Physical Exam  Constitutional: He is oriented to person, place, and time. He appears well-developed and well-nourished. No distress.  HENT:  Head: Normocephalic and atraumatic.  Right Ear: External ear normal.  Left Ear: External ear normal.  Nose: Nose normal.  Mouth/Throat: Oropharynx is clear and moist.  Eyes: Conjunctivae and EOM are normal. Pupils are equal,  round, and reactive to light. No scleral icterus.  Neck: Normal range of motion. Neck supple. No JVD present.  Cardiovascular: Normal rate, regular rhythm, normal heart sounds and intact distal pulses.   Pulmonary/Chest: Breath sounds normal.  Seems to breath with increased effort  Abdominal: Soft. Bowel sounds are normal. He exhibits distension. He exhibits no mass. There is no tenderness. There is no rebound and no guarding.  Musculoskeletal: Normal range of motion. He exhibits no tenderness.  No edema of legs  Lymphadenopathy:    He has no cervical adenopathy.  Neurological: He is alert and oriented to person, place, and time.  Skin: Skin is warm and dry.  Red faced; has erythema and raised plaque region on his right forearm that is warm and tender to touch--notes some improvement  Psychiatric: He has a normal mood and affect.     Labs reviewed: Basic Metabolic Panel:  Recent Labs  05/30/14 1359 09/05/14 1128  NA 138 136  K 3.6 3.9  CL 103 102  CO2 28 24  GLUCOSE 137* 143*  BUN 12 19  CREATININE 1.05 0.86  CALCIUM 8.9 8.6*   Liver Function Tests:  Recent Labs  09/05/14 1128  AST 31  ALT 14*  ALKPHOS 43  BILITOT 2.7*  PROT 6.3*  ALBUMIN 3.5   No results for input(s): LIPASE, AMYLASE in the last 8760 hours. No results for input(s): AMMONIA in the last 8760 hours. CBC:  Recent Labs  09/05/14 1128  WBC 8.2  NEUTROABS 6.4  HGB 16.0  HCT 48.2  MCV 98.6  PLT 104*   Cardiac Enzymes: No results  for input(s): CKTOTAL, CKMB, CKMBINDEX, TROPONINI in the last 8760 hours. BNP: Invalid input(s): POCBNP  No results found for: HGBA1C Lab Results  Component Value Date   TSH 1.03 12/14/2010   Lab Results  Component Value Date   VITAMINB12 311 06/12/2006   Lab Results  Component Value Date   FOLATE > 20.0 ng/mL 06/12/2006  Imaging and Procedures obtained prior to SNF admission: See hpi  Patient Care Team: Shon Baton, MD as PCP - General (Internal  Medicine) Jolaine Artist, MD (Cardiology) [name] [name], MD as Consulting Physician (Cardiology) [name], MD as Attending Physician (Gastroenterology) [name], MD as Consulting Physician (Neurology)  Assessment/Plan 1. Generalized weakness -here for PT, OT due to multiple falls  2. Falls frequently -at least one episode in context of tachycardia, but workup in ED was unremarkable aside from a change in his abx to bactrim from keflex for his right arm cellulitis  3. Cellulitis of right upper extremity -unusual appearance--has plaque on arm and erythema and warmth surrounding it -has already seen dermatology--notes would be helpful from them -need to verify where he is applying his androgel--could he be having a skin reaction to it?  4. Parkinson's disease -continues on sinemet -certainly contributing to his falling--is using rolling collapseable walker to ambulate -needs PT, OT for strengthening and to make sure he's safe in his home  5. Memory loss -unclear what etiology is for this--may be related to PD dementia vs. Cirrhosis  -check ammonia levels  -had other related labs in ED which were unremarkable as above  6. Fatty liver disease, nonalcoholic -check ammonia -social history--drinks "occasionally" and historical records from GI indicated nonalcoholic etiology  -some of his balance difficulty, memory loss could be from decompensating cirrhosis also  7. Regional enteritis of small intestine, unspecified complication -has done well with his 2 doses of remicade so far--benadryl was used in context of this treatment only -is on taper off of prednisone previously used for this -has pentasa also for this  8. Erosive esophagitis -has not been helped by steroids  Functional status:  Uses walker to ambulate, currently needs some assist with bathing and dressing for safety  Family/ staff Communication: discussed with nurse manager and rehab  nurse  Labs/tests ordered:  Ammonia level  Mirtha Jain L. Aldean Suddeth, D.O. [facility] [address], [place] [address] Cell Phone (Mon-Fri 8am-5pm):  [phone] On Call:  [phone] & follow prompts after 5pm & weekends Office Phone:  [phone] Office Fax:  [phone]

## 2014-09-08 NOTE — Telephone Encounter (Signed)
Returned call to [place] at [facility] no answer.

## 2014-09-09 NOTE — Telephone Encounter (Signed)
Returned call to [place] at [facility] Left message on personal voice mail Dr.Jordan advised keep appointment with him 09/23/14 at 8:00 am.Advised to call back if needed.

## 2014-09-12 ENCOUNTER — Encounter: Payer: Self-pay | Admitting: Adult Health

## 2014-09-12 ENCOUNTER — Non-Acute Institutional Stay (SKILLED_NURSING_FACILITY): Payer: Medicare Other | Admitting: Adult Health

## 2014-09-12 DIAGNOSIS — R531 Weakness: Secondary | ICD-10-CM | POA: Diagnosis not present

## 2014-09-12 DIAGNOSIS — R296 Repeated falls: Secondary | ICD-10-CM | POA: Diagnosis not present

## 2014-09-12 DIAGNOSIS — R413 Other amnesia: Secondary | ICD-10-CM | POA: Diagnosis not present

## 2014-09-12 DIAGNOSIS — K7460 Unspecified cirrhosis of liver: Secondary | ICD-10-CM

## 2014-09-12 DIAGNOSIS — L03113 Cellulitis of right upper limb: Secondary | ICD-10-CM | POA: Diagnosis not present

## 2014-09-12 DIAGNOSIS — K50919 Crohn's disease, unspecified, with unspecified complications: Secondary | ICD-10-CM

## 2014-09-12 NOTE — Progress Notes (Addendum)
[name], male   DOB: 09-30-1928, 81 y.o.   MRN: [id number]   Location:  [facility]  PCP: [name], MD  Code Status: DNR      Chief Complaint  Patient presents with  . Discharge Note    HPI: 81 y.o. male with history of Parkinson's disease followed by Dr. Jannifer Franklin, gait disorder (related), fatty liver disease, Crohn's disease with h/o partial bowel resection (has had two doses of remicade so far (last was 08/09/14) and prednisone is being tapered), diverticulosis, CAD s/p LAD bare metal stent in 2009, ischemic cardiomyopathy with improved EF to 60% most recently, RBBB, htn, hyperlipidemia, GERD with esophagitis, memory loss, melanoma, renal calculi, hearing loss, and osteoporosis was admitted to rehab s/p 3 falls within 2 days.  Once occurred in the shower and he remembers falling over something. During one of these episodes, his pulse was elevated and he was short of breath.  Per Dr. Virgina Jock (his PCP)'s note, he was seen in teh ED 5/30 for 1 month of RUE rash, seen b derm with no change after course of abx (keflex).  He had presented with dyspnea, cough and malaise for 3 days.  His RUE was more swollen for a day.  He had no increase in pain, fever, or gi symptoms.  There was a well demarcated erythematous plaque with scaling and swelling surrounding.  He had negative venous doppler, CT showed no PE, but did show patchy subpleural opacity felt to be atelectasis in left lower lobe.  He had no acute cardiopulmonary disease.  CMP, CBC, Trop I, BNP, and UA were all negative.  EKG showed NSR, no STEMI, HR 99, RBBB.  ED vitals:  bp 146/84, HR 94, T 97.9, RR 18, POX 95%.  He was sent home on Bactrim DS for the arm cellulitis.    Update: I am evaluating him for discharge today. He continues to have erythema and some pain to the RUE but the edema and redness has improved. He has been afebrile with no bouts of increased confusion Overall, he has improved functionally per the wife. He  is able to dress himself and change his brief, although he continues with incontinence (this is rather chronic).  He has chronic diarrhea due to Crohn's dz and he reports no loose stools in several days.  His MMSE during his stay was 26/30. He was able to perform the clock test but had difficulty with serial 7's and remembering the year. He has noted memory loss prior to his stay here in rehab per the neuro notes. An ammonia level was checked which was WNL, as well as other work up in the hospital.   ROS: Review of Systems  Constitutional: Negative for fever and chills.  Respiratory: Negative for cough and shortness of breath.   Cardiovascular: Negative for chest pain and leg swelling.  Gastrointestinal: Negative for heartburn, nausea, vomiting, abdominal pain and constipation.  Genitourinary: Negative for dysuria.       Intermittent incontinence  Musculoskeletal: Negative for falls.  Neurological: Negative for dizziness, tremors, focal weakness, seizures and loss of consciousness.  Psychiatric/Behavioral: Positive for memory loss. The patient is not nervous/anxious.     Past Medical History  Diagnosis Date  . Hypertension   . Hyperlipidemia   . Coronary artery disease     s/p ANTERIOR STEMI 1/09 at [place]; treated with a bare-metal stent to the LAD; Myoview 11/11 EF 71%, no scar, no ischemia;     cath 12/20/10:  pLAD 30-40%, stent ok, ? If stent oversized, EF 60% and low LVEDP  . GERD (gastroesophageal reflux disease)   . Other esophagitis   . Diaphragmatic hernia without mention of obstruction or gangrene   . Ulcerative (chronic) ileocolitis   . Diverticulosis of colon (without mention of hemorrhage)   . Other chronic nonalcoholic liver disease   . Regional enteritis of small intestine   . Flatulence, eructation, and gas pain   . Ischemic cardiomyopathy     EF initially 35-40% after MI 1/09; echo 7/08: EF 60%  . Fatty liver     on ultrasound of 10/2009  . Parkinson's disease   .  Gait disorder   . Memory disorder   . Osteoporosis   . Renal calculi   . Melanoma   . RBBB 09/02/2013  . HOH (hard of hearing)     Hearing aids   Past Surgical History  Procedure Laterality Date  . Cardiac catheterization  09/25/06    EF 35-40% but more recently 60%  . Cholecystectomy    . Appendectomy    . Back surgery      L3, L4, L5  . Tonsillectomy    . Partial bowel resection    . Coronary artery stent placement    . Melanoma resection    . Cataract extraction      Bilateral   Social History:   reports that he has never smoked. He has never used smokeless tobacco. He reports that he drinks alcohol. He reports that he does not use illicit drugs.  Family History  Problem Relation Age of Onset  . Coronary artery disease    . Heart failure Mother   . Emphysema Father   . Heart disease Brother   . Heart disease Brother   . Colon cancer Brother     No Known Allergies  Medications: Patient's Medications  New Prescriptions   No medications on file  Previous Medications   ACETAMINOPHEN (TYLENOL) 325 MG TABLET    Take 650 mg by mouth every 6 (six) hours as needed for pain.   ADVAIR DISKUS 250-50 MCG/DOSE AEPB    Inhale 1 puff into the lungs daily.    ANDROGEL PUMP 20.25 MG/ACT (1.62%) GEL    Apply 1 application topically daily.    ASPIRIN EC 81 MG TABLET    Take 81 mg by mouth daily.   ATORVASTATIN (LIPITOR) 40 MG TABLET    Take 40 mg by mouth daily.     BUDESONIDE 9 MG TB24    Take 9 mg by mouth daily.    CALCIUM CARBONATE (CALCIUM 600) 600 MG TABS TABLET    Take 600 mg by mouth daily with breakfast.   CARBIDOPA-LEVODOPA (SINEMET IR) 25-100 MG PER TABLET    1.5 tablets in the morning and evening, one tablet at midday and at dinnertime   CARVEDILOL (COREG) 6.25 MG TABLET    Take 6.25 mg by mouth 2 (two) times daily with a meal.   CHOLECALCIFEROL (VITAMIN D-3) 1000 UNITS CAPS    Take 1,000 Units by mouth daily.   CHOLESTYRAMINE (QUESTRAN) 4 G PACKET    TAKE 1 PACKET BY  MOUTH TWICE DAILY WITH A MEAL   ISOSORBIDE MONONITRATE (IMDUR) 30 MG 24 HR TABLET    TAKE ONE TABLET EACH DAY   LOSARTAN (COZAAR) 25 MG TABLET    Take 25 mg by mouth daily.   MESALAMINE (PENTASA) 250 MG CR CAPSULE    TAKE TWO CAPSULES THREE TIMES DAILY  MULTIPLE VITAMINS-MINERALS (CENTRUM SILVER PO)    Take 1 tablet by mouth daily.   MUPIROCIN OINTMENT (BACTROBAN) 2 %    Apply 1 application topically daily as needed (skin irritation).    NITROSTAT 0.4 MG SL TABLET    AS DIRECTED   POTASSIUM BICARBONATE 99 MG CAPS    Take 99 mg by mouth daily.    PREDNISONE (DELTASONE) 10 MG TABLET    Take 2 tablets (20 mg total) by mouth 2 (two) times daily with a meal.   REMICADE 100 MG INJECTION    Inject 100 mg into the vein. Patient is not sure of frequency yet   SULFAMETHOXAZOLE-TRIMETHOPRIM (BACTRIM DS,SEPTRA DS) 800-160 MG PER TABLET    Take 1 tablet by mouth 3 (three) times daily.   TAMSULOSIN (FLOMAX) 0.4 MG CAPS CAPSULE    Take 0.4 mg by mouth daily.  Modified Medications   No medications on file  Discontinued Medications   No medications on file     Physical Exam: Filed Vitals:   09/12/14 0958  BP: 109/64  Pulse: 97  Temp: 98.4 F (36.9 C)  Resp: 20   There is no weight on file to calculate BMI. Physical Exam  Constitutional: He is oriented to person, place, and time. He appears well-developed and well-nourished. No distress.  HENT:  Head: Normocephalic and atraumatic.  Right Ear: External ear normal.  Left Ear: External ear normal.  Nose: Nose normal.  Mouth/Throat: Oropharynx is clear and moist.  Eyes: Conjunctivae and EOM are normal. Pupils are equal, round, and reactive to light. No scleral icterus.  Neck: Normal range of motion. Neck supple. No JVD present.  Cardiovascular: Normal rate, regular rhythm, normal heart sounds and intact distal pulses.   Pulmonary/Chest: Breath sounds normal.  Seems to breath with increased effort  Abdominal: Soft. Bowel sounds are normal. He  exhibits distension. He exhibits no mass. There is no tenderness. There is no rebound and no guarding.  Musculoskeletal: Normal range of motion. He exhibits no tenderness.  No edema of legs  Lymphadenopathy:    He has no cervical adenopathy.  Neurological: He is alert and oriented to person, place, and time.  Skin: Skin is warm and dry.  Erythema to face. Erythema well demarcated to the RUE on the forearm down to the wrist. No edema which is improved, no tenderness  Psychiatric: He has a normal mood and affect.     Labs reviewed: Basic Metabolic Panel:  Recent Labs  05/30/14 1359 09/05/14 1128  NA 138 136  K 3.6 3.9  CL 103 102  CO2 28 24  GLUCOSE 137* 143*  BUN 12 19  CREATININE 1.05 0.86  CALCIUM 8.9 8.6*   Liver Function Tests: Ammonia 38 on 09/08/14  Recent Labs  09/05/14 1128  AST 31  ALT 14*  ALKPHOS 43  BILITOT 2.7*  PROT 6.3*  ALBUMIN 3.5   No results for input(s): LIPASE, AMYLASE in the last 8760 hours. No results for input(s): AMMONIA in the last 8760 hours. CBC:  Recent Labs  09/05/14 1128  WBC 8.2  NEUTROABS 6.4  HGB 16.0  HCT 48.2  MCV 98.6  PLT 104*   Cardiac Enzymes: No results for input(s): CKTOTAL, CKMB, CKMBINDEX, TROPONINI in the last 8760 hours. BNP: Invalid input(s): POCBNP  No results found for: HGBA1C Lab Results  Component Value Date   TSH 1.03 12/14/2010   Lab Results  Component Value Date   VITAMINB12 311 06/12/2006   Lab Results  Component Value  Date   FOLATE > 20.0 ng/mL 06/12/2006  Imaging and Procedures obtained prior to SNF admission: See hpi  Patient Care Team: Shon Baton, MD as PCP - General (Internal Medicine) Jolaine Artist, MD (Cardiology) [name] [name], MD as Consulting Physician (Cardiology) [name], MD as Attending Physician (Gastroenterology) [name], MD as Consulting Physician (Neurology)  Assessment/Plan 1. Generalized weakness -Improved, continue PT at home  2. Falls  frequently -gait training with walker, improvement noted in mobility with walker, see above  3. Cellulitis of right upper extremity -improved but not resolved -Continue Bactrim BID and extend course to complete on 09/20/14 due to unresolved erythema and pain -F/U with Dr. Virgina Jock and dermatology  4. Parkinson's disease -continues on sinemet  5. Memory loss -unclear what etiology is for this--may be related to PD dementia vs. Cirrhosis  -His wife has agreed to help him with his pill box and ensure that he can keep up with his f/u apts -F/U with Dr. Jannifer Franklin  6. Fatty liver disease, nonalcoholic -stable with chronically elevated Bilirubin and normal LFTS. -continue to monitor  7. Regional enteritis of small intestine, unspecified complication -Improved per pt -Continue current meds and f/u with GI -Due for next remicade RX -needs to separate questran from other meds  Overall he is ready for discharge with the caveat that his wife will be overseeing his care. He is will need her help in med management and overall coordination of care.  I spent >30 min on this discharge in coordination of care  Cindi Carbon, [place] [phone]

## 2014-09-14 ENCOUNTER — Telehealth: Payer: Self-pay | Admitting: Gastroenterology

## 2014-09-14 NOTE — Telephone Encounter (Signed)
Pt has been given appt with Amy for 09/22/14, he has an appt with Dr Virgina Jock today he will keep that.  He also has an appt with Dr Ardis Hughs for 10/18/14.  That appt will be kept as well for any f/u that Amy may recommend.

## 2014-09-22 ENCOUNTER — Ambulatory Visit (INDEPENDENT_AMBULATORY_CARE_PROVIDER_SITE_OTHER): Payer: Medicare Other | Admitting: Physician Assistant

## 2014-09-22 ENCOUNTER — Encounter (INDEPENDENT_AMBULATORY_CARE_PROVIDER_SITE_OTHER): Payer: Self-pay

## 2014-09-22 ENCOUNTER — Encounter: Payer: Self-pay | Admitting: Physician Assistant

## 2014-09-22 ENCOUNTER — Other Ambulatory Visit (INDEPENDENT_AMBULATORY_CARE_PROVIDER_SITE_OTHER): Payer: Medicare Other

## 2014-09-22 VITALS — BP 88/56 | HR 100 | Ht 67.25 in | Wt 172.1 lb

## 2014-09-22 DIAGNOSIS — K50919 Crohn's disease, unspecified, with unspecified complications: Secondary | ICD-10-CM | POA: Diagnosis not present

## 2014-09-22 LAB — COMPREHENSIVE METABOLIC PANEL
ALT: 35 U/L (ref 0–53)
AST: 28 U/L (ref 0–37)
Albumin: 3.6 g/dL (ref 3.5–5.2)
Alkaline Phosphatase: 44 U/L (ref 39–117)
BUN: 26 mg/dL — ABNORMAL HIGH (ref 6–23)
CO2: 24 mEq/L (ref 19–32)
Calcium: 8.7 mg/dL (ref 8.4–10.5)
Chloride: 99 mEq/L (ref 96–112)
Creatinine, Ser: 1.17 mg/dL (ref 0.40–1.50)
GFR: 63.17 mL/min (ref >60.00)
Glucose, Bld: 159 mg/dL — ABNORMAL HIGH (ref 70–99)
Potassium: 4 mEq/L (ref 3.5–5.1)
Sodium: 132 mEq/L — ABNORMAL LOW (ref 135–145)
Total Bilirubin: 1.4 mg/dL — ABNORMAL HIGH (ref 0.2–1.2)
Total Protein: 6 g/dL (ref 6.0–8.3)

## 2014-09-22 LAB — CBC WITH DIFFERENTIAL/PLATELET
Basophils Absolute: 0 10*3/uL (ref 0.0–0.1)
Basophils Relative: 0.1 % (ref 0.0–3.0)
Eosinophils Absolute: 0 10*3/uL (ref 0.0–0.7)
Eosinophils Relative: 0.1 % (ref 0.0–5.0)
HCT: 43.9 % (ref 39.0–52.0)
Hemoglobin: 14.6 g/dL (ref 13.0–17.0)
Lymphocytes Relative: 13.4 % (ref 12.0–46.0)
Lymphs Abs: 1.2 10*3/uL (ref 0.7–4.0)
MCHC: 33.3 g/dL (ref 30.0–36.0)
MCV: 97.5 fl (ref 78.0–100.0)
Monocytes Absolute: 0.5 10*3/uL (ref 0.1–1.0)
Monocytes Relative: 5.5 % (ref 3.0–12.0)
Neutro Abs: 7.3 10*3/uL (ref 1.4–7.7)
Neutrophils Relative %: 80.9 % — ABNORMAL HIGH (ref 43.0–77.0)
Platelets: 184 10*3/uL (ref 150.0–400.0)
RBC: 4.5 Mil/uL (ref 4.22–5.81)
RDW: 16.3 % — ABNORMAL HIGH (ref 11.5–15.5)
WBC: 9 10*3/uL (ref 4.0–10.5)

## 2014-09-22 NOTE — Patient Instructions (Signed)
Stop the Questran. Continue the Pentasa, 6 tablets daily. Continue Uceris 9 mg daily.  Prednisone Instructions: Take 10 mg twice daily x 2 weeks. Then Decrease to 10 mg by mouth once daily x 3 weeks Then 5 mg by mouth daily.   We made you a future appointment with Dr. Ardis Hughs for 11-28-2014 at 11:00 am .  If you have any problems before that time, Call our office and Amy Esterwood PA will be glad to see you.

## 2014-09-22 NOTE — Progress Notes (Signed)
i agree with the above note, plan 

## 2014-09-22 NOTE — Progress Notes (Signed)
[name], male   DOB: 1928/11/23, 81 y.o.   MRN: [id number]   Subjective:    [name], male    DOB: Jul 05, 1928, 81 y.o.   MRN: [id number]  HPI [name] [name] is a very nice 81 year old white male known to Dr. Ardis [name] with history of long-standing Crohn's disease with remote ileocecectomy. He has Parkinson's disease, hypertension. He had undergone recent colonoscopy in April 2016 because of complaints of diarrhea and abdominal pain was noted to have scattered diffuse diverticulosis and a large ulcer at his anastomosis which also had luminal narrowing. He was started on prednisone at that time and plans were made to initiate Remicade. He comes in today for follow-up. He has had 2 Remicade infusions and will have the third next week. He says he feels significantly better has tolerated the infusions without difficulty and is not having any current problems with diarrhea or abdominal pain. He would like to come off of any medicines that he does not need. His wife says his appetite has been good and he does have generalized weakness and has some occasional confusion. He did have a was felt to be a cellulitis of his right forearm in late May took a short course of antibiotic which she tolerated and had no change in abdominal symptoms.  Review of Systems Pertinent positive and negative review of systems were noted in the above HPI section.  All other review of systems was otherwise negative.  Outpatient Encounter Prescriptions as of 09/22/2014  Medication Sig  . acetaminophen (TYLENOL) 325 MG tablet Take 650 mg by mouth every 6 (six) hours as needed for pain.  Marland Kitchen ADVAIR DISKUS 250-50 MCG/DOSE AEPB Inhale 1 puff into the lungs daily.   . ANDROGEL PUMP 20.25 MG/ACT (1.62%) GEL Apply 1 application topically daily.   Marland Kitchen aspirin EC 81 MG tablet Take 81 mg by mouth daily.  Marland Kitchen atorvastatin (LIPITOR) 40 MG tablet Take 40 mg by mouth daily.    . Budesonide 9 MG TB24 Take 9 mg by mouth daily.    . calcium carbonate (CALCIUM 600) 600 MG TABS tablet Take 600 mg by mouth daily with breakfast.  . carbidopa-levodopa (SINEMET IR) 25-100 MG per tablet 1.5 tablets in the morning and evening, one tablet at midday and at dinnertime  . carvedilol (COREG) 6.25 MG tablet Take 6.25 mg by mouth 2 (two) times daily with a meal.  . Cholecalciferol (VITAMIN D-3) 1000 UNITS CAPS Take 1,000 Units by mouth daily.  . cholestyramine (QUESTRAN) 4 G packet TAKE 1 PACKET BY MOUTH TWICE DAILY WITH A MEAL  . isosorbide mononitrate (IMDUR) 30 MG 24 hr tablet TAKE ONE TABLET EACH DAY (Patient taking differently: TAKE 30MG BY MOUTH EACH DAY)  . losartan (COZAAR) 25 MG tablet Take 25 mg by mouth daily.  . mesalamine (PENTASA) 250 MG CR capsule TAKE TWO CAPSULES THREE TIMES DAILY (Patient taking differently: Take 500 mg by mouth 3 (three) times daily. )  . Multiple Vitamins-Minerals (CENTRUM SILVER PO) Take 1 tablet by mouth daily.  . mupirocin ointment (BACTROBAN) 2 % Apply 1 application topically daily as needed (skin irritation).   . pantoprazole (PROTONIX) 40 MG tablet Take 1 tablet by mouth 2 (two) times daily.  . Potassium Bicarbonate 99 MG CAPS Take 99 mg by mouth daily.   . predniSONE (DELTASONE) 10 MG tablet Take 2 tablets (20 mg total) by mouth 2 (two) times daily with a meal. (Patient taking differently: Take 15 mg by mouth 2 (  two) times daily with a meal. )  . REMICADE 100 MG injection Inject 100 mg into the vein. Patient is not sure of frequency yet  . tamsulosin (FLOMAX) 0.4 MG CAPS capsule Take 0.4 mg by mouth daily.  Marland Kitchen NITROSTAT 0.4 MG SL tablet AS DIRECTED (Patient not taking: Reported on 09/22/2014)  . [DISCONTINUED] sulfamethoxazole-trimethoprim (BACTRIM DS,SEPTRA DS) 800-160 MG per tablet Take 1 tablet by mouth 3 (three) times daily.   Facility-Administered Encounter Medications as of 09/22/2014  Medication  . acetaminophen (TYLENOL) tablet 650 mg  . diphenhydrAMINE (BENADRYL) capsule 50 mg  .  inFLIXimab (REMICADE) 5 mg/kg = 400 mg in sodium chloride 0.9 % 250 mL infusion   No Known Allergies Patient Active Problem List   Diagnosis Date Noted  . Memory loss of 09/12/2014  . Dyspnea on exertion 04/22/2014  . Dehydration 09/28/2012  . Acute kidney injury 09/28/2012  . Memory loss 08/19/2012  . Paralysis agitans 08/19/2012  . Abnormality of gait 08/19/2012  . SBO (small bowel obstruction) 06/04/2011  . Dysphagia 05/24/2011  . Tinea corporis 01/04/2011  . Angina of effort 12/14/2010  . Fatigue 12/14/2010  . NOSEBLEED 05/23/2010  . NONSPECIFIC ABNORMAL RESULTS LIVR FUNCTION STUDY 10/20/2009  . [facility] INTESTINE 03/16/2008  . HYPERLIPIDEMIA 04/29/2007  . Essential hypertension 04/29/2007  . Coronary atherosclerosis 04/29/2007  . GERD 04/29/2007  . Crohn's disease 04/29/2007  . SMALL BOWEL OBSTRUCTION 04/29/2007  . DIVERTICULOSIS, COLON 04/29/2007  . FATTY LIVER DISEASE 08/30/2006  . HEMORRHOIDS 03/26/2005  . ULCERATIVE ILEOCOLITIS 12/11/1999  . Erosive esophagitis 09/29/1997  . HIATAL HERNIA 09/29/1997   History   Social History  . Marital Status: Married    Spouse Name: N/A  . Number of Children: 3  . Years of Education: college   Occupational History  . CEO-retired   . CEO    Social History Main Topics  . Smoking status: Never Smoker   . Smokeless tobacco: Never Used  . Alcohol Use: Yes     Comment: occasional  . Drug Use: No  . Sexual Activity: Not on file   Other Topics Concern  . Not on file   Social History Narrative   Patient is right handed.   Patient drinks about 1 cup of caffeine daily.    [name] [name] family history includes Colon cancer in his brother; Coronary artery disease in an other family member; Emphysema in his father; Heart disease in his brother and brother; Heart failure in his mother.      Objective:    Filed Vitals:   09/22/14 1456  BP: 88/56  Pulse: 100    Physical Exam  well-developed elderly white  male in no acute distress, pleasant ambulates with a walker accompanied by his wife blood pressure 88/56 pulse 100 height 5 foot 7 weight 172. HEENT; nontraumatic normocephalic EOMI PERRLA sclera anicteric, Mild cushingoid features with ruddy dose of complexion and fullness of his face, Neck ;supple no JVD, Cardiovascular ;regular rate and rhythm with S1-S2 no murmur rub or gallop, Pulmonary; clear bilaterally Abdomen ;protuberant soft nontender nondistended bowel sounds are present there is no palpable mass or hepatosplenomegaly exam not done, Extremities; no clubbing cyanosis or edema skin warm and dry he has both forearms bandaged and has very thin skin with multiple ecchymoses., Neuropsych ;mood and affect - , patient does have Parkinson's.       Assessment & Plan:   #1 81 yo male with long standing Crohns disease s/p remote ileo-cecectomy with active Crohns at anastomosis  Pt is in induction phase of Remicade and sxs much improved  Plan; cbc,cmet  Continue Remicade  66mlkg-due for infusion next week, then will be q8 weeks Will decrease prednisone to 10 mg po BID x 2 weeks then 10 mg po daily x 3 weeks then 5 mg daily  Continue Uceris 9 mg po daily Continue Pentasa 500 mg -6 tabs daily  Stop Questran  Follow up office visit in 4 weeks Dr. JArdis Hughsor myself, they will call in the interim for any problems  [name] [name] 09/22/2014   Cc: RShon Baton MD

## 2014-09-23 ENCOUNTER — Ambulatory Visit (INDEPENDENT_AMBULATORY_CARE_PROVIDER_SITE_OTHER): Payer: Medicare Other | Admitting: Cardiology

## 2014-09-23 ENCOUNTER — Encounter: Payer: Self-pay | Admitting: Cardiology

## 2014-09-23 VITALS — BP 128/62 | HR 104 | Ht 69.0 in | Wt 172.9 lb

## 2014-09-23 DIAGNOSIS — R0609 Other forms of dyspnea: Secondary | ICD-10-CM | POA: Diagnosis not present

## 2014-09-23 DIAGNOSIS — I10 Essential (primary) hypertension: Secondary | ICD-10-CM | POA: Diagnosis not present

## 2014-09-23 DIAGNOSIS — I2510 Atherosclerotic heart disease of native coronary artery without angina pectoris: Secondary | ICD-10-CM | POA: Diagnosis not present

## 2014-09-23 NOTE — Progress Notes (Signed)
[name] [name] Date of Birth: [birth year] Medical Record #[id number]  History of Present Illness: [name] [name] is seen for follow up CAD.  He has a  history of CAD. He is s/p anterior STEMI in Jan 2009 at [facility] in [place]. He had a BMS to the LAD. EF was initially 35-40% but subsequently returned to normal. Last myoview in Jan 2016 was normal. Last cath in Sept. 2013 showed a patent stent. 30-40% proximal LAD. EF 60% and normal EDP.  He is  limited by his Parkinsonism. He also has a history of Chron's disease.History of HTN, HL, and GERD. More recently he has had a flair of his Chron's disease and is now on Remicade. His Parkinson's symptoms are also worse and his Sinemet dose was increased. He had a recent cellulitis of the right arm. He was in Rehab for 6 days but is now back in his apartment with a lot of help.  He does note mild ankle swelling. No chest pain. Is quite weak and does get out of breath with exertion but thinks his breathing is doing pretty good. He has lost 6 lbs since January.  He had extensive evaluation in ED in May including head CT, chest CT and venous doppler. No acute findings.  Outpatient Prescriptions Prior to Visit  Medication Sig Dispense Refill  . acetaminophen (TYLENOL) 325 MG tablet Take 650 mg by mouth every 6 (six) hours as needed for pain.    Marland Kitchen ADVAIR DISKUS 250-50 MCG/DOSE AEPB Inhale 1 puff into the lungs daily.     . ANDROGEL PUMP 20.25 MG/ACT (1.62%) GEL Apply 1 application topically daily.     Marland Kitchen aspirin EC 81 MG tablet Take 81 mg by mouth daily.    Marland Kitchen atorvastatin (LIPITOR) 40 MG tablet Take 40 mg by mouth daily.      . Budesonide 9 MG TB24 Take 9 mg by mouth daily.     . calcium carbonate (CALCIUM 600) 600 MG TABS tablet Take 600 mg by mouth daily with breakfast.    . carbidopa-levodopa (SINEMET IR) 25-100 MG per tablet 1.5 tablets in the morning and evening, one tablet at midday and at dinnertime 450 tablet 1  . carvedilol (COREG) 6.25 MG tablet Take  6.25 mg by mouth 2 (two) times daily with a meal.    . Cholecalciferol (VITAMIN D-3) 1000 UNITS CAPS Take 1,000 Units by mouth daily.    . cholestyramine (QUESTRAN) 4 G packet TAKE 1 PACKET BY MOUTH TWICE DAILY WITH A MEAL 60 each 11  . isosorbide mononitrate (IMDUR) 30 MG 24 hr tablet TAKE ONE TABLET EACH DAY (Patient taking differently: TAKE 30MG BY MOUTH EACH DAY) 30 tablet 2  . losartan (COZAAR) 25 MG tablet Take 25 mg by mouth daily.    . mesalamine (PENTASA) 250 MG CR capsule TAKE TWO CAPSULES THREE TIMES DAILY (Patient taking differently: Take 500 mg by mouth 3 (three) times daily. ) 180 capsule 11  . Multiple Vitamins-Minerals (CENTRUM SILVER PO) Take 1 tablet by mouth daily.    . mupirocin ointment (BACTROBAN) 2 % Apply 1 application topically daily as needed (skin irritation).     Marland Kitchen NITROSTAT 0.4 MG SL tablet AS DIRECTED 25 tablet 3  . pantoprazole (PROTONIX) 40 MG tablet Take 1 tablet by mouth 2 (two) times daily.    . Potassium Bicarbonate 99 MG CAPS Take 99 mg by mouth daily.     . predniSONE (DELTASONE) 10 MG tablet Take 2 tablets (20 mg total)  by mouth 2 (two) times daily with a meal. (Patient taking differently: Take 15 mg by mouth 2 (two) times daily with a meal. ) 100 tablet 3  . REMICADE 100 MG injection Inject 100 mg into the vein. Patient is not sure of frequency yet    . tamsulosin (FLOMAX) 0.4 MG CAPS capsule Take 0.4 mg by mouth daily.     Facility-Administered Medications Prior to Visit  Medication Dose Route Frequency Provider Last Rate Last Dose  . acetaminophen (TYLENOL) tablet 650 mg  650 mg Oral Once [name], MD      . diphenhydrAMINE (BENADRYL) capsule 50 mg  50 mg Oral Once [name], MD      . inFLIXimab (REMICADE) 5 mg/kg = 400 mg in sodium chloride 0.9 % 250 mL infusion  5 mg/kg Intravenous Once [name], MD        No Known Allergies  Past Medical History  Diagnosis Date  . Hypertension   . Hyperlipidemia   . Coronary artery disease      s/p ANTERIOR STEMI 1/09 at [facility]; treated with a bare-metal stent to the LAD; Myoview 11/11 EF 71%, no scar, no ischemia;     cath 12/20/10:   pLAD 30-40%, stent ok, ? If stent oversized, EF 60% and low LVEDP  . GERD (gastroesophageal reflux disease)   . Other esophagitis   . Diaphragmatic hernia without mention of obstruction or gangrene   . Ulcerative (chronic) ileocolitis   . Diverticulosis of colon (without mention of hemorrhage)   . Other chronic nonalcoholic liver disease   . Regional enteritis of small intestine   . Flatulence, eructation, and gas pain   . Ischemic cardiomyopathy     EF initially 35-40% after MI 1/09; echo 7/08: EF 60%  . Fatty liver     on ultrasound of 10/2009  . Parkinson's disease   . Gait disorder   . Memory disorder   . Osteoporosis   . Renal calculi   . Melanoma   . RBBB 09/02/2013  . HOH (hard of hearing)     Hearing aids  . Cellulitis     Past Surgical History  Procedure Laterality Date  . Cardiac catheterization  09/25/06    EF 35-40% but more recently 60%  . Cholecystectomy    . Appendectomy    . Back surgery      L3, L4, L5  . Tonsillectomy    . Partial bowel resection    . Coronary artery stent placement    . Melanoma resection    . Cataract extraction      Bilateral    History   Social History  . Marital Status: Married    Spouse Name: N/A  . Number of Children: 3  . Years of Education: college   Occupational History  . CEO-retired   . CEO    Social History Main Topics  . Smoking status: Never Smoker   . Smokeless tobacco: Never Used  . Alcohol Use: Yes     Comment: occasional  . Drug Use: No  . Sexual Activity: Not on file   Other Topics Concern  . None   Social History Narrative   Patient is right handed.   Patient drinks about 1 cup of caffeine daily.    Family History  Problem Relation Age of Onset  . Coronary artery disease    . Heart failure Mother   . Emphysema Father   . Heart disease Brother     .  Heart disease Brother   . Colon cancer Brother     Review of Systems: As noted in HPI.  All other systems were reviewed and are negative.  Physical Exam: BP 128/62 mmHg  Pulse 104  Ht 5' 9"  (1.753 m)  Wt 78.427 kg (172 lb 14.4 oz)  BMI 25.52 kg/m2 Filed Weights   09/23/14 0804  Weight: 78.427 kg (172 lb 14.4 oz)  GENERAL:  Well appearing WM in NAD HEENT:  PERRL, EOMI, sclera are clear. Oropharynx is clear. NECK:  No jugular venous distention, carotid upstroke brisk and symmetric, no bruits, no thyromegaly or adenopathy LUNGS:  Clear to auscultation bilaterally CHEST:  Unremarkable HEART:  RRR,  PMI not displaced or sustained,S1 and S2 within normal limits, no S3, no S4: no clicks, no rubs, no murmurs ABD:  Soft, nontender. BS +, no masses or bruits. No hepatomegaly, no splenomegaly EXT:  2 + pulses throughout, mild ankle edema, no cyanosis no clubbing. Bandages on both arms. SKIN:  Warm and dry.  No rashes NEURO:  Alert and oriented x 3. Cranial nerves II through XII intact. Mild tremor. PSYCH:  Cognitively intact    LABORATORY DATA: Lab Results  Component Value Date   WBC 9.0 09/22/2014   HGB 14.6 09/22/2014   HCT 43.9 09/22/2014   PLT 184.0 09/22/2014   GLUCOSE 159* 09/22/2014   ALT 35 09/22/2014   AST 28 09/22/2014   NA 132* 09/22/2014   K 4.0 09/22/2014   CL 99 09/22/2014   CREATININE 1.17 09/22/2014   BUN 26* 09/22/2014   CO2 24 09/22/2014   TSH 1.03 12/14/2010   INR 1.07 09/29/2012    Assessment / Plan: 1. CAD with prior Anterior STEMI Jan 2009 treated with BMS of LAD. Follow up cath 9/13 showed patent stent and no obstructive disease. Normal LV function. Normal Myoview in January 2016.  Continue ASA, statin, and coreg.   2. Dyspnea on exertion. Chronic. Mostly related to deconditioning.   3. Hyperlipidemia. On statin.   4. HTN controlled.   5. Parkinson's disease.  6. Chron's disease. On Remicade  7. Recent cellulitis right arm.

## 2014-09-23 NOTE — Patient Instructions (Signed)
Continue your current therapy  I will see you in 6 months.   

## 2014-09-26 ENCOUNTER — Telehealth: Payer: Self-pay | Admitting: Physician Assistant

## 2014-09-26 NOTE — Telephone Encounter (Signed)
Yes

## 2014-09-26 NOTE — Telephone Encounter (Signed)
Mrs Creps advised.

## 2014-09-26 NOTE — Telephone Encounter (Signed)
Doc of the Day Mrs. Griffy calls. The patient has had a return of his diarrhea and she reports 3 bowel movements already this morning. He was seen by Nicoletta Ba 09/22/14. He is a patient of Dr Ardis Hughs. He has Crohn's on a Remicade build up and a prednisone taper. Questran was stopped. The patient has restarted the Questran. He has not changed any of the other medications and remains on Prednisone 20 mg daily. See OV of 09/22/14. Please advise.

## 2014-09-26 NOTE — Telephone Encounter (Signed)
Can he use some Lomotil also?

## 2014-09-26 NOTE — Telephone Encounter (Signed)
Continue Questran and call back in 48 hours if not improved

## 2014-09-28 ENCOUNTER — Telehealth: Payer: Self-pay

## 2014-09-28 NOTE — Telephone Encounter (Signed)
Received a letter from [place] at [facility] requesting a copy of patient's current medications.Dr.Jordan's 09/23/14 office note with current medications faxed to RN Supervisor at fax # [phone].

## 2014-10-18 ENCOUNTER — Encounter: Payer: Self-pay | Admitting: Gastroenterology

## 2014-10-18 ENCOUNTER — Ambulatory Visit (INDEPENDENT_AMBULATORY_CARE_PROVIDER_SITE_OTHER): Payer: Medicare Other | Admitting: Gastroenterology

## 2014-10-18 ENCOUNTER — Ambulatory Visit: Payer: Medicare Other | Admitting: Gastroenterology

## 2014-10-18 VITALS — BP 100/70 | HR 64 | Ht 68.0 in | Wt 181.8 lb

## 2014-10-18 DIAGNOSIS — K5090 Crohn's disease, unspecified, without complications: Secondary | ICD-10-CM

## 2014-10-18 NOTE — Patient Instructions (Addendum)
You should be coming off of prednisone by 63m every week.  Until completley OFF of prednisone. You should taper of budesonide (uceris) by 339mper week until completely OFF of budesonide (uceris). Your next remicade dose (74m70mer kg) should be in 3 weeks and then every 8 weeks after that. Please return to see Dr. JacArdis Hughs 11/28/14 at 11 am. Stop taking pentasa today.

## 2014-10-18 NOTE — Progress Notes (Signed)
GI PROBLEM LIST:  1. Crohn's disease. Distant ileal and right colon resection by Dr. Gretta Cool in the very distant past. He was maintained on Pentasa, Entocort, and 529m of Purinethol for several years under the care of Dr. SLyla Son Hospitalization, May 2008, for acute myocardial infarction complicated by small bowel obstruction likely due to active Crohn's. Increased Purinethol to 100 mg daily and liver tests increased as well. Purinethol held and liver test normalized. The patient felt much better overall as well (less diarrhea, less fatigue). Winter, 2009: currently on 6 pills of Pentasa day feeling quite well overall. Summer, 2010 postoperative ileus following spine surgery (doubt active Crohn's flare). November, 2011: started cholestyramine with very good results (loose stools MUCH improved, only going 3-4 times a day). 03/2014 rov with increasing loose stools, urgency; started on uceris, levsin.   Repeat colonoscopy 4 2016 showed anastomotic ulceration, narrowing of the ileocecal anastomosis. Biopsies showed chronic and active inflammation. He was started on Remicade 5 mg/kg after TB testing and hepatitis testing were proven to be negative with very good response to the remicade. 2. Dysphagia February 2012: Barium esophagram showed mild narrowing at his GE junction. Was planning to perform dilation off Plavix (if okay with his cardiologist) when he had a bowel obstruction.  3. partial small bowel obstruction, February 2012. CT suggested transition point in the pelvis. Not clear if there was active Crohn's disease contributing to this, however he was put on prednisone in hosp; started 362ma day, taper off quickly. Recurrent obstruction 09/2012: CT scan IMPRESSION: Small bowel obstruction with apparent change in caliber within the anterior right lower quadrant - right upper pelvis most likely due to an adhesion. Was admitted to hosp, put on steroids empirically however seems more likely to have been  adhesive related than due to active IBD.   HPI: This is a  very pleasant [age]ear old man whom I last saw 2 or 3 months ago. He is here with his wife and his son.  Chief complaint is Crohn's disease  I recently spoke with Dr. RuVirgina Jockbout a month ago about the fact that Mr. BeMutchlerad fungal infection in his skin and was on strong anti-fungal medicines for it. Because of that he had to skip his third Remicade dose.  The Remicade has been significantly helping his symptoms. He is having no diarrhea at all and no abdominal pain.   all.     he is not exactly sure of his current medication doses but does believe he is still taking prednisone daily. He knows he is still on Uceris, budesonide as well as Pentasa.  Currently on prednisone 29m41mnce daily.   Past Medical History  Diagnosis Date  . Hypertension   . Hyperlipidemia   . Coronary artery disease     [facility]; Myoview 11/11 EF 71%, no scar, no ischemia;     cath 12/20/10:   pLAD 30-40%, stent ok, ? If stent oversized, EF 60% and low LVEDP  . GERD (gastroesophageal reflux disease)   . Other esophagitis   . Diaphragmatic hernia without mention of obstruction or gangrene   . Ulcerative (chronic) ileocolitis   . Diverticulosis of colon (without mention of hemorrhage)   . Other chronic nonalcoholic liver disease   . Regional enteritis of small intestine   . Flatulence, eructation, and gas pain   . Ischemic cardiomyopathy     EF initially 35-40% after MI 1/09;  echo 7/08: EF 60%  . Fatty liver     on ultrasound of 10/2009  . Parkinson's disease   . Gait disorder   . Memory disorder   . Osteoporosis   . Renal calculi   . Melanoma   . RBBB 09/02/2013  . HOH (hard of hearing)     Hearing aids  . Cellulitis     Past Surgical History  Procedure Laterality Date  . Cardiac catheterization  09/25/06    EF 35-40% but more recently 60%  . Cholecystectomy    .  Appendectomy    . Back surgery      L3, L4, L5  . Tonsillectomy    . Partial bowel resection    . Coronary artery stent placement    . Melanoma resection    . Cataract extraction      Bilateral    Current Outpatient Prescriptions  Medication Sig Dispense Refill  . acetaminophen (TYLENOL) 325 MG tablet Take 650 mg by mouth every 6 (six) hours as needed for pain.    Marland Kitchen ADVAIR DISKUS 250-50 MCG/DOSE AEPB Inhale 1 puff into the lungs daily.     . ANDROGEL PUMP 20.25 MG/ACT (1.62%) GEL Apply 1 application topically daily.     Marland Kitchen aspirin EC 81 MG tablet Take 81 mg by mouth daily.    Marland Kitchen atorvastatin (LIPITOR) 40 MG tablet Take 40 mg by mouth daily.      . Budesonide 9 MG TB24 Take 9 mg by mouth daily.     . calcium carbonate (CALCIUM 600) 600 MG TABS tablet Take 600 mg by mouth daily with breakfast.    . carbidopa-levodopa (SINEMET IR) 25-100 MG per tablet 1.5 tablets in the morning and evening, one tablet at midday and at dinnertime 450 tablet 1  . carvedilol (COREG) 6.25 MG tablet Take 6.25 mg by mouth 2 (two) times daily with a meal.    . Cholecalciferol (VITAMIN D-3) 1000 UNITS CAPS Take 1,000 Units by mouth daily.    . cholestyramine (QUESTRAN) 4 G packet TAKE 1 PACKET BY MOUTH TWICE DAILY WITH A MEAL 60 each 11  . isosorbide mononitrate (IMDUR) 30 MG 24 hr tablet TAKE ONE TABLET EACH DAY (Patient taking differently: TAKE 30MG BY MOUTH EACH DAY) 30 tablet 2  . itraconazole (SPORANOX) 100 MG capsule     . losartan (COZAAR) 25 MG tablet Take 25 mg by mouth daily.    . mesalamine (PENTASA) 250 MG CR capsule TAKE TWO CAPSULES THREE TIMES DAILY (Patient taking differently: Take 500 mg by mouth 3 (three) times daily. ) 180 capsule 11  . Multiple Vitamins-Minerals (CENTRUM SILVER PO) Take 1 tablet by mouth daily.    . mupirocin ointment (BACTROBAN) 2 % Apply 1 application topically daily as needed (skin irritation).     Marland Kitchen NITROSTAT 0.4 MG SL tablet AS DIRECTED 25 tablet 3  . pantoprazole  (PROTONIX) 40 MG tablet Take 1 tablet by mouth 2 (two) times daily.    . Potassium Bicarbonate 99 MG CAPS Take 99 mg by mouth daily.     . predniSONE (DELTASONE) 10 MG tablet Take 2 tablets (20 mg total) by mouth 2 (two) times daily with a meal. (Patient taking differently: Take 15 mg by mouth 2 (two) times daily with a meal. ) 100 tablet 3  . REMICADE 100 MG injection Inject 100 mg into the vein. Patient is not sure of frequency yet    . tamsulosin (FLOMAX) 0.4 MG CAPS capsule Take 0.4 mg  by mouth daily.     Current Facility-Administered Medications  Medication Dose Route Frequency Provider Last Rate Last Dose  . acetaminophen (TYLENOL) tablet 650 mg  650 mg Oral Once [name], MD      . diphenhydrAMINE (BENADRYL) capsule 50 mg  50 mg Oral Once [name], MD      . inFLIXimab (REMICADE) 5 mg/kg = 400 mg in sodium chloride 0.9 % 250 mL infusion  5 mg/kg Intravenous Once [name], MD        Allergies as of 10/18/2014  . (No Known Allergies)    Family History  Problem Relation Age of Onset  . Coronary artery disease    . Heart failure Mother   . Emphysema Father   . Heart disease Brother   . Heart disease Brother   . Colon cancer Brother     History   Social History  . Marital Status: Married    Spouse Name: N/A  . Number of Children: 3  . Years of Education: college   Occupational History  . CEO-retired   . CEO    Social History Main Topics  . Smoking status: Never Smoker   . Smokeless tobacco: Never Used  . Alcohol Use: Yes     Comment: occasional  . Drug Use: No  . Sexual Activity: Not on file   Other Topics Concern  . Not on file   Social History Narrative   Patient is right handed.   Patient drinks about 1 cup of caffeine daily.     Physical Exam: BP 100/70 mmHg  Pulse 64  Ht 5' 8"  (1.727 m)  Wt 181 lb 12.8 oz (82.464 kg)  BMI 27.65 kg/m2 Cushingoid face, frail appearing psyhiatric: alert and oriented x3 Abdomen: soft, nontender,  nondistended, no obvious ascites, no peritoneal signs, normal bowel sounds  2+ pitting edema bilaterally in his ankles   Assessment and plan: 81 y.o. male with Crohn's disease He has responded to Remicade however it had to be held due to fungal cellulitis on his arms. He tells me that a dermatologist believes his infection is definitely improving. I would like to get him back on his Remicade, next dose in about 3 weeks. And then every 8 weeks after that. He is not really clear on his current steroid-induced is 5 instructed his care facility to begin tapering his prednisone by 5 mg every week until it is completely off. They will taper his budesonide by 3 mg every week until it is completely off. He should completely stopped mesalamine for now. He will therefore be on Remicade monotherapy. He is going to call his primary care physician about the swelling in his lower extremities which may be somewhat helped by tapering his sterile. He will return to see me in 6-8 weeks and sooner if needed  Owens Loffler, MD [facility] 10/18/2014, 3:00 PM

## 2014-10-31 ENCOUNTER — Other Ambulatory Visit (HOSPITAL_COMMUNITY): Payer: Self-pay | Admitting: Internal Medicine

## 2014-11-10 ENCOUNTER — Emergency Department (HOSPITAL_COMMUNITY): Payer: Medicare Other

## 2014-11-10 ENCOUNTER — Encounter (HOSPITAL_COMMUNITY): Payer: Self-pay | Admitting: Emergency Medicine

## 2014-11-10 ENCOUNTER — Inpatient Hospital Stay (HOSPITAL_COMMUNITY)
Admission: EM | Admit: 2014-11-10 | Discharge: 2014-11-14 | DRG: 292 | Disposition: A | Discharge status: Skilled Nursing Facility | Payer: Medicare Other | Attending: Internal Medicine | Admitting: Internal Medicine

## 2014-11-10 ENCOUNTER — Telehealth: Payer: Self-pay | Admitting: Cardiology

## 2014-11-10 DIAGNOSIS — K5730 Diverticulosis of large intestine without perforation or abscess without bleeding: Secondary | ICD-10-CM | POA: Diagnosis present

## 2014-11-10 DIAGNOSIS — R0609 Other forms of dyspnea: Secondary | ICD-10-CM | POA: Diagnosis present

## 2014-11-10 DIAGNOSIS — K219 Gastro-esophageal reflux disease without esophagitis: Secondary | ICD-10-CM | POA: Diagnosis present

## 2014-11-10 DIAGNOSIS — I255 Ischemic cardiomyopathy: Secondary | ICD-10-CM | POA: Diagnosis present

## 2014-11-10 DIAGNOSIS — I252 Old myocardial infarction: Secondary | ICD-10-CM

## 2014-11-10 DIAGNOSIS — I5033 Acute on chronic diastolic (congestive) heart failure: Secondary | ICD-10-CM | POA: Diagnosis not present

## 2014-11-10 DIAGNOSIS — Z955 Presence of coronary angioplasty implant and graft: Secondary | ICD-10-CM

## 2014-11-10 DIAGNOSIS — J449 Chronic obstructive pulmonary disease, unspecified: Secondary | ICD-10-CM | POA: Diagnosis present

## 2014-11-10 DIAGNOSIS — L0390 Cellulitis, unspecified: Secondary | ICD-10-CM | POA: Diagnosis present

## 2014-11-10 DIAGNOSIS — I4510 Unspecified right bundle-branch block: Secondary | ICD-10-CM | POA: Diagnosis present

## 2014-11-10 DIAGNOSIS — R42 Dizziness and giddiness: Secondary | ICD-10-CM | POA: Diagnosis present

## 2014-11-10 DIAGNOSIS — K5090 Crohn's disease, unspecified, without complications: Secondary | ICD-10-CM | POA: Diagnosis present

## 2014-11-10 DIAGNOSIS — I951 Orthostatic hypotension: Secondary | ICD-10-CM | POA: Diagnosis present

## 2014-11-10 DIAGNOSIS — L03119 Cellulitis of unspecified part of limb: Secondary | ICD-10-CM

## 2014-11-10 DIAGNOSIS — R0602 Shortness of breath: Secondary | ICD-10-CM

## 2014-11-10 DIAGNOSIS — M810 Age-related osteoporosis without current pathological fracture: Secondary | ICD-10-CM | POA: Diagnosis present

## 2014-11-10 DIAGNOSIS — E785 Hyperlipidemia, unspecified: Secondary | ICD-10-CM | POA: Diagnosis present

## 2014-11-10 DIAGNOSIS — R609 Edema, unspecified: Secondary | ICD-10-CM | POA: Insufficient documentation

## 2014-11-10 DIAGNOSIS — I5031 Acute diastolic (congestive) heart failure: Secondary | ICD-10-CM | POA: Diagnosis present

## 2014-11-10 DIAGNOSIS — I2510 Atherosclerotic heart disease of native coronary artery without angina pectoris: Secondary | ICD-10-CM | POA: Diagnosis present

## 2014-11-10 DIAGNOSIS — I10 Essential (primary) hypertension: Secondary | ICD-10-CM | POA: Diagnosis present

## 2014-11-10 DIAGNOSIS — Z66 Do not resuscitate: Secondary | ICD-10-CM | POA: Diagnosis present

## 2014-11-10 DIAGNOSIS — Z9049 Acquired absence of other specified parts of digestive tract: Secondary | ICD-10-CM | POA: Diagnosis present

## 2014-11-10 DIAGNOSIS — L8990 Pressure ulcer of unspecified site, unspecified stage: Secondary | ICD-10-CM | POA: Insufficient documentation

## 2014-11-10 DIAGNOSIS — Z7982 Long term (current) use of aspirin: Secondary | ICD-10-CM

## 2014-11-10 DIAGNOSIS — K760 Fatty (change of) liver, not elsewhere classified: Secondary | ICD-10-CM | POA: Diagnosis present

## 2014-11-10 DIAGNOSIS — K769 Liver disease, unspecified: Secondary | ICD-10-CM | POA: Diagnosis present

## 2014-11-10 DIAGNOSIS — Z8249 Family history of ischemic heart disease and other diseases of the circulatory system: Secondary | ICD-10-CM

## 2014-11-10 DIAGNOSIS — R269 Unspecified abnormalities of gait and mobility: Secondary | ICD-10-CM | POA: Diagnosis present

## 2014-11-10 DIAGNOSIS — G20 Parkinson's disease: Secondary | ICD-10-CM | POA: Diagnosis present

## 2014-11-10 DIAGNOSIS — Z79899 Other long term (current) drug therapy: Secondary | ICD-10-CM

## 2014-11-10 DIAGNOSIS — Z9111 Patient's noncompliance with dietary regimen: Secondary | ICD-10-CM | POA: Diagnosis present

## 2014-11-10 DIAGNOSIS — H9190 Unspecified hearing loss, unspecified ear: Secondary | ICD-10-CM | POA: Diagnosis present

## 2014-11-10 DIAGNOSIS — E860 Dehydration: Secondary | ICD-10-CM | POA: Diagnosis present

## 2014-11-10 DIAGNOSIS — Z85820 Personal history of malignant melanoma of skin: Secondary | ICD-10-CM

## 2014-11-10 LAB — APTT: aPTT: 30 seconds (ref 24–37)

## 2014-11-10 LAB — BASIC METABOLIC PANEL
Anion gap: 8 (ref 5–15)
BUN: 11 mg/dL (ref 6–20)
CO2: 27 mmol/L (ref 22–32)
Calcium: 8.6 mg/dL — ABNORMAL LOW (ref 8.9–10.3)
Chloride: 104 mmol/L (ref 101–111)
Creatinine, Ser: 1.16 mg/dL (ref 0.61–1.24)
GFR calc Af Amer: >60 mL/min (ref >60)
GFR calc non Af Amer: 56 mL/min — ABNORMAL LOW (ref >60)
Glucose, Bld: 109 mg/dL — ABNORMAL HIGH (ref 65–99)
Potassium: 4.6 mmol/L (ref 3.5–5.1)
Sodium: 139 mmol/L (ref 135–145)

## 2014-11-10 LAB — CBC WITH DIFFERENTIAL/PLATELET
Basophils Absolute: 0 10*3/uL (ref 0.0–0.1)
Basophils Relative: 1 % (ref 0–1)
Eosinophils Absolute: 0 10*3/uL (ref 0.0–0.7)
Eosinophils Relative: 1 % (ref 0–5)
HCT: 41.2 % (ref 39.0–52.0)
Hemoglobin: 13.9 g/dL (ref 13.0–17.0)
Lymphocytes Relative: 36 % (ref 12–46)
Lymphs Abs: 2.2 10*3/uL (ref 0.7–4.0)
MCH: 34.2 pg — ABNORMAL HIGH (ref 26.0–34.0)
MCHC: 33.7 g/dL (ref 30.0–36.0)
MCV: 101.2 fL — ABNORMAL HIGH (ref 78.0–100.0)
Monocytes Absolute: 0.6 10*3/uL (ref 0.1–1.0)
Monocytes Relative: 10 % (ref 3–12)
Neutro Abs: 3.1 10*3/uL (ref 1.7–7.7)
Neutrophils Relative %: 52 % (ref 43–77)
Platelets: 208 10*3/uL (ref 150–400)
RBC: 4.07 MIL/uL — ABNORMAL LOW (ref 4.22–5.81)
RDW: 14.7 % (ref 11.5–15.5)
WBC: 5.9 10*3/uL (ref 4.0–10.5)

## 2014-11-10 LAB — I-STAT TROPONIN, ED: Troponin i, poc: 0.01 ng/mL (ref 0.00–0.08)

## 2014-11-10 LAB — PROTIME-INR
INR: 1.03 (ref 0.00–1.49)
Prothrombin Time: 13.7 seconds (ref 11.6–15.2)

## 2014-11-10 LAB — BRAIN NATRIURETIC PEPTIDE: B Natriuretic Peptide: 160.2 pg/mL — ABNORMAL HIGH (ref 0.0–100.0)

## 2014-11-10 MED ORDER — FUROSEMIDE 10 MG/ML IJ SOLN
40.0000 mg | Freq: Once | INTRAMUSCULAR | Status: AC
  Administered 2014-11-11: 40 mg via INTRAVENOUS
  Filled 2014-11-10: qty 4

## 2014-11-10 MED ORDER — VANCOMYCIN HCL IN DEXTROSE 1-5 GM/200ML-% IV SOLN
1000.0000 mg | Freq: Once | INTRAVENOUS | Status: DC
  Administered 2014-11-11: 1000 mg via INTRAVENOUS
  Filled 2014-11-10: qty 200

## 2014-11-10 MED ORDER — ALBUTEROL SULFATE (2.5 MG/3ML) 0.083% IN NEBU
5.0000 mg | INHALATION_SOLUTION | Freq: Once | RESPIRATORY_TRACT | Status: DC
  Filled 2014-11-10: qty 6

## 2014-11-10 MED ORDER — IPRATROPIUM BROMIDE 0.02 % IN SOLN
0.5000 mg | Freq: Once | RESPIRATORY_TRACT | Status: DC
  Filled 2014-11-10: qty 2.5

## 2014-11-10 MED ORDER — IOHEXOL 350 MG/ML SOLN
100.0000 mL | Freq: Once | INTRAVENOUS | Status: AC | PRN
  Administered 2014-11-10: 80 mL via INTRAVENOUS

## 2014-11-10 NOTE — ED Provider Notes (Signed)
CSN: [id number]     Arrival date & time 11/10/14  1821 History   First MD Initiated Contact with Patient 11/10/14 1827     Chief Complaint  Patient presents with  . Weakness  . Dizziness     (Consider location/radiation/quality/duration/timing/severity/associated sxs/prior Treatment) HPI Comments: Patient with a history of Cellulitis, Parkinson's, CAD, STEMI, HTN, and Hyperlipidemia presents today with a chief complaint of dizziness.  He reports that he was at the Ophthalmologist today and was found to have a blood pressure that was fluctuating.  He states that he has had dizziness all week, which he describes as feeling lightheaded.  He states that the dizziness is worse with standing.  No recent changes to medications.  He reports that he then returned to [facility] where he resides and his blood pressure was continuing to fluctuate.  He states that they also found to him be tachycardic and have a "heart arrhythmia."  He denies any history of Atrial Fibrillation or heart arrhythmia.  He is currently on 81 mg ASA daily, but no other anticoagulants.  He reports associated LE bilateral edema.  He currently takes 40 mg Lasix, which he reports was recently increased.  He also reports erythema of the LLE for the past week.  He denies chest pain, SOB, nausea, vomiting, abdominal pain, vision changes, headache, focal weakness, numbness, or tingling.    Patient is a 81 y.o. male presenting with weakness and dizziness. The history is provided by the patient.  Weakness Associated symptoms include weakness.  Dizziness Associated symptoms: weakness     Past Medical History  Diagnosis Date  . Hypertension   . Hyperlipidemia   . Coronary artery disease     s/p ANTERIOR STEMI 1/09 at [facility]; treated with a bare-metal stent to the LAD; Myoview 11/11 EF 71%, no scar, no ischemia;     cath 12/20/10:   pLAD 30-40%, stent ok, ? If stent oversized, EF 60% and low LVEDP  . GERD (gastroesophageal reflux disease)    . Other esophagitis   . Diaphragmatic hernia without mention of obstruction or gangrene   . Ulcerative (chronic) ileocolitis   . Diverticulosis of colon (without mention of hemorrhage)   . Other chronic nonalcoholic liver disease   . Regional enteritis of small intestine   . Flatulence, eructation, and gas pain   . Ischemic cardiomyopathy     EF initially 35-40% after MI 1/09; echo 7/08: EF 60%  . Fatty liver     on ultrasound of 10/2009  . Parkinson's disease   . Gait disorder   . Memory disorder   . Osteoporosis   . Renal calculi   . Melanoma   . RBBB 09/02/2013  . HOH (hard of hearing)     Hearing aids  . Cellulitis    Past Surgical History  Procedure Laterality Date  . Cardiac catheterization  09/25/06    EF 35-40% but more recently 60%  . Cholecystectomy    . Appendectomy    . Back surgery      L3, L4, L5  . Tonsillectomy    . Partial bowel resection    . Coronary artery stent placement    . Melanoma resection    . Cataract extraction      Bilateral   Family History  Problem Relation Age of Onset  . Coronary artery disease    . Heart failure Mother   . Emphysema Father   . Heart disease Brother   . Heart disease Brother   .  Colon cancer Brother    History  Substance Use Topics  . Smoking status: Never Smoker   . Smokeless tobacco: Never Used  . Alcohol Use: Yes     Comment: occasional    Review of Systems  Neurological: Positive for dizziness and weakness.  All other systems reviewed and are negative.     Allergies  Review of patient's allergies indicates no known allergies.  Home Medications   Prior to Admission medications   Medication Sig Start Date End Date Taking? Authorizing Provider  acetaminophen (TYLENOL) 325 MG tablet Take 650 mg by mouth every 6 (six) hours as needed for pain.    Historical Provider, MD  ADVAIR DISKUS 250-50 MCG/DOSE AEPB Inhale 1 puff into the lungs daily.  01/24/14   Historical Provider, MD  ANDROGEL PUMP 20.25  MG/ACT (1.62%) GEL Apply 1 application topically daily.  03/21/14   Historical Provider, MD  aspirin EC 81 MG tablet Take 81 mg by mouth daily.    Historical Provider, MD  atorvastatin (LIPITOR) 40 MG tablet Take 40 mg by mouth daily.      Historical Provider, MD  Budesonide 9 MG TB24 Take 9 mg by mouth daily.     Historical Provider, MD  calcium carbonate (CALCIUM 600) 600 MG TABS tablet Take 600 mg by mouth daily with breakfast.    Historical Provider, MD  carbidopa-levodopa (SINEMET IR) 25-100 MG per tablet 1.5 tablets in the morning and evening, one tablet at midday and at dinnertime 08/17/14   [name], MD  carvedilol (COREG) 6.25 MG tablet Take 6.25 mg by mouth 2 (two) times daily with a meal.    Historical Provider, MD  Cholecalciferol (VITAMIN D-3) 1000 UNITS CAPS Take 1,000 Units by mouth daily.    Historical Provider, MD  cholestyramine Lucrezia Starch) 4 G packet TAKE 1 PACKET BY MOUTH TWICE DAILY WITH A MEAL 11/15/13   [name], MD  isosorbide mononitrate (IMDUR) 30 MG 24 hr tablet TAKE ONE TABLET EACH DAY 10/31/14   Jolaine Artist, MD  itraconazole (SPORANOX) 100 MG capsule  09/30/14   Historical Provider, MD  losartan (COZAAR) 25 MG tablet Take 25 mg by mouth daily. 12/06/13   Historical Provider, MD  Multiple Vitamins-Minerals (CENTRUM SILVER PO) Take 1 tablet by mouth daily.    Historical Provider, MD  mupirocin ointment (BACTROBAN) 2 % Apply 1 application topically daily as needed (skin irritation).  09/02/14   Historical Provider, MD  NITROSTAT 0.4 MG SL tablet AS DIRECTED 08/23/14   Jolaine Artist, MD  pantoprazole (PROTONIX) 40 MG tablet Take 1 tablet by mouth 2 (two) times daily. 09/14/14   Historical Provider, MD  Potassium Bicarbonate 99 MG CAPS Take 99 mg by mouth daily.     Historical Provider, MD  predniSONE (DELTASONE) 10 MG tablet Take 2 tablets (20 mg total) by mouth 2 (two) times daily with a meal. Patient taking differently: Take 15 mg by mouth 2 (two) times  daily with a meal.  09/07/14   [name], MD  REMICADE 100 MG injection Inject 100 mg into the vein. Patient is not sure of frequency yet 08/09/14   Historical Provider, MD  tamsulosin (FLOMAX) 0.4 MG CAPS capsule Take 0.4 mg by mouth daily. 07/25/14   Historical Provider, MD   BP 141/78 mmHg  Pulse 102  Temp(Src) 97.8 F (36.6 C) (Oral)  Resp 22  Ht 5' 9"  (1.753 m)  Wt 174 lb (78.926 kg)  BMI 25.68 kg/m2  SpO2  98% Physical Exam  Constitutional: He appears well-developed and well-nourished.  HENT:  Head: Normocephalic and atraumatic.  Neck: Normal range of motion. Neck supple.  Cardiovascular: Normal rate, regular rhythm and normal heart sounds.   Pulmonary/Chest: Effort normal. No respiratory distress. He has decreased breath sounds. He has wheezes. He has no rales.  Musculoskeletal: Normal range of motion.  Pitting edema of lower extremities bilaterally, worse on the left.  Erythema and warmth of the LLE  Neurological: He is alert. He has normal strength. No cranial nerve deficit or sensory deficit.  Skin: Skin is warm and dry.  Psychiatric: He has a normal mood and affect.  Nursing note and vitals reviewed.   ED Course  Procedures (including critical care time) Labs Review Labs Reviewed - No data to display  Imaging Review Dg Chest 2 View  11/10/2014   CLINICAL DATA:  Weakness, hypotension, syncope  EXAM: CHEST  2 VIEW  COMPARISON:  09/05/2014  FINDINGS: The heart size is mildly enlarged, stable. Both lungs are clear. The visualized skeletal structures are unremarkable.  IMPRESSION: No active cardiopulmonary disease.   Electronically Signed   By: Conchita Paris M.D.   On: 11/10/2014 19:55   Ct Angio Chest Pe W/cm &/or Wo Cm  11/10/2014   CLINICAL DATA:  Weakness lightheadedness and hypotension.  EXAM: CT ANGIOGRAPHY CHEST WITH CONTRAST  TECHNIQUE: Multidetector CT imaging of the chest was performed using the standard protocol during bolus administration of intravenous  contrast. Multiplanar CT image reconstructions and MIPs were obtained to evaluate the vascular anatomy.  CONTRAST:  62m OMNIPAQUE IOHEXOL 350 MG/ML SOLN  COMPARISON:  09/05/2014  FINDINGS: Cardiovascular: There is good opacification of the pulmonary arteries. There is no pulmonary embolism. The thoracic aorta is normal in caliber and intact.  Lungs: Clear except for minimal atelectatic appearing peripheral basilar opacity on the left, reduced from 09/05/2014.  Central airways: Patent  Effusions: None  Lymphadenopathy: None  Esophagus: Unremarkable  Upper abdomen: No significant abnormality  Musculoskeletal: Moderate degenerative disc changes. No significant abnormality.  Review of the MIP images confirms the above findings.  IMPRESSION: Negative for acute pulmonary embolism or other acute abnormality in the chest. Minimal atelectatic appearing peripheral basilar opacities on the left.   Electronically Signed   By: DAndreas NewportM.D.   On: 11/10/2014 22:42     EKG Interpretation   Date/Time:  Thursday November 10 2014 19:21:50 EDT Ventricular Rate:  97 PR Interval:  213 QRS Duration: 122 QT Interval:  362 QTC Calculation: 460 R Axis:   71 Text Interpretation:  Sinus tachycardia Supraventricular bigeminy  Borderline prolonged PR interval Right bundle branch block No significant  change was found Confirmed by RWyvonnia Dusky MD, STEPHEN [phone] on 11/10/2014  7:31:11 PM      MDM   Final diagnoses:  None   Patient presents today with complaint of dizziness, which he describes as lightheadedness.  VSS in the ED.  No focal findings on neurological exam.  Patient with pitting edema of LE bilaterally.  He is currently on 40 mg Lasix.  Edema of the LLE is worse also with erythema and warmth.  Doppler ultrasound ordered.  Will treat for Cellulitis.  CXR is negative.  No ischemic changes on EKG.  Labs pending at shift change.  Jen Piepenbrink, PA-C will follow up on labs.  Plan is for the patient to get a CT  angio chest if the Creatine is WNL.      HHyman Bible PA-C 11/12/14 1627  Ezequiel Essex, MD 11/13/14 [phone]

## 2014-11-10 NOTE — ED Provider Notes (Signed)
Patient care acquired from [facility], PA-C.  Results for orders placed or performed during the hospital encounter of 11/10/14  CBC with Differential/Platelet  Result Value Ref Range   WBC 5.9 4.0 - 10.5 K/uL   RBC 4.07 (L) 4.22 - 5.81 MIL/uL   Hemoglobin 13.9 13.0 - 17.0 g/dL   HCT 41.2 39.0 - 52.0 %   MCV 101.2 (H) 78.0 - 100.0 fL   MCH 34.2 (H) 26.0 - 34.0 pg   MCHC 33.7 30.0 - 36.0 g/dL   RDW 14.7 11.5 - 15.5 %   Platelets 208 150 - 400 K/uL   Neutrophils Relative % 52 43 - 77 %   Neutro Abs 3.1 1.7 - 7.7 K/uL   Lymphocytes Relative 36 12 - 46 %   Lymphs Abs 2.2 0.7 - 4.0 K/uL   Monocytes Relative 10 3 - 12 %   Monocytes Absolute 0.6 0.1 - 1.0 K/uL   Eosinophils Relative 1 0 - 5 %   Eosinophils Absolute 0.0 0.0 - 0.7 K/uL   Basophils Relative 1 0 - 1 %   Basophils Absolute 0.0 0.0 - 0.1 K/uL  Basic metabolic panel  Result Value Ref Range   Sodium 139 135 - 145 mmol/L   Potassium 4.6 3.5 - 5.1 mmol/L   Chloride 104 101 - 111 mmol/L   CO2 27 22 - 32 mmol/L   Glucose, Bld 109 (H) 65 - 99 mg/dL   BUN 11 6 - 20 mg/dL   Creatinine, Ser 1.16 0.61 - 1.24 mg/dL   Calcium 8.6 (L) 8.9 - 10.3 mg/dL   GFR calc non Af Amer 56 (L) >60 mL/min   GFR calc Af Amer >60 >60 mL/min   Anion gap 8 5 - 15  Protime-INR  Result Value Ref Range   Prothrombin Time 13.7 11.6 - 15.2 seconds   INR 1.03 0.00 - 1.49  APTT  Result Value Ref Range   aPTT 30 24 - 37 seconds  Brain natriuretic peptide  Result Value Ref Range   B Natriuretic Peptide 160.2 (H) 0.0 - 100.0 pg/mL  I-stat troponin, ED  Result Value Ref Range   Troponin i, poc 0.01 0.00 - 0.08 ng/mL   Comment 3           Dg Chest 2 View  11/10/2014   CLINICAL DATA:  Weakness, hypotension, syncope  EXAM: CHEST  2 VIEW  COMPARISON:  09/05/2014  FINDINGS: The heart size is mildly enlarged, stable. Both lungs are clear. The visualized skeletal structures are unremarkable.  IMPRESSION: No active cardiopulmonary disease.   Electronically  Signed   By: Conchita Paris M.D.   On: 11/10/2014 19:55   Ct Angio Chest Pe W/cm &/or Wo Cm  11/10/2014   CLINICAL DATA:  Weakness lightheadedness and hypotension.  EXAM: CT ANGIOGRAPHY CHEST WITH CONTRAST  TECHNIQUE: Multidetector CT imaging of the chest was performed using the standard protocol during bolus administration of intravenous contrast. Multiplanar CT image reconstructions and MIPs were obtained to evaluate the vascular anatomy.  CONTRAST:  64m OMNIPAQUE IOHEXOL 350 MG/ML SOLN  COMPARISON:  09/05/2014  FINDINGS: Cardiovascular: There is good opacification of the pulmonary arteries. There is no pulmonary embolism. The thoracic aorta is normal in caliber and intact.  Lungs: Clear except for minimal atelectatic appearing peripheral basilar opacity on the left, reduced from 09/05/2014.  Central airways: Patent  Effusions: None  Lymphadenopathy: None  Esophagus: Unremarkable  Upper abdomen: No significant abnormality  Musculoskeletal: Moderate degenerative disc changes. No significant  abnormality.  Review of the MIP images confirms the above findings.  IMPRESSION: Negative for acute pulmonary embolism or other acute abnormality in the chest. Minimal atelectatic appearing peripheral basilar opacities on the left.   Electronically Signed   By: Andreas Newport M.D.   On: 11/10/2014 22:42    Patient still feeling lightheaded, will hold on ambulating.   Patient has been 5-10lbs up from dry weight, recently increased to 24m Lasix daily for peripheral edema. Given bilateral LE (L > R) pitting edema as well as elevated BNP will dose with IV lasix.  L lower extremity warmth, erythema and swelling > R lower extremity. Patient has been watched by Dr. RVirgina Jockover the last several weeks for this per family with no improvement of erythema. Concern for cellulitis will start on IV Abx.   1. Cellulitis of lower extremity, unspecified laterality   2. SOB (shortness of breath)   3. Dizziness   4. Peripheral  edema    Will admit to the hospitalist for further management and evaluation. Patient d/w with Dr. RWyvonnia Dusky agrees with plan.    JBaron Sane PA-C 11/11/14 0009  SEzequiel Essex MD 11/11/14 [phone]

## 2014-11-10 NOTE — ED Notes (Signed)
Pt is a resident at [facility]. He arrived by [facility] from eye doctor with c/o weakness, low bp and lightheadedness. Md office stated that pt BP has been low but with EMS pt BP 012J systolic the whole time. Stated that it was hard to hear manual BP d/t pt having afib. Pt in afib with hx of same.

## 2014-11-10 NOTE — Telephone Encounter (Signed)
[name] is calling because [name] is in their office now and he got really light headed and his heart rate is btween 94-102 , blood pressure is at 126/74. . They want to know what do you want them to do with him while he is in their office . Thanks

## 2014-11-11 ENCOUNTER — Telehealth: Payer: Self-pay | Admitting: Neurology

## 2014-11-11 ENCOUNTER — Observation Stay (HOSPITAL_COMMUNITY): Payer: Medicare Other

## 2014-11-11 ENCOUNTER — Encounter (HOSPITAL_COMMUNITY): Payer: Self-pay | Admitting: Internal Medicine

## 2014-11-11 ENCOUNTER — Ambulatory Visit: Payer: Medicare Other | Admitting: Cardiovascular Disease

## 2014-11-11 DIAGNOSIS — I951 Orthostatic hypotension: Secondary | ICD-10-CM | POA: Diagnosis present

## 2014-11-11 DIAGNOSIS — K769 Liver disease, unspecified: Secondary | ICD-10-CM | POA: Diagnosis present

## 2014-11-11 DIAGNOSIS — G20 Parkinson's disease: Secondary | ICD-10-CM | POA: Diagnosis present

## 2014-11-11 DIAGNOSIS — I509 Heart failure, unspecified: Secondary | ICD-10-CM | POA: Diagnosis not present

## 2014-11-11 DIAGNOSIS — I5031 Acute diastolic (congestive) heart failure: Secondary | ICD-10-CM | POA: Diagnosis present

## 2014-11-11 DIAGNOSIS — Z79899 Other long term (current) drug therapy: Secondary | ICD-10-CM | POA: Diagnosis not present

## 2014-11-11 DIAGNOSIS — E860 Dehydration: Secondary | ICD-10-CM | POA: Diagnosis present

## 2014-11-11 DIAGNOSIS — R269 Unspecified abnormalities of gait and mobility: Secondary | ICD-10-CM | POA: Diagnosis present

## 2014-11-11 DIAGNOSIS — Z8249 Family history of ischemic heart disease and other diseases of the circulatory system: Secondary | ICD-10-CM | POA: Diagnosis not present

## 2014-11-11 DIAGNOSIS — J449 Chronic obstructive pulmonary disease, unspecified: Secondary | ICD-10-CM | POA: Diagnosis present

## 2014-11-11 DIAGNOSIS — K5090 Crohn's disease, unspecified, without complications: Secondary | ICD-10-CM | POA: Diagnosis present

## 2014-11-11 DIAGNOSIS — I255 Ischemic cardiomyopathy: Secondary | ICD-10-CM | POA: Diagnosis present

## 2014-11-11 DIAGNOSIS — K219 Gastro-esophageal reflux disease without esophagitis: Secondary | ICD-10-CM | POA: Diagnosis present

## 2014-11-11 DIAGNOSIS — Z85820 Personal history of malignant melanoma of skin: Secondary | ICD-10-CM | POA: Diagnosis not present

## 2014-11-11 DIAGNOSIS — L03119 Cellulitis of unspecified part of limb: Secondary | ICD-10-CM

## 2014-11-11 DIAGNOSIS — R609 Edema, unspecified: Secondary | ICD-10-CM | POA: Diagnosis not present

## 2014-11-11 DIAGNOSIS — R0609 Other forms of dyspnea: Secondary | ICD-10-CM | POA: Diagnosis present

## 2014-11-11 DIAGNOSIS — R42 Dizziness and giddiness: Secondary | ICD-10-CM | POA: Diagnosis present

## 2014-11-11 DIAGNOSIS — I2510 Atherosclerotic heart disease of native coronary artery without angina pectoris: Secondary | ICD-10-CM | POA: Diagnosis present

## 2014-11-11 DIAGNOSIS — I5033 Acute on chronic diastolic (congestive) heart failure: Secondary | ICD-10-CM | POA: Diagnosis present

## 2014-11-11 DIAGNOSIS — L8990 Pressure ulcer of unspecified site, unspecified stage: Secondary | ICD-10-CM | POA: Insufficient documentation

## 2014-11-11 DIAGNOSIS — Z955 Presence of coronary angioplasty implant and graft: Secondary | ICD-10-CM | POA: Diagnosis not present

## 2014-11-11 DIAGNOSIS — E785 Hyperlipidemia, unspecified: Secondary | ICD-10-CM | POA: Diagnosis present

## 2014-11-11 DIAGNOSIS — I10 Essential (primary) hypertension: Secondary | ICD-10-CM | POA: Diagnosis present

## 2014-11-11 DIAGNOSIS — Z66 Do not resuscitate: Secondary | ICD-10-CM | POA: Diagnosis present

## 2014-11-11 DIAGNOSIS — Z7982 Long term (current) use of aspirin: Secondary | ICD-10-CM | POA: Diagnosis not present

## 2014-11-11 DIAGNOSIS — I4510 Unspecified right bundle-branch block: Secondary | ICD-10-CM | POA: Diagnosis present

## 2014-11-11 DIAGNOSIS — H9190 Unspecified hearing loss, unspecified ear: Secondary | ICD-10-CM | POA: Diagnosis present

## 2014-11-11 DIAGNOSIS — I252 Old myocardial infarction: Secondary | ICD-10-CM | POA: Diagnosis not present

## 2014-11-11 DIAGNOSIS — Z9111 Patient's noncompliance with dietary regimen: Secondary | ICD-10-CM | POA: Diagnosis present

## 2014-11-11 DIAGNOSIS — K760 Fatty (change of) liver, not elsewhere classified: Secondary | ICD-10-CM | POA: Diagnosis present

## 2014-11-11 DIAGNOSIS — K5730 Diverticulosis of large intestine without perforation or abscess without bleeding: Secondary | ICD-10-CM | POA: Diagnosis present

## 2014-11-11 DIAGNOSIS — Z9049 Acquired absence of other specified parts of digestive tract: Secondary | ICD-10-CM | POA: Diagnosis present

## 2014-11-11 DIAGNOSIS — M810 Age-related osteoporosis without current pathological fracture: Secondary | ICD-10-CM | POA: Diagnosis present

## 2014-11-11 LAB — BASIC METABOLIC PANEL
Anion gap: 12 (ref 5–15)
BUN: 8 mg/dL (ref 6–20)
CO2: 28 mmol/L (ref 22–32)
Calcium: 8.2 mg/dL — ABNORMAL LOW (ref 8.9–10.3)
Chloride: 98 mmol/L — ABNORMAL LOW (ref 101–111)
Creatinine, Ser: 1.09 mg/dL (ref 0.61–1.24)
GFR calc Af Amer: >60 mL/min (ref >60)
GFR calc non Af Amer: >60 mL/min (ref >60)
Glucose, Bld: 107 mg/dL — ABNORMAL HIGH (ref 65–99)
Potassium: 3.3 mmol/L — ABNORMAL LOW (ref 3.5–5.1)
Sodium: 138 mmol/L (ref 135–145)

## 2014-11-11 LAB — URINALYSIS, ROUTINE W REFLEX MICROSCOPIC
Bilirubin Urine: NEGATIVE
Glucose, UA: NEGATIVE mg/dL
Hgb urine dipstick: NEGATIVE
Ketones, ur: NEGATIVE mg/dL
Leukocytes, UA: NEGATIVE
Nitrite: NEGATIVE
Protein, ur: NEGATIVE mg/dL
Specific Gravity, Urine: 1.009 (ref 1.005–1.030)
Urobilinogen, UA: 0.2 mg/dL (ref 0.0–1.0)
pH: 5.5 (ref 5.0–8.0)

## 2014-11-11 LAB — TROPONIN I
Troponin I: <0.03 ng/mL (ref <0.031)
Troponin I: <0.03 ng/mL (ref <0.031)
Troponin I: <0.03 ng/mL (ref <0.031)

## 2014-11-11 LAB — MRSA PCR SCREENING: MRSA by PCR: NEGATIVE

## 2014-11-11 MED ORDER — SODIUM CHLORIDE 0.9 % IV SOLN
INTRAVENOUS | Status: DC
  Administered 2014-11-11 – 2014-11-12 (×2): via INTRAVENOUS

## 2014-11-11 MED ORDER — CARVEDILOL 6.25 MG PO TABS
6.2500 mg | ORAL_TABLET | Freq: Two times a day (BID) | ORAL | Status: DC
  Administered 2014-11-11 – 2014-11-14 (×7): 6.25 mg via ORAL
  Filled 2014-11-11 (×9): qty 1

## 2014-11-11 MED ORDER — TAMSULOSIN HCL 0.4 MG PO CAPS
0.4000 mg | ORAL_CAPSULE | Freq: Every day | ORAL | Status: DC
Start: 2014-11-11 — End: 2014-11-12
  Administered 2014-11-11 – 2014-11-12 (×2): 0.4 mg via ORAL
  Filled 2014-11-11 (×2): qty 1

## 2014-11-11 MED ORDER — SODIUM CHLORIDE 0.9 % IJ SOLN
3.0000 mL | Freq: Two times a day (BID) | INTRAMUSCULAR | Status: DC
  Administered 2014-11-11 – 2014-11-14 (×5): 3 mL via INTRAVENOUS

## 2014-11-11 MED ORDER — ACETAMINOPHEN 325 MG PO TABS
650.0000 mg | ORAL_TABLET | Freq: Four times a day (QID) | ORAL | Status: DC | PRN
  Administered 2014-11-11 – 2014-11-13 (×4): 650 mg via ORAL
  Filled 2014-11-11 (×4): qty 2

## 2014-11-11 MED ORDER — VITAMIN D3 25 MCG (1000 UNIT) PO TABS
1000.0000 [IU] | ORAL_TABLET | Freq: Every day | ORAL | Status: DC
  Administered 2014-11-11 – 2014-11-14 (×4): 1000 [IU] via ORAL
  Filled 2014-11-11 (×4): qty 1

## 2014-11-11 MED ORDER — ASPIRIN EC 81 MG PO TBEC
81.0000 mg | DELAYED_RELEASE_TABLET | Freq: Every day | ORAL | Status: DC
  Administered 2014-11-11 – 2014-11-14 (×4): 81 mg via ORAL
  Filled 2014-11-11 (×4): qty 1

## 2014-11-11 MED ORDER — NITROGLYCERIN 0.4 MG SL SUBL: 0.4000 mg | SUBLINGUAL_TABLET | SUBLINGUAL | Status: DC | PRN

## 2014-11-11 MED ORDER — ALBUTEROL SULFATE (2.5 MG/3ML) 0.083% IN NEBU: 2.5000 mg | INHALATION_SOLUTION | RESPIRATORY_TRACT | Status: DC | PRN

## 2014-11-11 MED ORDER — ISOSORBIDE MONONITRATE ER 30 MG PO TB24
30.0000 mg | ORAL_TABLET | Freq: Every day | ORAL | Status: DC
  Administered 2014-11-11 – 2014-11-14 (×4): 30 mg via ORAL
  Filled 2014-11-11 (×4): qty 1

## 2014-11-11 MED ORDER — PANTOPRAZOLE SODIUM 40 MG PO TBEC
40.0000 mg | DELAYED_RELEASE_TABLET | Freq: Two times a day (BID) | ORAL | Status: DC
  Administered 2014-11-11 – 2014-11-14 (×7): 40 mg via ORAL
  Filled 2014-11-11 (×6): qty 1

## 2014-11-11 MED ORDER — POTASSIUM CHLORIDE CRYS ER 20 MEQ PO TBCR
20.0000 meq | EXTENDED_RELEASE_TABLET | Freq: Every day | ORAL | Status: DC
  Administered 2014-11-12 – 2014-11-13 (×2): 20 meq via ORAL
  Filled 2014-11-11 (×3): qty 1

## 2014-11-11 MED ORDER — FUROSEMIDE 10 MG/ML IJ SOLN
40.0000 mg | Freq: Two times a day (BID) | INTRAMUSCULAR | Status: DC
  Administered 2014-11-11: 40 mg via INTRAVENOUS
  Filled 2014-11-11 (×3): qty 4

## 2014-11-11 MED ORDER — CHOLESTYRAMINE 4 G PO PACK
4.0000 g | PACK | Freq: Two times a day (BID) | ORAL | Status: DC
  Administered 2014-11-11 – 2014-11-14 (×7): 4 g via ORAL
  Filled 2014-11-11 (×9): qty 1

## 2014-11-11 MED ORDER — ACETAMINOPHEN 650 MG RE SUPP: 650.0000 mg | Freq: Four times a day (QID) | RECTAL | Status: DC | PRN

## 2014-11-11 MED ORDER — MOMETASONE FURO-FORMOTEROL FUM 100-5 MCG/ACT IN AERO
2.0000 | INHALATION_SPRAY | Freq: Two times a day (BID) | RESPIRATORY_TRACT | Status: DC
  Administered 2014-11-11 – 2014-11-14 (×7): 2 via RESPIRATORY_TRACT
  Filled 2014-11-11: qty 8.8

## 2014-11-11 MED ORDER — CARBIDOPA-LEVODOPA 25-100 MG PO TABS
1.0000 | ORAL_TABLET | ORAL | Status: DC
  Administered 2014-11-11 – 2014-11-14 (×7): 1 via ORAL
  Filled 2014-11-11 (×8): qty 1

## 2014-11-11 MED ORDER — POTASSIUM CHLORIDE CRYS ER 20 MEQ PO TBCR
20.0000 meq | EXTENDED_RELEASE_TABLET | Freq: Every day | ORAL | Status: DC
  Filled 2014-11-11: qty 1

## 2014-11-11 MED ORDER — CARBIDOPA-LEVODOPA 25-100 MG PO TABS
1.5000 | ORAL_TABLET | ORAL | Status: DC
  Administered 2014-11-11 – 2014-11-14 (×7): 1.5 via ORAL
  Filled 2014-11-11 (×9): qty 1.5

## 2014-11-11 MED ORDER — ENOXAPARIN SODIUM 40 MG/0.4ML ~~LOC~~ SOLN
40.0000 mg | SUBCUTANEOUS | Status: DC
  Administered 2014-11-11 – 2014-11-14 (×4): 40 mg via SUBCUTANEOUS
  Filled 2014-11-11 (×4): qty 0.4

## 2014-11-11 MED ORDER — CALCIUM CARBONATE 1250 (500 CA) MG PO TABS
1.0000 | ORAL_TABLET | Freq: Every day | ORAL | Status: DC
Start: 2014-11-11 — End: 2014-11-14
  Administered 2014-11-11 – 2014-11-14 (×4): 500 mg via ORAL
  Filled 2014-11-11 (×5): qty 1

## 2014-11-11 MED ORDER — IPRATROPIUM BROMIDE 0.03 % NA SOLN: 2.0000 | Freq: Two times a day (BID) | NASAL | Status: DC | PRN

## 2014-11-11 MED ORDER — VITAMIN B-12 1000 MCG PO TABS
1000.0000 ug | ORAL_TABLET | Freq: Every day | ORAL | Status: DC
  Administered 2014-11-11 – 2014-11-14 (×4): 1000 ug via ORAL
  Filled 2014-11-11 (×4): qty 1

## 2014-11-11 MED ORDER — POTASSIUM CHLORIDE CRYS ER 20 MEQ PO TBCR
40.0000 meq | EXTENDED_RELEASE_TABLET | Freq: Once | ORAL | Status: AC
  Administered 2014-11-11: 40 meq via ORAL
  Filled 2014-11-11: qty 2

## 2014-11-11 MED ORDER — ATORVASTATIN CALCIUM 40 MG PO TABS
40.0000 mg | ORAL_TABLET | Freq: Every day | ORAL | Status: DC
  Administered 2014-11-11 – 2014-11-14 (×4): 40 mg via ORAL
  Filled 2014-11-11 (×4): qty 1

## 2014-11-11 MED ORDER — CALCIUM CARBONATE 600 MG PO TABS: 600.0000 mg | ORAL_TABLET | Freq: Every day | ORAL | Status: DC

## 2014-11-11 NOTE — Progress Notes (Signed)
OT Cancellation Note  Patient Details Name: [name] [name] MRN: [id number] DOB: 05-16-1928   Cancelled Treatment:    Reason Eval/Treat Not Completed: OT screened, no needs identified, will sign off.  Pt and family report pt is functioning at his baseline and declined OT evaluation.    [name] 11/11/2014, 2:20 PM  [phone]

## 2014-11-11 NOTE — Progress Notes (Signed)
*  PRELIMINARY RESULTS* Vascular Ultrasound Lower extremity venous duplex has been completed.  Preliminary findings: negative for DVT  Landry Mellow, RDMS, RVT  11/11/2014, 3:14 PM

## 2014-11-11 NOTE — Progress Notes (Signed)
Patient arrived to [facility]  via hospital bed with nurse tech at [phone]. Patient alert and oriented x 4. No complaints of dizziness or syncope. Orthostatic vitals asymptomatic. adm

## 2014-11-11 NOTE — Progress Notes (Signed)
OT Cancellation Note  Patient Details Name: [name] [name] MRN: [id number] DOB: 1928-04-16   Cancelled Treatment:    Reason Eval/Treat Not Completed: Other (comment) spoke to pt and daughter and daughter reports they are waiting on a doppler to r/o DVT. Looked in system and note [place] order to r/o DVT under order history but not on active order screen. Spoke to nursing and informed her that family asking about this. Will check back after test results.   Jules Schick  [phone] 11/11/2014, 12:50 PM

## 2014-11-11 NOTE — Clinical Social Work Placement (Signed)
   CLINICAL SOCIAL WORK PLACEMENT  NOTE  Date:  11/11/2014  Patient Details  Name: [name] [name] MRN: [id number] Date of Birth: 02-06-1929  Clinical Social Work is seeking post-discharge placement for this patient at the [place] level of care (*CSW will initial, date and re-position this form in  chart as items are completed):  No (Return to same facility- at higher level of care.)   Patient/family provided with [place] [facility] Work Department's list of facilities offering this level of care within the geographic area requested by the patient (or if unable, by the patient's family).  No   Patient/family informed of their freedom to choose among providers that offer the needed level of care, that participate in Medicare, Medicaid or managed care program needed by the patient, have an available bed and are willing to accept the patient.  No   Patient/family informed of [facility]'s ownership interest in [facility] and [facility], as well as of the fact that they are under no obligation to receive care at these facilities.  PASRR submitted to EDS on  (NA- Does not require PASARR by this faclity)     PASRR number received on       Existing PASRR number confirmed on       FL2 transmitted to all facilities in geographic area requested by pt/family on       FL2 transmitted to all facilities within larger geographic area on       Patient informed that his/her managed care company has contracts with or will negotiate with certain facilities, including the following:   (na)     Yes   Patient/family informed of bed offers received.  Patient chooses bed at [facility]     Physician recommends and patient chooses bed at      Patient to be transferred to [facility] on  .  Patient to be transferred to facility by       Patient family notified on   of transfer.  Name of family member notified:        PHYSICIAN Please prepare priority discharge  summary, including medications, Please sign FL2, Please sign DNR     Additional Comment:    _______________________________________________ Williemae Area, LCSW 11/11/2014, 5:00 PM

## 2014-11-11 NOTE — Clinical Social Work Note (Signed)
Clinical Social Work Assessment  Patient Details  Name: [name] [name] MRN: [id number] Date of Birth: 05/01/1928  Date of referral:  11/11/14               Reason for consult:  Facility Placement                Permission sought to share information with:  Family Supports, Customer service manager Permission granted to share information::  Yes, Verbal Permission Granted  Name::     [employer]; Wife  Agency::     Relationship::     Contact Information:     Housing/Transportation Living arrangements for the past 2 months:  [place] of Information:  Patient, Spouse Patient Interpreter Needed:  None Criminal Activity/Legal Involvement Pertinent to Current Situation/Hospitalization:  No - Comment as needed Significant Relationships:  Spouse Lives with:  Spouse Do you feel safe going back to the place where you live?  Yes Need for family participation in patient care:  Yes (Comment)  Care giving concerns:  Wife does not feel she can manage patient at their apartment at this time; feels patient is "weaker today."     Social Worker assessment / plan:  81 year old male- admitted from [facility] where he currently lives with his wife [name] [name].)  SW received call from Ms Richardson Landry at the facility stating that she had received a call from patient's wife requesting rehab care for a short term until he is stronger. This was confirmed with patient and wife.  Facility will accept back at [facility]; ready tomorrow per MD.  Phoebe Perch initiated and placed and chart for MD's signature.  Wife unsure if they will transport him back via car or request EMS transport.  Employment status:  Retired Nurse, adult PT Recommendations:  Home with [place] / Referral to community resources:  [place]  Patient/Family's Response to care: Patient and wife are agreeable to disposition of short term SNF- only  want to return to [facility]- thus an outside bed search was not initated.  Patient/Family's Understanding of and Emotional Response to Diagnosis, Current Treatment, and Prognosis:  Patient defers to his wife for d/c decision making but is agreeable to SNF for rehab. Plans to return to apartment as soon as possible.  Wife verbalized that she was relieved that [facility] could accommodate patient for a weekend admission.  She feels that he will get "stronger faster".    Emotional Assessment Appearance:  Appears stated age Attitude/Demeanor/Rapport:   (Calm, relaxed and appropriate for situation) Affect (typically observed):  Quiet, Calm, Pleasant Orientation:  Oriented to Self, Oriented to Place, Oriented to  Time, Oriented to Situation Alcohol / Substance use:  Alcohol Use Psych involvement (Current and /or in the community):  No (Comment)  Discharge Needs  Concerns to be addressed:  Care Coordination Readmission within the last 30 days:    Current discharge risk:  None Barriers to Discharge:  No Barriers Identified   Williemae Area, LCSW 11/11/2014, 4:45 PM

## 2014-11-11 NOTE — Progress Notes (Signed)
Attempted to call for report on patient. ED nurse is to call back.

## 2014-11-11 NOTE — Telephone Encounter (Signed)
Opthamologist office did not call back so I called the wife this morning.  She said he had a stye and while it was being worked with he became clammy and felt faint.  The nurse came in and took his B/P and P.  His heart rate was irregulat.  He ended up going to [facility] and being admitted for the irregular heart beat

## 2014-11-11 NOTE — Evaluation (Signed)
Physical Therapy Evaluation Patient Details Name: [name] [name] MRN: [id number] DOB: 1928/10/21 Today's Date: 11/11/2014   History of Present Illness  81 y.o. male , resident [facility], PMH of CAD with prior anterior STEMI in January 2009 treated with BMS of LAD, follow-up 9/13 showed patent stent & no obstructive disease, normal Myoview January 2016, chronic DOE, HLD, HTN, Crohn's disease, GERD, Parkinson's disease, presented to [facility] ED on 11/10/14 with above complaints. Patient and spouse at bedside provided history. Patient was recently treated for an episode of upper extremity cellulitis in May 2016. He was also treated for an episode of Crohn's flare at the end of May and completed a course of steroid taper on 10/18/2014. He gets approximately 2 week history of intermittent dizziness, only in upright position without vertigo, headache, syncope or falls. He was at the ophthalmologist's office earlier in the day yesterday when he was leaning for his eyes be checked when he started feeling dizzy and clammy  Clinical Impression  Pt presents with above.  Note pt able to ambulate throughout unit with use of RW at min/guard to close S level.  No s/s of light headedness or orthostatic hypotension during PT session.  Pt and daughter state he is to D/C today.  Recommend HHPT for follow up and will defer further services to [place].      Follow Up Recommendations Home health PT;Supervision for mobility/OOB    Equipment Recommendations  None recommended by PT    Recommendations for Other Services       Precautions / Restrictions Precautions Precautions: Fall Restrictions Weight Bearing Restrictions: No      Mobility  Bed Mobility Overal bed mobility: Modified Independent             General bed mobility comments: allowed use of bed rail as he has one at home  Transfers Overall transfer level: Needs assistance Equipment used: Rolling walker (2 wheeled) Transfers: Sit  to/from Stand Sit to Stand: Min guard;Supervision         General transfer comment: Min/guard during first stand for safety, S on next with cues for hand placement and safety.   Ambulation/Gait Ambulation/Gait assistance: Min guard Ambulation Distance (Feet): 150 Feet Assistive device: Rolling walker (2 wheeled) Gait Pattern/deviations: Step-through pattern;Decreased stride length;Trunk flexed     General Gait Details: Pt overall steady with use of RW, needs cues for safe steering of RW, esp when turning as he tends to pick up RW.  Per pt and daughter, his RW has swivel wheels, therefore pt used to RW turning more smoothly.   Stairs            Wheelchair Mobility    Modified Rankin (Stroke Patients Only)       Balance Overall balance assessment: Needs assistance Sitting-balance support: Feet supported Sitting balance-Leahy Scale: Good     Standing balance support: During functional activity;No upper extremity supported Standing balance-Leahy Scale: Fair Standing balance comment: Pt able to stand at sink to wash/dry hands without UE support at S level.                              Pertinent Vitals/Pain Pain Assessment: No/denies pain    Home Living Family/patient expects to be discharged to:: Private residence ([facility]) Living Arrangements: Spouse/significant other Available Help at Discharge: Family;Available PRN/intermittently Type of Home: Apartment Home Access: Level entry     Home Layout: One level Home Equipment:  Shower seat - built in;Walker - 2 wheels;Hand held shower head Additional Comments: has bedrail to get OOB, uses toilet riser     Prior Function Level of Independence: Independent with assistive device(s)               Hand Dominance        Extremity/Trunk Assessment               Lower Extremity Assessment: Generalized weakness      Cervical / Trunk Assessment: Kyphotic  Communication    Communication: No difficulties  Cognition Arousal/Alertness: Awake/alert Behavior During Therapy: WFL for tasks assessed/performed Overall Cognitive Status: Within Functional Limits for tasks assessed                      General Comments      Exercises        Assessment/Plan    PT Assessment All further PT needs can be met in the next venue of care  PT Diagnosis Difficulty walking;Generalized weakness   PT Problem List Decreased strength;Decreased activity tolerance;Decreased balance;Decreased mobility;Decreased knowledge of use of DME  PT Treatment Interventions     PT Goals (Current goals can be found in the Care Plan section) Acute Rehab PT Goals Patient Stated Goal: to return home today PT Goal Formulation: With patient/family (pt to D/C today)    Frequency     Barriers to discharge        Co-evaluation               End of Session   Activity Tolerance: Patient tolerated treatment well Patient left: in chair;with call bell/phone within reach Nurse Communication: Mobility status         Time: 4159-7331 PT Time Calculation (min) (ACUTE ONLY): 25 min   Charges:   PT Evaluation $Initial PT Evaluation Tier I: 1 Procedure PT Treatments $Gait Training: 8-22 mins   PT G CodesDenice Bors 11/11/2014, 12:27 PM

## 2014-11-11 NOTE — Progress Notes (Signed)
[facility] PROGRESS NOTE  RALSTON VENUS ESP:[id number] DOB: [birth year] DOA: 11/10/2014 PCP: [name], MD  Assessment/Plan: 1. Orthostatic hypotension 1. Sbp drop from 118 to 76 from lying to sitting 2. Discussed with family. Pt reportedly has hx of very poor intake of fluids. He was being treated with increasing doses of lasix prior to admission for B LE swelling (see below). 3. Pt reports feeling thirsty with dry mucus membranes 4. Suspect dehydration 5. Hold diuretics and cont on gentle hydration overnight 6. Renal function normal 2. Dizziness 1. Suspect secondary to above dehydration/orthostatic hypotension 2. Cont IVF as tolerated 3. HTN 1. BP stable and controlled 2. Cont to monitor 4. Crohn's disease 1. Stable 2. Pt was weaned off of steroids recently 5. Dyspnea on exertion 1. Unclear etiology 2. CXR clear 3. On min O2 support 6. B LE edema 1. Suspect chronic venous stasis 2. LE dopplers neg for DVT 3. Recommend LE elevation 7. CAD 1. Stable 2. No chest pain 8. Parkinson's 1. stable 9. DVT prophylaxis 1. Lovenox subQ  Code Status: DNR Family Communication: Pt in room, family at bedside (indicate person spoken with, relationship, and if by phone, the number) Disposition Plan: Pending possible SNF   Consultants:    Procedures:    Antibiotics:   (indicate start date, and stop date if known)  HPI/Subjective: Feels dizzy when standing  Objective: Filed Vitals:   11/11/14 0030 11/11/14 0140 11/11/14 0200 11/11/14 1335  BP: 151/93 136/93  124/71  Pulse: 103 106  107  Temp:  97.9 F (36.6 C)  97.7 F (36.5 C)  TempSrc:  Oral  Oral  Resp: 21 22  18   Height:  5' 10"  (1.778 m)    Weight:  79.107 kg (174 lb 6.4 oz)    SpO2: 98% 99% 99% 97%    Intake/Output Summary (Last 24 hours) at 11/11/14 1702 Last data filed at 11/11/14 1310  Gross per 24 hour  Intake    960 ml  Output    951 ml  Net      9 ml   Filed Weights   11/10/14 1832  11/11/14 0140  Weight: 78.926 kg (174 lb) 79.107 kg (174 lb 6.4 oz)    Exam:   General:  Awake, in nad  Cardiovascular: regular, s1, s2  Respiratory: normal resp effort, no wheezing  Abdomen: soft,nondistended  Musculoskeletal: perfused, no clubbing, trace LE edema B   Data Reviewed: Basic Metabolic Panel:  Recent Labs Lab 11/10/14 1933 11/11/14 0710  NA 139 138  K 4.6 3.3*  CL 104 98*  CO2 27 28  GLUCOSE 109* 107*  BUN 11 8  CREATININE 1.16 1.09  CALCIUM 8.6* 8.2*   Liver Function Tests: No results for input(s): AST, ALT, ALKPHOS, BILITOT, PROT, ALBUMIN in the last 168 hours. No results for input(s): LIPASE, AMYLASE in the last 168 hours. No results for input(s): AMMONIA in the last 168 hours. CBC:  Recent Labs Lab 11/10/14 1933  WBC 5.9  NEUTROABS 3.1  HGB 13.9  HCT 41.2  MCV 101.2*  PLT 208   Cardiac Enzymes:  Recent Labs Lab 11/11/14 0238 11/11/14 0710 11/11/14 1358  TROPONINI <0.03 <0.03 <0.03   BNP (last 3 results)  Recent Labs  09/05/14 1128 11/10/14 1934  BNP 38.8 160.2*    ProBNP (last 3 results) No results for input(s): PROBNP in the last 8760 hours.  CBG: No results for input(s): GLUCAP in the last 168 hours.  Recent Results (from the past 240  hour(s))  MRSA PCR Screening     Status: None   Collection Time: 11/11/14  1:37 AM  Result Value Ref Range Status   MRSA by PCR NEGATIVE NEGATIVE Final    Comment:        The GeneXpert MRSA Assay (FDA approved for NASAL specimens only), is one component of a comprehensive MRSA colonization surveillance program. It is not intended to diagnose MRSA infection nor to guide or monitor treatment for MRSA infections.      Studies: Dg Chest 2 View  11/10/2014   CLINICAL DATA:  Weakness, hypotension, syncope  EXAM: CHEST  2 VIEW  COMPARISON:  09/05/2014  FINDINGS: The heart size is mildly enlarged, stable. Both lungs are clear. The visualized skeletal structures are unremarkable.   IMPRESSION: No active cardiopulmonary disease.   Electronically Signed   By: Conchita Paris M.D.   On: 11/10/2014 19:55   Ct Angio Chest Pe W/cm &/or Wo Cm  11/10/2014   CLINICAL DATA:  Weakness lightheadedness and hypotension.  EXAM: CT ANGIOGRAPHY CHEST WITH CONTRAST  TECHNIQUE: Multidetector CT imaging of the chest was performed using the standard protocol during bolus administration of intravenous contrast. Multiplanar CT image reconstructions and MIPs were obtained to evaluate the vascular anatomy.  CONTRAST:  59m OMNIPAQUE IOHEXOL 350 MG/ML SOLN  COMPARISON:  09/05/2014  FINDINGS: Cardiovascular: There is good opacification of the pulmonary arteries. There is no pulmonary embolism. The thoracic aorta is normal in caliber and intact.  Lungs: Clear except for minimal atelectatic appearing peripheral basilar opacity on the left, reduced from 09/05/2014.  Central airways: Patent  Effusions: None  Lymphadenopathy: None  Esophagus: Unremarkable  Upper abdomen: No significant abnormality  Musculoskeletal: Moderate degenerative disc changes. No significant abnormality.  Review of the MIP images confirms the above findings.  IMPRESSION: Negative for acute pulmonary embolism or other acute abnormality in the chest. Minimal atelectatic appearing peripheral basilar opacities on the left.   Electronically Signed   By: DAndreas NewportM.D.   On: 11/10/2014 22:42    Scheduled Meds: . aspirin EC  81 mg Oral Daily  . atorvastatin  40 mg Oral Daily  . calcium carbonate  1 tablet Oral Q breakfast  . carbidopa-levodopa  1 tablet Oral 2 times per day  . carbidopa-levodopa  1.5 tablet Oral 2 times per day  . carvedilol  6.25 mg Oral BID WC  . cholecalciferol  1,000 Units Oral Daily  . cholestyramine  4 g Oral BID WC  . enoxaparin (LOVENOX) injection  40 mg Subcutaneous Q24H  . isosorbide mononitrate  30 mg Oral Daily  . mometasone-formoterol  2 puff Inhalation BID  . pantoprazole  40 mg Oral BID  . [START ON  11/12/2014] potassium chloride SA  20 mEq Oral Daily  . sodium chloride  3 mL Intravenous Q12H  . tamsulosin  0.4 mg Oral Daily  . vitamin B-12  1,000 mcg Oral Daily   Continuous Infusions: . sodium chloride 75 mL/hr at 11/11/14 1659    Principal Problem:   Diastolic CHF, acute Active Problems:   Hyperlipidemia   Essential hypertension   GERD   Crohn's disease   Dyspnea on exertion   Dizziness   Acute diastolic CHF (congestive heart failure)   Pressure ulcer    Melaya Hoselton K  [facility] Pager [phone] If 7PM-7AM, please contact night-coverage at [email or web address], password [email or web address]8/08/2014, 5:02 PM  LOS: 0 days

## 2014-11-11 NOTE — H&P (Signed)
History and Physical  [name] [name] DOB: 1928-07-06 DOA: 11/10/2014  Referring physician: Baron [name], ED PA-C PCP: [name] Reel, MD  Outpatient Specialists:  1. Cardiology: Dr. Peter [name] 2. GI: Dr. Owens [name] 3. Neurology: Dr. Floyde [name]  Chief Complaint: Dizziness, dyspnea on exertion and bilateral leg swellings.  HPI: [name] [name] is a 81 y.o. male , resident of [place] retirement home, PMH of CAD with prior anterior STEMI in January 2009 treated with BMS of LAD, follow-up 9/13 showed patent stent & no obstructive disease, normal Myoview January 2016, chronic DOE, HLD, HTN, Crohn's disease, GERD, Parkinson's disease, presented to [facility] ED on 11/10/14 with above complaints. Patient and spouse at bedside provided history. Patient was recently treated for an episode of upper extremity cellulitis in May 2016. He was also treated for an episode of Crohn's flare at the end of May and completed a course of steroid taper on 10/18/2014. He gets approximately 2 week history of intermittent dizziness, only in upright position without vertigo, headache, syncope or falls. He was at the ophthalmologist's office earlier in the day yesterday when he was leaning for his eyes be checked when he started feeling dizzy and clammy. Nurses apparently checked his heart rate and was told to be irregular and his blood pressure low (no numbers given). They return to the retirement home where heart rate and blood pressure was again checked and was also told that the heart rate was irregular and blood pressures were low (they don't know any numbers) and were advised to come to the ED to have it checked out. At baseline patient has chronic dyspnea on exertion but over the last 3-4 weeks this has progressively gotten worse. He denies orthopnea or PND. His legs have been progressively swollen for the last 3-4 weeks left greater than the right. He denies any leg pain or recent travel. No chest  pain, cough, fever or chills reported. He states that he has gained weight-undetermined amounts. His wife also feels that his abdomen is distended. In the ED, lab work were unremarkable, chest x-ray showed no acute cardiopulmonary disease and CTA chest was negative for acute pulmonary embolus or other acute abnormality. Hospitalist admission was requested.  Review of Systems: All systems reviewed and apart from history of presenting illness, are negative.  Past Medical History  Diagnosis Date  . Hypertension   . Hyperlipidemia   . Coronary artery disease     s/p ANTERIOR STEMI 1/09 at [place]; treated with a bare-metal stent to the LAD; Myoview 11/11 EF 71%, no scar, no ischemia;     cath 12/20/10:   pLAD 30-40%, stent ok, ? If stent oversized, EF 60% and low LVEDP  . GERD (gastroesophageal reflux disease)   . Other esophagitis   . Diaphragmatic hernia without mention of obstruction or gangrene   . Ulcerative (chronic) ileocolitis   . Diverticulosis of colon (without mention of hemorrhage)   . Other chronic nonalcoholic liver disease   . Regional enteritis of small intestine   . Flatulence, eructation, and gas pain   . Ischemic cardiomyopathy     EF initially 35-40% after MI 1/09; echo 7/08: EF 60%  . Fatty liver     on ultrasound of 10/2009  . Parkinson's disease   . Gait disorder   . Memory disorder   . Osteoporosis   . Renal calculi   . Melanoma   . RBBB 09/02/2013  . HOH (hard of hearing)  Hearing aids  . Cellulitis   . Diastolic CHF, acute on chronic 11/11/2014   Past Surgical History  Procedure Laterality Date  . Cardiac catheterization  09/25/06    EF 35-40% but more recently 60%  . Cholecystectomy    . Appendectomy    . Back surgery      L3, L4, L5  . Tonsillectomy    . Partial bowel resection    . Coronary artery stent placement    . Melanoma resection    . Cataract extraction      Bilateral   Social History:  reports that he has never smoked. He has never used  smokeless tobacco. He reports that he drinks alcohol. He reports that he does not use illicit drugs. Married. Lives in a retirement home. Ambulates with the help of a walker.  No Known Allergies  Family History  Problem Relation Age of Onset  . Coronary artery disease    . Heart failure Mother   . Emphysema Father   . Heart disease Brother   . Heart disease Brother   . Colon cancer Brother     Prior to Admission medications   Medication Sig Start Date End Date Taking? Authorizing Provider  acetaminophen (TYLENOL) 325 MG tablet Take 650 mg by mouth every 6 (six) hours as needed for pain.   Yes Historical Provider, MD  ADVAIR DISKUS 250-50 MCG/DOSE AEPB Inhale 1 puff into the lungs daily as needed. For shortness of breath 01/24/14  Yes Historical Provider, MD  aspirin EC 81 MG tablet Take 81 mg by mouth daily.   Yes Historical Provider, MD  atorvastatin (LIPITOR) 40 MG tablet Take 40 mg by mouth daily.     Yes Historical Provider, MD  calcium carbonate (CALCIUM 600) 600 MG TABS tablet Take 600 mg by mouth daily with breakfast.   Yes Historical Provider, MD  carbidopa-levodopa (SINEMET IR) 25-100 MG per tablet 1.5 tablets in the morning and evening, one tablet at midday and at dinnertime 08/17/14  Yes [name], MD  carvedilol (COREG) 6.25 MG tablet Take 6.25 mg by mouth 2 (two) times daily with a meal.   Yes Historical Provider, MD  Cholecalciferol (VITAMIN D-3) 1000 UNITS CAPS Take 1,000 Units by mouth daily.   Yes Historical Provider, MD  cholestyramine Lucrezia Starch) 4 G packet TAKE 1 PACKET BY MOUTH TWICE DAILY WITH A MEAL 11/15/13  Yes [name], MD  fluorouracil (EFUDEX) 5 % cream Apply 1 application topically daily as needed. For fungus   Yes Historical Provider, MD  ipratropium (ATROVENT) 0.03 % nasal spray Place 2 sprays into both nostrils every 12 (twelve) hours.   Yes Historical Provider, MD  isosorbide mononitrate (IMDUR) 30 MG 24 hr tablet TAKE ONE TABLET EACH DAY  10/31/14  Yes Jolaine Artist, MD  itraconazole (SPORANOX) 100 MG capsule Take 100 mg by mouth 2 (two) times daily.  09/30/14  Yes Historical Provider, MD  loperamide (IMODIUM) 2 MG capsule Take 2 mg by mouth daily as needed for diarrhea or loose stools.   Yes Historical Provider, MD  Multiple Vitamins-Minerals (CENTRUM SILVER PO) Take 1 tablet by mouth daily.   Yes Historical Provider, MD  mupirocin ointment (BACTROBAN) 2 % Apply 1 application topically daily as needed (skin irritation).  09/02/14  Yes Historical Provider, MD  nitroGLYCERIN (NITROSTAT) 0.4 MG SL tablet Place 0.4 mg under the tongue every 5 (five) minutes as needed for chest pain.   Yes Historical Provider, MD  pantoprazole (PROTONIX) 40  MG tablet Take 1 tablet by mouth 2 (two) times daily. 09/14/14  Yes Historical Provider, MD  potassium chloride SA (K-DUR,KLOR-CON) 20 MEQ tablet Take 20 mEq by mouth daily.   Yes Historical Provider, MD  tamsulosin (FLOMAX) 0.4 MG CAPS capsule Take 0.4 mg by mouth daily. 07/25/14  Yes Historical Provider, MD  vitamin B-12 (CYANOCOBALAMIN) 1000 MCG tablet Take 1,000 mcg by mouth daily.   Yes Historical Provider, MD   Physical Exam: Filed Vitals:   11/10/14 2345 11/11/14 0000 11/11/14 0015 11/11/14 0030  BP:  146/86  151/93  Pulse: 89 101  103  Temp:      TempSrc:      Resp: 18 21 22 21   Height:      Weight:      SpO2: 97% 96%  98%     General exam: Moderately built and nourished pleasant elderly male patient, lying comfortably propped up on the gurney in no obvious distress. Moon face which appears slightly flushed and thick neck.  Head, eyes and ENT: Nontraumatic and normocephalic. Pupils equally reacting to light and accommodation. Oral mucosa moist.  Neck: Supple. No JVD, carotid bruit or thyromegaly.  Lymphatics: No lymphadenopathy.  Respiratory system: Slightly diminished breath sounds in bases with occasional basal crackles. Rest of lung fields are clear to auscultation. No  increased work of breathing.  Cardiovascular system: S1 and S2 heard, RRR. No JVD, murmurs.? Gallop. 1+ pitting bilateral leg edema, left >right. Sacral edema +.  Gastrointestinal system: Abdomen is distended, soft and nontender. Normal bowel sounds heard. No organomegaly or masses appreciated.  Central nervous system: Alert and oriented. No focal neurological deficits.  Extremities: Symmetric 5 x 5 power. Peripheral pulses symmetrically felt. No clinical features suggestive of cellulitis of legs.  Skin: No rashes or acute findings.  Musculoskeletal system: Negative exam.  Psychiatry: Pleasant and cooperative.   Labs on Admission:  Basic Metabolic Panel:  Recent Labs Lab 11/10/14 1933  NA 139  K 4.6  CL 104  CO2 27  GLUCOSE 109*  BUN 11  CREATININE 1.16  CALCIUM 8.6*   Liver Function Tests: No results for input(s): AST, ALT, ALKPHOS, BILITOT, PROT, ALBUMIN in the last 168 hours. No results for input(s): LIPASE, AMYLASE in the last 168 hours. No results for input(s): AMMONIA in the last 168 hours. CBC:  Recent Labs Lab 11/10/14 1933  WBC 5.9  NEUTROABS 3.1  HGB 13.9  HCT 41.2  MCV 101.2*  PLT 208   Cardiac Enzymes: No results for input(s): CKTOTAL, CKMB, CKMBINDEX, TROPONINI in the last 168 hours.  BNP (last 3 results) No results for input(s): PROBNP in the last 8760 hours. CBG: No results for input(s): GLUCAP in the last 168 hours.  Radiological Exams on Admission: Dg Chest 2 View  11/10/2014   CLINICAL DATA:  Weakness, hypotension, syncope  EXAM: CHEST  2 VIEW  COMPARISON:  09/05/2014  FINDINGS: The heart size is mildly enlarged, stable. Both lungs are clear. The visualized skeletal structures are unremarkable.  IMPRESSION: No active cardiopulmonary disease.   Electronically Signed   By: Conchita Paris M.D.   On: 11/10/2014 19:55   Ct Angio Chest Pe W/cm &/or Wo Cm  11/10/2014   CLINICAL DATA:  Weakness lightheadedness and hypotension.  EXAM: CT  ANGIOGRAPHY CHEST WITH CONTRAST  TECHNIQUE: Multidetector CT imaging of the chest was performed using the standard protocol during bolus administration of intravenous contrast. Multiplanar CT image reconstructions and MIPs were obtained to evaluate the vascular anatomy.  CONTRAST:  55m OMNIPAQUE IOHEXOL 350 MG/ML SOLN  COMPARISON:  09/05/2014  FINDINGS: Cardiovascular: There is good opacification of the pulmonary arteries. There is no pulmonary embolism. The thoracic aorta is normal in caliber and intact.  Lungs: Clear except for minimal atelectatic appearing peripheral basilar opacity on the left, reduced from 09/05/2014.  Central airways: Patent  Effusions: None  Lymphadenopathy: None  Esophagus: Unremarkable  Upper abdomen: No significant abnormality  Musculoskeletal: Moderate degenerative disc changes. No significant abnormality.  Review of the MIP images confirms the above findings.  IMPRESSION: Negative for acute pulmonary embolism or other acute abnormality in the chest. Minimal atelectatic appearing peripheral basilar opacities on the left.   Electronically Signed   By: DAndreas NewportM.D.   On: 11/10/2014 22:42    EKG: Independently reviewed. Sinus rhythm, normal axis, RBBB (not new) and no acute changes. QTC 460 ms.  Assessment/Plan Principal Problem:   Diastolic CHF, acute Active Problems:   Hyperlipidemia   Essential hypertension   GERD   Crohn's disease   Dyspnea on exertion   Dizziness   Acute diastolic CHF (congestive heart failure)   1. Acute diastolic CHF: Admit to telemetry. Patient has received a dose of IV Lasix 40 mg in the ED. Continue Lasix 40 mg every 12 hours. Strict intake and output and daily weights. 2-D echo 05/06/14: LVEF 50-55 percent and grade 1 diastolic dysfunction. This may have been precipitated by recent steroids and fluid retention. May consider cardiology consultation in a.m. 2. Dizziness: Etiology is unclear. Will check orthostatic blood pressures. No  focal deficits noted. No vertiginous symptoms to suggest BPPV.? Related to Parkinson's disease. OT evaluation. 3. Essential hypertension: Controlled. Continue home dose of carvedilol. 4. GERD: Continue twice a day PPI 5. Crohn's disease: As per spouse, supposed to start Remicade per GI as outpatient. Seems to be controlled. 6. Dyspnea on exertion, subacute on chronic: Decline is probably related to volume overload and CHF. Chronic DOE of unclear etiology. May consider sleep study to rule out sleep apnea if not already done so. 7. Bilateral lower extremity edema: Likely related to problem #1 and hypoalbuminemia. Rule out DVT. Will check lower extremity venous Dopplers. Continue IV Lasix. 8. CAD: Asymptomatic of chest pain. Continue aspirin, statins and beta blockers 9. Parkinson's disease: Continue Sinemet.    DVT prophylaxis: Lovenox Code Status: Full  Family Communication: Discussed extensively with spouse at bedside.  Disposition Plan: DC home when medically stable, possibly in the next 2-3 days.   Time spent: 757minutes  Gaius Ishaq, MD, FACP, FHM. [facility] Pager [phone] If 7PM-7AM, please contact night-coverage [email or web address] Password [email or web address] 11/11/2014, 12:38 AM

## 2014-11-11 NOTE — Telephone Encounter (Signed)
Patient's wife called stating patient is in hospital irregular heart beat and fluctuating blood pressure. He has an appointment on Monday with Dr Jannifer Franklin. She doesn't know when he will be released but did not want to c/a his appt at this time as it is hard to get in with Dr Jannifer Franklin. Just an FYI.

## 2014-11-11 NOTE — Progress Notes (Signed)
Received report on patient from ED nurse, Mikki Santee. ED nurse is to give Vancomycin before transferring patient to [facility]W.

## 2014-11-11 NOTE — Telephone Encounter (Signed)
Noted. We can check the computer to see if he is still in the hospital on Monday morning.

## 2014-11-12 DIAGNOSIS — I5033 Acute on chronic diastolic (congestive) heart failure: Principal | ICD-10-CM

## 2014-11-12 DIAGNOSIS — I951 Orthostatic hypotension: Secondary | ICD-10-CM

## 2014-11-12 LAB — BASIC METABOLIC PANEL
Anion gap: 9 (ref 5–15)
BUN: 11 mg/dL (ref 6–20)
CO2: 26 mmol/L (ref 22–32)
Calcium: 7.7 mg/dL — ABNORMAL LOW (ref 8.9–10.3)
Chloride: 97 mmol/L — ABNORMAL LOW (ref 101–111)
Creatinine, Ser: 0.98 mg/dL (ref 0.61–1.24)
GFR calc Af Amer: >60 mL/min (ref >60)
GFR calc non Af Amer: >60 mL/min (ref >60)
Glucose, Bld: 120 mg/dL — ABNORMAL HIGH (ref 65–99)
Potassium: 3.5 mmol/L (ref 3.5–5.1)
Sodium: 132 mmol/L — ABNORMAL LOW (ref 135–145)

## 2014-11-12 LAB — TSH: TSH: 2.428 u[IU]/mL (ref 0.350–4.500)

## 2014-11-12 LAB — CORTISOL: Cortisol, Plasma: 6.5 ug/dL

## 2014-11-12 MED ORDER — MIDODRINE HCL 2.5 MG PO TABS: 2.5000 mg | ORAL_TABLET | Freq: Three times a day (TID) | ORAL | Status: DC

## 2014-11-12 MED ORDER — FUROSEMIDE 10 MG/ML IJ SOLN
40.0000 mg | Freq: Every day | INTRAMUSCULAR | Status: DC
  Administered 2014-11-12 – 2014-11-14 (×3): 40 mg via INTRAVENOUS
  Filled 2014-11-12 (×4): qty 4

## 2014-11-12 MED ORDER — FLUDROCORTISONE ACETATE 0.1 MG PO TABS
0.1000 mg | ORAL_TABLET | Freq: Every day | ORAL | Status: DC
  Filled 2014-11-12: qty 1

## 2014-11-12 MED ORDER — MIDODRINE HCL 2.5 MG PO TABS
2.5000 mg | ORAL_TABLET | Freq: Two times a day (BID) | ORAL | Status: DC
  Administered 2014-11-12 – 2014-11-14 (×4): 2.5 mg via ORAL
  Filled 2014-11-12 (×6): qty 1

## 2014-11-12 NOTE — Progress Notes (Signed)
[facility] PROGRESS NOTE  LENN VOLKER UTM:[id number] DOB: 1928/07/24 DOA: 11/10/2014 PCP: [name], MD  Assessment/Plan: 1. Orthostatic hypotension 1. Still orthostatic this AM 2. Pt reportedly has hx of very poor intake of fluids. He was being treated with increasing doses of lasix prior to admission for B LE swelling (see below). 3. Pt had reported feeling thirsty with dry mucus membranes 4. Initially suspected mild dehydration with some improvement with gentle hydration 5. Renal function remained normal 6. On further questioning, pt recently stopped chronic steroids for Crohn's in transition to Remicade. AM cortisol of 6.5 (cut off at 6.7) 7. Discussed case with GI who is not opposed to starting trial of stress dosed steroids. Will start trial of florinef 2. Dizziness 1. Initially thought to be secondary to dehydration/orthostatic hypotension 2. Possibly secondary to adrenal insufficiency given borderline low cortisol 3. HTN 1. BP stable and controlled 2. Cont to monitor 4. Crohn's disease 1. Stable 2. Pt was weaned off of steroids around 8 weeks prior to admit 3. Discussed case with pt's primary GI who is not opposed to florinef if needed 5. Dyspnea on exertion 1. CXR clear 2. On min O2 support 3. Suspect secondary to adrenal insufficiency 6. B LE edema 1. Possible chronic venous stasis 2. LE dopplers neg for DVT 3. Recommend LE elevation 7. CAD 1. Stable 2. No chest pain 8. Parkinson's 1. stable 9. DVT prophylaxis 1. Lovenox subQ  Code Status: DNR Family Communication: Pt in room, family at bedside  Disposition Plan: Pending possible SNF when stable   Consultants:    Procedures:    Antibiotics:   (indicate start date, and stop date if known)  HPI/Subjective: Eager to return to facility  Objective: Filed Vitals:   11/11/14 2029 11/11/14 2116 11/12/14 0528 11/12/14 0811  BP:  120/83    Pulse:  97    Temp:  97.8 F (36.6 C)    TempSrc:   Oral    Resp:  18    Height:      Weight:   80.468 kg (177 lb 6.4 oz)   SpO2: 98% 98%  98%    Intake/Output Summary (Last 24 hours) at 11/12/14 1445 Last data filed at 11/12/14 0615  Gross per 24 hour  Intake    360 ml  Output      0 ml  Net    360 ml   Filed Weights   11/10/14 1832 11/11/14 0140 11/12/14 0528  Weight: 78.926 kg (174 lb) 79.107 kg (174 lb 6.4 oz) 80.468 kg (177 lb 6.4 oz)    Exam:   General:  Awake, in nad, laying in bed  Cardiovascular: regular, s1, s2  Respiratory: normal resp effort, no wheezing  Abdomen: soft,nondistended, pos BS  Musculoskeletal: perfused, no clubbing, trace LE edema B   Data Reviewed: Basic Metabolic Panel:  Recent Labs Lab 11/10/14 1933 11/11/14 0710 11/12/14 1108  NA 139 138 132*  K 4.6 3.3* 3.5  CL 104 98* 97*  CO2 27 28 26   GLUCOSE 109* 107* 120*  BUN 11 8 11   CREATININE 1.16 1.09 0.98  CALCIUM 8.6* 8.2* 7.7*   Liver Function Tests: No results for input(s): AST, ALT, ALKPHOS, BILITOT, PROT, ALBUMIN in the last 168 hours. No results for input(s): LIPASE, AMYLASE in the last 168 hours. No results for input(s): AMMONIA in the last 168 hours. CBC:  Recent Labs Lab 11/10/14 1933  WBC 5.9  NEUTROABS 3.1  HGB 13.9  HCT 41.2  MCV 101.2*  PLT 208   Cardiac Enzymes:  Recent Labs Lab 11/11/14 0238 11/11/14 0710 11/11/14 1358  TROPONINI <0.03 <0.03 <0.03   BNP (last 3 results)  Recent Labs  09/05/14 1128 11/10/14 1934  BNP 38.8 160.2*    ProBNP (last 3 results) No results for input(s): PROBNP in the last 8760 hours.  CBG: No results for input(s): GLUCAP in the last 168 hours.  Recent Results (from the past 240 hour(s))  MRSA PCR Screening     Status: None   Collection Time: 11/11/14  1:37 AM  Result Value Ref Range Status   MRSA by PCR NEGATIVE NEGATIVE Final    Comment:        The GeneXpert MRSA Assay (FDA approved for NASAL specimens only), is one component of a comprehensive MRSA  colonization surveillance program. It is not intended to diagnose MRSA infection nor to guide or monitor treatment for MRSA infections.      Studies: Dg Chest 2 View  11/10/2014   CLINICAL DATA:  Weakness, hypotension, syncope  EXAM: CHEST  2 VIEW  COMPARISON:  09/05/2014  FINDINGS: The heart size is mildly enlarged, stable. Both lungs are clear. The visualized skeletal structures are unremarkable.  IMPRESSION: No active cardiopulmonary disease.   Electronically Signed   By: Conchita Paris M.D.   On: 11/10/2014 19:55   Ct Angio Chest Pe W/cm &/or Wo Cm  11/10/2014   CLINICAL DATA:  Weakness lightheadedness and hypotension.  EXAM: CT ANGIOGRAPHY CHEST WITH CONTRAST  TECHNIQUE: Multidetector CT imaging of the chest was performed using the standard protocol during bolus administration of intravenous contrast. Multiplanar CT image reconstructions and MIPs were obtained to evaluate the vascular anatomy.  CONTRAST:  9m OMNIPAQUE IOHEXOL 350 MG/ML SOLN  COMPARISON:  09/05/2014  FINDINGS: Cardiovascular: There is good opacification of the pulmonary arteries. There is no pulmonary embolism. The thoracic aorta is normal in caliber and intact.  Lungs: Clear except for minimal atelectatic appearing peripheral basilar opacity on the left, reduced from 09/05/2014.  Central airways: Patent  Effusions: None  Lymphadenopathy: None  Esophagus: Unremarkable  Upper abdomen: No significant abnormality  Musculoskeletal: Moderate degenerative disc changes. No significant abnormality.  Review of the MIP images confirms the above findings.  IMPRESSION: Negative for acute pulmonary embolism or other acute abnormality in the chest. Minimal atelectatic appearing peripheral basilar opacities on the left.   Electronically Signed   By: DAndreas NewportM.D.   On: 11/10/2014 22:42    Scheduled Meds: . aspirin EC  81 mg Oral Daily  . atorvastatin  40 mg Oral Daily  . calcium carbonate  1 tablet Oral Q breakfast  .  carbidopa-levodopa  1 tablet Oral 2 times per day  . carbidopa-levodopa  1.5 tablet Oral 2 times per day  . carvedilol  6.25 mg Oral BID WC  . cholecalciferol  1,000 Units Oral Daily  . cholestyramine  4 g Oral BID WC  . enoxaparin (LOVENOX) injection  40 mg Subcutaneous Q24H  . isosorbide mononitrate  30 mg Oral Daily  . mometasone-formoterol  2 puff Inhalation BID  . pantoprazole  40 mg Oral BID  . potassium chloride SA  20 mEq Oral Daily  . sodium chloride  3 mL Intravenous Q12H  . tamsulosin  0.4 mg Oral Daily  . vitamin B-12  1,000 mcg Oral Daily   Continuous Infusions:    Principal Problem:   Orthostatic hypotension Active Problems:   Hyperlipidemia   Essential hypertension   GERD   Crohn's  disease   Dyspnea on exertion   Diastolic CHF, acute   Dizziness   Acute diastolic CHF (congestive heart failure)   Pressure ulcer    CHIU, STEPHEN K  [facility] Pager [phone]. If 7PM-7AM, please contact night-coverage at [email or web address], password [facility] 11/12/2014, 2:45 PM  LOS: 1 day

## 2014-11-12 NOTE — Evaluation (Signed)
Physical Therapy Re-Evaluation Patient Details Name: [name] [name] MRN: [id number] DOB: 1928/04/18 Today's Date: 11/12/2014   History of Present Illness  81 y.o. male , resident of [facility], admitted 11/10/14 with dehydration and orthostatic hypotension.   PMH of CAD with prior anterior STEMI in January 2009 treated with BMS of LAD, follow-up 9/13 showed patent stent & no obstructive disease, normal Myoview January 2016, chronic DOE, HLD, HTN, Crohn's disease, GERD, Parkinson's disease.   Clinical Impression  Patient requiring min assist for mobility today.  Per chart, wife feels unable to care for patient at this level.  Patient being d/c'ed to SNF for continued therapy.    Follow Up Recommendations SNF;Supervision/Assistance - 24 hour    Equipment Recommendations  None recommended by PT    Recommendations for Other Services       Precautions / Restrictions Precautions Precautions: Fall Precaution Comments: orthostatic Restrictions Weight Bearing Restrictions: No      Mobility  Bed Mobility Overal bed mobility: Needs Assistance Bed Mobility: Supine to Sit;Sit to Supine     Supine to sit: Min assist Sit to supine: Min guard   General bed mobility comments: Patient requiring assist to raise trunk to sitting position today.  Once upright, patient with good sitting balance.  Transfers Overall transfer level: Needs assistance Equipment used: Rolling walker (2 wheeled) Transfers: Sit to/from Stand Sit to Stand: Min assist         General transfer comment: Verbal cues for hand placement.  Patient attempted to stand, losing balance and sitting back onto bed.  Required min assist to rise to standing and for balance.  Ambulation/Gait Ambulation/Gait assistance: Min guard Ambulation Distance (Feet): 110 Feet Assistive device: Rolling walker (2 wheeled) Gait Pattern/deviations: Step-through pattern;Decreased stride length;Trunk flexed Gait velocity:  Decreased Gait velocity interpretation: Below normal speed for age/gender General Gait Details: Verbal cues to stand upright and stay close to RW for safety.  Patient pushing RW too far ahead of himself.  Stairs            Wheelchair Mobility    Modified Rankin (Stroke Patients Only)       Balance                                             Pertinent Vitals/Pain Pain Assessment: No/denies pain    Home Living Family/patient expects to be discharged to:: Skilled nursing facility (Wife reports unable to care for patient until stronger) Living Arrangements: Spouse/significant other Available Help at Discharge: Family;Available PRN/intermittently Type of Home: Independent living facility         Home Equipment: Shower seat - built in;Walker - 2 wheels;Hand held shower head Additional Comments: has bedrail to get OOB, uses toilet riser     Prior Function Level of Independence: Independent with assistive device(s)               Hand Dominance        Extremity/Trunk Assessment   Upper Extremity Assessment: Generalized weakness           Lower Extremity Assessment: Generalized weakness (Edema Bil feet, Lt > Rt)      Cervical / Trunk Assessment: Kyphotic  Communication   Communication: No difficulties  Cognition Arousal/Alertness: Awake/alert Behavior During Therapy: WFL for tasks assessed/performed Overall Cognitive Status: Within Functional Limits for tasks assessed  General Comments      Exercises        Assessment/Plan    PT Assessment Patient needs continued PT services  PT Diagnosis Difficulty walking;Generalized weakness   PT Problem List Decreased strength;Decreased balance;Decreased mobility;Cardiopulmonary status limiting activity  PT Treatment Interventions DME instruction;Gait training;Functional mobility training;Therapeutic activities;Therapeutic exercise;Patient/family education    PT Goals (Current goals can be found in the Care Plan section) Acute Rehab PT Goals Patient Stated Goal: To go to rehab to get stronger PT Goal Formulation: With patient Time For Goal Achievement: 11/19/14 Potential to Achieve Goals: Good    Frequency Min 2X/week   Barriers to discharge   Per chart, wife feels unable to care for patient in ILF setting.    Co-evaluation               End of Session Equipment Utilized During Treatment: Gait belt Activity Tolerance: Patient tolerated treatment well Patient left: in bed;with call bell/phone within reach Nurse Communication: Mobility status         Time: 2773-7505 PT Time Calculation (min) (ACUTE ONLY): 11 min   Charges:   PT Evaluation $PT Re-evaluation: 1 Procedure     PT G Codes:        Despina Pole 2014/11/14, 5:16 PM Carita Pian. Sanjuana Kava, [place] Pager [phone]

## 2014-11-12 NOTE — Consult Note (Addendum)
Admit date: 11/10/2014 Referring Physician  Dr. Wyline Copas Primary Physician  Dr. Shon Baton Primary Cardiologist  Dr. Peter [name] Reason for Consultation  CHF, SOB, orthostatic hypotension  HPI: [name] [name] is a very pleasant 81yo WM with a history of ASCAD. He is s/p anterior STEMI in Jan 2009 at [facility] in [place]. He had a BMS to the LAD. EF was initially 35-40% but subsequently returned to normal. Last myoview in Jan 2016 was normal. Last cath in Sept. 2013 showed a patent stent. 30-40% proximal LAD. EF 60% and normal EDP. He islimited by his Parkinsonism and resides at [facility] with his wife. He also has a history of Chron's disease which has been somewhat difficult to control and was recently on Steroids in May and completed the steroid taper 10/18/2014.  He has a history of HTN, HL, and GERD.  More recently he has had a flair of his Chron's disease and is now on Remicade. His Parkinson's symptoms are also worse. He had a recent cellulitis of the right arm.   He was last seen by Dr. [name] in June and had noted some mild ankle swelling.  He has chronic DOE which has gotten worse over the past few months. Dr. [name] felt most of his SOB was due to deconditioning.  He was admitted to the hospital with increasing LE edema, DOE and weight gain.  His son says that he is up 6 lbs.  He does weigh himself at home but at different times of the day.  He denies any chest pain and had a nuclear stress test January 2016 which was normal.   He gives an approximately 2 week history of intermittent dizziness, only in upright position without vertigo, headache, syncope or falls. He was at the ophthalmologist's office Thursday when he was leaning for his eyes to be checked and  he started feeling dizzy and clammy. Nurses apparently checked his heart rate and was told to be irregular and his blood pressure low (no numbers given). They returned to the retirement home where heart rate and blood pressure was again  checked and was also told that the heart rate was irregular and blood pressures were low (they don't know any numbers) and were advised to come to the ED to have it checked out. At baseline patient has chronic dyspnea on exertion but over the last 3-4 weeks this has progressively gotten worse. He denies orthopnea or PND. His legs have been progressively swollen for the last 3-4 weeks left greater than the right. He denies any leg pain or recent travel. No chest pain, cough, fever or chills reported.  His wife also feels that his abdomen is distended but the patient says it is just from eating too muc. In the ED, lab work were unremarkable, chest x-ray showed no acute cardiopulmonary disease and CTA chest was negative for acute pulmonary embolus or other acute abnormality. Hospitalist admission was requested.  According to the family he is very noncompliant with sodium restricted diet.  He uses a salt shaker quite a bit and likes to snack on salty food.  He eats a lot of processed meats like hot dogs, bacon and luncheon meat.  His wife says that he has a tremendous appetite even after stopping the steroids and will eat 3 sandwhiches in a setting a lunch.  She thinks it is because he is bored.  On admission troponin was negative x 2.  Renal function stable.  TSH normal.  Chest xray and  chest CT showed no edema.  He also has been having some problems with orthostasis with BP dropping.  An am cortisol level was checked due to recently coming off a prolonged course of steroids and it was slightly below normal.     PMH:   Past Medical History  Diagnosis Date  . Hypertension   . Hyperlipidemia   . Coronary artery disease     s/p ANTERIOR STEMI 1/09 at [facility]; treated with a bare-metal stent to the LAD; Myoview 11/11 EF 71%, no scar, no ischemia;     cath 12/20/10:   pLAD 30-40%, stent ok, ? If stent oversized, EF 60% and low LVEDP  . GERD (gastroesophageal reflux disease)   . Other esophagitis   .  Diaphragmatic hernia without mention of obstruction or gangrene   . Ulcerative (chronic) ileocolitis   . Diverticulosis of colon (without mention of hemorrhage)   . Other chronic nonalcoholic liver disease   . Regional enteritis of small intestine   . Flatulence, eructation, and gas pain   . Ischemic cardiomyopathy     EF initially 35-40% after MI 1/09; echo 7/08: EF 60%  . Fatty liver     on ultrasound of 10/2009  . Parkinson's disease   . Gait disorder   . Memory disorder   . Osteoporosis   . Renal calculi   . Melanoma   . RBBB 09/02/2013  . HOH (hard of hearing)     Hearing aids  . Cellulitis   . Diastolic CHF, acute on chronic 11/11/2014     PSH:   Past Surgical History  Procedure Laterality Date  . Cholecystectomy    . Appendectomy    . Back surgery      L3, L4, L5  . Tonsillectomy    . Partial bowel resection    . Coronary artery stent placement    . Melanoma resection    . Cataract extraction      Bilateral  . Cardiac catheterization  09/25/06    EF 35-40% but more recently 60%    Allergies:  Review of patient's allergies indicates no known allergies. Prior to Admit Meds:   Facility-administered medications prior to admission  Medication Dose Route Frequency Provider Last Rate Last Dose  . [DISCONTINUED] acetaminophen (TYLENOL) tablet 650 mg  650 mg Oral Once [name] Banister, MD      . [DISCONTINUED] diphenhydrAMINE (BENADRYL) capsule 50 mg  50 mg Oral Once [name] Banister, MD      . [DISCONTINUED] inFLIXimab (REMICADE) 5 mg/kg = 400 mg in sodium chloride 0.9 % 250 mL infusion  5 mg/kg Intravenous Once [name] Banister, MD       Prescriptions prior to admission  Medication Sig Dispense Refill Last Dose  . acetaminophen (TYLENOL) 325 MG tablet Take 650 mg by mouth every 6 (six) hours as needed for pain.   11/09/2014 at Unknown time  . ADVAIR DISKUS 250-50 MCG/DOSE AEPB Inhale 1 puff into the lungs daily as needed. For shortness of breath   11/10/2014 at Unknown time  .  aspirin EC 81 MG tablet Take 81 mg by mouth daily.   11/10/2014 at Unknown time  . atorvastatin (LIPITOR) 40 MG tablet Take 40 mg by mouth daily.     unknown at Unknown time  . calcium carbonate (CALCIUM 600) 600 MG TABS tablet Take 600 mg by mouth daily with breakfast.   11/10/2014 at Unknown time  . carbidopa-levodopa (SINEMET IR) 25-100 MG per tablet 1.5 tablets  in the morning and evening, one tablet at midday and at dinnertime 450 tablet 1 11/10/2014 at Unknown time  . carvedilol (COREG) 6.25 MG tablet Take 6.25 mg by mouth 2 (two) times daily with a meal.   11/10/2014 at 0800  . Cholecalciferol (VITAMIN D-3) 1000 UNITS CAPS Take 1,000 Units by mouth daily.   11/10/2014 at Unknown time  . cholestyramine (QUESTRAN) 4 G packet TAKE 1 PACKET BY MOUTH TWICE DAILY WITH A MEAL 60 each 11 11/10/2014 at Unknown time  . fluorouracil (EFUDEX) 5 % cream Apply 1 application topically daily as needed. For fungus   11/09/2014 at Unknown time  . ipratropium (ATROVENT) 0.03 % nasal spray Place 2 sprays into both nostrils every 12 (twelve) hours.   Past Week at Unknown time  . isosorbide mononitrate (IMDUR) 30 MG 24 hr tablet TAKE ONE TABLET EACH DAY 30 tablet 3 11/10/2014 at Unknown time  . itraconazole (SPORANOX) 100 MG capsule Take 100 mg by mouth 2 (two) times daily.    unknown at Unknown time  . loperamide (IMODIUM) 2 MG capsule Take 2 mg by mouth daily as needed for diarrhea or loose stools.   Past Month at Unknown time  . Multiple Vitamins-Minerals (CENTRUM SILVER PO) Take 1 tablet by mouth daily.   11/10/2014 at Unknown time  . mupirocin ointment (BACTROBAN) 2 % Apply 1 application topically daily as needed (skin irritation).    unknown at unknown  . nitroGLYCERIN (NITROSTAT) 0.4 MG SL tablet Place 0.4 mg under the tongue every 5 (five) minutes as needed for chest pain.   Past Week at Unknown time  . pantoprazole (PROTONIX) 40 MG tablet Take 1 tablet by mouth 2 (two) times daily.   11/10/2014 at Unknown time  . potassium  chloride SA (K-DUR,KLOR-CON) 20 MEQ tablet Take 20 mEq by mouth daily.   11/10/2014 at Unknown time  . tamsulosin (FLOMAX) 0.4 MG CAPS capsule Take 0.4 mg by mouth daily.   11/10/2014 at Unknown time  . vitamin B-12 (CYANOCOBALAMIN) 1000 MCG tablet Take 1,000 mcg by mouth daily.   11/10/2014 at Unknown time   Fam HX:    Family History  Problem Relation Age of Onset  . Coronary artery disease    . Heart failure Mother   . Emphysema Father   . Heart disease Brother   . Heart disease Brother   . Colon cancer Brother    Social HX:    History   Social History  . Marital Status: Married    Spouse Name: N/A  . Number of Children: 3  . Years of Education: college   Occupational History  . CEO-retired   . CEO    Social History Main Topics  . Smoking status: Never Smoker   . Smokeless tobacco: Never Used  . Alcohol Use: Yes     Comment: occasional  . Drug Use: No  . Sexual Activity: Not on file   Other Topics Concern  . Not on file   Social History Narrative   Patient is right handed.   Patient drinks about 1 cup of caffeine daily.     ROS:  All 11 ROS were addressed and are negative except what is stated in the HPI  Physical Exam: Blood pressure 120/83, pulse 97, temperature 97.8 F (36.6 C), temperature source Oral, resp. rate 18, height 5' 10"  (1.778 m), weight 177 lb 6.4 oz (80.468 kg), SpO2 98 %.    General: Well developed, well nourished, in no acute distress Head:  Eyes PERRLA, No xanthomas.   Normal cephalic and atramatic  Lungs:   Clear bilaterally to auscultation and percussion. Heart:   HRRR S1 S2 Pulses are 2+ & equal.            No carotid bruit. No JVD.  No abdominal bruits. No femoral bruits. Abdomen: Bowel sounds are positive, abdomen non-tender without masses.  Abdomen appears somewhat protruberant and tight Extremities:   No clubbing, cyanosis. DP +1. Bilateral LE edema 2-3+ Neuro: Alert and oriented X 3. Psych:  Good affect, responds  appropriately    Labs:   Lab Results  Component Value Date   WBC 5.9 11/10/2014   HGB 13.9 11/10/2014   HCT 41.2 11/10/2014   MCV 101.2* 11/10/2014   PLT 208 11/10/2014    Recent Labs Lab 11/12/14 1108  NA 132*  K 3.5  CL 97*  CO2 26  BUN 11  CREATININE 0.98  CALCIUM 7.7*  GLUCOSE 120*   No results found for: PTT Lab Results  Component Value Date   INR 1.03 11/10/2014   INR 1.07 09/29/2012   INR 1.0 12/14/2010   Lab Results  Component Value Date   TROPONINI <0.03 11/11/2014    No results found for: CHOL No results found for: HDL No results found for: LDLCALC No results found for: TRIG No results found for: CHOLHDL No results found for: LDLDIRECT    Radiology:  Dg Chest 2 View  11/10/2014   CLINICAL DATA:  Weakness, hypotension, syncope  EXAM: CHEST  2 VIEW  COMPARISON:  09/05/2014  FINDINGS: The heart size is mildly enlarged, stable. Both lungs are clear. The visualized skeletal structures are unremarkable.  IMPRESSION: No active cardiopulmonary disease.   Electronically Signed   By: Conchita Paris M.D.   On: 11/10/2014 19:55   Ct Angio Chest Pe W/cm &/or Wo Cm  11/10/2014   CLINICAL DATA:  Weakness lightheadedness and hypotension.  EXAM: CT ANGIOGRAPHY CHEST WITH CONTRAST  TECHNIQUE: Multidetector CT imaging of the chest was performed using the standard protocol during bolus administration of intravenous contrast. Multiplanar CT image reconstructions and MIPs were obtained to evaluate the vascular anatomy.  CONTRAST:  49m OMNIPAQUE IOHEXOL 350 MG/ML SOLN  COMPARISON:  09/05/2014  FINDINGS: Cardiovascular: There is good opacification of the pulmonary arteries. There is no pulmonary embolism. The thoracic aorta is normal in caliber and intact.  Lungs: Clear except for minimal atelectatic appearing peripheral basilar opacity on the left, reduced from 09/05/2014.  Central airways: Patent  Effusions: None  Lymphadenopathy: None  Esophagus: Unremarkable  Upper abdomen: No  significant abnormality  Musculoskeletal: Moderate degenerative disc changes. No significant abnormality.  Review of the MIP images confirms the above findings.  IMPRESSION: Negative for acute pulmonary embolism or other acute abnormality in the chest. Minimal atelectatic appearing peripheral basilar opacities on the left.   Electronically Signed   By: DAndreas NewportM.D.   On: 11/10/2014 22:42    EKG:  NSR with PAC's and RBBB  ASSESSMENT/PLAN: 1.  Acute on chronic diastolic CHF - this is multifactorial largely from dietary indiscretion with sodium restricted diet and recent steroid use. Morbid obesity is also playing a role in marked LE edema.  He weigts himself but is sound as if this is erratic and never at the same time of the day.  According to his son he is up 6lbs. His weight was 172 lbs at last OV with Dr. JMartiniquein June and now 177 lbs.   His wife states  that she tries to get him to make healthy choices at meal time at [facility] but he gets mad and yells at her.  He has marked LE edema on exam today.  Lungs are clear as well as chest xray.  He has primarily right sided heart failure.   His abdomen is protruberant as well.  His BNP was minimally elevated at 160.  With his significant obesity is most likely has OSA which may be contributing as well and recommend outpt sleep study.  Would use compression hose to help with his LE edema.  He needs diuresis as he is in acute CHF.  Will start Lasix 48m IV day and titrate as needed and as BP tolerates and recheck 2D echo.  Will have him seen by [facility] team.   2.  Orthostatic hypotension initially thought to be due to dehydration from poor PO intake but suspect it is most likely from autonomic insufficiency from Parkinson's doubt very mild adrenal insuff from recent prolonged steroid use is contributing that much.    TRH has started florinef for adrenal insuff which I would stop given history of CHF.  This will exacerbate his CHF.  A better option  would be midodrine which will help with autonomic insuff.  His am cortisol level is very minimally suppressed. Would stop flomax which is known to exacerbate orthostasis.  Start midodrine 2.522mBID and titrate as needed for hypotension. 3.  ASCAD s/p remote anterior STEMI with BMS to LAD 2009 with no ischemia on myoview in Jan.  Continue ASA/statin/BB/long acting nitrate 4.  HTN - controlled 5.  Dyslipdiemia 6.  Chron's disease - GI apparently going to start Remicade.  I have spent a total of 60 minutes reviewing patient's prior chart, reviewing meds, labs, chest xray and CT scan as well as interviewing and examining patient and discussing patient's illness at length with various family members.  > 50% of time was spent in direct patient care.   TUSueanne MargaritaMD  11/12/2014  3:09 PM

## 2014-11-13 DIAGNOSIS — E785 Hyperlipidemia, unspecified: Secondary | ICD-10-CM

## 2014-11-13 DIAGNOSIS — I10 Essential (primary) hypertension: Secondary | ICD-10-CM

## 2014-11-13 DIAGNOSIS — R609 Edema, unspecified: Secondary | ICD-10-CM | POA: Insufficient documentation

## 2014-11-13 DIAGNOSIS — I5031 Acute diastolic (congestive) heart failure: Secondary | ICD-10-CM

## 2014-11-13 LAB — BASIC METABOLIC PANEL
Anion gap: 9 (ref 5–15)
BUN: 8 mg/dL (ref 6–20)
CO2: 28 mmol/L (ref 22–32)
Calcium: 8 mg/dL — ABNORMAL LOW (ref 8.9–10.3)
Chloride: 101 mmol/L (ref 101–111)
Creatinine, Ser: 0.95 mg/dL (ref 0.61–1.24)
GFR calc Af Amer: >60 mL/min (ref >60)
GFR calc non Af Amer: >60 mL/min (ref >60)
Glucose, Bld: 99 mg/dL (ref 65–99)
Potassium: 3.3 mmol/L — ABNORMAL LOW (ref 3.5–5.1)
Sodium: 138 mmol/L (ref 135–145)

## 2014-11-13 NOTE — Progress Notes (Signed)
In regards to intake and output monitoring on this patient.  The patient has put out a great deal of output since this morning's lasix, but the measurements may be inaccurate due to incontinent episodes that the pt has had in his depends.

## 2014-11-13 NOTE — Progress Notes (Signed)
Patient Name: [name] [name] Date of Encounter: 11/13/2014  Principal Problem:   Orthostatic hypotension Active Problems:   Hyperlipidemia   Essential hypertension   GERD   Crohn's disease   Dyspnea on exertion   Diastolic CHF, acute   Dizziness   Acute diastolic CHF (congestive heart failure)   Pressure ulcer   Length of Stay: 2  SUBJECTIVE He feels much better - no dyspnea at rest Mediocre diuresis. Balanced in/out. However, he reports brisk diuresis, suspect it was not recorded. Weight [phone] since admission. Still has substantial edema.   CURRENT MEDS . aspirin EC  81 mg Oral Daily  . atorvastatin  40 mg Oral Daily  . calcium carbonate  1 tablet Oral Q breakfast  . carbidopa-levodopa  1 tablet Oral 2 times per day  . carbidopa-levodopa  1.5 tablet Oral 2 times per day  . carvedilol  6.25 mg Oral BID WC  . cholecalciferol  1,000 Units Oral Daily  . cholestyramine  4 g Oral BID WC  . enoxaparin (LOVENOX) injection  40 mg Subcutaneous Q24H  . furosemide  40 mg Intravenous Daily  . isosorbide mononitrate  30 mg Oral Daily  . midodrine  2.5 mg Oral BID WC  . mometasone-formoterol  2 puff Inhalation BID  . pantoprazole  40 mg Oral BID  . potassium chloride SA  20 mEq Oral Daily  . sodium chloride  3 mL Intravenous Q12H  . vitamin B-12  1,000 mcg Oral Daily    OBJECTIVE   Intake/Output Summary (Last 24 hours) at 11/13/14 0948 Last data filed at 11/13/14 0845  Gross per 24 hour  Intake    950 ml  Output   1275 ml  Net   -325 ml   Filed Weights   11/11/14 0140 11/12/14 0528 11/13/14 0440  Weight: 174 lb 6.4 oz (79.107 kg) 177 lb 6.4 oz (80.468 kg) 175 lb 1.6 oz (79.425 kg)    PHYSICAL EXAM Filed Vitals:   11/12/14 1958 11/12/14 2030 11/13/14 0440 11/13/14 0849  BP:  127/74 147/85   Pulse: 76 95 93   Temp:  98.4 F (36.9 C) 98.2 F (36.8 C)   TempSrc:  Oral Oral   Resp: 18 18 18    Height:      Weight:   175 lb 1.6 oz (79.425 kg)   SpO2: 98%  96% 99% 94%   General: Alert, oriented x3, no distress Head: no evidence of trauma, PERRL, EOMI, no exophtalmos or lid lag, no myxedema, no xanthelasma; normal ears, nose and oropharynx Neck: normal jugular venous pulsations and no hepatojugular reflux; brisk carotid pulses without delay and no carotid bruits Chest: clear to auscultation, no signs of consolidation by percussion or palpation, normal fremitus, symmetrical and full respiratory excursions Cardiovascular: normal position and quality of the apical impulse, regular rhythm, normal first and widely split second heart sound, no rubs or gallops, no murmur Abdomen: no tenderness or distention, no masses by palpation, no abnormal pulsatility or arterial bruits, normal bowel sounds, no hepatosplenomegaly Extremities: no clubbing, cyanosis; 2+ pitting edema 1/2 way to knees; 2+ radial, ulnar and brachial pulses bilaterally; 2+ right femoral, posterior tibial and dorsalis pedis pulses; 2+ left femoral, posterior tibial and dorsalis pedis pulses; no subclavian or femoral bruits Neurological: grossly nonfocal  LABS  CBC  Recent Labs  11/10/14 1933  WBC 5.9  NEUTROABS 3.1  HGB 13.9  HCT 41.2  MCV 101.2*  PLT 371   Basic Metabolic Panel  Recent Labs  11/12/14  1108 11/13/14 0700  NA 132* 138  K 3.5 3.3*  CL 97* 101  CO2 26 28  GLUCOSE 120* 99  BUN 11 8  CREATININE 0.98 0.95  CALCIUM 7.7* 8.0*     Recent Labs  11/11/14 0238 11/11/14 0710 11/11/14 1358  TROPONINI <0.03 <0.03 <0.03    Recent Labs  11/12/14 1103  TSH 2.428    Radiology Studies Imaging results have been reviewed and No results found.  TELE NSR. PACs  ECG Sinus tachy, PACs, RBBB  ASSESSMENT AND PLAN  1. Acute on chronic diastolic HF, predominantly (or entirely?) right sided. Echo pending. COPD, likely also OSA. Poor compliance (actually, none) with sodium restriction. Still markedly edematous. 2. Remote history of anterior STEMI (LAD bare  metal stent 2009), no angina and normal perfusion Jan 2016. Normal LVEF. 3. Orthostatic hypotension, age, autonomic insufficiency (Parkinson's) and medication related (Sinemet, alpha blockers). Compression stockings and midodrine. BP OK today. Avoid sodium loading and avoid fludrocortisone. 4. Crohn's disease, recent steroid taper   Sanda Klein, MD, [facility] [phone] office [phone] pager 11/13/2014 9:48 AM

## 2014-11-13 NOTE — Progress Notes (Signed)
[facility] PROGRESS NOTE  [name] CNO:[id number] DOB: [birth year] DOA: 11/10/2014 PCP: [name], MD  Assessment/Plan: 1. Orthostatic hypotension 1. Still orthostatic this AM 2. Pt reportedly has hx of very poor intake of fluids. He was being treated with increasing doses of lasix prior to admission for B LE swelling (see below). 3. Pt had reported feeling thirsty with dry mucus membranes 4. Initially suspected mild dehydration with some improvement with gentle hydration 5. Renal function remained normal 6. On further questioning, pt recently stopped chronic steroids for Crohn's in transition to Remicade. AM cortisol of 6.5 (cut off at 6.7) 7. On midodrine  2. Dizziness 1. Initially thought to be secondary to dehydration/orthostatic hypotension 2. Possibly secondary to adrenal insufficiency given borderline low cortisol 3. Now on midodrine  4. Improved 3. HTN 1. BP stable and controlled 2. Cont to monitor 4. Crohn's disease 1. Stable 2. Pt was weaned off of steroids around 8 weeks prior to admit 5. Dyspnea on exertion 1. CXR clear 2. On min O2 support 3. Suspect secondary to adrenal insufficiency 6. B LE edema 1. Possible chronic venous stasis 2. LE dopplers neg for DVT 3. Recommend LE elevation 7. CAD 1. Stable 2. No chest pain 8. Parkinson's 1. Remains stable 9. DVT prophylaxis 1. Lovenox subQ 10. Acute diastolic CHF 1. On IV lasix 2. Cont diuresis for now, re-eval in AM  Code Status: DNR Family Communication: Pt in room Disposition Plan: Pending possible SNF when stable   Consultants:    Procedures:    Antibiotics:   (indicate start date, and stop date if known)  HPI/Subjective: Feels better today, wants to return to facility  Objective: Filed Vitals:   11/12/14 2030 11/13/14 0440 11/13/14 0849 11/13/14 1000  BP: 127/74 147/85  137/75  Pulse: 95 93  93  Temp: 98.4 F (36.9 C) 98.2 F (36.8 C)  98.5 F (36.9 C)  TempSrc: Oral  Oral  Oral  Resp: 18 18  18   Height:      Weight:  79.425 kg (175 lb 1.6 oz)    SpO2: 96% 99% 94% 95%    Intake/Output Summary (Last 24 hours) at 11/13/14 1620 Last data filed at 11/13/14 1300  Gross per 24 hour  Intake    710 ml  Output   1676 ml  Net   -966 ml   Filed Weights   11/11/14 0140 11/12/14 0528 11/13/14 0440  Weight: 79.107 kg (174 lb 6.4 oz) 80.468 kg (177 lb 6.4 oz) 79.425 kg (175 lb 1.6 oz)    Exam:   General:  Awake, sitting in chair, in nad  Cardiovascular: regular, s1, s2  Respiratory: normal resp effort, no wheezing  Abdomen: soft,nondistended, pos BS  Musculoskeletal: perfused, no clubbing, trace LE edema B improving  Data Reviewed: Basic Metabolic Panel:  Recent Labs Lab 11/10/14 1933 11/11/14 0710 11/12/14 1108 11/13/14 0700  NA 139 138 132* 138  K 4.6 3.3* 3.5 3.3*  CL 104 98* 97* 101  CO2 27 28 26 28   GLUCOSE 109* 107* 120* 99  BUN 11 8 11 8   CREATININE 1.16 1.09 0.98 0.95  CALCIUM 8.6* 8.2* 7.7* 8.0*   Liver Function Tests: No results for input(s): AST, ALT, ALKPHOS, BILITOT, PROT, ALBUMIN in the last 168 hours. No results for input(s): LIPASE, AMYLASE in the last 168 hours. No results for input(s): AMMONIA in the last 168 hours. CBC:  Recent Labs Lab 11/10/14 1933  WBC 5.9  NEUTROABS 3.1  HGB 13.9  HCT 41.2  MCV 101.2*  PLT 208   Cardiac Enzymes:  Recent Labs Lab 11/11/14 0238 11/11/14 0710 11/11/14 1358  TROPONINI <0.03 <0.03 <0.03   BNP (last 3 results)  Recent Labs  09/05/14 1128 11/10/14 1934  BNP 38.8 160.2*    ProBNP (last 3 results) No results for input(s): PROBNP in the last 8760 hours.  CBG: No results for input(s): GLUCAP in the last 168 hours.  Recent Results (from the past 240 hour(s))  MRSA PCR Screening     Status: None   Collection Time: 11/11/14  1:37 AM  Result Value Ref Range Status   MRSA by PCR NEGATIVE NEGATIVE Final    Comment:        The GeneXpert MRSA Assay (FDA approved  for NASAL specimens only), is one component of a comprehensive MRSA colonization surveillance program. It is not intended to diagnose MRSA infection nor to guide or monitor treatment for MRSA infections.      Studies: No results found.  Scheduled Meds: . aspirin EC  81 mg Oral Daily  . atorvastatin  40 mg Oral Daily  . calcium carbonate  1 tablet Oral Q breakfast  . carbidopa-levodopa  1 tablet Oral 2 times per day  . carbidopa-levodopa  1.5 tablet Oral 2 times per day  . carvedilol  6.25 mg Oral BID WC  . cholecalciferol  1,000 Units Oral Daily  . cholestyramine  4 g Oral BID WC  . enoxaparin (LOVENOX) injection  40 mg Subcutaneous Q24H  . furosemide  40 mg Intravenous Daily  . isosorbide mononitrate  30 mg Oral Daily  . midodrine  2.5 mg Oral BID WC  . mometasone-formoterol  2 puff Inhalation BID  . pantoprazole  40 mg Oral BID  . potassium chloride SA  20 mEq Oral Daily  . sodium chloride  3 mL Intravenous Q12H  . vitamin B-12  1,000 mcg Oral Daily   Continuous Infusions:    Principal Problem:   Orthostatic hypotension Active Problems:   Hyperlipidemia   Essential hypertension   GERD   Crohn's disease   Dyspnea on exertion   Diastolic CHF, acute   Dizziness   Acute diastolic CHF (congestive heart failure)   Pressure ulcer    Deana Krock K  [facility] Pager [phone]. If 7PM-7AM, please contact night-coverage at [email or web address], password [facility] 11/13/2014, 4:20 PM  LOS: 2 days

## 2014-11-14 ENCOUNTER — Inpatient Hospital Stay (HOSPITAL_COMMUNITY): Payer: Medicare Other

## 2014-11-14 ENCOUNTER — Ambulatory Visit: Payer: Medicare Other | Admitting: Neurology

## 2014-11-14 ENCOUNTER — Other Ambulatory Visit (HOSPITAL_COMMUNITY): Payer: Medicare Other

## 2014-11-14 ENCOUNTER — Other Ambulatory Visit: Payer: Self-pay | Admitting: *Deleted

## 2014-11-14 DIAGNOSIS — R609 Edema, unspecified: Secondary | ICD-10-CM

## 2014-11-14 DIAGNOSIS — I509 Heart failure, unspecified: Secondary | ICD-10-CM

## 2014-11-14 LAB — BASIC METABOLIC PANEL
Anion gap: 11 (ref 5–15)
BUN: 8 mg/dL (ref 6–20)
CO2: 29 mmol/L (ref 22–32)
Calcium: 8 mg/dL — ABNORMAL LOW (ref 8.9–10.3)
Chloride: 96 mmol/L — ABNORMAL LOW (ref 101–111)
Creatinine, Ser: 0.96 mg/dL (ref 0.61–1.24)
GFR calc Af Amer: >60 mL/min (ref >60)
GFR calc non Af Amer: >60 mL/min (ref >60)
Glucose, Bld: 96 mg/dL (ref 65–99)
Potassium: 3.3 mmol/L — ABNORMAL LOW (ref 3.5–5.1)
Sodium: 136 mmol/L (ref 135–145)

## 2014-11-14 MED ORDER — POTASSIUM CHLORIDE CRYS ER 20 MEQ PO TBCR: 20.0000 meq | EXTENDED_RELEASE_TABLET | Freq: Every day | ORAL | Status: DC

## 2014-11-14 MED ORDER — FUROSEMIDE 20 MG PO TABS: 20.0000 mg | ORAL_TABLET | Freq: Every day | ORAL | Status: DC

## 2014-11-14 MED ORDER — POTASSIUM CHLORIDE CRYS ER 20 MEQ PO TBCR: 40.0000 meq | EXTENDED_RELEASE_TABLET | Freq: Every day | ORAL | Status: DC

## 2014-11-14 MED ORDER — MIDODRINE HCL 2.5 MG PO TABS: 2.5000 mg | ORAL_TABLET | Freq: Two times a day (BID) | ORAL | Status: DC

## 2014-11-14 MED ORDER — POTASSIUM CHLORIDE CRYS ER 20 MEQ PO TBCR
40.0000 meq | EXTENDED_RELEASE_TABLET | Freq: Two times a day (BID) | ORAL | Status: DC
  Administered 2014-11-14: 40 meq via ORAL
  Filled 2014-11-14: qty 2

## 2014-11-14 NOTE — Clinical Social Work Placement (Signed)
   CLINICAL SOCIAL WORK PLACEMENT  NOTE  Date:  11/14/2014  Patient Details  Name: KANIN LIA MRN: [id number] Date of Birth: 25-Feb-1929  Clinical Social Work is seeking post-discharge placement for this patient at the [place] level of care (*CSW will initial, date and re-position this form in  chart as items are completed):  No (Return to same facility- at higher level of care.)   Patient/family provided with [place] Work Department's list of facilities offering this level of care within the geographic area requested by the patient (or if unable, by the patient's family).  No   Patient/family informed of their freedom to choose among providers that offer the needed level of care, that participate in Medicare, Medicaid or managed care program needed by the patient, have an available bed and are willing to accept the patient.  No   Patient/family informed of [facility]'s ownership interest in [facility] and [facility], as well as of the fact that they are under no obligation to receive care at these facilities.  PASRR submitted to EDS on  (NA- Does not require PASARR by this faclity)     PASRR number received on       Existing PASRR number confirmed on       FL2 transmitted to all facilities in geographic area requested by pt/family on 11/12/14      FL2 transmitted to all facilities within larger geographic area on       Patient informed that his/her managed care company has contracts with or will negotiate with certain facilities, including the following:   (na)     Yes   Patient/family informed of bed offers received.  Patient chooses bed at [facility]     Physician recommends and patient chooses bed at      Patient to be transferred to [facility] on  11/12/14.  Patient to be transferred to facility by   Car with wife    Patient family notified on 11/14/14 of transfer.  Name of family member notified:  Wife:  Emilee Hero-      PHYSICIAN Please prepare priority discharge summary, including medications, Please sign FL2, Please sign DNR     Additional Comment: OK per MD for d/c today to [facility] for rehab care prior to returning to independent living. CSW met with patient earlier today and he stated that he was feeling much better and ready to leave today. He was fully agreeable to short term SNF and feels that this is best choice to help him to recover.  CSW also spoke to his wife [name] who was also pleased with d/c plan and feels that she can manage him in the car for transport. She plans to contact her son to be on "standby" as she takes him home.  Nursing notified to call report.  No further CSW needs identified.  CSW signing off. Lorie Phenix. Murrell Redden [phone]      _______________________________________________ Williemae Area, LCSW 11/14/2014, 4:07 PM

## 2014-11-14 NOTE — Discharge Summary (Addendum)
Physician Discharge Summary  [name] [name] OTL:[id number] DOB: 09/11/1928 DOA: 11/10/2014  PCP: [name], MD  Admit date: 11/10/2014 Discharge date: 11/14/2014  Time spent: 20 minutes  Recommendations for Outpatient Follow-up:  1. Follow up with PCP in 1-2 weeks 2. Please place compression hoses to B LE as tolerated  Discharge Diagnoses:  Principal Problem:   Orthostatic hypotension Active Problems:   Hyperlipidemia   Essential hypertension   GERD   Crohn's disease   Dyspnea on exertion   Diastolic CHF, acute   Dizziness   Acute diastolic CHF (congestive heart failure)   Pressure ulcer   Peripheral edema   Discharge Condition: Improved  Diet recommendation: Heart Healthy (2gm sodium restricted)  Filed Weights   11/12/14 0528 11/13/14 0440 11/14/14 0428  Weight: 80.468 kg (177 lb 6.4 oz) 79.425 kg (175 lb 1.6 oz) 79.334 kg (174 lb 14.4 oz)    History of present illness:  Please see admit h and p from 8/5 for details. Briefly, pt presented with complaints of dizziness, dyspnea, and BLE edema. The patient was admitted for further work up.  Hospital Course:  1. Orthostatic hypotension 1. Improved with midodrine 2. Pt reportedly has hx of very poor intake of fluids. He was being treated with increasing doses of lasix prior to admission for B LE swelling (see below). 3. Pt had reported feeling thirsty with dry mucus membranes 4. Initially suspected mild dehydration with some improvement with gentle hydration 5. Renal function remained normal 6. On further questioning, pt recently stopped chronic steroids for Crohn's in transition to Remicade. AM cortisol of 6.5 (cut off at 6.7)  2. Dizziness 1. Initially thought to be secondary to dehydration/orthostatic hypotension 2. Also considering mild adrenal insufficiency given borderline low cortisol 3. Now on midodrine  4. Improved and near baseline 3. HTN 1. BP stable and controlled 2. Cont to monitor 4. Crohn's  disease 1. Stable 2. Pt was weaned off of steroids around 8 weeks prior to admit 3. Followed by Dr. Ardis Hughs 5. Dyspnea on exertion 1. CXR clear 2. On min O2 support 3. Improved 6. B LE edema 1. LE dopplers neg for DVT 2. Recommend LE elevation 3. Cardiology recommended TED hoses as tolerated 7. CAD 1. Stable 2. No chest pain 8. Parkinson's 1. Remains stable 9. DVT prophylaxis 1. Lovenox subQ 10. Acute diastolic CHF 1. Was continued on IV lasix 2. Cont diuresis for now, re-eval in AM  Consultations:  Cardiology  Discharge Exam: Filed Vitals:   11/13/14 2115 11/14/14 0428 11/14/14 0807 11/14/14 0932  BP: 126/72 149/85  112/58  Pulse: 109 87  90  Temp: 98.4 F (36.9 C) 98.2 F (36.8 C)    TempSrc: Oral Oral    Resp: 18 18  18   Height:      Weight:  79.334 kg (174 lb 14.4 oz)    SpO2: 97% 97% 98% 99%    General: Awake, in nad Cardiovascular: regular, s1, s2 Respiratory: normal resp effort, no wheezing  Discharge Instructions     Medication List    TAKE these medications        acetaminophen 325 MG tablet  Commonly known as:  TYLENOL  Take 650 mg by mouth every 6 (six) hours as needed for pain.     ADVAIR DISKUS 250-50 MCG/DOSE Aepb  Generic drug:  Fluticasone-Salmeterol  Inhale 1 puff into the lungs daily as needed. For shortness of breath     aspirin EC 81 MG tablet  Take 81 mg by  mouth daily.     atorvastatin 40 MG tablet  Commonly known as:  LIPITOR  Take 40 mg by mouth daily.     CALCIUM 600 600 MG Tabs tablet  Generic drug:  calcium carbonate  Take 600 mg by mouth daily with breakfast.     carbidopa-levodopa 25-100 MG per tablet  Commonly known as:  SINEMET IR  1.5 tablets in the morning and evening, one tablet at midday and at dinnertime     carvedilol 6.25 MG tablet  Commonly known as:  COREG  Take 6.25 mg by mouth 2 (two) times daily with a meal.     CENTRUM SILVER PO  Take 1 tablet by mouth daily.     cholestyramine 4 G packet   Commonly known as:  QUESTRAN  TAKE 1 PACKET BY MOUTH TWICE DAILY WITH A MEAL     fluorouracil 5 % cream  Commonly known as:  EFUDEX  Apply 1 application topically daily as needed. For fungus     furosemide 20 MG tablet  Commonly known as:  LASIX  Take 1 tablet (20 mg total) by mouth daily.  Start taking on:  11/15/2014     ipratropium 0.03 % nasal spray  Commonly known as:  ATROVENT  Place 2 sprays into both nostrils every 12 (twelve) hours.     isosorbide mononitrate 30 MG 24 hr tablet  Commonly known as:  IMDUR  TAKE ONE TABLET EACH DAY     itraconazole 100 MG capsule  Commonly known as:  SPORANOX  Take 100 mg by mouth 2 (two) times daily.     loperamide 2 MG capsule  Commonly known as:  IMODIUM  Take 2 mg by mouth daily as needed for diarrhea or loose stools.     midodrine 2.5 MG tablet  Commonly known as:  PROAMATINE  Take 1 tablet (2.5 mg total) by mouth 2 (two) times daily with a meal.     mupirocin ointment 2 %  Commonly known as:  BACTROBAN  Apply 1 application topically daily as needed (skin irritation).     nitroGLYCERIN 0.4 MG SL tablet  Commonly known as:  NITROSTAT  Place 0.4 mg under the tongue every 5 (five) minutes as needed for chest pain.     pantoprazole 40 MG tablet  Commonly known as:  PROTONIX  Take 1 tablet by mouth 2 (two) times daily.     potassium chloride SA 20 MEQ tablet  Commonly known as:  K-DUR,KLOR-CON  Take 20 mEq by mouth daily.     tamsulosin 0.4 MG Caps capsule  Commonly known as:  FLOMAX  Take 0.4 mg by mouth daily.     vitamin B-12 1000 MCG tablet  Commonly known as:  CYANOCOBALAMIN  Take 1,000 mcg by mouth daily.     Vitamin D-3 1000 UNITS Caps  Take 1,000 Units by mouth daily.       No Known Allergies Follow-up Information    Follow up with Melina Copa, PA-C.   Specialties:  Cardiology, Radiology   Why:  [facility] - NOTE: this will be at the [facility]. 11/23/14 at 9:30am.   Contact information:    [address] [place] [address] [phone]       Follow up with [name], MD. Schedule an appointment as soon as possible for a visit in 1 week.   Specialty:  Internal Medicine   Why:  Hospital follow up   Contact information:   [address]  [place] [address] [phone]        The results of significant diagnostics from this hospitalization (including imaging, microbiology, ancillary and laboratory) are listed below for reference.    Significant Diagnostic Studies: Dg Chest 2 View  11/10/2014   CLINICAL DATA:  Weakness, hypotension, syncope  EXAM: CHEST  2 VIEW  COMPARISON:  09/05/2014  FINDINGS: The heart size is mildly enlarged, stable. Both lungs are clear. The visualized skeletal structures are unremarkable.  IMPRESSION: No active cardiopulmonary disease.   Electronically Signed   By: Conchita Paris M.D.   On: 11/10/2014 19:55   Ct Angio Chest Pe W/cm &/or Wo Cm  11/10/2014   CLINICAL DATA:  Weakness lightheadedness and hypotension.  EXAM: CT ANGIOGRAPHY CHEST WITH CONTRAST  TECHNIQUE: Multidetector CT imaging of the chest was performed using the standard protocol during bolus administration of intravenous contrast. Multiplanar CT image reconstructions and MIPs were obtained to evaluate the vascular anatomy.  CONTRAST:  24m OMNIPAQUE IOHEXOL 350 MG/ML SOLN  COMPARISON:  09/05/2014  FINDINGS: Cardiovascular: There is good opacification of the pulmonary arteries. There is no pulmonary embolism. The thoracic aorta is normal in caliber and intact.  Lungs: Clear except for minimal atelectatic appearing peripheral basilar opacity on the left, reduced from 09/05/2014.  Central airways: Patent  Effusions: None  Lymphadenopathy: None  Esophagus: Unremarkable  Upper abdomen: No significant abnormality  Musculoskeletal: Moderate degenerative disc changes. No significant abnormality.  Review of the MIP images confirms the above findings.  IMPRESSION: Negative  for acute pulmonary embolism or other acute abnormality in the chest. Minimal atelectatic appearing peripheral basilar opacities on the left.   Electronically Signed   By: DAndreas NewportM.D.   On: 11/10/2014 22:42    Microbiology: Recent Results (from the past 240 hour(s))  MRSA PCR Screening     Status: None   Collection Time: 11/11/14  1:37 AM  Result Value Ref Range Status   MRSA by PCR NEGATIVE NEGATIVE Final    Comment:        The GeneXpert MRSA Assay (FDA approved for NASAL specimens only), is one component of a comprehensive MRSA colonization surveillance program. It is not intended to diagnose MRSA infection nor to guide or monitor treatment for MRSA infections.      Labs: Basic Metabolic Panel:  Recent Labs Lab 11/10/14 1933 11/11/14 0710 11/12/14 1108 11/13/14 0700 11/14/14 0413  NA 139 138 132* 138 136  K 4.6 3.3* 3.5 3.3* 3.3*  CL 104 98* 97* 101 96*  CO2 27 28 26 28 29   GLUCOSE 109* 107* 120* 99 96  BUN 11 8 11 8 8   CREATININE 1.16 1.09 0.98 0.95 0.96  CALCIUM 8.6* 8.2* 7.7* 8.0* 8.0*   Liver Function Tests: No results for input(s): AST, ALT, ALKPHOS, BILITOT, PROT, ALBUMIN in the last 168 hours. No results for input(s): LIPASE, AMYLASE in the last 168 hours. No results for input(s): AMMONIA in the last 168 hours. CBC:  Recent Labs Lab 11/10/14 1933  WBC 5.9  NEUTROABS 3.1  HGB 13.9  HCT 41.2  MCV 101.2*  PLT 208   Cardiac Enzymes:  Recent Labs Lab 11/11/14 0238 11/11/14 0710 11/11/14 1358  TROPONINI <0.03 <0.03 <0.03   BNP: BNP (last 3 results)  Recent Labs  09/05/14 1128 11/10/14 1934  BNP 38.8 160.2*    ProBNP (last 3 results) No results for input(s): PROBNP in the last 8760 hours.  CBG: No results for input(s): GLUCAP in the last 168 hours.  Signed:  CHIU, Orpah Melter  [facility] 11/14/2014, 2:36 PM

## 2014-11-14 NOTE — Progress Notes (Signed)
Patient: [name] [name] / Admit Date: 11/10/2014 / Date of Encounter: 11/14/2014, 11:47 AM   Subjective: Feeling much better. LEE much improved.   Objective: Telemetry: NSR, occasional PACs, brief sinus tach Physical Exam: Blood pressure 112/58, pulse 90, temperature 98.2 F (36.8 C), temperature source Oral, resp. rate 18, height 5' 10"  (1.778 m), weight 174 lb 14.4 oz (79.334 kg), SpO2 99 %. General: Well developed, well nourished, in no acute distress. Head: Normocephalic, atraumatic, sclera non-icteric, no xanthomas, nares are without discharge. Neck: JVP not elevated. Lungs: Clear bilaterally to auscultation without wheezes, rales, or rhonchi. Breathing is unlabored. Heart: RRR S1 S2 without murmurs, rubs, or gallops.  Abdomen: Soft, non-tender, non-distended with normoactive bowel sounds. No rebound/guarding. Extremities: No clubbing or cyanosis. Trace bilateral LE edema. Distal pedal pulses are 2+ and equal bilaterally. Neuro: Alert and oriented X 3. Moves all extremities spontaneously. Psych:  Responds to questions appropriately with a normal affect.   Intake/Output Summary (Last 24 hours) at 11/14/14 1147 Last data filed at 11/14/14 1128  Gross per 24 hour  Intake   1540 ml  Output   2501 ml  Net   -961 ml    Inpatient Medications:  . aspirin EC  81 mg Oral Daily  . atorvastatin  40 mg Oral Daily  . calcium carbonate  1 tablet Oral Q breakfast  . carbidopa-levodopa  1 tablet Oral 2 times per day  . carbidopa-levodopa  1.5 tablet Oral 2 times per day  . carvedilol  6.25 mg Oral BID WC  . cholecalciferol  1,000 Units Oral Daily  . cholestyramine  4 g Oral BID WC  . enoxaparin (LOVENOX) injection  40 mg Subcutaneous Q24H  . furosemide  40 mg Intravenous Daily  . isosorbide mononitrate  30 mg Oral Daily  . midodrine  2.5 mg Oral BID WC  . mometasone-formoterol  2 puff Inhalation BID  . pantoprazole  40 mg Oral BID  . [START ON 11/15/2014] potassium chloride SA  20 mEq  Oral Daily  . potassium chloride  40 mEq Oral BID  . sodium chloride  3 mL Intravenous Q12H  . vitamin B-12  1,000 mcg Oral Daily   Infusions:    Labs:  Recent Labs  11/13/14 0700 11/14/14 0413  NA 138 136  K 3.3* 3.3*  CL 101 96*  CO2 28 29  GLUCOSE 99 96  BUN 8 8  CREATININE 0.95 0.96  CALCIUM 8.0* 8.0*    Recent Labs  11/11/14 1358  TROPONINI <0.03   Invalid input(s): POCBNP No results for input(s): HGBA1C in the last 72 hours.   Radiology/Studies:  Dg Chest 2 View  11/10/2014   CLINICAL DATA:  Weakness, hypotension, syncope  EXAM: CHEST  2 VIEW  COMPARISON:  09/05/2014  FINDINGS: The heart size is mildly enlarged, stable. Both lungs are clear. The visualized skeletal structures are unremarkable.  IMPRESSION: No active cardiopulmonary disease.   Electronically Signed   By: Conchita Paris M.D.   On: 11/10/2014 19:55   Ct Angio Chest Pe W/cm &/or Wo Cm  11/10/2014   CLINICAL DATA:  Weakness lightheadedness and hypotension.  EXAM: CT ANGIOGRAPHY CHEST WITH CONTRAST  TECHNIQUE: Multidetector CT imaging of the chest was performed using the standard protocol during bolus administration of intravenous contrast. Multiplanar CT image reconstructions and MIPs were obtained to evaluate the vascular anatomy.  CONTRAST:  84m OMNIPAQUE IOHEXOL 350 MG/ML SOLN  COMPARISON:  09/05/2014  FINDINGS: Cardiovascular: There is good opacification of the pulmonary arteries.  There is no pulmonary embolism. The thoracic aorta is normal in caliber and intact.  Lungs: Clear except for minimal atelectatic appearing peripheral basilar opacity on the left, reduced from 09/05/2014.  Central airways: Patent  Effusions: None  Lymphadenopathy: None  Esophagus: Unremarkable  Upper abdomen: No significant abnormality  Musculoskeletal: Moderate degenerative disc changes. No significant abnormality.  Review of the MIP images confirms the above findings.  IMPRESSION: Negative for acute pulmonary embolism or other  acute abnormality in the chest. Minimal atelectatic appearing peripheral basilar opacities on the left.   Electronically Signed   By: Andreas Newport M.D.   On: 11/10/2014 22:42     Assessment and Plan   1. Acute on chronic diastolic CHF - multifactorial largely from dietary indiscretion with sodium restricted diet and recent steroid use for Crohn's. Morbid obesity is also playing a role in marked LE edema.EF 65-70% with grade 1 DD by echo 11/14/14. Agree he is likely improved enough for DC. I would recommend Lasix 21m daily since he did so well on Lasix 465mIV daily. We can titrate as outpatient if needed, but lower dose may be safer 2/2 h/o orthostasis. Continue KCl 2050mdaily. Salt restriction reinforced - hopefully will be more compliant at SNF. MD needs to cosign this note before he goes. F/u scheduled with me 8/17 at 9:30am in flex clinic for TOC.  2. Remote CAD s/[facility] - no angina. Continue aspirin, BB, statin, Imdur. Would not titrate BB further right now due to #3 having improved. HR appears at baseline.  3. Orthostatic hypotension, likely due to age, autonomic insufficiency (Parkinson's), medication related (Sinemet, alpha blockers), borderline low cortisol. Continue midodrine. Avoid salt loading and Florinef due to #1. May need alternative prostate blocking agent per IM since Tamsulosin was stopped this admission.  4. Crohn's disease - per GI.  Signed, DayMelina Copa-C Pager: [phone] seen and examined. Agree with assessment and plan. Breathing well; no JVD or rales, but still with 2 + pitting LE Edema. Would benefit from compression stocking both for edema and orthostasis. Will send to SNF with oral Lasix and ov in 1 week.   ThoTroy SineD, FACTrinity Hospital - Saint Josephs8/2016 2:08 PM    Patient seen and examined. Agree with assessment and plan.   ThoTroy SineD, FACPhoenix Endoscopy LLC8/2016 1:55 PM

## 2014-11-14 NOTE — Progress Notes (Signed)
  Echocardiogram 2D Echocardiogram has been performed.  Jennette Dubin 11/14/2014, 10:32 AM

## 2014-11-14 NOTE — Care Management Important Message (Signed)
Important Message  Patient Details  Name: [name] [name] MRN: [id number] Date of Birth: [birth year]   Medicare Important Message Given:  Yes-second notification given    Pricilla Handler 11/14/2014, 2:56 PM

## 2014-11-14 NOTE — Progress Notes (Addendum)
Pt has orders to be discharged to [facility]. Discharge instructions given and pt has no additional questions at this time. Medication regimen reviewed and pt educated. Pt verbalized understanding and has no additional questions. Telemetry box removed. IV removed and site in good condition. Pt stable and waiting for transportation.  Report called to Manuela Schwartz at [facility].  Maurene Capes RN

## 2014-11-15 ENCOUNTER — Non-Acute Institutional Stay (SKILLED_NURSING_FACILITY): Payer: Medicare Other | Admitting: Internal Medicine

## 2014-11-15 DIAGNOSIS — I10 Essential (primary) hypertension: Secondary | ICD-10-CM | POA: Diagnosis not present

## 2014-11-15 DIAGNOSIS — R609 Edema, unspecified: Secondary | ICD-10-CM | POA: Diagnosis not present

## 2014-11-15 DIAGNOSIS — G20 Parkinson's disease: Secondary | ICD-10-CM

## 2014-11-15 DIAGNOSIS — I5032 Chronic diastolic (congestive) heart failure: Secondary | ICD-10-CM | POA: Diagnosis not present

## 2014-11-15 DIAGNOSIS — E785 Hyperlipidemia, unspecified: Secondary | ICD-10-CM

## 2014-11-15 DIAGNOSIS — I951 Orthostatic hypotension: Secondary | ICD-10-CM

## 2014-11-15 DIAGNOSIS — K50919 Crohn's disease, unspecified, with unspecified complications: Secondary | ICD-10-CM | POA: Diagnosis not present

## 2014-11-15 DIAGNOSIS — K760 Fatty (change of) liver, not elsewhere classified: Secondary | ICD-10-CM

## 2014-11-15 DIAGNOSIS — K219 Gastro-esophageal reflux disease without esophagitis: Secondary | ICD-10-CM | POA: Diagnosis not present

## 2014-11-15 DIAGNOSIS — I2510 Atherosclerotic heart disease of native coronary artery without angina pectoris: Secondary | ICD-10-CM | POA: Diagnosis not present

## 2014-11-15 DIAGNOSIS — N400 Enlarged prostate without lower urinary tract symptoms: Secondary | ICD-10-CM

## 2014-11-15 DIAGNOSIS — G20A1 Parkinson's disease without dyskinesia, without mention of fluctuations: Secondary | ICD-10-CM

## 2014-11-15 NOTE — Progress Notes (Signed)
[name], male   DOB: February 22, 1929, 82 y.o.   MRN: [id number]  Provider:  Rexene Edison. Mariea Clonts, D.O., C.M.D. Location:  [facility]  PCP: [name], MD  Code Status: DNR Goals of Care: Advanced Directive information Does patient have an advance directive?: Yes, Type of Advance Directive: [place];Living will;Out of facility DNR (pink MOST or yellow form), Pre-existing out of facility DNR order (yellow form or pink MOST form): Yellow form placed in chart (order not valid for inpatient use), Does patient want to make changes to advanced directive?: No - Patient declined   Chief Complaint  Patient presents with  . New Admit To SNF    s/p hospitalization 8/4-11/14/14 with dizziness, dyspnea on exertion, bilateral LE edema, irregular pulse and hypotension    HPI: 81 y.o. male with h/o CAD s/p anterior STEMI 04/2007 with bare metal stent placed to LAD (found to be patient 9/13 w/o any obstructive disease overall, normal myoview 1/16, and chronic dyspnea on exertion with diastolic chf), HTN, hyperlipidemia, Crohn's disease, GERD, and Parkinson's disease was admitted here to [facility] s/p hospitalization from 8/4-11/14/14 with above symptoms.  His hospital records have been reviewed including labs, imaging, notes.  He had been here in rehab in May after a hospitalization and to complete treatment for his UE cellulitis felt at first to be bacterial and treatment with abx, but later seen by derm and put on sporanox 6/24 for fungal infection.   Stop date unclear and staff are contacting [facility]--appears it was continued during his hospitalization though his home medication list from the [facility] does not show it listed.  Later that month, he had a Crohn's flare and was treated with a course of steroids through 10/18/14.    His current problems began when he went to ophthalmology and became dizzy and clammy and was noted to have an irregular heart  beat and be hypotensive.  When he returned here to his [facility] independent living apt, this remained the case and he was sent to the ED.  He was diagnosed with orthostatic hypotension which was treated with midodrine.  He had been drinking poorly.  He had borderline low cortisol also suggesting mild adrenal insufficiency (recent steroid treatment).  He had negative dopplers for DVT, and it was recommended he elevate his legs and wears TEDs during the day.  He also received IV lasix while he was there until just before discharge--he comes here on lasix 97m po daily.   Also, of note, he has Parkinson's so this could be autonomic and he is on flomax which can also contribute to orthostatic hypotension.      His wife notes that he is more sleepy than usual since he was in the hospital.  He's unable to take care of himself at home and she's unable.  He is very weak and in need of PT.    ROS: Review of Systems  Constitutional: Positive for weight loss and malaise/fatigue. Negative for fever, chills and diaphoresis.       Fluid  HENT: Negative for congestion.   Eyes: Negative for blurred vision.  Respiratory: Negative for shortness of breath.        DOE better  Cardiovascular: Negative for chest pain, palpitations and leg swelling.       Edema better  Gastrointestinal: Negative for abdominal pain, constipation, blood in stool and melena.  Genitourinary: Positive for frequency. Negative for dysuria and urgency.  Does have some difficulty starting his stream (on flomax), has NOT seen urology  Musculoskeletal: Positive for falls.       2 falls in the past few months  Skin:       Arms are still discolored where fungal infection was bilaterally  Neurological: Positive for dizziness, tingling, sensory change and weakness. Negative for loss of consciousness and headaches.       On standing and walking; neuropathy in feet; right side cramps on him especially right hand  Psychiatric/Behavioral:  Positive for memory loss. Negative for depression.    Past Medical History  Diagnosis Date  . Hypertension   . Hyperlipidemia   . Coronary artery disease     s/p ANTERIOR STEMI 1/09 at [place]; treated with a bare-metal stent to the LAD; Myoview 11/11 EF 71%, no scar, no ischemia;     cath 12/20/10:   pLAD 30-40%, stent ok, ? If stent oversized, EF 60% and low LVEDP  . GERD (gastroesophageal reflux disease)   . Other esophagitis   . Diaphragmatic hernia without mention of obstruction or gangrene   . Ulcerative (chronic) ileocolitis   . Diverticulosis of colon (without mention of hemorrhage)   . Other chronic nonalcoholic liver disease   . Regional enteritis of small intestine   . Flatulence, eructation, and gas pain   . Ischemic cardiomyopathy     EF initially 35-40% after MI 1/09; echo 7/08: EF 60%  . Fatty liver     on ultrasound of 10/2009  . Parkinson's disease   . Gait disorder   . Memory disorder   . Osteoporosis   . Renal calculi   . Melanoma   . RBBB 09/02/2013  . HOH (hard of hearing)     Hearing aids  . Cellulitis   . Diastolic CHF, acute on chronic 11/11/2014   Past Surgical History  Procedure Laterality Date  . Cholecystectomy    . Appendectomy    . Back surgery      L3, L4, L5  . Tonsillectomy    . Partial bowel resection    . Coronary artery stent placement    . Melanoma resection    . Cataract extraction      Bilateral  . Cardiac catheterization  09/25/06    EF 35-40% but more recently 60%   Social History:   reports that he has never smoked. He has never used smokeless tobacco. He reports that he drinks alcohol. He reports that he does not use illicit drugs.  Family History  Problem Relation Age of Onset  . Coronary artery disease    . Heart failure Mother   . Emphysema Father   . Heart disease Brother   . Heart disease Brother   . Colon cancer Brother     No Known Allergies  Medications: Patient's Medications  New Prescriptions   No  medications on file  Previous Medications   ACETAMINOPHEN (TYLENOL) 325 MG TABLET    Take 650 mg by mouth every 6 (six) hours as needed for pain.   ADVAIR DISKUS 250-50 MCG/DOSE AEPB    Inhale 1 puff into the lungs daily as needed. For shortness of breath   ASPIRIN EC 81 MG TABLET    Take 81 mg by mouth daily.   ATORVASTATIN (LIPITOR) 40 MG TABLET    Take 40 mg by mouth daily.     CALCIUM CARBONATE (CALCIUM 600) 600 MG TABS TABLET    Take 600 mg by mouth daily with breakfast.  CARBIDOPA-LEVODOPA (SINEMET IR) 25-100 MG PER TABLET    1.5 tablets in the morning and evening, one tablet at midday and at dinnertime   CARVEDILOL (COREG) 6.25 MG TABLET    Take 6.25 mg by mouth 2 (two) times daily with a meal.   CHOLECALCIFEROL (VITAMIN D-3) 1000 UNITS CAPS    Take 1,000 Units by mouth daily.   CHOLESTYRAMINE (QUESTRAN) 4 G PACKET    TAKE 1 PACKET BY MOUTH TWICE DAILY WITH A MEAL   FLUOROURACIL (EFUDEX) 5 % CREAM    Apply 1 application topically daily as needed. For fungus   FUROSEMIDE (LASIX) 20 MG TABLET    Take 1 tablet (20 mg total) by mouth daily.   IPRATROPIUM (ATROVENT) 0.03 % NASAL SPRAY    Place 2 sprays into both nostrils every 12 (twelve) hours.   ISOSORBIDE MONONITRATE (IMDUR) 30 MG 24 HR TABLET    TAKE ONE TABLET EACH DAY   ITRACONAZOLE (SPORANOX) 100 MG CAPSULE    Take 100 mg by mouth 2 (two) times daily.    LOPERAMIDE (IMODIUM) 2 MG CAPSULE    Take 2 mg by mouth daily as needed for diarrhea or loose stools.   MIDODRINE (PROAMATINE) 2.5 MG TABLET    Take 1 tablet (2.5 mg total) by mouth 2 (two) times daily with a meal.   MULTIPLE VITAMINS-MINERALS (CENTRUM SILVER PO)    Take 1 tablet by mouth daily.   MUPIROCIN OINTMENT (BACTROBAN) 2 %    Apply 1 application topically daily as needed (skin irritation).    NITROGLYCERIN (NITROSTAT) 0.4 MG SL TABLET    Place 0.4 mg under the tongue every 5 (five) minutes as needed for chest pain.   PANTOPRAZOLE (PROTONIX) 40 MG TABLET    Take 1 tablet by  mouth 2 (two) times daily.   POTASSIUM CHLORIDE SA (K-DUR,KLOR-CON) 20 MEQ TABLET    Take 20 mEq by mouth daily.   TAMSULOSIN (FLOMAX) 0.4 MG CAPS CAPSULE    Take 0.4 mg by mouth daily.   VITAMIN B-12 (CYANOCOBALAMIN) 1000 MCG TABLET    Take 1,000 mcg by mouth daily.  Modified Medications   No medications on file  Discontinued Medications   No medications on file     Physical Exam: Filed Vitals:   11/15/14 0944  BP: 127/80  Pulse: 98  Temp: 98.1 F (36.7 C)  Resp: 16  Height: 5' 9.8" (1.773 m)  Weight: 175 lb 12.8 oz (79.742 kg)  SpO2: 96%   Body mass index is 25.37 kg/(m^2). Physical Exam  Constitutional: He appears well-developed and well-nourished. No distress.  Sleepy during visit; resting in his recliner  HENT:  Head: Normocephalic and atraumatic.  Right Ear: External ear normal.  Left Ear: External ear normal.  Nose: Nose normal.  Mouth/Throat: Oropharynx is clear and moist. No oropharyngeal exudate.  Eyes: Conjunctivae and EOM are normal. Pupils are equal, round, and reactive to light.  Neck: Normal range of motion. Neck supple. No JVD present. No thyromegaly present.  Cardiovascular: Intact distal pulses.   Irregular; edema resolved  Pulmonary/Chest: Effort normal and breath sounds normal. No respiratory distress.  Abdominal: Soft. Bowel sounds are normal. He exhibits no distension. There is no tenderness.  Musculoskeletal: He exhibits no edema or tenderness.  Slight cogwheeling rigidity R>L UE  Lymphadenopathy:    He has no cervical adenopathy.  Neurological: He is alert.  Oriented to person, place, not precise time  Skin: Skin is warm and dry. There is erythema.  Purplish discoloration of  bilateral arms, lumpy appearance  Psychiatric: He has a normal mood and affect.  Poor short term memory     Labs reviewed: Basic Metabolic Panel:  Recent Labs  11/12/14 1108 11/13/14 0700 11/14/14 0413  NA 132* 138 136  K 3.5 3.3* 3.3*  CL 97* 101 96*  CO2 26  28 29   GLUCOSE 120* 99 96  BUN 11 8 8   CREATININE 0.98 0.95 0.96  CALCIUM 7.7* 8.0* 8.0*   Liver Function Tests:  Recent Labs  09/05/14 1128 09/22/14 1533  AST 31 28  ALT 14* 35  ALKPHOS 43 44  BILITOT 2.7* 1.4*  PROT 6.3* 6.0  ALBUMIN 3.5 3.6   No results for input(s): LIPASE, AMYLASE in the last 8760 hours. No results for input(s): AMMONIA in the last 8760 hours. CBC:  Recent Labs  09/05/14 1128 09/22/14 1533 11/10/14 1933  WBC 8.2 9.0 5.9  NEUTROABS 6.4 7.3 3.1  HGB 16.0 14.6 13.9  HCT 48.2 43.9 41.2  MCV 98.6 97.5 101.2*  PLT 104* 184.0 208   Cardiac Enzymes:  Recent Labs  11/11/14 0238 11/11/14 0710 11/11/14 1358  TROPONINI <0.03 <0.03 <0.03   BNP: Invalid input(s): POCBNP  No results found for: HGBA1C Lab Results  Component Value Date   TSH 2.428 11/12/2014   Lab Results  Component Value Date   VITAMINB12 311 06/12/2006   Lab Results  Component Value Date   FOLATE > 20.0 ng/mL 06/12/2006   No results found for: IRON, TIBC, FERRITIN  Imaging and Procedures obtained prior to SNF admission: CXR and CT chest PE protocol performed 11/10/14 were reviewed  Patient Care Team: Shon Baton, MD as PCP - General (Internal Medicine) Jolaine Artist, MD (Cardiology) [name] [name], MD as Consulting Physician (Cardiology) [name], MD as Attending Physician (Gastroenterology) [name], MD as Consulting Physician (Neurology)  Assessment/Plan 1. Orthostatic hypotension -monitor daily -seems multifactorial:  PD, ?adrenal insufficiency/mild, flomax, beta blocker, lasix -unsure if midodrine is best for him with his chf, risk of supine htn -flomax not helping this either, but then again he already has trouble starting his stream and the midodrine can worsen urinary retention -? northera would be better  2. Parkinson's disease - suspect this is the biggest contributor to his orthostatic hypotension -is to use walker at all times, tremor  is minimal but gait is poor -cont PT, OT -his wife says she cannot take care of him at home -cont sinemet as per neurology  3. BPH (benign prostatic hyperplasia) -cont flomax due to difficulty with stream and increased risk of retention with midodrine  4. Chronic diastolic CHF (congestive heart failure) -cont coreg, lasix, kcl -f/u bmp  5. Edema -was felt to be due to chf, but also has NASH and had been on steroids for crohn's flare -was diuresed aggressively and lost 6 lbs of fluid -edema resolved and remains on low dose lasix and potassium supplement  6. Coronary artery disease involving native coronary artery of native heart without angina pectoris -cont statin, beta blocker, prn ntg, [id number], and monitor -no active symptoms  7. Crohn's disease, unspecified complication -was taken off steroids in prep for next remicade infusion -this was felt to induce mild adrenal insufficiency with borderline low cortisol level, but he has not gotten his remicade nor has he gotten any steroids -cont questran and imodium prn  8. Fatty liver disease, nonalcoholic -unclear how severe this is -will check liver panel and ammonia level to evaluate (is also sleepy in  the daytime so may be due to hepatic encephalopathy)  9. Essential hypertension -bps have not been elevated so far -cont to monitor supine bps and orthostatics through friday  10. Hyperlipidemia -cont statin therapy and monitor  11. Gastroesophageal reflux disease, esophagitis presence not specified -continues on protonix  Functional status:  Currently weak, requiring help with bathing, and some prompting for other adls, but able to do them; not safe with med mgt, driving, finances at this point  Family/ staff Communication: discussed with his wife, rehab nurse, nurse manager  Labs/tests ordered:  Liver panel, ammonia level, bmp  Kara Melching L. Dream Nodal, D.O. [facility] [address], [place] [address] Cell Phone (Mon-Fri 8am-5pm):  [phone] On Call:  [phone] & follow prompts after 5pm & weekends Office Phone:  [phone] Office Fax:  [phone]

## 2014-11-16 ENCOUNTER — Encounter: Payer: Self-pay | Admitting: Internal Medicine

## 2014-11-17 LAB — BASIC METABOLIC PANEL
BUN: 8 mg/dL (ref 4–21)
Creatinine: 0.9 mg/dL (ref 0.6–1.3)
Glucose: 97 mg/dL
Potassium: 4.7 mmol/L (ref 3.4–5.3)
Sodium: 132 mmol/L — AB (ref 137–147)

## 2014-11-18 ENCOUNTER — Non-Acute Institutional Stay (SKILLED_NURSING_FACILITY): Payer: Medicare Other | Admitting: Adult Health

## 2014-11-18 ENCOUNTER — Encounter: Payer: Self-pay | Admitting: Adult Health

## 2014-11-18 DIAGNOSIS — E785 Hyperlipidemia, unspecified: Secondary | ICD-10-CM | POA: Diagnosis not present

## 2014-11-18 DIAGNOSIS — I10 Essential (primary) hypertension: Secondary | ICD-10-CM

## 2014-11-18 DIAGNOSIS — I5031 Acute diastolic (congestive) heart failure: Secondary | ICD-10-CM | POA: Diagnosis not present

## 2014-11-18 DIAGNOSIS — G20A1 Parkinson's disease without dyskinesia, without mention of fluctuations: Secondary | ICD-10-CM

## 2014-11-18 DIAGNOSIS — B49 Unspecified mycosis: Secondary | ICD-10-CM

## 2014-11-18 DIAGNOSIS — R609 Edema, unspecified: Secondary | ICD-10-CM | POA: Diagnosis not present

## 2014-11-18 DIAGNOSIS — K7581 Nonalcoholic steatohepatitis (NASH): Secondary | ICD-10-CM | POA: Diagnosis not present

## 2014-11-18 DIAGNOSIS — I951 Orthostatic hypotension: Secondary | ICD-10-CM

## 2014-11-18 DIAGNOSIS — K50019 Crohn's disease of small intestine with unspecified complications: Secondary | ICD-10-CM

## 2014-11-18 DIAGNOSIS — I2510 Atherosclerotic heart disease of native coronary artery without angina pectoris: Secondary | ICD-10-CM | POA: Diagnosis not present

## 2014-11-18 DIAGNOSIS — G20 Parkinson's disease: Secondary | ICD-10-CM | POA: Diagnosis not present

## 2014-11-18 DIAGNOSIS — K219 Gastro-esophageal reflux disease without esophagitis: Secondary | ICD-10-CM | POA: Diagnosis not present

## 2014-11-18 NOTE — Progress Notes (Signed)
[name], male   DOB: 03-29-1929, 81 y.o.   MRN: [id number]   Location:  [facility]  PCP: [name], MD  Code Status: DNR      Chief Complaint  Patient presents with  . Discharge Note    HPI: 81 y.o. male with h/o CAD s/p anterior STEMI 04/2007 with bare metal stent placed to LAD (found to be patient 9/13 w/o any obstructive disease overall, normal myoview 1/16, and chronic dyspnea on exertion with diastolic chf), HTN, hyperlipidemia, Crohn's disease, GERD, and Parkinson's disease was admitted here to [facility] s/p hospitalization from 8/4-11/14/14 due to orthostatic hypotension.      He had been here in rehab in May after a hospitalization and to complete treatment for his UE cellulitis felt at first to be bacterial and treatment with abx, but later seen by derm and put on sporanox 6/24 for fungal infection and then later stopped with an unclear date. He was seen by derm yesterday and due to recurrent plaques and redness he was restarted sporanox and naftin cream with follow up in 3 weeks.   He was treated for orthostatic hypotension in the hospital and started on midodrine with improvement in symptoms upon discharge. He did experience dizziness yesterday at his derm apt. His blood pressures have not been orthostatic for the most part but he has some tachycardia (rate 88-107).  Also, of note, he has Parkinson's so this could be autonomic and he is on flomax which can also contribute to orthostatic hypotension. There is a contraindication of flomax with sporanox so this will be discontinued. He denies any difficulty urinating at this time but does have periods of incontinence chronically.   He fell on 11/18/14 and obtained a skin tear to his left arm.  He reports that he tripped while walking with his pants down but did not have syncope.   He is now walking with a walker with therapy and has gained strength since admission.   He has a hx of crohn's dz but  has missed recent remicade tx due to his fungal infection tx. He denies diarrhea, bloody stool or abd pain. He has a hx of nash and his ammonia level today is 62.  He is alert with no symptoms related to this issue.   ROS: Review of Systems  Constitutional: Negative for fever, chills, malaise/fatigue and diaphoresis.  HENT: Negative for congestion.   Eyes: Negative for blurred vision.  Respiratory: Negative for shortness of breath.        DOE better  Cardiovascular: Negative for chest pain, palpitations and leg swelling.       Edema better  Gastrointestinal: Negative for nausea, vomiting, abdominal pain, constipation, blood in stool and melena.  Genitourinary: Negative for dysuria, urgency and frequency.       Intermittent incontinence  Musculoskeletal: Positive for falls.       3 falls in the past few months  Skin:       Arms are still discolored where fungal infection was bilaterally  Neurological: Positive for dizziness. Negative for loss of consciousness, weakness and headaches.       On standing and walking; neuropathy in feet; right side cramps on him especially right hand  Psychiatric/Behavioral: Positive for memory loss. Negative for depression.    Past Medical History  Diagnosis Date  . Hypertension   . Hyperlipidemia   . Coronary artery disease     s/p ANTERIOR STEMI 1/09 at [facility]; treated with a  bare-metal stent to the LAD; Myoview 11/11 EF 71%, no scar, no ischemia;     cath 12/20/10:   pLAD 30-40%, stent ok, ? If stent oversized, EF 60% and low LVEDP  . GERD (gastroesophageal reflux disease)   . Other esophagitis   . Diaphragmatic hernia without mention of obstruction or gangrene   . Ulcerative (chronic) ileocolitis   . Diverticulosis of colon (without mention of hemorrhage)   . Other chronic nonalcoholic liver disease   . Regional enteritis of small intestine   . Flatulence, eructation, and gas pain   . Ischemic cardiomyopathy     EF initially 35-40% after MI  1/09; echo 7/08: EF 60%  . Fatty liver     on ultrasound of 10/2009  . Parkinson's disease   . Gait disorder   . Memory disorder   . Osteoporosis   . Renal calculi   . Melanoma   . RBBB 09/02/2013  . HOH (hard of hearing)     Hearing aids  . Cellulitis   . Diastolic CHF, acute on chronic 11/11/2014   Past Surgical History  Procedure Laterality Date  . Cholecystectomy    . Appendectomy    . Back surgery      L3, L4, L5  . Tonsillectomy    . Partial bowel resection    . Coronary artery stent placement    . Melanoma resection    . Cataract extraction      Bilateral  . Cardiac catheterization  09/25/06    EF 35-40% but more recently 60%   Social History:   reports that he has never smoked. He has never used smokeless tobacco. He reports that he drinks alcohol. He reports that he does not use illicit drugs.  Family History  Problem Relation Age of Onset  . Coronary artery disease    . Heart failure Mother   . Emphysema Father   . Heart disease Brother   . Heart disease Brother   . Colon cancer Brother     No Known Allergies  Medications: Patient's Medications  New Prescriptions   No medications on file  Previous Medications   ACETAMINOPHEN (TYLENOL) 325 MG TABLET    Take 650 mg by mouth every 6 (six) hours as needed for pain.   ADVAIR DISKUS 250-50 MCG/DOSE AEPB    Inhale 1 puff into the lungs daily as needed. For shortness of breath   ASPIRIN EC 81 MG TABLET    Take 81 mg by mouth daily.   CALCIUM CARBONATE (CALCIUM 600) 600 MG TABS TABLET    Take 600 mg by mouth daily with breakfast.   CARBIDOPA-LEVODOPA (SINEMET IR) 25-100 MG PER TABLET    1.5 tablets in the morning and evening, one tablet at midday and at dinnertime   CARVEDILOL (COREG) 6.25 MG TABLET    Take 6.25 mg by mouth 2 (two) times daily with a meal.   CHOLECALCIFEROL (VITAMIN D-3) 1000 UNITS CAPS    Take 1,000 Units by mouth daily.   CHOLESTYRAMINE (QUESTRAN) 4 G PACKET    TAKE 1 PACKET BY MOUTH TWICE DAILY  WITH A MEAL   FLUOROURACIL (EFUDEX) 5 % CREAM    Apply 1 application topically daily as needed. For fungus   FUROSEMIDE (LASIX) 20 MG TABLET    Take 1 tablet (20 mg total) by mouth daily.   IPRATROPIUM (ATROVENT) 0.03 % NASAL SPRAY    Place 2 sprays into both nostrils every 12 (twelve) hours.   ISOSORBIDE MONONITRATE (IMDUR) 30  MG 24 HR TABLET    TAKE ONE TABLET EACH DAY   ITRACONAZOLE (SPORANOX) 100 MG CAPSULE    Take 200 mg by mouth daily.    LOPERAMIDE (IMODIUM) 2 MG CAPSULE    Take 2 mg by mouth daily as needed for diarrhea or loose stools.   MIDODRINE (PROAMATINE) 2.5 MG TABLET    Take 1 tablet (2.5 mg total) by mouth 2 (two) times daily with a meal.   MULTIPLE VITAMINS-MINERALS (CENTRUM SILVER PO)    Take 1 tablet by mouth daily.   MUPIROCIN OINTMENT (BACTROBAN) 2 %    Apply 1 application topically daily as needed (skin irritation).    NAFTIFINE (NAFTIN) 1 % CREAM    Apply 1 application topically 2 (two) times daily.   NITROGLYCERIN (NITROSTAT) 0.4 MG SL TABLET    Place 0.4 mg under the tongue every 5 (five) minutes as needed for chest pain.   PANTOPRAZOLE (PROTONIX) 40 MG TABLET    Take 1 tablet by mouth 2 (two) times daily.   POTASSIUM CHLORIDE SA (K-DUR,KLOR-CON) 20 MEQ TABLET    Take 20 mEq by mouth daily.   VITAMIN B-12 (CYANOCOBALAMIN) 1000 MCG TABLET    Take 1,000 mcg by mouth daily.  Modified Medications   No medications on file  Discontinued Medications   ATORVASTATIN (LIPITOR) 40 MG TABLET    Take 40 mg by mouth daily.     TAMSULOSIN (FLOMAX) 0.4 MG CAPS CAPSULE    Take 0.4 mg by mouth daily.     Physical Exam: Lying BP 143/88, HR 88 Sitting BP 155/87 HR 100 Standing 147/84 HR 105  Physical Exam  Constitutional: He is oriented to person, place, and time. He appears well-developed and well-nourished. No distress.  Sleepy during visit; resting in his recliner  HENT:  Head: Normocephalic and atraumatic.  Right Ear: External ear normal.  Left Ear: External ear normal.    Nose: Nose normal.  Mouth/Throat: Oropharynx is clear and moist. No oropharyngeal exudate.  Eyes: Conjunctivae and EOM are normal. Pupils are equal, round, and reactive to light.  Neck: Normal range of motion. Neck supple. No JVD present. No thyromegaly present.  Cardiovascular: Intact distal pulses.   Irregular; edema resolved  Pulmonary/Chest: Effort normal and breath sounds normal. No respiratory distress.  Abdominal: Soft. Bowel sounds are normal. He exhibits no distension. There is no tenderness.  Musculoskeletal: He exhibits no edema or tenderness.  Stable gait with walker  Lymphadenopathy:    He has no cervical adenopathy.  Neurological: He is alert and oriented to person, place, and time.  Skin: Skin is warm and dry. There is erythema.  Right lower arm with erythema.  Ruddy complexion. Skin tear dressed to left upper arm   Psychiatric: He has a normal mood and affect.  Poor short term memory     Labs reviewed: Basic Metabolic Panel:  Recent Labs  11/12/14 1108 11/13/14 0700 11/14/14 0413 11/17/14  NA 132* 138 136 132*  K 3.5 3.3* 3.3* 4.7  CL 97* 101 96*  --   CO2 26 28 29   --   GLUCOSE 120* 99 96  --   BUN 11 8 8 8   CREATININE 0.98 0.95 0.96 0.9  CALCIUM 7.7* 8.0* 8.0*  --    Liver Function Tests:  Recent Labs  09/05/14 1128 09/22/14 1533  AST 31 28  ALT 14* 35  ALKPHOS 43 44  BILITOT 2.7* 1.4*  PROT 6.3* 6.0  ALBUMIN 3.5 3.6   No results for  input(s): LIPASE, AMYLASE in the last 8760 hours. No results for input(s): AMMONIA in the last 8760 hours. CBC:  Recent Labs  09/05/14 1128 09/22/14 1533 11/10/14 1933  WBC 8.2 9.0 5.9  NEUTROABS 6.4 7.3 3.1  HGB 16.0 14.6 13.9  HCT 48.2 43.9 41.2  MCV 98.6 97.5 101.2*  PLT 104* 184.0 208   Cardiac Enzymes:  Recent Labs  11/11/14 0238 11/11/14 0710 11/11/14 1358  TROPONINI <0.03 <0.03 <0.03   BNP: Invalid input(s): POCBNP  No results found for: HGBA1C Lab Results  Component Value Date    TSH 2.428 11/12/2014   Lab Results  Component Value Date   VITAMINB12 311 06/12/2006   Lab Results  Component Value Date   FOLATE > 20.0 ng/mL 06/12/2006   No results found for: IRON, TIBC, FERRITIN  Imaging and Procedures obtained prior to SNF admission: CXR and CT chest PE protocol performed 11/10/14 were reviewed  11/17/14: ammonia 62  Patient Care Team: Shon Baton, MD as PCP - General (Internal Medicine) Jolaine Artist, MD (Cardiology) [name] [name], MD as Consulting Physician (Cardiology) [name], MD as Attending Physician (Gastroenterology) [name], MD as Consulting Physician (Neurology)  Assessment/Plan 1. Orthostatic hypotension -improved with one episode of dizziness while out at derm apt -seems multifactorial:  PD, ?adrenal insufficiency/mild, flomax, beta blocker, lasix -unsure if midodrine is best for him with his chf, risk of supine htn -will d/c flomax due to med interaction with sporanox and side effect  -would consider Northera in the future since it is targeted for people with PD  2. Parkinson's disease - suspect this is the biggest contributor to his orthostatic hypotension -using walker, his fall today was due to tripping on his pants -PT has cleared him to walk on his own at this point -cont sinemet as per neurology  3. BPH (benign prostatic hyperplasia) -Flomax on hold now due to sporanox therapy per pharm recommendation -denies urinary symptoms at present -will need f/u regarding this issue -since flomax can cause orthostatic hypotension it will be interesting to see if his symptoms improve  4. Chronic diastolic CHF (congestive heart failure) -cont coreg, lasix, kcl -f/u with Na of 132, will need follow up with Dr. Virgina Jock  5. Edema -was felt to be due to chf, but also has NASH and had been on steroids for crohn's flare -was diuresed aggressively and lost 6 lbs of fluid in the hospital -edema resolved and remains on low dose  lasix and potassium supplement -continue current regimen  6. Coronary artery disease involving native coronary artery of native heart without angina pectoris -statin on hold due to antifungal therapy, continue beta blocker, prn ntg, [id number], and monitor -no active symptoms  7. Crohn's disease, unspecified complication -was taken off steroids in prep for next remicade infusion -this was felt to induce mild adrenal insufficiency with borderline low cortisol level -cont questran and imodium prn -denies symptoms at present  8. Fatty liver disease, nonalcoholic -elevated ammonia level today, will given one dose of lactulose and f/u with pcp -most recent lfts wnl -he was instructed to avoid ETOH  9. Essential hypertension -BP stable  10. Hyperlipidemia -statin on hold while on sporanox  11. Gastroesophageal reflux disease, esophagitis presence not specified -continues on protonix  12. Fungal infection to the right arm -followed by derm -currently on sporanox and naftifine cream -f/u with derm in 3 weeks  He is requesting to d/c from rehab to home because he is not able to sleep  here.  He has been released to walk without assistance per PT (with his walker).  I wanted him to d/c on Monday 8/15 but he would rather go home on Sunday since he has an apt with Dr. Virgina Jock on Monday.  I agreed with him as long as he can meet the following parameters:  No dizziness, orthostasis, abnormal VS, or difficulty urinating.   This discharge required >30 min in counseling and coordination of care.    Cindi Carbon, ANP [facility] [phone]

## 2014-11-20 DIAGNOSIS — K7581 Nonalcoholic steatohepatitis (NASH): Secondary | ICD-10-CM | POA: Insufficient documentation

## 2014-11-20 DIAGNOSIS — B49 Unspecified mycosis: Secondary | ICD-10-CM | POA: Insufficient documentation

## 2014-11-21 ENCOUNTER — Non-Acute Institutional Stay (SKILLED_NURSING_FACILITY): Payer: Medicare Other | Admitting: Adult Health

## 2014-11-21 ENCOUNTER — Encounter: Payer: Self-pay | Admitting: Adult Health

## 2014-11-21 DIAGNOSIS — R609 Edema, unspecified: Secondary | ICD-10-CM

## 2014-11-21 DIAGNOSIS — R110 Nausea: Secondary | ICD-10-CM

## 2014-11-21 DIAGNOSIS — I5031 Acute diastolic (congestive) heart failure: Secondary | ICD-10-CM

## 2014-11-21 DIAGNOSIS — I951 Orthostatic hypotension: Secondary | ICD-10-CM | POA: Diagnosis not present

## 2014-11-21 NOTE — Progress Notes (Signed)
[name], male   DOB: 1928-07-17, 81 y.o.   MRN: [id number]   Location:  [facility]  PCP: [name] Reel, MD  Code Status: DNR      Chief Complaint  Patient presents with  . Acute Visit    dizziness    HPI: 81 y.o. male with h/o CAD s/p anterior STEMI 04/2007 with bare metal stent placed to LAD (found to be patient 9/13 w/o any obstructive disease overall, normal myoview 1/16, and chronic dyspnea on exertion with diastolic chf), HTN, hyperlipidemia, Crohn's disease, GERD, and Parkinson's disease was admitted here to [facility] s/p hospitalization from 8/4-11/14/14 due to orthostatic hypotension.      He had been here in rehab in May after a hospitalization and to complete treatment for his UE cellulitis felt at first to be bacterial and treatment with abx, but later seen by derm and put on sporanox 6/24 for fungal infection and then later stopped with an unclear date. He was seen by derm yesterday and due to recurrent plaques and redness he was restarted sporanox and naftin cream with follow up in 3 weeks.   He was treated for orthostatic hypotension in the hospital and started on midodrine with improvement in symptoms upon discharge. He experience dizziness over the weekend here at [facility] (x2) with pale skin color in the dining room. His BP was checked later and was 103/62 after he was supine with legs elevated.  His weights have been variable and are difficult to assess, currently at 183 lbs. His HR has ranged 80-101.  He is eating and drinking fairly well but experiencing some nausea, possibly due to the sporanox.   His daughter, [name] [name],  is married to a Teacher, adult education and they are concerned that the coreg is causing edema and causing the orthostasis.   ROS: Review of Systems  Constitutional: Negative for fever, chills, malaise/fatigue and diaphoresis.  HENT: Negative for congestion.   Eyes: Negative for blurred vision.  Respiratory: Negative for shortness  of breath.        DOE better  Cardiovascular: Negative for chest pain, palpitations and leg swelling.       Edema better  Gastrointestinal: Negative for nausea, vomiting, abdominal pain, constipation, blood in stool and melena.  Genitourinary: Negative for dysuria, urgency and frequency.       Intermittent incontinence  Musculoskeletal: Positive for falls.       3 falls in the past few months  Skin:       Arms are still discolored where fungal infection was bilaterally  Neurological: Positive for dizziness. Negative for loss of consciousness, weakness and headaches.       On standing and walking; neuropathy in feet; right side cramps on him especially right hand  Psychiatric/Behavioral: Positive for memory loss. Negative for depression.    Past Medical History  Diagnosis Date  . Hypertension   . Hyperlipidemia   . Coronary artery disease     s/p ANTERIOR STEMI 1/09 at [facility]; treated with a bare-metal stent to the LAD; Myoview 11/11 EF 71%, no scar, no ischemia;     cath 12/20/10:   pLAD 30-40%, stent ok, ? If stent oversized, EF 60% and low LVEDP  . GERD (gastroesophageal reflux disease)   . Other esophagitis   . Diaphragmatic hernia without mention of obstruction or gangrene   . Ulcerative (chronic) ileocolitis   . Diverticulosis of colon (without mention of hemorrhage)   . Other chronic nonalcoholic liver disease   .  Regional enteritis of small intestine   . Flatulence, eructation, and gas pain   . Ischemic cardiomyopathy     EF initially 35-40% after MI 1/09; echo 7/08: EF 60%  . Fatty liver     on ultrasound of 10/2009  . Parkinson's disease   . Gait disorder   . Memory disorder   . Osteoporosis   . Renal calculi   . Melanoma   . RBBB 09/02/2013  . HOH (hard of hearing)     Hearing aids  . Cellulitis   . Diastolic CHF, acute on chronic 11/11/2014   Past Surgical History  Procedure Laterality Date  . Cholecystectomy    . Appendectomy    . Back surgery      L3, L4,  L5  . Tonsillectomy    . Partial bowel resection    . Coronary artery stent placement    . Melanoma resection    . Cataract extraction      Bilateral  . Cardiac catheterization  09/25/06    EF 35-40% but more recently 60%   Social History:   reports that he has never smoked. He has never used smokeless tobacco. He reports that he drinks alcohol. He reports that he does not use illicit drugs.  Family History  Problem Relation Age of Onset  . Coronary artery disease    . Heart failure Mother   . Emphysema Father   . Heart disease Brother   . Heart disease Brother   . Colon cancer Brother     No Known Allergies  Medications: Patient's Medications  New Prescriptions   No medications on file  Previous Medications   ACETAMINOPHEN (TYLENOL) 325 MG TABLET    Take 650 mg by mouth every 6 (six) hours as needed for pain.   ADVAIR DISKUS 250-50 MCG/DOSE AEPB    Inhale 1 puff into the lungs daily as needed. For shortness of breath   ASPIRIN EC 81 MG TABLET    Take 81 mg by mouth daily.   CALCIUM CARBONATE (CALCIUM 600) 600 MG TABS TABLET    Take 600 mg by mouth daily with breakfast.   CARBIDOPA-LEVODOPA (SINEMET IR) 25-100 MG PER TABLET    1.5 tablets in the morning and evening, one tablet at midday and at dinnertime   CARVEDILOL (COREG) 3.125 MG TABLET    Take 3.125 mg by mouth 2 (two) times daily with a meal.   CHOLECALCIFEROL (VITAMIN D-3) 1000 UNITS CAPS    Take 1,000 Units by mouth daily.   CHOLESTYRAMINE (QUESTRAN) 4 G PACKET    TAKE 1 PACKET BY MOUTH TWICE DAILY WITH A MEAL   FLUOROURACIL (EFUDEX) 5 % CREAM    Apply 1 application topically daily as needed. For fungus   FUROSEMIDE (LASIX) 20 MG TABLET    Take 1 tablet (20 mg total) by mouth daily.   IPRATROPIUM (ATROVENT) 0.03 % NASAL SPRAY    Place 2 sprays into both nostrils every 12 (twelve) hours.   ISOSORBIDE MONONITRATE (IMDUR) 30 MG 24 HR TABLET    TAKE ONE TABLET EACH DAY   ITRACONAZOLE (SPORANOX) 100 MG CAPSULE    Take 200  mg by mouth daily.    LOPERAMIDE (IMODIUM) 2 MG CAPSULE    Take 2 mg by mouth daily as needed for diarrhea or loose stools.   MIDODRINE (PROAMATINE) 2.5 MG TABLET    Take 1 tablet (2.5 mg total) by mouth 2 (two) times daily with a meal.   MULTIPLE VITAMINS-MINERALS (CENTRUM SILVER  PO)    Take 1 tablet by mouth daily.   MUPIROCIN OINTMENT (BACTROBAN) 2 %    Apply 1 application topically daily as needed (skin irritation).    NAFTIFINE (NAFTIN) 1 % CREAM    Apply 1 application topically 2 (two) times daily.   NITROGLYCERIN (NITROSTAT) 0.4 MG SL TABLET    Place 0.4 mg under the tongue every 5 (five) minutes as needed for chest pain.   PANTOPRAZOLE (PROTONIX) 40 MG TABLET    Take 1 tablet by mouth 2 (two) times daily.   POTASSIUM CHLORIDE SA (K-DUR,KLOR-CON) 20 MEQ TABLET    Take 20 mEq by mouth daily.   VITAMIN B-12 (CYANOCOBALAMIN) 1000 MCG TABLET    Take 1,000 mcg by mouth daily.  Modified Medications   No medications on file  Discontinued Medications   CARVEDILOL (COREG) 6.25 MG TABLET    Take 6.25 mg by mouth 2 (two) times daily with a meal.     Physical Exam: Filed Vitals:   11/21/14 1436  BP: 155/81  Pulse: 101  Temp: 97.8 F (36.6 C)  Resp: 18  Weight: 183 lb (83.008 kg)    Physical Exam  Constitutional: He is oriented to person, place, and time. He appears well-developed and well-nourished. No distress.  HENT:  Head: Normocephalic and atraumatic.  Neck: Normal range of motion. Neck supple. No JVD present. No thyromegaly present.  Cardiovascular: Intact distal pulses.   Irregular; trace edema  Pulmonary/Chest: Effort normal and breath sounds normal. No respiratory distress.  Abdominal: Soft. Bowel sounds are normal. There is no tenderness.  Rotund abd, firm, at baseline per family  Musculoskeletal: He exhibits no edema or tenderness.  Stable gait with walker  Lymphadenopathy:    He has no cervical adenopathy.  Neurological: He is alert and oriented to person, place, and  time.  Skin: Skin is warm and dry. There is erythema.  Right lower arm with erythema.  Ruddy complexion. Skin tear dressed to left upper arm   Psychiatric: He has a normal mood and affect.  Poor short term memory     Labs reviewed: Basic Metabolic Panel:  Recent Labs  11/12/14 1108 11/13/14 0700 11/14/14 0413 11/17/14  NA 132* 138 136 132*  K 3.5 3.3* 3.3* 4.7  CL 97* 101 96*  --   CO2 26 28 29   --   GLUCOSE 120* 99 96  --   BUN 11 8 8 8   CREATININE 0.98 0.95 0.96 0.9  CALCIUM 7.7* 8.0* 8.0*  --    Liver Function Tests:  Recent Labs  09/05/14 1128 09/22/14 1533  AST 31 28  ALT 14* 35  ALKPHOS 43 44  BILITOT 2.7* 1.4*  PROT 6.3* 6.0  ALBUMIN 3.5 3.6   No results for input(s): LIPASE, AMYLASE in the last 8760 hours. No results for input(s): AMMONIA in the last 8760 hours. CBC:  Recent Labs  09/05/14 1128 09/22/14 1533 11/10/14 1933  WBC 8.2 9.0 5.9  NEUTROABS 6.4 7.3 3.1  HGB 16.0 14.6 13.9  HCT 48.2 43.9 41.2  MCV 98.6 97.5 101.2*  PLT 104* 184.0 208   Cardiac Enzymes:  Recent Labs  11/11/14 0238 11/11/14 0710 11/11/14 1358  TROPONINI <0.03 <0.03 <0.03   BNP: Invalid input(s): POCBNP  No results found for: HGBA1C Lab Results  Component Value Date   TSH 2.428 11/12/2014   Lab Results  Component Value Date   VITAMINB12 311 06/12/2006   Lab Results  Component Value Date   FOLATE > 20.0  ng/mL 06/12/2006   No results found for: IRON, TIBC, FERRITIN  Imaging and Procedures obtained prior to SNF admission: CXR and CT chest PE protocol performed 11/10/14 were reviewed  11/17/14: ammonia 62  Patient Care Team: Shon Baton, MD as PCP - General (Internal Medicine) Jolaine Artist, MD (Cardiology) [name] [name], MD as Consulting Physician (Cardiology) [name], MD as Attending Physician (Gastroenterology) [name], MD as Consulting Physician (Neurology)  Assessment/Plan 1. Orthostatic hypotension -two episodes of  dizziness/low BP over the weekend -seems multifactorial:  PD, ?adrenal insufficiency/mild, beta blocker, lasix -unsure if midodrine is best for him with his chf, risk of supine htn - flomax on hold due to med interaction with sporanox and side effect  -coreg decrease to 3.125 mg due to the above symptoms, with cards f/u later this week, will need to watch HR, his family is concerned that it is part of the reason his BP is low, as well as causing edema -would consider [facility] in the future since it is targeted for people with PD  2. Parkinson's disease - suspect this is the biggest contributor to his orthostatic hypotension -using walker, his fall today was due to tripping on his pants -PT has cleared him to walk on his own at this point -cont sinemet as per neurology  3. BPH (benign prostatic hyperplasia) -Flomax on hold now due to sporanox therapy per pharm recommendation -denies urinary symptoms at present  4. Chronic diastolic CHF (congestive heart failure) -due to low BP and dizziness will hold Lasix for 2 days (NA 132 ) -no edema noted today -long term hx of DOE, I suspect that his rotund abd is causing a restrictive physiology that is a major contributor to this issue -I discussed with his wife and daughter that overall he may need the lasix restarted in the future given his hx of nash and and edema -his weights have been quite variable over all and difficult to assess today -continue to weigh daily and use compression hose -BMP on 8/18 -f/u with cardiology this week  5. Edema -improved, see above  6. Nausea -unclear etiology -may have zofran 4 mg q 8 hrs prn   I spent 45 min on this case in review of his record, meeting with the family , and coordinating care.    Cindi Carbon, ANP [facility] [phone]

## 2014-11-22 ENCOUNTER — Encounter: Payer: Self-pay | Admitting: Physician Assistant

## 2014-11-22 DIAGNOSIS — I5032 Chronic diastolic (congestive) heart failure: Secondary | ICD-10-CM | POA: Insufficient documentation

## 2014-11-22 NOTE — Progress Notes (Addendum)
Cardiology Office Note Date:  11/23/2014  Patient ID:  [name] [name], [name] [name] 1928/06/11, MRN [id number] PCP:  [name], MD  Cardiologist:  Dr. [name]   Chief Complaint: f/u hospitalization for CHF  History of Present Illness: [name] [name] is a 81 y.o. male with history of CAD (s/p anterior STEMI 04/2007 with BMS-> LAD, patent 12/2011, low risk nuc 04/2014), chronic dCHF, HTN, HLD, Crohn's disease, GERD, Parkinson's disease, cellulitis, orthostatic hypotension requiring midodrine, GERD, RBBB, fatty liver, gait disorder, chronic lower extremity edema who presents for post hospital f/u. He was admitted earlier this month with worsening LEE as well as dizziness felt 2/2 orthostatic hypotension in the setting of poor oral intake. His acute on chronic diastolic CHF was felt multifactorial largely from dietary indiscretion with sodium restricted diet and recent steroid use for Crohn's. Morbid obesity was also felt to be playing a role in marked LE edema. His orthostasis was also felt possibly compounded by mild adrenal insufficiency given borderline low cortisol (weaned off steroids 8 weeks prior to admit for Crohn's), age, autonomic insufficiency (Parkinson's), medication related (Sinemet, alpha blockers). Midodrine was continued. Given his CHF, salt loading and Florinef were not advised. LE duplex neg for DVT. Cardiology also recommended TED hose as tolerated + elevation.  He saw PCP at rehab 8/12 at which time he was given lactulose 2/2 elevated ammonia, d/c'd Flomax, held atorvastatin while on itraconazole. BMET 11/17/14 by primary care showed Na 132 (intermittent hyponatremia noted in the past), K 4.7, BUN/Cr 8/0.9. Based on low BP, decreased sodium and dizziness, PCP held Lasix x 2 days and decreased Coreg. They plan repeat BMET 8/18. Hgb 8/4 was 13.9. 2D echo 11/14/14: mild focal basal and mild concentric hypertrophy of septum, LVEF 65-70%, no RWMA, grade 1 DD, mild MR. Nuc 05/06/14: normal, LVEF  normal.  He comes back in today with marked weight gain. Weight 175lb at d/c -> 183lb at PCP office -> 201lb today. LEE still present although improved with compression hose. Abdomen feels slightly distended (as usual per patient) with nausea, poor appetite. No chest pain. C/o feeling low grade dizziness almost constantly. Not really clear if he's been compliant with strict salt restriction at rehab. His wife says after they take away the salt he cannot really find anything that he wants to eat. No syncope.   Past Medical History  Diagnosis Date  . Hypertension   . Hyperlipidemia   . Coronary artery disease     s/p anterior STEMI 04/2007 with BMS-> LAD, patent 12/2011, low risk nuc 04/2014  . GERD (gastroesophageal reflux disease)   . Other esophagitis   . Diaphragmatic hernia without mention of obstruction or gangrene   . Ulcerative (chronic) ileocolitis   . Diverticulosis of colon (without mention of hemorrhage)   . Other chronic nonalcoholic liver disease   . Regional enteritis of small intestine   . Flatulence, eructation, and gas pain   . Ischemic cardiomyopathy     EF initially 35-40% after MI 1/09; echo 7/08: EF 60%  . Fatty liver     on ultrasound of 10/2009  . Parkinson's disease   . Gait disorder   . Memory disorder   . Osteoporosis   . Renal calculi   . Melanoma   . RBBB 09/02/2013  . HOH (hard of hearing)     Hearing aids  . Cellulitis   . Chronic diastolic CHF (congestive heart failure)   . Crohn's disease   . Orthostatic hypotension  Past Surgical History  Procedure Laterality Date  . Cholecystectomy    . Appendectomy    . Back surgery      L3, L4, L5  . Tonsillectomy    . Partial bowel resection    . Coronary artery stent placement    . Melanoma resection    . Cataract extraction      Bilateral  . Cardiac catheterization  09/25/06    EF 35-40% but more recently 60%    Current Outpatient Prescriptions  Medication Sig Dispense Refill  . acetaminophen  (TYLENOL) 325 MG tablet Take 650 mg by mouth every 6 (six) hours as needed for pain.    Marland Kitchen ADVAIR DISKUS 250-50 MCG/DOSE AEPB Inhale 1 puff into the lungs daily as needed. For shortness of breath    . aspirin EC 81 MG tablet Take 81 mg by mouth daily.    . calcium carbonate (CALCIUM 600) 600 MG TABS tablet Take 600 mg by mouth daily with breakfast.    . carbidopa-levodopa (SINEMET IR) 25-100 MG per tablet 1.5 tablets in the morning and evening, one tablet at midday and at dinnertime 450 tablet 1  . carvedilol (COREG) 3.125 MG tablet Take 3.125 mg by mouth 2 (two) times daily with a meal.    . Cholecalciferol (VITAMIN D-3) 1000 UNITS CAPS Take 1,000 Units by mouth daily.    . cholestyramine (QUESTRAN) 4 G packet TAKE 1 PACKET BY MOUTH TWICE DAILY WITH A MEAL 60 each 11  . fluorouracil (EFUDEX) 5 % cream Apply 1 application topically daily as needed. For fungus    . furosemide (LASIX) 20 MG tablet Take 1 tablet (20 mg total) by mouth daily. 30 tablet 0  . ipratropium (ATROVENT) 0.03 % nasal spray Place 2 sprays into both nostrils every 12 (twelve) hours.    . isosorbide mononitrate (IMDUR) 30 MG 24 hr tablet TAKE ONE TABLET EACH DAY 30 tablet 3  . itraconazole (SPORANOX) 100 MG capsule Take 200 mg by mouth daily.     Marland Kitchen loperamide (IMODIUM) 2 MG capsule Take 2 mg by mouth daily as needed for diarrhea or loose stools.    . midodrine (PROAMATINE) 2.5 MG tablet Take 1 tablet (2.5 mg total) by mouth 2 (two) times daily with a meal. 60 tablet 0  . Multiple Vitamins-Minerals (CENTRUM SILVER PO) Take 1 tablet by mouth daily.    . mupirocin ointment (BACTROBAN) 2 % Apply 1 application topically daily as needed (skin irritation).     . naftifine (NAFTIN) 1 % cream Apply 1 application topically 2 (two) times daily.    . nitroGLYCERIN (NITROSTAT) 0.4 MG SL tablet Place 0.4 mg under the tongue every 5 (five) minutes as needed for chest pain.    Marland Kitchen ondansetron (ZOFRAN) 4 MG tablet Take 4 mg by mouth every 8 (eight)  hours as needed for nausea or vomiting.    . pantoprazole (PROTONIX) 40 MG tablet Take 1 tablet by mouth 2 (two) times daily.    . potassium chloride SA (K-DUR,KLOR-CON) 20 MEQ tablet Take 20 mEq by mouth daily.    . vitamin B-12 (CYANOCOBALAMIN) 1000 MCG tablet Take 1,000 mcg by mouth daily.     No current facility-administered medications for this visit.    Allergies:   Review of patient's allergies indicates no known allergies.   Social History:  The patient  reports that he has never smoked. He has never used smokeless tobacco. He reports that he drinks alcohol. He reports that he does not use  illicit drugs.   Family History:  The patient's family history includes Colon cancer in his brother; Coronary artery disease in an other family member; Emphysema in his father; Heart attack in his mother; Heart disease in his brother and brother; Heart failure in his mother; Hypertension in his mother.  ROS:  Please see the history of present illness.    All other systems are reviewed and otherwise negative.   PHYSICAL EXAM:  VS:  BP 100/70 mmHg  Pulse 104  Ht 5' 9.8" (1.773 m)  Wt 201 lb (91.173 kg)  BMI 29.00 kg/m2  SpO2 93% BMI: Body mass index is 29 kg/(m^2). O2 sat 93% Well nourished, well developed WM in no acute distress HEENT: normocephalic, atraumatic Neck: no JVD, carotid bruits or masses Cardiac:  normal S1, S2; reg rhythm with mildly elevated rate; no murmurs, rubs, or gallops Lungs:  Bilateral crackles at bases, no wheezing, rhonchi or rales Abd: rounded, distended, +BS, nontender MS: no deformity or atrophy Ext: 1+ bilateral LE edema Skin: warm and dry, no rash Neuro:  moves all extremities spontaneously, no focal abnormalities noted, follows commands Psych: euthymic mood, somewhat flat affect   EKG:  Done today shows sinus tach 104bpm, low voltage QRS, RBBB  Recent Labs: 09/22/2014: ALT 35 11/10/2014: B Natriuretic Peptide 160.2*; Hemoglobin 13.9; Platelets  208 11/12/2014: TSH 2.428 11/17/2014: BUN 8; Creatinine 0.9; Potassium 4.7; Sodium 132*  No results found for requested labs within last 365 days.   Estimated Creatinine Clearance: 70.4 mL/min (by C-G formula based on Cr of 0.9).   Wt Readings from Last 3 Encounters:  11/23/14 201 lb (91.173 kg)  11/21/14 183 lb (83.008 kg)  11/15/14 175 lb 12.8 oz (79.742 kg)     Other studies reviewed: Additional studies/records reviewed today include: summarized above  ASSESSMENT AND PLAN:  1. Acute on chronic diastolic CHF - discussed case with Dr. Meda Coffee given significant weight gain. May be difficult to manage in setting of continued salt intake and softer BP. At this time we will d/c Lasix and start Torsemide 9m daily in hopes of increased gut absorption. Importance of 2g sodium diet reinforced. Needs fluid restriction to less than 2L day (but he typically falls below this anyway). Will also hold Imdur to allow more room for BP. Continue BB given low grade sinus tach. Will have him follow-up closely in clinic next week with repeat BMET at that time. 2. Dizziness likely due to persistent orthostatic hypotension/history of essential hypertension - BP still running low, agree with likelihood above of this being multifactorial. Will hold Imdur. Increase midodrine to 547mBID. 3. CAD as above - denies recent CP. 4. Hyponatremia - f/u with repeat labs above.  Disposition: F/u in 1 week to reassess.  Current medicines are reviewed at length with the patient today.  The patient did not have any concerns regarding medicines.  SiRaechel AcheA-C 11/23/2014 10:04 AM     [facility] [address]  [phone])  ([phone])

## 2014-11-23 ENCOUNTER — Encounter: Payer: Self-pay | Admitting: Physician Assistant

## 2014-11-23 ENCOUNTER — Ambulatory Visit (INDEPENDENT_AMBULATORY_CARE_PROVIDER_SITE_OTHER): Payer: Medicare Other | Admitting: Physician Assistant

## 2014-11-23 ENCOUNTER — Telehealth: Payer: Self-pay | Admitting: Physician Assistant

## 2014-11-23 VITALS — BP 100/70 | HR 104 | Ht 69.8 in | Wt 201.0 lb

## 2014-11-23 DIAGNOSIS — I951 Orthostatic hypotension: Secondary | ICD-10-CM

## 2014-11-23 DIAGNOSIS — I2510 Atherosclerotic heart disease of native coronary artery without angina pectoris: Secondary | ICD-10-CM

## 2014-11-23 DIAGNOSIS — I5033 Acute on chronic diastolic (congestive) heart failure: Secondary | ICD-10-CM

## 2014-11-23 DIAGNOSIS — I10 Essential (primary) hypertension: Secondary | ICD-10-CM

## 2014-11-23 DIAGNOSIS — Z9861 Coronary angioplasty status: Secondary | ICD-10-CM

## 2014-11-23 DIAGNOSIS — R42 Dizziness and giddiness: Secondary | ICD-10-CM | POA: Diagnosis not present

## 2014-11-23 DIAGNOSIS — I5032 Chronic diastolic (congestive) heart failure: Secondary | ICD-10-CM

## 2014-11-23 DIAGNOSIS — E871 Hypo-osmolality and hyponatremia: Secondary | ICD-10-CM

## 2014-11-23 MED ORDER — MIDODRINE HCL 5 MG PO TABS: 5.0000 mg | ORAL_TABLET | Freq: Two times a day (BID) | ORAL | Status: DC

## 2014-11-23 MED ORDER — TORSEMIDE 20 MG PO TABS: 20.0000 mg | ORAL_TABLET | Freq: Every day | ORAL | Status: DC

## 2014-11-23 NOTE — Patient Instructions (Addendum)
Medication Instructions:  Discontinue Lasix Discontinue Imdur Start Torsemide ( 20 mg ) daily Increase Midodrine ( 5 mg ) twice a day  Labwork: Your physician recommends that you return for lab work in: One week for a bmet with follow up office visit   Testing/Procedures: -None  Follow-Up: Your physician recommends that you keep your scheduled  follow-up appointment on 8/26 with Ignacia Bayley, NP.   Any Other Special Instructions Will Be Listed Below (If Applicable).

## 2014-11-23 NOTE — Telephone Encounter (Signed)
At patient's request I called his daughter Webb Silversmith at [phone]. Discussed findings and plan of care. She verbalized understanding and gratitude. Magalene Mclear PA-C

## 2014-11-28 ENCOUNTER — Telehealth: Payer: Self-pay | Admitting: Cardiology

## 2014-11-28 ENCOUNTER — Ambulatory Visit (INDEPENDENT_AMBULATORY_CARE_PROVIDER_SITE_OTHER): Payer: Medicare Other | Admitting: Gastroenterology

## 2014-11-28 ENCOUNTER — Encounter: Payer: Self-pay | Admitting: Gastroenterology

## 2014-11-28 VITALS — BP 110/78 | HR 96 | Ht 67.25 in | Wt 175.4 lb

## 2014-11-28 DIAGNOSIS — K5090 Crohn's disease, unspecified, without complications: Secondary | ICD-10-CM | POA: Diagnosis not present

## 2014-11-28 NOTE — Progress Notes (Signed)
GI PROBLEM LIST:  1. Crohn's disease. Distant ileal and right colon resection by Dr. Gretta Cool in the very distant past. He was maintained on Pentasa, Entocort, and 68m of Purinethol for several years under the care of Dr. SLyla Son Hospitalization, May 2008, for acute myocardial infarction complicated by small bowel obstruction likely due to active Crohn's. Increased Purinethol to 100 mg daily and liver tests increased as well. Purinethol held and liver test normalized. The patient felt much better overall as well (less diarrhea, less fatigue). Winter, 2009: currently on 6 pills of Pentasa day feeling quite well overall. Summer, 2010 postoperative ileus following spine surgery (doubt active Crohn's flare). November, 2011: started cholestyramine with very good results (loose stools MUCH improved, only going 3-4 times a day). 03/2014 rov with increasing loose stools, urgency; started on uceris, levsin. Repeat colonoscopy 4 2016 showed anastomotic ulceration, narrowing of the ileocecal anastomosis. Biopsies showed chronic and active inflammation. He was started on Remicade 5 mg/kg after TB testing and hepatitis testing were proven to be negative with very good response to the remicade.  June 2016, holding Remicade due to fungal cellulitis 2. Dysphagia February 2012: Barium esophagram showed mild narrowing at his GE junction. Was planning to perform dilation off Plavix (if okay with his cardiologist) when he had a bowel obstruction.  3. partial small bowel obstruction, February 2012. CT suggested transition point in the pelvis. Not clear if there was active Crohn's disease contributing to this, however he was put on prednisone in hosp; started 3101ma day, taper off quickly. Recurrent obstruction 09/2012: CT scan IMPRESSION: Small bowel obstruction with apparent change in caliber within the anterior right lower quadrant - right upper pelvis most likely due to an adhesion. Was admitted to hosp, put on steroids  empirically however seems more likely to have been adhesive related than due to active IBD.    HPI: This is a   very pleasant 81ear old man whom I last saw about 2 months ago. He has had some significant issues with CHF since then. Currently breathing easier. He gets around in a wheelchair only.  We have held his Remicade since cellulitis from fungus was diagnosed. He is back on anti-fungal oral medications which cause nausea. He has loose stools daily. It does not sound like these aren't debilitating him too much currently.  Chief complaint is Crohn's disease   Past Medical History  Diagnosis Date  . Hypertension   . Hyperlipidemia   . Coronary artery disease     s/p anterior STEMI 04/2007 with BMS-> LAD, patent 12/2011, low risk nuc 04/2014  . GERD (gastroesophageal reflux disease)   . Other esophagitis   . Diaphragmatic hernia without mention of obstruction or gangrene   . Ulcerative (chronic) ileocolitis   . Diverticulosis of colon (without mention of hemorrhage)   . Other chronic nonalcoholic liver disease   . Regional enteritis of small intestine   . Flatulence, eructation, and gas pain   . Ischemic cardiomyopathy     EF initially 35-40% after MI 1/09; echo 7/08: EF 60%  . Fatty liver     on ultrasound of 10/2009  . Parkinson's disease   . Gait disorder   . Memory disorder   . Osteoporosis   . Renal calculi   . Melanoma   . RBBB 09/02/2013  . HOH (hard of hearing)     Hearing aids  . Cellulitis   . Chronic diastolic CHF (congestive heart failure)   . Crohn's disease   .  Orthostatic hypotension     Past Surgical History  Procedure Laterality Date  . Cholecystectomy    . Appendectomy    . Back surgery      L3, L4, L5  . Tonsillectomy    . Partial bowel resection    . Coronary artery stent placement    . Melanoma resection    . Cataract extraction      Bilateral  . Cardiac catheterization  09/25/06    EF 35-40% but more recently 60%    Current Outpatient  Prescriptions  Medication Sig Dispense Refill  . acetaminophen (TYLENOL) 325 MG tablet Take 650 mg by mouth every 6 (six) hours as needed for pain.    Marland Kitchen ADVAIR DISKUS 250-50 MCG/DOSE AEPB Inhale 1 puff into the lungs daily as needed. For shortness of breath    . aspirin EC 81 MG tablet Take 81 mg by mouth daily.    . calcium carbonate (CALCIUM 600) 600 MG TABS tablet Take 600 mg by mouth daily with breakfast.    . carbidopa-levodopa (SINEMET IR) 25-100 MG per tablet 1.5 tablets in the morning and evening, one tablet at midday and at dinnertime 450 tablet 1  . carvedilol (COREG) 3.125 MG tablet Take 3.125 mg by mouth 2 (two) times daily with a meal.    . Cholecalciferol (VITAMIN D-3) 1000 UNITS CAPS Take 1,000 Units by mouth daily.    . cholestyramine (QUESTRAN) 4 G packet TAKE 1 PACKET BY MOUTH TWICE DAILY WITH A MEAL 60 each 11  . fluorouracil (EFUDEX) 5 % cream Apply 1 application topically daily as needed. For fungus    . itraconazole (SPORANOX) 100 MG capsule Take 200 mg by mouth daily.     Marland Kitchen loperamide (IMODIUM) 2 MG capsule Take 2 mg by mouth daily as needed for diarrhea or loose stools.    . midodrine (PROAMATINE) 5 MG tablet Take 1 tablet (5 mg total) by mouth 2 (two) times daily with a meal. 60 tablet 9  . Multiple Vitamins-Minerals (CENTRUM SILVER PO) Take 1 tablet by mouth daily.    . mupirocin ointment (BACTROBAN) 2 % Apply 1 application topically daily as needed (skin irritation).     . naftifine (NAFTIN) 1 % cream Apply 1 application topically 2 (two) times daily.    . nitroGLYCERIN (NITROSTAT) 0.4 MG SL tablet Place 0.4 mg under the tongue every 5 (five) minutes as needed for chest pain.    Marland Kitchen ondansetron (ZOFRAN) 4 MG tablet Take 4 mg by mouth every 8 (eight) hours as needed for nausea or vomiting.    . pantoprazole (PROTONIX) 40 MG tablet Take 1 tablet by mouth 2 (two) times daily.    . potassium chloride SA (K-DUR,KLOR-CON) 20 MEQ tablet Take 20 mEq by mouth daily.    Marland Kitchen  torsemide (DEMADEX) 20 MG tablet Take 1 tablet (20 mg total) by mouth daily. 180 tablet 3  . vitamin B-12 (CYANOCOBALAMIN) 1000 MCG tablet Take 1,000 mcg by mouth daily.     No current facility-administered medications for this visit.    Allergies as of 11/28/2014  . (No Known Allergies)    Family History  Problem Relation Age of Onset  . Coronary artery disease    . Heart failure Mother   . Emphysema Father   . Heart disease Brother   . Heart disease Brother   . Colon cancer Brother   . Heart attack Mother   . Hypertension Mother     Social History   Social History  .  Marital Status: Married    Spouse Name: N/A  . Number of Children: 3  . Years of Education: college   Occupational History  . CEO-retired   . CEO    Social History Main Topics  . Smoking status: Never Smoker   . Smokeless tobacco: Never Used  . Alcohol Use: Yes     Comment: occasional  . Drug Use: No  . Sexual Activity: Not on file   Other Topics Concern  . Not on file   Social History Narrative   Patient is right handed.   Patient drinks about 1 cup of caffeine daily.     Physical Exam: BP 110/78 mmHg  Pulse 96  Ht 5' 7.25" (1.708 m)  Wt 175 lb 6 oz (79.55 kg)  BMI 27.27 kg/m2 Constitutional: generally well-appearing Psychiatric: alert and oriented x3 Abdomen: soft, nontender, nondistended, no obvious ascites, no peritoneal signs, normal bowel sounds   Assessment and plan: 81 y.o. male with Crohn's disease, chronic diarrhea, multiple comorbid conditions  I do not feel couple restarting his Remicade with ongoing cellulitis from fungus. If he is deemed cured from the fungus, off of 80 fungal medicines and I would reconsider restarting the Remicade but for now would like to treat his diarrhea more nonspecifically. I recommended he given to Imodium pills, a total of 4 mg, every day shortly after waking. This will be in addition to his daily cholestyramine. He will return to see me in 2  months and sooner if needed.   Owens Loffler, MD [facility] 11/28/2014, 11:34 AM

## 2014-11-28 NOTE — Telephone Encounter (Signed)
New message    Nurse states: Pt is scheduled for bmet at [facility] on Friday Pt is also needing bmet done at [facility] this week [facility] is willing to do draw on Thursday and send results with pt for Friday appt or does labs need to be done at the [address] office Please call to discuss

## 2014-11-28 NOTE — Patient Instructions (Signed)
Please start taking imodium two pills every morning. This should be on a daily, scheduled basis, NOT PRN. HOLDING remicade while you are still on antifungal medicines. Please return to see Dr. Ardis Hughs in 2 months.

## 2014-11-28 NOTE — Telephone Encounter (Signed)
Advised BMET draw OK to be done at [facility], to please send copy of labs w/ patient. Caller voiced understanding.

## 2014-11-29 ENCOUNTER — Telehealth: Payer: Self-pay | Admitting: Neurology

## 2014-11-29 ENCOUNTER — Encounter: Payer: Self-pay | Admitting: Gastroenterology

## 2014-11-29 NOTE — Telephone Encounter (Signed)
Thomes Dinning, RN Mgr/[facility] [phone] called to schedule appt for patient. Pt cancelled appt on 9/14 8:30 (New pt slot). Pt needs to be seen ASAP for Parkinson's.

## 2014-11-29 NOTE — Telephone Encounter (Signed)
I called Sam, RN. Appointment scheduled 9/6 at 8 AM.

## 2014-12-02 ENCOUNTER — Ambulatory Visit (INDEPENDENT_AMBULATORY_CARE_PROVIDER_SITE_OTHER): Payer: Medicare Other | Admitting: Nurse Practitioner

## 2014-12-02 ENCOUNTER — Other Ambulatory Visit: Payer: Medicare Other

## 2014-12-02 ENCOUNTER — Encounter: Payer: Self-pay | Admitting: Nurse Practitioner

## 2014-12-02 VITALS — BP 110/70 | HR 99 | Ht 67.25 in | Wt 172.0 lb

## 2014-12-02 DIAGNOSIS — I2510 Atherosclerotic heart disease of native coronary artery without angina pectoris: Secondary | ICD-10-CM

## 2014-12-02 DIAGNOSIS — I5032 Chronic diastolic (congestive) heart failure: Secondary | ICD-10-CM

## 2014-12-02 DIAGNOSIS — I951 Orthostatic hypotension: Secondary | ICD-10-CM | POA: Diagnosis not present

## 2014-12-02 DIAGNOSIS — E871 Hypo-osmolality and hyponatremia: Secondary | ICD-10-CM

## 2014-12-02 DIAGNOSIS — R42 Dizziness and giddiness: Secondary | ICD-10-CM

## 2014-12-02 DIAGNOSIS — I255 Ischemic cardiomyopathy: Secondary | ICD-10-CM | POA: Insufficient documentation

## 2014-12-02 DIAGNOSIS — I5033 Acute on chronic diastolic (congestive) heart failure: Secondary | ICD-10-CM

## 2014-12-02 DIAGNOSIS — Z9861 Coronary angioplasty status: Secondary | ICD-10-CM

## 2014-12-02 DIAGNOSIS — I10 Essential (primary) hypertension: Secondary | ICD-10-CM | POA: Diagnosis not present

## 2014-12-02 NOTE — Progress Notes (Signed)
Patient Name: [name] [name] Date of Encounter: 12/02/2014  Primary Care Provider:  [name], MD Primary Cardiologist:  P. [name], MD   Chief Complaint  81 year old male with a history of CAD, diastolic heart failure, hypertension, hyperlipidemia, Crohn's disease, and orthostatic hypotension who presents for follow-up related to volume overload.  Past Medical History   Past Medical History  Diagnosis Date  . Hypertension   . Hyperlipidemia   . Coronary artery disease     a. s/p anterior STEMI 04/2007 with BMS-> LAD;  b. Cath 12/2011 patent stent;  c. low risk nuc 04/2014.  Marland Kitchen GERD (gastroesophageal reflux disease)   . Other esophagitis   . Diaphragmatic hernia without mention of obstruction or gangrene   . Ulcerative (chronic) ileocolitis   . Diverticulosis of colon (without mention of hemorrhage)   . Other chronic nonalcoholic liver disease   . Regional enteritis of small intestine   . Flatulence, eructation, and gas pain   . Ischemic cardiomyopathy     a. EF initially 35-40% after MI 1/09; b. echo 7/08: EF 60%;  c. 11/2014 Echo: EF 65-70%, Gr 1 DD, mild MR.  . Fatty liver     a. on ultrasound of 10/2009  . Parkinson's disease   . Gait disorder   . Memory disorder   . Osteoporosis   . Renal calculi   . Melanoma   . RBBB 09/02/2013  . HOH (hard of hearing)     Hearing aids  . Cellulitis   . Chronic diastolic CHF (congestive heart failure)     a. EF initially 35-40% after MI 1/09; b. echo 7/08: EF 60%;  c. 11/2014 Echo: EF 65-70%, Gr 1 DD, mild MR.  . Crohn's disease   . Orthostatic hypotension    Past Surgical History  Procedure Laterality Date  . Cholecystectomy    . Appendectomy    . Back surgery      L3, L4, L5  . Tonsillectomy    . Partial bowel resection    . Coronary artery stent placement    . Melanoma resection    . Cataract extraction      Bilateral  . Cardiac catheterization  09/25/06    EF 35-40% but more recently 60%    Allergies  No Known  Allergies  HPI  81 year old male with the above complex past medical history.he was recently admitted to [facility] in the setting of lower extremity edema and orthostatic hypotension. He was last seen in cardiology clinic on August 17 with complaints of significant weight gain of approximately 26 pounds and lower extremity edema as well as abdominal distention. At that time, Lasix was discontinued in favor of torsemide. Over the past week, he has had significant diuresis with his weight down to 172. In that setting, he said significant improvement in edema and abdominal bloating. He also has had improvement in exercise tolerance with reduction in dyspnea. He had labs evaluated yesterday and his BUN and creatinine were normal at 15 and 1.16 respectively. Potassium was a little bit low at 3.6. Sodium was stable at 140. He denies chest pain, palpitations, PND, orthopnea. He does still experience some dizziness if he gets up too fast but thinks that wearing lower extremity support hose has helped.  Home Medications  Prior to Admission medications   Medication Sig Start Date End Date Taking? Authorizing Provider  acetaminophen (TYLENOL) 325 MG tablet Take 650 mg by mouth every 6 (six) hours as needed for pain.   Yes Historical  Provider, MD  ADVAIR DISKUS 250-50 MCG/DOSE AEPB Inhale 1 puff into the lungs daily as needed. For shortness of breath 01/24/14  Yes Historical Provider, MD  aspirin EC 81 MG tablet Take 81 mg by mouth daily.   Yes Historical Provider, MD  atorvastatin (LIPITOR) 40 MG tablet Take 40 mg by mouth daily.   Yes Historical Provider, MD  calcium carbonate (CALCIUM 600) 600 MG TABS tablet Take 600 mg by mouth daily with breakfast.   Yes Historical Provider, MD  carbidopa-levodopa (SINEMET IR) 25-100 MG per tablet 1.5 tablets in the morning and evening, one tablet at midday and at dinnertime 08/17/14  Yes [name], MD  carvedilol (COREG) 3.125 MG tablet Take 3.125 mg by mouth 2 (two)  times daily with a meal.   Yes Historical Provider, MD  Cholecalciferol (VITAMIN D-3) 1000 UNITS CAPS Take 1,000 Units by mouth daily.   Yes Historical Provider, MD  cholestyramine Lucrezia Starch) 4 G packet TAKE 1 PACKET BY MOUTH TWICE DAILY WITH A MEAL 11/15/13  Yes [name], MD  fluorouracil (EFUDEX) 5 % cream Apply 1 application topically daily as needed. For fungus   Yes Historical Provider, MD  itraconazole (SPORANOX) 100 MG capsule Take 200 mg by mouth daily.  09/30/14  Yes Historical Provider, MD  loperamide (IMODIUM) 2 MG capsule Take 2 mg by mouth daily as needed for diarrhea or loose stools.   Yes Historical Provider, MD  Multiple Vitamins-Minerals (CENTRUM SILVER PO) Take 1 tablet by mouth daily.   Yes Historical Provider, MD  mupirocin ointment (BACTROBAN) 2 % Apply 1 application topically daily as needed (skin irritation).  09/02/14  Yes Historical Provider, MD  naftifine (NAFTIN) 1 % cream Apply 1 application topically 2 (two) times daily.   Yes Historical Provider, MD  nitroGLYCERIN (NITROSTAT) 0.4 MG SL tablet Place 0.4 mg under the tongue every 5 (five) minutes as needed for chest pain.   Yes Historical Provider, MD  ondansetron (ZOFRAN) 4 MG tablet Take 4 mg by mouth every 8 (eight) hours as needed for nausea or vomiting.   Yes Historical Provider, MD  pantoprazole (PROTONIX) 40 MG tablet Take 1 tablet by mouth 2 (two) times daily. 09/14/14  Yes Historical Provider, MD  potassium chloride SA (K-DUR,KLOR-CON) 20 MEQ tablet Take 40 mEq by mouth daily.   Yes Historical Provider, MD  torsemide (DEMADEX) 20 MG tablet Take 1 tablet (20 mg total) by mouth daily. 11/23/14  Yes Dayna N Dunn, PA-C  vitamin B-12 (CYANOCOBALAMIN) 1000 MCG tablet Take 1,000 mcg by mouth daily.   Yes Historical Provider, MD  midodrine (PROAMATINE) 5 MG tablet Take 1 tablet (5 mg total) by mouth 2 (two) times daily with a meal. 11/23/14   Dayna N Dunn, PA-C    Review of Systems  As above, he has had significant  improvement and is symptomatically stable. He does continue to have some orthostasis though overall this is improved.  He denies chest pain, dyspnea, PND, orthopnea, dizziness, syncope, edema, or early satietyAll other systems reviewed and are otherwise negative except as noted above.  Physical Exam  VS:  BP 110/70 mmHg  Pulse 99  Ht 5' 7.25" (1.708 m)  Wt 172 lb (78.019 kg)  BMI 26.74 kg/m2 , BMI Body mass index is 26.74 kg/(m^2). GEN: Well nourished, well developed, in no acute distress. HEENT: normal. Neck: Supple, no JVD, carotid bruits, or masses. Cardiac: RRR, no murmurs, rubs, or gallops. No clubbing, cyanosis, trace bilateral lower extremity edema.  Radials/DP/PT  2+ and equal bilaterally.  Respiratory:  Respirations regular and unlabored, clear to auscultation bilaterally. GI: Soft, nontender, nondistended, BS + x 4. MS: no deformity or atrophy. Skin: warm and dry, no rash. Neuro:  Strength and sensation are intact. Psych: Normal affect.  Accessory Clinical Findings  ECG - regular sinus rhythm, 99, right bundle branch block, no acute ST or T changes.  Assessment & Plan  1.  Chronic diastolic congestive heart failure: Upon his last visit a week ago, he was markedly volume overloaded. Lasix was discontinued in favor of torsemide. He has had significant response to this with weight loss and symptomatically improving. Volume is stable today. He had labs drawn yesterday which were stable with a BUN of 15 and creatinine 1.16. Sodium was 140 with potassium 3.6. I will continue torsemide at the current dose and have asked him to increase his potassium to 40 mEq daily. We will arrange for follow-up in 2-3 weeks to check in follow-up basic metabolic panel.  2. Orthostatic hypotension: He continues to have mild dizziness when standing but has not had any syncope or falls. Wearing support hose seem to have helped. He also uses midodrine. There was some question as to whether or not adrenal  insufficiency may be playing a role and have advised that he follow-up with Dr. Virgina Jock, his PCP.  3. Coronary artery disease: He has not been having any chest pain. He remains on aspirin, beta blocker. He is not currently on a statin in the setting of Sporanox therapy for a fungal rash.  4. Disposition: Follow-up in a few weeks with repeat basic metabolic panel at that time.  Murray Hodgkins, NP 12/02/2014, 5:05 PM

## 2014-12-02 NOTE — Patient Instructions (Signed)
Medication Instructions:  1. INCREASE POTASSIUM TO 40 MEQ DAILY (THIS WILL BE 2 TABS DAILY)  Labwork: NONE  Testing/Procedures: NONE  Follow-Up: 1 MONTH WITH DR. [name] OR P/NP AT [facility]  Any Other Special Instructions Will Be Listed Below (If Applicable).

## 2014-12-05 ENCOUNTER — Non-Acute Institutional Stay (SKILLED_NURSING_FACILITY): Payer: Medicare Other | Admitting: Adult Health

## 2014-12-05 DIAGNOSIS — I951 Orthostatic hypotension: Secondary | ICD-10-CM

## 2014-12-05 DIAGNOSIS — I5032 Chronic diastolic (congestive) heart failure: Secondary | ICD-10-CM | POA: Diagnosis not present

## 2014-12-06 ENCOUNTER — Encounter: Payer: Self-pay | Admitting: Adult Health

## 2014-12-06 NOTE — Progress Notes (Signed)
[name], male   DOB: 19-May-1928, 81 y.o.   MRN: [id number]   Location:  [facility]  PCP: [name], MD  Code Status: DNR      Chief Complaint  Patient presents with  . Acute Visit    f/u dizziness, nausea, tachycardia    HPI: 81 y.o. male with h/o CAD s/p anterior STEMI 04/2007 with bare metal stent placed to LAD (found to be patent 9/13 w/o any obstructive disease overall, normal myoview 1/16, and chronic dyspnea on exertion with diastolic chf), HTN, hyperlipidemia, Crohn's disease, GERD, and Parkinson's disease was admitted here to [facility] s/p hospitalization from 8/4-11/14/14 due to orthostatic hypotension.      He had been here in rehab in May after a hospitalization and to complete treatment for his UE cellulitis felt at first to be bacterial and treatment with abx, but later seen by derm and put on sporanox 6/24 for fungal infection and then later stopped with an unclear date. He was seen by derm two weeks ago and due to recurrent plaques and redness he was restarted sporanox and naftin cream.  The area is improving but continues to be red by the patient's account.   He was treated for orthostatic hypotension in the hospital and started on midodrine with improvement in symptoms upon discharge.Since then he has continued to experience dizziness when standing with soft BP numbers at times. Orthostatics have not shown significant drop upon standing. His HR has remained elevated through out his stay ranging 99-112.  His Coreg was reduce to see if this would help his dizziness but it did not and his heart rate remained unchanged (continues to run high even prior to decrease)  He was seen by cards on 11/23/14 and started on Torsemide for weight gain and his weight has decreased by 9 lbs, currently at 174 lbs. During his stay we tried to hold lasix to see if this would help his dizziness but he became too edematous. He is currently on a 1500cc fluid restriction  which has been difficult for him due to low sodium and edematous state.   He continues to report chronic nausea.  At first this was attributed to antifungal therapy but he reports that this did not happen the last time he took it.   He has been on prednisone in the past, recently tapered in June, due to Crohn's dz.  His am cortisol level in the hospital was borderline at 6.5.      ROS: Review of Systems  Constitutional: Negative for fever, chills, malaise/fatigue and diaphoresis.  HENT: Negative for congestion.   Eyes: Negative for blurred vision.  Respiratory: Negative for shortness of breath.        DOE better  Cardiovascular: Negative for chest pain, palpitations and leg swelling.       Edema better  Gastrointestinal: Positive for nausea. Negative for vomiting, abdominal pain, diarrhea, constipation, blood in stool and melena.  Genitourinary: Negative for dysuria, urgency and frequency.       Intermittent incontinence  Musculoskeletal: Positive for falls.       3 falls in the past few months  Skin:       Arms are still discolored where fungal infection was bilaterally  Neurological: Positive for dizziness. Negative for loss of consciousness, weakness and headaches.       On standing and walking; neuropathy in feet; right side cramps on him especially right hand  Psychiatric/Behavioral: Positive for memory loss. Negative for  depression.    Past Medical History  Diagnosis Date  . Hypertension   . Hyperlipidemia   . Coronary artery disease     a. s/p anterior STEMI 04/2007 with BMS-> LAD;  b. Cath 12/2011 patent stent;  c. low risk nuc 04/2014.  Marland Kitchen GERD (gastroesophageal reflux disease)   . Other esophagitis   . Diaphragmatic hernia without mention of obstruction or gangrene   . Ulcerative (chronic) ileocolitis   . Diverticulosis of colon (without mention of hemorrhage)   . Other chronic nonalcoholic liver disease   . Regional enteritis of small intestine   . Flatulence,  eructation, and gas pain   . Ischemic cardiomyopathy     a. EF initially 35-40% after MI 1/09; b. echo 7/08: EF 60%;  c. 11/2014 Echo: EF 65-70%, Gr 1 DD, mild MR.  . Fatty liver     a. on ultrasound of 10/2009  . Parkinson's disease   . Gait disorder   . Memory disorder   . Osteoporosis   . Renal calculi   . Melanoma   . RBBB 09/02/2013  . HOH (hard of hearing)     Hearing aids  . Cellulitis   . Chronic diastolic CHF (congestive heart failure)     a. EF initially 35-40% after MI 1/09; b. echo 7/08: EF 60%;  c. 11/2014 Echo: EF 65-70%, Gr 1 DD, mild MR.  . Crohn's disease   . Orthostatic hypotension    Past Surgical History  Procedure Laterality Date  . Cholecystectomy    . Appendectomy    . Back surgery      L3, L4, L5  . Tonsillectomy    . Partial bowel resection    . Coronary artery stent placement    . Melanoma resection    . Cataract extraction      Bilateral  . Cardiac catheterization  09/25/06    EF 35-40% but more recently 60%   Social History:   reports that he has never smoked. He has never used smokeless tobacco. He reports that he drinks alcohol. He reports that he does not use illicit drugs.  Family History  Problem Relation Age of Onset  . Coronary artery disease    . Heart failure Mother   . Emphysema Father   . Heart disease Brother   . Heart disease Brother   . Colon cancer Brother   . Heart attack Mother   . Hypertension Mother     No Known Allergies  Medications: Patient's Medications  New Prescriptions   No medications on file  Previous Medications   ACETAMINOPHEN (TYLENOL) 325 MG TABLET    Take 650 mg by mouth every 6 (six) hours as needed for pain.   ADVAIR DISKUS 250-50 MCG/DOSE AEPB    Inhale 1 puff into the lungs daily as needed. For shortness of breath   ASPIRIN EC 81 MG TABLET    Take 81 mg by mouth daily.   ATORVASTATIN (LIPITOR) 40 MG TABLET    Take 40 mg by mouth daily.   CALCIUM CARBONATE (CALCIUM 600) 600 MG TABS TABLET    Take  600 mg by mouth daily with breakfast.   CARBIDOPA-LEVODOPA (SINEMET IR) 25-100 MG PER TABLET    1.5 tablets in the morning and evening, one tablet at midday and at dinnertime   CARVEDILOL (COREG) 3.125 MG TABLET    Take 3.125 mg by mouth 2 (two) times daily with a meal.   CHOLECALCIFEROL (VITAMIN D-3) 1000 UNITS CAPS  Take 1,000 Units by mouth daily.   CHOLESTYRAMINE (QUESTRAN) 4 G PACKET    TAKE 1 PACKET BY MOUTH TWICE DAILY WITH A MEAL   FLUOROURACIL (EFUDEX) 5 % CREAM    Apply 1 application topically daily as needed. For fungus   ITRACONAZOLE (SPORANOX) 100 MG CAPSULE    Take 200 mg by mouth daily.    LOPERAMIDE (IMODIUM) 2 MG CAPSULE    Take 2 mg by mouth daily as needed for diarrhea or loose stools.   MIDODRINE (PROAMATINE) 5 MG TABLET    Take 1 tablet (5 mg total) by mouth 2 (two) times daily with a meal.   MULTIPLE VITAMINS-MINERALS (CENTRUM SILVER PO)    Take 1 tablet by mouth daily.   MUPIROCIN OINTMENT (BACTROBAN) 2 %    Apply 1 application topically daily as needed (skin irritation).    NAFTIFINE (NAFTIN) 1 % CREAM    Apply 1 application topically 2 (two) times daily.   NITROGLYCERIN (NITROSTAT) 0.4 MG SL TABLET    Place 0.4 mg under the tongue every 5 (five) minutes as needed for chest pain.   ONDANSETRON (ZOFRAN) 4 MG TABLET    Take 4 mg by mouth every 8 (eight) hours as needed for nausea or vomiting.   PANTOPRAZOLE (PROTONIX) 40 MG TABLET    Take 1 tablet by mouth 2 (two) times daily.   POTASSIUM CHLORIDE SA (K-DUR,KLOR-CON) 20 MEQ TABLET    Take 40 mEq by mouth daily.   TORSEMIDE (DEMADEX) 20 MG TABLET    Take 1 tablet (20 mg total) by mouth daily.   VITAMIN B-12 (CYANOCOBALAMIN) 1000 MCG TABLET    Take 1,000 mcg by mouth daily.  Modified Medications   No medications on file  Discontinued Medications   No medications on file     Physical Exam: Filed Vitals:   12/06/14 1533  BP: 109/69  Pulse: 112  Temp: 98 F (36.7 C)  Resp: 20  Weight: 174 lb (78.926 kg)     Physical Exam  Constitutional: He is oriented to person, place, and time. He appears well-developed and well-nourished. No distress.  HENT:  Head: Normocephalic and atraumatic.  Neck: Normal range of motion. Neck supple. No JVD present. No thyromegaly present.  Cardiovascular: Intact distal pulses.   Irregular; trace edema  Pulmonary/Chest: Effort normal and breath sounds normal. No respiratory distress.  Abdominal: Soft. Bowel sounds are normal. There is no tenderness.  Rotund abd, firm, at baseline per family  Musculoskeletal: He exhibits no edema or tenderness.  Stable gait with walker  Lymphadenopathy:    He has no cervical adenopathy.  Neurological: He is alert and oriented to person, place, and time.  Skin: Skin is warm and dry. There is erythema.  Right lower arm with erythema.  Ruddy complexion. Skin tear dressed to left upper arm   Psychiatric: He has a normal mood and affect.  Poor short term memory     Labs reviewed: Basic Metabolic Panel:  Recent Labs  11/12/14 1108 11/13/14 0700 11/14/14 0413 11/17/14  NA 132* 138 136 132*  K 3.5 3.3* 3.3* 4.7  CL 97* 101 96*  --   CO2 26 28 29   --   GLUCOSE 120* 99 96  --   BUN 11 8 8 8   CREATININE 0.98 0.95 0.96 0.9  CALCIUM 7.7* 8.0* 8.0*  --    Liver Function Tests:  Recent Labs  09/05/14 1128 09/22/14 1533  AST 31 28  ALT 14* 35  ALKPHOS 43 44  BILITOT 2.7* 1.4*  PROT 6.3* 6.0  ALBUMIN 3.5 3.6   No results for input(s): LIPASE, AMYLASE in the last 8760 hours. No results for input(s): AMMONIA in the last 8760 hours. CBC:  Recent Labs  09/05/14 1128 09/22/14 1533 11/10/14 1933  WBC 8.2 9.0 5.9  NEUTROABS 6.4 7.3 3.1  HGB 16.0 14.6 13.9  HCT 48.2 43.9 41.2  MCV 98.6 97.5 101.2*  PLT 104* 184.0 208   Cardiac Enzymes:  Recent Labs  11/11/14 0238 11/11/14 0710 11/11/14 1358  TROPONINI <0.03 <0.03 <0.03   BNP: Invalid input(s): POCBNP  No results found for: HGBA1C Lab Results   Component Value Date   TSH 2.428 11/12/2014   Lab Results  Component Value Date   VITAMINB12 311 06/12/2006   Lab Results  Component Value Date   FOLATE > 20.0 ng/mL 06/12/2006   No results found for: IRON, TIBC, FERRITIN  Imaging and Procedures obtained prior to SNF admission: CXR and CT chest PE protocol performed 11/10/14 were reviewed   11/17/14: ammonia 62  Patient Care Team: Shon Baton, MD as PCP - General (Internal Medicine) Jolaine Artist, MD (Cardiology) [name] [name], MD as Consulting Physician (Cardiology) [name], MD as Attending Physician (Gastroenterology) [name], MD as Consulting Physician (Neurology)  Assessment/Plan 1. Orthostatic hypotension -according to the patient and his wife he has not improved with the current plan of midodrine -could be multifactorial given his PD, med regimen, and questionable adrenal insuff -I have asked the staff to set up an endocrinology referral to Dr. Virgina Jock to assess for adrenal insufficiency given his continued symptoms have nausea, dizziness, and elevated HR  2. Chronic diastolic CHF (congestive heart failure) -improved continue torsemide and may increase fluid restriction to 1800 cc    Cindi Carbon, ANP [facility] [phone]

## 2014-12-09 ENCOUNTER — Telehealth: Payer: Self-pay | Admitting: Gastroenterology

## 2014-12-09 NOTE — Telephone Encounter (Signed)
Pt having constipation. [facility] wants to know if Imodium order can be changed to PRN. Order given over the phone.

## 2014-12-13 ENCOUNTER — Ambulatory Visit (INDEPENDENT_AMBULATORY_CARE_PROVIDER_SITE_OTHER): Payer: Medicare Other | Admitting: Neurology

## 2014-12-13 ENCOUNTER — Non-Acute Institutional Stay (SKILLED_NURSING_FACILITY): Payer: Medicare Other | Admitting: Internal Medicine

## 2014-12-13 ENCOUNTER — Telehealth: Payer: Self-pay | Admitting: Neurology

## 2014-12-13 ENCOUNTER — Encounter: Payer: Self-pay | Admitting: Neurology

## 2014-12-13 VITALS — BP 118/84 | HR 92 | Ht 70.0 in | Wt 170.0 lb

## 2014-12-13 DIAGNOSIS — R531 Weakness: Secondary | ICD-10-CM

## 2014-12-13 DIAGNOSIS — I2510 Atherosclerotic heart disease of native coronary artery without angina pectoris: Secondary | ICD-10-CM

## 2014-12-13 DIAGNOSIS — I951 Orthostatic hypotension: Secondary | ICD-10-CM | POA: Diagnosis not present

## 2014-12-13 DIAGNOSIS — Z5181 Encounter for therapeutic drug level monitoring: Secondary | ICD-10-CM | POA: Diagnosis not present

## 2014-12-13 DIAGNOSIS — K50919 Crohn's disease, unspecified, with unspecified complications: Secondary | ICD-10-CM

## 2014-12-13 DIAGNOSIS — K7581 Nonalcoholic steatohepatitis (NASH): Secondary | ICD-10-CM | POA: Diagnosis not present

## 2014-12-13 DIAGNOSIS — B49 Unspecified mycosis: Secondary | ICD-10-CM

## 2014-12-13 DIAGNOSIS — R296 Repeated falls: Secondary | ICD-10-CM | POA: Diagnosis not present

## 2014-12-13 DIAGNOSIS — N400 Enlarged prostate without lower urinary tract symptoms: Secondary | ICD-10-CM

## 2014-12-13 DIAGNOSIS — R413 Other amnesia: Secondary | ICD-10-CM

## 2014-12-13 DIAGNOSIS — G20 Parkinson's disease: Secondary | ICD-10-CM

## 2014-12-13 DIAGNOSIS — R269 Unspecified abnormalities of gait and mobility: Secondary | ICD-10-CM

## 2014-12-13 DIAGNOSIS — I5032 Chronic diastolic (congestive) heart failure: Secondary | ICD-10-CM | POA: Diagnosis not present

## 2014-12-13 DIAGNOSIS — G20A1 Parkinson's disease without dyskinesia, without mention of fluctuations: Secondary | ICD-10-CM

## 2014-12-13 MED ORDER — CARBIDOPA-LEVODOPA 25-100 MG PO TABS: 1.5000 | ORAL_TABLET | Freq: Three times a day (TID) | ORAL | Status: DC

## 2014-12-13 NOTE — Telephone Encounter (Signed)
Manuela Schwartz with [facility] is calling and states that she needs clarification on the dosage for patient's Sinemet IR 25-100 mg.  Also Caryl Asp will be faxing over a form about the patient's order for using the pool. The number to fax her back is [phone].  Please call Manuela Schwartz.

## 2014-12-13 NOTE — Telephone Encounter (Signed)
I called and left a voicemail for Manuela Schwartz to call me back.

## 2014-12-13 NOTE — Progress Notes (Signed)
[name], male   DOB: [birth year], 81 y.o.   MRN: [id number]  Location:  [facility] Delvon Chipps L. Mariea Clonts, D.O., C.M.D.  PCP: [name], MD  Code Status: DNR Goals of care:  Advanced Directive information Does patient have an advance directive?: Yes, Type of Advance Directive: Ruth;Living will;Out of facility DNR (pink MOST or yellow form), Pre-existing out of facility DNR order (yellow form or pink MOST form): Yellow form placed in chart (order not valid for inpatient use), Does patient want to make changes to advanced directive?: No - Patient declined  No Known Allergies  Chief Complaint  Patient presents with  . Discharge Note    d/c home with his wife, pillboxes to be filled, cont OT and ST for cognition at home    HPI:  81 yo white male independent living resident of [facility] was seen today for discharge from rehab.  He's been wanting to go home for weeks, but had been having ongoing difficulty with lightheadedness when standing and walking so this was delayed due to orthostatic hypotension.  His most recent orthostatic vitals were negative.  Also, his wife feels overwhelmed helping to look after him at home.  She was joking that she would need admission soon.  He is back on treatment for his fungal infection of his arms after seeing derm on 8/30.  They are recommending an ID referral.  His flomax and statin got held due to the sporanox therapy and that drug interaction.  The sporanox was restarted along with naftin cream.  Workup of the orthostatic hypotension for adrenal insufficiency as a cause seems to have been negative with last am cortisol being 19.2 at 7:41am and ACTH 40.  His midodrine has been adjusted to 29m po bid with meals.  Coreg was reduced to 3.1254mpo bid.  His lasix was also held for 2 days on 8/15, 8/16 due to his orthostasis.  He then saw cardiology who discontinued his lasix and started him on torsemide 2026maily and  emphasized low sodium and 2 liter fluid restriction.  They held his imdur.  Repeat bmp showed stable cr at 1.16 and slightly low K at 3.6 so KCl was increased to 59m76maily and he is to return to cardiology in 2-3 wks.  He continues with support hose.  Fluid restriction was then decreased to 1800cc.    He also followed up with Dr. JacoArdis Hughs his Crohn's disease. He has decided not to start remicade due to his ongoing problems with deep fungal cellulitis of his arms.  He recommended 4mg 44mimodium daily and continuing his cholestyramine.  Imodium was then changed to prn.  This am, he saw Dr. WilliJannifer Franklinrecommended continued PT at home.  He's been getting PT here and the therapists felt he had been optimized at this point.  He's walking with a rolling walker.  He also adjusted his sinemet today, but nursing has to clarify the order b/c it does not look like it's really an increase as it reads to 1.5 tabs qid.  He made no mention of the orthostatic hypotension at all.  Nursing staff are filling his pillbox for his return home.  He is also to continue with OT and ST.    Review of Systems:  Review of Systems  Constitutional: Negative for fever and chills.  HENT: Positive for hearing loss. Negative for congestion.   Eyes: Negative for blurred vision.  Respiratory: Negative for shortness of  breath.   Cardiovascular: Positive for leg swelling. Negative for chest pain.       Edema improved  Gastrointestinal: Positive for diarrhea. Negative for abdominal pain, constipation, blood in stool and melena.  Genitourinary: Negative for dysuria.  Musculoskeletal: Positive for falls.  Skin: Positive for rash. Negative for itching.       Of both forearms  Neurological: Positive for dizziness and tremors. Negative for loss of consciousness.  Psychiatric/Behavioral: Positive for memory loss.    Past Medical History  Diagnosis Date  . Hypertension   . Hyperlipidemia   . Coronary artery disease     a. s/p  anterior STEMI 04/2007 with BMS-> LAD;  b. Cath 12/2011 patent stent;  c. low risk nuc 04/2014.  Marland Kitchen GERD (gastroesophageal reflux disease)   . Other esophagitis   . Diaphragmatic hernia without mention of obstruction or gangrene   . Ulcerative (chronic) ileocolitis   . Diverticulosis of colon (without mention of hemorrhage)   . Other chronic nonalcoholic liver disease   . Regional enteritis of small intestine   . Flatulence, eructation, and gas pain   . Ischemic cardiomyopathy     a. EF initially 35-40% after MI 1/09; b. echo 7/08: EF 60%;  c. 11/2014 Echo: EF 65-70%, Gr 1 DD, mild MR.  . Fatty liver     a. on ultrasound of 10/2009  . Parkinson's disease   . Gait disorder   . Memory disorder   . Osteoporosis   . Renal calculi   . Melanoma   . RBBB 09/02/2013  . HOH (hard of hearing)     Hearing aids  . Cellulitis   . Chronic diastolic CHF (congestive heart failure)     a. EF initially 35-40% after MI 1/09; b. echo 7/08: EF 60%;  c. 11/2014 Echo: EF 65-70%, Gr 1 DD, mild MR.  . Crohn's disease   . Orthostatic hypotension   . Fungal infection     Past Surgical History  Procedure Laterality Date  . Cholecystectomy    . Appendectomy    . Back surgery      L3, L4, L5  . Tonsillectomy    . Partial bowel resection    . Coronary artery stent placement    . Melanoma resection    . Cataract extraction      Bilateral  . Cardiac catheterization  09/25/06    EF 35-40% but more recently 60%    Social History:   reports that he has never smoked. He has never used smokeless tobacco. He reports that he drinks alcohol. He reports that he does not use illicit drugs.  No Known Allergies  Medications: Patient's Medications  New Prescriptions   No medications on file  Previous Medications   ACETAMINOPHEN (TYLENOL) 325 MG TABLET    Take 650 mg by mouth every 6 (six) hours as needed for pain.   ADVAIR DISKUS 250-50 MCG/DOSE AEPB    Inhale 1 puff into the lungs daily as needed. For shortness  of breath   ASPIRIN EC 81 MG TABLET    Take 81 mg by mouth daily.   CALCIUM CARBONATE (CALCIUM 600) 600 MG TABS TABLET    Take 600 mg by mouth daily with breakfast.   CARVEDILOL (COREG) 3.125 MG TABLET    Take 3.125 mg by mouth 2 (two) times daily with a meal.   CHOLECALCIFEROL (VITAMIN D-3) 1000 UNITS CAPS    Take 1,000 Units by mouth daily.   CHOLESTYRAMINE (QUESTRAN) 4 G PACKET  TAKE 1 PACKET BY MOUTH TWICE DAILY WITH A MEAL   FLUOROURACIL (EFUDEX) 5 % CREAM    Apply 1 application topically daily as needed. For fungus   ITRACONAZOLE (SPORANOX) 100 MG CAPSULE    Take 200 mg by mouth daily.    LOPERAMIDE (IMODIUM) 2 MG CAPSULE    Take 2 mg by mouth daily as needed for diarrhea or loose stools.   MIDODRINE (PROAMATINE) 5 MG TABLET    Take 1 tablet (5 mg total) by mouth 2 (two) times daily with a meal.   MULTIPLE VITAMINS-MINERALS (CENTRUM SILVER PO)    Take 1 tablet by mouth daily.   MUPIROCIN OINTMENT (BACTROBAN) 2 %    Apply 1 application topically daily as needed (skin irritation).    NAFTIFINE (NAFTIN) 1 % CREAM    Apply 1 application topically 2 (two) times daily.   NITROGLYCERIN (NITROSTAT) 0.4 MG SL TABLET    Place 0.4 mg under the tongue every 5 (five) minutes as needed for chest pain.   ONDANSETRON (ZOFRAN) 4 MG TABLET    Take 4 mg by mouth every 8 (eight) hours as needed for nausea or vomiting.   PANTOPRAZOLE (PROTONIX) 40 MG TABLET    Take 1 tablet by mouth 2 (two) times daily.   POTASSIUM CHLORIDE SA (K-DUR,KLOR-CON) 20 MEQ TABLET    Take 40 mEq by mouth daily.   TORSEMIDE (DEMADEX) 20 MG TABLET    Take 1 tablet (20 mg total) by mouth daily.   VITAMIN B-12 (CYANOCOBALAMIN) 1000 MCG TABLET    Take 1,000 mcg by mouth daily.  Modified Medications   Modified Medication Previous Medication   CARBIDOPA-LEVODOPA (SINEMET IR) 25-100 MG PER TABLET carbidopa-levodopa (SINEMET IR) 25-100 MG per tablet      Take 1.5 tablets by mouth 4 (four) times daily.    Take 1.5 tablets by mouth 3 (three)  times daily.  Discontinued Medications   No medications on file    Physical Exam: Filed Vitals:   12/13/14 1130  BP: 133/81  Pulse: 77  Temp: 97.1 F (36.2 C)  Resp: 20  Height: 5' 9.8" (1.773 m)  Weight: 169 lb 3.2 oz (76.749 kg)  SpO2: 93%   Body mass index is 24.41 kg/(m^2).  Physical Exam  Constitutional: He appears well-developed and well-nourished. No distress.  HENT:  Head: Normocephalic and atraumatic.  Eyes: Pupils are equal, round, and reactive to light.  Neck: Neck supple. No JVD present.  Cardiovascular: Normal rate, regular rhythm, normal heart sounds and intact distal pulses.   Pulmonary/Chest: Effort normal and breath sounds normal. No respiratory distress. He has no rales.  Abdominal: Soft. Bowel sounds are normal. He exhibits no distension. There is no tenderness.  abd obesity  Musculoskeletal:  Shuffling gait with rolling walker  Neurological: He is alert.  Oriented to person, place, not time  Skin:  Raised erythematous lumpy rash on bilateral forearms  Psychiatric: He has a normal mood and affect.    Labs reviewed: Basic Metabolic Panel:  Recent Labs  11/13/14 0700 11/14/14 0413 11/17/14 12/13/14 0846  NA 138 136 132* 142  K 3.3* 3.3* 4.7 4.8  CL 101 96*  --  99  CO2 28 29  --  27  GLUCOSE 99 96  --  113*  BUN 8 8 8 16   CREATININE 0.95 0.96 0.9 1.12  CALCIUM 8.0* 8.0*  --  8.8   Liver Function Tests:  Recent Labs  09/05/14 1128 09/22/14 1533 12/13/14 0846  AST 31 28  30  30  ALT 14* 35 15  17  ALKPHOS 43 44 61  62  BILITOT 2.7* 1.4* 0.9  0.7  PROT 6.3* 6.0 6.8  6.7  ALBUMIN 3.5 3.6  --    No results for input(s): LIPASE, AMYLASE in the last 8760 hours.  Recent Labs  12/13/14 0846  AMMONIA 42   CBC:  Recent Labs  09/05/14 1128 09/22/14 1533 11/10/14 1933 12/13/14 0846  WBC 8.2 9.0 5.9 6.7  NEUTROABS 6.4 7.3 3.1 3.7  HGB 16.0 14.6 13.9  --   HCT 48.2 43.9 41.2 40.9  MCV 98.6 97.5 101.2*  --   PLT 104* 184.0  208  --    Cardiac Enzymes:  Recent Labs  11/11/14 0238 11/11/14 0710 11/11/14 1358  TROPONINI <0.03 <0.03 <0.03   BNP: Invalid input(s): POCBNP CBG: No results for input(s): GLUCAP in the last 8760 hours.  Patient Care Team: Shon Baton, MD as PCP - General (Internal Medicine) Jolaine Artist, MD (Cardiology) [name] [name], MD as Consulting Physician (Cardiology) [name], MD as Attending Physician (Gastroenterology) [name], MD as Consulting Physician (Neurology)  Assessment/Plan: 1. Orthostatic hypotension -most likely due to his Parkinson's  -seems improved off flomax and with compression hose and midodrine 52m po bid with meals, reduced coreg -cont to monitor and cont home PT per Dr. WJannifer Franklin order  2. Chronic diastolic CHF (congestive heart failure) -seems this stabilized with torsemide 22mdaily, 1800cc fluid restriction and 2g sodium restriction -f/u bmp and with cardiology as planned -also cont kcl 4055mdaily  3. Atherosclerosis of native coronary artery of native heart without angina pectoris -stable, asymptomatic, imdur also on hold  4. Crohn's disease, unspecified complication -continues now on imodium as needed and regular cholestyramine -Dr. JacArdis Hughs consider remicade when pt's fungal rash clears   5. NASH (nonalcoholic steatohepatitis) -has had some difficulty with encephalopathy here also with elevated ammonia improved with a dose of lactulose  6. Parkinson's disease -now on 1.5mg46m qid of sinemet -cont pt, ot, st at home and get pillbox filled due to cognitive impairment and multiple physicians changing meds  7. BPH (benign prostatic hyperplasia) -off flomax due to sporanox and orthostatic hypotension -monitor for retention  8. Generalized weakness -cont therapy at home  9. Falls frequently -due to Parkinson's and orthostatic hypotension -cont therapy, fall precautions, always use rolling walker  10. Deep fungal  infection -cont sporanax and naftin cream per dermatology with holds on flomax and statin -Dr. RussVirgina Jock consider ID referral as per derm recs  Patient is being discharged with  services:  ST, OT  Patient is being discharged with the following durable medical equipment:  Continue use of walker at home, did very well with PT  Patient has been advised to f/u with Dr. RussVirgina Jock1-2 weeks to bring them up to date on his rehab stay.  He has already been seeing PCP while here in rehab for some reason.    Future labs/tests needed:  Derm appt recommended ID see him about his fungal infection on his arms  Dylyn Mclaren L. Yamaris Cummings, D.O. [facility] [address] [place] [id number] Phone (Mon-Fri 8am-5pm):  [phone]:  [phone] prompts after 5pm & weekends Office Phone:  [phone] Fax:  [phone]

## 2014-12-13 NOTE — Patient Instructions (Addendum)
   Blood work today, go up on the Sinemet taking one and a half tablets 3 times daily. We will get you set up for physical therapy. We'll follow-up in 3 months.  Parkinson Disease Parkinson disease is a disorder of the central nervous system, which includes the brain and spinal cord. A person with this disease slowly loses the ability to completely control body movements. Within the brain, there is a group of nerve cells (basal ganglia) that help control movement. The basal ganglia are damaged and do not work properly in a person with Parkinson disease. In addition, the basal ganglia produce and use a brain chemical called dopamine. The dopamine chemical sends messages to other parts of the body to control and coordinate body movements. Dopamine levels are low in a person with Parkinson disease. If the dopamine levels are low, then the body does not receive the correct messages it needs to move normally.  CAUSES  The exact reason why the basal ganglia get damaged is not known. Some medical researchers have thought that infection, genes, environment, and certain medicines may contribute to the cause.  SYMPTOMS   An early symptom of Parkinson disease is often an uncontrolled shaking (tremor) of the hands. The tremor will often disappear when the affected hand is consciously used.  As the disease progresses, walking, talking, getting out of a chair, and new movements become more difficult.  Muscles get stiff and movements become slower.  Balance and coordination become harder.  Depression, trouble swallowing, urinary problems, constipation, and sleep problems can occur.  Later in the disease, memory and thought processes may deteriorate. DIAGNOSIS  There are no specific tests to diagnose Parkinson disease. You may be referred to a neurologist for evaluation. Your caregiver will ask about your medical history, symptoms, and perform a physical exam. Blood tests and imaging tests of your brain may be  performed to rule out other diseases. The imaging tests may include an MRI or a CT scan. TREATMENT  The goal of treatment is to relieve symptoms. Medicines may be prescribed once the symptoms become troublesome. Medicine will not stop the progression of the disease, but medicine can make movement and balance better and help control tremors. Speech and occupational therapy may also be prescribed. Sometimes, surgical treatment of the brain can be done in young people. HOME CARE INSTRUCTIONS  Get regular exercise and rest periods during the day to help prevent exhaustion and depression.  If getting dressed becomes difficult, replace buttons and zippers with Velcro and elastic on your clothing.  Take all medicine as directed by your caregiver.  Install grab bars or railings in your home to prevent falls.  Go to speech or occupational therapy as directed.  Keep all follow-up visits as directed by your caregiver. SEEK MEDICAL CARE IF:  Your symptoms are not controlled with your medicine.  You fall.  You have trouble swallowing or choke on your food. MAKE SURE YOU:  Understand these instructions.  Will watch your condition.  Will get help right away if you are not doing well or get worse. Document Released: 03/22/2000 Document Revised: 07/20/2012 Document Reviewed: 04/24/2011 [facility] Patient Information 2015 [place], [place]. This information is not intended to replace advice given to you by your health care provider. Make sure you discuss any questions you have with your health care provider.

## 2014-12-13 NOTE — Progress Notes (Signed)
Reason for visit: Parkinson's disease  [name] [name] is an 81 y.o. male  History of present illness:  [name] [name] is an 81 year old right-handed white male with a history of Parkinson's disease. The patient has been at [facility], he has had at least 2 hospitalizations since last seen. The first was for Crohn's disease in April 2016, he was in the hospital again in August 2016 with congestive heart failure. The patient has had a decline in his physical and cognitive abilities associated with these hospitalizations. The Crohn's disease remains somewhat active, but he has improved over the last several weeks. He has had severe edema associated with the congestive heart failure. He is now using a walker for ambulation, he has been using this for the last 4 months. He has had some slight increasing confusion, and a decline in executive function and judgment. He has been getting some physical and occupational therapy. He has been dealing with a fungal infection on the skin of the arms. He is sleeping well at night. He has not fallen recently, he did fall once since last seen while in an exercise program. He returns to this office for an evaluation. More recently, he has been noted to have a low potassium level, but his white count, hemoglobin, and platelet levels have recently dropped out.  Past Medical History  Diagnosis Date  . Hypertension   . Hyperlipidemia   . Coronary artery disease     a. s/p anterior STEMI 04/2007 with BMS-> LAD;  b. Cath 12/2011 patent stent;  c. low risk nuc 04/2014.  Marland Kitchen GERD (gastroesophageal reflux disease)   . Other esophagitis   . Diaphragmatic hernia without mention of obstruction or gangrene   . Ulcerative (chronic) ileocolitis   . Diverticulosis of colon (without mention of hemorrhage)   . Other chronic nonalcoholic liver disease   . Regional enteritis of small intestine   . Flatulence, eructation, and gas pain   . Ischemic cardiomyopathy     a. EF initially  35-40% after MI 1/09; b. echo 7/08: EF 60%;  c. 11/2014 Echo: EF 65-70%, Gr 1 DD, mild MR.  . Fatty liver     a. on ultrasound of 10/2009  . Parkinson's disease   . Gait disorder   . Memory disorder   . Osteoporosis   . Renal calculi   . Melanoma   . RBBB 09/02/2013  . HOH (hard of hearing)     Hearing aids  . Cellulitis   . Chronic diastolic CHF (congestive heart failure)     a. EF initially 35-40% after MI 1/09; b. echo 7/08: EF 60%;  c. 11/2014 Echo: EF 65-70%, Gr 1 DD, mild MR.  . Crohn's disease   . Orthostatic hypotension   . Fungal infection     Past Surgical History  Procedure Laterality Date  . Cholecystectomy    . Appendectomy    . Back surgery      L3, L4, L5  . Tonsillectomy    . Partial bowel resection    . Coronary artery stent placement    . Melanoma resection    . Cataract extraction      Bilateral  . Cardiac catheterization  09/25/06    EF 35-40% but more recently 60%    Family History  Problem Relation Age of Onset  . Coronary artery disease    . Heart failure Mother   . Emphysema Father   . Heart disease Brother   . Heart disease Brother   .  Colon cancer Brother   . Heart attack Mother   . Hypertension Mother     Social history:  reports that he has never smoked. He has never used smokeless tobacco. He reports that he drinks alcohol. He reports that he does not use illicit drugs.   No Known Allergies  Medications:  Prior to Admission medications   Medication Sig Start Date End Date Taking? Authorizing Provider  acetaminophen (TYLENOL) 325 MG tablet Take 650 mg by mouth every 6 (six) hours as needed for pain.    Historical Provider, MD  ADVAIR DISKUS 250-50 MCG/DOSE AEPB Inhale 1 puff into the lungs daily as needed. For shortness of breath 01/24/14   Historical Provider, MD  aspirin EC 81 MG tablet Take 81 mg by mouth daily.    Historical Provider, MD  atorvastatin (LIPITOR) 40 MG tablet Take 40 mg by mouth daily.    Historical Provider, MD    calcium carbonate (CALCIUM 600) 600 MG TABS tablet Take 600 mg by mouth daily with breakfast.    Historical Provider, MD  carbidopa-levodopa (SINEMET IR) 25-100 MG per tablet 1.5 tablets in the morning and evening, one tablet at midday and at dinnertime 08/17/14   [name], MD  carvedilol (COREG) 3.125 MG tablet Take 3.125 mg by mouth 2 (two) times daily with a meal.    Historical Provider, MD  Cholecalciferol (VITAMIN D-3) 1000 UNITS CAPS Take 1,000 Units by mouth daily.    Historical Provider, MD  cholestyramine Lucrezia Starch) 4 G packet TAKE 1 PACKET BY MOUTH TWICE DAILY WITH A MEAL 11/15/13   [name], MD  fluorouracil (EFUDEX) 5 % cream Apply 1 application topically daily as needed. For fungus    Historical Provider, MD  itraconazole (SPORANOX) 100 MG capsule Take 200 mg by mouth daily.  09/30/14   Historical Provider, MD  loperamide (IMODIUM) 2 MG capsule Take 2 mg by mouth daily as needed for diarrhea or loose stools.    Historical Provider, MD  midodrine (PROAMATINE) 5 MG tablet Take 1 tablet (5 mg total) by mouth 2 (two) times daily with a meal. 11/23/14   Dayna N Dunn, PA-C  Multiple Vitamins-Minerals (CENTRUM SILVER PO) Take 1 tablet by mouth daily.    Historical Provider, MD  mupirocin ointment (BACTROBAN) 2 % Apply 1 application topically daily as needed (skin irritation).  09/02/14   Historical Provider, MD  naftifine (NAFTIN) 1 % cream Apply 1 application topically 2 (two) times daily.    Historical Provider, MD  nitroGLYCERIN (NITROSTAT) 0.4 MG SL tablet Place 0.4 mg under the tongue every 5 (five) minutes as needed for chest pain.    Historical Provider, MD  ondansetron (ZOFRAN) 4 MG tablet Take 4 mg by mouth every 8 (eight) hours as needed for nausea or vomiting.    Historical Provider, MD  pantoprazole (PROTONIX) 40 MG tablet Take 1 tablet by mouth 2 (two) times daily. 09/14/14   Historical Provider, MD  potassium chloride SA (K-DUR,KLOR-CON) 20 MEQ tablet Take 40 mEq by mouth  daily.    Historical Provider, MD  torsemide (DEMADEX) 20 MG tablet Take 1 tablet (20 mg total) by mouth daily. 11/23/14   Dayna N Dunn, PA-C  vitamin B-12 (CYANOCOBALAMIN) 1000 MCG tablet Take 1,000 mcg by mouth daily.    Historical Provider, MD    ROS:  Out of a complete 14 system review of symptoms, the patient complains only of the following symptoms, and all other reviewed systems are negative.  Decreased activity,  activity change, fatigue, decreased weight Facial swelling Eye itching Cough, wheezing, shortness of breath Leg swelling Swollen abdomen, diarrhea, nausea Incontinence of bladder, frequency of urination Muscle cramps, hand cramps, walking difficulty Skin wounds, rash Memory loss, dizziness, numbness Decreased concentration, depression, anxiety  Blood pressure 118/84, pulse 92, height 5' 10"  (1.778 m), weight 170 lb (77.111 kg).  Physical Exam  General: The patient is alert and cooperative at the time of the examination.  Skin: No significant peripheral edema is noted.   Neurologic Exam  Mental status: The patient is alert and oriented x 3 at the time of the examination. The patient has apparent normal recent and remote memory, with an apparently normal attention span and concentration ability. Mini-Mental Status Examination done today shows a total score of 30/30. The patient is able to name 9 animals in 30 seconds.   Cranial nerves: Facial symmetry is present. Speech is normal, no aphasia or dysarthria is noted. Extraocular movements are full. Visual fields are full. Masking of the face is seen.  Motor: The patient has good strength in all 4 extremities.  Sensory examination: Soft touch sensation is symmetric on the face, arms, and legs.  Coordination: The patient has good finger-nose-finger and heel-to-shin bilaterally.  Gait and station: The patient is unable to arise from a seated position with arms crossed. He is able to stand independently, normally he  walks with a walker, but he was able to and leg independently today, decreased arm swing noted on the left. Stooped posture is noted. The patient has a slow, deliberate gait, fair turns. Tandem gait is unsteady. Romberg is negative. No drift is seen.  Reflexes: Deep tendon reflexes are symmetric.   Assessment/Plan:  1. Parkinson's disease  2. Mild memory disturbance  3. Gait disturbance  4. Recent pancytopenia, hypokalemia  The patient will be set up for blood work today, we will also include a copper level, and a vitamin B12 level. The patient will reengage with physical therapy for gait training. Some of the cognitive and physical exacerbation is related to the intercurrent illness with the Crohn's disease and congestive heart failure. He will follow-up in 3 months. The driving issue was discussed today, I have asked him not to operate a motor vehicle until further notice. We will go up on the Sinemet, taking 25/100 mg tablets, 1.5 tablets four times a day.  Jill Alexanders MD 12/13/2014 8:30 PM  [facility] [address] [place], [place] [address]  Phone [phone] Fax [phone]

## 2014-12-13 NOTE — Telephone Encounter (Signed)
I called [name] [name]. Dr. Jannifer Franklin stated the order for Sinemet should be 1.5 tablets four times daily. She verbalized understanding. She requested we add on a liver function test to the labs drawn this morning so the patient will not have to be stuck again on Thursday. Per Malachy Mood (with [facility]), this is no problem. I will fax the patient's labs to [phone] once they are received. She also requested we send a copy to Dr. Virgina Jock (PCP) at [facility]. [name] [name] also requested a letter stating the patient can use the [facility]. I will fax that to [phone] once it is completed and signed.

## 2014-12-13 NOTE — Telephone Encounter (Signed)
Manuela Schwartz called back from [place] due to a missed call.would like a call back regarding the pt.

## 2014-12-14 ENCOUNTER — Ambulatory Visit: Payer: Medicare Other | Admitting: Cardiovascular Disease

## 2014-12-14 LAB — HEPATIC FUNCTION PANEL
ALT: 17 IU/L (ref 0–44)
AST: 30 IU/L (ref 0–40)
Albumin: 4.1 g/dL (ref 3.5–4.7)
Alkaline Phosphatase: 62 IU/L (ref 39–117)
Bilirubin Total: 0.7 mg/dL (ref 0.0–1.2)
Bilirubin, Direct: 0.2 mg/dL (ref 0.00–0.40)
Total Protein: 6.7 g/dL (ref 6.0–8.5)

## 2014-12-14 LAB — SPECIMEN STATUS REPORT

## 2014-12-14 NOTE — Telephone Encounter (Signed)
Letter faxed. Confirmation received

## 2014-12-15 LAB — CBC WITH DIFFERENTIAL/PLATELET
Basophils Absolute: 0 10*3/uL (ref 0.0–0.2)
Basos: 1 %
EOS (ABSOLUTE): 0.2 10*3/uL (ref 0.0–0.4)
Eos: 3 %
Hematocrit: 40.9 % (ref 37.5–51.0)
Hemoglobin: 13.4 g/dL (ref 12.6–17.7)
Immature Grans (Abs): 0 10*3/uL (ref 0.0–0.1)
Immature Granulocytes: 0 %
Lymphocytes Absolute: 2 10*3/uL (ref 0.7–3.1)
Lymphs: 30 %
MCH: 32.6 pg (ref 26.6–33.0)
MCHC: 32.8 g/dL (ref 31.5–35.7)
MCV: 100 fL — ABNORMAL HIGH (ref 79–97)
Monocytes Absolute: 0.8 10*3/uL (ref 0.1–0.9)
Monocytes: 12 %
Neutrophils Absolute: 3.7 10*3/uL (ref 1.4–7.0)
Neutrophils: 54 %
Platelets: 325 10*3/uL (ref 150–379)
RBC: 4.11 x10E6/uL — ABNORMAL LOW (ref 4.14–5.80)
RDW: 13.4 % (ref 12.3–15.4)
WBC: 6.7 10*3/uL (ref 3.4–10.8)

## 2014-12-15 LAB — COMPREHENSIVE METABOLIC PANEL
ALT: 15 IU/L (ref 0–44)
AST: 30 IU/L (ref 0–40)
Albumin/Globulin Ratio: 1.5 (ref 1.1–2.5)
Albumin: 4.1 g/dL (ref 3.5–4.7)
Alkaline Phosphatase: 61 IU/L (ref 39–117)
BUN/Creatinine Ratio: 14 (ref 10–22)
BUN: 16 mg/dL (ref 8–27)
Bilirubin Total: 0.9 mg/dL (ref 0.0–1.2)
CO2: 27 mmol/L (ref 18–29)
Calcium: 8.8 mg/dL (ref 8.6–10.2)
Chloride: 99 mmol/L (ref 97–108)
Creatinine, Ser: 1.12 mg/dL (ref 0.76–1.27)
GFR calc Af Amer: 70 mL/min/{1.73_m2} (ref >59)
GFR calc non Af Amer: 60 mL/min/{1.73_m2} (ref >59)
Globulin, Total: 2.7 g/dL (ref 1.5–4.5)
Glucose: 113 mg/dL — ABNORMAL HIGH (ref 65–99)
Potassium: 4.8 mmol/L (ref 3.5–5.2)
Sodium: 142 mmol/L (ref 134–144)
Total Protein: 6.8 g/dL (ref 6.0–8.5)

## 2014-12-15 LAB — COPPER, SERUM: Copper: 106 ug/dL (ref 72–166)

## 2014-12-15 LAB — METHYLMALONIC ACID, SERUM: Methylmalonic Acid: 177 nmol/L (ref 0–378)

## 2014-12-15 LAB — VITAMIN B12: Vitamin B-12: 469 pg/mL (ref 211–946)

## 2014-12-15 LAB — AMMONIA: Ammonia: 42 ug/dL (ref 27–102)

## 2014-12-16 ENCOUNTER — Encounter: Payer: Medicare Other | Admitting: Vascular Surgery

## 2014-12-16 ENCOUNTER — Encounter (HOSPITAL_COMMUNITY): Payer: Medicare Other

## 2014-12-18 ENCOUNTER — Encounter: Payer: Self-pay | Admitting: Internal Medicine

## 2014-12-18 MED ORDER — CARBIDOPA-LEVODOPA 25-100 MG PO TABS: 1.5000 | ORAL_TABLET | Freq: Four times a day (QID) | ORAL | Status: DC

## 2014-12-19 NOTE — Telephone Encounter (Signed)
Lab results faxed to [facility] and Dr. Virgina Jock.

## 2014-12-21 ENCOUNTER — Encounter: Payer: Self-pay | Admitting: Internal Medicine

## 2014-12-21 ENCOUNTER — Ambulatory Visit: Payer: Medicare Other | Admitting: Neurology

## 2014-12-29 ENCOUNTER — Encounter: Payer: Self-pay | Admitting: Internal Medicine

## 2014-12-29 ENCOUNTER — Ambulatory Visit (INDEPENDENT_AMBULATORY_CARE_PROVIDER_SITE_OTHER): Payer: Medicare Other | Admitting: Internal Medicine

## 2014-12-29 VITALS — BP 129/82 | HR 93 | Temp 97.6°F | Wt 168.0 lb

## 2014-12-29 DIAGNOSIS — B488 Other specified mycoses: Secondary | ICD-10-CM | POA: Insufficient documentation

## 2014-12-29 NOTE — Progress Notes (Signed)
[name], male   DOB: 1928-06-30, 81 y.o.   MRN: [id number] HPI: [name] [name] is a 81 y.o. male who is here after referral from dermatology for his skin fungal infection.  Allergies: No Known Allergies  Vitals: Temp: 97.6 F (36.4 C) (09/22 1024) Temp Source: Oral (09/22 1024) BP: 129/82 mmHg (09/22 1024) Pulse Rate: 93 (09/22 1024)  Past Medical History: Past Medical History  Diagnosis Date  . Hypertension   . Hyperlipidemia   . Coronary artery disease     a. s/p anterior STEMI 04/2007 with BMS-> LAD;  b. Cath 12/2011 patent stent;  c. low risk nuc 04/2014.  Marland Kitchen GERD (gastroesophageal reflux disease)   . Other esophagitis   . Diaphragmatic hernia without mention of obstruction or gangrene   . Ulcerative (chronic) ileocolitis   . Diverticulosis of colon (without mention of hemorrhage)   . Other chronic nonalcoholic liver disease   . Regional enteritis of small intestine   . Flatulence, eructation, and gas pain   . Ischemic cardiomyopathy     a. EF initially 35-40% after MI 1/09; b. echo 7/08: EF 60%;  c. 11/2014 Echo: EF 65-70%, Gr 1 DD, mild MR.  . Fatty liver     a. on ultrasound of 10/2009  . Parkinson's disease   . Gait disorder   . Memory disorder   . Osteoporosis   . Renal calculi   . Melanoma   . RBBB 09/02/2013  . HOH (hard of hearing)     Hearing aids  . Cellulitis   . Chronic diastolic CHF (congestive heart failure)     a. EF initially 35-40% after MI 1/09; b. echo 7/08: EF 60%;  c. 11/2014 Echo: EF 65-70%, Gr 1 DD, mild MR.  . Crohn's disease   . Orthostatic hypotension   . Fungal infection     Social History: Social History   Social History  . Marital Status: Married    Spouse Name: N/A  . Number of Children: 3  . Years of Education: college   Occupational History  . CEO-retired   . CEO    Social History Main Topics  . Smoking status: Never Smoker   . Smokeless tobacco: Never Used  . Alcohol Use: 0.0 oz/week    0 Standard drinks  or equivalent per week     Comment: very rarely  . Drug Use: No  . Sexual Activity: Not Asked   Other Topics Concern  . None   Social History Narrative   Patient is right handed.   Patient drinks very little caffeine.    Previous Regimen:   Current Regimen: Itraconazole  Labs: HEPATITIS B SURFACE AG (no units)  Date Value  07/22/2014 NEGATIVE    CrCl: Estimated Creatinine Clearance: 51.2 mL/min (by C-G formula based on Cr of 1.12).  Lipids: No results found for: CHOL, TRIG, HDL, CHOLHDL, VLDL, LDLCALC  Assessment: 81 yo with various medical problems including Crohn's. He has been on infliximab and prednisone previously for this. Has been off of it for now. After a fall, he develop a cellulitis like on his arms. Later on the culture show alternaria. He was started on itraconazole on 09/29/14 and has been on it since then. According to his wife and daughter, the infection looks much better. Immunosuppression drugs could probably played a part int this. He reside at [facility] so meds administration has been good. He is on protonix BID though, which could be an issue with his itraconazole due  to absorption. His wife is going to check with Dr Virgina Jock about possibly decreasing it to once a day. We are also going to check a level to see if the dose will need adjustments.   Recommendations:  Cont itraconazole 225m PO qday Check level today  Poss decrease protonix to once daily  PWilfred Lacy PharmD Clinical Infectious [facility] 12/29/2014, 11:28 AM

## 2014-12-29 NOTE — Progress Notes (Signed)
[name], male   DOB: 1928-10-20, 81 y.o.   MRN: [id number]         [facility]  Reason for Consult: Alternaria infection of right arm Referring Physician: Dr. Sydnee Levans  Patient Active Problem List   Diagnosis Date Noted  . Alternaria infection 12/29/2014    Priority: High  . Ischemic cardiomyopathy   . Coronary artery disease   . Chronic diastolic CHF (congestive heart failure)   . NASH (nonalcoholic steatohepatitis) 11/20/2014  . Deep fungal infection 11/20/2014  . Peripheral edema   . Dizziness 11/11/2014  . Acute diastolic CHF (congestive heart failure) 11/11/2014  . Pressure ulcer 11/11/2014  . Orthostatic hypotension   . Dyspnea on exertion 04/22/2014  . Dehydration 09/28/2012  . Acute kidney injury 09/28/2012  . Memory loss 08/19/2012  . Paralysis agitans 08/19/2012  . Abnormality of gait 08/19/2012  . Dysphagia 05/24/2011  . Tinea corporis 01/04/2011  . Angina of effort 12/14/2010  . Fatigue 12/14/2010  . NOSEBLEED 05/23/2010  . [facility] INTESTINE 03/16/2008  . Hyperlipidemia 04/29/2007  . Essential hypertension 04/29/2007  . Coronary atherosclerosis 04/29/2007  . GERD 04/29/2007  . Crohn's disease 04/29/2007  . SMALL BOWEL OBSTRUCTION 04/29/2007  . DIVERTICULOSIS, COLON 04/29/2007  . FATTY LIVER DISEASE 08/30/2006  . HEMORRHOIDS 03/26/2005  . ULCERATIVE ILEOCOLITIS 12/11/1999  . Erosive esophagitis 09/29/1997  . HIATAL HERNIA 09/29/1997    Patient's Medications  New Prescriptions   No medications on file  Previous Medications   ACETAMINOPHEN (TYLENOL) 325 MG TABLET    Take 650 mg by mouth every 6 (six) hours as needed for pain.   ADVAIR DISKUS 250-50 MCG/DOSE AEPB    Inhale 1 puff into the lungs daily as needed. For shortness of breath   ASPIRIN EC 81 MG TABLET    Take 81 mg by mouth daily.   CALCIUM CARBONATE (CALCIUM 600) 600 MG TABS TABLET    Take 600 mg by mouth daily with  breakfast.   CARBIDOPA-LEVODOPA (SINEMET IR) 25-100 MG PER TABLET    Take 1.5 tablets by mouth 4 (four) times daily.   CARVEDILOL (COREG) 3.125 MG TABLET    Take 3.125 mg by mouth 2 (two) times daily with a meal.   CHOLECALCIFEROL (VITAMIN D-3) 1000 UNITS CAPS    Take 1,000 Units by mouth daily.   CHOLESTYRAMINE (QUESTRAN) 4 G PACKET    TAKE 1 PACKET BY MOUTH TWICE DAILY WITH A MEAL   FLUOROURACIL (EFUDEX) 5 % CREAM    Apply 1 application topically daily as needed. For fungus   ITRACONAZOLE (SPORANOX) 100 MG CAPSULE    Take 200 mg by mouth daily.    LOPERAMIDE (IMODIUM) 2 MG CAPSULE    Take 2 mg by mouth daily as needed for diarrhea or loose stools.   MIDODRINE (PROAMATINE) 5 MG TABLET    Take 1 tablet (5 mg total) by mouth 2 (two) times daily with a meal.   MULTIPLE VITAMINS-MINERALS (CENTRUM SILVER PO)    Take 1 tablet by mouth daily.   MUPIROCIN OINTMENT (BACTROBAN) 2 %    Apply 1 application topically daily as needed (skin irritation).    NAFTIFINE (NAFTIN) 1 % CREAM    Apply 1 application topically 2 (two) times daily.   NITROGLYCERIN (NITROSTAT) 0.4 MG SL TABLET    Place 0.4 mg under the tongue every 5 (five) minutes as needed for chest pain.   ONDANSETRON (ZOFRAN) 4 MG TABLET    Take 4  mg by mouth every 8 (eight) hours as needed for nausea or vomiting.   PANTOPRAZOLE (PROTONIX) 40 MG TABLET    Take 1 tablet by mouth 2 (two) times daily.   POTASSIUM CHLORIDE SA (K-DUR,KLOR-CON) 20 MEQ TABLET    Take 40 mEq by mouth daily.   TORSEMIDE (DEMADEX) 20 MG TABLET    Take 1 tablet (20 mg total) by mouth daily.   VITAMIN B-12 (CYANOCOBALAMIN) 1000 MCG TABLET    Take 1,000 mcg by mouth daily.   VITAMIN B-12 (CYANOCOBALAMIN) 1000 MCG TABLET    Take 1,000 mcg by mouth daily.  Modified Medications   No medications on file  Discontinued Medications   No medications on file    Recommendations: 1. Continue his current dose of itraconazole 2. Check itraconazole serum levels today 3. We have  suggested seeing if he can decrease his Protonix to 40 mg daily 4. Follow-up in 4 weeks   Assessment: Mr. Porreca has soft tissue infection with Alternaria, a dematiaceous ("Black") fungus that is common in the environment. It is an opportunistic infection that has taken advantage of his age and recent treatment with Remicade and prednisone. I do not see any evidence of systemic spread of infection. I do not see any residual bulky lesions that would benefit from surgical resection. He seems to be improving slowly on itraconazole. These infections usually require months of effective therapy for cure. He is taking itraconazole correctly and consistently but absorption of itraconazole can be erratic and can be adversely affected by proton pump inhibitors since stomach acid promote absorption. We've asked him to try to cut his Protonix dose in half. We will also check serum itraconazole levels to see if his levels are in the therapeutic range.  HPI: [name] [name] is a 81 y.o. male with a history of Crohn's disease. He began to have a flare with more diarrhea early this year. He was started on Remicade and high-dose prednisone. His wife believes he was initially on 40 mg of prednisone daily. He received 2 doses of Remicade. Sometime in the spring he fell at home and scraped both arms on a chair. He initially had bruising and abrasions but began to develop pain and swelling with some nodular lesions sometime in May. I do not have complete records but it sounds like he was started on an antibiotic sometime in May for possible cellulitis. He was seen in the emergency department on 09/05/2014 and was also given a diagnosis of cellulitis. He did not respond well to initial therapy. He has been off of Remicade and prednisone since late May. He's had no recurrence of his Crohn's symptoms or diarrhea since stopping Remicade and prednisone.  In June a lesion near his right elbow was cultured and grew Alternaria. He was  started on itraconazole 200 mg daily. He is taking to 100 mg capsules right after dinner each evening and has not missed any doses. He has had slow but incomplete improvement. A biopsy was performed on 12/06/2014 and showed fungal elements compatible with Alternaria. He has had no problems tolerating his itraconazole. Mr. Hausen, his wife and his daughter all agree that he is improving slowly.  Review of Systems: Constitutional: positive for fatigue, negative for anorexia, chills, fevers, sweats and weight loss Ears, nose, mouth, throat, and face: negative Respiratory: negative Integument/breast: negative, as noted in history of present illness    Past Medical History  Diagnosis Date  . Hypertension   . Hyperlipidemia   . Coronary artery  disease     a. s/p anterior STEMI 04/2007 with BMS-> LAD;  b. Cath 12/2011 patent stent;  c. low risk nuc 04/2014.  Marland Kitchen GERD (gastroesophageal reflux disease)   . Other esophagitis   . Diaphragmatic hernia without mention of obstruction or gangrene   . Ulcerative (chronic) ileocolitis   . Diverticulosis of colon (without mention of hemorrhage)   . Other chronic nonalcoholic liver disease   . Regional enteritis of small intestine   . Flatulence, eructation, and gas pain   . Ischemic cardiomyopathy     a. EF initially 35-40% after MI 1/09; b. echo 7/08: EF 60%;  c. 11/2014 Echo: EF 65-70%, Gr 1 DD, mild MR.  . Fatty liver     a. on ultrasound of 10/2009  . Parkinson's disease   . Gait disorder   . Memory disorder   . Osteoporosis   . Renal calculi   . Melanoma   . RBBB 09/02/2013  . HOH (hard of hearing)     Hearing aids  . Cellulitis   . Chronic diastolic CHF (congestive heart failure)     a. EF initially 35-40% after MI 1/09; b. echo 7/08: EF 60%;  c. 11/2014 Echo: EF 65-70%, Gr 1 DD, mild MR.  . Crohn's disease   . Orthostatic hypotension   . Fungal infection     Social History  Substance Use Topics  . Smoking status: Never Smoker   . Smokeless  tobacco: Never Used  . Alcohol Use: 0.0 oz/week    0 Standard drinks or equivalent per week     Comment: very rarely    Family History  Problem Relation Age of Onset  . Coronary artery disease    . Heart failure Mother   . Emphysema Father   . Heart disease Brother   . Heart disease Brother   . Colon cancer Brother   . Heart attack Mother   . Hypertension Mother    No Known Allergies  OBJECTIVE: Filed Vitals:   12/29/14 1024  BP: 129/82  Pulse: 93  Temp: 97.6 F (36.4 C)  TempSrc: Oral  Weight: 168 lb (76.204 kg)   Body mass index is 24.24 kg/(m^2).   General: He is alert and well dressed and he is in no distress. His wife and daughter are with him today Skin: He has scattered ecchymoses and erythema, both of which appear quite chronic on both forearms. There are 2 nodular lesions on his right forearm. There is no unusual warmth or pain with palpation. His daughter has pictures of his right arm taken in late May. His arm is now much less swollen and less red. Lymph nodes: No palpable adenopathy Lungs: Clear Cor: Regular S1 and S2 with no murmur Abdomen: Soft and nontender.  Neuro: He seems to have some memory loss. He defers to his wife and daughter to answer most questions. However, his speech and conversation is normal.  Microbiology: No results found for this or any previous visit (from the past 240 hour(s)).  Michel Bickers, MD [facility] [place] [facility] [phone] pager   [phone] cell 12/29/2014, 11:13 AM

## 2014-12-30 ENCOUNTER — Ambulatory Visit (INDEPENDENT_AMBULATORY_CARE_PROVIDER_SITE_OTHER): Payer: Medicare Other | Admitting: Cardiology

## 2014-12-30 ENCOUNTER — Encounter: Payer: Self-pay | Admitting: Cardiology

## 2014-12-30 VITALS — BP 134/78 | HR 99 | Ht 70.0 in | Wt 168.8 lb

## 2014-12-30 DIAGNOSIS — K50918 Crohn's disease, unspecified, with other complication: Secondary | ICD-10-CM

## 2014-12-30 DIAGNOSIS — Z79899 Other long term (current) drug therapy: Secondary | ICD-10-CM

## 2014-12-30 DIAGNOSIS — B488 Other specified mycoses: Secondary | ICD-10-CM

## 2014-12-30 DIAGNOSIS — G20A1 Parkinson's disease without dyskinesia, without mention of fluctuations: Secondary | ICD-10-CM

## 2014-12-30 DIAGNOSIS — I10 Essential (primary) hypertension: Secondary | ICD-10-CM | POA: Diagnosis not present

## 2014-12-30 DIAGNOSIS — I5032 Chronic diastolic (congestive) heart failure: Secondary | ICD-10-CM

## 2014-12-30 DIAGNOSIS — G20 Parkinson's disease: Secondary | ICD-10-CM

## 2014-12-30 NOTE — Assessment & Plan Note (Signed)
Followed by Dr Megan Salon

## 2014-12-30 NOTE — Progress Notes (Signed)
12/30/2014 [name] [name]   July 01, 1928  [id number]  Primary Physician [name] Reel, MD Primary Cardiologist: Dr [name] [name]  HPI:  81 y/o male followed by [place] for his CAD (s/[place] anterior [place] rx'd with LAD PCI in '09- , HTN, diastolic CHF- most recent EF 60-70%, and orthostatic hypotension. Other problems include Chron's colitis, Parkinson's, and chronic fungal infection.           From a cardiac standpoint his main problem has been diastolic CHF. He saw [name] [name] 11/22/14 and was placed on Torsemide. He has responded well to this. He was doing well on f/u 8/26. He has since seen Dr [name] [name], Dr [name] [name], and Dr [name] [name]. He is here now for routine f/u. He and his wife live at [facility] They have arranged for a low sodium diet and the staff arranges his medications. His edema is under control and he was encouraged to wear his support stockings during the day. Low sodium compliance was reinforced.    Current Outpatient Prescriptions  Medication Sig Dispense Refill  . acetaminophen (TYLENOL) 325 MG tablet Take 650 mg by mouth every 6 (six) hours as needed for pain.    Marland Kitchen ADVAIR DISKUS 250-50 MCG/DOSE AEPB Inhale 1 puff into the lungs daily as needed. For shortness of breath    . aspirin EC 81 MG tablet Take 81 mg by mouth daily.    . calcium carbonate (CALCIUM 600) 600 MG TABS tablet Take 600 mg by mouth daily with breakfast.    . carbidopa-levodopa (SINEMET IR) 25-100 MG per tablet Take 1.5 tablets by mouth 4 (four) times daily. 450 tablet 1  . carvedilol (COREG) 3.125 MG tablet Take 3.125 mg by mouth 2 (two) times daily with a meal.    . Cholecalciferol (VITAMIN D-3) 1000 UNITS CAPS Take 1,000 Units by mouth daily.    . cholestyramine (QUESTRAN) 4 G packet TAKE 1 PACKET BY MOUTH TWICE DAILY WITH A MEAL 60 each 11  . fluorouracil (EFUDEX) 5 % cream Apply 1 application topically daily as needed. For fungus    . loperamide (IMODIUM) 2 MG capsule Take 2 mg by mouth daily as needed for  diarrhea or loose stools.    . midodrine (PROAMATINE) 5 MG tablet Take 1 tablet (5 mg total) by mouth 2 (two) times daily with a meal. 60 tablet 9  . Multiple Vitamins-Minerals (CENTRUM SILVER PO) Take 1 tablet by mouth daily.    . mupirocin ointment (BACTROBAN) 2 % Apply 1 application topically daily as needed (skin irritation).     . naftifine (NAFTIN) 1 % cream Apply 1 application topically 2 (two) times daily.    . nitroGLYCERIN (NITROSTAT) 0.4 MG SL tablet Place 0.4 mg under the tongue every 5 (five) minutes as needed for chest pain.    Marland Kitchen ondansetron (ZOFRAN) 4 MG tablet Take 4 mg by mouth every 8 (eight) hours as needed for nausea or vomiting.    . pantoprazole (PROTONIX) 40 MG tablet Take 40 mg by mouth daily.     . potassium chloride SA (K-DUR,KLOR-CON) 20 MEQ tablet Take 40 mEq by mouth daily.    Marland Kitchen torsemide (DEMADEX) 20 MG tablet Take 1 tablet (20 mg total) by mouth daily. 180 tablet 3  . vitamin B-12 (CYANOCOBALAMIN) 1000 MCG tablet Take 1,000 mcg by mouth daily.    . vitamin B-12 (CYANOCOBALAMIN) 1000 MCG tablet Take 1,000 mcg by mouth daily.     No current facility-administered medications for this visit.  No Known Allergies  Social History   Social History  . Marital Status: Married    Spouse Name: N/A  . Number of Children: 3  . Years of Education: college   Occupational History  . CEO-retired   . CEO    Social History Main Topics  . Smoking status: Never Smoker   . Smokeless tobacco: Never Used  . Alcohol Use: 0.0 oz/week    0 Standard drinks or equivalent per week     Comment: very rarely  . Drug Use: No  . Sexual Activity: Not on file   Other Topics Concern  . Not on file   Social History Narrative   Patient is right handed.   Patient drinks very little caffeine.     Review of Systems: General: negative for chills, fever, night sweats or weight changes.  Cardiovascular: negative for chest pain, dyspnea on exertion, edema, orthopnea, palpitations,  paroxysmal nocturnal dyspnea or shortness of breath Dermatological: negative for rash Respiratory: negative for cough or wheezing Urologic: negative for hematuria Abdominal: negative for nausea, vomiting, diarrhea, bright red blood per rectum, melena, or hematemesis Neurologic: negative for visual changes, syncope, or dizziness All other systems reviewed and are otherwise negative except as noted above.    Blood pressure 134/78, pulse 99, height 5' 10"  (1.778 m), weight 168 lb 12.8 oz (76.567 kg).  General appearance: alert, cooperative, no distress and frail, in good spirits Neck: no carotid bruit and no JVD Lungs: clear to auscultation bilaterally Heart: regular rate and rhythm Extremities: no edema Skin: cool and dry Neurologic: Grossly normal   ASSESSMENT AND PLAN:   Chronic diastolic CHF (congestive heart failure) EF 65-70% with grade 1 DD by echo Aug 2016 He is currently compensated  Coronary artery disease a. s/p anterior STEMI 04/2007 with BMS-> LAD;  b. Cath 12/2011 patent stent;  c. low risk nuc 04/2014.  Essential hypertension With orthostatic hypotension, currently stable  Crohn's disease Followed by Dr [name] [name]  Alternaria infection Followed by Dr [name] [name] disease Dr [name] [name] follows   PLAN  I did not change his Rx. Labs looked OK 12/13/14. He should see Dr [name] [name] in 3 months, check BMP before that visit.   [name] [name] K PA-C 12/30/2014 11:06 AM

## 2014-12-30 NOTE — Patient Instructions (Signed)
Your physician recommends that you return for lab work in: 3 months  Your physician recommends that you schedule a follow-up appointment in: 3 months with Dr. [name]

## 2014-12-30 NOTE — Addendum Note (Signed)
Addended by: Lorne Skeens D on: 12/30/2014 10:52 AM   Modules accepted: Medications

## 2014-12-30 NOTE — Progress Notes (Signed)
Received Fax from Dr. Jenny Reichmann Russo's office indicating that the pt's Protonix rx was changed from 40 MG twice daily to 40 MG once daily.  Recorded change in EPIC medication profile.

## 2014-12-30 NOTE — Assessment & Plan Note (Signed)
Dr Jannifer Franklin follows

## 2014-12-30 NOTE — Assessment & Plan Note (Signed)
Followed by Dr Ardis Hughs

## 2014-12-30 NOTE — Assessment & Plan Note (Signed)
a. s/p anterior STEMI 04/2007 with BMS-> LAD;  b. Cath 12/2011 patent stent;  c. low risk nuc 04/2014.

## 2014-12-30 NOTE — Assessment & Plan Note (Signed)
With orthostatic hypotension, currently stable

## 2014-12-30 NOTE — Assessment & Plan Note (Signed)
EF 65-70% with grade 1 DD by echo Aug 2016 He is currently compensated

## 2015-01-05 LAB — ITRACONAZOLE LEVEL, HPLC
Itraconazole Lvl: 0.08 ug/mL (ref <0.05)
Results Received: 0.11 ug/mL (ref <0.05)

## 2015-01-10 ENCOUNTER — Telehealth: Payer: Self-pay | Admitting: Cardiology

## 2015-01-10 NOTE — Telephone Encounter (Signed)
Returned call to Gustine with [employer].She stated she would like B/P parameters on when pt needs to hold B/P medication.Advised Dr.Jordan not in office this afternoon.Will send message to him for advice.

## 2015-01-10 NOTE — Telephone Encounter (Signed)
[name] [name] is calling because she wants to know the perimeter of holding his blood pressure medicine wants to make sure they are on the same page and needs an order for that . The fax number [phone]  Thanks

## 2015-01-11 NOTE — Telephone Encounter (Signed)
He is on low dose Coreg and torsemide. I would hold Coreg for BP less than 015 systolic.  Peter [name] MD, [facility]

## 2015-01-11 NOTE — Telephone Encounter (Signed)
Note faxed to [facility] at [facility] at fax # [phone].

## 2015-01-17 ENCOUNTER — Other Ambulatory Visit: Payer: Self-pay | Admitting: Internal Medicine

## 2015-01-17 ENCOUNTER — Telehealth: Payer: Self-pay | Admitting: Internal Medicine

## 2015-01-17 DIAGNOSIS — B488 Other specified mycoses: Secondary | ICD-10-CM

## 2015-01-17 MED ORDER — ITRACONAZOLE 100 MG PO CAPS: 400.0000 mg | ORAL_CAPSULE | Freq: Every day | ORAL | Status: DC

## 2015-01-17 NOTE — Telephone Encounter (Signed)
[name] [name] itraconazole level is subtherapeutic. I will increase his dose to 400 mg daily.

## 2015-01-17 NOTE — Telephone Encounter (Signed)
Patient called and he was given this information. [name] [name]

## 2015-01-24 ENCOUNTER — Other Ambulatory Visit: Payer: Self-pay

## 2015-01-24 MED ORDER — CHOLESTYRAMINE 4 G PO PACK: PACK | ORAL | Status: DC

## 2015-01-30 ENCOUNTER — Encounter: Payer: Self-pay | Admitting: Gastroenterology

## 2015-01-30 ENCOUNTER — Ambulatory Visit (INDEPENDENT_AMBULATORY_CARE_PROVIDER_SITE_OTHER): Payer: Medicare Other | Admitting: Gastroenterology

## 2015-01-30 VITALS — BP 100/60 | HR 96 | Ht 67.25 in | Wt 165.2 lb

## 2015-01-30 DIAGNOSIS — K50919 Crohn's disease, unspecified, with unspecified complications: Secondary | ICD-10-CM

## 2015-01-30 NOTE — Patient Instructions (Addendum)
Continue current meds without changes. Call if your diarrhea returns.

## 2015-01-30 NOTE — Progress Notes (Signed)
GI PROBLEM LIST:  1. Crohn's disease. Distant ileal and right colon resection by Dr. Gretta Cool in the very distant past. He was maintained on Pentasa, Entocort, and 82m of Purinethol for several years under the care of Dr. SLyla Son Hospitalization, May 2008, for acute myocardial infarction complicated by small bowel obstruction likely due to active Crohn's. Increased Purinethol to 100 mg daily and liver tests increased as well. Purinethol held and liver test normalized. The patient felt much better overall as well (less diarrhea, less fatigue). Winter, 2009: currently on 6 pills of Pentasa day feeling quite well overall. Summer, 2010 postoperative ileus following spine surgery (doubt active Crohn's flare). November, 2011: started cholestyramine with very good results (loose stools MUCH improved, only going 3-4 times a day). 03/2014 rov with increasing loose stools, urgency; started on uceris, levsin. Repeat colonoscopy 4 2016 showed anastomotic ulceration, narrowing of the ileocecal anastomosis. Biopsies showed chronic and active inflammation. He was started on Remicade 5 mg/kg after TB testing and hepatitis testing were proven to be negative with very good response to the remicade. June 2016, holding Remicade due to fungal cellulitis.  01/2015 continuing to hold remicade, fungus remains a problem 2. Dysphagia February 2012: Barium esophagram showed mild narrowing at his GE junction. Was planning to perform dilation off Plavix (if okay with his cardiologist) when he had a bowel obstruction.  3. partial small bowel obstruction, February 2012. CT suggested transition point in the pelvis. Not clear if there was active Crohn's disease contributing to this, however he was put on prednisone in hosp; started 321ma day, taper off quickly. Recurrent obstruction 09/2012: CT scan IMPRESSION: Small bowel obstruction with apparent change in caliber within the anterior right lower quadrant - right upper pelvis most  likely due to an adhesion. Was admitted to hosp, put on steroids empirically however seems more likely to have been adhesive related than due to active IBD.  HPI: This is a  Very pleasant [age]ear old man whom I last saw about 2 months ago he is here with his wife today.  Chief complaint is Crohn's disease  His bowels are fine.  Not on any imodium currently.  Takes questran twice daily.  Bowels are solid. No abd pains.  Two solid brown BMs per day.   Past Medical History  Diagnosis Date  . Hypertension   . Hyperlipidemia   . Coronary artery disease     a. s/p anterior STEMI 04/2007 with BMS-> LAD;  b. Cath 12/2011 patent stent;  c. low risk nuc 04/2014.  . Marland KitchenERD (gastroesophageal reflux disease)   . Other esophagitis   . Diaphragmatic hernia without mention of obstruction or gangrene   . Ulcerative (chronic) ileocolitis ([place]  . Diverticulosis of colon (without mention of hemorrhage)   . Other chronic nonalcoholic liver disease   . Regional enteritis of small intestine ([facility]  . Flatulence, eructation, and gas pain   . Ischemic cardiomyopathy     a. EF initially 35-40% after MI 1/09; b. echo 7/08: EF 60%;  c. 11/2014 Echo: EF 65-70%, Gr 1 DD, mild MR.  . Fatty liver     a. on ultrasound of 10/2009  . Parkinson's disease ([facility]  . Gait disorder   . Memory disorder   . Osteoporosis   . Renal calculi   . Melanoma ([facility]  . RBBB 09/02/2013  . HOH (hard of hearing)     Hearing aids  . Cellulitis   . Chronic diastolic CHF (congestive heart  failure) ([place])     a. EF initially 35-40% after MI 1/09; b. echo 7/08: EF 60%;  c. 11/2014 Echo: EF 65-70%, Gr 1 DD, mild MR.  . Crohn's disease ([place])   . Orthostatic hypotension   . Fungal infection     Past Surgical History  Procedure Laterality Date  . Cholecystectomy    . Appendectomy    . Back surgery      L3, L4, L5  . Tonsillectomy    . Partial bowel resection    . Coronary artery stent placement    . Melanoma resection    .  Cataract extraction      Bilateral  . Cardiac catheterization  09/25/06    EF 35-40% but more recently 60%    Current Outpatient Prescriptions  Medication Sig Dispense Refill  . acetaminophen (TYLENOL) 325 MG tablet Take 650 mg by mouth every 6 (six) hours as needed for pain.    Marland Kitchen ADVAIR DISKUS 250-50 MCG/DOSE AEPB Inhale 1 puff into the lungs daily as needed. For shortness of breath    . aspirin EC 81 MG tablet Take 81 mg by mouth daily.    . calcium carbonate (CALCIUM 600) 600 MG TABS tablet Take 600 mg by mouth daily with breakfast.    . carbidopa-levodopa (SINEMET IR) 25-100 MG per tablet Take 1.5 tablets by mouth 4 (four) times daily. 450 tablet 1  . carvedilol (COREG) 3.125 MG tablet Take 3.125 mg by mouth 2 (two) times daily with a meal.    . Cholecalciferol (VITAMIN D-3) 1000 UNITS CAPS Take 1,000 Units by mouth daily.    . cholestyramine (QUESTRAN) 4 G packet TAKE 1 PACKET BY MOUTH TWICE DAILY WITH A MEAL 60 each 11  . itraconazole (SPORANOX) 100 MG capsule Take 4 capsules (400 mg total) by mouth daily. 120 capsule 3  . midodrine (PROAMATINE) 5 MG tablet Take 1 tablet (5 mg total) by mouth 2 (two) times daily with a meal. 60 tablet 9  . Multiple Vitamins-Minerals (CENTRUM SILVER PO) Take 1 tablet by mouth daily.    . naftifine (NAFTIN) 1 % cream Apply 1 application topically 2 (two) times daily.    . pantoprazole (PROTONIX) 40 MG tablet Take 40 mg by mouth daily.     . potassium chloride SA (K-DUR,KLOR-CON) 20 MEQ tablet Take 40 mEq by mouth daily.    Marland Kitchen torsemide (DEMADEX) 20 MG tablet Take 1 tablet (20 mg total) by mouth daily. 180 tablet 3  . vitamin B-12 (CYANOCOBALAMIN) 1000 MCG tablet Take 1,000 mcg by mouth daily.    . nitroGLYCERIN (NITROSTAT) 0.4 MG SL tablet Place 0.4 mg under the tongue every 5 (five) minutes as needed for chest pain.     No current facility-administered medications for this visit.    Allergies as of 01/30/2015  . (No Known Allergies)    Family  History  Problem Relation Age of Onset  . Coronary artery disease    . Heart failure Mother   . Emphysema Father   . Heart disease Brother   . Heart disease Brother   . Colon cancer Brother   . Heart attack Mother   . Hypertension Mother     Social History   Social History  . Marital Status: Married    Spouse Name: N/A  . Number of Children: 3  . Years of Education: college   Occupational History  . CEO-retired   . CEO    Social History Main Topics  . Smoking status:  Never Smoker   . Smokeless tobacco: Never Used  . Alcohol Use: 0.0 oz/week    0 Standard drinks or equivalent per week     Comment: very rarely  . Drug Use: No  . Sexual Activity: Not on file   Other Topics Concern  . Not on file   Social History Narrative   Patient is right handed.   Patient drinks very little caffeine.     Physical Exam: BP 100/60 mmHg  Pulse 96  Ht 5' 7.25" (1.708 m)  Wt 165 lb 4 oz (74.957 kg)  BMI 25.69 kg/m2 Constitutional: generally well-appearing Psychiatric: alert and oriented x3 Abdomen: soft, nontender, nondistended, no obvious ascites, no peritoneal signs, normal bowel sounds   Assessment and plan: 81 y.o. male with Crohn's ileocolitis  He is currently not on any specific Crohn's therapy. His Remicade was held when he had a fungal cellulitis and fortunately he has not had any clinical worsening of his Crohn's. His itraconazole was recently increased since he still has evidence of this fungal cellulitis on his forearms. Think we will simply observe him clinically. He'll continue on Questran twice daily for now. He'll return to see me in 2 months and sooner if needed.   Owens Loffler, MD [facility] 01/30/2015, 3:51 PM

## 2015-01-31 ENCOUNTER — Encounter: Payer: Self-pay | Admitting: Internal Medicine

## 2015-01-31 ENCOUNTER — Ambulatory Visit (INDEPENDENT_AMBULATORY_CARE_PROVIDER_SITE_OTHER): Payer: Medicare Other | Admitting: Internal Medicine

## 2015-01-31 DIAGNOSIS — B488 Other specified mycoses: Secondary | ICD-10-CM

## 2015-01-31 NOTE — Assessment & Plan Note (Signed)
His Alternaria infection is improving. I will continue itraconazole for at least one more month. I will repeat his serum level today.

## 2015-01-31 NOTE — Progress Notes (Signed)
[name], male   DOB: July 19, 1928, 81 y.o.   MRN: [id number]         [facility]  Patient Active Problem List   Diagnosis Date Noted  . Alternaria infection ([place]) 12/29/2014    Priority: High  . Crohn's disease ([place]) 04/29/2007    Priority: Medium  . Parkinson disease ([place]) 12/30/2014  . Ischemic cardiomyopathy   . Coronary artery disease   . Chronic diastolic CHF (congestive heart failure) ([place])   . NASH (nonalcoholic steatohepatitis) 11/20/2014  . Peripheral edema   . Dizziness 11/11/2014  . Acute diastolic CHF (congestive heart failure) ([place]) 11/11/2014  . Pressure ulcer 11/11/2014  . Orthostatic hypotension   . Dyspnea on exertion 04/22/2014  . Dehydration 09/28/2012  . Acute kidney injury ([place]) 09/28/2012  . Memory loss 08/19/2012  . Paralysis agitans ([place]) 08/19/2012  . Abnormality of gait 08/19/2012  . Dysphagia 05/24/2011  . Tinea corporis 01/04/2011  . Angina of effort ([place]) 12/14/2010  . Fatigue 12/14/2010  . NOSEBLEED 05/23/2010  . Hyperlipidemia 04/29/2007  . Essential hypertension 04/29/2007  . GERD 04/29/2007  . SMALL BOWEL OBSTRUCTION 04/29/2007  . DIVERTICULOSIS, COLON 04/29/2007  . FATTY LIVER DISEASE 08/30/2006  . HEMORRHOIDS 03/26/2005  . Erosive esophagitis 09/29/1997  . HIATAL HERNIA 09/29/1997    Patient's Medications  New Prescriptions   No medications on file  Previous Medications   ACETAMINOPHEN (TYLENOL) 325 MG TABLET    Take 650 mg by mouth every 6 (six) hours as needed for pain.   ADVAIR DISKUS 250-50 MCG/DOSE AEPB    Inhale 1 puff into the lungs daily as needed. For shortness of breath   ASPIRIN EC 81 MG TABLET    Take 81 mg by mouth daily.   CALCIUM CARBONATE (CALCIUM 600) 600 MG TABS TABLET    Take 600 mg by mouth daily with breakfast.   CARBIDOPA-LEVODOPA (SINEMET IR) 25-100 MG PER TABLET    Take 1.5 tablets by mouth 4 (four) times daily.   CARVEDILOL (COREG) 3.125 MG TABLET    Take 3.125  mg by mouth 2 (two) times daily with a meal.   CHOLECALCIFEROL (VITAMIN D-3) 1000 UNITS CAPS    Take 1,000 Units by mouth daily.   CHOLESTYRAMINE (QUESTRAN) 4 G PACKET    TAKE 1 PACKET BY MOUTH TWICE DAILY WITH A MEAL   ITRACONAZOLE (SPORANOX) 100 MG CAPSULE    Take 4 capsules (400 mg total) by mouth daily.   MIDODRINE (PROAMATINE) 5 MG TABLET    Take 1 tablet (5 mg total) by mouth 2 (two) times daily with a meal.   MULTIPLE VITAMINS-MINERALS (CENTRUM SILVER PO)    Take 1 tablet by mouth daily.   NAFTIFINE (NAFTIN) 1 % CREAM    Apply 1 application topically 2 (two) times daily.   NITROGLYCERIN (NITROSTAT) 0.4 MG SL TABLET    Place 0.4 mg under the tongue every 5 (five) minutes as needed for chest pain.   PANTOPRAZOLE (PROTONIX) 40 MG TABLET    Take 40 mg by mouth daily.    POTASSIUM CHLORIDE SA (K-DUR,KLOR-CON) 20 MEQ TABLET    Take 40 mEq by mouth daily.   TORSEMIDE (DEMADEX) 20 MG TABLET    Take 1 tablet (20 mg total) by mouth daily.   VITAMIN B-12 (CYANOCOBALAMIN) 1000 MCG TABLET    Take 1,000 mcg by mouth daily.  Modified Medications   No medications on file  Discontinued Medications   No medications on file  Subjective: [name] [name] is in with his wife for his routine follow-up visit. He is now in his fourth month of itraconazole therapy for soft tissue infection of his forearms caused by Alternaria. He is feeling better and is noted marked improvement in the swelling and redness of his forearms recently. At the time of his first visit here one month ago his itraconazole level was noted to be subtherapeutic. I had him increase his itraconazole dose to 400 mg daily 2 weeks ago. He has had no recurrent diarrhea or other signs of a flare of his Crohn's disease. He has been off of Remicade since his infection started.  Review of Systems: Pertinent items are noted in HPI.  Past Medical History  Diagnosis Date  . Hypertension   . Hyperlipidemia   . Coronary artery disease     a. s/p  anterior STEMI 04/2007 with BMS-> LAD;  b. Cath 12/2011 patent stent;  c. low risk nuc 04/2014.  Marland Kitchen GERD (gastroesophageal reflux disease)   . Other esophagitis   . Diaphragmatic hernia without mention of obstruction or gangrene   . Ulcerative (chronic) ileocolitis ([place])   . Diverticulosis of colon (without mention of hemorrhage)   . Other chronic nonalcoholic liver disease   . Regional enteritis of small intestine ([place])   . Flatulence, eructation, and gas pain   . Ischemic cardiomyopathy     a. EF initially 35-40% after MI 1/09; b. echo 7/08: EF 60%;  c. 11/2014 Echo: EF 65-70%, Gr 1 DD, mild MR.  . Fatty liver     a. on ultrasound of 10/2009  . Parkinson's disease ([place])   . Gait disorder   . Memory disorder   . Osteoporosis   . Renal calculi   . Melanoma ([place])   . RBBB 09/02/2013  . HOH (hard of hearing)     Hearing aids  . Cellulitis   . Chronic diastolic CHF (congestive heart failure) ([facility])     a. EF initially 35-40% after MI 1/09; b. echo 7/08: EF 60%;  c. 11/2014 Echo: EF 65-70%, Gr 1 DD, mild MR.  . Crohn's disease ([place])   . Orthostatic hypotension   . Fungal infection     Social History  Substance Use Topics  . Smoking status: Never Smoker   . Smokeless tobacco: Never Used  . Alcohol Use: 0.0 oz/week    0 Standard drinks or equivalent per week     Comment: very rarely    Family History  Problem Relation Age of Onset  . Coronary artery disease    . Heart failure Mother   . Emphysema Father   . Heart disease Brother   . Heart disease Brother   . Colon cancer Brother   . Heart attack Mother   . Hypertension Mother     No Known Allergies  Objective: Filed Vitals:   01/31/15 1445  BP: 118/81  Pulse: 93  Temp: 97.6 F (36.4 C)  TempSrc: Oral  Height: 5' 6.5" (1.689 m)  Weight: 163 lb 1.9 oz (73.991 kg)   Body mass index is 25.94 kg/(m^2).  General: He is smiling and in good spirits. He is well dressed Skin: The swelling and redness of his forearms has  definitely improved  Lab Results Serum itraconazole level 12/29/14: 0.08 low   Problem List Items Addressed This Visit      High   Alternaria infection ([place])    His Alternaria infection is improving. I will continue itraconazole for at least one more  month. I will repeat his serum level today.      Relevant Orders   ITRACONAZOLE LEVEL, HPLC       Michel Bickers, [place] for [facility] [phone] pager   [phone] cell 01/31/2015, 3:09 PM

## 2015-02-04 LAB — ITRACONAZOLE LEVEL, HPLC
Itraconazole Lvl: 0.21 ug/mL (ref <0.05)
Results Received: 0.26 ug/mL (ref <0.05)

## 2015-02-08 ENCOUNTER — Telehealth: Payer: Self-pay | Admitting: Internal Medicine

## 2015-02-08 DIAGNOSIS — B488 Other specified mycoses: Secondary | ICD-10-CM

## 2015-02-08 MED ORDER — ITRACONAZOLE 100 MG PO CAPS: 300.0000 mg | ORAL_CAPSULE | Freq: Two times a day (BID) | ORAL | Status: DC

## 2015-02-08 NOTE — Telephone Encounter (Signed)
His serum itraconazole level obtained on 01/31/2015 remains subtherapeutic at 0.21 g per mL. I faxed orders today to [facility] to increase his itraconazole dose to 300 mg by mouth twice daily with meals. I also called [name] [name] and informed him of this change. He has a follow-up visit with me on 03/01/2015.

## 2015-02-20 ENCOUNTER — Telehealth: Payer: Self-pay | Admitting: *Deleted

## 2015-02-20 NOTE — Telephone Encounter (Signed)
Patient's wife called, stated they received a letter from their insurance company regarding the itraconazole.  Patient will receive a short term supply, but needs additional information for further refills.  Patient's wife did not have the letter on hand to read the exact wording.  RN contacted both [place] and Estill Bamberg, RN at [facility] for more information without success.  Per Estill Bamberg, the patient did receive a 30 day supply.  Per pharmacist, there was no issue filling the #180/month.  RN contacted wife back and asked that she bring the letter to the appointment on 11/23.  She will.  Additionally per Estill Bamberg, patient's weekly pill box will be delivered to him on Wedensday 11/23 instead of Thursday 11/24 due to the holiday.  She is requesting to be notified of any changes in medication ASAP so they can deliver the correct medications after his appointment that day. Landis Gandy, RN

## 2015-02-21 ENCOUNTER — Telehealth: Payer: Self-pay | Admitting: Neurology

## 2015-02-21 NOTE — Telephone Encounter (Signed)
[name] [name] with [facility] called requesting clarification for carbidopa-levodopa (SINEMET IR) 25-100 MG per tablet . Pt was in rehab at [facility], he he has homecare RN there that take care of his drugs. [facility] discharge orders show it to be taken 1-1/2 tabs 4 x day but RX sent to [facility] from 12/13/14 reads1-1/2 tabs 3 xday. He has been taking 1-1/2 tabs 4x day. Please call at [phone]

## 2015-02-21 NOTE — Telephone Encounter (Signed)
I called [name] [name]. According to Dr. Jannifer Franklin' last office note (12/13/14), the patient is to take Sinemet 25/100 1.5 tablets four times daily. She is going to refill this prescription. She thanked me for calling.

## 2015-03-01 ENCOUNTER — Ambulatory Visit (INDEPENDENT_AMBULATORY_CARE_PROVIDER_SITE_OTHER): Payer: Medicare Other | Admitting: Internal Medicine

## 2015-03-01 ENCOUNTER — Other Ambulatory Visit: Payer: Self-pay | Admitting: Internal Medicine

## 2015-03-01 DIAGNOSIS — B488 Other specified mycoses: Secondary | ICD-10-CM

## 2015-03-01 NOTE — Assessment & Plan Note (Signed)
Even though his itraconazole levels were subtherapeutic for much of his treatment course he has responded clinically and has no current evidence of active infection. I will stop itraconazole now. However I will check an itraconazole level on his current dose in case he needs to be retreated in the future. He may follow-up here as needed

## 2015-03-01 NOTE — Progress Notes (Signed)
[facility] for Infectious Disease  Patient Active Problem List   Diagnosis Date Noted  . Alternaria infection ([place]) 12/29/2014    Priority: High  . Crohn's disease ([place]) 04/29/2007    Priority: Medium  . Parkinson disease ([place]) 12/30/2014  . Ischemic cardiomyopathy   . Coronary artery disease   . Chronic diastolic CHF (congestive heart failure) ([place])   . NASH (nonalcoholic steatohepatitis) 11/20/2014  . Peripheral edema   . Dizziness 11/11/2014  . Acute diastolic CHF (congestive heart failure) ([place]) 11/11/2014  . Pressure ulcer 11/11/2014  . Orthostatic hypotension   . Dyspnea on exertion 04/22/2014  . Dehydration 09/28/2012  . Acute kidney injury ([place]) 09/28/2012  . Memory loss 08/19/2012  . Paralysis agitans ([place]) 08/19/2012  . Abnormality of gait 08/19/2012  . Dysphagia 05/24/2011  . Tinea corporis 01/04/2011  . Angina of effort ([place]) 12/14/2010  . Fatigue 12/14/2010  . NOSEBLEED 05/23/2010  . Hyperlipidemia 04/29/2007  . Essential hypertension 04/29/2007  . GERD 04/29/2007  . SMALL BOWEL OBSTRUCTION 04/29/2007  . DIVERTICULOSIS, COLON 04/29/2007  . FATTY LIVER DISEASE 08/30/2006  . HEMORRHOIDS 03/26/2005  . Erosive esophagitis 09/29/1997  . HIATAL HERNIA 09/29/1997    Patient's Medications  New Prescriptions   No medications on file  Previous Medications   ACETAMINOPHEN (TYLENOL) 325 MG TABLET    Take 650 mg by mouth every 6 (six) hours as needed for pain.   ADVAIR DISKUS 250-50 MCG/DOSE AEPB    Inhale 1 puff into the lungs daily as needed. For shortness of breath   ASPIRIN EC 81 MG TABLET    Take 81 mg by mouth daily.   CALCIUM CARBONATE (CALCIUM 600) 600 MG TABS TABLET    Take 600 mg by mouth daily with breakfast.   CARBIDOPA-LEVODOPA (SINEMET IR) 25-100 MG PER TABLET    Take 1.5 tablets by mouth 4 (four) times daily.   CARVEDILOL (COREG) 3.125 MG TABLET    Take 3.125 mg by mouth 2 (two) times daily with a meal.   CHOLECALCIFEROL (VITAMIN  D-3) 1000 UNITS CAPS    Take 1,000 Units by mouth daily.   CHOLESTYRAMINE (QUESTRAN) 4 G PACKET    TAKE 1 PACKET BY MOUTH TWICE DAILY WITH A MEAL   MIDODRINE (PROAMATINE) 5 MG TABLET    Take 1 tablet (5 mg total) by mouth 2 (two) times daily with a meal.   MULTIPLE VITAMINS-MINERALS (CENTRUM SILVER PO)    Take 1 tablet by mouth daily.   NAFTIFINE (NAFTIN) 1 % CREAM    Apply 1 application topically 2 (two) times daily.   NITROGLYCERIN (NITROSTAT) 0.4 MG SL TABLET    Place 0.4 mg under the tongue every 5 (five) minutes as needed for chest pain.   PANTOPRAZOLE (PROTONIX) 40 MG TABLET    Take 40 mg by mouth daily.    POTASSIUM CHLORIDE SA (K-DUR,KLOR-CON) 20 MEQ TABLET    Take 40 mEq by mouth daily.   TORSEMIDE (DEMADEX) 20 MG TABLET    Take 1 tablet (20 mg total) by mouth daily.   VITAMIN B-12 (CYANOCOBALAMIN) 1000 MCG TABLET    Take 1,000 mcg by mouth daily.  Modified Medications   No medications on file  Discontinued Medications   ITRACONAZOLE (SPORANOX) 100 MG CAPSULE    Take 3 capsules (300 mg total) by mouth 2 (two) times daily.    Subjective: [name] [name] is in for his routine follow-up visit. He is now been on itraconazole for 5  months for his Alternaria soft tissue infection of his forearms. Following his last visit I increased his itraconazole dose to 300 mg twice a day with meals after his itraconazole level was again found to be subtherapeutic at 0.21 g per mL. He has had no problems tolerating his current dose and has not missed any doses. He has been taking it correctly. He states that he is doing much better and that his skin has cleared and there are no remaining signs of infection.  Review of Systems: Review of Systems  Constitutional: Negative for fever, chills and diaphoresis.  Gastrointestinal: Negative for nausea, vomiting and diarrhea.  Skin: Negative for rash.    Past Medical History  Diagnosis Date  . Hypertension   . Hyperlipidemia   . Coronary artery disease     a.  s/p anterior STEMI 04/2007 with BMS-> LAD;  b. Cath 12/2011 patent stent;  c. low risk nuc 04/2014.  Marland Kitchen GERD (gastroesophageal reflux disease)   . Other esophagitis   . Diaphragmatic hernia without mention of obstruction or gangrene   . Ulcerative (chronic) ileocolitis ([place])   . Diverticulosis of colon (without mention of hemorrhage)   . Other chronic nonalcoholic liver disease   . Regional enteritis of small intestine ([place])   . Flatulence, eructation, and gas pain   . Ischemic cardiomyopathy     a. EF initially 35-40% after MI 1/09; b. echo 7/08: EF 60%;  c. 11/2014 Echo: EF 65-70%, Gr 1 DD, mild MR.  . Fatty liver     a. on ultrasound of 10/2009  . Parkinson's disease ([place])   . Gait disorder   . Memory disorder   . Osteoporosis   . Renal calculi   . Melanoma ([place])   . RBBB 09/02/2013  . HOH (hard of hearing)     Hearing aids  . Cellulitis   . Chronic diastolic CHF (congestive heart failure) ([facility])     a. EF initially 35-40% after MI 1/09; b. echo 7/08: EF 60%;  c. 11/2014 Echo: EF 65-70%, Gr 1 DD, mild MR.  . Crohn's disease ([place])   . Orthostatic hypotension   . Fungal infection     Social History  Substance Use Topics  . Smoking status: Never Smoker   . Smokeless tobacco: Never Used  . Alcohol Use: 0.0 oz/week    0 Standard drinks or equivalent per week     Comment: very rarely    Family History  Problem Relation Age of Onset  . Coronary artery disease    . Heart failure Mother   . Emphysema Father   . Heart disease Brother   . Heart disease Brother   . Colon cancer Brother   . Heart attack Mother   . Hypertension Mother     No Known Allergies  Objective: Filed Vitals:   03/01/15 1440  BP: 120/68  Pulse: 96  Weight: 167 lb 4 oz (75.864 kg)   Body mass index is 26.59 kg/(m^2).  Physical Exam  Constitutional:  He is well dressed and in good spirits as usual.  Skin:  The skin on his forearms has returned to his baseline. The nodules, erythema and ecchymoses  have resolved.    Lab Results    Problem List Items Addressed This Visit      High   Alternaria infection ([place])    Even though his itraconazole levels were subtherapeutic for much of his treatment course he has responded clinically and has no current evidence of active infection.  I will stop itraconazole now. However I will check an itraconazole level on his current dose in case he needs to be retreated in the future. He may follow-up here as needed      Relevant Orders   Miscellaneous test       Michel Bickers, MD [facility] for [place] [phone] pager   [phone] cell 03/01/2015, 3:05 PM

## 2015-03-10 LAB — ITRACONAZOLE LEVEL, HPLC
Itraconazole Lvl: 0.52 ug/mL (ref <0.05)
Results Received: 1.05 ug/mL (ref <0.05)

## 2015-03-22 ENCOUNTER — Ambulatory Visit (INDEPENDENT_AMBULATORY_CARE_PROVIDER_SITE_OTHER): Payer: Medicare Other | Admitting: Neurology

## 2015-03-22 ENCOUNTER — Encounter: Payer: Self-pay | Admitting: Neurology

## 2015-03-22 VITALS — BP 131/77 | HR 89 | Ht 69.0 in | Wt 169.0 lb

## 2015-03-22 DIAGNOSIS — N3946 Mixed incontinence: Secondary | ICD-10-CM

## 2015-03-22 DIAGNOSIS — R269 Unspecified abnormalities of gait and mobility: Secondary | ICD-10-CM

## 2015-03-22 DIAGNOSIS — R413 Other amnesia: Secondary | ICD-10-CM

## 2015-03-22 DIAGNOSIS — G2581 Restless legs syndrome: Secondary | ICD-10-CM

## 2015-03-22 DIAGNOSIS — G20 Parkinson's disease: Secondary | ICD-10-CM

## 2015-03-22 DIAGNOSIS — G20A1 Parkinson's disease without dyskinesia, without mention of fluctuations: Secondary | ICD-10-CM

## 2015-03-22 HISTORY — DX: Restless legs syndrome: G25.81

## 2015-03-22 NOTE — Patient Instructions (Signed)
Parkinson Disease Parkinson disease is a disorder of the central nervous system, which includes the brain and spinal cord. A person with this disease slowly loses the ability to completely control body movements. Within the brain, there is a group of nerve cells (basal ganglia) that help control movement. The basal ganglia are damaged and do not work properly in a person with Parkinson disease. In addition, the basal ganglia produce and use a brain chemical called dopamine. The dopamine chemical sends messages to other parts of the body to control and coordinate body movements. Dopamine levels are low in a person with Parkinson disease. If the dopamine levels are low, then the body does not receive the correct messages it needs to move normally.  CAUSES  The exact reason why the basal ganglia get damaged is not known. Some medical researchers have thought that infection, genes, environment, and certain medicines may contribute to the cause.  SYMPTOMS   An early symptom of Parkinson disease is often an uncontrolled shaking (tremor) of the hands. The tremor will often disappear when the affected hand is consciously used.  As the disease progresses, walking, talking, getting out of a chair, and new movements become more difficult.  Muscles get stiff and movements become slower.  Balance and coordination become harder.  Depression, trouble swallowing, urinary problems, constipation, and sleep problems can occur.  Later in the disease, memory and thought processes may deteriorate. DIAGNOSIS  There are no specific tests to diagnose Parkinson disease. You may be referred to a neurologist for evaluation. Your caregiver will ask about your medical history, symptoms, and perform a physical exam. Blood tests and imaging tests of your brain may be performed to rule out other diseases. The imaging tests may include an MRI or a CT scan. TREATMENT  The goal of treatment is to relieve symptoms. Medicines may be  prescribed once the symptoms become troublesome. Medicine will not stop the progression of the disease, but medicine can make movement and balance better and help control tremors. Speech and occupational therapy may also be prescribed. Sometimes, surgical treatment of the brain can be done in young people. HOME CARE INSTRUCTIONS  Get regular exercise and rest periods during the day to help prevent exhaustion and depression.  If getting dressed becomes difficult, replace buttons and zippers with Velcro and elastic on your clothing.  Take all medicine as directed by your caregiver.  Install grab bars or railings in your home to prevent falls.  Go to speech or occupational therapy as directed.  Keep all follow-up visits as directed by your caregiver. SEEK MEDICAL CARE IF:  Your symptoms are not controlled with your medicine.  You fall.  You have trouble swallowing or choke on your food. MAKE SURE YOU:  Understand these instructions.  Will watch your condition.  Will get help right away if you are not doing well or get worse.   This information is not intended to replace advice given to you by your health care provider. Make sure you discuss any questions you have with your health care provider.   Document Released: 03/22/2000 Document Revised: 07/20/2012 Document Reviewed: 04/24/2011 Elsevier Interactive Patient Education [employer].

## 2015-03-22 NOTE — Progress Notes (Signed)
Reason for visit: Parkinson's disease  [name] [name] is an 81 y.o. male  History of present illness:  [name] [name] is an 81 year old right-handed white male with a history of Parkinson's disease associated with a mild memory disturbance and a significant gait disturbance. The patient has had other medical issues that include problems with congestive heart failure. The patient also has noted some increasing problems with urinary frequency and some incontinence during the day, he indicates that he only gets up 1 time at night to use the bathroom. He has been trying to stay safe, he generally will use a walker for ambulation. He has not been having falls since last seen. He tries to remain active, he uses a Physiological scientist once a week, he does water aerobics 3 times a week, he will play an occasional game golf. He has ongoing issues with memory, he has difficulty remembering to take his medications. He is in general sleeping well at night. He does report restless legs, he denies any vivid dreams at night. He denies any problems swallowing or choking.  Past Medical History  Diagnosis Date  . Hypertension   . Hyperlipidemia   . Coronary artery disease     a. s/p anterior STEMI 04/2007 with BMS-> LAD;  b. Cath 12/2011 patent stent;  c. low risk nuc 04/2014.  Marland Kitchen GERD (gastroesophageal reflux disease)   . Other esophagitis   . Diaphragmatic hernia without mention of obstruction or gangrene   . 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 Diverticulosis of colon (without mention of hemorrhage)   . Other chronic nonalcoholic liver disease   . Regional enteritis of small intestine ([place])   . Flatulence, eructation, and gas pain   . Ischemic cardiomyopathy     a. EF initially 35-40% after MI 1/09; b. echo 7/08: EF 60%;  c. 11/2014 Echo: EF 65-70%, Gr 1 DD, mild MR.  . Fatty liver     a. on ultrasound of 10/2009  . Parkinson's disease ([place])   . Gait disorder   . Memory disorder   . Osteoporosis   .  Renal calculi   . Melanoma ([place])   . RBBB 09/02/2013  . HOH (hard of hearing)     Hearing aids  . Cellulitis   . Chronic diastolic CHF (congestive heart failure) (HCC)     a. EF initially 35-40% after MI 1/09; b. echo 7/08: EF 60%;  c. 11/2014 Echo: EF 65-70%, Gr 1 DD, mild MR.  . Crohn's disease ([place])   . Orthostatic hypotension   . Fungal infection   . Restless leg syndrome 03/22/2015    Past Surgical History  Procedure Laterality Date  . Cholecystectomy    . Appendectomy    . Back surgery      L3, L4, L5  . Tonsillectomy    . Partial bowel resection    . Coronary artery stent placement    . Melanoma resection    . Cataract extraction      Bilateral  . Cardiac catheterization  09/25/06    EF 35-40% but more recently 60%    Family History  Problem Relation Age of Onset  . Coronary artery disease    . Heart failure Mother   . Emphysema Father   . Heart disease Brother   . Heart disease Brother   . Colon cancer Brother   . Heart attack Mother   . Hypertension Mother     Social history:  reports that he has never  smoked. He has never used smokeless tobacco. He reports that he drinks about 1.2 - 1.8 oz of alcohol per week. He reports that he does not use illicit drugs.   No Known Allergies  Medications:  Prior to Admission medications   Medication Sig Start Date End Date Taking? Authorizing Provider  acetaminophen (TYLENOL) 325 MG tablet Take 650 mg by mouth every 6 (six) hours as needed for pain.   Yes Historical Provider, MD  ADVAIR DISKUS 250-50 MCG/DOSE AEPB Inhale 1 puff into the lungs daily as needed. For shortness of breath 01/24/14  Yes Historical Provider, MD  aspirin EC 81 MG tablet Take 81 mg by mouth daily.   Yes Historical Provider, MD  atorvastatin (LIPITOR) 40 MG tablet Take 40 mg by mouth daily.   Yes Historical Provider, MD  calcium carbonate (CALCIUM 600) 600 MG TABS tablet Take 600 mg by mouth daily with breakfast.   Yes Historical Provider, MD    carbidopa-levodopa (SINEMET IR) 25-100 MG per tablet Take 1.5 tablets by mouth 4 (four) times daily. 12/18/14  Yes Tiffany L Reed, DO  carvedilol (COREG) 3.125 MG tablet Take 3.125 mg by mouth 2 (two) times daily with a meal.   Yes Historical Provider, MD  Cholecalciferol (VITAMIN D-3) 1000 UNITS CAPS Take 1,000 Units by mouth daily.   Yes Historical Provider, MD  cholestyramine Lucrezia Starch) 4 G packet TAKE 1 PACKET BY MOUTH TWICE DAILY WITH A MEAL 01/24/15  Yes [name], MD  fluorouracil (EFUDEX) 5 % cream Apply topically 2 (two) times daily.   Yes Historical Provider, MD  ipratropium (ATROVENT) 0.03 % nasal spray Place 2 sprays into both nostrils 3 (three) times daily.   Yes Historical Provider, MD  loperamide (IMODIUM) 2 MG capsule Take 2 mg by mouth 2 (two) times daily.   Yes Historical Provider, MD  midodrine (PROAMATINE) 5 MG tablet Take 1 tablet (5 mg total) by mouth 2 (two) times daily with a meal. 11/23/14  Yes Dayna N Dunn, PA-C  Multiple Vitamins-Minerals (CENTRUM SILVER PO) Take 1 tablet by mouth daily.   Yes Historical Provider, MD  mupirocin cream (BACTROBAN) 2 % Apply 1 application topically 2 (two) times daily.   Yes Historical Provider, MD  naftifine (NAFTIN) 1 % cream Apply 1 application topically 2 (two) times daily.   Yes Historical Provider, MD  nitroGLYCERIN (NITROSTAT) 0.4 MG SL tablet Place 0.4 mg under the tongue every 5 (five) minutes as needed for chest pain.   Yes Historical Provider, MD  pantoprazole (PROTONIX) 40 MG tablet Take 40 mg by mouth daily.  12/30/14  Yes Historical Provider, MD  potassium chloride SA (K-DUR,KLOR-CON) 20 MEQ tablet Take 40 mEq by mouth daily.   Yes Historical Provider, MD  testosterone (ANDROGEL) 50 MG/5GM (1%) GEL Place 5 g onto the skin daily.   Yes Historical Provider, MD  torsemide (DEMADEX) 20 MG tablet Take 1 tablet (20 mg total) by mouth daily. 11/23/14  Yes Dayna N Dunn, PA-C  vitamin B-12 (CYANOCOBALAMIN) 1000 MCG tablet Take 1,000  mcg by mouth daily.   Yes Historical Provider, MD    ROS:  Out of a complete 14 system review of symptoms, the patient complains only of the following symptoms, and all other reviewed systems are negative.  Runny nose Shortness of breath Eye discharge Excessive thirst Incontinence of bladder, frequency of urination Dizziness Gait instability  Blood pressure 131/77, pulse 89, height 5' 9"  (1.753 m), weight 169 lb (76.658 kg).  Physical Exam  General:  The patient is alert and cooperative at the time of the examination.  Skin: No significant peripheral edema is noted.   Neurologic Exam  Mental status: The patient is alert and oriented x 2 at the time of the examination (not oriented to date). The patient has apparent normal recent and remote memory, with an apparently normal attention span and concentration ability. Mini-Mental Status Examination done today shows a total score of 29/30. The patient is able to name 5 animals in 30 seconds.   Cranial nerves: Facial symmetry is present. Speech is normal, no aphasia or dysarthria is noted. Extraocular movements are full. Visual fields are full. Mild masking of the face is seen.   Motor: The patient has good strength in all 4 extremities.  Sensory examination: Soft touch sensation is symmetric on the face, arms, and legs.  Coordination: The patient has good finger-nose-finger and heel-to-shin bilaterally.  Gait and station: The patient  is able to arise from a seated position with arms crossed with some difficulty. Once up, he is able to ambulate without assistance, he has slightly decreased arm swing. Can gait was not attempted. Usually, he uses a walker for ambulation. Romberg is negative. No drift is seen.  Reflexes: Deep tendon reflexes are symmetric.   Assessment/Plan:  1. Parkinson's disease   2. Gait disturbance   3. Mild memory disturbance   4. Restless leg syndrome   5. Urinary urgency and incontinence   The  patient will be sent for a urology evaluation for the bladder symptoms. The patient is doing a lot of exercise, working on balance, and he is remaining safe. He will continue his current dose of Sinemet taking 1.5 tablets 4 times daily. He will follow-up in 5 months. I have encouraged him to continue his current level of exercise. We will follow the memory issues over time, he has mild issues currently. He is not operating motor a vehicle at this time. He lives in an independent living situation currently.   Jill Alexanders MD 03/22/2015 7:21 PM  [facility] [address] [place], [place] [address]  Phone [phone] Fax [phone]

## 2015-03-28 ENCOUNTER — Encounter: Payer: Self-pay | Admitting: Gastroenterology

## 2015-04-05 LAB — BASIC METABOLIC PANEL
BUN: 18 mg/dL (ref 7–25)
CO2: 25 mmol/L (ref 20–31)
Calcium: 8.7 mg/dL (ref 8.6–10.3)
Chloride: 99 mmol/L (ref 98–110)
Creat: 0.94 mg/dL (ref 0.70–1.11)
Glucose, Bld: 107 mg/dL — ABNORMAL HIGH (ref 65–99)
Potassium: 3.7 mmol/L (ref 3.5–5.3)
Sodium: 140 mmol/L (ref 135–146)

## 2015-04-11 ENCOUNTER — Telehealth: Payer: Self-pay | Admitting: Cardiology

## 2015-04-11 NOTE — Telephone Encounter (Signed)
Returned call to patient's wife no answer.[place].

## 2015-04-11 NOTE — Telephone Encounter (Signed)
Returning your call. °

## 2015-04-11 NOTE — Telephone Encounter (Signed)
Pt's wife is returning a call from a nurse on 12/30 in regards to some blood work. Please f/u with her  Thanks

## 2015-04-11 NOTE — Telephone Encounter (Signed)
Returned call to patient.Advised 04/07/15 lab good,continue same medications.Advised to keep appointment with Dr.Jordan 04/25/15 at 10:30 am.

## 2015-04-25 ENCOUNTER — Ambulatory Visit (INDEPENDENT_AMBULATORY_CARE_PROVIDER_SITE_OTHER): Payer: Medicare Other | Admitting: Cardiology

## 2015-04-25 ENCOUNTER — Encounter: Payer: Self-pay | Admitting: Cardiology

## 2015-04-25 VITALS — BP 126/80 | HR 74 | Ht 69.0 in | Wt 170.0 lb

## 2015-04-25 DIAGNOSIS — I2510 Atherosclerotic heart disease of native coronary artery without angina pectoris: Secondary | ICD-10-CM

## 2015-04-25 DIAGNOSIS — I10 Essential (primary) hypertension: Secondary | ICD-10-CM

## 2015-04-25 DIAGNOSIS — I5031 Acute diastolic (congestive) heart failure: Secondary | ICD-10-CM | POA: Diagnosis not present

## 2015-04-25 DIAGNOSIS — I255 Ischemic cardiomyopathy: Secondary | ICD-10-CM

## 2015-04-25 DIAGNOSIS — I5032 Chronic diastolic (congestive) heart failure: Secondary | ICD-10-CM | POA: Diagnosis not present

## 2015-04-25 NOTE — Patient Instructions (Signed)
Continue your current therapy  I will see you again in 4 months.

## 2015-04-25 NOTE — Progress Notes (Signed)
Cardiology Office Note Date:  04/25/2015  Patient ID:  Davison, Ohms Jun 19, 1928, MRN [id number] PCP:  [name], MD  Cardiologist:  Dr. [name]   Chief Complaint: f/u CHF  History of Present Illness: [name] [name] [name] is a 82 y.o. male with history of CAD (s/p anterior STEMI 04/2007 with BMS-> LAD, patent 12/2011, low risk nuc 04/2014), chronic dCHF, HTN, HLD, Crohn's disease, GERD, Parkinson's disease, cellulitis, orthostatic hypotension requiring midodrine, GERD, RBBB, fatty liver, gait disorder, chronic lower extremity edema who presents for follow up CHF.Marland Kitchen He was admitted in August with worsening LEE as well as dizziness felt 2/2 orthostatic hypotension in [name] [name] setting of poor oral intake. His acute on chronic diastolic CHF was felt multifactorial largely from dietary indiscretion with sodium restricted diet and recent steroid use for Crohn's. Morbid obesity was also felt to be playing a role in marked LE edema. His orthostasis was also felt possibly compounded by mild adrenal insufficiency given borderline low cortisol (weaned off steroids 8 weeks prior to admit for Crohn's), age, autonomic insufficiency (Parkinson's), medication related (Sinemet, alpha blockers). Midodrine was continued. Given his CHF, salt loading and Florinef were not advised. LE duplex neg for DVT. Cardiology also recommended TED hose as tolerated + elevation.  2D echo 11/14/14: mild focal basal and mild concentric hypertrophy of septum, LVEF 65-70%, no RWMA, grade 1 DD, mild MR. Nuc 05/06/14: normal, LVEF normal. He was seen back later that month with increased edema. He was switched to Torsemide with excellent diuretic response.  On follow up today he is doing very well. He was treated for a fungal cellulitis of [name] [name] arm and this has cleared. He is not SOB and his leg edema has stayed down. Weighing daily and weight stable. Still struggles some with high sodium foods but is trying harder. Felt well enough to play 9 holes of  golf last week. Leg fatigue is still an issue. He is having problems with incontinence and was started on Rapaflo one week ago by Dr. Jeffie Pollock.   Past Medical History  Diagnosis Date  . Hypertension   . Hyperlipidemia   . Coronary artery disease     a. s/p anterior STEMI 04/2007 with BMS-> LAD;  b. Cath 12/2011 patent stent;  c. low risk nuc 04/2014.  Marland Kitchen GERD (gastroesophageal reflux disease)   . Other esophagitis   . Diaphragmatic hernia without mention of obstruction or gangrene   . 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 Diverticulosis of colon (without mention of hemorrhage)   . Other chronic nonalcoholic liver disease   . Regional enteritis of small intestine ([place])   . Flatulence, eructation, and gas pain   . Ischemic cardiomyopathy     a. EF initially 35-40% after MI 1/09; b. echo 7/08: EF 60%;  c. 11/2014 Echo: EF 65-70%, Gr 1 DD, mild MR.  . Fatty liver     a. on ultrasound of 10/2009  . Parkinson's disease ([place])   . Gait disorder   . Memory disorder   . Osteoporosis   . Renal calculi   . Melanoma ([place])   . RBBB 09/02/2013  . HOH (hard of hearing)     Hearing aids  . Cellulitis   . Chronic diastolic CHF (congestive heart failure) (HCC)     a. EF initially 35-40% after MI 1/09; b. echo 7/08: EF 60%;  c. 11/2014 Echo: EF 65-70%, Gr 1 DD, mild MR.  . Crohn's disease ([place])   . Orthostatic hypotension   .  Fungal infection   . Restless leg syndrome 03/22/2015    Past Surgical History  Procedure Laterality Date  . Cholecystectomy    . Appendectomy    . Back surgery      L3, L4, L5  . Tonsillectomy    . Partial bowel resection    . Coronary artery stent placement    . Melanoma resection    . Cataract extraction      Bilateral  . Cardiac catheterization  09/25/06    EF 35-40% but more recently 60%    Current Outpatient Prescriptions  Medication Sig Dispense Refill  . acetaminophen (TYLENOL) 325 MG tablet Take 650 mg by mouth every 6 (six) hours as needed for pain.    Marland Kitchen  ADVAIR DISKUS 250-50 MCG/DOSE AEPB Inhale 1 puff into [name] [name] lungs daily as needed. For shortness of breath    . aspirin EC 81 MG tablet Take 81 mg by mouth daily.    Marland Kitchen atorvastatin (LIPITOR) 40 MG tablet Take 40 mg by mouth daily.    . calcium carbonate (CALCIUM 600) 600 MG TABS tablet Take 600 mg by mouth daily with breakfast.    . carbidopa-levodopa (SINEMET IR) 25-100 MG per tablet Take 1.5 tablets by mouth 4 (four) times daily. 450 tablet 1  . carvedilol (COREG) 3.125 MG tablet Take 3.125 mg by mouth 2 (two) times daily with a meal.    . Cholecalciferol (VITAMIN D-3) 1000 UNITS CAPS Take 1,000 Units by mouth daily.    . cholestyramine (QUESTRAN) 4 G packet TAKE 1 PACKET BY MOUTH TWICE DAILY WITH A MEAL 60 each 11  . ipratropium (ATROVENT) 0.03 % nasal spray Place 2 sprays into both nostrils 3 (three) times daily.    Marland Kitchen loperamide (IMODIUM) 2 MG capsule Take 2 mg by mouth 2 (two) times daily.    . midodrine (PROAMATINE) 5 MG tablet Take 1 tablet (5 mg total) by mouth 2 (two) times daily with a meal. 60 tablet 9  . Multiple Vitamins-Minerals (CENTRUM SILVER PO) Take 1 tablet by mouth daily.    . nitroGLYCERIN (NITROSTAT) 0.4 MG SL tablet Place 0.4 mg under [name] [name] tongue every 5 (five) minutes as needed for chest pain.    . pantoprazole (PROTONIX) 40 MG tablet Take 40 mg by mouth daily.     . potassium chloride SA (K-DUR,KLOR-CON) 20 MEQ tablet Take 40 mEq by mouth daily.    . silodosin (RAPAFLO) 8 MG CAPS capsule Take 8 mg by mouth daily with breakfast.    . testosterone (ANDROGEL) 50 MG/5GM (1%) GEL Place 5 g onto [name] [name] skin daily.    Marland Kitchen torsemide (DEMADEX) 20 MG tablet Take 1 tablet (20 mg total) by mouth daily. 180 tablet 3  . vitamin B-12 (CYANOCOBALAMIN) 1000 MCG tablet Take 1,000 mcg by mouth daily.     No current facility-administered medications for this visit.    Allergies:   Review of patient's allergies indicates no known allergies.   Social History:  [name] [name] patient  reports that he has  never smoked. He has never used smokeless tobacco. He reports that he drinks about 1.2 - 1.8 oz of alcohol per week. He reports that he does not use illicit drugs.   Family History:  [name] [name] patient's family history includes Colon cancer in his brother; Emphysema in his father; Heart attack in his mother; Heart disease in his brother and brother; Heart failure in his mother; Hypertension in his mother.  ROS:  Please see [name] [name] history of present illness.  All other systems are reviewed and otherwise negative.   PHYSICAL EXAM:  VS:  BP 126/80 mmHg  Pulse 74  Ht 5' 9"  (1.753 m)  Wt 77.111 kg (170 lb)  BMI 25.09 kg/m2 BMI: Body mass index is 25.09 kg/(m^2). O2 sat 93% Well nourished, well developed WM in no acute distress HEENT: normocephalic, atraumatic Neck: no JVD, carotid bruits or masses Cardiac:  normal S1, S2; reg rhythm with mildly elevated rate; no murmurs, rubs, or gallops Lungs:  Bilateral crackles at bases, no wheezing, rhonchi or rales Abd: rounded, distended, +BS, nontender MS: no deformity or atrophy Ext: Trace bilateral LE edema Skin: warm and dry, no rash Neuro:  moves all extremities spontaneously, no focal abnormalities noted, follows commands Psych: euthymic mood, somewhat flat affect   EKG:  Not done today.  Recent Labs: 11/10/2014: B Natriuretic Peptide 160.2*; Hemoglobin 13.9 11/12/2014: TSH 2.428 12/13/2014: ALT 17; ALT 15; Platelets 325 04/05/2015: BUN 18; Creat 0.94; Potassium 3.7; Sodium 140  No results found for requested labs within last 365 days.   Estimated Creatinine Clearance: 58.5 mL/min (by C-G formula based on Cr of 0.94).   Wt Readings from Last 3 Encounters:  04/25/15 77.111 kg (170 lb)  03/22/15 76.658 kg (169 lb)  03/01/15 75.864 kg (167 lb 4 oz)     Other studies reviewed: Additional studies/records reviewed today include: summarized above  ASSESSMENT AND PLAN:  1. Chronic diastolic CHF - excellent response to torsemide and appears well  compensated today. Continue Coreg and torsemide at current dose. I am reluctant to increase Coreg further with history of orthostatic hypotension and recent addition of Rapaflo. 2. History of essential HTN with orthostatic hypotension. Seems to be tolerating Rapaflo well so far. Will need to continue to monitor.  3. CAD s/p anterior MI 2009 with BMS to LAD. Cath 2013 showed patent stent. Normal Myoview Jan 2016.  Asymptomatic. 4. Hyponatremia  5. Parkinson's disease 6. Chron's disease.  Disposition: F/u in 4 months  Current medicines are reviewed at length with [name] [name] patient today.  [name] [name] patient did not have any concerns regarding medicines.  Signed, Peter [name] MD, [facility]    04/25/2015 4:36 PM     [facility]

## 2015-06-12 ENCOUNTER — Ambulatory Visit (INDEPENDENT_AMBULATORY_CARE_PROVIDER_SITE_OTHER): Payer: Medicare Other | Admitting: Gastroenterology

## 2015-06-12 ENCOUNTER — Encounter: Payer: Self-pay | Admitting: Gastroenterology

## 2015-06-12 VITALS — BP 126/74 | HR 84 | Ht 69.0 in | Wt 170.8 lb

## 2015-06-12 DIAGNOSIS — K50919 Crohn's disease, unspecified, with unspecified complications: Secondary | ICD-10-CM

## 2015-06-12 NOTE — Patient Instructions (Signed)
Please return to see Dr. Ardis Hughs in 3 months, sooner if needed. No plans for restarted remicade at this point.

## 2015-06-12 NOTE — Progress Notes (Signed)
GI PROBLEM LIST:  1. Crohn's disease. Distant ileal and right colon resection by Dr. Gretta Cool in the very distant past. He was maintained on Pentasa, Entocort, and 67m of Purinethol for several years under the care of Dr. SLyla Son Hospitalization, May 2008, for acute myocardial infarction complicated by small bowel obstruction likely due to active Crohn's. Increased Purinethol to 100 mg daily and liver tests increased as well. Purinethol held and liver test normalized. The patient felt much better overall as well (less diarrhea, less fatigue). Winter, 2009: currently on 6 pills of Pentasa day feeling quite well overall. Summer, 2010 postoperative ileus following spine surgery (doubt active Crohn's flare). November, 2011: started cholestyramine with very good results (loose stools MUCH improved, only going 3-4 times a day). 03/2014 rov with increasing loose stools, urgency; started on uceris, levsin. Repeat colonoscopy 4 2016 showed anastomotic ulceration, narrowing of the ileocecal anastomosis. Biopsies showed chronic and active inflammation. He was started on Remicade 5 mg/kg after TB testing and hepatitis testing were proven to be negative with very good response to the remicade. June 2016, holding Remicade due to fungal cellulitis. 01/2015 continuing to hold remicade, fungus remains a problem 2. Dysphagia February 2012: Barium esophagram showed mild narrowing at his GE junction. Was planning to perform dilation off Plavix (if okay with his cardiologist) when he had a bowel obstruction.  3. partial small bowel obstruction, February 2012. CT suggested transition point in the pelvis. Not clear if there was active Crohn's disease contributing to this, however he was put on prednisone in hosp; started 36ma day, taper off quickly. Recurrent obstruction 09/2012: CT scan IMPRESSION: Small bowel obstruction with apparent change in caliber within the anterior right lower quadrant - right upper pelvis most  likely due to an adhesion. Was admitted to hosp, put on steroids empirically however seems more likely to have been adhesive related than due to active IBD.  HPI: This is a   very pleasant 82ear old man whom I last saw about 4 months ago  Chief complaint is Crohn's disease  Itraconazole stopped about 3 months ago.  The fungus rash cleared up, has not returned.  His bowels have been fine.  Normally has 2 solid bms daily, non bloody.  No abd pains.      Past Medical History  Diagnosis Date  . Hypertension   . Hyperlipidemia   . Coronary artery disease     a. s/p anterior STEMI 04/2007 with BMS-> LAD;  b. Cath 12/2011 patent stent;  c. low risk nuc 04/2014.  . Marland KitchenERD (gastroesophageal reflux disease)   . Other esophagitis   . Diaphragmatic hernia without mention of obstruction or gangrene   . Ulcerative (chronic) ileocolitis ([facility]  . Diverticulosis of colon (without mention of hemorrhage)   . Other chronic nonalcoholic liver disease   . Regional enteritis of small intestine ([facility]  . Flatulence, eructation, and gas pain   . Ischemic cardiomyopathy     a. EF initially 35-40% after MI 1/09; b. echo 7/08: EF 60%;  c. 11/2014 Echo: EF 65-70%, Gr 1 DD, mild MR.  . Fatty liver     a. on ultrasound of 10/2009  . Parkinson's disease ([facility]  . Gait disorder   . Memory disorder   . Osteoporosis   . Renal calculi   . Melanoma ([facility]  . RBBB 09/02/2013  . HOH (hard of hearing)     Hearing aids  . Cellulitis   . Chronic diastolic CHF (congestive  heart failure) ([place])     a. EF initially 35-40% after MI 1/09; b. echo 7/08: EF 60%;  c. 11/2014 Echo: EF 65-70%, Gr 1 DD, mild MR.  . Crohn's disease ([place])   . Orthostatic hypotension   . Fungal infection   . Restless leg syndrome 03/22/2015    Past Surgical History  Procedure Laterality Date  . Cholecystectomy    . Appendectomy    . Back surgery      L3, L4, L5  . Tonsillectomy    . Partial bowel resection    . Coronary artery stent  placement    . Melanoma resection    . Cataract extraction      Bilateral  . Cardiac catheterization  09/25/06    EF 35-40% but more recently 60%    Current Outpatient Prescriptions  Medication Sig Dispense Refill  . acetaminophen (TYLENOL) 325 MG tablet Take 650 mg by mouth every 6 (six) hours as needed for pain.    Marland Kitchen ADVAIR DISKUS 250-50 MCG/DOSE AEPB Inhale 1 puff into the lungs daily as needed. For shortness of breath    . aspirin EC 81 MG tablet Take 81 mg by mouth daily.    Marland Kitchen atorvastatin (LIPITOR) 40 MG tablet Take 40 mg by mouth daily.    . calcium carbonate (CALCIUM 600) 600 MG TABS tablet Take 600 mg by mouth daily with breakfast.    . carbidopa-levodopa (SINEMET IR) 25-100 MG per tablet Take 1.5 tablets by mouth 4 (four) times daily. 450 tablet 1  . carvedilol (COREG) 3.125 MG tablet Take 3.125 mg by mouth 2 (two) times daily with a meal.    . Cholecalciferol (VITAMIN D-3) 1000 UNITS CAPS Take 1,000 Units by mouth daily.    . cholestyramine (QUESTRAN) 4 G packet TAKE 1 PACKET BY MOUTH TWICE DAILY WITH A MEAL 60 each 11  . ipratropium (ATROVENT) 0.03 % nasal spray Place 2 sprays into both nostrils 3 (three) times daily.    Marland Kitchen loperamide (IMODIUM) 2 MG capsule Take 2 mg by mouth 2 (two) times daily.    . midodrine (PROAMATINE) 5 MG tablet Take 1 tablet (5 mg total) by mouth 2 (two) times daily with a meal. 60 tablet 9  . Multiple Vitamins-Minerals (CENTRUM SILVER PO) Take 1 tablet by mouth daily.    . nitroGLYCERIN (NITROSTAT) 0.4 MG SL tablet Place 0.4 mg under the tongue every 5 (five) minutes as needed for chest pain.    . pantoprazole (PROTONIX) 40 MG tablet Take 40 mg by mouth daily.     . potassium chloride SA (K-DUR,KLOR-CON) 20 MEQ tablet Take 40 mEq by mouth daily.    . silodosin (RAPAFLO) 8 MG CAPS capsule Take 8 mg by mouth daily with breakfast.    . testosterone (ANDROGEL) 50 MG/5GM (1%) GEL Place 5 g onto the skin daily.    Marland Kitchen torsemide (DEMADEX) 20 MG tablet Take 1  tablet (20 mg total) by mouth daily. 180 tablet 3  . vitamin B-12 (CYANOCOBALAMIN) 1000 MCG tablet Take 1,000 mcg by mouth daily.     No current facility-administered medications for this visit.    Allergies as of 06/12/2015  . (No Known Allergies)    Family History  Problem Relation Age of Onset  . Coronary artery disease    . Heart failure Mother   . Emphysema Father   . Heart disease Brother   . Heart disease Brother   . Colon cancer Brother   . Heart attack Mother   .  Hypertension Mother     Social History   Social History  . Marital Status: Married    Spouse Name: N/A  . Number of Children: 3  . Years of Education: college   Occupational History  . CEO-retired   . CEO    Social History Main Topics  . Smoking status: Never Smoker   . Smokeless tobacco: Never Used  . Alcohol Use: 1.2 - 1.8 oz/week    2-3 Standard drinks or equivalent per week  . Drug Use: No  . Sexual Activity: Not on file   Other Topics Concern  . Not on file   Social History Narrative   Patient is right handed.   Patient drinks very little caffeine.     Physical Exam: BP 126/74 mmHg  Pulse 84  Ht 5' 9"  (1.753 m)  Wt 170 lb 12.8 oz (77.474 kg)  BMI 25.21 kg/m2 Constitutional: generally well-appearing Psychiatric: alert and oriented x3 Abdomen: soft, nontender, nondistended, no obvious ascites, no peritoneal signs, normal bowel sounds   Assessment and plan: 82 y.o. male with Crohn's disease  He has been off itraconazole now for about 3 months. He really has not been on any specific Crohn's therapies since last summer when the Remicade was stopped for fungal cellulitis. I am very reluctant to restart Remicade since he had only been on it for 2 or 3 months before the fungus infection set in. Fortunately his bowels have been under very good control with Questran only. He takes one dose twice daily. On this he has 2 solid bowel movements a day without blood, no abdominal pains. No  symptoms of recurrent small bowel obstructions. He is walking with a walker now. He is getting more frail over time. He will return to see me in 3 months and sooner if needed for now no specific Crohn's treatments he will continue on Questran twice daily.   Owens Loffler, MD [facility] 06/12/2015, 9:08 AM

## 2015-08-09 ENCOUNTER — Other Ambulatory Visit: Payer: Self-pay | Admitting: Neurology

## 2015-08-24 ENCOUNTER — Encounter: Payer: Self-pay | Admitting: Cardiology

## 2015-08-24 ENCOUNTER — Ambulatory Visit (INDEPENDENT_AMBULATORY_CARE_PROVIDER_SITE_OTHER): Payer: Medicare Other | Admitting: Cardiology

## 2015-08-24 VITALS — BP 138/74 | HR 91 | Ht 69.0 in | Wt 172.0 lb

## 2015-08-24 DIAGNOSIS — I2510 Atherosclerotic heart disease of native coronary artery without angina pectoris: Secondary | ICD-10-CM

## 2015-08-24 DIAGNOSIS — I951 Orthostatic hypotension: Secondary | ICD-10-CM

## 2015-08-24 NOTE — Patient Instructions (Signed)
Continue your current therapy  I will see you in 6 months.   

## 2015-08-24 NOTE — Progress Notes (Signed)
Cardiology Office Note Date:  08/24/2015  Patient ID:  [name] [name], [name] [name] [birth year], MRN [id number] PCP:  [name], MD  Cardiologist:  Dr. [name]   Chief Complaint: f/u CHF  History of Present Illness: [name] [name] is a 82 y.o. male with history of CAD (s/p anterior STEMI 04/2007 with BMS-> LAD, patent 12/2011, low risk nuc 04/2014), chronic dCHF, HTN, HLD, Crohn's disease, GERD, Parkinson's disease, cellulitis, orthostatic hypotension requiring midodrine, GERD, RBBB, fatty liver, gait disorder, chronic lower extremity edema who presents for follow up.  He is seen with his wife today. He feels well. Denies any SOB or chest pain. He is not having significant orthostatic symptoms. If BP is <110 he holds his Coreg. He does have severe incontinence and is being treated with a novel bladder program. His opthalmologist reported a swollen optic nerve but he denies any visual changes. He lower extremity edema is well controlled.   2D echo 11/14/14: mild focal basal and mild concentric hypertrophy of septum, LVEF 65-70%, no RWMA, grade 1 DD, mild MR. Nuc 05/06/14: normal, LVEF normal.    Past Medical History  Diagnosis Date  . Hypertension   . Hyperlipidemia   . Coronary artery disease     a. s/p anterior STEMI 04/2007 with BMS-> LAD;  b. Cath 12/2011 patent stent;  c. low risk nuc 04/2014.  Marland Kitchen GERD (gastroesophageal reflux disease)   . Other esophagitis   . Diaphragmatic hernia without mention of obstruction or gangrene   . Ulcerative (chronic) ileocolitis ([place])   . Diverticulosis of colon (without mention of hemorrhage)   . Other chronic nonalcoholic liver disease   . Regional enteritis of small intestine ([place])   . Flatulence, eructation, and gas pain   . Ischemic cardiomyopathy     a. EF initially 35-40% after MI 1/09; b. echo 7/08: EF 60%;  c. 11/2014 Echo: EF 65-70%, Gr 1 DD, mild MR.  . Fatty liver     a. on ultrasound of 10/2009  . Parkinson's disease ([place])   . Gait disorder   . Memory  disorder   . Osteoporosis   . Renal calculi   . Melanoma ([place])   . RBBB 09/02/2013  . HOH (hard of hearing)     Hearing aids  . Cellulitis   . Chronic diastolic CHF (congestive heart failure) ([facility])     a. EF initially 35-40% after MI 1/09; b. echo 7/08: EF 60%;  c. 11/2014 Echo: EF 65-70%, Gr 1 DD, mild MR.  . Crohn's disease ([place])   . Orthostatic hypotension   . Fungal infection   . Restless leg syndrome 03/22/2015    Past Surgical History  Procedure Laterality Date  . Cholecystectomy    . Appendectomy    . Back surgery      L3, L4, L5  . Tonsillectomy    . Partial bowel resection    . Coronary artery stent placement    . Melanoma resection    . Cataract extraction      Bilateral  . Cardiac catheterization  09/25/06    EF 35-40% but more recently 60%    Current Outpatient Prescriptions  Medication Sig Dispense Refill  . acetaminophen (TYLENOL) 325 MG tablet Take 650 mg by mouth every 6 (six) hours as needed for pain.    Marland Kitchen ADVAIR DISKUS 250-50 MCG/DOSE AEPB Inhale 1 puff into the lungs daily as needed. For shortness of breath    . aspirin EC 81 MG tablet Take 81 mg by  mouth daily.    Marland Kitchen atorvastatin (LIPITOR) 40 MG tablet Take 40 mg by mouth daily.    . calcium carbonate (CALCIUM 600) 600 MG TABS tablet Take 600 mg by mouth daily with breakfast.    . carbidopa-levodopa (SINEMET IR) 25-100 MG tablet TAKE 1+1/2 TABLET FOUR TIMES DAILY 540 tablet 0  . carvedilol (COREG) 3.125 MG tablet Take 3.125 mg by mouth daily.     . Cholecalciferol (VITAMIN D-3) 1000 UNITS CAPS Take 1,000 Units by mouth daily.    . cholestyramine (QUESTRAN) 4 G packet TAKE 1 PACKET BY MOUTH TWICE DAILY WITH A MEAL 60 each 11  . loperamide (IMODIUM) 2 MG capsule Take 2 mg by mouth 2 (two) times daily.    . midodrine (PROAMATINE) 5 MG tablet Take 1 tablet (5 mg total) by mouth 2 (two) times daily with a meal. 60 tablet 9  . Multiple Vitamins-Minerals (CENTRUM SILVER PO) Take 1 tablet by mouth daily.    .  nitroGLYCERIN (NITROSTAT) 0.4 MG SL tablet Place 0.4 mg under the tongue every 5 (five) minutes as needed for chest pain.    . pantoprazole (PROTONIX) 40 MG tablet Take 40 mg by mouth daily.     . potassium chloride SA (K-DUR,KLOR-CON) 20 MEQ tablet Take 40 mEq by mouth daily.    Marland Kitchen torsemide (DEMADEX) 20 MG tablet Take 1 tablet (20 mg total) by mouth daily. 180 tablet 3  . vitamin B-12 (CYANOCOBALAMIN) 1000 MCG tablet Take 1,000 mcg by mouth daily.     No current facility-administered medications for this visit.    Allergies:   Review of patient's allergies indicates no known allergies.   Social History:  The patient  reports that he has never smoked. He has never used smokeless tobacco. He reports that he drinks about 1.2 - 1.8 oz of alcohol per week. He reports that he does not use illicit drugs.   Family History:  The patient's family history includes Colon cancer in his brother; Emphysema in his father; Heart attack in his mother; Heart disease in his brother and brother; Heart failure in his mother; Hypertension in his mother.  ROS:  Please see the history of present illness.   His Crohn's disease is in remission. Fugal cellulitis has cleared.  All other systems are reviewed and otherwise negative.   PHYSICAL EXAM:  VS:  BP 138/74 mmHg  Pulse 91  Ht 5' 9"  (1.753 m)  Wt 78.019 kg (172 lb)  BMI 25.39 kg/m2 BMI: Body mass index is 25.39 kg/(m^2). O2 sat 93% Well nourished, well developed WM in no acute distress HEENT: normocephalic, atraumatic Neck: no JVD, carotid bruits or masses Cardiac:  normal S1, S2; reg rhythm with mildly elevated rate; no murmurs, rubs, or gallops Lungs:  Clear, no wheezing, rhonchi or rales Abd: rounded, distended, +BS, nontender MS: no deformity or atrophy Ext: No LE edema Skin: warm and dry, no rash Neuro:  moves all extremities spontaneously, no focal abnormalities noted, follows commands Psych: euthymic mood, somewhat flat affect   EKG:  Not is  done today. NSR rate 91. RBBB. I have personally reviewed and interpreted this study.   Recent Labs: 11/10/2014: B Natriuretic Peptide 160.2*; Hemoglobin 13.9 11/12/2014: TSH 2.428 12/13/2014: ALT 17; ALT 15; Platelets 325 04/05/2015: BUN 18; Creat 0.94; Potassium 3.7; Sodium 140  No results found for requested labs within last 365 days.   CrCl cannot be calculated (Patient has no serum creatinine result on file.).   Wt Readings from Last 3  Encounters:  08/24/15 78.019 kg (172 lb)  06/12/15 77.474 kg (170 lb 12.8 oz)  04/25/15 77.111 kg (170 lb)     Other studies reviewed: Additional studies/records reviewed today include: summarized above  ASSESSMENT AND PLAN:  1. Chronic diastolic CHF - excellent response to torsemide and appears well compensated today on 20 mg daily. Continue low dose Coreg and torsemide at current dose.  2. History of essential HTN with orthostatic hypotension. Controlled on current regimen. Will need to continue to monitor.  3. CAD s/p anterior MI 2009 with BMS to LAD. Cath 2013 showed patent stent. Normal Myoview Jan 2016.  Asymptomatic. 4. Hyponatremia  5.   Parkinson's disease 6.   Chron's disease.  Disposition: F/u in 6 months  Current medicines are reviewed at length with the patient today.  The patient did not have any concerns regarding medicines.  Signed, Peter [name] MD, [facility]    08/24/2015 11:56 AM     [facility]

## 2015-08-25 ENCOUNTER — Encounter: Payer: Self-pay | Admitting: Neurology

## 2015-08-25 ENCOUNTER — Ambulatory Visit (INDEPENDENT_AMBULATORY_CARE_PROVIDER_SITE_OTHER): Payer: Medicare Other | Admitting: Neurology

## 2015-08-25 VITALS — BP 114/62 | HR 80 | Ht 69.0 in | Wt 170.5 lb

## 2015-08-25 DIAGNOSIS — R413 Other amnesia: Secondary | ICD-10-CM | POA: Diagnosis not present

## 2015-08-25 DIAGNOSIS — G20 Parkinson's disease: Secondary | ICD-10-CM | POA: Diagnosis not present

## 2015-08-25 DIAGNOSIS — R269 Unspecified abnormalities of gait and mobility: Secondary | ICD-10-CM

## 2015-08-25 DIAGNOSIS — G2581 Restless legs syndrome: Secondary | ICD-10-CM | POA: Diagnosis not present

## 2015-08-25 MED ORDER — DONEPEZIL HCL 5 MG PO TABS: 5.0000 mg | ORAL_TABLET | Freq: Every day | ORAL | Status: DC

## 2015-08-25 NOTE — Patient Instructions (Signed)
   Begin Aricept (donepezil) at 5 mg at night for one month. If this medication is well-tolerated, please call our office and we will call in a prescription for the 10 mg tablets. Look out for side effects that may include nausea, diarrhea, weight loss, or stomach cramps. This medication will also cause a runny nose, therefore there is no need for allergy medications for this purpose.  

## 2015-08-25 NOTE — Progress Notes (Signed)
Reason for visit: Parkinson's disease  [name] [name] is an 82 y.o. male  History of present illness:  [name] is an 82 year old right-handed white male with a history of Parkinson's disease associated with a gait disorder and a mild memory disorder. The patient has remained active. He works out on a regular basis and he plays golf. He is good about using a walker to ambulate to prevent falls. He has done well in this regard. He does not operate a motor vehicle at this time, but he wants to get back to doing this. His wife has concerns about his judgment and safety with driving. The patient recently was found to have some edema of the optic disc on the left, the etiology is not clear. The patient himself has not noted any significant changes in his vision. The patient has had some difficulty controlling the bladder, and he is now getting acupuncture treatments on the heel to help improve bladder function. The patient himself does not believe that his memory has changed much, his wife believes that it has progressed some since last seen. He returns for an evaluation. He indicates that he is sleeping well at night.  Past Medical History  Diagnosis Date  . Hypertension   . Hyperlipidemia   . Coronary artery disease     a. s/p anterior STEMI 04/2007 with BMS-> LAD;  b. Cath 12/2011 patent stent;  c. low risk nuc 04/2014.  Marland Kitchen GERD (gastroesophageal reflux disease)   . Other esophagitis   . Diaphragmatic hernia without mention of obstruction or gangrene   . Ulcerative (chronic) ileocolitis ([place])   . Diverticulosis of colon (without mention of hemorrhage)   . Other chronic nonalcoholic liver disease   . Regional enteritis of small intestine ([place])   . Flatulence, eructation, and gas pain   . Ischemic cardiomyopathy     a. EF initially 35-40% after MI 1/09; b. echo 7/08: EF 60%;  c. 11/2014 Echo: EF 65-70%, Gr 1 DD, mild MR.  . Fatty liver     a. on ultrasound of 10/2009  . Parkinson's disease  ([place])   . Gait disorder   . Memory disorder   . Osteoporosis   . Renal calculi   . Melanoma ([place])   . RBBB 09/02/2013  . HOH (hard of hearing)     Hearing aids  . Cellulitis   . Chronic diastolic CHF (congestive heart failure) ([facility])     a. EF initially 35-40% after MI 1/09; b. echo 7/08: EF 60%;  c. 11/2014 Echo: EF 65-70%, Gr 1 DD, mild MR.  . Crohn's disease ([place])   . Orthostatic hypotension   . Fungal infection   . Restless leg syndrome 03/22/2015  . Urinary incontinence     Past Surgical History  Procedure Laterality Date  . Cholecystectomy    . Appendectomy    . Back surgery      L3, L4, L5  . Tonsillectomy    . Partial bowel resection    . Coronary artery stent placement    . Melanoma resection    . Cataract extraction      Bilateral  . Cardiac catheterization  09/25/06    EF 35-40% but more recently 60%    Family History  Problem Relation Age of Onset  . Coronary artery disease    . Heart failure Mother   . Emphysema Father   . Heart disease Brother   . Heart disease Brother   . Colon cancer  Brother   . Heart attack Mother   . Hypertension Mother     Social history:  reports that he has never smoked. He has never used smokeless tobacco. He reports that he drinks about 1.2 - 1.8 oz of alcohol per week. He reports that he does not use illicit drugs.   No Known Allergies  Medications:  Prior to Admission medications   Medication Sig Start Date End Date Taking? Authorizing Provider  acetaminophen (TYLENOL) 325 MG tablet Take 650 mg by mouth every 6 (six) hours as needed for pain.   Yes Historical Provider, MD  ADVAIR DISKUS 250-50 MCG/DOSE AEPB Inhale 1 puff into the lungs daily as needed. For shortness of breath 01/24/14  Yes Historical Provider, MD  aspirin EC 81 MG tablet Take 81 mg by mouth daily.   Yes Historical Provider, MD  atorvastatin (LIPITOR) 40 MG tablet Take 40 mg by mouth daily.   Yes Historical Provider, MD  calcium carbonate (CALCIUM 600) 600  MG TABS tablet Take 600 mg by mouth daily with breakfast.   Yes Historical Provider, MD  carbidopa-levodopa (SINEMET IR) 25-100 MG tablet TAKE 1+1/2 TABLET FOUR TIMES DAILY 08/09/15  Yes [name], MD  carvedilol (COREG) 3.125 MG tablet Take 3.125 mg by mouth daily.    Yes Historical Provider, MD  Cholecalciferol (VITAMIN D-3) 1000 UNITS CAPS Take 1,000 Units by mouth daily.   Yes Historical Provider, MD  cholestyramine Lucrezia Starch) 4 G packet TAKE 1 PACKET BY MOUTH TWICE DAILY WITH A MEAL 01/24/15  Yes [name], MD  loperamide (IMODIUM) 2 MG capsule Take 2 mg by mouth 2 (two) times daily.   Yes Historical Provider, MD  midodrine (PROAMATINE) 5 MG tablet Take 1 tablet (5 mg total) by mouth 2 (two) times daily with a meal. 11/23/14  Yes Dayna N Dunn, PA-C  Multiple Vitamins-Minerals (CENTRUM SILVER PO) Take 1 tablet by mouth daily.   Yes Historical Provider, MD  nitroGLYCERIN (NITROSTAT) 0.4 MG SL tablet Place 0.4 mg under the tongue every 5 (five) minutes as needed for chest pain.   Yes Historical Provider, MD  pantoprazole (PROTONIX) 40 MG tablet Take 40 mg by mouth daily.  12/30/14  Yes Historical Provider, MD  potassium chloride SA (K-DUR,KLOR-CON) 20 MEQ tablet Take 40 mEq by mouth daily.   Yes Historical Provider, MD  torsemide (DEMADEX) 20 MG tablet Take 1 tablet (20 mg total) by mouth daily. 11/23/14  Yes Dayna N Dunn, PA-C  vitamin B-12 (CYANOCOBALAMIN) 1000 MCG tablet Take 1,000 mcg by mouth daily.   Yes Historical Provider, MD  donepezil (ARICEPT) 5 MG tablet Take 1 tablet (5 mg total) by mouth at bedtime. 08/25/15   [name], MD    ROS:  Out of a complete 14 system review of symptoms, the patient complains only of the following symptoms, and all other reviewed systems are negative.  Fatigue Cough Incontinence of the bladder, frequency of urination Walking difficulty Memory loss, numbness Agitation, decreased concentration, anxiety  Blood pressure 114/62, pulse 80,  height 5' 9"  (1.753 m), weight 170 lb 8 oz (77.338 kg).  Physical Exam  General: The patient is alert and cooperative at the time of the examination.  Skin: No significant peripheral edema is noted.   Neurologic Exam  Mental status: The patient is alert and oriented x 3 at the time of the examination. The patient has apparent normal recent and remote memory, with an apparently normal attention span and concentration ability. Mini-Mental Status Examination  done today shows a total score 29/30.   Cranial nerves: Facial symmetry is present. Speech is normal, no aphasia or dysarthria is noted. Extraocular movements are full. Visual fields are full.  Motor: The patient has good strength in all 4 extremities.  Sensory examination: Soft touch sensation is symmetric on the face, arms, and legs.  Coordination: The patient has good finger-nose-finger and heel-to-shin bilaterally.  Gait and station: The patient is able to arise from a seated position with arms crossed. Once up, the patient is able to ambulate independently, gait is slow, decreased arm swing noted. Turns are fair. Tandem gait was not tested. Romberg is negative. No drift is seen.  Reflexes: Deep tendon reflexes are symmetric.   Assessment/Plan:  1. Parkinson's disease  2. Gait disorder  3. Memory disorder  4. Incontinence of bladder  The patient will be given a trial on low-dose Aricept at this time for the memory. He will continue on his current Sinemet dose, he will follow-up in 5 months. He is to remain active, he is doing a good job with this and he has been able to maintain his level of physical activity.  Jill Alexanders MD 08/25/2015 3:37 PM  [facility] [address] [place], [place] [address]  Phone [phone] Fax [phone]

## 2015-08-30 ENCOUNTER — Telehealth: Payer: Self-pay | Admitting: Neurology

## 2015-08-30 NOTE — Telephone Encounter (Signed)
Pt's wife called said pt told her that when Dr Jannifer Franklin took him out in the hall to observe his gait Dr Jannifer Franklin told him it was ok to drive. She does not believe this to be true and could she get some clarification. Please call on her cell any time after 10:30 [phone]

## 2015-08-30 NOTE — Telephone Encounter (Signed)
I called. The last note indicates that there were concerns about safety and judgment with driving, the note indicates that he was no longer operating motor vehicle. I did not indicate to him that it was okay to start driving again. In general, if there are concerns with safety, is usually best not to have that person drive.

## 2015-09-19 ENCOUNTER — Telehealth: Payer: Self-pay | Admitting: Neurology

## 2015-09-19 ENCOUNTER — Encounter: Payer: Self-pay | Admitting: Gastroenterology

## 2015-09-19 ENCOUNTER — Ambulatory Visit (INDEPENDENT_AMBULATORY_CARE_PROVIDER_SITE_OTHER): Payer: Medicare Other | Admitting: Gastroenterology

## 2015-09-19 VITALS — BP 108/66 | HR 84 | Ht 67.0 in | Wt 169.2 lb

## 2015-09-19 DIAGNOSIS — K5090 Crohn's disease, unspecified, without complications: Secondary | ICD-10-CM

## 2015-09-19 NOTE — Patient Instructions (Signed)
Please return to see Dr. Ardis Hughs in 4-5 months, sooner if needed. Continue questran twice daily for now.

## 2015-09-19 NOTE — Progress Notes (Signed)
GI PROBLEM LIST:  1. Crohn's disease. Distant ileal and right colon resection by Dr. Gretta Cool in the very distant past. He was maintained on Pentasa, Entocort, and 27m of Purinethol for several years under the care of Dr. SLyla Son Hospitalization, May 2008, for acute myocardial infarction complicated by small bowel obstruction likely due to active Crohn's. Increased Purinethol to 100 mg daily and liver tests increased as well. Purinethol held and liver test normalized. The patient felt much better overall as well (less diarrhea, less fatigue). Winter, 2009: currently on 6 pills of Pentasa day feeling quite well overall. Summer, 2010 postoperative ileus following spine surgery (doubt active Crohn's flare). November, 2011: started cholestyramine with very good results (loose stools MUCH improved, only going 3-4 times a day). 03/2014 rov with increasing loose stools, urgency; started on uceris, levsin. Repeat colonoscopy 4 2016 showed anastomotic ulceration, narrowing of the ileocecal anastomosis. Biopsies showed chronic and active inflammation. He was started on Remicade 5 mg/kg after TB testing and hepatitis testing were proven to be negative with very good response to the remicade. June 2016, holding Remicade due to fungal cellulitis. 01/2015 continuing to hold remicade, fungus remains a problem 2. Dysphagia February 2012: Barium esophagram showed mild narrowing at his GE junction. Was planning to perform dilation off Plavix (if okay with his cardiologist) when he had a bowel obstruction.  3. partial small bowel obstruction, February 2012. CT suggested transition point in the pelvis. Not clear if there was active Crohn's disease contributing to this, however he was put on prednisone in hosp; started 366ma day, taper off quickly. Recurrent obstruction 09/2012: CT scan IMPRESSION: Small bowel obstruction with apparent change in caliber within the anterior right lower quadrant - right upper pelvis most  likely due to an adhesion. Was admitted to hosp, put on steroids empirically however seems more likely to have been adhesive related than due to active IBD.   HPI: This is a  very pleasant 82 year old man whom I last saw about 3 months ago  Occasionally has a 'blowout' per his wife.  Normally he has one BM twice a day. Non-bloody, rarely diarrhea.  Usually formed, solid.    The rash never returned.  No abd pains.  Chief complaint is Crohn's disease   Past Medical History  Diagnosis Date  . Hypertension   . Hyperlipidemia   . Coronary artery disease     a. s/p anterior STEMI 04/2007 with BMS-> LAD;  b. Cath 12/2011 patent stent;  c. low risk nuc 04/2014.  . Marland KitchenERD (gastroesophageal reflux disease)   . Other esophagitis   . Diaphragmatic hernia without mention of obstruction or gangrene   . Ulcerative (chronic) ileocolitis ([facility]  . Diverticulosis of colon (without mention of hemorrhage)   . Other chronic nonalcoholic liver disease   . Regional enteritis of small intestine ([facility] [place]  . Flatulence, eructation, and gas pain   . Ischemic cardiomyopathy     a. EF initially 35-40% after MI 1/09; b. echo 7/08: EF 60%;  c. 11/2014 Echo: EF 65-70%, Gr 1 DD, mild MR.  . Fatty liver     a. on ultrasound of 10/2009  . Parkinson's disease ([place]  . Gait disorder   . Memory disorder   . Osteoporosis   . Renal calculi   . Melanoma ([facility]  . RBBB 09/02/2013  . HOH (hard of hearing)     Hearing aids  . Cellulitis   . Chronic diastolic CHF (congestive heart failure) ([facility]  a. EF initially 35-40% after MI 1/09; b. echo 7/08: EF 60%;  c. 11/2014 Echo: EF 65-70%, Gr 1 DD, mild MR.  . Crohn's disease ([place])   . Orthostatic hypotension   . Fungal infection   . Restless leg syndrome 03/22/2015  . Urinary incontinence     Past Surgical History  Procedure Laterality Date  . Cholecystectomy    . Appendectomy    . Back surgery      L3, L4, L5  . Tonsillectomy    . Partial bowel resection    .  Coronary artery stent placement    . Melanoma resection    . Cataract extraction      Bilateral  . Cardiac catheterization  09/25/06    EF 35-40% but more recently 60%    Current Outpatient Prescriptions  Medication Sig Dispense Refill  . acetaminophen (TYLENOL) 325 MG tablet Take 650 mg by mouth every 6 (six) hours as needed for pain.    Marland Kitchen ADVAIR DISKUS 250-50 MCG/DOSE AEPB Inhale 1 puff into the lungs daily as needed. For shortness of breath    . aspirin EC 81 MG tablet Take 81 mg by mouth daily.    Marland Kitchen atorvastatin (LIPITOR) 40 MG tablet Take 40 mg by mouth daily.    . calcium carbonate (CALCIUM 600) 600 MG TABS tablet Take 600 mg by mouth daily with breakfast.    . carbidopa-levodopa (SINEMET IR) 25-100 MG tablet TAKE 1+1/2 TABLET FOUR TIMES DAILY 540 tablet 0  . carvedilol (COREG) 3.125 MG tablet Take 3.125 mg by mouth daily.     . Cholecalciferol (VITAMIN D-3) 1000 UNITS CAPS Take 1,000 Units by mouth daily.    . cholestyramine (QUESTRAN) 4 G packet TAKE 1 PACKET BY MOUTH TWICE DAILY WITH A MEAL 60 each 11  . donepezil (ARICEPT) 5 MG tablet Take 1 tablet (5 mg total) by mouth at bedtime. 30 tablet 1  . loperamide (IMODIUM) 2 MG capsule Take 2 mg by mouth 2 (two) times daily.    . midodrine (PROAMATINE) 5 MG tablet Take 1 tablet (5 mg total) by mouth 2 (two) times daily with a meal. 60 tablet 9  . Multiple Vitamins-Minerals (CENTRUM SILVER PO) Take 1 tablet by mouth daily.    . nitroGLYCERIN (NITROSTAT) 0.4 MG SL tablet Place 0.4 mg under the tongue every 5 (five) minutes as needed for chest pain.    . pantoprazole (PROTONIX) 40 MG tablet Take 40 mg by mouth daily.     . potassium chloride SA (K-DUR,KLOR-CON) 20 MEQ tablet Take 40 mEq by mouth daily.    Marland Kitchen torsemide (DEMADEX) 20 MG tablet Take 1 tablet (20 mg total) by mouth daily. 180 tablet 3  . vitamin B-12 (CYANOCOBALAMIN) 1000 MCG tablet Take 1,000 mcg by mouth daily.     No current facility-administered medications for this visit.     Allergies as of 09/19/2015  . (No Known Allergies)    Family History  Problem Relation Age of Onset  . Coronary artery disease    . Heart failure Mother   . Emphysema Father   . Heart disease Brother   . Heart disease Brother   . Colon cancer Brother   . Heart attack Mother   . Hypertension Mother     Social History   Social History  . Marital Status: Married    Spouse Name: N/A  . Number of Children: 3  . Years of Education: college   Occupational History  . CEO-retired   .  CEO    Social History Main Topics  . Smoking status: Never Smoker   . Smokeless tobacco: Never Used  . Alcohol Use: 1.2 - 1.8 oz/week    2-3 Standard drinks or equivalent per week     Comment: occ   . Drug Use: No  . Sexual Activity: Not on file   Other Topics Concern  . Not on file   Social History Narrative   Lives w/ his wife at [place]   Patient is right handed.   Patient drinks very little caffeine.     Physical Exam: BP 108/66 mmHg  Pulse 84  Ht 5' 7"  (1.702 m)  Wt 169 lb 4 oz (76.771 kg)  BMI 26.50 kg/m2 Constitutional: generally well-appearing Psychiatric: alert and oriented x3 Abdomen: soft, nontender, nondistended, no obvious ascites, no peritoneal signs, normal bowel sounds   Assessment and plan: 82 y.o. male with Crohn's disease  He is off Crohn's therapy currently. I do believe that the Remicade which she was taking last year we can his immune system to the point of him getting the fungal skin infection. Remicade was stopped and fortunately his bowels have not returned to the frequent diarrhea that he was bothered by. I'm inclined not to start immune modulator therapy or any other specific Crohn's medicines unless he declines clinically from a GI perspective. He knows to call here if he has significant diarrhea. For now he will continue on Questran twice daily. He'll return to see me in 3-4 months. Sooner if needed   Owens Loffler,  MD [facility] 09/19/2015, 11:25 AM

## 2015-09-19 NOTE — Telephone Encounter (Signed)
Dr. Jannifer Franklin has already sent rx to pharmacy.  Returned call to his wife and she will pick it up for him.

## 2015-09-19 NOTE — Telephone Encounter (Signed)
Patient's wife is calling and states her husband has tolerated the donzepil 10 mg samples given to him.  She would like a Rx called in to [facility].   Thanks!

## 2015-10-19 ENCOUNTER — Other Ambulatory Visit: Payer: Self-pay | Admitting: Neurology

## 2015-10-23 ENCOUNTER — Telehealth: Payer: Self-pay | Admitting: Neurology

## 2015-10-23 NOTE — Telephone Encounter (Signed)
Called back w/ rx instructions for donepezil. Refills were retailed to [facility] last Friday. May call back w/ further questions/concerns.

## 2015-10-23 NOTE — Telephone Encounter (Signed)
[facility] [phone], please call regarding donepezil (ARICEPT) 5 MG tablet, if Zigmund Daniel is unavailable, ask for Seth Bake.

## 2015-10-24 MED ORDER — DONEPEZIL HCL 10 MG PO TABS: 10.0000 mg | ORAL_TABLET | Freq: Every day | ORAL | Status: DC

## 2015-10-24 NOTE — Telephone Encounter (Signed)
Per previous notes, pt tolerated titration of Aricept up to 10 mg. Spoke to pt's wife and verified that pt is still tolerating higher dose (10 mg per day) and new rx e-scribed to pt's pharmacy. Copy of order faxed to [facility] for med management.

## 2015-10-24 NOTE — Addendum Note (Signed)
Addended by: Monte Fantasia on: 10/24/2015 11:24 AM   Modules accepted: Orders

## 2015-11-08 ENCOUNTER — Other Ambulatory Visit: Payer: Self-pay | Admitting: Neurology

## 2015-12-04 IMAGING — CT CT HEAD W/O CM
2 series · 16 of 30 positions shown, 20 images · non-contrast
Comparison: 12/30/2011

CLINICAL DATA: Parkinson's disease, status post fall, slurred
speech

EXAM:
CT HEAD WITHOUT CONTRAST
TECHNIQUE: Contiguous axial images were obtained from the base of the skull
through the vertex without intravenous contrast.

[Series 2: head w/o · axial · non-contrast · 0.45mm/px · z∈[-92,+38]mm · 13 of 32 slices shown, 17 images]
[im 3/32  brain]
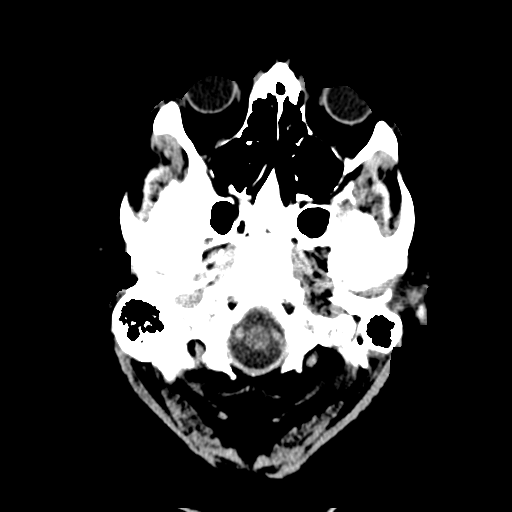
[im 3/32  bone]
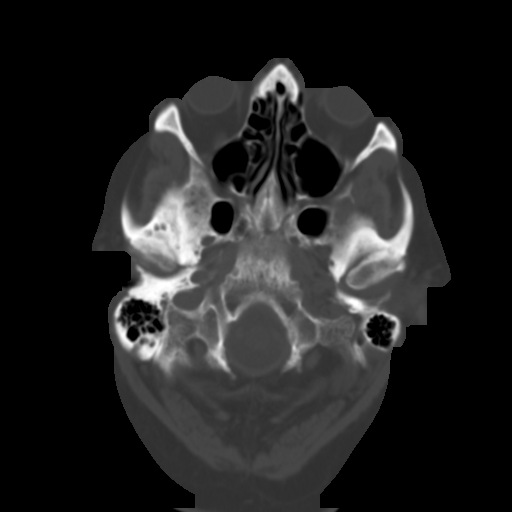
[im 5/32  brain]
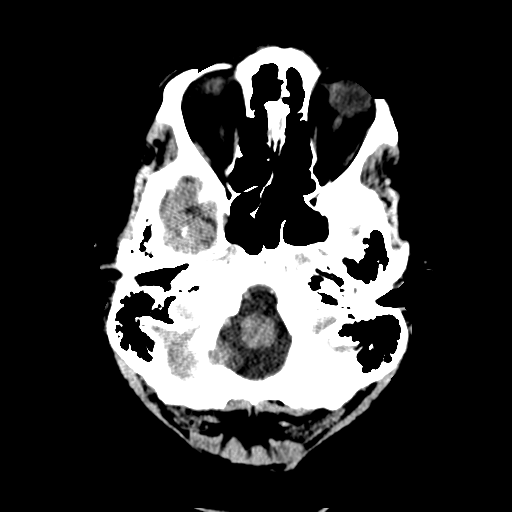
[im 7/32  brain]
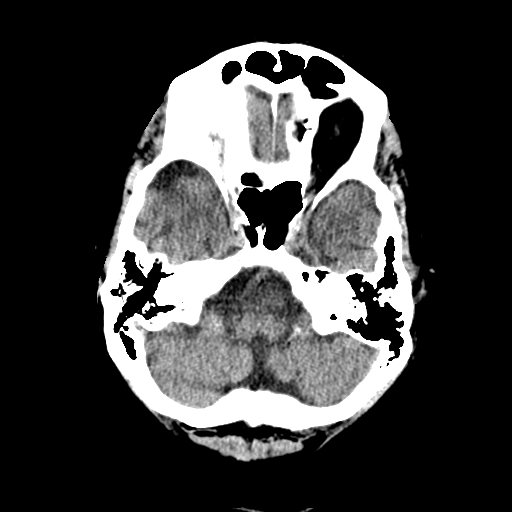
[im 9/32  brain]
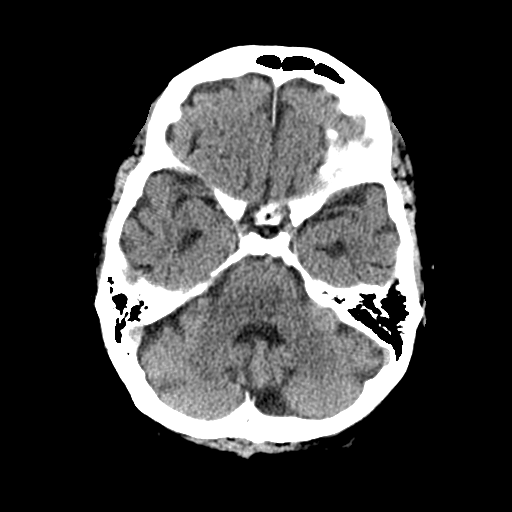
[im 12/32  brain]
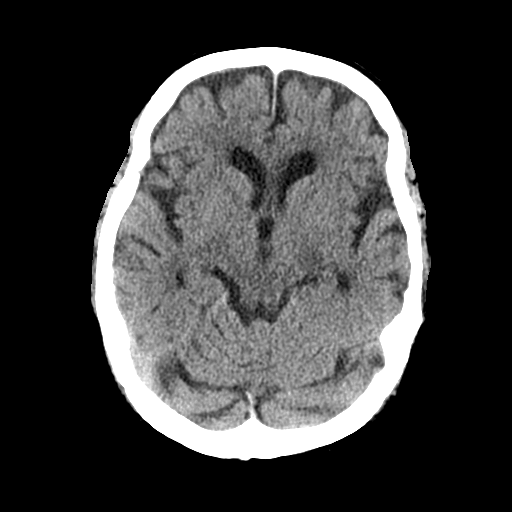
[im 12/32  bone]
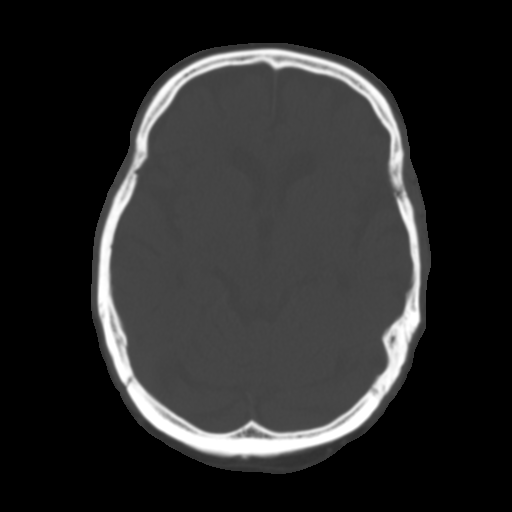
[im 14/32  brain]
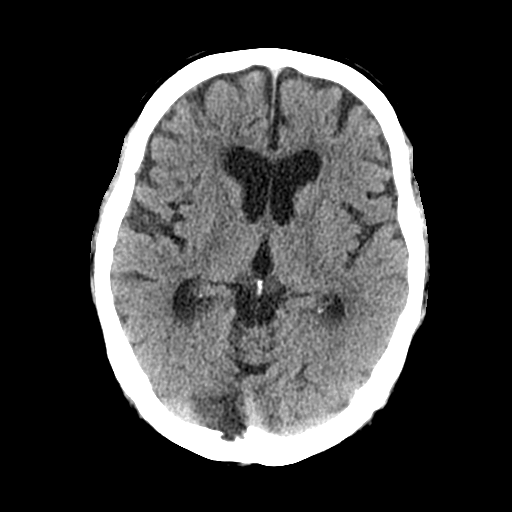
[im 16/32  brain]
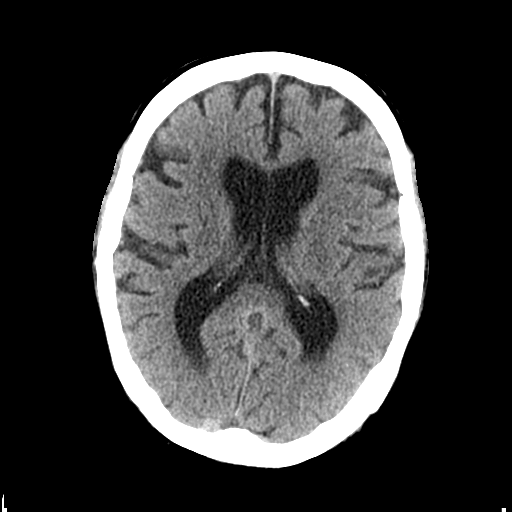
[im 18/32  brain]
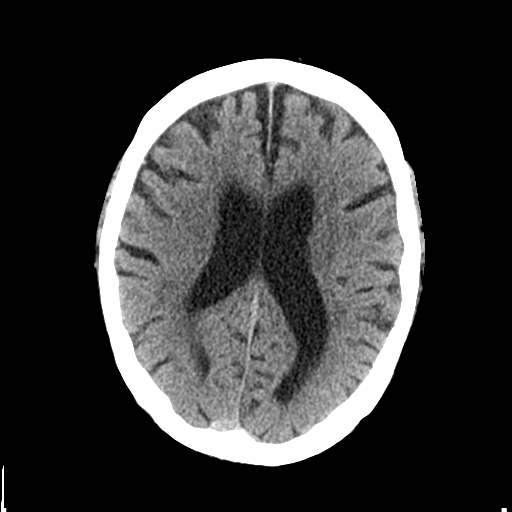
[im 20/32  brain]
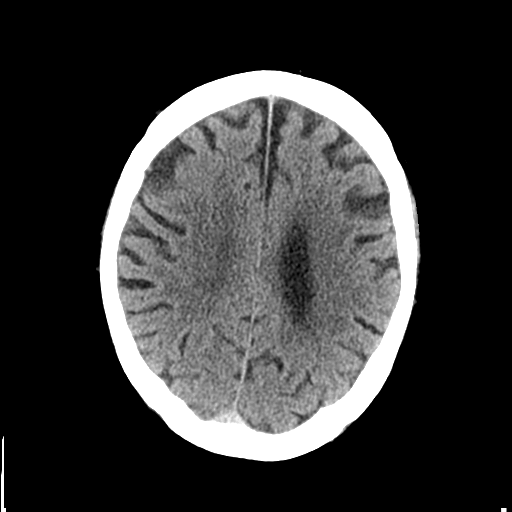
[im 20/32  bone]
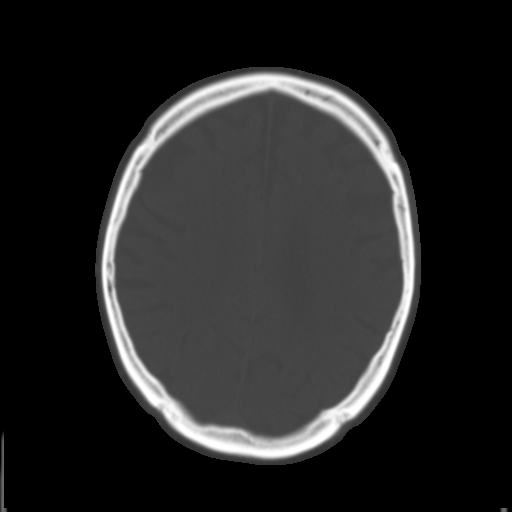
[im 23/32  brain]
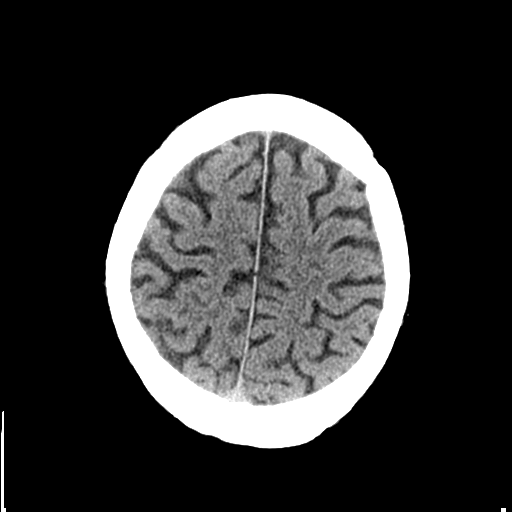
[im 25/32  brain]
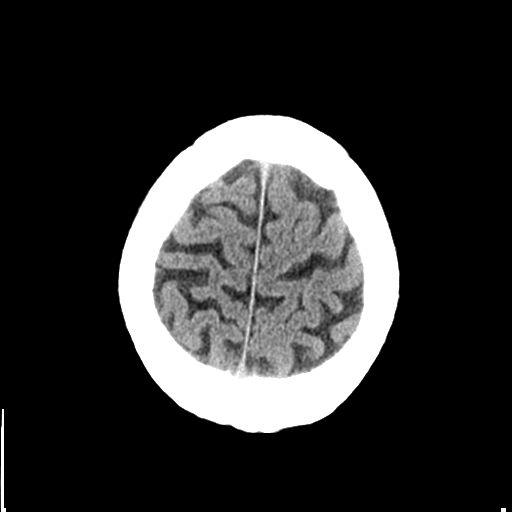
[im 27/32  brain]
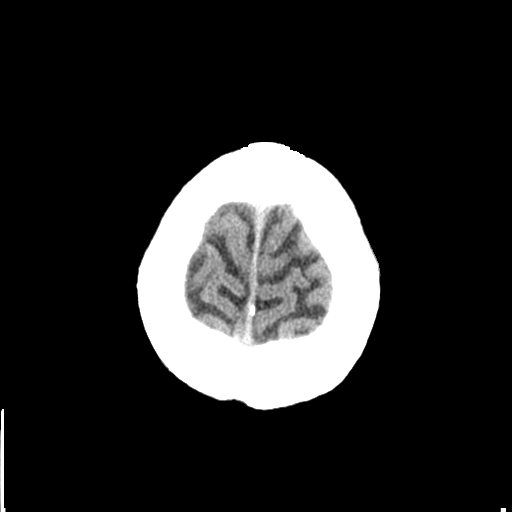
[im 29/32  brain]
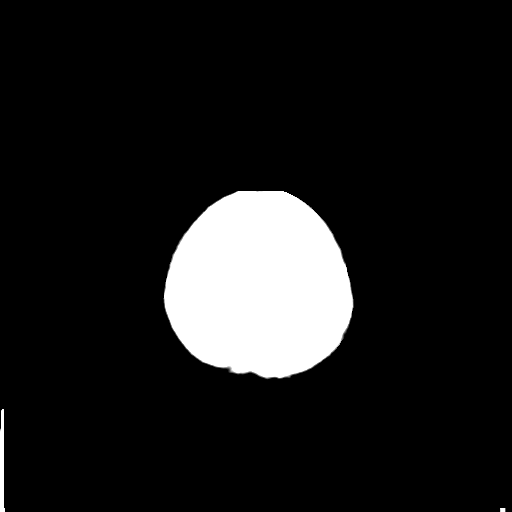
[im 29/32  bone]
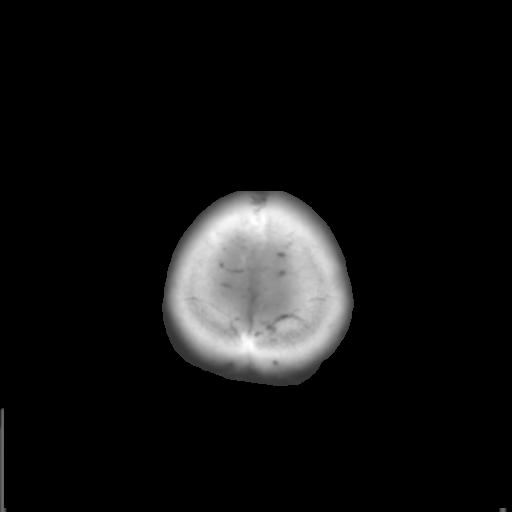

[Series 3: bone windows · axial · 0.45mm/px · z∈[-92,-47]mm · 3 of 32 slices shown]
[im 3/32  bone]
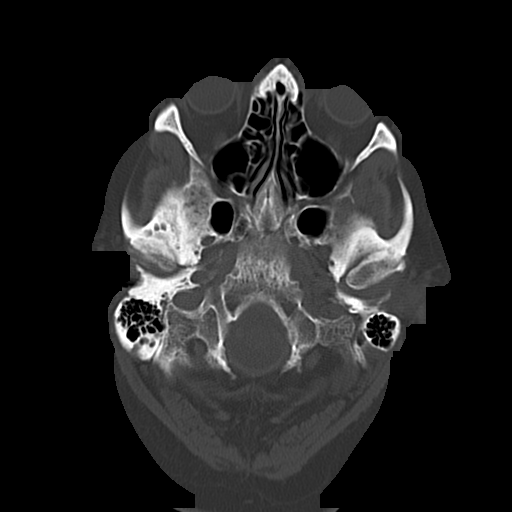
[im 7/32  bone]
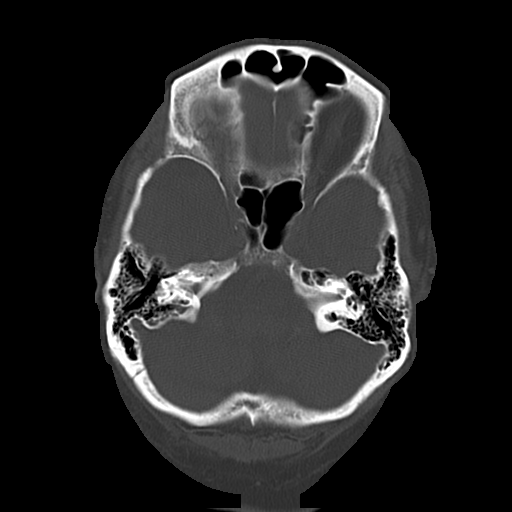
[im 12/32  bone]
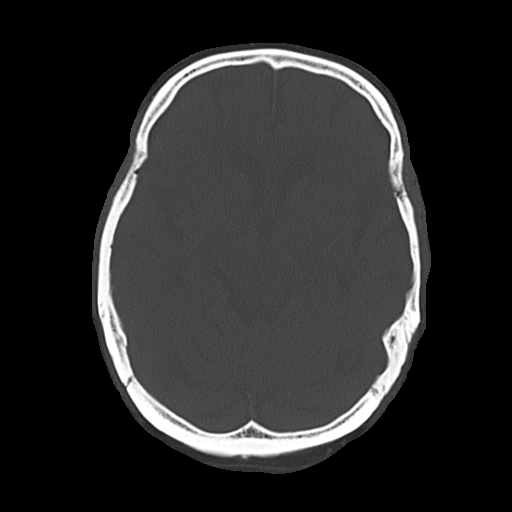

[16 of 30 positions shown; findings below may reference images not displayed]

FINDINGS: No evidence of parenchymal hemorrhage or extra-axial fluid
collection. No mass lesion, mass effect, or midline shift.

No CT evidence of acute infarction.

Subcortical white matter and periventricular small vessel ischemic
changes.

Mild cortical atrophy.  No ventriculomegaly.

The visualized paranasal sinuses are essentially clear. The mastoid
air cells are unopacified.

No evidence of calvarial fracture.
IMPRESSION: No evidence of acute intracranial abnormality.

Atrophy with small vessel ischemic changes.

## 2015-12-04 IMAGING — CT CT ANGIO CHEST
2 of 6 series · 19 of 36 positions shown · IV contrast (OMNIPAQUE 350)
Comparison: Chest radiographs dated 09/05/2014

CLINICAL DATA: Right arm pain, shortness of breath, evaluate for PE

EXAM:
CT ANGIOGRAPHY CHEST WITH CONTRAST
TECHNIQUE: Multidetector CT imaging of the chest was performed using the
standard protocol during bolus administration of intravenous
contrast. Multiplanar CT image reconstructions and MIPs were
obtained to evaluate the vascular anatomy.
CONTRAST:  100mL OMNIPAQUE IOHEXOL 350 MG/ML SOLN

[Series 7: thins for pacs · axial · 0.73mm/px · z∈[-222,+7]mm · 18 of 255 slices shown]
[im 13/255  lung]
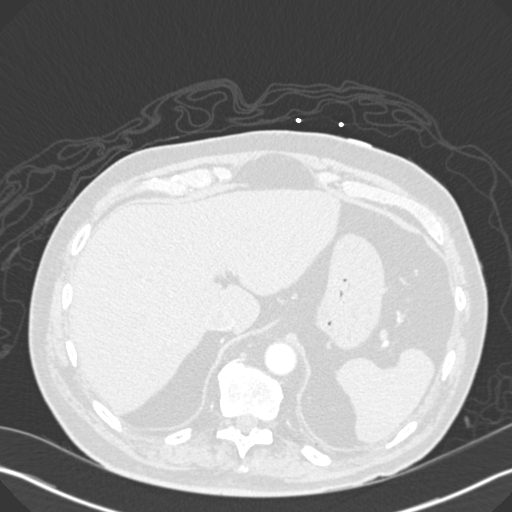
[im 26/255  mediastinal]
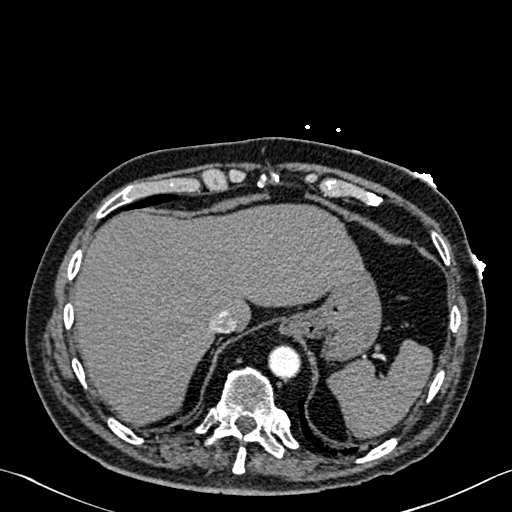
[im 39/255  lung]
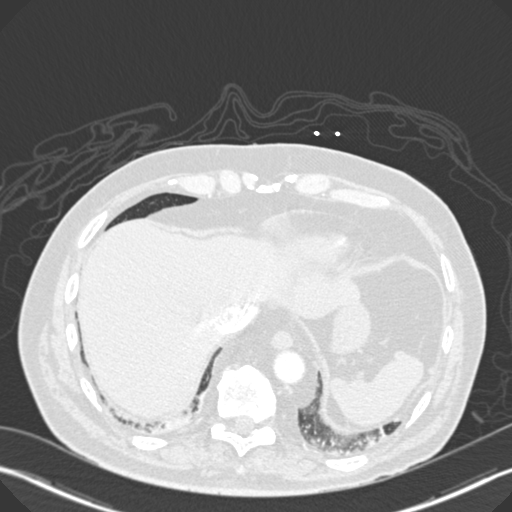
[im 51/255  mediastinal]
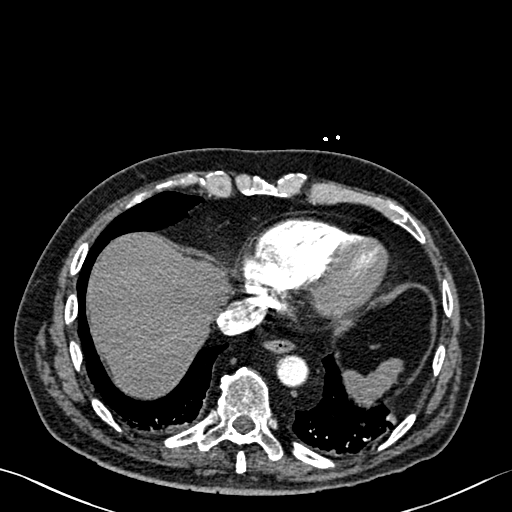
[im 64/255  lung]
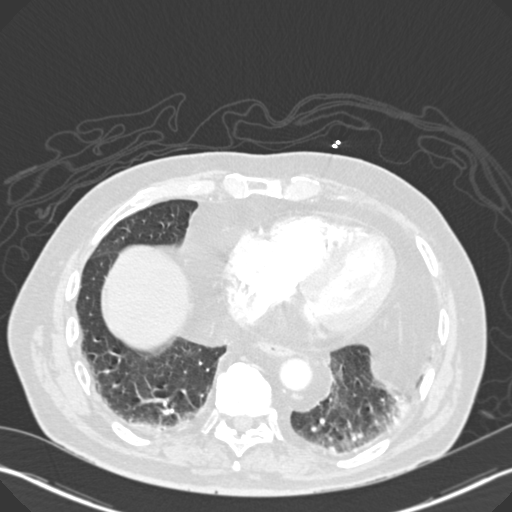
[im 77/255  mediastinal]
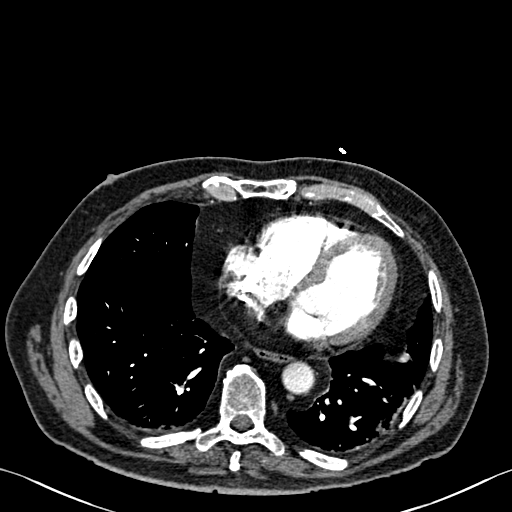
[im 89/255  lung]
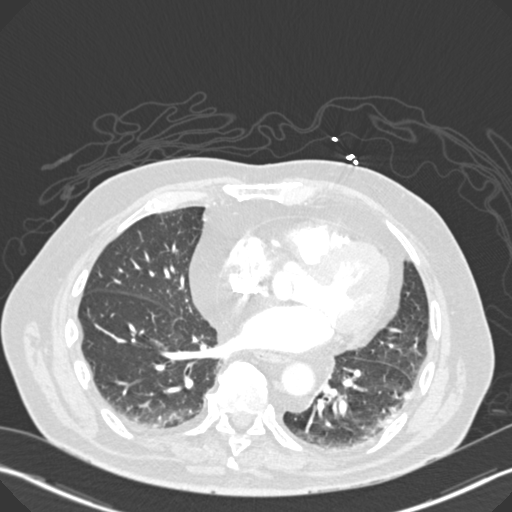
[im 102/255  mediastinal]
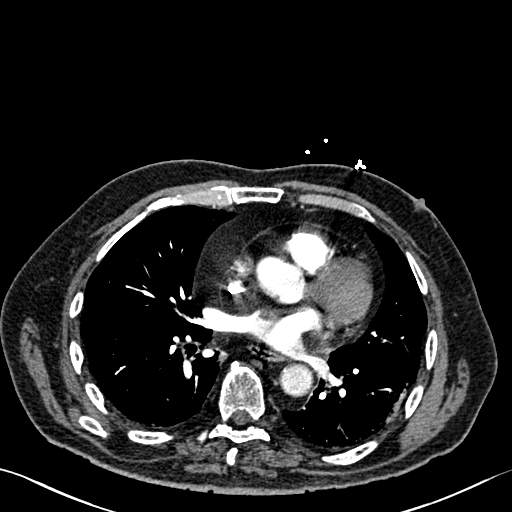
[im 115/255  lung]
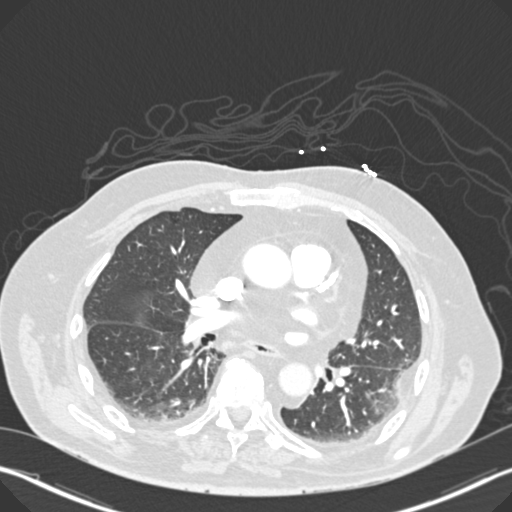
[im 140/255  mediastinal]
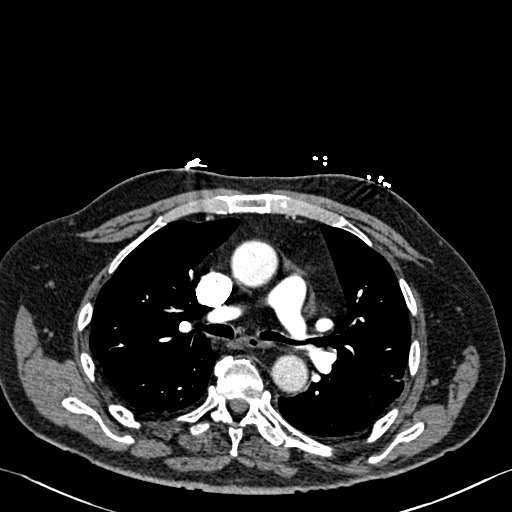
[im 153/255  lung]
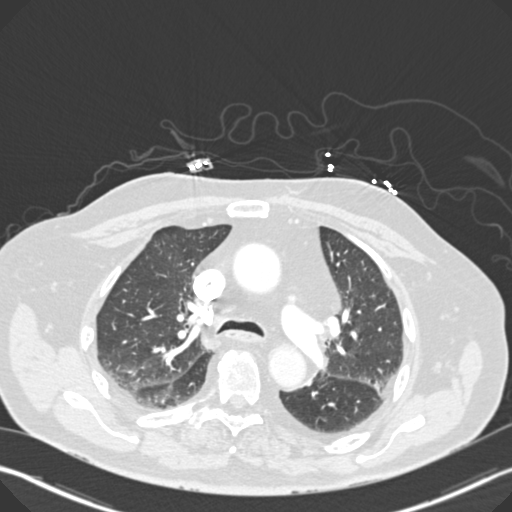
[im 166/255  mediastinal]
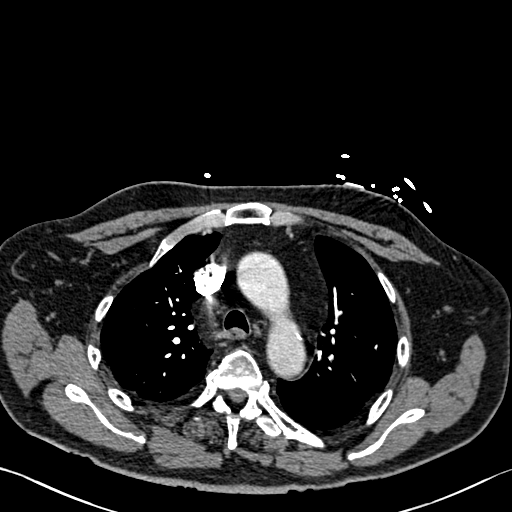
[im 178/255  lung]
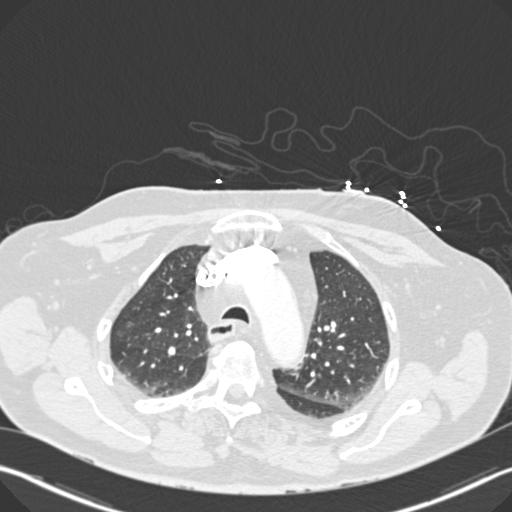
[im 191/255  mediastinal]
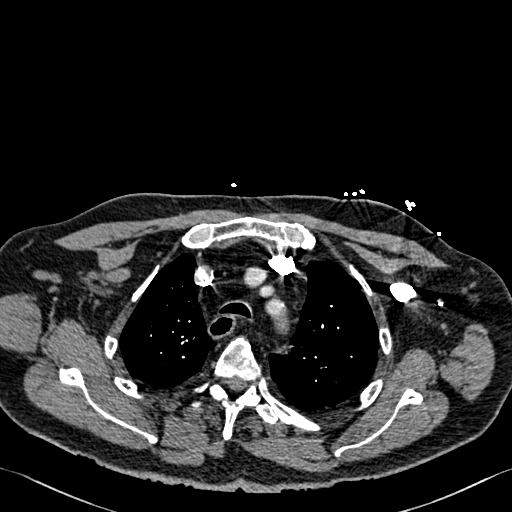
[im 204/255  lung]
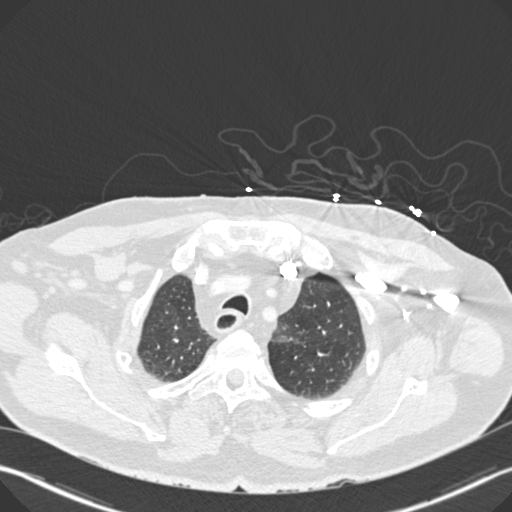
[im 216/255  mediastinal]
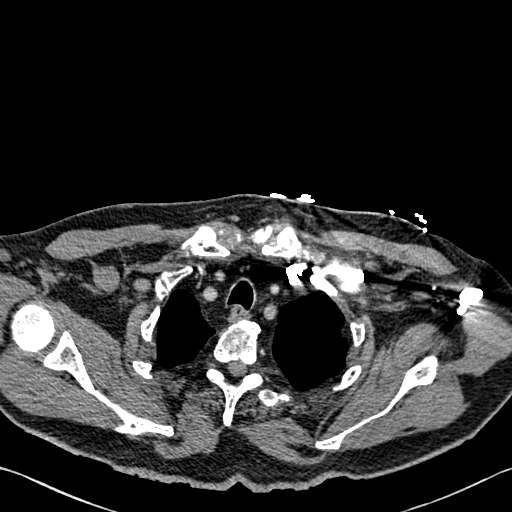
[im 229/255  lung]
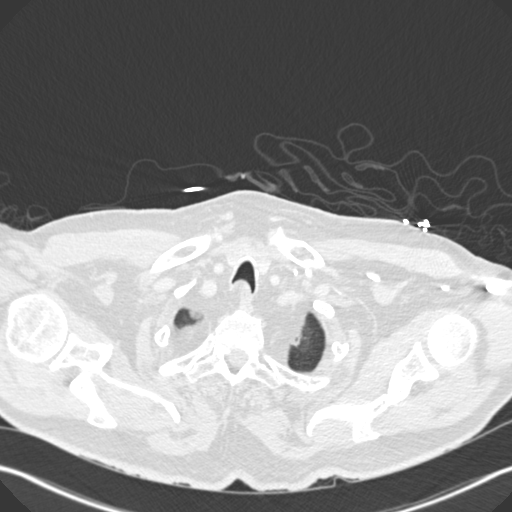
[im 242/255  mediastinal]
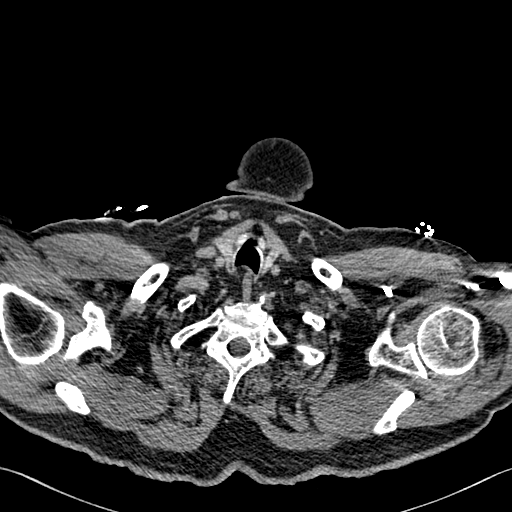

[Series 9: coronal mpr · coronal · 0.49mm/px · 1 of 121 slices shown]
[im 61/121  mediastinal]
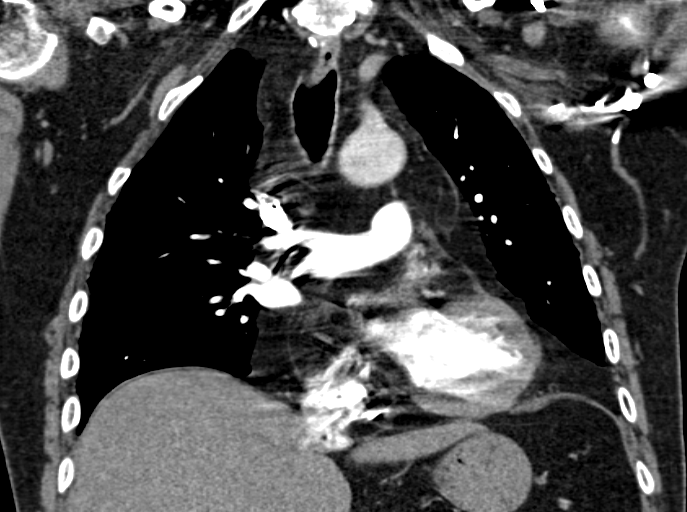

[19 of 36 positions shown; findings below may reference images not displayed]

FINDINGS: No evidence of pulmonary embolism.

Mediastinum/Nodes: Heart is top-normal in size. No pericardial
effusion.

Coronary atherosclerosis.

Mild atherosclerotic calcifications of the aortic arch.

Small mediastinal lymph nodes which do not meet pathologic CT size
criteria.

Visualized thyroid is unremarkable.

Lungs/Pleura: Patchy subpleural opacity in the lateral left lower
lobe (series 8/image 23), favored to reflect atelectasis.

Additional mild dependent atelectasis in the bilateral upper and
lower lobes.

No suspicious pulmonary nodules.

Prominent subpleural fat along the lateral left hemithorax (series
5/ image 45).

No pleural effusion or pneumothorax.

Upper abdomen: Visualized upper abdomen is unremarkable.

Musculoskeletal: Degenerative changes of the visualized
thoracolumbar spine.

Review of the MIP images confirms the above findings.
IMPRESSION: No evidence of pulmonary embolism.

Patchy subpleural opacity in the left lower lobe, favored to reflect
atelectasis.

No evidence of acute cardiopulmonary disease.

## 2015-12-12 ENCOUNTER — Ambulatory Visit (HOSPITAL_COMMUNITY)
Admission: EM | Admit: 2015-12-12 | Discharge: 2015-12-12 | Disposition: A | Discharge status: Home / Self Care | Payer: Medicare Other | Attending: Family Medicine | Admitting: Family Medicine

## 2015-12-12 ENCOUNTER — Encounter (HOSPITAL_COMMUNITY): Payer: Self-pay | Admitting: Family Medicine

## 2015-12-12 DIAGNOSIS — G20 Parkinson's disease: Secondary | ICD-10-CM | POA: Insufficient documentation

## 2015-12-12 DIAGNOSIS — K449 Diaphragmatic hernia without obstruction or gangrene: Secondary | ICD-10-CM | POA: Insufficient documentation

## 2015-12-12 DIAGNOSIS — I255 Ischemic cardiomyopathy: Secondary | ICD-10-CM | POA: Insufficient documentation

## 2015-12-12 DIAGNOSIS — K5090 Crohn's disease, unspecified, without complications: Secondary | ICD-10-CM | POA: Insufficient documentation

## 2015-12-12 DIAGNOSIS — K219 Gastro-esophageal reflux disease without esophagitis: Secondary | ICD-10-CM | POA: Insufficient documentation

## 2015-12-12 DIAGNOSIS — Z7982 Long term (current) use of aspirin: Secondary | ICD-10-CM | POA: Diagnosis not present

## 2015-12-12 DIAGNOSIS — I110 Hypertensive heart disease with heart failure: Secondary | ICD-10-CM | POA: Diagnosis not present

## 2015-12-12 DIAGNOSIS — I2510 Atherosclerotic heart disease of native coronary artery without angina pectoris: Secondary | ICD-10-CM | POA: Diagnosis not present

## 2015-12-12 DIAGNOSIS — R5383 Other fatigue: Secondary | ICD-10-CM | POA: Diagnosis present

## 2015-12-12 DIAGNOSIS — I5032 Chronic diastolic (congestive) heart failure: Secondary | ICD-10-CM | POA: Diagnosis not present

## 2015-12-12 LAB — CBC WITH DIFFERENTIAL/PLATELET
Basophils Absolute: 0 10*3/uL (ref 0.0–0.1)
Basophils Relative: 0 %
Eosinophils Absolute: 0.1 10*3/uL (ref 0.0–0.7)
Eosinophils Relative: 1 %
HCT: 40 % (ref 39.0–52.0)
Hemoglobin: 13.5 g/dL (ref 13.0–17.0)
Lymphocytes Relative: 41 %
Lymphs Abs: 3.5 10*3/uL (ref 0.7–4.0)
MCH: 31.8 pg (ref 26.0–34.0)
MCHC: 33.8 g/dL (ref 30.0–36.0)
MCV: 94.3 fL (ref 78.0–100.0)
Monocytes Absolute: 0.6 10*3/uL (ref 0.1–1.0)
Monocytes Relative: 7 %
Neutro Abs: 4.4 10*3/uL (ref 1.7–7.7)
Neutrophils Relative %: 51 %
Platelets: 228 10*3/uL (ref 150–400)
RBC: 4.24 MIL/uL (ref 4.22–5.81)
RDW: 13.4 % (ref 11.5–15.5)
WBC: 8.6 10*3/uL (ref 4.0–10.5)

## 2015-12-12 LAB — POCT URINALYSIS DIP (DEVICE)
Bilirubin Urine: NEGATIVE
Glucose, UA: NEGATIVE mg/dL
Hgb urine dipstick: NEGATIVE
Ketones, ur: NEGATIVE mg/dL
Leukocytes, UA: NEGATIVE
Nitrite: NEGATIVE
Protein, ur: NEGATIVE mg/dL
Specific Gravity, Urine: 1.01 (ref 1.005–1.030)
Urobilinogen, UA: 0.2 mg/dL (ref 0.0–1.0)
pH: 5 (ref 5.0–8.0)

## 2015-12-12 LAB — GLUCOSE, CAPILLARY: Glucose-Capillary: 137 mg/dL — ABNORMAL HIGH (ref 65–99)

## 2015-12-12 NOTE — ED Triage Notes (Signed)
Pt here for increased incontinence since Sunday and not feeling well. Per wife they have been traveling and he may be dehydrated.

## 2015-12-12 NOTE — ED Provider Notes (Signed)
[place]    CSN: [id number] Arrival date & time: 12/12/15  1843  First Provider Contact:  First MD Initiated Contact with Patient 12/12/15 1956        History   Chief Complaint Chief Complaint  Patient presents with  . Urinary Tract Infection    HPI [name] [name] is a 82 y.o. male.   Is an 82 year old patient of Dr. Virgina Jock who is brought in by his wife. He was feeling healthy until Sunday morning. He spent the evening on Saturday night out of town.  Beginning Sunday morning he felt very tired and has continued to feel exhausted since. He's been voiding a little bit more than usual. He's not had a fever or cough. He's not had shortness of breath or chest pain. He has not fallen or had any trauma. He's had no new swelling in his feet although he has had some mild distention in his abdomen.  Patient also denies abdominal pain.  He called his doctor's office this evening and they suggested he come in for evaluation and "lab work."  Patient's past medical history is significant for Crohn's disease, congestive heart failure, and Parkinson's disease.      Past Medical History:  Diagnosis Date  . Cellulitis   . Chronic diastolic CHF (congestive heart failure) (HCC)    a. EF initially 35-40% after MI 1/09; b. echo 7/08: EF 60%;  c. 11/2014 Echo: EF 65-70%, Gr 1 DD, mild MR.  . Coronary artery disease    a. s/p anterior STEMI 04/2007 with BMS-> LAD;  b. Cath 12/2011 patent stent;  c. low risk nuc 04/2014.  . Crohn's disease ([place])   . Diaphragmatic hernia without mention of obstruction or gangrene   . Diverticulosis of colon (without mention of hemorrhage)   . Fatty liver    a. on ultrasound of 10/2009  . Flatulence, eructation, and gas pain   . Fungal infection   . Gait disorder   . GERD (gastroesophageal reflux disease)   . HOH (hard of hearing)    Hearing aids  . Hyperlipidemia   . Hypertension   . Ischemic cardiomyopathy    a. EF initially 35-40% after MI  1/09; b. echo 7/08: EF 60%;  c. 11/2014 Echo: EF 65-70%, Gr 1 DD, mild MR.  . Melanoma ([place])   . Memory disorder   . Orthostatic hypotension   . Osteoporosis   . Other chronic nonalcoholic liver disease   . Other esophagitis   . Parkinson's disease ([place])   . RBBB 09/02/2013  . Regional enteritis of small intestine ([place])   . Renal calculi   . Restless leg syndrome 03/22/2015  . Ulcerative (chronic) ileocolitis ([place])   . Urinary incontinence     Patient Active Problem List   Diagnosis Date Noted  . Restless leg syndrome 03/22/2015  . Parkinson disease ([place]) 12/30/2014  . Alternaria infection ([place]) 12/29/2014  . Ischemic cardiomyopathy   . Coronary artery disease   . Chronic diastolic CHF (congestive heart failure) ([place])   . NASH (nonalcoholic steatohepatitis) 11/20/2014  . Peripheral edema   . Dizziness 11/11/2014  . Pressure ulcer 11/11/2014  . Orthostatic hypotension   . Dyspnea on exertion 04/22/2014  . Dehydration 09/28/2012  . Acute kidney injury ([place]) 09/28/2012  . Memory loss 08/19/2012  . Paralysis agitans ([place]) 08/19/2012  . Abnormality of gait 08/19/2012  . Dysphagia 05/24/2011  . Tinea corporis 01/04/2011  . Angina of effort ([place]) 12/14/2010  . Fatigue 12/14/2010  .  NOSEBLEED 05/23/2010  . Hyperlipidemia 04/29/2007  . Essential hypertension 04/29/2007  . GERD 04/29/2007  . Crohn's disease ([place]) 04/29/2007  . SMALL BOWEL OBSTRUCTION 04/29/2007  . DIVERTICULOSIS, COLON 04/29/2007  . FATTY LIVER DISEASE 08/30/2006  . HEMORRHOIDS 03/26/2005  . Erosive esophagitis 09/29/1997  . HIATAL HERNIA 09/29/1997    Past Surgical History:  Procedure Laterality Date  . APPENDECTOMY    . BACK SURGERY     L3, L4, L5  . CARDIAC CATHETERIZATION  09/25/06   EF 35-40% but more recently 60%  . CATARACT EXTRACTION     Bilateral  . CHOLECYSTECTOMY    . Coronary artery stent placement    . Melanoma resection    . Partial bowel resection    . TONSILLECTOMY          Home Medications    Prior to Admission medications   Medication Sig Start Date End Date Taking? Authorizing Provider  acetaminophen (TYLENOL) 325 MG tablet Take 650 mg by mouth every 6 (six) hours as needed for pain.    Historical Provider, MD  ADVAIR DISKUS 250-50 MCG/DOSE AEPB Inhale 1 puff into the lungs daily as needed. For shortness of breath 01/24/14   Historical Provider, MD  aspirin EC 81 MG tablet Take 81 mg by mouth daily.    Historical Provider, MD  atorvastatin (LIPITOR) 40 MG tablet Take 40 mg by mouth daily.    Historical Provider, MD  calcium carbonate (CALCIUM 600) 600 MG TABS tablet Take 600 mg by mouth daily with breakfast.    Historical Provider, MD  carbidopa-levodopa (SINEMET IR) 25-100 MG tablet TAKE 1+1/2 TABLET 4 TIMES DAILY 11/08/15   [name], MD  carvedilol (COREG) 3.125 MG tablet Take 3.125 mg by mouth daily.     Historical Provider, MD  Cholecalciferol (VITAMIN D-3) 1000 UNITS CAPS Take 1,000 Units by mouth daily.    Historical Provider, MD  cholestyramine Lucrezia Starch) 4 G packet TAKE 1 PACKET BY MOUTH TWICE DAILY WITH A MEAL 01/24/15   [name], MD  donepezil (ARICEPT) 10 MG tablet Take 1 tablet (10 mg total) by mouth at bedtime. 10/24/15   [name], MD  ipratropium (ATROVENT) 0.03 % nasal spray Place 2 sprays into both nostrils every 12 (twelve) hours.    Historical Provider, MD  loperamide (IMODIUM) 2 MG capsule Take 2 mg by mouth 2 (two) times daily.    Historical Provider, MD  midodrine (PROAMATINE) 5 MG tablet Take 1 tablet (5 mg total) by mouth 2 (two) times daily with a meal. 11/23/14   Dayna N Dunn, PA-C  Multiple Vitamins-Minerals (CENTRUM SILVER PO) Take 1 tablet by mouth daily.    Historical Provider, MD  nitroGLYCERIN (NITROSTAT) 0.4 MG SL tablet Place 0.4 mg under the tongue every 5 (five) minutes as needed for chest pain.    Historical Provider, MD  pantoprazole (PROTONIX) 40 MG tablet Take 40 mg by mouth daily.  12/30/14    Historical Provider, MD  potassium chloride SA (K-DUR,KLOR-CON) 20 MEQ tablet Take 40 mEq by mouth daily.    Historical Provider, MD  torsemide (DEMADEX) 20 MG tablet Take 1 tablet (20 mg total) by mouth daily. 11/23/14   Dayna N Dunn, PA-C  vitamin B-12 (CYANOCOBALAMIN) 1000 MCG tablet Take 1,000 mcg by mouth daily.    Historical Provider, MD    Family History Family History  Problem Relation Age of Onset  . Heart failure Mother   . Heart attack Mother   . Hypertension Mother   .  Emphysema Father   . Heart disease Brother   . Heart disease Brother   . Coronary artery disease    . Colon cancer Brother     Social History Social History  Substance Use Topics  . Smoking status: Never Smoker  . Smokeless tobacco: Never Used  . Alcohol use 1.2 - 1.8 oz/week    2 - 3 Standard drinks or equivalent per week     Comment: occ      Allergies   Review of patient's allergies indicates no known allergies.   Review of Systems Review of Systems  Constitutional: Positive for activity change, appetite change and fatigue. Negative for chills, diaphoresis, fever and unexpected weight change.  HENT: Negative.   Eyes: Negative.   Respiratory: Negative.   Cardiovascular: Negative.   Gastrointestinal: Negative.   Genitourinary: Positive for frequency. Negative for decreased urine volume, difficulty urinating, dysuria, flank pain, hematuria and urgency.  Musculoskeletal: Negative.      Physical Exam Triage Vital Signs ED Triage Vitals [12/12/15 1953]  Enc Vitals Group     BP 127/66     Pulse Rate 96     Resp 18     Temp 98.1 F (36.7 C)     Temp src      SpO2 98 %     Weight      Height      Head Circumference      Peak Flow      Pain Score      Pain Loc      Pain Edu?      Excl. in [place]?    No data found.   Updated Vital Signs BP 127/66   Pulse 96   Temp 98.1 F (36.7 C)   Resp 18   SpO2 98%   Visual Acuity Right Eye Distance:   Left Eye Distance:   Bilateral  Distance:    Right Eye Near:   Left Eye Near:    Bilateral Near:     Physical Exam  Constitutional: He is oriented to person, place, and time. He appears well-developed and well-nourished.  HENT:  Head: Normocephalic and atraumatic.  Mouth/Throat: Oropharynx is clear and moist.  Eyes: Conjunctivae and EOM are normal. Pupils are equal, round, and reactive to light.  Neck: Normal range of motion. Neck supple.  Cardiovascular: Normal rate, regular rhythm, normal heart sounds and intact distal pulses.   Pulmonary/Chest: Effort normal and breath sounds normal.  Abdominal: Soft. Bowel sounds are normal.  Musculoskeletal:  Patient uses a walker but is stable going to an from the bathroom using the appliance.  Lymphadenopathy:    He has no cervical adenopathy.  Neurological: He is alert and oriented to person, place, and time.  Skin: Skin is warm and dry.  Nursing note and vitals reviewed.    UC Treatments / Results  Labs (all labs ordered are listed, but only abnormal results are displayed) Labs Reviewed  CBC WITH DIFFERENTIAL/PLATELET  POCT URINALYSIS DIP (DEVICE)    EKG  EKG Interpretation None       Radiology No results found.  Procedures Procedures (including critical care time)  Medications Ordered in UC Medications - No data to display   Initial Impression / Assessment and Plan / UC Course  I have reviewed the triage vital signs and the nursing notes.  Pertinent labs & imaging results that were available during my care of the patient were reviewed by me and considered in my medical decision making (see chart  for details).  Clinical Course      Final Clinical Impressions(s) / UC Diagnoses   Final diagnoses:  Exhaustion    New Prescriptions New Prescriptions   No medications on file     Robyn Haber, MD 12/12/15 2036

## 2015-12-12 NOTE — Discharge Instructions (Signed)
There is no sign of a urinary infection. Other labs should be ready in the morning. Please call Dr. Virgina Jock in the morning to find out what he recommends next. At this point we don't see any acute infection, acute heart failure, or other deterioration needing treatment tonight.

## 2016-01-18 ENCOUNTER — Other Ambulatory Visit: Payer: Self-pay

## 2016-01-18 ENCOUNTER — Telehealth: Payer: Self-pay | Admitting: Gastroenterology

## 2016-01-18 MED ORDER — CHOLESTYRAMINE 4 G PO PACK: PACK | ORAL | 11 refills | Status: DC

## 2016-01-18 NOTE — Telephone Encounter (Signed)
Refill sent to pharmacy.   

## 2016-01-29 ENCOUNTER — Encounter: Payer: Self-pay | Admitting: Neurology

## 2016-01-29 ENCOUNTER — Ambulatory Visit (INDEPENDENT_AMBULATORY_CARE_PROVIDER_SITE_OTHER): Payer: Medicare Other | Admitting: Neurology

## 2016-01-29 VITALS — BP 122/64 | HR 64 | Ht 67.5 in | Wt 170.2 lb

## 2016-01-29 DIAGNOSIS — G20 Parkinson's disease: Secondary | ICD-10-CM

## 2016-01-29 DIAGNOSIS — R413 Other amnesia: Secondary | ICD-10-CM

## 2016-01-29 DIAGNOSIS — R269 Unspecified abnormalities of gait and mobility: Secondary | ICD-10-CM

## 2016-01-29 NOTE — Progress Notes (Signed)
Reason for visit: Parkinson's disease  [name] [name] is an 82 y.o. male  History of present illness:  [name] [name] is an 82 year old right-handed white male with a history of Parkinson's disease associated with a gait disorder and a memory disorder. The patient uses a walker to get around, he has not had any falls since last seen. He continues to have a slow decline in his memory, he is less likely to be involved in conversations. He is trying to stay active, he works with a trainer once a week, he takes water aerobics, and he is considering boxing therapy. He denies any freezing with walking. The patient is sleeping well, but he remains somewhat fatigued during the day, he may nap at times. He also plays golf. The patient does not operate a motor vehicle at this time, the wife indicates that he is tolerating Aricept but his memory is still changing.   Past Medical History:  Diagnosis Date  . Cellulitis   . Chronic diastolic CHF (congestive heart failure) (HCC)    a. EF initially 35-40% after MI 1/09; b. echo 7/08: EF 60%;  c. 11/2014 Echo: EF 65-70%, Gr 1 DD, mild MR.  . Coronary artery disease    a. s/p anterior STEMI 04/2007 with BMS-> LAD;  b. Cath 12/2011 patent stent;  c. low risk nuc 04/2014.  . Crohn's disease ([place])   . Diaphragmatic hernia without mention of obstruction or gangrene   . Diverticulosis of colon (without mention of hemorrhage)   . Fatty liver    a. on ultrasound of 10/2009  . Flatulence, eructation, and gas pain   . Fungal infection   . Gait disorder   . GERD (gastroesophageal reflux disease)   . HOH (hard of hearing)    Hearing aids  . Hyperlipidemia   . Hypertension   . Ischemic cardiomyopathy    a. EF initially 35-40% after MI 1/09; b. echo 7/08: EF 60%;  c. 11/2014 Echo: EF 65-70%, Gr 1 DD, mild MR.  . Melanoma ([place])   . Memory disorder   . Orthostatic hypotension   . Osteoporosis   . Other chronic nonalcoholic liver disease   . Other esophagitis   .  Parkinson's disease ([place])   . RBBB 09/02/2013  . Regional enteritis of small intestine ([place])   . Renal calculi   . Restless leg syndrome 03/22/2015  . 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 Urinary incontinence     Past Surgical History:  Procedure Laterality Date  . APPENDECTOMY    . BACK SURGERY     L3, L4, L5  . CARDIAC CATHETERIZATION  09/25/06   EF 35-40% but more recently 60%  . CATARACT EXTRACTION     Bilateral  . CHOLECYSTECTOMY    . Coronary artery stent placement    . Melanoma resection    . Partial bowel resection    . TONSILLECTOMY      Family History  Problem Relation Age of Onset  . Heart failure Mother   . Heart attack Mother   . Hypertension Mother   . Emphysema Father   . Heart disease Brother   . Heart disease Brother   . Coronary artery disease    . Colon cancer Brother     Social history:  reports that he has never smoked. He has never used smokeless tobacco. He reports that he drinks about 1.2 - 1.8 oz of alcohol per week . He reports that he does not use drugs.  No Known Allergies  Medications:  Prior to Admission medications   Medication Sig Start Date End Date Taking? Authorizing Provider  acetaminophen (TYLENOL) 325 MG tablet Take 650 mg by mouth every 6 (six) hours as needed for pain.   Yes Historical Provider, MD  ADVAIR DISKUS 250-50 MCG/DOSE AEPB Inhale 1 puff into the lungs daily as needed. For shortness of breath 01/24/14  Yes Historical Provider, MD  aspirin EC 81 MG tablet Take 81 mg by mouth daily.   Yes Historical Provider, MD  atorvastatin (LIPITOR) 40 MG tablet Take 40 mg by mouth daily.   Yes Historical Provider, MD  calcium carbonate (CALCIUM 600) 600 MG TABS tablet Take 600 mg by mouth daily with breakfast.   Yes Historical Provider, MD  carbidopa-levodopa (SINEMET IR) 25-100 MG tablet TAKE 1+1/2 TABLET 4 TIMES DAILY 11/08/15  Yes [name], MD  carvedilol (COREG) 3.125 MG tablet Take 3.125 mg by mouth daily.    Yes  Historical Provider, MD  Cholecalciferol (VITAMIN D-3) 1000 UNITS CAPS Take 1,000 Units by mouth daily.   Yes Historical Provider, MD  cholestyramine (QUESTRAN) 4 g packet TAKE 1 PACKET BY MOUTH TWICE DAILY WITH A MEAL 01/18/16  Yes [name], MD  donepezil (ARICEPT) 10 MG tablet Take 1 tablet (10 mg total) by mouth at bedtime. 10/24/15  Yes [name], MD  ipratropium (ATROVENT) 0.03 % nasal spray Place 2 sprays into both nostrils every 12 (twelve) hours.   Yes Historical Provider, MD  loperamide (IMODIUM) 2 MG capsule Take 2 mg by mouth 2 (two) times daily.   Yes Historical Provider, MD  midodrine (PROAMATINE) 5 MG tablet Take 1 tablet (5 mg total) by mouth 2 (two) times daily with a meal. 11/23/14  Yes Dayna N Dunn, PA-C  Multiple Vitamins-Minerals (CENTRUM SILVER PO) Take 1 tablet by mouth daily.   Yes Historical Provider, MD  nitroGLYCERIN (NITROSTAT) 0.4 MG SL tablet Place 0.4 mg under the tongue every 5 (five) minutes as needed for chest pain.   Yes Historical Provider, MD  pantoprazole (PROTONIX) 40 MG tablet Take 40 mg by mouth daily.  12/30/14  Yes Historical Provider, MD  potassium chloride SA (K-DUR,KLOR-CON) 20 MEQ tablet Take 40 mEq by mouth daily.   Yes Historical Provider, MD  torsemide (DEMADEX) 20 MG tablet Take 1 tablet (20 mg total) by mouth daily. 11/23/14  Yes Dayna N Dunn, PA-C  vitamin B-12 (CYANOCOBALAMIN) 1000 MCG tablet Take 1,000 mcg by mouth daily.   Yes Historical Provider, MD    ROS:  Out of a complete 14 system review of symptoms, the patient complains only of the following symptoms, and all other reviewed systems are negative.  Fatigue Runny nose Cough, wheezing, shortness of breath Excessive thirst Acting out dreams Incontinence of the bladder, frequency of urination Walking difficulty, neck stiffness Bruising easily Memory loss, numbness Agitation, anxiety  Blood pressure 122/64, pulse 64, height 5' 7.5" (1.715 m), weight 170 lb 4 oz (77.2  kg).  Physical Exam  General: The patient is alert and cooperative at the time of the examination. the patient is moderately obese.  Skin: No significant peripheral edema is noted.   Neurologic Exam  Mental status: The patient is alert and oriented x 3 at the time of the examination. The patient has apparent normal recent and remote memory, with an apparently normal attention span and concentration ability. Mini-Mental Status Examination done today shows a total score of 27/30.   Cranial nerves: Facial symmetry is  present. Speech is normal, no aphasia or dysarthria is noted. Extraocular movements are full. Visual fields are full. mask interface is seen. Masking of the face is seen  Motor: The patient has good strength in all 4 extremities.  Sensory examination: Soft touch sensation is symmetric on the face, arms, and legs.  Coordination: The patient has good finger-nose-finger and heel-to-shin bilaterally.  Gait and station: The patient has a shuffling type gait, tandem gait was not attempted. Gait is somewhat stooped, decreased arm swing. Romberg is negative. No drift is seen.  Reflexes: Deep tendon reflexes are symmetric.   Assessment/Plan:   1. Parkinson's disease  2. Gait disorder  3. Memory disorder  The patient will continue the current medications for Parkinson's disease. I have continued to encourage a high level of physical activity, the patient is doing well with this. We will continue the Aricept, he will follow-up in about 5 months.  Jill Alexanders MD 01/29/2016 10:50 AM  [facility] [address] [place], [place] [address]  Phone [phone] Fax [phone]

## 2016-01-29 NOTE — Patient Instructions (Addendum)
Fall Prevention in the Home  Falls can cause injuries and can affect people from all age groups. There are many simple things that you can do to make your home safe and to help prevent falls. WHAT CAN I DO ON THE OUTSIDE OF MY HOME?  Regularly repair the edges of walkways and driveways and fix any cracks.  Remove high doorway thresholds.  Trim any shrubbery on the main path into your home.  Use bright outdoor lighting.  Clear walkways of debris and clutter, including tools and rocks.  Regularly check that handrails are securely fastened and in good repair. Both sides of any steps should have handrails.  Install guardrails along the edges of any raised decks or porches.  Have leaves, snow, and ice cleared regularly.  Use sand or salt on walkways during winter months.  In the garage, clean up any spills right away, including grease or oil spills. WHAT CAN I DO IN THE BATHROOM?  Use night lights.  Install grab bars by the toilet and in the tub and shower. Do not use towel bars as grab bars.  Use non-skid mats or decals on the floor of the tub or shower.  If you need to sit down while you are in the shower, use a plastic, non-slip stool..  Keep the floor dry. Immediately clean up any water that spills on the floor.  Remove soap buildup in the tub or shower on a regular basis.  Attach bath mats securely with double-sided non-slip rug tape.  Remove throw rugs and other tripping hazards from the floor. WHAT CAN I DO IN THE BEDROOM?  Use night lights.  Make sure that a bedside light is easy to reach.  Do not use oversized bedding that drapes onto the floor.  Have a firm chair that has side arms to use for getting dressed.  Remove throw rugs and other tripping hazards from the floor. WHAT CAN I DO IN THE KITCHEN?   Clean up any spills right away.  Avoid walking on wet floors.  Place frequently used items in easy-to-reach places.  If you need to reach for something  above you, use a sturdy step stool that has a grab bar.  Keep electrical cables out of the way.  Do not use floor polish or wax that makes floors slippery. If you have to use wax, make sure that it is non-skid floor wax.  Remove throw rugs and other tripping hazards from the floor. WHAT CAN I DO IN THE STAIRWAYS?  Do not leave any items on the stairs.  Make sure that there are handrails on both sides of the stairs. Fix handrails that are broken or loose. Make sure that handrails are as long as the stairways.  Check any carpeting to make sure that it is firmly attached to the stairs. Fix any carpet that is loose or worn.  Avoid having throw rugs at the top or bottom of stairways, or secure the rugs with carpet tape to prevent them from moving.  Make sure that you have a light switch at the top of the stairs and the bottom of the stairs. If you do not have them, have them installed. WHAT ARE SOME OTHER FALL PREVENTION TIPS?  Wear closed-toe shoes that fit well and support your feet. Wear shoes that have rubber soles or low heels.  When you use a stepladder, make sure that it is completely opened and that the sides are firmly locked. Have someone hold the ladder while you   are using it. Do not climb a closed stepladder.  Add color or contrast paint or tape to grab bars and handrails in your home. Place contrasting color strips on the first and last steps.  Use mobility aids as needed, such as canes, walkers, scooters, and crutches.  Turn on lights if it is dark. Replace any light bulbs that burn out.  Set up furniture so that there are clear paths. Keep the furniture in the same spot.  Fix any uneven floor surfaces.  Choose a carpet design that does not hide the edge of steps of a stairway.  Be aware of any and all pets.  Review your medicines with your healthcare provider. Some medicines can cause dizziness or changes in blood pressure, which increase your risk of falling. Talk  with your health care provider about other ways that you can decrease your risk of falls. This may include working with a physical therapist or trainer to improve your strength, balance, and endurance.   This information is not intended to replace advice given to you by your health care provider. Make sure you discuss any questions you have with your health care provider.   Document Released: 03/15/2002 Document Revised: 08/09/2014 Document Reviewed: 04/29/2014 Elsevier Interactive Patient Education 2016 Elsevier Inc.  

## 2016-02-05 ENCOUNTER — Encounter: Payer: Self-pay | Admitting: Gastroenterology

## 2016-02-05 ENCOUNTER — Ambulatory Visit (INDEPENDENT_AMBULATORY_CARE_PROVIDER_SITE_OTHER): Payer: Medicare Other | Admitting: Gastroenterology

## 2016-02-05 VITALS — BP 106/77 | HR 88 | Ht 67.72 in | Wt 169.5 lb

## 2016-02-05 DIAGNOSIS — K5080 Crohn's disease of both small and large intestine without complications: Secondary | ICD-10-CM | POA: Diagnosis not present

## 2016-02-05 NOTE — Patient Instructions (Addendum)
Stay on questran twice daily. Please return to see Dr. Ardis Hughs in 6 months. Sooner if needed.  Please call in 2 months to set up appointment.  [phone]

## 2016-02-05 NOTE — Progress Notes (Signed)
GI PROBLEM LIST:  1. Crohn's disease. Distant ileal and right colon resection by Dr. Gretta Cool in the very distant past. He was maintained on Pentasa, Entocort, and 32m of Purinethol for several years under the care of Dr. SLyla Son Hospitalization, May 2008, for acute myocardial infarction complicated by small bowel obstruction likely due to active Crohn's. Increased Purinethol to 100 mg daily and liver tests increased as well. Purinethol held and liver test normalized. The patient felt much better overall as well (less diarrhea, less fatigue). Winter, 2009: currently on 6 pills of Pentasa day feeling quite well overall. Summer, 2010 postoperative ileus following spine surgery (doubt active Crohn's flare). November, 2011: started cholestyramine with very good results (loose stools MUCH improved, only going 3-4 times a day). 03/2014 rov with increasing loose stools, urgency; started on uceris, levsin. Repeat colonoscopy 4 2016 showed anastomotic ulceration, narrowing of the ileocecal anastomosis. Biopsies showed chronic and active inflammation. He was started on Remicade 5 mg/kg after TB testing and hepatitis testing were proven to be negative with very good response to the remicade. June 2016, holding Remicade due to fungal cellulitis. 01/2015 continuing to hold remicade, fungus remains a problem. 09/2015 doing well on questran twice daily. 2. Dysphagia February 2012: Barium esophagram showed mild narrowing at his GE junction. Was planning to perform dilation off Plavix (if okay with his cardiologist) when he had a bowel obstruction.  3. partial small bowel obstruction, February 2012. CT suggested transition point in the pelvis. Not clear if there was active Crohn's disease contributing to this, however he was put on prednisone in hosp; started 319ma day, taper off quickly. Recurrent obstruction 09/2012: CT scan IMPRESSION: Small bowel obstruction with apparent change in caliber within the anterior  right lower quadrant - right upper pelvis most likely due to an adhesion. Was admitted to hosp, put on steroids empirically however seems more likely to have been adhesive related than due to active IBD.   HPI: This is a   very pleasant 82 year old man whom I last saw 4 months ago  He is here with his wife today  Chief complaint is Crohn's disease  He is walking with a walker due to intermittent falls. I reviewed note from his neurologist recently and seems that his memory is getting a bit worse due to Parkinson's disease.  Doing well from GI perpective; solid BM 1-2 times a day.  Taking questran twice daily.  Biggest issue is urinary incontinence  Weight stable vs 4 months ago (same scale here in GI office)  ROS: complete GI ROS as described in HPI.  Constitutional:  No unintentional weight loss   Past Medical History:  Diagnosis Date  . Cellulitis   . Chronic diastolic CHF (congestive heart failure) (HCC)    a. EF initially 35-40% after MI 1/09; b. echo 7/08: EF 60%;  c. 11/2014 Echo: EF 65-70%, Gr 1 DD, mild MR.  . Coronary artery disease    a. s/p anterior STEMI 04/2007 with BMS-> LAD;  b. Cath 12/2011 patent stent;  c. low risk nuc 04/2014.  . [place]  . Diaphragmatic hernia without mention of obstruction or gangrene   . Diverticulosis of colon (without mention of hemorrhage)   . Fatty liver    a. on ultrasound of 10/2009  . Flatulence, eructation, and gas pain   . Fungal infection   . Gait disorder   . GERD (gastroesophageal reflux disease)   . HOH (hard of hearing)    Hearing  aids  . Hyperlipidemia   . Hypertension   . Ischemic cardiomyopathy    a. EF initially 35-40% after MI 1/09; b. echo 7/08: EF 60%;  c. 11/2014 Echo: EF 65-70%, Gr 1 DD, mild MR.  . Melanoma ([place])   . Memory disorder   . Orthostatic hypotension   . Osteoporosis   . Other chronic nonalcoholic liver disease   . Other esophagitis   . Parkinson's disease ([place])   . RBBB 09/02/2013  .  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 Renal calculi   . Restless leg syndrome 03/22/2015  . 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 Urinary incontinence     Past Surgical History:  Procedure Laterality Date  . APPENDECTOMY    . BACK SURGERY     L3, L4, L5  . CARDIAC CATHETERIZATION  09/25/06   EF 35-40% but more recently 60%  . CATARACT EXTRACTION     Bilateral  . CHOLECYSTECTOMY    . Coronary artery stent placement    . Melanoma resection    . Partial bowel resection    . TONSILLECTOMY      Current Outpatient Prescriptions  Medication Sig Dispense Refill  . acetaminophen (TYLENOL) 325 MG tablet Take 650 mg by mouth every 6 (six) hours as needed for pain.    Marland Kitchen ADVAIR DISKUS 250-50 MCG/DOSE AEPB Inhale 1 puff into the lungs daily as needed. For shortness of breath    . aspirin EC 81 MG tablet Take 81 mg by mouth daily.    Marland Kitchen atorvastatin (LIPITOR) 40 MG tablet Take 40 mg by mouth daily.    . calcium carbonate (CALCIUM 600) 600 MG TABS tablet Take 600 mg by mouth daily with breakfast.    . carbidopa-levodopa (SINEMET IR) 25-100 MG tablet TAKE 1+1/2 TABLET 4 TIMES DAILY 540 tablet 3  . carvedilol (COREG) 3.125 MG tablet Take 3.125 mg by mouth daily.     . Cholecalciferol (VITAMIN D-3) 1000 UNITS CAPS Take 1,000 Units by mouth daily.    . cholestyramine (QUESTRAN) 4 g packet TAKE 1 PACKET BY MOUTH TWICE DAILY WITH A MEAL 60 each 11  . donepezil (ARICEPT) 10 MG tablet Take 1 tablet (10 mg total) by mouth at bedtime. 90 tablet 3  . ipratropium (ATROVENT) 0.03 % nasal spray Place 2 sprays into both nostrils every 12 (twelve) hours.    Marland Kitchen loperamide (IMODIUM) 2 MG capsule Take 2 mg by mouth 2 (two) times daily.    . midodrine (PROAMATINE) 5 MG tablet Take 1 tablet (5 mg total) by mouth 2 (two) times daily with a meal. 60 tablet 9  . Multiple Vitamins-Minerals (CENTRUM SILVER PO) Take 1 tablet by mouth daily.    . nitroGLYCERIN (NITROSTAT) 0.4 MG SL tablet Place 0.4 mg under  the tongue every 5 (five) minutes as needed for chest pain.    . pantoprazole (PROTONIX) 40 MG tablet Take 40 mg by mouth daily.     . potassium chloride SA (K-DUR,KLOR-CON) 20 MEQ tablet Take 40 mEq by mouth daily.    Marland Kitchen torsemide (DEMADEX) 20 MG tablet Take 1 tablet (20 mg total) by mouth daily. 180 tablet 3  . vitamin B-12 (CYANOCOBALAMIN) 1000 MCG tablet Take 1,000 mcg by mouth daily.     No current facility-administered medications for this visit.     Allergies as of 02/05/2016  . (No Known Allergies)    Family History  Problem Relation Age of Onset  . Heart failure Mother   .  Heart attack Mother   . Hypertension Mother   . Emphysema Father   . Heart disease Brother   . Heart disease Brother   . Coronary artery disease    . Colon cancer Brother   . Stomach cancer Neg Hx     Social History   Social History  . Marital status: Married    Spouse name: N/A  . Number of children: 3  . Years of education: college   Occupational History  . CEO-retired [employer]  . CEO [employer]   Social History Main Topics  . Smoking status: Never Smoker  . Smokeless tobacco: Never Used  . Alcohol use 1.2 - 1.8 oz/week    2 - 3 Standard drinks or equivalent per week     Comment: occ   . Drug use: No  . Sexual activity: Not on file   Other Topics Concern  . Not on file   Social History Narrative   Lives w/ his wife at [place]   Patient is right handed.   Patient drinks very little caffeine.     Physical Exam: Ht 5' 7.72" (1.72 m) Comment: w/o shoes  Wt 169 lb 8 oz (76.9 kg)   BMI 25.99 kg/m  Constitutional: generally well-appearing Psychiatric: alert and oriented x3 Abdomen: soft, nontender, nondistended, no obvious ascites, no peritoneal signs, normal bowel sounds No peripheral edema noted in lower extremities  Assessment and plan: 82 y.o. male with Crohn's disease  His GI symptoms are under very good control on Questran one  dose twice daily. His biggest issue is urologic, urinary incontinence. His other big issue of memory difficulties seem to be increasing. I see no reason for any further GI testing at this point. He will return to see me in 6 months and sooner if needed. His wife says she has had difficulties getting through to the clinical staff at times when she has called and so I have instructed her to ask for [facility] [place] if she has difficulties like this in the future   Owens Loffler, MD [facility] 02/05/2016, 8:59 AM

## 2016-03-05 ENCOUNTER — Other Ambulatory Visit (HOSPITAL_COMMUNITY): Payer: Self-pay | Admitting: *Deleted

## 2016-03-06 ENCOUNTER — Ambulatory Visit (HOSPITAL_COMMUNITY): Payer: Medicare Other | Attending: Internal Medicine

## 2016-03-22 NOTE — Progress Notes (Signed)
Cardiology Office Note Date:  03/25/2016  Patient ID:  [name] [name], [name] [name] 11-19-1928, MRN [id number] PCP:  [name], MD  Cardiologist:  Dr. [name]   Chief Complaint: f/u CHF  History of Present Illness: [name] [name] is a 82 y.o. male with history of CAD (s/p anterior STEMI 04/2007 with BMS-> LAD, patent 12/2011, low risk nuc 04/2014), chronic dCHF, HTN, HLD, Crohn's disease, GERD, Parkinson's disease, cellulitis, orthostatic hypotension requiring midodrine, GERD, RBBB, fatty liver, gait disorder, chronic lower extremity edema who presents for follow up.   He is seen with his wife today. He feels well. He has some mild SOB. No chest pain. He is having mild orthostatic symptoms when he first stands. If BP is <110 he holds his Coreg. He just stops and waits for lightheadedness to resolve. He has no edema.   Past Medical History:  Diagnosis Date  . Cellulitis   . Chronic diastolic CHF (congestive heart failure) (HCC)    a. EF initially 35-40% after MI 1/09; b. echo 7/08: EF 60%;  c. 11/2014 Echo: EF 65-70%, Gr 1 DD, mild MR.  . Coronary artery disease    a. s/p anterior STEMI 04/2007 with BMS-> LAD;  b. Cath 12/2011 patent stent;  c. low risk nuc 04/2014.  . Crohn's disease ([place])   . Diaphragmatic hernia without mention of obstruction or gangrene   . Diverticulosis of colon (without mention of hemorrhage)   . Fatty liver    a. on ultrasound of 10/2009  . Flatulence, eructation, and gas pain   . Fungal infection   . Gait disorder   . GERD (gastroesophageal reflux disease)   . HOH (hard of hearing)    Hearing aids  . Hyperlipidemia   . Hypertension   . Ischemic cardiomyopathy    a. EF initially 35-40% after MI 1/09; b. echo 7/08: EF 60%;  c. 11/2014 Echo: EF 65-70%, Gr 1 DD, mild MR.  . Melanoma ([place])   . Memory disorder   . Orthostatic hypotension   . Osteoporosis   . Other chronic nonalcoholic liver disease   . Other esophagitis   . Parkinson's disease ([place])   . RBBB 09/02/2013   . Regional enteritis of small intestine ([place])   . Renal calculi   . Restless leg syndrome 03/22/2015  . 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 Urinary incontinence     Past Surgical History:  Procedure Laterality Date  . APPENDECTOMY    . BACK SURGERY     L3, L4, L5  . CARDIAC CATHETERIZATION  09/25/06   EF 35-40% but more recently 60%  . CATARACT EXTRACTION     Bilateral  . CHOLECYSTECTOMY    . Coronary artery stent placement    . Melanoma resection    . Partial bowel resection    . TONSILLECTOMY      Current Outpatient Prescriptions  Medication Sig Dispense Refill  . acetaminophen (TYLENOL) 325 MG tablet Take 650 mg by mouth every 6 (six) hours as needed for pain.    Marland Kitchen ADVAIR DISKUS 250-50 MCG/DOSE AEPB Inhale 1 puff into the lungs daily as needed. For shortness of breath    . aspirin EC 81 MG tablet Take 81 mg by mouth daily.    Marland Kitchen atorvastatin (LIPITOR) 40 MG tablet Take 40 mg by mouth daily.    . calcium carbonate (CALCIUM 600) 600 MG TABS tablet Take 600 mg by mouth daily with breakfast.    . carbidopa-levodopa (SINEMET IR) 25-100 MG tablet  TAKE 1+1/2 TABLET 4 TIMES DAILY 540 tablet 3  . carvedilol (COREG) 3.125 MG tablet Take 3.125 mg by mouth daily.     . Cholecalciferol (VITAMIN D-3) 1000 UNITS CAPS Take 1,000 Units by mouth daily.    . cholestyramine (QUESTRAN) 4 g packet TAKE 1 PACKET BY MOUTH TWICE DAILY WITH A MEAL 60 each 11  . donepezil (ARICEPT) 10 MG tablet Take 1 tablet (10 mg total) by mouth at bedtime. 90 tablet 3  . ipratropium (ATROVENT) 0.03 % nasal spray Place 2 sprays into both nostrils every 12 (twelve) hours.    Marland Kitchen loperamide (IMODIUM) 2 MG capsule Take 2 mg by mouth 2 (two) times daily.    . midodrine (PROAMATINE) 5 MG tablet Take 1 tablet (5 mg total) by mouth 2 (two) times daily with a meal. 60 tablet 9  . Multiple Vitamins-Minerals (CENTRUM SILVER PO) Take 1 tablet by mouth daily.    . nitroGLYCERIN (NITROSTAT) 0.4 MG SL tablet Place 0.4 mg  under the tongue every 5 (five) minutes as needed for chest pain.    . pantoprazole (PROTONIX) 40 MG tablet Take 40 mg by mouth daily.     . potassium chloride SA (K-DUR,KLOR-CON) 20 MEQ tablet Take 40 mEq by mouth daily.    Marland Kitchen torsemide (DEMADEX) 20 MG tablet Take 1 tablet (20 mg total) by mouth daily. 180 tablet 3  . vitamin B-12 (CYANOCOBALAMIN) 1000 MCG tablet Take 1,000 mcg by mouth daily.     No current facility-administered medications for this visit.     Allergies:   Patient has no known allergies.   Social History:  The patient  reports that he has never smoked. He has never used smokeless tobacco. He reports that he drinks about 1.2 - 1.8 oz of alcohol per week . He reports that he does not use drugs.   Family History:  The patient's family history includes Colon cancer in his brother; Emphysema in his father; Heart attack in his mother; Heart disease in his brother and brother; Heart failure in his mother; Hypertension in his mother.  ROS:  Please see the history of present illness.   All other systems are reviewed and otherwise negative.   PHYSICAL EXAM:  VS:  BP 110/70   Pulse 86   Ht 5' 9"  (1.753 m)   Wt 171 lb (77.6 kg)   BMI 25.25 kg/m  BMI: Body mass index is 25.25 kg/m. O2 sat 93% Well nourished, well developed WM in no acute distress  HEENT: normocephalic, atraumatic  Neck: no JVD, carotid bruits or masses Cardiac:  normal S1, S2; reg rhythm with mildly elevated rate; no murmurs, rubs, or gallops Lungs:  Clear, no wheezing, rhonchi or rales  Abd: rounded, distended, +BS, nontender MS: no deformity or atrophy Ext: No LE edema  Skin: warm and dry, no rash Neuro:  moves all extremities spontaneously, no focal abnormalities noted, follows commands Psych: euthymic mood, somewhat flat affect   EKG:  Not is not done today.    Recent Labs: 04/05/2015: BUN 18; Creat 0.94; Potassium 3.7; Sodium 140 12/12/2015: Hemoglobin 13.5; Platelets 228  No results found for  requested labs within last 8760 hours.   CrCl cannot be calculated (Patient's most recent lab result is older than the maximum 21 days allowed.).   Labs dated 08/25/15: cholesterol 116, triglycerides 105, LDL 49, HDL 46. LFTs and TSH normal Dated 01/02/16: A1c 6.1% Dated 02/21/16: BUN 27, creatinine 1.2  Wt Readings from Last 3 Encounters:  03/25/16  171 lb (77.6 kg)  02/05/16 169 lb 8 oz (76.9 kg)  01/29/16 170 lb 4 oz (77.2 kg)     Other studies reviewed: Additional studies/records reviewed today include:  Echo 11/14/15: Study Conclusions  - Left ventricle: The cavity size was normal. There was mild focal   basal and mild concentric hypertrophy of the septum. Systolic   function was vigorous. The estimated ejection fraction was in the   range of 65% to 70%. Wall motion was normal; there were no   regional wall motion abnormalities. Doppler parameters are   consistent with abnormal left ventricular relaxation (grade 1   diastolic dysfunction). - Mitral valve: There was mild regurgitation.  Impressions:  - Compared to the prior study, there has been no significant   interval change.  ASSESSMENT AND PLAN:  1. Chronic diastolic CHF - excellent response to torsemide and appears well compensated today on 20 mg daily. Continue low dose Coreg and torsemide at current dose.  2. History of essential HTN with orthostatic hypotension. Controlled on current regimen. Will need to continue to monitor. Hold parameters for Coreg 3. CAD s/p anterior MI 2009 with BMS to LAD. Cath 2013 showed patent stent. Normal Myoview Jan 2016.  Asymptomatic. 4. Hyponatremia  5.   Parkinson's disease 6.   Chron's disease.  Disposition: F/u in 6 months  Current medicines are reviewed at length with the patient today.  The patient did not have any concerns regarding medicines.  Signed, Peter [name] MD, [facility]    03/25/2016 10:09 AM     [facility]

## 2016-03-25 ENCOUNTER — Encounter: Payer: Self-pay | Admitting: Cardiology

## 2016-03-25 ENCOUNTER — Ambulatory Visit (INDEPENDENT_AMBULATORY_CARE_PROVIDER_SITE_OTHER): Payer: Medicare Other | Admitting: Cardiology

## 2016-03-25 VITALS — BP 110/70 | HR 86 | Ht 69.0 in | Wt 171.0 lb

## 2016-03-25 DIAGNOSIS — I10 Essential (primary) hypertension: Secondary | ICD-10-CM | POA: Diagnosis not present

## 2016-03-25 DIAGNOSIS — I951 Orthostatic hypotension: Secondary | ICD-10-CM

## 2016-03-25 DIAGNOSIS — I5032 Chronic diastolic (congestive) heart failure: Secondary | ICD-10-CM | POA: Diagnosis not present

## 2016-03-25 DIAGNOSIS — I2510 Atherosclerotic heart disease of native coronary artery without angina pectoris: Secondary | ICD-10-CM | POA: Diagnosis not present

## 2016-03-25 NOTE — Patient Instructions (Signed)
Continue your current therapy  I will see you in 6 months.   

## 2016-07-03 ENCOUNTER — Ambulatory Visit (INDEPENDENT_AMBULATORY_CARE_PROVIDER_SITE_OTHER): Payer: Medicare Other | Admitting: Neurology

## 2016-07-03 ENCOUNTER — Encounter: Payer: Self-pay | Admitting: Neurology

## 2016-07-03 VITALS — BP 121/73 | HR 87 | Ht 69.0 in | Wt 171.0 lb

## 2016-07-03 DIAGNOSIS — R413 Other amnesia: Secondary | ICD-10-CM | POA: Diagnosis not present

## 2016-07-03 DIAGNOSIS — G20 Parkinson's disease: Secondary | ICD-10-CM

## 2016-07-03 DIAGNOSIS — G20A1 Parkinson's disease without dyskinesia, without mention of fluctuations: Secondary | ICD-10-CM

## 2016-07-03 NOTE — Progress Notes (Signed)
Reason for visit: Parkinson's disease  [name] [name] is an 83 y.o. male  History of present illness:  [name] [name] is an 83 year old right-handed white male with a history of Parkinson's disease and a mild memory disturbance. The patient is remaining quite active, he is playing golf, he is in boxing therapy, he does water aerobics. The patient is sleeping well at night, he may nap several times during the day, however. The wife is concerned that he is not as socially interactive as he has been in the past. There have been no reports of significant changes in memory over time. The patient does not operate a motor vehicle. He has not had any falls, he may use a walker when he is outside of the house. He denies any problems with choking with swallowing, he has a good appetite. He returns for an evaluation. He is tolerating the Aricept and the Sinemet well.   Past Medical History:  Diagnosis Date  . Cellulitis   . Chronic diastolic CHF (congestive heart failure) (HCC)    a. EF initially 35-40% after MI 1/09; b. echo 7/08: EF 60%;  c. 11/2014 Echo: EF 65-70%, Gr 1 DD, mild MR.  . Coronary artery disease    a. s/p anterior STEMI 04/2007 with BMS-> LAD;  b. Cath 12/2011 patent stent;  c. low risk nuc 04/2014.  . Crohn's disease ([place])   . Diaphragmatic hernia without mention of obstruction or gangrene   . Diverticulosis of colon (without mention of hemorrhage)   . Fatty liver    a. on ultrasound of 10/2009  . Flatulence, eructation, and gas pain   . Fungal infection   . Gait disorder   . GERD (gastroesophageal reflux disease)   . HOH (hard of hearing)    Hearing aids  . Hyperlipidemia   . Hypertension   . Ischemic cardiomyopathy    a. EF initially 35-40% after MI 1/09; b. echo 7/08: EF 60%;  c. 11/2014 Echo: EF 65-70%, Gr 1 DD, mild MR.  . Melanoma ([place])   . Memory disorder   . Orthostatic hypotension   . Osteoporosis   . Other chronic nonalcoholic liver disease   . Other esophagitis   .  Parkinson's disease ([place])   . RBBB 09/02/2013  . Regional enteritis of small intestine ([place])   . Renal calculi   . Restless leg syndrome 03/22/2015  . 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 Urinary incontinence     Past Surgical History:  Procedure Laterality Date  . APPENDECTOMY    . BACK SURGERY     L3, L4, L5  . CARDIAC CATHETERIZATION  09/25/06   EF 35-40% but more recently 60%  . CATARACT EXTRACTION     Bilateral  . CHOLECYSTECTOMY    . Coronary artery stent placement    . Melanoma resection    . Partial bowel resection    . TONSILLECTOMY      Family History  Problem Relation Age of Onset  . Heart failure Mother   . Heart attack Mother   . Hypertension Mother   . Emphysema Father   . Heart disease Brother   . Heart disease Brother   . Coronary artery disease    . Colon cancer Brother   . Stomach cancer Neg Hx     Social history:  reports that he has never smoked. He has never used smokeless tobacco. He reports that he drinks about 1.2 - 1.8 oz of alcohol per week .  He reports that he does not use drugs.   No Known Allergies  Medications:  Prior to Admission medications   Medication Sig Start Date End Date Taking? Authorizing Provider  acetaminophen (TYLENOL) 325 MG tablet Take 650 mg by mouth every 6 (six) hours as needed for pain.   Yes Historical Provider, MD  ADVAIR DISKUS 250-50 MCG/DOSE AEPB Inhale 1 puff into the lungs daily as needed. For shortness of breath 01/24/14  Yes Historical Provider, MD  aspirin EC 81 MG tablet Take 81 mg by mouth daily.   Yes Historical Provider, MD  atorvastatin (LIPITOR) 40 MG tablet Take 40 mg by mouth daily.   Yes Historical Provider, MD  calcium carbonate (CALCIUM 600) 600 MG TABS tablet Take 600 mg by mouth daily with breakfast.   Yes Historical Provider, MD  carbidopa-levodopa (SINEMET IR) 25-100 MG tablet TAKE 1+1/2 TABLET 4 TIMES DAILY 11/08/15  Yes [name], MD  carvedilol (COREG) 3.125 MG tablet Take 3.125 mg  by mouth daily.    Yes Historical Provider, MD  Cholecalciferol (VITAMIN D-3) 1000 UNITS CAPS Take 1,000 Units by mouth daily.   Yes Historical Provider, MD  cholestyramine (QUESTRAN) 4 g packet TAKE 1 PACKET BY MOUTH TWICE DAILY WITH A MEAL 01/18/16  Yes [name], MD  donepezil (ARICEPT) 10 MG tablet Take 1 tablet (10 mg total) by mouth at bedtime. 10/24/15  Yes [name], MD  ipratropium (ATROVENT) 0.03 % nasal spray Place 2 sprays into both nostrils every 12 (twelve) hours.   Yes Historical Provider, MD  loperamide (IMODIUM) 2 MG capsule Take 2 mg by mouth 2 (two) times daily.   Yes Historical Provider, MD  midodrine (PROAMATINE) 5 MG tablet Take 1 tablet (5 mg total) by mouth 2 (two) times daily with a meal. 11/23/14  Yes Dayna N Dunn, PA-C  Multiple Vitamins-Minerals (CENTRUM SILVER PO) Take 1 tablet by mouth daily.   Yes Historical Provider, MD  nitroGLYCERIN (NITROSTAT) 0.4 MG SL tablet Place 0.4 mg under the tongue every 5 (five) minutes as needed for chest pain.   Yes Historical Provider, MD  pantoprazole (PROTONIX) 40 MG tablet Take 40 mg by mouth daily.  12/30/14  Yes Historical Provider, MD  potassium chloride SA (K-DUR,KLOR-CON) 20 MEQ tablet Take 40 mEq by mouth daily.   Yes Historical Provider, MD  torsemide (DEMADEX) 20 MG tablet Take 1 tablet (20 mg total) by mouth daily. 11/23/14  Yes Dayna N Dunn, PA-C  vitamin B-12 (CYANOCOBALAMIN) 1000 MCG tablet Take 1,000 mcg by mouth daily.   Yes Historical Provider, MD    ROS:  Out of a complete 14 system review of symptoms, the patient complains only of the following symptoms, and all other reviewed systems are negative.  Fatigue Runny nose Cough, shortness of breath Excessive thirst Swollen abdomen Restless legs, daytime sleepiness Incontinence of the bladder, frequency of urination Joint pain, muscle cramps, walking difficulty Memory loss, numbness Agitation, decreased concentration, anxiety   Blood pressure  121/73, pulse 87, height 5' 9"  (1.753 m), weight 171 lb (77.6 kg).  Physical Exam  General: The patient is alert and cooperative at the time of the examination.  Skin: No significant peripheral edema is noted.   Neurologic Exam  Mental status: The patient is alert and oriented x 3 at the time of the examination. The patient has apparent normal recent and remote memory, with an apparently normal attention span and concentration ability. Mini-Mental status examination done today shows a total score of  30/30.   Cranial nerves: Facial symmetry is present. Speech is normal, no aphasia or dysarthria is noted. Extraocular movements are full. Visual fields are full.Mild masking of the face is seen.  Motor: The patient has good strength in all 4 extremities.  Sensory examination: Soft touch sensation is symmetric on the face, arms, and legs.  Coordination: The patient has good finger-nose-finger and heel-to-shin bilaterally.  Gait and station: The patient  is able to arise from a seated position with arms crossed, once up, he is able to ambulate independently, symmetric arm swing is seen. Posture is somewhat stooped. Tandem gait was not attempted. Romberg is negative. No drift is seen.  Reflexes: Deep tendon reflexes are symmetric.   Assessment/Plan:  1. Parkinson's disease   2. Mild memory disturbance   3. Mild gait disturbance   The patient is doing remarkably well, he is progressing very slowly with his Parkinson's disease, he is remaining quite active. He will continue on his current dose of Sinemet and Aricept, he will follow-up in 5 months.   Jill Alexanders MD 07/03/2016 11:08 AM  [facility] [address] [place], [place] [address]  Phone [phone] Fax [phone]

## 2016-07-04 ENCOUNTER — Telehealth: Payer: Self-pay | Admitting: Neurology

## 2016-07-04 DIAGNOSIS — G20 Parkinson's disease: Secondary | ICD-10-CM

## 2016-07-04 NOTE — Telephone Encounter (Signed)
Patient's wife calling to get an order for the patient to have PT to see if he is steady enough to use a cane instead of a walker. Please fax order to [facility] at [phone].

## 2016-07-04 NOTE — Telephone Encounter (Signed)
I will order physical therapy at [facility].

## 2016-07-04 NOTE — Addendum Note (Signed)
Addended by: [name] on: 07/04/2016 05:59 PM   Modules accepted: Orders

## 2016-07-25 ENCOUNTER — Telehealth: Payer: Self-pay | Admitting: *Deleted

## 2016-07-25 NOTE — Telephone Encounter (Signed)
Faxed signed PT POC back to [facility] and fitness by CW,MD. Fax: [phone]. Received confirmation.

## 2016-08-06 ENCOUNTER — Encounter: Payer: Self-pay | Admitting: Gastroenterology

## 2016-08-06 ENCOUNTER — Ambulatory Visit (INDEPENDENT_AMBULATORY_CARE_PROVIDER_SITE_OTHER): Payer: Medicare Other | Admitting: Gastroenterology

## 2016-08-06 VITALS — BP 110/66 | HR 100 | Ht 69.0 in | Wt 173.0 lb

## 2016-08-06 DIAGNOSIS — K5080 Crohn's disease of both small and large intestine without complications: Secondary | ICD-10-CM

## 2016-08-06 NOTE — Progress Notes (Signed)
GI PROBLEM LIST:  1. Crohn's disease.Distant ileal and right colon resection by Dr. Gretta Cool in the very distant past. He was maintained on Pentasa, Entocort, and 31m of Purinethol for several years under the care of Dr. SLyla Son Hospitalization, May 2008, for acute myocardial infarction complicated by small bowel obstruction likely due to active Crohn's. Increased Purinethol to 100 mg daily and liver tests increased as well. Purinethol held and liver test normalized. The patient felt much better overall as well (less diarrhea, less fatigue). Winter, 2009: currently on 6 pills of Pentasa day feeling quite well overall. Summer, 2010 postoperative ileus following spine surgery (doubt active Crohn's flare). November, 2011: started cholestyramine with very good results (loose stools MUCH improved, only going 3-4 times a day). 03/2014 rov with increasing loose stools, urgency; started on uceris, levsin. Repeat colonoscopy 4 2016 showed anastomotic ulceration, narrowing of the ileocecal anastomosis. Biopsies showed chronic and active inflammation. He was started on Remicade 5 mg/kg after TB testing and hepatitis testing were proven to be negative with very good response to the remicade. June 2016, holding Remicade due to fungal cellulitis.01/2015 continuing to hold remicade, fungusremains a problem. 09/2015 doing well on questran twice daily. 2. Dysphagia February 2012:Barium esophagram showed mild narrowing at his GE junction. Was planning to perform dilation off Plavix (if okay with his cardiologist) when he had a bowel obstruction.  3. partial small bowel obstruction,February 2012. CT suggested transition point in the pelvis. Not clear if there was active Crohn's disease contributing to this, however he was put on prednisone in hosp; started 355ma day, taper off quickly. Recurrent obstruction 09/2012: CT scan IMPRESSION:Small bowel obstruction with apparent change in caliber within the anterior  right lower quadrant - right upper pelvis most likely due to an adhesion. Was admitted to hosp, put on steroids empirically however seems more likely to have been adhesive related than due to active IBD.    HPI: This is a very pleasant 83ear old man whom I last saw about 6 months ago. He is here with his wife today. This is for routine Crohn's follow-up  Takes questran twice daily.  This works very well.  On this regimen he will have 1-2 BMs, solid per day.  No abd pains.  No fevers or chills.  Chief complaint is Crohn's disease  ROS: complete GI ROS as described in HPI.  Constitutional:  No unintentional weight loss   Past Medical History:  Diagnosis Date  . Cellulitis   . Chronic diastolic CHF (congestive heart failure) (HCC)    a. EF initially 35-40% after MI 1/09; b. echo 7/08: EF 60%;  c. 11/2014 Echo: EF 65-70%, Gr 1 DD, mild MR.  . Coronary artery disease    a. s/p anterior STEMI 04/2007 with BMS-> LAD;  b. Cath 12/2011 patent stent;  c. low risk nuc 04/2014.  . Crohn's disease ([place]  . Diaphragmatic hernia without mention of obstruction or gangrene   . Diverticulosis of colon (without mention of hemorrhage)   . Fatty liver    a. on ultrasound of 10/2009  . Flatulence, eructation, and gas pain   . Fungal infection   . Gait disorder   . GERD (gastroesophageal reflux disease)   . HOH (hard of hearing)    Hearing aids  . Hyperlipidemia   . Hypertension   . Ischemic cardiomyopathy    a. EF initially 35-40% after MI 1/09; b. echo 7/08: EF 60%;  c. 11/2014 Echo: EF 65-70%, Gr 1 DD, mild  MR.  . Melanoma ([place])   . Memory disorder   . Orthostatic hypotension   . Osteoporosis   . Other chronic nonalcoholic liver disease   . Other esophagitis   . Parkinson's disease ([place])   . RBBB 09/02/2013  . Regional enteritis of small intestine ([place])   . Renal calculi   . Restless leg syndrome 03/22/2015  . Ulcerative (chronic) ileocolitis ([place])   . Urinary incontinence     Past  Surgical History:  Procedure Laterality Date  . APPENDECTOMY    . BACK SURGERY     L3, L4, L5  . CARDIAC CATHETERIZATION  09/25/06   EF 35-40% but more recently 60%  . CATARACT EXTRACTION     Bilateral  . CHOLECYSTECTOMY    . Coronary artery stent placement    . Melanoma resection    . Partial bowel resection    . TONSILLECTOMY      Current Outpatient Prescriptions  Medication Sig Dispense Refill  . acetaminophen (TYLENOL) 325 MG tablet Take 650 mg by mouth every 6 (six) hours as needed for pain.    Marland Kitchen ADVAIR DISKUS 250-50 MCG/DOSE AEPB Inhale 1 puff into the lungs daily as needed. For shortness of breath    . aspirin EC 81 MG tablet Take 81 mg by mouth daily.    Marland Kitchen atorvastatin (LIPITOR) 40 MG tablet Take 40 mg by mouth daily.    . calcium carbonate (CALCIUM 600) 600 MG TABS tablet Take 600 mg by mouth daily with breakfast.    . carbidopa-levodopa (SINEMET IR) 25-100 MG tablet TAKE 1+1/2 TABLET 4 TIMES DAILY 540 tablet 3  . carvedilol (COREG) 3.125 MG tablet Take 3.125 mg by mouth daily.     . Cholecalciferol (VITAMIN D-3) 1000 UNITS CAPS Take 1,000 Units by mouth daily.    . cholestyramine (QUESTRAN) 4 g packet TAKE 1 PACKET BY MOUTH TWICE DAILY WITH A MEAL 60 each 11  . donepezil (ARICEPT) 10 MG tablet Take 1 tablet (10 mg total) by mouth at bedtime. 90 tablet 3  . ipratropium (ATROVENT) 0.03 % nasal spray Place 2 sprays into both nostrils every 12 (twelve) hours.    Marland Kitchen loperamide (IMODIUM) 2 MG capsule Take 2 mg by mouth 2 (two) times daily.    . midodrine (PROAMATINE) 5 MG tablet Take 1 tablet (5 mg total) by mouth 2 (two) times daily with a meal. 60 tablet 9  . Multiple Vitamins-Minerals (CENTRUM SILVER PO) Take 1 tablet by mouth daily.    . nitroGLYCERIN (NITROSTAT) 0.4 MG SL tablet Place 0.4 mg under the tongue every 5 (five) minutes as needed for chest pain.    . pantoprazole (PROTONIX) 40 MG tablet Take 40 mg by mouth daily.     . potassium chloride SA (K-DUR,KLOR-CON) 20 MEQ  tablet Take 40 mEq by mouth daily.    Marland Kitchen torsemide (DEMADEX) 20 MG tablet Take 1 tablet (20 mg total) by mouth daily. 180 tablet 3  . vitamin B-12 (CYANOCOBALAMIN) 1000 MCG tablet Take 1,000 mcg by mouth daily.     No current facility-administered medications for this visit.     Allergies as of 08/06/2016  . (No Known Allergies)    Family History  Problem Relation Age of Onset  . Heart failure Mother   . Heart attack Mother   . Hypertension Mother   . Emphysema Father   . Heart disease Brother   . Heart disease Brother   . Coronary artery disease    . Colon cancer  Brother   . Stomach cancer Neg Hx     Social History   Social History  . Marital status: Married    Spouse name: N/A  . Number of children: 3  . Years of education: college   Occupational History  . CEO-retired [employer]  . CEO [employer]   Social History Main Topics  . Smoking status: Never Smoker  . Smokeless tobacco: Never Used  . Alcohol use 1.2 - 1.8 oz/week    2 - 3 Standard drinks or equivalent per week     Comment: occ   . Drug use: No  . Sexual activity: Not on file   Other Topics Concern  . Not on file   Social History Narrative   Lives w/ his wife at [facility]   Patient is right handed.   Patient drinks very little caffeine.     Physical Exam: BP 110/66   Pulse 100   Ht 5' 9"  (1.753 m)   Wt 173 lb (78.5 kg)   BMI 25.55 kg/m  Constitutional: Elderly, frail, walks with a walker Psychiatric: alert and oriented x3 Abdomen: soft, nontender, nondistended, no obvious ascites, no peritoneal signs, normal bowel sounds No peripheral edema noted in lower extremities  Assessment and plan: 83 y.o. male with long-standing Crohn's disease  He is doing well on Questran only for his Crohn's disease. He is taking this twice daily and on that regimen he has 1-2 bowel movements daily. I see no need to initiate further therapy, no lab tests or imaging studies.  He will return to see me in 6 months and sooner if needed.  Please see the "Patient Instructions" section for addition details about the plan.  Owens Loffler, MD [facility] 08/06/2016, 9:43 AM

## 2016-08-06 NOTE — Patient Instructions (Signed)
Please return to see Dr. Ardis Hughs in 6 months.

## 2016-09-22 NOTE — Progress Notes (Signed)
Cardiology Office Note Date:  09/25/2016  Patient ID:  Roth, Ress 12/22/1928, MRN [id number] PCP:  Shon Baton, MD  Cardiologist:  Dr. [name]   Chief Complaint: f/u CHF  History of Present Illness: DHYAN NOAH is a 83 y.o. male with history of CAD (s/p anterior STEMI 04/2007 with BMS-> LAD, patent 12/2011, low risk nuc 04/2014), chronic dCHF, HTN, HLD, Crohn's disease, GERD, Parkinson's disease, cellulitis, orthostatic hypotension requiring midodrine, GERD, RBBB, fatty liver, gait disorder, chronic lower extremity edema who presents for follow up.   He is seen with his wife today. He feels very well. He denies any SOB or chest pain. His orthostatic symptoms are better.  He has no edema. He weighs daily and it is stable.    Past Medical History:  Diagnosis Date  . Cellulitis   . Chronic diastolic CHF (congestive heart failure) (HCC)    a. EF initially 35-40% after MI 1/09; b. echo 7/08: EF 60%;  c. 11/2014 Echo: EF 65-70%, Gr 1 DD, mild MR.  . Coronary artery disease    a. s/p anterior STEMI 04/2007 with BMS-> LAD;  b. Cath 12/2011 patent stent;  c. low risk nuc 04/2014.  . Crohn's disease ([place])   . Diaphragmatic hernia without mention of obstruction or gangrene   . Diverticulosis of colon (without mention of hemorrhage)   . Fatty liver    a. on ultrasound of 10/2009  . Flatulence, eructation, and gas pain   . Fungal infection   . Gait disorder   . GERD (gastroesophageal reflux disease)   . HOH (hard of hearing)    Hearing aids  . Hyperlipidemia   . Hypertension   . Ischemic cardiomyopathy    a. EF initially 35-40% after MI 1/09; b. echo 7/08: EF 60%;  c. 11/2014 Echo: EF 65-70%, Gr 1 DD, mild MR.  . Melanoma ([place])   . Memory disorder   . Orthostatic hypotension   . Osteoporosis   . Other chronic nonalcoholic liver disease   . Other esophagitis   . Parkinson's disease ([place])   . RBBB 09/02/2013  . Regional enteritis of small intestine ([place])   . Renal calculi   .  Restless leg syndrome 03/22/2015  . Ulcerative (chronic) ileocolitis ([place])   . Urinary incontinence     Past Surgical History:  Procedure Laterality Date  . APPENDECTOMY    . BACK SURGERY     L3, L4, L5  . CARDIAC CATHETERIZATION  09/25/06   EF 35-40% but more recently 60%  . CATARACT EXTRACTION     Bilateral  . CHOLECYSTECTOMY    . Coronary artery stent placement    . Melanoma resection    . Partial bowel resection    . TONSILLECTOMY      Current Outpatient Prescriptions  Medication Sig Dispense Refill  . acetaminophen (TYLENOL) 325 MG tablet Take 650 mg by mouth every 6 (six) hours as needed for pain.    Marland Kitchen ADVAIR DISKUS 250-50 MCG/DOSE AEPB Inhale 1 puff into the lungs daily as needed. For shortness of breath    . aspirin EC 81 MG tablet Take 81 mg by mouth daily.    Marland Kitchen atorvastatin (LIPITOR) 40 MG tablet Take 40 mg by mouth daily.    . calcium carbonate (CALCIUM 600) 600 MG TABS tablet Take 600 mg by mouth daily with breakfast.    . carbidopa-levodopa (SINEMET IR) 25-100 MG tablet TAKE 1+1/2 TABLET 4 TIMES DAILY 540 tablet 3  . carvedilol (COREG)  3.125 MG tablet Take 3.125 mg by mouth daily.     . Cholecalciferol (VITAMIN D-3) 1000 UNITS CAPS Take 1,000 Units by mouth daily.    . cholestyramine (QUESTRAN) 4 g packet TAKE 1 PACKET BY MOUTH TWICE DAILY WITH A MEAL 60 each 11  . donepezil (ARICEPT) 10 MG tablet Take 1 tablet (10 mg total) by mouth at bedtime. 90 tablet 3  . ipratropium (ATROVENT) 0.03 % nasal spray Place 2 sprays into both nostrils every 12 (twelve) hours.    Marland Kitchen loperamide (IMODIUM) 2 MG capsule Take 2 mg by mouth 2 (two) times daily.    . midodrine (PROAMATINE) 5 MG tablet Take 1 tablet (5 mg total) by mouth 2 (two) times daily with a meal. 60 tablet 9  . Multiple Vitamins-Minerals (CENTRUM SILVER PO) Take 1 tablet by mouth daily.    . nitroGLYCERIN (NITROSTAT) 0.4 MG SL tablet Place 0.4 mg under the tongue every 5 (five) minutes as needed for chest pain.    .  pantoprazole (PROTONIX) 40 MG tablet Take 40 mg by mouth daily.     . potassium chloride SA (K-DUR,KLOR-CON) 20 MEQ tablet Take 40 mEq by mouth daily.    Marland Kitchen torsemide (DEMADEX) 20 MG tablet Take 1 tablet (20 mg total) by mouth daily. 180 tablet 3  . vitamin B-12 (CYANOCOBALAMIN) 1000 MCG tablet Take 1,000 mcg by mouth daily.     No current facility-administered medications for this visit.     Allergies:   Patient has no known allergies.   Social History:  The patient  reports that he has never smoked. He has never used smokeless tobacco. He reports that he drinks about 1.2 - 1.8 oz of alcohol per week . He reports that he does not use drugs.   Family History:  The patient's family history includes Colon cancer in his brother; Emphysema in his father; Heart attack in his mother; Heart disease in his brother and brother; Heart failure in his mother; Hypertension in his mother.  ROS:  Please see the history of present illness.   All other systems are reviewed and otherwise negative.   PHYSICAL EXAM:  VS:  BP 114/68   Pulse 78   Ht 5' 6"  (1.676 m)   Wt 170 lb (77.1 kg)   BMI 27.44 kg/m  BMI: Body mass index is 27.44 kg/m. O2 sat 93% Well nourished, well developed WM in no acute distress  HEENT: normocephalic, atraumatic  Neck: no JVD, carotid bruits or masses Cardiac:  normal S1, S2; reg rhythm with mildly elevated rate; no murmurs, rubs, or gallops Lungs:  Clear, no wheezing, rhonchi or rales  Abd: rounded, distended, +BS, nontender MS: no deformity or atrophy Ext: No LE edema  Skin: warm and dry, no rash Neuro:  moves all extremities spontaneously, no focal abnormalities noted, follows commands Psych: euthymic mood, somewhat flat affect   EKG:  Not is  done today. NSR rate 78. RBBB. No acute change. I have personally reviewed and interpreted this study   Recent Labs: 12/12/2015: Hemoglobin 13.5; Platelets 228  No results found for requested labs within last 8760 hours.   CrCl  cannot be calculated (Patient's most recent lab result is older than the maximum 21 days allowed.).   Labs dated 09/03/16 cholesterol 132, triglycerides 113, LDL 47, HDL 62. LFTs and TSH normal  A1c 5.9%,  BUN 28, creatinine 1.2. PSA 2.33.  Wt Readings from Last 3 Encounters:  09/25/16 170 lb (77.1 kg)  08/06/16 173 lb (  78.5 kg)  07/03/16 171 lb (77.6 kg)     Other studies reviewed: Additional studies/records reviewed today include:  Echo 11/14/15: Study Conclusions  - Left ventricle: The cavity size was normal. There was mild focal   basal and mild concentric hypertrophy of the septum. Systolic   function was vigorous. The estimated ejection fraction was in the   range of 65% to 70%. Wall motion was normal; there were no   regional wall motion abnormalities. Doppler parameters are   consistent with abnormal left ventricular relaxation (grade 1   diastolic dysfunction). - Mitral valve: There was mild regurgitation.  Impressions:  - Compared to the prior study, there has been no significant   interval change.  ASSESSMENT AND PLAN:  1. Chronic diastolic CHF - well compensated on current demadex dose.  Continue current therapy. 2. History of essential HTN with orthostatic hypotension. Controlled on current regimen.  3. CAD s/p anterior MI 2009 with BMS to LAD. Cath 2013 showed patent stent. Normal Myoview Jan 2016.  Asymptomatic. 4. Hyponatremia  5.   Parkinson's disease 6.   Chron's disease.  Disposition: F/u in 6 months  Current medicines are reviewed at length with the patient today.  The patient did not have any concerns regarding medicines.  Signed, Robin Pafford [name] MD, [facility]    09/25/2016 10:43 AM     [facility]

## 2016-09-25 ENCOUNTER — Other Ambulatory Visit: Payer: Self-pay | Admitting: Neurology

## 2016-09-25 ENCOUNTER — Encounter: Payer: Self-pay | Admitting: Cardiology

## 2016-09-25 ENCOUNTER — Ambulatory Visit (INDEPENDENT_AMBULATORY_CARE_PROVIDER_SITE_OTHER): Payer: Medicare Other | Admitting: Cardiology

## 2016-09-25 VITALS — BP 114/68 | HR 78 | Ht 66.0 in | Wt 170.0 lb

## 2016-09-25 DIAGNOSIS — I10 Essential (primary) hypertension: Secondary | ICD-10-CM

## 2016-09-25 DIAGNOSIS — I2510 Atherosclerotic heart disease of native coronary artery without angina pectoris: Secondary | ICD-10-CM | POA: Diagnosis not present

## 2016-09-25 DIAGNOSIS — I5032 Chronic diastolic (congestive) heart failure: Secondary | ICD-10-CM

## 2016-09-25 DIAGNOSIS — I951 Orthostatic hypotension: Secondary | ICD-10-CM

## 2016-09-25 NOTE — Patient Instructions (Signed)
Continue your current therapy  I will see you in 6 months.   

## 2016-11-07 ENCOUNTER — Other Ambulatory Visit: Payer: Self-pay | Admitting: Neurology

## 2016-12-18 ENCOUNTER — Ambulatory Visit (INDEPENDENT_AMBULATORY_CARE_PROVIDER_SITE_OTHER): Payer: Medicare Other | Admitting: Neurology

## 2016-12-18 ENCOUNTER — Encounter: Payer: Self-pay | Admitting: Neurology

## 2016-12-18 VITALS — BP 116/70 | HR 84 | Ht 66.0 in | Wt 169.5 lb

## 2016-12-18 DIAGNOSIS — G20 Parkinson's disease: Secondary | ICD-10-CM

## 2016-12-18 DIAGNOSIS — G2581 Restless legs syndrome: Secondary | ICD-10-CM

## 2016-12-18 DIAGNOSIS — R413 Other amnesia: Secondary | ICD-10-CM

## 2016-12-18 DIAGNOSIS — R269 Unspecified abnormalities of gait and mobility: Secondary | ICD-10-CM

## 2016-12-18 NOTE — Progress Notes (Signed)
Reason for visit: Parkinson's disease  [name] [name] is an 83 y.o. male  History of present illness:  [name] [name] is an 83 year old right-handed white male with a history of Parkinson's disease. The patient has a gait disorder associated with this and a progressive memory disorder. The patient is on Aricept, he takes Sinemet 25/100 mg tablets, 1-1/2 tablet 4 times daily. The patient no longer plays golf, he is involved with [employer] boxing therapy for Parkinson's disease and he works out in [employer] on a regular basis. The patient otherwise has very little physical activity, he will nap several times during the day. He more recently has had some shortness of breath with physical activity. He denies any falls, he uses a walker for ambulation. He has not had any hallucinations. He sleeps well at night and his wife denies that he snores or acts out his dreams. He denies any problems with choking or swallowing. He returns for an evaluation. The patient and his wife indicate that he has been stable with his physical abilities since last seen. His assessment at the [facility] boxing therapy indicates a 25% improvement in his balance over the last 6 months.  Past Medical History:  Diagnosis Date  . Cellulitis   . Chronic diastolic CHF (congestive heart failure) (HCC)    a. EF initially 35-40% after MI 1/09; b. echo 7/08: EF 60%;  c. 11/2014 Echo: EF 65-70%, Gr 1 DD, mild MR.  . Coronary artery disease    a. s/p anterior STEMI 04/2007 with BMS-> LAD;  b. Cath 12/2011 patent stent;  c. low risk nuc 04/2014.  . Crohn's disease ([place])   . Diaphragmatic hernia without mention of obstruction or gangrene   . Diverticulosis of colon (without mention of hemorrhage)   . Fatty liver    a. on ultrasound of 10/2009  . Flatulence, eructation, and gas pain   . Fungal infection   . Gait disorder   . GERD (gastroesophageal reflux disease)   . HOH (hard of hearing)    Hearing aids  . Hyperlipidemia   .  Hypertension   . Ischemic cardiomyopathy    a. EF initially 35-40% after MI 1/09; b. echo 7/08: EF 60%;  c. 11/2014 Echo: EF 65-70%, Gr 1 DD, mild MR.  . Melanoma ([place])   . Memory disorder   . Orthostatic hypotension   . Osteoporosis   . Other chronic nonalcoholic liver disease   . Other esophagitis   . Parkinson's disease ([place])   . RBBB 09/02/2013  . Regional enteritis of small intestine ([place])   . Renal calculi   . Restless leg syndrome 03/22/2015  . 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 Urinary incontinence     Past Surgical History:  Procedure Laterality Date  . APPENDECTOMY    . BACK SURGERY     L3, L4, L5  . CARDIAC CATHETERIZATION  09/25/06   EF 35-40% but more recently 60%  . CATARACT EXTRACTION     Bilateral  . CHOLECYSTECTOMY    . Coronary artery stent placement    . Melanoma resection    . Partial bowel resection    . TONSILLECTOMY      Family History  Problem Relation Age of Onset  . Heart failure Mother   . Heart attack Mother   . Hypertension Mother   . Emphysema Father   . Heart disease Brother   . Heart disease Brother   . Coronary artery disease Unknown   .  Colon cancer Brother   . Stomach cancer Neg Hx     Social history:  reports that he has never smoked. He has never used smokeless tobacco. He reports that he drinks about 1.2 - 1.8 oz of alcohol per week . He reports that he does not use drugs.   No Known Allergies  Medications:  Prior to Admission medications   Medication Sig Start Date End Date Taking? Authorizing Provider  acetaminophen (TYLENOL) 325 MG tablet Take 650 mg by mouth every 6 (six) hours as needed for pain.   Yes [provider]  ADVAIR DISKUS 250-50 MCG/DOSE AEPB Inhale 1 puff into the lungs daily as needed. For shortness of breath 01/24/14  Yes [provider]  aspirin EC 81 MG tablet Take 81 mg by mouth daily.   Yes [provider]  atorvastatin (LIPITOR) 40 MG tablet Take 40 mg by mouth daily.    Yes [provider]  calcium carbonate (CALCIUM 600) 600 MG TABS tablet Take 600 mg by mouth daily with breakfast.   Yes [provider]  carbidopa-levodopa (SINEMET IR) 25-100 MG tablet TAKE 1+1/2 TABLET FOUR TIMES DAILY 11/07/16  Yes [name], MD  carvedilol (COREG) 3.125 MG tablet Take 3.125 mg by mouth daily.    Yes [provider]  Cholecalciferol (VITAMIN D-3) 1000 UNITS CAPS Take 1,000 Units by mouth daily.   Yes [provider]  cholestyramine (QUESTRAN) 4 g packet TAKE 1 PACKET BY MOUTH TWICE DAILY WITH A MEAL 01/18/16  Yes [name], MD  donepezil (ARICEPT) 10 MG tablet TAKE ONE TABLET AT BEDTIME 09/25/16  Yes [name], MD  ipratropium (ATROVENT) 0.03 % nasal spray Place 2 sprays into both nostrils every 12 (twelve) hours.   Yes [provider]  loperamide (IMODIUM) 2 MG capsule Take 2 mg by mouth 2 (two) times daily.   Yes [provider]  midodrine (PROAMATINE) 5 MG tablet Take 1 tablet (5 mg total) by mouth 2 (two) times daily with a meal. 11/23/14  Yes Dunn, Dayna N, PA-C  Multiple Vitamins-Minerals (CENTRUM SILVER PO) Take 1 tablet by mouth daily.   Yes [provider]  nitroGLYCERIN (NITROSTAT) 0.4 MG SL tablet Place 0.4 mg under the tongue every 5 (five) minutes as needed for chest pain.   Yes [provider]  pantoprazole (PROTONIX) 40 MG tablet Take 40 mg by mouth daily.  12/30/14  Yes [provider]  potassium chloride SA (K-DUR,KLOR-CON) 20 MEQ tablet Take 40 mEq by mouth daily.   Yes [provider]  torsemide (DEMADEX) 20 MG tablet Take 1 tablet (20 mg total) by mouth daily. 11/23/14  Yes Dunn, Dayna N, PA-C  vitamin B-12 (CYANOCOBALAMIN) 1000 MCG tablet Take 1,000 mcg by mouth daily.   Yes [provider]    ROS:  Out of a complete 14 system review of symptoms, the patient complains only of the following symptoms, and all other reviewed systems are  negative.  Fatigue Drooling Cough, wheezing, shortness of breath, choking Excessive eating frequency of urination, urinary urgency Joint pain, back pain, walking difficulty Memory loss, dizziness Agitation, anxiety Moles, itching   Blood pressure 116/70, pulse 84, height 5' 6"  (1.676 m), weight 169 lb 8 oz (76.9 kg).  Physical Exam  General: The patient is alert and cooperative at the time of the examination.The patient is moderately obese.  Skin: No significant peripheral edema is noted.   Neurologic Exam  Mental status: The patient is alert  and oriented x 3 at the time of the examination. The patient has apparent normal recent and remote memory, with an apparently normal attention span and concentration ability.   Cranial nerves: Facial symmetry is present. Speech is normal, no aphasia or dysarthria is noted. Extraocular movements are full. Visual fields are full.Masking of the face is seen.  Motor: The patient has good strength in all 4 extremities.  Sensory examination: Soft touch sensation is symmetric on the face, arms, and legs.  Coordination: The patient has good finger-nose-finger and heel-to-shin bilaterally.  Gait and station: The patient has the ability to walk independently, he usually uses a walker. The posture is somewhat stooped. The patient has some instability with turns.  Romberg is negative. No drift is seen.  Reflexes: Deep tendon reflexes are symmetric.   Assessment/Plan:  1. Parkinson's disease   2. Gait disorder   3. Memory disorder   The patient has been relatively stable with his physical abilities with ambulation. His memory may be declining slightly over time. The patient is having some drowsiness, possibly related to the use of Sinemet. We may consider long-acting preparation of this medication in the future. The patient will follow-up in 4 months.   Jill Alexanders MD 12/18/2016 10:50 AM  [facility] [address] [place], [place] [address]  Phone [phone] Fax [phone]

## 2017-01-01 ENCOUNTER — Encounter: Payer: Self-pay | Admitting: Gastroenterology

## 2017-01-16 ENCOUNTER — Other Ambulatory Visit: Payer: Self-pay

## 2017-01-16 MED ORDER — CHOLESTYRAMINE 4 G PO PACK: PACK | ORAL | 11 refills | Status: DC

## 2017-03-21 ENCOUNTER — Ambulatory Visit: Payer: Medicare Other | Admitting: Gastroenterology

## 2017-03-21 ENCOUNTER — Encounter: Payer: Self-pay | Admitting: Gastroenterology

## 2017-03-21 VITALS — BP 122/60 | HR 87 | Ht 66.0 in | Wt 171.0 lb

## 2017-03-21 DIAGNOSIS — K5080 Crohn's disease of both small and large intestine without complications: Secondary | ICD-10-CM

## 2017-03-21 NOTE — Progress Notes (Signed)
GI PROBLEM LIST:  1. Crohn's disease.Distant ileal and right colon resection by Dr. Gretta Cool in the very distant past. He was maintained on Pentasa, Entocort, and 52m of Purinethol for several years under the care of Dr. SLyla Son Hospitalization, May 2008, for acute myocardial infarction complicated by small bowel obstruction likely due to active Crohn's. Increased Purinethol to 100 mg daily and liver tests increased as well. Purinethol held and liver test normalized. The patient felt much better overall as well (less diarrhea, less fatigue). Winter, 2009: currently on 6 pills of Pentasa day feeling quite well overall. Summer, 2010 postoperative ileus following spine surgery (doubt active Crohn's flare). November, 2011: started cholestyramine with very good results (loose stools MUCH improved, only going 3-4 times a day). 03/2014 rov with increasing loose stools, urgency; started on uceris, levsin. Repeat colonoscopy 4 2016 showed anastomotic ulceration, narrowing of the ileocecal anastomosis. Biopsies showed chronic and active inflammation. He was started on Remicade 5 mg/kg after TB testing and hepatitis testing were proven to be negative with very good response to the remicade. June 2016, holding Remicade due to fungal cellulitis.01/2015 continuing to hold remicade, fungusremains a problem. 09/2015 doing well on questran twice daily. 2. Dysphagia February 2012:Barium esophagram showed mild narrowing at his GE junction. Was planning to perform dilation off Plavix (if okay with his cardiologist) when he had a bowel obstruction.  3. partial small bowel obstruction,February 2012. CT suggested transition point in the pelvis. Not clear if there was active Crohn's disease contributing to this, however he was put on prednisone in hosp; started 364ma day, taper off quickly. Recurrent obstruction 09/2012: CT scan IMPRESSION:Small bowel obstruction with apparent change in caliber within the anterior  right lower quadrant - right upper pelvis most likely due to an adhesion. Was admitted to hosp, put on steroids empirically however seems more likely to have been adhesive related than due to active IBD.   HPI: This is a very pleasant 83 year old man whom I last saw about 6 months ago.  He is here with his wife today.  He is using a walker to get around.  He is having increasing memory difficulties from his Parkinson's.  Bowels same as usual. Occasionally urgency.  Questran BID. 2-3 BMs daily on that regimen.  No overt bleeding.  No abdominal pains.  His wife does feel that his abdomen is a little more bloated than usual.  He has had no nausea or vomiting   Chief complaint is Crohn's disease  ROS: complete GI ROS as described in HPI, all other review negative.  Constitutional:  No unintentional weight loss   Past Medical History:  Diagnosis Date  . Cellulitis   . Chronic diastolic CHF (congestive heart failure) (HCC)    a. EF initially 35-40% after MI 1/09; b. echo 7/08: EF 60%;  c. 11/2014 Echo: EF 65-70%, Gr 1 DD, mild MR.  . Coronary artery disease    a. s/p anterior STEMI 04/2007 with BMS-> LAD;  b. Cath 12/2011 patent stent;  c. low risk nuc 04/2014.  . Crohn's disease ([place]  . Diaphragmatic hernia without mention of obstruction or gangrene   . Diverticulosis of colon (without mention of hemorrhage)   . Fatty liver    a. on ultrasound of 10/2009  . Flatulence, eructation, and gas pain   . Fungal infection   . Gait disorder   . GERD (gastroesophageal reflux disease)   . HOH (hard of hearing)    Hearing aids  . Hyperlipidemia   .  Hypertension   . Ischemic cardiomyopathy    a. EF initially 35-40% after MI 1/09; b. echo 7/08: EF 60%;  c. 11/2014 Echo: EF 65-70%, Gr 1 DD, mild MR.  . Melanoma ([place])   . Memory disorder   . Orthostatic hypotension   . Osteoporosis   . Other chronic nonalcoholic liver disease   . Other esophagitis   . Parkinson's disease ([place])   . RBBB 09/02/2013   . Regional enteritis of small intestine ([place])   . Renal calculi   . Restless leg syndrome 03/22/2015  . Ulcerative (chronic) ileocolitis ([place])   . Urinary incontinence     Past Surgical History:  Procedure Laterality Date  . APPENDECTOMY    . BACK SURGERY     L3, L4, L5  . CARDIAC CATHETERIZATION  09/25/06   EF 35-40% but more recently 60%  . CATARACT EXTRACTION     Bilateral  . CHOLECYSTECTOMY    . Coronary artery stent placement    . Melanoma resection    . Partial bowel resection    . TONSILLECTOMY      Current Outpatient Medications  Medication Sig Dispense Refill  . acetaminophen (TYLENOL) 325 MG tablet Take 650 mg by mouth every 6 (six) hours as needed for pain.    Marland Kitchen ADVAIR DISKUS 250-50 MCG/DOSE AEPB Inhale 1 puff into the lungs daily as needed. For shortness of breath    . aspirin EC 81 MG tablet Take 81 mg by mouth daily.    Marland Kitchen atorvastatin (LIPITOR) 40 MG tablet Take 40 mg by mouth daily.    . calcium carbonate (CALCIUM 600) 600 MG TABS tablet Take 600 mg by mouth daily with breakfast.    . carbidopa-levodopa (SINEMET IR) 25-100 MG tablet TAKE 1+1/2 TABLET FOUR TIMES DAILY 540 tablet 3  . carvedilol (COREG) 3.125 MG tablet Take 3.125 mg by mouth daily.     . Cholecalciferol (VITAMIN D-3) 1000 UNITS CAPS Take 1,000 Units by mouth daily.    . cholestyramine (QUESTRAN) 4 g packet TAKE 1 PACKET BY MOUTH TWICE DAILY WITH A MEAL 60 each 11  . donepezil (ARICEPT) 10 MG tablet TAKE ONE TABLET AT BEDTIME 90 tablet 3  . ipratropium (ATROVENT) 0.03 % nasal spray Place 2 sprays into both nostrils every 12 (twelve) hours.    Marland Kitchen loperamide (IMODIUM) 2 MG capsule Take 2 mg by mouth 2 (two) times daily.    . midodrine (PROAMATINE) 5 MG tablet Take 1 tablet (5 mg total) by mouth 2 (two) times daily with a meal. 60 tablet 9  . Multiple Vitamins-Minerals (CENTRUM SILVER PO) Take 1 tablet by mouth daily.    . nitroGLYCERIN (NITROSTAT) 0.4 MG SL tablet Place 0.4 mg under the tongue every 5  (five) minutes as needed for chest pain.    . pantoprazole (PROTONIX) 40 MG tablet Take 40 mg by mouth daily.     . potassium chloride SA (K-DUR,KLOR-CON) 20 MEQ tablet Take 40 mEq by mouth daily.    Marland Kitchen torsemide (DEMADEX) 20 MG tablet Take 1 tablet (20 mg total) by mouth daily. 180 tablet 3  . vitamin B-12 (CYANOCOBALAMIN) 1000 MCG tablet Take 1,000 mcg by mouth daily.     No current facility-administered medications for this visit.     Allergies as of 03/21/2017  . (No Known Allergies)    Family History  Problem Relation Age of Onset  . Heart failure Mother   . Heart attack Mother   . Hypertension Mother   .  Emphysema Father   . Heart disease Brother   . Heart disease Brother   . Coronary artery disease Unknown   . Colon cancer Brother        older at dx  . Stomach cancer Neg Hx     Social History   Socioeconomic History  . Marital status: Married    Spouse name: [name]  . Number of children: 3  . Years of education: college  . Highest education level: Not on file  Social Needs  . Financial resource strain: Not on file  . Food insecurity - worry: Not on file  . Food insecurity - inability: Not on file  . Transportation needs - medical: Not on file  . Transportation needs - non-medical: Not on file  Occupational History  . Occupation: CEO-retired    Employer: [employer]  . Occupation: Licensed conveyancer: [employer]  Tobacco Use  . Smoking status: Never Smoker  . Smokeless tobacco: Never Used  Substance and Sexual Activity  . Alcohol use: Yes    Alcohol/week: 1.2 - 1.8 oz    Types: 2 - 3 Standard drinks or equivalent per week    Comment: occ   . Drug use: No  . Sexual activity: Not on file  Other Topics Concern  . Not on file  Social History Narrative   Lives w/ his wife at [place]   Patient is right handed.   Patient drinks very little caffeine.     Physical Exam: Wt 171 lb (77.6 kg)   BMI 27.60 kg/m   Constitutional: Elderly, frail Psychiatric: alert and oriented x3 Abdomen: soft, nontender, mildly distended abdomen, no obvious ascites, no peritoneal signs, normal bowel sounds No peripheral edema noted in lower extremities  Assessment and plan: 83 y.o. male with Crohn's disease  He is not on any specific Crohn's medicines and has not been for quite a while now.  He takes Questran twice daily and on that regimen he has 2-3 soft bowel movements a day.  He is a bit distended this is been a gradual process per his wife.  He has no nausea or vomiting to suggest obstruction.  Certainly he is at risk for recurrent small bowel obstruction as he suffered a 3 or 4 years ago, documented above.  They know to call if he starts to have significant vomiting or abdominal pains.  Otherwise he will return to see me in 6 months and sooner if any problems.  Please see the "Patient Instructions" section for addition details about the plan.  Owens Loffler, MD [facility] 03/21/2017, 11:16 AM

## 2017-03-21 NOTE — Patient Instructions (Addendum)
Please return to see Dr. Ardis Hughs in 6 months.  Stay on questran twice daily.   Normal BMI (Body Mass Index- based on height and weight) is between 23 and 30. Your BMI today is Body mass index is 27.6 kg/m. [name] [name] Please consider follow up  regarding your BMI with your Primary Care Provider.

## 2017-04-11 DIAGNOSIS — L84 Corns and callosities: Secondary | ICD-10-CM | POA: Diagnosis not present

## 2017-04-11 DIAGNOSIS — B078 Other viral warts: Secondary | ICD-10-CM | POA: Diagnosis not present

## 2017-04-11 DIAGNOSIS — L821 Other seborrheic keratosis: Secondary | ICD-10-CM | POA: Diagnosis not present

## 2017-04-11 DIAGNOSIS — Z85820 Personal history of malignant melanoma of skin: Secondary | ICD-10-CM | POA: Diagnosis not present

## 2017-04-11 DIAGNOSIS — D1801 Hemangioma of skin and subcutaneous tissue: Secondary | ICD-10-CM | POA: Diagnosis not present

## 2017-04-11 DIAGNOSIS — L814 Other melanin hyperpigmentation: Secondary | ICD-10-CM | POA: Diagnosis not present

## 2017-04-11 DIAGNOSIS — Z85828 Personal history of other malignant neoplasm of skin: Secondary | ICD-10-CM | POA: Diagnosis not present

## 2017-04-11 DIAGNOSIS — L738 Other specified follicular disorders: Secondary | ICD-10-CM | POA: Diagnosis not present

## 2017-04-11 DIAGNOSIS — D225 Melanocytic nevi of trunk: Secondary | ICD-10-CM | POA: Diagnosis not present

## 2017-04-11 DIAGNOSIS — L570 Actinic keratosis: Secondary | ICD-10-CM | POA: Diagnosis not present

## 2017-05-02 ENCOUNTER — Encounter: Payer: Self-pay | Admitting: Neurology

## 2017-05-02 ENCOUNTER — Ambulatory Visit: Payer: PPO | Admitting: Neurology

## 2017-05-02 VITALS — BP 128/74 | HR 100 | Ht 66.0 in | Wt 168.5 lb

## 2017-05-02 DIAGNOSIS — G20 Parkinson's disease: Secondary | ICD-10-CM | POA: Diagnosis not present

## 2017-05-02 MED ORDER — CARBIDOPA-LEVODOPA ER 50-200 MG PO TBCR: 1.0000 | EXTENDED_RELEASE_TABLET | Freq: Three times a day (TID) | ORAL | 3 refills | Status: DC

## 2017-05-02 NOTE — Progress Notes (Signed)
Reason for visit: Parkinson's disease  [name] [name] is an 84 y.o. male  History of present illness:  [name] [name] is an 84 year old right-handed white male with a history of Parkinson's disease associated with a gait disorder.  The patient has been using a Rollator, he has not had any falls.  He is having a lot of drowsiness during the day when he is inactive.  The patient is on Sinemet 25/100 mg tablets taking 1.5 tablets 4 times daily.  He is not having hallucinations, he does report some troubles with memory.  He sleeps well at night, he does have some urinary incontinence.  The patient denies problems with swallowing or choking.  He returns to this office for an evaluation.  Past Medical History:  Diagnosis Date  . Cellulitis   . Chronic diastolic CHF (congestive heart failure) (HCC)    a. EF initially 35-40% after MI 1/09; b. echo 7/08: EF 60%;  c. 11/2014 Echo: EF 65-70%, Gr 1 DD, mild MR.  . Coronary artery disease    a. s/p anterior STEMI 04/2007 with BMS-> LAD;  b. Cath 12/2011 patent stent;  c. low risk nuc 04/2014.  . Crohn's disease ([place])   . Diaphragmatic hernia without mention of obstruction or gangrene   . Diverticulosis of colon (without mention of hemorrhage)   . Fatty liver    a. on ultrasound of 10/2009  . Flatulence, eructation, and gas pain   . Fungal infection   . Gait disorder   . GERD (gastroesophageal reflux disease)   . HOH (hard of hearing)    Hearing aids  . Hyperlipidemia   . Hypertension   . Ischemic cardiomyopathy    a. EF initially 35-40% after MI 1/09; b. echo 7/08: EF 60%;  c. 11/2014 Echo: EF 65-70%, Gr 1 DD, mild MR.  . Melanoma ([place])   . Memory disorder   . Orthostatic hypotension   . Osteoporosis   . Other chronic nonalcoholic liver disease   . Other esophagitis   . Parkinson's disease ([place])   . RBBB 09/02/2013  . Regional enteritis of small intestine ([place])   . Renal calculi   . Restless leg syndrome 03/22/2015  . 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 Urinary incontinence     Past Surgical History:  Procedure Laterality Date  . APPENDECTOMY    . BACK SURGERY     L3, L4, L5  . CARDIAC CATHETERIZATION  09/25/06   EF 35-40% but more recently 60%  . CATARACT EXTRACTION     Bilateral  . CHOLECYSTECTOMY    . Coronary artery stent placement    . Melanoma resection    . Partial bowel resection    . TONSILLECTOMY      Family History  Problem Relation Age of Onset  . Heart failure Mother   . Heart attack Mother   . Hypertension Mother   . Emphysema Father   . Heart disease Brother   . Heart disease Brother   . Coronary artery disease Unknown   . Colon cancer Brother        older at dx  . Stomach cancer Neg Hx     Social history:  reports that  has never smoked. he has never used smokeless tobacco. He reports that he drinks about 1.2 - 1.8 oz of alcohol per week. He reports that he does not use drugs.   No Known Allergies  Medications:  Prior to Admission medications   Medication Sig  Start Date End Date Taking? Authorizing Provider  acetaminophen (TYLENOL) 325 MG tablet Take 650 mg by mouth every 6 (six) hours as needed for pain.   Yes [provider]  ADVAIR DISKUS 250-50 MCG/DOSE AEPB Inhale 1 puff into the lungs daily as needed. For shortness of breath 01/24/14  Yes [provider]  aspirin EC 81 MG tablet Take 81 mg by mouth daily.   Yes [provider]  atorvastatin (LIPITOR) 40 MG tablet Take 40 mg by mouth daily.   Yes [provider]  calcium carbonate (CALCIUM 600) 600 MG TABS tablet Take 600 mg by mouth daily with breakfast.   Yes [provider]  carbidopa-levodopa (SINEMET IR) 25-100 MG tablet TAKE 1+1/2 TABLET FOUR TIMES DAILY 11/07/16  Yes [name], MD  carvedilol (COREG) 3.125 MG tablet Take 3.125 mg by mouth daily.    Yes [provider]  Cholecalciferol (VITAMIN D-3) 1000 UNITS CAPS Take 1,000 Units by mouth daily.   Yes  [provider]  cholestyramine (QUESTRAN) 4 g packet TAKE 1 PACKET BY MOUTH TWICE DAILY WITH A MEAL 01/16/17  Yes [name], MD  donepezil (ARICEPT) 10 MG tablet TAKE ONE TABLET AT BEDTIME 09/25/16  Yes [name], MD  ipratropium (ATROVENT) 0.03 % nasal spray Place 2 sprays into both nostrils every 12 (twelve) hours.   Yes [provider]  loperamide (IMODIUM) 2 MG capsule Take 2 mg by mouth 2 (two) times daily.   Yes [provider]  midodrine (PROAMATINE) 5 MG tablet Take 1 tablet (5 mg total) by mouth 2 (two) times daily with a meal. 11/23/14  Yes Dunn, Dayna N, PA-C  Multiple Vitamins-Minerals (CENTRUM SILVER PO) Take 1 tablet by mouth daily.   Yes [provider]  nitroGLYCERIN (NITROSTAT) 0.4 MG SL tablet Place 0.4 mg under the tongue every 5 (five) minutes as needed for chest pain.   Yes [provider]  pantoprazole (PROTONIX) 40 MG tablet Take 40 mg by mouth daily.  12/30/14  Yes [provider]  potassium chloride SA (K-DUR,KLOR-CON) 20 MEQ tablet Take 40 mEq by mouth daily.   Yes [provider]  torsemide (DEMADEX) 20 MG tablet Take 1 tablet (20 mg total) by mouth daily. 11/23/14  Yes Dunn, Dayna N, PA-C  vitamin B-12 (CYANOCOBALAMIN) 1000 MCG tablet Take 1,000 mcg by mouth daily.   Yes [provider]    ROS:  Out of a complete 14 system review of symptoms, the patient complains only of the following symptoms, and all other reviewed systems are negative.  Fatigue Runny nose Cough, wheezing, shortness of breath Excessive thirst Diarrhea, incontinence of the bowels Daytime sleepiness, acting out dreams Incontinence of the bladder, frequency of urination Memory loss, dizziness, numbness Agitation, decreased concentration, anxiety  Blood pressure 128/74, pulse 100, height 5' 6"  (1.676 m), weight 168 lb 8 oz (76.4 kg).  Physical Exam  General: The patient is alert and cooperative at the time of  the examination.  Skin: No significant peripheral edema is noted.   Neurologic Exam  Mental status: The patient is alert and oriented x 3 at the time of the examination. The patient has apparent normal recent and remote memory, with an apparently normal attention span and concentration ability.  Mini-Mental status examination done today shows a total score 29/30.   Cranial nerves: Facial symmetry is present. Speech is normal, no aphasia or dysarthria is noted. Extraocular movements are full. Visual fields are full.  Masking  of the face is seen.  Motor: The patient has good strength in all 4 extremities.  Sensory examination: Soft touch sensation is symmetric on the face, arms, and legs.  Coordination: The patient has good finger-nose-finger and heel-to-shin bilaterally.  Gait and station: The patient has the ability to arise from a seated position with arms crossed with some difficulty.  Tandem gait was not attempted.  With walking, he has a stooped posture, slowness of movement, decreased arm swing is seen.  Romberg is negative. No drift is seen.  Reflexes: Deep tendon reflexes are symmetric.   Assessment/Plan:  1.  Parkinson's disease  2.  Gait disorder  3.  Memory disorder  The patient is having excessive daytime drowsiness on the medication, we may switch to the Sinemet CR taking the 50/200 mg tablets 1 tablet 3 times daily.  The wife will call our office depending upon the response to the medication.  The patient will stop the Sinemet IR tablet.  The patient will continue the Aricept for memory.  He will follow-up in about 5 months.  The patient is remaining active with the [employer].  The patient resides at [facility]  Jill Alexanders MD 05/02/2017 12:24 PM  [facility] [address] [place], [place] [address]  Phone [phone] Fax [phone]

## 2017-05-02 NOTE — Patient Instructions (Signed)
   Stop the Sinemet IR 25/100 mg tablets. Start Sinemet 50/200 mg tablets three times a day.

## 2017-05-05 DIAGNOSIS — N183 Chronic kidney disease, stage 3 (moderate): Secondary | ICD-10-CM | POA: Diagnosis not present

## 2017-05-05 DIAGNOSIS — R627 Adult failure to thrive: Secondary | ICD-10-CM | POA: Diagnosis not present

## 2017-05-05 DIAGNOSIS — R4181 Age-related cognitive decline: Secondary | ICD-10-CM | POA: Diagnosis not present

## 2017-05-05 DIAGNOSIS — G20 Parkinson's disease: Secondary | ICD-10-CM | POA: Diagnosis not present

## 2017-05-05 DIAGNOSIS — I208 Other forms of angina pectoris: Secondary | ICD-10-CM | POA: Diagnosis not present

## 2017-05-05 DIAGNOSIS — K5090 Crohn's disease, unspecified, without complications: Secondary | ICD-10-CM | POA: Diagnosis not present

## 2017-05-05 DIAGNOSIS — I509 Heart failure, unspecified: Secondary | ICD-10-CM | POA: Diagnosis not present

## 2017-05-05 DIAGNOSIS — Z6824 Body mass index (BMI) 24.0-24.9, adult: Secondary | ICD-10-CM | POA: Diagnosis not present

## 2017-05-05 DIAGNOSIS — R2689 Other abnormalities of gait and mobility: Secondary | ICD-10-CM | POA: Diagnosis not present

## 2017-05-05 DIAGNOSIS — E298 Other testicular dysfunction: Secondary | ICD-10-CM | POA: Diagnosis not present

## 2017-05-05 DIAGNOSIS — I130 Hypertensive heart and chronic kidney disease with heart failure and stage 1 through stage 4 chronic kidney disease, or unspecified chronic kidney disease: Secondary | ICD-10-CM | POA: Diagnosis not present

## 2017-05-05 DIAGNOSIS — R338 Other retention of urine: Secondary | ICD-10-CM | POA: Diagnosis not present

## 2017-07-07 ENCOUNTER — Other Ambulatory Visit: Payer: Self-pay | Admitting: Cardiology

## 2017-07-07 MED ORDER — NITROGLYCERIN 0.4 MG SL SUBL: 0.4000 mg | SUBLINGUAL_TABLET | SUBLINGUAL | 0 refills | Status: AC | PRN

## 2017-07-07 NOTE — Telephone Encounter (Signed)
°*  STAT* If patient is at the pharmacy, call can be transferred to refill team.   1. Which medications need to be refilled? (please list name of each medication and dose if known) Nitro Glycerin   2. Which pharmacy/location (including street and city if local pharmacy) is medication to be sent to?[place]   3. Do they need a 30 day or 90 day supply?[place]

## 2017-07-09 ENCOUNTER — Telehealth: Payer: Self-pay | Admitting: Cardiology

## 2017-07-09 NOTE — Telephone Encounter (Signed)
New message  Pt c/o Shortness Of Breath: STAT if SOB developed within the last 24 hours or pt is noticeably SOB on the phone  1. Are you currently SOB (can you hear that pt is SOB on the phone)? Wife calling   2. How long have you been experiencing SOB? Last few days , seems to be getting worse   3. Are you SOB when sitting or when up moving around?  Just when up and moving   4. Are you currently experiencing any other symptoms?  Patient is also having swelling in feet , not gaining any weight

## 2017-07-09 NOTE — Telephone Encounter (Signed)
Spoke with pt wife, [name] [name], okay per DPR. Pt having swelling in feet, has been going on for a few days and it goes away over night. Pt elevates feet when sitting in recliner. Recommended he get compression socks to help with this.  She stated he is SOB with exersion it was noticed when they went on a walk to a friends apartment last night after dinner. She stated it was further then they generally walk but he had to stop a few times because he got winded. She stated he is resting fine a night and it only happens when he is up moving around a lot. Encourage her to keep an eye on it and let us know if things get worse.  She takes pat BP regularly and it has remained stable over the past few days.  Monday: 108/68 Tuesday: 121/56 Wednesday: 105/59 HR 89 Let he know I will send to Dr. [name] to review and we will be in contact if there are additional changes needed. She verbalized understanding, no additional questions at this time.

## 2017-07-09 NOTE — Telephone Encounter (Signed)
I would increase demadex to 40 mg daily for three days then resume prior dose.  Brynda Heick [name] MD, [facility]

## 2017-07-10 NOTE — Telephone Encounter (Signed)
Returned call to patient Dr.Jordan's recommendation given.Advised to call back if swelling continues.

## 2017-07-31 ENCOUNTER — Encounter: Payer: Self-pay | Admitting: Cardiology

## 2017-08-12 NOTE — Progress Notes (Signed)
Cardiology Office Note Date:  08/14/2017  Patient ID:  [name] [name], [name] [name] 16, 1930, MRN [id number] PCP:  [name] Baton, MD  Cardiologist:  Dr. [name]   Chief Complaint: dyspnea  History of Present Illness: [name] [name] is a 84 y.o. male with history of CAD (s/p anterior STEMI 04/2007 with BMS-> LAD, patent 12/2011, low risk nuc 04/2014), chronic dCHF, HTN, HLD, Crohn's disease, GERD, Parkinson's disease, cellulitis, orthostatic hypotension requiring midodrine, GERD, RBBB, fatty liver, gait disorder, chronic lower extremity edema who presents for follow up.   He is seen with his wife today. He reports increased dyspnea over the past 6 weeks. He denies any cough, orthopnea, PND, increased swelling, or weight gain. No chest pain. He is quite inactive. Does water exercise once a week. He is still adding salt to food. He is scheduled for follow up lab work with Dr. Virgina Jock at the end of the month.   Past Medical History:  Diagnosis Date  . Cellulitis   . Chronic diastolic CHF (congestive heart failure) (HCC)    a. EF initially 35-40% after MI 1/09; b. echo 7/08: EF 60%;  c. 11/2014 Echo: EF 65-70%, Gr 1 DD, mild MR.  . Coronary artery disease    a. s/p anterior STEMI 04/2007 with BMS-> LAD;  b. Cath 12/2011 patent stent;  c. low risk nuc 04/2014.  . Crohn's disease ([place])   . Diaphragmatic hernia without mention of obstruction or gangrene   . Diverticulosis of colon (without mention of hemorrhage)   . Fatty liver    a. on ultrasound of 10/2009  . Flatulence, eructation, and gas pain   . Fungal infection   . Gait disorder   . GERD (gastroesophageal reflux disease)   . HOH (hard of hearing)    Hearing aids  . Hyperlipidemia   . Hypertension   . Ischemic cardiomyopathy    a. EF initially 35-40% after MI 1/09; b. echo 7/08: EF 60%;  c. 11/2014 Echo: EF 65-70%, Gr 1 DD, mild MR.  . Melanoma ([place])   . Memory disorder   . Orthostatic hypotension   . Osteoporosis   . Other chronic nonalcoholic  liver disease   . Other esophagitis   . Parkinson's disease ([place])   . RBBB 09/02/2013  . Regional enteritis of small intestine ([place])   . Renal calculi   . Restless leg syndrome 03/22/2015  . Ulcerative (chronic) ileocolitis ([place])   . Urinary incontinence     Past Surgical History:  Procedure Laterality Date  . APPENDECTOMY    . BACK SURGERY     L3, L4, L5  . CARDIAC CATHETERIZATION  09/25/06   EF 35-40% but more recently 60%  . CATARACT EXTRACTION     Bilateral  . CHOLECYSTECTOMY    . Coronary artery stent placement    . Melanoma resection    . Partial bowel resection    . TONSILLECTOMY      Current Outpatient Medications  Medication Sig Dispense Refill  . acetaminophen (TYLENOL) 325 MG tablet Take 650 mg by mouth every 6 (six) hours as needed for pain.    Marland Kitchen ADVAIR DISKUS 250-50 MCG/DOSE AEPB Inhale 1 puff into the lungs daily as needed. For shortness of breath    . aspirin EC 81 MG tablet Take 81 mg by mouth daily.    Marland Kitchen atorvastatin (LIPITOR) 40 MG tablet Take 40 mg by mouth daily.    . calcium carbonate (CALCIUM 600) 600 MG TABS tablet Take 600 mg by  mouth daily with breakfast.    . carbidopa-levodopa (SINEMET CR) 50-200 MG tablet Take 1 tablet by mouth 3 (three) times daily. 90 tablet 3  . carvedilol (COREG) 3.125 MG tablet Take 3.125 mg by mouth daily.     . Cholecalciferol (VITAMIN D-3) 1000 UNITS CAPS Take 1,000 Units by mouth daily.    . cholestyramine (QUESTRAN) 4 g packet TAKE 1 PACKET BY MOUTH TWICE DAILY WITH A MEAL 60 each 11  . donepezil (ARICEPT) 10 MG tablet TAKE ONE TABLET AT BEDTIME 90 tablet 3  . ipratropium (ATROVENT) 0.03 % nasal spray Place 2 sprays into both nostrils every 12 (twelve) hours.    Marland Kitchen loperamide (IMODIUM) 2 MG capsule Take 2 mg by mouth 2 (two) times daily.    . midodrine (PROAMATINE) 5 MG tablet Take 1 tablet (5 mg total) by mouth 2 (two) times daily with a meal. 60 tablet 9  . Multiple Vitamins-Minerals (CENTRUM SILVER PO) Take 1 tablet by  mouth daily.    . nitroGLYCERIN (NITROSTAT) 0.4 MG SL tablet Place 1 tablet (0.4 mg total) under the tongue every 5 (five) minutes as needed for chest pain. PT OVERDUE FOR OV PLEASE CALL FOR APPT 25 tablet 0  . pantoprazole (PROTONIX) 40 MG tablet Take 40 mg by mouth daily.     . potassium chloride SA (K-DUR,KLOR-CON) 20 MEQ tablet Take 40 mEq by mouth daily.    Marland Kitchen torsemide (DEMADEX) 20 MG tablet Take 1 tablet (20 mg total) by mouth daily. 180 tablet 3  . vitamin B-12 (CYANOCOBALAMIN) 1000 MCG tablet Take 1,000 mcg by mouth daily.     No current facility-administered medications for this visit.     Allergies:   Patient has no known allergies.   Social History:  The patient  reports that he has never smoked. He has never used smokeless tobacco. He reports that he drinks about 1.2 - 1.8 oz of alcohol per week. He reports that he does not use drugs.   Family History:  The patient's family history includes Colon cancer in his brother; Coronary artery disease in his unknown relative; Emphysema in his father; Heart attack in his mother; Heart disease in his brother and brother; Heart failure in his mother; Hypertension in his mother.  ROS:  Please see the history of present illness.   All other systems are reviewed and otherwise negative.   PHYSICAL EXAM:  VS:  BP 120/72   Pulse 87   Ht 5' 9"  (1.753 m)   Wt 168 lb 6.4 oz (76.4 kg)   BMI 24.87 kg/m  BMI: Body mass index is 24.87 kg/m. O2 sat 93% GENERAL:  Well appearing, elderly WM in NAD HEENT:  PERRL, EOMI, sclera are clear. Oropharynx is clear. NECK:  No jugular venous distention, carotid upstroke brisk and symmetric, no bruits, no thyromegaly or adenopathy LUNGS:  Clear to auscultation bilaterally CHEST:  Unremarkable HEART:  RRR,  PMI not displaced or sustained,S1 and S2 within normal limits, no S3, no S4: no clicks, no rubs, no murmurs ABD:  Soft, nontender. Distended. BS +, no masses or bruits. No hepatomegaly, no splenomegaly EXT:   2 + pulses throughout, no edema, no cyanosis no clubbing SKIN:  Warm and dry.  No rashes NEURO:  Alert and oriented x 3. Cranial nerves II through XII intact. PSYCH:  Cognitively intact  EKG:  Not is  done today. NSR rate 87. RBBB. No acute change. I have personally reviewed and interpreted this study   Recent Labs:  No results found for requested labs within last 8760 hours.  No results found for requested labs within last 8760 hours.   CrCl cannot be calculated (Patient's most recent lab result is older than the maximum 21 days allowed.).   Labs dated 09/03/16 cholesterol 132, triglycerides 113, LDL 47, HDL 62. LFTs and TSH normal  A1c 5.9%,  BUN 28, creatinine 1.2. PSA 2.33.  Wt Readings from Last 3 Encounters:  08/14/17 168 lb 6.4 oz (76.4 kg)  05/02/17 168 lb 8 oz (76.4 kg)  03/21/17 171 lb (77.6 kg)     Other studies reviewed: Additional studies/records reviewed today include:  Echo 11/14/15: Study Conclusions  - Left ventricle: The cavity size was normal. There was mild focal   basal and mild concentric hypertrophy of the septum. Systolic   function was vigorous. The estimated ejection fraction was in the   range of 65% to 70%. Wall motion was normal; there were no   regional wall motion abnormalities. Doppler parameters are   consistent with abnormal left ventricular relaxation (grade 1   diastolic dysfunction). - Mitral valve: There was mild regurgitation.  Impressions:  - Compared to the prior study, there has been no significant   interval change.  ASSESSMENT AND PLAN:  1. Chronic diastolic CHF - although he complains of increased dyspnea I really do not see evidence of volume overload today. Will check a CXR. I would not increase diuretic therapy at this point without weight gain or swelling. I suspect some of his dyspnea may be deconditioning. Will await results of lab work later this month. 2. History of essential HTN with orthostatic hypotension. BP well  controlled on current regimen and he has no orthostatic symptoms 3. CAD s/p anterior MI 2009 with BMS to LAD. Cath 2013 showed patent stent. Normal Myoview Jan 2016.  Asymptomatic. 4. Hyponatremia  5.   Parkinson's disease 6.   Chron's disease.  Disposition: F/u in 6 months  Current medicines are reviewed at length with the patient today.  The patient did not have any concerns regarding medicines.  Signed, Drezden Seitzinger [name] MD, [facility]    08/14/2017 10:43 AM     [facility]

## 2017-08-14 ENCOUNTER — Other Ambulatory Visit: Payer: Self-pay | Admitting: Neurology

## 2017-08-14 ENCOUNTER — Ambulatory Visit (INDEPENDENT_AMBULATORY_CARE_PROVIDER_SITE_OTHER): Payer: PPO | Admitting: Cardiology

## 2017-08-14 ENCOUNTER — Encounter

## 2017-08-14 ENCOUNTER — Ambulatory Visit
Admission: RE | Admit: 2017-08-14 | Discharge: 2017-08-14 | Disposition: A | Discharge status: Home / Self Care | Payer: PPO | Source: Ambulatory Visit | Attending: Cardiology | Admitting: Cardiology

## 2017-08-14 ENCOUNTER — Encounter: Payer: Self-pay | Admitting: Cardiology

## 2017-08-14 VITALS — BP 120/72 | HR 87 | Ht 69.0 in | Wt 168.4 lb

## 2017-08-14 DIAGNOSIS — I951 Orthostatic hypotension: Secondary | ICD-10-CM

## 2017-08-14 DIAGNOSIS — I10 Essential (primary) hypertension: Secondary | ICD-10-CM

## 2017-08-14 DIAGNOSIS — R0609 Other forms of dyspnea: Secondary | ICD-10-CM

## 2017-08-14 DIAGNOSIS — I5032 Chronic diastolic (congestive) heart failure: Secondary | ICD-10-CM

## 2017-08-14 DIAGNOSIS — I2510 Atherosclerotic heart disease of native coronary artery without angina pectoris: Secondary | ICD-10-CM

## 2017-08-14 NOTE — Patient Instructions (Signed)
We will check a chest X ray.  Continue your current therapy  Follow up lab work with Dr. Virgina Jock

## 2017-08-18 ENCOUNTER — Other Ambulatory Visit: Payer: Self-pay | Admitting: Neurology

## 2017-08-25 DIAGNOSIS — E559 Vitamin D deficiency, unspecified: Secondary | ICD-10-CM | POA: Diagnosis not present

## 2017-08-25 DIAGNOSIS — E7849 Other hyperlipidemia: Secondary | ICD-10-CM | POA: Diagnosis not present

## 2017-08-25 DIAGNOSIS — Z125 Encounter for screening for malignant neoplasm of prostate: Secondary | ICD-10-CM | POA: Diagnosis not present

## 2017-08-25 DIAGNOSIS — R7309 Other abnormal glucose: Secondary | ICD-10-CM | POA: Diagnosis not present

## 2017-09-02 ENCOUNTER — Emergency Department (HOSPITAL_COMMUNITY)
Admission: EM | Admit: 2017-09-02 | Discharge: 2017-09-02 | Disposition: A | Discharge status: Home / Self Care | Payer: PPO | Attending: Emergency Medicine | Admitting: Emergency Medicine

## 2017-09-02 ENCOUNTER — Emergency Department (HOSPITAL_COMMUNITY): Payer: PPO

## 2017-09-02 ENCOUNTER — Emergency Department (HOSPITAL_BASED_OUTPATIENT_CLINIC_OR_DEPARTMENT_OTHER): Admit: 2017-09-02 | Discharge: 2017-09-02 | Disposition: A | Discharge status: Home / Self Care | Payer: PPO

## 2017-09-02 ENCOUNTER — Other Ambulatory Visit: Payer: Self-pay

## 2017-09-02 ENCOUNTER — Encounter (HOSPITAL_COMMUNITY): Payer: Self-pay

## 2017-09-02 DIAGNOSIS — L03116 Cellulitis of left lower limb: Secondary | ICD-10-CM | POA: Diagnosis not present

## 2017-09-02 DIAGNOSIS — R600 Localized edema: Secondary | ICD-10-CM | POA: Diagnosis not present

## 2017-09-02 DIAGNOSIS — Z79899 Other long term (current) drug therapy: Secondary | ICD-10-CM | POA: Insufficient documentation

## 2017-09-02 DIAGNOSIS — I5032 Chronic diastolic (congestive) heart failure: Secondary | ICD-10-CM | POA: Diagnosis not present

## 2017-09-02 DIAGNOSIS — I110 Hypertensive heart disease with heart failure: Secondary | ICD-10-CM | POA: Diagnosis not present

## 2017-09-02 DIAGNOSIS — M7989 Other specified soft tissue disorders: Secondary | ICD-10-CM | POA: Diagnosis not present

## 2017-09-02 DIAGNOSIS — R2242 Localized swelling, mass and lump, left lower limb: Secondary | ICD-10-CM | POA: Diagnosis present

## 2017-09-02 DIAGNOSIS — R609 Edema, unspecified: Secondary | ICD-10-CM

## 2017-09-02 DIAGNOSIS — M25572 Pain in left ankle and joints of left foot: Secondary | ICD-10-CM | POA: Diagnosis not present

## 2017-09-02 LAB — CBC WITH DIFFERENTIAL/PLATELET
Basophils Absolute: 0 10*3/uL (ref 0.0–0.1)
Basophils Relative: 0 %
Eosinophils Absolute: 0.1 10*3/uL (ref 0.0–0.7)
Eosinophils Relative: 1 %
HCT: 36.8 % — ABNORMAL LOW (ref 39.0–52.0)
Hemoglobin: 12.4 g/dL — ABNORMAL LOW (ref 13.0–17.0)
Lymphocytes Relative: 31 %
Lymphs Abs: 2.7 10*3/uL (ref 0.7–4.0)
MCH: 31.6 pg (ref 26.0–34.0)
MCHC: 33.7 g/dL (ref 30.0–36.0)
MCV: 93.6 fL (ref 78.0–100.0)
Monocytes Absolute: 0.7 10*3/uL (ref 0.1–1.0)
Monocytes Relative: 8 %
Neutro Abs: 5.3 10*3/uL (ref 1.7–7.7)
Neutrophils Relative %: 60 %
Platelets: 220 10*3/uL (ref 150–400)
RBC: 3.93 MIL/uL — ABNORMAL LOW (ref 4.22–5.81)
RDW: 13.9 % (ref 11.5–15.5)
WBC: 8.8 10*3/uL (ref 4.0–10.5)

## 2017-09-02 LAB — URINALYSIS, ROUTINE W REFLEX MICROSCOPIC
Bacteria, UA: NONE SEEN
Bilirubin Urine: NEGATIVE
Glucose, UA: NEGATIVE mg/dL
Hgb urine dipstick: NEGATIVE
Ketones, ur: NEGATIVE mg/dL
Leukocytes, UA: NEGATIVE
Nitrite: NEGATIVE
Protein, ur: NEGATIVE mg/dL
Specific Gravity, Urine: 1.009 (ref 1.005–1.030)
pH: 5 (ref 5.0–8.0)

## 2017-09-02 LAB — COMPREHENSIVE METABOLIC PANEL
ALT: 14 U/L — ABNORMAL LOW (ref 17–63)
AST: 27 U/L (ref 15–41)
Albumin: 4.1 g/dL (ref 3.5–5.0)
Alkaline Phosphatase: 70 U/L (ref 38–126)
Anion gap: 14 (ref 5–15)
BUN: 21 mg/dL — ABNORMAL HIGH (ref 6–20)
CO2: 25 mmol/L (ref 22–32)
Calcium: 8.4 mg/dL — ABNORMAL LOW (ref 8.9–10.3)
Chloride: 101 mmol/L (ref 101–111)
Creatinine, Ser: 1.19 mg/dL (ref 0.61–1.24)
GFR calc Af Amer: >60 mL/min (ref >60)
GFR calc non Af Amer: 53 mL/min — ABNORMAL LOW (ref >60)
Glucose, Bld: 130 mg/dL — ABNORMAL HIGH (ref 65–99)
Potassium: 3.2 mmol/L — ABNORMAL LOW (ref 3.5–5.1)
Sodium: 140 mmol/L (ref 135–145)
Total Bilirubin: 1.8 mg/dL — ABNORMAL HIGH (ref 0.3–1.2)
Total Protein: 7.8 g/dL (ref 6.5–8.1)

## 2017-09-02 MED ORDER — DOXYCYCLINE HYCLATE 100 MG PO CAPS: 100.0000 mg | ORAL_CAPSULE | Freq: Two times a day (BID) | ORAL | 0 refills | Status: AC

## 2017-09-02 MED ORDER — DOXYCYCLINE HYCLATE 100 MG PO TABS
100.0000 mg | ORAL_TABLET | Freq: Once | ORAL | Status: AC
  Administered 2017-09-02: 100 mg via ORAL
  Filled 2017-09-02: qty 1

## 2017-09-02 NOTE — Discharge Instructions (Addendum)
Your DVT U/S was negative. Your x-ray and CT were negative for a fracture. You have cellulitis of your left leg. Take the antibiotics as directed. Follow up with your primary care provider for recheck in 24hrs. Return to ED for worsening pain, swelling, expanding erythema especially if it streaks away from the affected area, fever.

## 2017-09-02 NOTE — ED Notes (Signed)
Returned from xray

## 2017-09-02 NOTE — ED Notes (Signed)
Patient transported to CT 

## 2017-09-02 NOTE — ED Provider Notes (Signed)
[facility] DEPT Provider Note   CSN: [id number] Arrival date & time: 09/02/17  1620     History   Chief Complaint No chief complaint on file.   HPI [name] [name] is a 84 y.o. male with past medical history of CHF, CAD, Crohn's disease, Crohn's disease, who presents to ED for evaluation of 3-day history of left lower extremity edema, erythema and ankle swelling.  His wife noticed it this morning after his physical therapy class.  He states that it is "sometimes" painful.  He agrees or falls.  No prior history of DVT or PE.  Denies any recent surgeries, recent prolonged travel, hormone use, fever, chest pain, hemoptysis.  Wife states that he had a history of "really bad fungus infection" to his bilateral arms which was a possible side effect to the Remicade he was taking for his Crohn's disease.  HPI  Past Medical History:  Diagnosis Date  . Cellulitis   . Chronic diastolic CHF (congestive heart failure) (HCC)    a. EF initially 35-40% after MI 1/09; b. echo 7/08: EF 60%;  c. 11/2014 Echo: EF 65-70%, Gr 1 DD, mild MR.  . Coronary artery disease    a. s/p anterior STEMI 04/2007 with BMS-> LAD;  b. Cath 12/2011 patent stent;  c. low risk nuc 04/2014.  . Crohn's disease ([place])   . Diaphragmatic hernia without mention of obstruction or gangrene   . Diverticulosis of colon (without mention of hemorrhage)   . Fatty liver    a. on ultrasound of 10/2009  . Flatulence, eructation, and gas pain   . Fungal infection   . Gait disorder   . GERD (gastroesophageal reflux disease)   . HOH (hard of hearing)    Hearing aids  . Hyperlipidemia   . Hypertension   . Ischemic cardiomyopathy    a. EF initially 35-40% after MI 1/09; b. echo 7/08: EF 60%;  c. 11/2014 Echo: EF 65-70%, Gr 1 DD, mild MR.  . Melanoma ([place])   . Memory disorder   . Orthostatic hypotension   . Osteoporosis   . Other chronic nonalcoholic liver disease   . Other esophagitis   . Parkinson's disease  ([place])   . RBBB 09/02/2013  . Regional enteritis of small intestine ([place])   . Renal calculi   . Restless leg syndrome 03/22/2015  . 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 Urinary incontinence     Patient Active Problem List   Diagnosis Date Noted  . Restless leg syndrome 03/22/2015  . Parkinson disease ([place]) 12/30/2014  . Alternaria infection ([place]) 12/29/2014  . Ischemic cardiomyopathy   . Coronary artery disease   . Chronic diastolic CHF (congestive heart failure) ([place])   . NASH (nonalcoholic steatohepatitis) 11/20/2014  . Peripheral edema   . Dizziness 11/11/2014  . Pressure ulcer 11/11/2014  . Orthostatic hypotension   . Dyspnea on exertion 04/22/2014  . Dehydration 09/28/2012  . Acute kidney injury ([place]) 09/28/2012  . Memory loss 08/19/2012  . Paralysis agitans ([place]) 08/19/2012  . Abnormality of gait 08/19/2012  . Dysphagia 05/24/2011  . Tinea corporis 01/04/2011  . Angina of effort ([place]) 12/14/2010  . Fatigue 12/14/2010  . NOSEBLEED 05/23/2010  . Hyperlipidemia 04/29/2007  . Essential hypertension 04/29/2007  . GERD 04/29/2007  . Crohn's disease ([place]) 04/29/2007  . SMALL BOWEL OBSTRUCTION 04/29/2007  . DIVERTICULOSIS, COLON 04/29/2007  . FATTY LIVER DISEASE 08/30/2006  . HEMORRHOIDS 03/26/2005  . Erosive esophagitis 09/29/1997  . HIATAL HERNIA 09/29/1997  Past Surgical History:  Procedure Laterality Date  . APPENDECTOMY    . BACK SURGERY     L3, L4, L5  . CARDIAC CATHETERIZATION  09/25/06   EF 35-40% but more recently 60%  . CATARACT EXTRACTION     Bilateral  . CHOLECYSTECTOMY    . Coronary artery stent placement    . Melanoma resection    . Partial bowel resection    . TONSILLECTOMY          Home Medications    Prior to Admission medications   Medication Sig Start Date End Date Taking? Authorizing Provider  acetaminophen (TYLENOL) 325 MG tablet Take 650 mg by mouth every 6 (six) hours as needed for pain.   Yes [provider]    ADVAIR DISKUS 250-50 MCG/DOSE AEPB Inhale 1 puff into the lungs daily as needed. For shortness of breath 01/24/14  Yes [provider]  aspirin EC 81 MG tablet Take 81 mg by mouth daily.   Yes [provider]  atorvastatin (LIPITOR) 40 MG tablet Take 40 mg by mouth daily.   Yes [provider]  calcium carbonate (CALCIUM 600) 600 MG TABS tablet Take 600 mg by mouth daily with breakfast.   Yes [provider]  carbidopa-levodopa (SINEMET CR) 50-200 MG tablet TAKE ONE TABLET THREE TIMES DAILY 08/18/17  Yes [name], MD  carvedilol (COREG) 3.125 MG tablet Take 3.125 mg by mouth daily.    Yes [provider]  Cholecalciferol (VITAMIN D-3) 1000 UNITS CAPS Take 1,000 Units by mouth daily.   Yes [provider]  cholestyramine (QUESTRAN) 4 g packet TAKE 1 PACKET BY MOUTH TWICE DAILY WITH A MEAL 01/16/17  Yes [name], MD  donepezil (ARICEPT) 10 MG tablet TAKE ONE TABLET AT BEDTIME 09/25/16  Yes [name], MD  midodrine (PROAMATINE) 5 MG tablet Take 1 tablet (5 mg total) by mouth 2 (two) times daily with a meal. 11/23/14  Yes Dunn, Dayna N, PA-C  Multiple Vitamins-Minerals (CENTRUM SILVER PO) Take 1 tablet by mouth daily.   Yes [provider]  nitroGLYCERIN (NITROSTAT) 0.4 MG SL tablet Place 1 tablet (0.4 mg total) under the tongue every 5 (five) minutes as needed for chest pain. PT OVERDUE FOR OV PLEASE CALL FOR APPT 07/07/17  Yes [name], [name], MD  pantoprazole (PROTONIX) 40 MG tablet Take 40 mg by mouth daily.  12/30/14  Yes [provider]  potassium chloride SA (K-DUR,KLOR-CON) 20 MEQ tablet Take 40 mEq by mouth daily.   Yes [provider]  torsemide (DEMADEX) 20 MG tablet Take 1 tablet (20 mg total) by mouth daily. 11/23/14  Yes Dunn, Dayna N, PA-C  vitamin B-12 (CYANOCOBALAMIN) 1000 MCG tablet Take 1,000 mcg by mouth daily.   Yes [provider]  doxycycline (VIBRAMYCIN) 100 MG capsule  Take 1 capsule (100 mg total) by mouth 2 (two) times daily for 7 days. 09/02/17 09/09/17  Delia Heady, PA-C    Family History Family History  Problem Relation Age of Onset  . Heart failure Mother   . Heart attack Mother   . Hypertension Mother   . Emphysema Father   . Heart disease Brother   . Heart disease Brother   . Coronary artery disease Unknown   . Colon cancer Brother        older at dx  . Stomach cancer Neg Hx     Social History Social History   Tobacco Use  . Smoking status: Never  Smoker  . Smokeless tobacco: Never Used  Substance Use Topics  . Alcohol use: Yes    Alcohol/week: 1.2 - 1.8 oz    Types: 2 - 3 Standard drinks or equivalent per week    Comment: occ   . Drug use: No     Allergies   Patient has no known allergies.   Review of Systems Review of Systems  Constitutional: Negative for appetite change, chills and fever.  HENT: Negative for ear pain, rhinorrhea, sneezing and sore throat.   Eyes: Negative for photophobia and visual disturbance.  Respiratory: Negative for cough, chest tightness, shortness of breath and wheezing.   Cardiovascular: Positive for leg swelling. Negative for chest pain and palpitations.  Gastrointestinal: Negative for abdominal pain, blood in stool, constipation, diarrhea, nausea and vomiting.  Genitourinary: Negative for dysuria, hematuria and urgency.  Musculoskeletal: Negative for myalgias.  Skin: Positive for color change. Negative for rash.  Neurological: Negative for dizziness, weakness and light-headedness.     Physical Exam Updated Vital Signs BP 132/84 (BP Location: Left Arm)   Pulse 86   Temp 98.1 F (36.7 C) (Oral)   Resp 20   SpO2 95%   Physical Exam  Constitutional: He appears well-developed and well-nourished. No distress.  Nontoxic appearing and in no acute distress.  HENT:  Head: Normocephalic and atraumatic.  Nose: Nose normal.  Eyes: Conjunctivae and EOM are normal. Left eye exhibits no  discharge. No scleral icterus.  Neck: Normal range of motion. Neck supple.  Cardiovascular: Normal rate, regular rhythm, normal heart sounds and intact distal pulses. Exam reveals no gallop and no friction rub.  No murmur heard. Pulmonary/Chest: Effort normal and breath sounds normal. No respiratory distress.  Abdominal: Soft. Bowel sounds are normal. He exhibits no distension. There is no tenderness. There is no guarding.  Musculoskeletal: Normal range of motion. He exhibits edema and tenderness.  Left lower extremity erythema, 1+ pitting edema noted. TTP of L ankle. Full active and passive ROM of ankle and knee. No calf tenderness bilaterally. Normal sensation to BLE. 2+ DP pulses bilaterally. Both lower extremities are warm to touch.  Neurological: He is alert. He exhibits normal muscle tone. Coordination normal.  Skin: Skin is warm and dry. No rash noted. There is erythema.  Psychiatric: He has a normal mood and affect.  Nursing note and vitals reviewed.      ED Treatments / Results  Labs (all labs ordered are listed, but only abnormal results are displayed) Labs Reviewed  COMPREHENSIVE METABOLIC PANEL - Abnormal; Notable for the following components:      Result Value   Potassium 3.2 (*)    Glucose, Bld 130 (*)    BUN 21 (*)    Calcium 8.4 (*)    ALT 14 (*)    Total Bilirubin 1.8 (*)    GFR calc non Af Amer 53 (*)    All other components within normal limits  CBC WITH DIFFERENTIAL/PLATELET - Abnormal; Notable for the following components:   RBC 3.93 (*)    Hemoglobin 12.4 (*)    HCT 36.8 (*)    All other components within normal limits  URINALYSIS, ROUTINE W REFLEX MICROSCOPIC    EKG None  Radiology  *Preliminary Results* Left lower extremity venous duplex completed. Left lower extremity is negative for deep vein thrombosis. There is no evidence of left Baker's cyst.  09/02/2017 8:08 PM  Maudry Mayhew, BS, RVT, RDCS, RDMS   Final Interpretation: Left:  There is no evidence of deep  vein thrombosis in the lower extremity. No cystic structure found in the popliteal fossa.  Dg Ankle Complete Left  Result Date: 09/02/2017 CLINICAL DATA:  LEFT ankle pain and swelling with redness for 2 days. No injury. EXAM: LEFT ANKLE COMPLETE - 3+ VIEW COMPARISON:  None. FINDINGS: Faint linear lucency through distal fibula on lateral radiograph. No dislocation. The ankle mortise appears congruent and the tibiofibular syndesmosis intact. No destructive bony lesions. Small plantar calcaneal spur. Lateral greater than medial soft tissue swelling without subcutaneous gas or radiopaque foreign bodies. IMPRESSION: Faint nondisplaced distal fibular fracture versus artifact. Soft tissue swelling. Electronically Signed   By: Elon Alas M.D.   On: 09/02/2017 20:06   Ct Tibia Fibula Left Wo Contrast  Result Date: 09/02/2017 CLINICAL DATA:  Assess possible fracture of the distal fibula. Initial encounter. EXAM: CT OF THE LOWER LEFT EXTREMITY WITHOUT CONTRAST TECHNIQUE: Multidetector CT imaging of the lower left extremity was performed according to the standard protocol. COMPARISON:  None. FINDINGS: Bones/Joint/Cartilage No definite fracture is seen. The distal fibula appears intact. Visualized joint spaces are preserved. The ankle mortise is unremarkable in appearance. The knee joint is unremarkable in appearance, aside from mild sclerosis and cortical irregularity at the medial compartment. No osseous erosions are seen. Ligaments Suboptimally assessed by CT. Muscles and Tendons The visualized musculature is grossly unremarkable. There is diffuse atrophy of much of the gastrocnemius and soleus musculature. Visualized tendon structures are grossly unremarkable. The patellar tendon and Achilles tendon appear intact. Soft tissues Diffuse soft tissue edema is noted along much of the left lower leg. There is no evidence of abscess at this time. IMPRESSION: 1. No definite fracture seen.  2. Diffuse soft tissue edema along much of the left lower leg. 3. Diffuse atrophy of much of the gastrocnemius and soleus musculature. 4. Mild degenerative change at the medial compartment of the left knee. Electronically Signed   By: Garald Balding M.D.   On: 09/02/2017 22:58    Procedures Procedures (including critical care time)  Medications Ordered in ED Medications  doxycycline (VIBRA-TABS) tablet 100 mg (100 mg Oral Given 09/02/17 2119)     Initial Impression / Assessment and Plan / ED Course  I have reviewed the triage vital signs and the nursing notes.  Pertinent labs & imaging results that were available during my care of the patient were reviewed by me and considered in my medical decision making (see chart for details).     Patient presents to ED for evaluation of L lower extremity edema, erythema and pain for the past 2 days.  Patient is still able to ambulate.  He boxes as a part of physical therapy but cannot recall any injury to the area.  Denies any history of DVT or PE.  Denies any recent surgeries, recent prolonged travel, fever, numbness in leg.  Physical exam findings as notable in the image above.  Area is neurovascularly intact.  Lower extremities are equally warm to touch.  He is afebrile.  DVT study is negative.  CBC, CMP unremarkable.  Urinalysis unremarkable.  X-ray of the ankle with possible nondisplaced distal fibular fracture versus artifact.  CT of tib/fib shows no fracture. Did show diffuse soft tissue swelling of area with degenerative changes and muscle atrophy. Patient will be treated with dicyclomine for cellulitis.  Encouraged to follow-up for recheck in 24 to 48 hours with primary care provider.  Advised to return to ED for any severe worsening symptoms including worsening pain, swelling, expanding erythema especially if  it streaks away from the affected area, fever. Patient discussed with and seen by my attending, Dr. Regenia Skeeter.  Portions of this note were  generated with [employer]. Dictation errors may occur despite best attempts at proofreading.   Final Clinical Impressions(s) / ED Diagnoses   Final diagnoses:  Cellulitis of left lower extremity    ED Discharge Orders        Ordered    doxycycline (VIBRAMYCIN) 100 MG capsule  2 times daily     09/02/17 2310       Delia Heady, PA-C 09/02/17 2315    [place]wood Gambler, MD 09/02/17 2328

## 2017-09-02 NOTE — Progress Notes (Signed)
*  Preliminary Results* Left lower extremity venous duplex completed. Left lower extremity is negative for deep vein thrombosis. There is no evidence of left Baker's cyst.  09/02/2017 8:08 PM  Maudry Mayhew, BS, RVT, RDCS, RDMS

## 2017-09-02 NOTE — ED Notes (Signed)
Patient transported to X-ray 

## 2017-09-02 NOTE — ED Notes (Signed)
Doppler study complete

## 2017-09-05 DIAGNOSIS — E559 Vitamin D deficiency, unspecified: Secondary | ICD-10-CM | POA: Diagnosis not present

## 2017-09-05 DIAGNOSIS — R82998 Other abnormal findings in urine: Secondary | ICD-10-CM | POA: Diagnosis not present

## 2017-09-05 DIAGNOSIS — R7309 Other abnormal glucose: Secondary | ICD-10-CM | POA: Diagnosis not present

## 2017-09-05 DIAGNOSIS — R600 Localized edema: Secondary | ICD-10-CM | POA: Diagnosis not present

## 2017-09-05 DIAGNOSIS — Z125 Encounter for screening for malignant neoplasm of prostate: Secondary | ICD-10-CM | POA: Diagnosis not present

## 2017-09-05 DIAGNOSIS — L03116 Cellulitis of left lower limb: Secondary | ICD-10-CM | POA: Diagnosis not present

## 2017-09-05 DIAGNOSIS — Z6824 Body mass index (BMI) 24.0-24.9, adult: Secondary | ICD-10-CM | POA: Diagnosis not present

## 2017-09-05 DIAGNOSIS — E7849 Other hyperlipidemia: Secondary | ICD-10-CM | POA: Diagnosis not present

## 2017-09-10 ENCOUNTER — Other Ambulatory Visit: Payer: Self-pay | Admitting: Neurology

## 2017-09-10 DIAGNOSIS — L570 Actinic keratosis: Secondary | ICD-10-CM | POA: Diagnosis not present

## 2017-09-10 DIAGNOSIS — D2261 Melanocytic nevi of right upper limb, including shoulder: Secondary | ICD-10-CM | POA: Diagnosis not present

## 2017-09-10 DIAGNOSIS — L738 Other specified follicular disorders: Secondary | ICD-10-CM | POA: Diagnosis not present

## 2017-09-10 DIAGNOSIS — L814 Other melanin hyperpigmentation: Secondary | ICD-10-CM | POA: Diagnosis not present

## 2017-09-10 DIAGNOSIS — Z85828 Personal history of other malignant neoplasm of skin: Secondary | ICD-10-CM | POA: Diagnosis not present

## 2017-09-10 DIAGNOSIS — Z85820 Personal history of malignant melanoma of skin: Secondary | ICD-10-CM | POA: Diagnosis not present

## 2017-09-10 DIAGNOSIS — L821 Other seborrheic keratosis: Secondary | ICD-10-CM | POA: Diagnosis not present

## 2017-09-12 DIAGNOSIS — N183 Chronic kidney disease, stage 3 (moderate): Secondary | ICD-10-CM | POA: Diagnosis not present

## 2017-09-12 DIAGNOSIS — Z1389 Encounter for screening for other disorder: Secondary | ICD-10-CM | POA: Diagnosis not present

## 2017-09-12 DIAGNOSIS — K5090 Crohn's disease, unspecified, without complications: Secondary | ICD-10-CM | POA: Diagnosis not present

## 2017-09-12 DIAGNOSIS — I130 Hypertensive heart and chronic kidney disease with heart failure and stage 1 through stage 4 chronic kidney disease, or unspecified chronic kidney disease: Secondary | ICD-10-CM | POA: Diagnosis not present

## 2017-09-12 DIAGNOSIS — Z0000 Encounter for general adult medical examination without abnormal findings: Secondary | ICD-10-CM | POA: Diagnosis not present

## 2017-09-12 DIAGNOSIS — R4181 Age-related cognitive decline: Secondary | ICD-10-CM | POA: Diagnosis not present

## 2017-09-12 DIAGNOSIS — G20 Parkinson's disease: Secondary | ICD-10-CM | POA: Diagnosis not present

## 2017-09-12 DIAGNOSIS — Z6825 Body mass index (BMI) 25.0-25.9, adult: Secondary | ICD-10-CM | POA: Diagnosis not present

## 2017-09-12 DIAGNOSIS — I509 Heart failure, unspecified: Secondary | ICD-10-CM | POA: Diagnosis not present

## 2017-09-12 DIAGNOSIS — I208 Other forms of angina pectoris: Secondary | ICD-10-CM | POA: Diagnosis not present

## 2017-09-12 DIAGNOSIS — I2510 Atherosclerotic heart disease of native coronary artery without angina pectoris: Secondary | ICD-10-CM | POA: Diagnosis not present

## 2017-09-12 DIAGNOSIS — R627 Adult failure to thrive: Secondary | ICD-10-CM | POA: Diagnosis not present

## 2017-10-03 ENCOUNTER — Other Ambulatory Visit: Payer: Self-pay

## 2017-10-03 ENCOUNTER — Ambulatory Visit (INDEPENDENT_AMBULATORY_CARE_PROVIDER_SITE_OTHER): Payer: PPO | Admitting: Neurology

## 2017-10-03 ENCOUNTER — Encounter: Payer: Self-pay | Admitting: Neurology

## 2017-10-03 VITALS — BP 147/81 | HR 101 | Ht 67.0 in | Wt 169.0 lb

## 2017-10-03 DIAGNOSIS — R269 Unspecified abnormalities of gait and mobility: Secondary | ICD-10-CM | POA: Diagnosis not present

## 2017-10-03 DIAGNOSIS — R413 Other amnesia: Secondary | ICD-10-CM

## 2017-10-03 DIAGNOSIS — G20 Parkinson's disease: Secondary | ICD-10-CM

## 2017-10-03 MED ORDER — CARBIDOPA-LEVODOPA ER 50-200 MG PO TBCR: 1.0000 | EXTENDED_RELEASE_TABLET | Freq: Four times a day (QID) | ORAL | 5 refills | Status: DC

## 2017-10-03 NOTE — Progress Notes (Signed)
Reason for visit: Parkinson's disease  [name] [name] is an 84 y.o. male  History of present illness:  Mr. Shealy is an 84 year old right-handed white male with a history of Parkinson's disease.  The patient has had increasing problems with getting up out of a chair, freezing episodes, shuffling gait, and slowness of movement.  The patient is on Sinemet CR taking the 50/200 mg tablet 3 times daily.  The patient continues to have significant drowsiness throughout the day, he sleeps a good portion of the day.  He is fatigued even after he gets up out of bed in the morning, and after breakfast.  The patient does not snore at night, he denies any vivid dreams.  The patient takes Aricept at night for a memory problem.  He stays up to about 11 PM.  The patient was in the emergency room on 02 Sep 2017 with cellulitis of the leg.  He has recovered from this.  The patient returns to the office today for an evaluation.  The patient does engage in exercise program such as rock steady boxing, but in between the exercise sessions he is very inactive.  Past Medical History:  Diagnosis Date  . Cellulitis   . Chronic diastolic CHF (congestive heart failure) (HCC)    a. EF initially 35-40% after MI 1/09; b. echo 7/08: EF 60%;  c. 11/2014 Echo: EF 65-70%, Gr 1 DD, mild MR.  . Coronary artery disease    a. s/p anterior STEMI 04/2007 with BMS-> LAD;  b. Cath 12/2011 patent stent;  c. low risk nuc 04/2014.  . Crohn's disease ([place])   . Diaphragmatic hernia without mention of obstruction or gangrene   . Diverticulosis of colon (without mention of hemorrhage)   . Fatty liver    a. on ultrasound of 10/2009  . Flatulence, eructation, and gas pain   . Fungal infection   . Gait disorder   . GERD (gastroesophageal reflux disease)   . HOH (hard of hearing)    Hearing aids  . Hyperlipidemia   . Hypertension   . Ischemic cardiomyopathy    a. EF initially 35-40% after MI 1/09; b. echo 7/08: EF 60%;  c. 11/2014 Echo: EF  65-70%, Gr 1 DD, mild MR.  . Melanoma ([place])   . Memory disorder   . Orthostatic hypotension   . Osteoporosis   . Other chronic nonalcoholic liver disease   . Other esophagitis   . Parkinson's disease ([place])   . RBBB 09/02/2013  . Regional enteritis of small intestine ([place])   . Renal calculi   . Restless leg syndrome 03/22/2015  . 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 Urinary incontinence     Past Surgical History:  Procedure Laterality Date  . APPENDECTOMY    . BACK SURGERY     L3, L4, L5  . CARDIAC CATHETERIZATION  09/25/06   EF 35-40% but more recently 60%  . CATARACT EXTRACTION     Bilateral  . CHOLECYSTECTOMY    . Coronary artery stent placement    . Melanoma resection    . Partial bowel resection    . TONSILLECTOMY      Family History  Problem Relation Age of Onset  . Heart failure Mother   . Heart attack Mother   . Hypertension Mother   . Emphysema Father   . Heart disease Brother   . Heart disease Brother   . Coronary artery disease Unknown   . Colon cancer Brother  older at dx  . Stomach cancer Neg Hx     Social history:  reports that he has never smoked. He has never used smokeless tobacco. He reports that he drinks about 1.2 - 1.8 oz of alcohol per week. He reports that he does not use drugs.   No Known Allergies  Medications:  Prior to Admission medications   Medication Sig Start Date End Date Taking? Authorizing Provider  acetaminophen (TYLENOL) 325 MG tablet Take 650 mg by mouth every 6 (six) hours as needed for pain.   Yes [provider]  ADVAIR DISKUS 250-50 MCG/DOSE AEPB Inhale 1 puff into the lungs daily as needed. For shortness of breath 01/24/14  Yes [provider]  aspirin EC 81 MG tablet Take 81 mg by mouth daily.   Yes [provider]  atorvastatin (LIPITOR) 40 MG tablet Take 40 mg by mouth daily.   Yes [provider]  calcium carbonate (CALCIUM 600) 600 MG TABS tablet Take 600 mg by mouth  daily with breakfast.   Yes [provider]  carbidopa-levodopa (SINEMET CR) 50-200 MG tablet TAKE ONE TABLET THREE TIMES DAILY 08/18/17  Yes [name], MD  carvedilol (COREG) 3.125 MG tablet Take 3.125 mg by mouth daily.    Yes [provider]  Cholecalciferol (VITAMIN D-3) 1000 UNITS CAPS Take 1,000 Units by mouth daily.   Yes [provider]  cholestyramine (QUESTRAN) 4 g packet TAKE 1 PACKET BY MOUTH TWICE DAILY WITH A MEAL 01/16/17  Yes [name], MD  donepezil (ARICEPT) 10 MG tablet TAKE ONE TABLET AT BEDTIME 09/10/17  Yes [name], MD  midodrine (PROAMATINE) 5 MG tablet Take 1 tablet (5 mg total) by mouth 2 (two) times daily with a meal. 11/23/14  Yes Dunn, Dayna N, PA-C  Multiple Vitamins-Minerals (CENTRUM SILVER PO) Take 1 tablet by mouth daily.   Yes [provider]  nitroGLYCERIN (NITROSTAT) 0.4 MG SL tablet Place 1 tablet (0.4 mg total) under the tongue every 5 (five) minutes as needed for chest pain. PT OVERDUE FOR OV PLEASE CALL FOR APPT 07/07/17  Yes [name], [name], MD  pantoprazole (PROTONIX) 40 MG tablet Take 40 mg by mouth daily.  12/30/14  Yes [provider]  potassium chloride SA (K-DUR,KLOR-CON) 20 MEQ tablet Take 40 mEq by mouth daily.   Yes [provider]  torsemide (DEMADEX) 20 MG tablet Take 1 tablet (20 mg total) by mouth daily. 11/23/14  Yes Dunn, Dayna N, PA-C  vitamin B-12 (CYANOCOBALAMIN) 1000 MCG tablet Take 1,000 mcg by mouth daily.   Yes [provider]    ROS:  Out of a complete 14 system review of symptoms, the patient complains only of the following symptoms, and all other reviewed systems are negative.  Fatigue Hearing loss, runny nose, drooling Cough, shortness of breath Leg swelling Diarrhea, incontinence of the bowels Daytime sleepiness Incontinence of the bladder, frequency of urination Joint pain, back pain, walking difficulty, neck pain, neck stiffness Moles Memory  loss, numbness Decreased concentration  Blood pressure (!) 147/81, pulse (!) 101, height 5' 7"  (1.702 m), weight 169 lb (76.7 kg).   Blood pressure, right arm, sitting is 132/64.  Blood pressure, right arm, standing is 138/70.  Physical Exam  General: The patient is alert and cooperative at the time of the examination.  Skin: No significant peripheral edema is noted.   Neurologic Exam  Mental status: The patient is alert and oriented x 3 at the time of  the examination. The Mini-Mental status examination done today shows a total score 25/30.   Cranial nerves: Facial symmetry is present. Speech is normal, no aphasia or dysarthria is noted. Extraocular movements are full. Visual fields are full.  Masking of the face is seen.  Motor: The patient has good strength in all 4 extremities.  Sensory examination: Soft touch sensation is symmetric on the face, arms, and legs.  Coordination: The patient has good finger-nose-finger and heel-to-shin bilaterally.  No tremors are noted.  Gait and station: The patient requires some assistance with standing from a seated position.  Once up, he can walk with a walker, he does have some shuffling of his feet when he first initiates walking.  He has some slowness with turns.  Romberg is negative.  Reflexes: Deep tendon reflexes are symmetric.   Assessment/Plan:  1.  Parkinson's disease  2.  Memory disorder  3.  Gait disorder  4.  Excessive daytime drowsiness  The patient will need to go up on the Sinemet, he is developing increasing symptoms of Parkinson's disease.  The patient has a lot of daytime drowsiness, I have recommended a sleep evaluation, the patient does not wish to pursue this testing at this point.  The patient will need to remain more active, he is to walk daily, try to build up stamina.  I have given a prescription for a rolling walker with a seat and hand brakes.  The patient will follow-up in 5 months.  A prescription was sent in  for the Sinemet.  Jill Alexanders MD 10/03/2017 11:52 AM  [facility] [address] [place], [place] [address]  Phone [phone] Fax [phone]

## 2017-10-03 NOTE — Patient Instructions (Signed)
We will go up on the Sinemet CR 50/ 200 mg 4 times a day.  Try to walk on a regular basis.

## 2017-10-13 DIAGNOSIS — I509 Heart failure, unspecified: Secondary | ICD-10-CM | POA: Diagnosis not present

## 2017-10-13 DIAGNOSIS — Z6825 Body mass index (BMI) 25.0-25.9, adult: Secondary | ICD-10-CM | POA: Diagnosis not present

## 2017-10-13 DIAGNOSIS — R600 Localized edema: Secondary | ICD-10-CM | POA: Diagnosis not present

## 2017-10-13 DIAGNOSIS — I130 Hypertensive heart and chronic kidney disease with heart failure and stage 1 through stage 4 chronic kidney disease, or unspecified chronic kidney disease: Secondary | ICD-10-CM | POA: Diagnosis not present

## 2017-10-13 DIAGNOSIS — G20 Parkinson's disease: Secondary | ICD-10-CM | POA: Diagnosis not present

## 2017-10-13 DIAGNOSIS — L03116 Cellulitis of left lower limb: Secondary | ICD-10-CM | POA: Diagnosis not present

## 2017-10-13 DIAGNOSIS — I2510 Atherosclerotic heart disease of native coronary artery without angina pectoris: Secondary | ICD-10-CM | POA: Diagnosis not present

## 2017-10-13 DIAGNOSIS — I7389 Other specified peripheral vascular diseases: Secondary | ICD-10-CM | POA: Diagnosis not present

## 2017-11-18 DIAGNOSIS — I509 Heart failure, unspecified: Secondary | ICD-10-CM | POA: Diagnosis not present

## 2017-11-18 DIAGNOSIS — L03116 Cellulitis of left lower limb: Secondary | ICD-10-CM | POA: Diagnosis not present

## 2017-11-18 DIAGNOSIS — M545 Low back pain: Secondary | ICD-10-CM | POA: Diagnosis not present

## 2017-11-18 DIAGNOSIS — R600 Localized edema: Secondary | ICD-10-CM | POA: Diagnosis not present

## 2017-11-18 DIAGNOSIS — R319 Hematuria, unspecified: Secondary | ICD-10-CM | POA: Diagnosis not present

## 2017-11-19 ENCOUNTER — Other Ambulatory Visit: Payer: Self-pay | Admitting: Family Medicine

## 2017-11-19 DIAGNOSIS — R319 Hematuria, unspecified: Secondary | ICD-10-CM

## 2017-11-21 ENCOUNTER — Ambulatory Visit
Admission: RE | Admit: 2017-11-21 | Discharge: 2017-11-21 | Disposition: A | Discharge status: Home / Self Care | Payer: PPO | Source: Ambulatory Visit | Attending: Family Medicine | Admitting: Family Medicine

## 2017-11-21 DIAGNOSIS — R319 Hematuria, unspecified: Secondary | ICD-10-CM

## 2017-11-21 DIAGNOSIS — K429 Umbilical hernia without obstruction or gangrene: Secondary | ICD-10-CM | POA: Diagnosis not present

## 2017-11-24 DIAGNOSIS — L03115 Cellulitis of right lower limb: Secondary | ICD-10-CM | POA: Diagnosis not present

## 2017-11-24 DIAGNOSIS — M79609 Pain in unspecified limb: Secondary | ICD-10-CM | POA: Diagnosis not present

## 2017-11-24 DIAGNOSIS — R600 Localized edema: Secondary | ICD-10-CM | POA: Diagnosis not present

## 2017-11-24 DIAGNOSIS — I739 Peripheral vascular disease, unspecified: Secondary | ICD-10-CM | POA: Diagnosis not present

## 2017-11-25 ENCOUNTER — Ambulatory Visit (HOSPITAL_COMMUNITY)
Admission: RE | Admit: 2017-11-25 | Discharge: 2017-11-25 | Disposition: A | Discharge status: Home / Self Care | Payer: PPO | Source: Ambulatory Visit | Attending: Vascular Surgery | Admitting: Vascular Surgery

## 2017-11-25 ENCOUNTER — Other Ambulatory Visit (HOSPITAL_COMMUNITY): Payer: Self-pay | Admitting: Family Medicine

## 2017-11-25 DIAGNOSIS — L03115 Cellulitis of right lower limb: Secondary | ICD-10-CM | POA: Insufficient documentation

## 2017-11-27 DIAGNOSIS — L03115 Cellulitis of right lower limb: Secondary | ICD-10-CM | POA: Diagnosis not present

## 2017-11-27 DIAGNOSIS — L819 Disorder of pigmentation, unspecified: Secondary | ICD-10-CM | POA: Diagnosis not present

## 2017-11-27 DIAGNOSIS — Z23 Encounter for immunization: Secondary | ICD-10-CM | POA: Diagnosis not present

## 2017-11-27 DIAGNOSIS — I7389 Other specified peripheral vascular diseases: Secondary | ICD-10-CM | POA: Diagnosis not present

## 2017-11-27 DIAGNOSIS — I951 Orthostatic hypotension: Secondary | ICD-10-CM | POA: Diagnosis not present

## 2017-11-27 DIAGNOSIS — G20 Parkinson's disease: Secondary | ICD-10-CM | POA: Diagnosis not present

## 2017-11-27 DIAGNOSIS — R600 Localized edema: Secondary | ICD-10-CM | POA: Diagnosis not present

## 2017-11-27 DIAGNOSIS — N183 Chronic kidney disease, stage 3 (moderate): Secondary | ICD-10-CM | POA: Diagnosis not present

## 2017-11-27 DIAGNOSIS — Z6825 Body mass index (BMI) 25.0-25.9, adult: Secondary | ICD-10-CM | POA: Diagnosis not present

## 2017-12-03 DIAGNOSIS — L03115 Cellulitis of right lower limb: Secondary | ICD-10-CM | POA: Diagnosis not present

## 2017-12-03 DIAGNOSIS — G20 Parkinson's disease: Secondary | ICD-10-CM | POA: Diagnosis not present

## 2017-12-03 DIAGNOSIS — I509 Heart failure, unspecified: Secondary | ICD-10-CM | POA: Diagnosis not present

## 2017-12-03 DIAGNOSIS — R6889 Other general symptoms and signs: Secondary | ICD-10-CM | POA: Diagnosis not present

## 2017-12-03 DIAGNOSIS — I739 Peripheral vascular disease, unspecified: Secondary | ICD-10-CM | POA: Diagnosis not present

## 2017-12-10 ENCOUNTER — Other Ambulatory Visit: Payer: Self-pay | Admitting: Neurology

## 2017-12-17 ENCOUNTER — Emergency Department (HOSPITAL_COMMUNITY): Payer: PPO

## 2017-12-17 ENCOUNTER — Encounter (HOSPITAL_COMMUNITY): Payer: Self-pay

## 2017-12-17 ENCOUNTER — Inpatient Hospital Stay (HOSPITAL_COMMUNITY)
Admission: EM | Admit: 2017-12-17 | Discharge: 2017-12-20 | DRG: 872 | Disposition: A | Discharge status: Skilled Nursing Facility | Payer: PPO | Source: Skilled Nursing Facility | Attending: Family Medicine | Admitting: Family Medicine

## 2017-12-17 ENCOUNTER — Other Ambulatory Visit: Payer: Self-pay

## 2017-12-17 DIAGNOSIS — Z7982 Long term (current) use of aspirin: Secondary | ICD-10-CM

## 2017-12-17 DIAGNOSIS — K219 Gastro-esophageal reflux disease without esophagitis: Secondary | ICD-10-CM | POA: Diagnosis present

## 2017-12-17 DIAGNOSIS — Z8249 Family history of ischemic heart disease and other diseases of the circulatory system: Secondary | ICD-10-CM

## 2017-12-17 DIAGNOSIS — L03115 Cellulitis of right lower limb: Secondary | ICD-10-CM | POA: Diagnosis present

## 2017-12-17 DIAGNOSIS — I2510 Atherosclerotic heart disease of native coronary artery without angina pectoris: Secondary | ICD-10-CM | POA: Diagnosis present

## 2017-12-17 DIAGNOSIS — A419 Sepsis, unspecified organism: Secondary | ICD-10-CM | POA: Diagnosis not present

## 2017-12-17 DIAGNOSIS — Z9049 Acquired absence of other specified parts of digestive tract: Secondary | ICD-10-CM | POA: Diagnosis not present

## 2017-12-17 DIAGNOSIS — R269 Unspecified abnormalities of gait and mobility: Secondary | ICD-10-CM | POA: Diagnosis present

## 2017-12-17 DIAGNOSIS — M2550 Pain in unspecified joint: Secondary | ICD-10-CM | POA: Diagnosis not present

## 2017-12-17 DIAGNOSIS — I7389 Other specified peripheral vascular diseases: Secondary | ICD-10-CM | POA: Diagnosis not present

## 2017-12-17 DIAGNOSIS — K50918 Crohn's disease, unspecified, with other complication: Secondary | ICD-10-CM | POA: Diagnosis not present

## 2017-12-17 DIAGNOSIS — Z6825 Body mass index (BMI) 25.0-25.9, adult: Secondary | ICD-10-CM | POA: Diagnosis not present

## 2017-12-17 DIAGNOSIS — I110 Hypertensive heart disease with heart failure: Secondary | ICD-10-CM | POA: Diagnosis present

## 2017-12-17 DIAGNOSIS — L0390 Cellulitis, unspecified: Secondary | ICD-10-CM

## 2017-12-17 DIAGNOSIS — M810 Age-related osteoporosis without current pathological fracture: Secondary | ICD-10-CM | POA: Diagnosis not present

## 2017-12-17 DIAGNOSIS — G20 Parkinson's disease: Secondary | ICD-10-CM | POA: Diagnosis present

## 2017-12-17 DIAGNOSIS — R0902 Hypoxemia: Secondary | ICD-10-CM | POA: Diagnosis not present

## 2017-12-17 DIAGNOSIS — W19XXXA Unspecified fall, initial encounter: Secondary | ICD-10-CM | POA: Diagnosis present

## 2017-12-17 DIAGNOSIS — Z800 Family history of malignant neoplasm of digestive organs: Secondary | ICD-10-CM

## 2017-12-17 DIAGNOSIS — Z7951 Long term (current) use of inhaled steroids: Secondary | ICD-10-CM

## 2017-12-17 DIAGNOSIS — Z66 Do not resuscitate: Secondary | ICD-10-CM | POA: Diagnosis present

## 2017-12-17 DIAGNOSIS — R5381 Other malaise: Secondary | ICD-10-CM | POA: Diagnosis not present

## 2017-12-17 DIAGNOSIS — K5090 Crohn's disease, unspecified, without complications: Secondary | ICD-10-CM | POA: Diagnosis not present

## 2017-12-17 DIAGNOSIS — Z825 Family history of asthma and other chronic lower respiratory diseases: Secondary | ICD-10-CM | POA: Diagnosis not present

## 2017-12-17 DIAGNOSIS — H9190 Unspecified hearing loss, unspecified ear: Secondary | ICD-10-CM | POA: Diagnosis present

## 2017-12-17 DIAGNOSIS — E785 Hyperlipidemia, unspecified: Secondary | ICD-10-CM | POA: Diagnosis not present

## 2017-12-17 DIAGNOSIS — R413 Other amnesia: Secondary | ICD-10-CM | POA: Diagnosis present

## 2017-12-17 DIAGNOSIS — R0689 Other abnormalities of breathing: Secondary | ICD-10-CM | POA: Diagnosis not present

## 2017-12-17 DIAGNOSIS — I10 Essential (primary) hypertension: Secondary | ICD-10-CM | POA: Diagnosis present

## 2017-12-17 DIAGNOSIS — Z85820 Personal history of malignant melanoma of skin: Secondary | ICD-10-CM

## 2017-12-17 DIAGNOSIS — Z955 Presence of coronary angioplasty implant and graft: Secondary | ICD-10-CM | POA: Diagnosis not present

## 2017-12-17 DIAGNOSIS — I5032 Chronic diastolic (congestive) heart failure: Secondary | ICD-10-CM | POA: Diagnosis not present

## 2017-12-17 DIAGNOSIS — Z7401 Bed confinement status: Secondary | ICD-10-CM | POA: Diagnosis not present

## 2017-12-17 DIAGNOSIS — I4510 Unspecified right bundle-branch block: Secondary | ICD-10-CM | POA: Diagnosis not present

## 2017-12-17 DIAGNOSIS — I951 Orthostatic hypotension: Secondary | ICD-10-CM | POA: Diagnosis present

## 2017-12-17 DIAGNOSIS — I252 Old myocardial infarction: Secondary | ICD-10-CM | POA: Diagnosis not present

## 2017-12-17 DIAGNOSIS — R531 Weakness: Secondary | ICD-10-CM | POA: Diagnosis not present

## 2017-12-17 DIAGNOSIS — G2581 Restless legs syndrome: Secondary | ICD-10-CM | POA: Diagnosis present

## 2017-12-17 DIAGNOSIS — Z87442 Personal history of urinary calculi: Secondary | ICD-10-CM | POA: Diagnosis not present

## 2017-12-17 DIAGNOSIS — I255 Ischemic cardiomyopathy: Secondary | ICD-10-CM | POA: Diagnosis not present

## 2017-12-17 DIAGNOSIS — K760 Fatty (change of) liver, not elsewhere classified: Secondary | ICD-10-CM | POA: Diagnosis present

## 2017-12-17 DIAGNOSIS — Z79899 Other long term (current) drug therapy: Secondary | ICD-10-CM

## 2017-12-17 DIAGNOSIS — J449 Chronic obstructive pulmonary disease, unspecified: Secondary | ICD-10-CM | POA: Diagnosis present

## 2017-12-17 DIAGNOSIS — S79911A Unspecified injury of right hip, initial encounter: Secondary | ICD-10-CM | POA: Diagnosis not present

## 2017-12-17 DIAGNOSIS — R000 Tachycardia, unspecified: Secondary | ICD-10-CM | POA: Diagnosis not present

## 2017-12-17 DIAGNOSIS — M25551 Pain in right hip: Secondary | ICD-10-CM | POA: Diagnosis not present

## 2017-12-17 DIAGNOSIS — E876 Hypokalemia: Secondary | ICD-10-CM | POA: Diagnosis present

## 2017-12-17 DIAGNOSIS — R509 Fever, unspecified: Secondary | ICD-10-CM | POA: Diagnosis not present

## 2017-12-17 LAB — I-STAT CG4 LACTIC ACID, ED
Lactic Acid, Venous: 1.21 mmol/L (ref 0.5–1.9)
Lactic Acid, Venous: 1.28 mmol/L (ref 0.5–1.9)

## 2017-12-17 LAB — URINALYSIS, ROUTINE W REFLEX MICROSCOPIC
Bilirubin Urine: NEGATIVE
Glucose, UA: NEGATIVE mg/dL
Hgb urine dipstick: NEGATIVE
Ketones, ur: 20 mg/dL — AB
Leukocytes, UA: NEGATIVE
Nitrite: NEGATIVE
Protein, ur: NEGATIVE mg/dL
Specific Gravity, Urine: 1.018 (ref 1.005–1.030)
pH: 5 (ref 5.0–8.0)

## 2017-12-17 LAB — CBC WITH DIFFERENTIAL/PLATELET
Basophils Absolute: 0 10*3/uL (ref 0.0–0.1)
Basophils Relative: 0 %
Eosinophils Absolute: 0 10*3/uL (ref 0.0–0.7)
Eosinophils Relative: 0 %
HCT: 39.7 % (ref 39.0–52.0)
Hemoglobin: 13.1 g/dL (ref 13.0–17.0)
Lymphocytes Relative: 5 %
Lymphs Abs: 1 10*3/uL (ref 0.7–4.0)
MCH: 31.3 pg (ref 26.0–34.0)
MCHC: 33 g/dL (ref 30.0–36.0)
MCV: 94.7 fL (ref 78.0–100.0)
Monocytes Absolute: 0.8 10*3/uL (ref 0.1–1.0)
Monocytes Relative: 4 %
Neutro Abs: 16.7 10*3/uL — ABNORMAL HIGH (ref 1.7–7.7)
Neutrophils Relative %: 91 %
Platelets: 232 10*3/uL (ref 150–400)
RBC: 4.19 MIL/uL — ABNORMAL LOW (ref 4.22–5.81)
RDW: 14.1 % (ref 11.5–15.5)
WBC: 18.5 10*3/uL — ABNORMAL HIGH (ref 4.0–10.5)

## 2017-12-17 LAB — COMPREHENSIVE METABOLIC PANEL
ALT: 7 U/L (ref 0–44)
AST: 28 U/L (ref 15–41)
Albumin: 4.3 g/dL (ref 3.5–5.0)
Alkaline Phosphatase: 83 U/L (ref 38–126)
Anion gap: 14 (ref 5–15)
BUN: 23 mg/dL (ref 8–23)
CO2: 24 mmol/L (ref 22–32)
Calcium: 9.1 mg/dL (ref 8.9–10.3)
Chloride: 103 mmol/L (ref 98–111)
Creatinine, Ser: 1.18 mg/dL (ref 0.61–1.24)
GFR calc Af Amer: >60 mL/min (ref >60)
GFR calc non Af Amer: 54 mL/min — ABNORMAL LOW (ref >60)
Glucose, Bld: 136 mg/dL — ABNORMAL HIGH (ref 70–99)
Potassium: 3.7 mmol/L (ref 3.5–5.1)
Sodium: 141 mmol/L (ref 135–145)
Total Bilirubin: 2.3 mg/dL — ABNORMAL HIGH (ref 0.3–1.2)
Total Protein: 8.6 g/dL — ABNORMAL HIGH (ref 6.5–8.1)

## 2017-12-17 LAB — PROTIME-INR
INR: 1.03
Prothrombin Time: 13.4 seconds (ref 11.4–15.2)

## 2017-12-17 LAB — LACTIC ACID, PLASMA
Lactic Acid, Venous: 1.3 mmol/L (ref 0.5–1.9)
Lactic Acid, Venous: 1.2 mmol/L (ref 0.5–1.9)

## 2017-12-17 MED ORDER — SODIUM CHLORIDE 0.9 % IV SOLN
1000.0000 mL | INTRAVENOUS | Status: DC
  Administered 2017-12-17: 1000 mL via INTRAVENOUS

## 2017-12-17 MED ORDER — SODIUM CHLORIDE 0.9 % IV BOLUS (SEPSIS)
1000.0000 mL | Freq: Once | INTRAVENOUS | Status: AC
  Administered 2017-12-17: 1000 mL via INTRAVENOUS

## 2017-12-17 MED ORDER — SODIUM CHLORIDE 0.9 % IV SOLN
2.0000 g | INTRAVENOUS | Status: DC
  Administered 2017-12-17 – 2017-12-19 (×3): 2 g via INTRAVENOUS
  Filled 2017-12-17: qty 2
  Filled 2017-12-17 (×2): qty 20
  Filled 2017-12-17: qty 2

## 2017-12-17 NOTE — ED Notes (Signed)
2 sets of blood cultures sent down. Pt aware that urine sample is needed

## 2017-12-17 NOTE — ED Provider Notes (Signed)
[facility] DEPT Provider Note   CSN: [id number] Arrival date & time: 12/17/17  2004     History   Chief Complaint Chief Complaint  Patient presents with  . Fever  . Weakness    HPI [name] [name] is a 84 y.o. male.  HPI Pt has a history of cellulitis in the past.  On Friday he started having trouble with redness and swelling in his right leg.  He tried compression stockings.    He saw the doctor today.  They prescribed abx.  His sx got worse today and he became very weak and fell.  He developed a fever to 101.  They called the doctor and suggested he come to the ED.  Past Medical History:  Diagnosis Date  . Cellulitis   . Chronic diastolic CHF (congestive heart failure) (HCC)    a. EF initially 35-40% after MI 1/09; b. echo 7/08: EF 60%;  c. 11/2014 Echo: EF 65-70%, Gr 1 DD, mild MR.  . Coronary artery disease    a. s/p anterior STEMI 04/2007 with BMS-> LAD;  b. Cath 12/2011 patent stent;  c. low risk nuc 04/2014.  . Crohn's disease ([place])   . Diaphragmatic hernia without mention of obstruction or gangrene   . Diverticulosis of colon (without mention of hemorrhage)   . Fatty liver    a. on ultrasound of 10/2009  . Flatulence, eructation, and gas pain   . Fungal infection   . Gait disorder   . GERD (gastroesophageal reflux disease)   . HOH (hard of hearing)    Hearing aids  . Hyperlipidemia   . Hypertension   . Ischemic cardiomyopathy    a. EF initially 35-40% after MI 1/09; b. echo 7/08: EF 60%;  c. 11/2014 Echo: EF 65-70%, Gr 1 DD, mild MR.  . Melanoma ([place])   . Memory disorder   . Orthostatic hypotension   . Osteoporosis   . Other chronic nonalcoholic liver disease   . Other esophagitis   . Parkinson's disease ([place])   . RBBB 09/02/2013  . Regional enteritis of small intestine ([place])   . Renal calculi   . Restless leg syndrome 03/22/2015  . 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 Urinary incontinence     Patient Active Problem  List   Diagnosis Date Noted  . Restless leg syndrome 03/22/2015  . Parkinson disease ([place]) 12/30/2014  . Alternaria infection ([place]) 12/29/2014  . Ischemic cardiomyopathy   . Coronary artery disease   . Chronic diastolic CHF (congestive heart failure) ([place])   . NASH (nonalcoholic steatohepatitis) 11/20/2014  . Peripheral edema   . Dizziness 11/11/2014  . Pressure ulcer 11/11/2014  . Orthostatic hypotension   . Dyspnea on exertion 04/22/2014  . Dehydration 09/28/2012  . Acute kidney injury ([place]) 09/28/2012  . Memory loss 08/19/2012  . Paralysis agitans ([place]) 08/19/2012  . Abnormality of gait 08/19/2012  . Dysphagia 05/24/2011  . Tinea corporis 01/04/2011  . Angina of effort ([place]) 12/14/2010  . Fatigue 12/14/2010  . NOSEBLEED 05/23/2010  . Hyperlipidemia 04/29/2007  . Essential hypertension 04/29/2007  . GERD 04/29/2007  . Crohn's disease ([place]) 04/29/2007  . SMALL BOWEL OBSTRUCTION 04/29/2007  . DIVERTICULOSIS, COLON 04/29/2007  . FATTY LIVER DISEASE 08/30/2006  . HEMORRHOIDS 03/26/2005  . Erosive esophagitis 09/29/1997  . HIATAL HERNIA 09/29/1997    Past Surgical History:  Procedure Laterality Date  . APPENDECTOMY    . BACK SURGERY     L3, L4, L5  .  CARDIAC CATHETERIZATION  09/25/06   EF 35-40% but more recently 60%  . CATARACT EXTRACTION     Bilateral  . CHOLECYSTECTOMY    . Coronary artery stent placement    . Melanoma resection    . Partial bowel resection    . TONSILLECTOMY          Home Medications    Prior to Admission medications   Medication Sig Start Date End Date Taking? Authorizing Provider  acetaminophen (TYLENOL) 325 MG tablet Take 650 mg by mouth every 6 (six) hours as needed for pain.   Yes [provider]  ADVAIR DISKUS 250-50 MCG/DOSE AEPB Inhale 1 puff into the lungs daily as needed. For shortness of breath 01/24/14  Yes [provider]  atorvastatin (LIPITOR) 40 MG tablet Take 40 mg by mouth daily.   Yes [provider]  carbidopa-levodopa (SINEMET CR) 50-200 MG tablet Take 1 tablet by mouth 4 (four) times daily. 10/03/17  Yes [name], MD  cholestyramine Lucrezia Starch) 4 g packet TAKE 1 PACKET BY MOUTH TWICE DAILY WITH A MEAL 01/16/17  Yes [name], MD  donepezil (ARICEPT) 10 MG tablet TAKE ONE TABLET AT BEDTIME 12/10/17  Yes [name], MD  torsemide (DEMADEX) 20 MG tablet Take 1 tablet (20 mg total) by mouth daily. 11/23/14  Yes Dunn, [name], PA-C  aspirin EC 81 MG tablet Take 81 mg by mouth daily.    [provider]  calcium carbonate (CALCIUM 600) 600 MG TABS tablet Take 600 mg by mouth daily with breakfast.    [provider]  carvedilol (COREG) 3.125 MG tablet Take 3.125 mg by mouth daily.     [provider]  Cholecalciferol (VITAMIN D-3) 1000 UNITS CAPS Take 1,000 Units by mouth daily.    [provider]  doxycycline (VIBRA-TABS) 100 MG tablet Take 100 mg by mouth 2 (two) times daily. 12/17/17   [provider]  midodrine (PROAMATINE) 5 MG tablet Take 1 tablet (5 mg total) by mouth 2 (two) times daily with a meal. 11/23/14   Dunn, Dayna N, PA-C  Multiple Vitamins-Minerals (CENTRUM SILVER PO) Take 1 tablet by mouth daily.    [provider]  nitroGLYCERIN (NITROSTAT) 0.4 MG SL tablet Place 1 tablet (0.4 mg total) under the tongue every 5 (five) minutes as needed for chest pain. PT OVERDUE FOR OV PLEASE CALL FOR APPT 07/07/17   [name], [name], MD  pantoprazole (PROTONIX) 40 MG tablet Take 40 mg by mouth daily.  12/30/14   [provider]  potassium chloride SA (K-DUR,KLOR-CON) 20 MEQ tablet Take 40 mEq by mouth daily.    [provider]  vitamin B-12 (CYANOCOBALAMIN) 1000 MCG tablet Take 1,000 mcg by mouth daily.    [provider]    Family History Family History  Problem Relation Age of Onset  . Heart failure Mother   . Heart attack Mother   . Hypertension Mother   . Emphysema Father   .  Heart disease Brother   . Heart disease Brother   . Coronary artery disease Unknown   . Colon cancer Brother        older at dx  . Stomach cancer Neg Hx     Social History Social History   Tobacco Use  . Smoking status: Never Smoker  . Smokeless tobacco: Never Used  Substance Use Topics  . Alcohol use: Yes    Alcohol/week: 2.0 - 3.0 standard drinks    Types:  2 - 3 Standard drinks or equivalent per week    Comment: occ   . Drug use: No     Allergies   Patient has no known allergies.   Review of Systems Review of Systems  All other systems reviewed and are negative.    Physical Exam Updated Vital Signs BP 129/73 (BP Location: Right Arm)   Pulse (!) 122   Temp 99.1 F (37.3 C) (Oral)   Resp (!) 32   SpO2 98%   Physical Exam  Constitutional: He appears well-developed and well-nourished. No distress.  HENT:  Head: Normocephalic and atraumatic.  Right Ear: External ear normal.  Left Ear: External ear normal.  Eyes: Conjunctivae are normal. Right eye exhibits no discharge. Left eye exhibits no discharge. No scleral icterus.  Neck: Neck supple. No tracheal deviation present.  Cardiovascular: Regular rhythm and intact distal pulses. Tachycardia present.  Pulmonary/Chest: Effort normal and breath sounds normal. No stridor. No respiratory distress. He has no wheezes. He has no rales.  Abdominal: Soft. Bowel sounds are normal. He exhibits no distension. There is no tenderness. There is no rebound and no guarding.  Musculoskeletal: He exhibits edema. He exhibits no tenderness.  Erythema right foot and right lower leg, ttp, increased warmth  Neurological: He is alert. He has normal strength. No cranial nerve deficit (no facial droop, extraocular movements intact, no slurred speech) or sensory deficit. He exhibits normal muscle tone. He displays no seizure activity. Coordination normal.  Skin: Skin is warm and dry. No rash noted.  Psychiatric: He has a normal mood and  affect.  Nursing note and vitals reviewed.    ED Treatments / Results  Labs (all labs ordered are listed, but only abnormal results are displayed) Labs Reviewed  COMPREHENSIVE METABOLIC PANEL - Abnormal; Notable for the following components:      Result Value   Glucose, Bld 136 (*)    Total Protein 8.6 (*)    Total Bilirubin 2.3 (*)    GFR calc non Af Amer 54 (*)    All other components within normal limits  CBC WITH DIFFERENTIAL/PLATELET - Abnormal; Notable for the following components:   WBC 18.5 (*)    RBC 4.19 (*)    Neutro Abs 16.7 (*)    All other components within normal limits  CULTURE, BLOOD (ROUTINE X 2)  CULTURE, BLOOD (ROUTINE X 2)  URINE CULTURE  PROTIME-INR  LACTIC ACID, PLASMA  LACTIC ACID, PLASMA  URINALYSIS, ROUTINE W REFLEX MICROSCOPIC  I-STAT CG4 LACTIC ACID, ED  I-STAT CG4 LACTIC ACID, ED    EKG EKG Interpretation  Date/Time:  Wednesday December 17 2017 21:25:30 EDT Ventricular Rate:  117 PR Interval:    QRS Duration: 131 QT Interval:  409 QTC Calculation: 571 R Axis:   46 Text Interpretation:  sinus tachycardia Right bundle branch block No significant change since last tracing Confirmed by Dorie Rank [phone]) on 12/17/2017 10:28:25 PM   Radiology Dg Chest 2 View  Result Date: 12/17/2017 CLINICAL DATA:  Fever EXAM: CHEST - 2 VIEW COMPARISON:  08/14/2017 FINDINGS: The heart size and mediastinal contours are within normal limits. Both lungs are clear. Degenerative spurring of the spine. IMPRESSION: No active cardiopulmonary disease. Electronically Signed   By: Donavan Foil M.D.   On: 12/17/2017 21:59   Dg Hip Unilat W Or Wo Pelvis 2-3 Views Right  Result Date: 12/17/2017 CLINICAL DATA:  Fall with hip pain EXAM: DG HIP (WITH OR WITHOUT PELVIS) 2-3V RIGHT COMPARISON:  CT 11/21/2017 FINDINGS:  Postsurgical changes in the lumbosacral spine. Mild SI joint degenerative changes. Pubic symphysis and rami are intact. No fracture or malalignment. IMPRESSION:  No acute osseous abnormality. Electronically Signed   By: Donavan Foil M.D.   On: 12/17/2017 22:00    Procedures Procedures (including critical care time)  Medications Ordered in ED Medications  cefTRIAXone (ROCEPHIN) 2 g in sodium chloride 0.9 % 100 mL IVPB (0 g Intravenous Stopped 12/17/17 2248)  sodium chloride 0.9 % bolus 1,000 mL (1,000 mLs Intravenous New Bag/Given 12/17/17 2144)    Followed by  0.9 %  sodium chloride infusion (1,000 mLs Intravenous New Bag/Given 12/17/17 2146)     Initial Impression / Assessment and Plan / ED Course  I have reviewed the triage vital signs and the nursing notes.  Pertinent labs & imaging results that were available during my care of the patient were reviewed by me and considered in my medical decision making (see chart for details).  Clinical Course as of Dec 17 2300  Wed Dec 17, 2017  2300 Heart rate is improving.  Now down in the low 100s   [JK]    Clinical Course User Index [JK] Dorie Rank, MD   Patient presented to the emergency room for evaluation of fever and weakness.  Patient was diagnosed with cellulitis as an outpatient.  His symptoms progressed today and he started having a fever and weakness.  Patient was tachycardic in the emergency room.  He has an obvious cellulitis in his right lower extremity.  Patient does not have any signs of sepsis.  He does not have a lactic acidosis.  He remains normotensive.  Patient was started on IV antibiotics.  I will consult the medical service for admission and further treatment.  Final Clinical Impressions(s) / ED Diagnoses   Final diagnoses:  Cellulitis of right lower extremity      Dorie Rank, MD 12/17/17 2302

## 2017-12-17 NOTE — ED Notes (Signed)
Pt aware that urine sample is needed. Pt family sts that pt is incontinent.  RN and EDP notified.

## 2017-12-17 NOTE — ED Notes (Signed)
Patient transported to X-ray 

## 2017-12-17 NOTE — ED Triage Notes (Signed)
Pt has cellulitis in his right leg, was seen at the doctor today for that Pt had a fever of 101 and he's tachycardic Pt has been feeling bad for two days, he's been lethargic and weak

## 2017-12-17 NOTE — ED Notes (Signed)
Bed: WA02 Expected date:  Expected time:  Means of arrival:  Comments: 84 yr old male, fever, tachy, lethargic

## 2017-12-17 NOTE — H&P (Signed)
History and Physical    [name] [name] ZWC:[id number] DOB: [birth year] DOA: 12/17/2017  Referring MD/NP/PA: Dorie Rank, MD PCP: Shon Baton, MD  Patient coming from: [facility] via [facility]  Chief Complaint: Fall and fever  I have personally briefly reviewed patient's old medical records in [place]   HPI: [name] [name] is a 84 y.o. male with medical history significant of HTN, HLD, diastolic CHF, CAD s/p stent, Parkinson's disease, and Crohn's disease; who presents after having a fall..  Patient had been coming out of the bathroom and wife witnessed him fall forward into a soft stool and hit his head on the mattress of the bed.  There is no reported loss of consciousness.  At baseline patient ambulates with a walker due to worsening gait issues related to Parkinson's, but he was not reportedly using a walker at this time. Have been seen by his primary care provider and started on doxycycline today, but had developed fever and came into the emergency department for further evaluation.  Early in the day he had been noted to complain of some generalized aches and malaise which she is taking Tylenol.  Patient had also been seen by his primary care provider earlier in the day due to redness of the right leg.  He had previously completed a 10-day course of antibiotics thought to be Augmentin 1-2 weeks earlier with resolution of symptoms.  However, redness and swelling of the right lateral aspect of the leg returned over the weekend.  His provider had prescribed him doxycycline to start taking today.  However after the fall patient was noted to have fever up to 101 F along with tachycardia, and they came to the emergency department for further evaluation.  Other associated symptoms include diarrhea related to Crohn's and callus of the right second toe for which patient has a appointment with orthopedics next week.  Patient denies having any significant shortness of breath, chest pain, nausea, vomiting,  or abdominal pain.  ED Course: Upon admission to the emergency department patient was noted to have a temperature of 99.1 F, pulse 101-1 22, respiration 22-32, and all other vital signs maintained.  Labs revealed WBC 18.5, total bilirubin 2.3, and lactic acid 1.3.  Chest x-ray showed no acute abnormality.  X-rays of the right hip sepsis protocol was initiated without fluid bolus and blood cultures obtained.  Patient was started on empiric antibiotics of Rocephin.  TRH called to admit.  Review of Systems  Musculoskeletal: Positive for falls.  Psychiatric/Behavioral: Positive for memory loss.  All other systems reviewed and are negative.   Past Medical History:  Diagnosis Date  . Cellulitis   . Chronic diastolic CHF (congestive heart failure) (HCC)    a. EF initially 35-40% after MI 1/09; b. echo 7/08: EF 60%;  c. 11/2014 Echo: EF 65-70%, Gr 1 DD, mild MR.  . Coronary artery disease    a. s/p anterior STEMI 04/2007 with BMS-> LAD;  b. Cath 12/2011 patent stent;  c. low risk nuc 04/2014.  . Crohn's disease ([place])   . Diaphragmatic hernia without mention of obstruction or gangrene   . Diverticulosis of colon (without mention of hemorrhage)   . Fatty liver    a. on ultrasound of 10/2009  . Flatulence, eructation, and gas pain   . Fungal infection   . Gait disorder   . GERD (gastroesophageal reflux disease)   . HOH (hard of hearing)    Hearing aids  . Hyperlipidemia   . Hypertension   .  Ischemic cardiomyopathy    a. EF initially 35-40% after MI 1/09; b. echo 7/08: EF 60%;  c. 11/2014 Echo: EF 65-70%, Gr 1 DD, mild MR.  . Melanoma ([place])   . Memory disorder   . Orthostatic hypotension   . Osteoporosis   . Other chronic nonalcoholic liver disease   . Other esophagitis   . Parkinson's disease ([place])   . RBBB 09/02/2013  . Regional enteritis of small intestine ([place])   . Renal calculi   . Restless leg syndrome 03/22/2015  . 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 Urinary incontinence      Past Surgical History:  Procedure Laterality Date  . APPENDECTOMY    . BACK SURGERY     L3, L4, L5  . CARDIAC CATHETERIZATION  09/25/06   EF 35-40% but more recently 60%  . CATARACT EXTRACTION     Bilateral  . CHOLECYSTECTOMY    . Coronary artery stent placement    . Melanoma resection    . Partial bowel resection    . TONSILLECTOMY       reports that he has never smoked. He has never used smokeless tobacco. He reports that he drinks about 2.0 - 3.0 standard drinks of alcohol per week. He reports that he does not use drugs.  No Known Allergies  Family History  Problem Relation Age of Onset  . Heart failure Mother   . Heart attack Mother   . Hypertension Mother   . Emphysema Father   . Heart disease Brother   . Heart disease Brother   . Coronary artery disease Unknown   . Colon cancer Brother        older at dx  . Stomach cancer Neg Hx     Prior to Admission medications   Medication Sig Start Date End Date Taking? Authorizing Provider  acetaminophen (TYLENOL) 325 MG tablet Take 650 mg by mouth every 6 (six) hours as needed for pain.   Yes [provider]  ADVAIR DISKUS 250-50 MCG/DOSE AEPB Inhale 1 puff into the lungs daily as needed. For shortness of breath 01/24/14  Yes [provider]  atorvastatin (LIPITOR) 40 MG tablet Take 40 mg by mouth daily.   Yes [provider]  carbidopa-levodopa (SINEMET CR) 50-200 MG tablet Take 1 tablet by mouth 4 (four) times daily. 10/03/17  Yes [name], MD  cholestyramine Lucrezia Starch) 4 g packet TAKE 1 PACKET BY MOUTH TWICE DAILY WITH A MEAL 01/16/17  Yes [name], MD  donepezil (ARICEPT) 10 MG tablet TAKE ONE TABLET AT BEDTIME 12/10/17  Yes [name], MD  torsemide (DEMADEX) 20 MG tablet Take 1 tablet (20 mg total) by mouth daily. 11/23/14  Yes Dunn, [name], PA-C  aspirin EC 81 MG tablet Take 81 mg by mouth daily.    [provider]  calcium carbonate (CALCIUM 600) 600 MG TABS  tablet Take 600 mg by mouth daily with breakfast.    [provider]  carvedilol (COREG) 3.125 MG tablet Take 3.125 mg by mouth daily.     [provider]  Cholecalciferol (VITAMIN D-3) 1000 UNITS CAPS Take 1,000 Units by mouth daily.    [provider]  doxycycline (VIBRA-TABS) 100 MG tablet Take 100 mg by mouth 2 (two) times daily. 12/17/17   [provider]  midodrine (PROAMATINE) 5 MG tablet Take 1 tablet (5 mg total) by mouth 2 (two) times daily with a meal. 11/23/14   Charlie Pitter, PA-C  Multiple Vitamins-Minerals (CENTRUM SILVER PO) Take 1 tablet by mouth daily.    [provider]  nitroGLYCERIN (NITROSTAT) 0.4 MG SL tablet Place 1 tablet (0.4 mg total) under the tongue every 5 (five) minutes as needed for chest pain. PT OVERDUE FOR OV PLEASE CALL FOR APPT 07/07/17   [name], [name], MD  pantoprazole (PROTONIX) 40 MG tablet Take 40 mg by mouth daily.  12/30/14   [provider]  potassium chloride SA (K-DUR,KLOR-CON) 20 MEQ tablet Take 40 mEq by mouth daily.    [provider]  vitamin B-12 (CYANOCOBALAMIN) 1000 MCG tablet Take 1,000 mcg by mouth daily.    [provider]    Physical Exam:  Constitutional: Elderly male currently in no acute distress Vitals:   12/17/17 2015 12/17/17 2021  BP:  129/73  Pulse:  (!) 122  Resp:  (!) 32  Temp:  99.1 F (37.3 C)  TempSrc:  Oral  SpO2: 96% 98%   Eyes: PERRL, lids and conjunctivae normal ENMT: Mucous membranes are moist. Posterior pharynx clear of any exudate or lesions.  Neck: normal, supple, no masses, no thyromegaly Respiratory: clear to auscultation bilaterally, no wheezing, no crackles. Normal respiratory effort. No accessory muscle use.  Cardiovascular: Regular rate and rhythm, no murmurs / rubs / gallops. No extremity edema. 2+ pedal pulses. No carotid bruits.  Abdomen: no tenderness, no masses palpated. No hepatosplenomegaly. Bowel sounds positive.    Musculoskeletal: no clubbing / cyanosis. No joint deformity upper and lower extremities. Good ROM, no contractures. Normal muscle tone.  Skin: Erythema with increased warmth of the lateral aspect of the right leg from the foot to the knee with callus present at the right toe. Neurologic: CN 2-12 grossly intact. Sensation intact, DTR normal.  Tremor present.Marland Kitchen  Psychiatric: Normal judgment and insight. Alert and oriented x 3.  Flat affect.     Labs on Admission: I have personally reviewed following labs and imaging studies  CBC: Recent Labs  Lab 12/17/17 2044  WBC 18.5*  NEUTROABS 16.7*  HGB 13.1  HCT 39.7  MCV 94.7  PLT 970   Basic Metabolic Panel: Recent Labs  Lab 12/17/17 2044  NA 141  K 3.7  CL 103  CO2 24  GLUCOSE 136*  BUN 23  CREATININE 1.18  CALCIUM 9.1   GFR: CrCl cannot be calculated (Unknown ideal weight.). Liver Function Tests: Recent Labs  Lab 12/17/17 2044  AST 28  ALT 7  ALKPHOS 83  BILITOT 2.3*  PROT 8.6*  ALBUMIN 4.3   No results for input(s): LIPASE, AMYLASE in the last 168 hours. No results for input(s): AMMONIA in the last 168 hours. Coagulation Profile: Recent Labs  Lab 12/17/17 2044  INR 1.03   Cardiac Enzymes: No results for input(s): CKTOTAL, CKMB, CKMBINDEX, TROPONINI in the last 168 hours. BNP (last 3 results) No results for input(s): PROBNP in the last 8760 hours. HbA1C: No results for input(s): HGBA1C in the last 72 hours. CBG: No results for input(s): GLUCAP in the last 168 hours. Lipid Profile: No results for input(s): CHOL, HDL, LDLCALC, TRIG, CHOLHDL, LDLDIRECT in the last 72 hours. Thyroid Function Tests: No results for input(s): TSH, T4TOTAL, FREET4, T3FREE, THYROIDAB in the last 72 hours. Anemia Panel: No results for input(s): VITAMINB12, FOLATE, FERRITIN, TIBC, IRON, RETICCTPCT in the last 72 hours. Urine analysis:    Component Value Date/Time   COLORURINE YELLOW 12/17/2017 2224   APPEARANCEUR CLEAR 12/17/2017  2224   LABSPEC 1.018 12/17/2017 2224   PHURINE  5.0 12/17/2017 2224   GLUCOSEU NEGATIVE 12/17/2017 2224   HGBUR NEGATIVE 12/17/2017 2224   BILIRUBINUR NEGATIVE 12/17/2017 2224   KETONESUR 20 (A) 12/17/2017 2224   PROTEINUR NEGATIVE 12/17/2017 2224   UROBILINOGEN 0.2 12/12/2015 2016   NITRITE NEGATIVE 12/17/2017 2224   LEUKOCYTESUR NEGATIVE 12/17/2017 2224   Sepsis Labs: No results found for this or any previous visit (from the past 240 hour(s)).   Radiological Exams on Admission: Dg Chest 2 View  Result Date: 12/17/2017 CLINICAL DATA:  Fever EXAM: CHEST - 2 VIEW COMPARISON:  08/14/2017 FINDINGS: The heart size and mediastinal contours are within normal limits. Both lungs are clear. Degenerative spurring of the spine. IMPRESSION: No active cardiopulmonary disease. Electronically Signed   By: Donavan Foil M.D.   On: 12/17/2017 21:59   Dg Hip Unilat W Or Wo Pelvis 2-3 Views Right  Result Date: 12/17/2017 CLINICAL DATA:  Fall with hip pain EXAM: DG HIP (WITH OR WITHOUT PELVIS) 2-3V RIGHT COMPARISON:  CT 11/21/2017 FINDINGS: Postsurgical changes in the lumbosacral spine. Mild SI joint degenerative changes. Pubic symphysis and rami are intact. No fracture or malalignment. IMPRESSION: No acute osseous abnormality. Electronically Signed   By: Donavan Foil M.D.   On: 12/17/2017 22:00    EKG: Independently reviewed.  Sinus tachycardia at 117 bpm  Assessment/Plan Sepsis 2/2 cellulitis: Patient presents after reportedly having fever up to 101F at home with tachycardia and tachypnea.  WBC elevated at 18.5, but lactic acid was reassuring at 1.3.  Patient was noted to have significant erythema of the right leg concerning for cellulitis after recent treatment. Blood cultures were obtained, given 1 L of normal saline bolus, and patient was started on antibiotics of Rocephin.  Of note negative Doppler ultrasound of the right lower extremity for DVT on 8/20. - Admit to a telemetry bed - Follow-up blood  cultures - Continue ceftriaxone - Wound care consult for callus of right toe question possible source of infection - Recheck CBC in a.m.  Essential hypertension, orthostatic hypotension: - Continue Coreg, midodrine, and torosemide  Diastolic CHF: Chronic.  Patient appears to be euvolemic at this time.  Last echocardiogram revealed EF of 65 to 70% with grade 1 diastolic dysfunction. - Strict intake and output  - Daily weights  CAD: Patient status post anterior STEMI in 04/2007 with bare-metal stent to the LAD.  He had a low risk nuclear scan on 04/2014.  -  - Continue aspirin and statin   COPD: Stable - Continue pharmacy substitution of Breo   Hyperbilirubinemia: Acute on chronic.  Total bilirubin 2.3 on admission. - Continue cholestyramine  Parkinson's disease - Continue Sinemet and donepezil  Crohn's disease - Continue Imodium as needed  Hyperlipidemia - Continue atorvastatin  GERD - Continue Protonix  DVT prophylaxis: Lovenox Code Status: DNR Family Communication: Discussed plan of care with the patient family present at bedside Disposition Plan: TBD Consults called: none  Admission status: observation  Norval Morton MD [facility] Pager [phone]   If 7PM-7AM, please contact night-coverage [email or web address] Password [facility]  12/17/2017, 11:16 PM

## 2017-12-17 NOTE — ED Triage Notes (Addendum)
Per ems: pt coming from [facility] c/o fever and generalized weakness/lethargic that started 2 days ago. Seen at PCP earlier today for cellulitis on right leg. Doesn't appear to be in resp distress. A&OX4.   Vitals by ems Temp:101 Resp: 24 CO2: 26

## 2017-12-18 ENCOUNTER — Encounter (HOSPITAL_COMMUNITY): Payer: Self-pay | Admitting: Emergency Medicine

## 2017-12-18 DIAGNOSIS — I5032 Chronic diastolic (congestive) heart failure: Secondary | ICD-10-CM | POA: Diagnosis present

## 2017-12-18 DIAGNOSIS — Z955 Presence of coronary angioplasty implant and graft: Secondary | ICD-10-CM | POA: Diagnosis not present

## 2017-12-18 DIAGNOSIS — Z800 Family history of malignant neoplasm of digestive organs: Secondary | ICD-10-CM | POA: Diagnosis not present

## 2017-12-18 DIAGNOSIS — K219 Gastro-esophageal reflux disease without esophagitis: Secondary | ICD-10-CM | POA: Diagnosis present

## 2017-12-18 DIAGNOSIS — G20 Parkinson's disease: Secondary | ICD-10-CM | POA: Diagnosis present

## 2017-12-18 DIAGNOSIS — K5090 Crohn's disease, unspecified, without complications: Secondary | ICD-10-CM | POA: Diagnosis present

## 2017-12-18 DIAGNOSIS — A419 Sepsis, unspecified organism: Secondary | ICD-10-CM | POA: Diagnosis present

## 2017-12-18 DIAGNOSIS — L03115 Cellulitis of right lower limb: Secondary | ICD-10-CM | POA: Diagnosis present

## 2017-12-18 DIAGNOSIS — Z825 Family history of asthma and other chronic lower respiratory diseases: Secondary | ICD-10-CM | POA: Diagnosis not present

## 2017-12-18 DIAGNOSIS — E785 Hyperlipidemia, unspecified: Secondary | ICD-10-CM | POA: Diagnosis present

## 2017-12-18 DIAGNOSIS — H9190 Unspecified hearing loss, unspecified ear: Secondary | ICD-10-CM | POA: Diagnosis present

## 2017-12-18 DIAGNOSIS — K760 Fatty (change of) liver, not elsewhere classified: Secondary | ICD-10-CM | POA: Diagnosis present

## 2017-12-18 DIAGNOSIS — Z87442 Personal history of urinary calculi: Secondary | ICD-10-CM | POA: Diagnosis not present

## 2017-12-18 DIAGNOSIS — L0390 Cellulitis, unspecified: Secondary | ICD-10-CM | POA: Diagnosis not present

## 2017-12-18 DIAGNOSIS — I4510 Unspecified right bundle-branch block: Secondary | ICD-10-CM | POA: Diagnosis present

## 2017-12-18 DIAGNOSIS — G2581 Restless legs syndrome: Secondary | ICD-10-CM | POA: Diagnosis present

## 2017-12-18 DIAGNOSIS — M810 Age-related osteoporosis without current pathological fracture: Secondary | ICD-10-CM | POA: Diagnosis present

## 2017-12-18 DIAGNOSIS — I255 Ischemic cardiomyopathy: Secondary | ICD-10-CM | POA: Diagnosis present

## 2017-12-18 DIAGNOSIS — Z85820 Personal history of malignant melanoma of skin: Secondary | ICD-10-CM | POA: Diagnosis not present

## 2017-12-18 DIAGNOSIS — Z9049 Acquired absence of other specified parts of digestive tract: Secondary | ICD-10-CM | POA: Diagnosis not present

## 2017-12-18 DIAGNOSIS — W19XXXA Unspecified fall, initial encounter: Secondary | ICD-10-CM | POA: Diagnosis present

## 2017-12-18 DIAGNOSIS — R413 Other amnesia: Secondary | ICD-10-CM | POA: Diagnosis present

## 2017-12-18 DIAGNOSIS — I252 Old myocardial infarction: Secondary | ICD-10-CM | POA: Diagnosis not present

## 2017-12-18 DIAGNOSIS — I2510 Atherosclerotic heart disease of native coronary artery without angina pectoris: Secondary | ICD-10-CM | POA: Diagnosis present

## 2017-12-18 DIAGNOSIS — Z8249 Family history of ischemic heart disease and other diseases of the circulatory system: Secondary | ICD-10-CM | POA: Diagnosis not present

## 2017-12-18 DIAGNOSIS — I110 Hypertensive heart disease with heart failure: Secondary | ICD-10-CM | POA: Diagnosis present

## 2017-12-18 LAB — COMPREHENSIVE METABOLIC PANEL
ALT: 13 U/L (ref 0–44)
AST: 21 U/L (ref 15–41)
Albumin: 3.3 g/dL — ABNORMAL LOW (ref 3.5–5.0)
Alkaline Phosphatase: 65 U/L (ref 38–126)
Anion gap: 12 (ref 5–15)
BUN: 15 mg/dL (ref 8–23)
CO2: 21 mmol/L — ABNORMAL LOW (ref 22–32)
Calcium: 8 mg/dL — ABNORMAL LOW (ref 8.9–10.3)
Chloride: 105 mmol/L (ref 98–111)
Creatinine, Ser: 0.97 mg/dL (ref 0.61–1.24)
GFR calc Af Amer: >60 mL/min (ref >60)
GFR calc non Af Amer: >60 mL/min (ref >60)
Glucose, Bld: 113 mg/dL — ABNORMAL HIGH (ref 70–99)
Potassium: 3.3 mmol/L — ABNORMAL LOW (ref 3.5–5.1)
Sodium: 138 mmol/L (ref 135–145)
Total Bilirubin: 1.8 mg/dL — ABNORMAL HIGH (ref 0.3–1.2)
Total Protein: 6.7 g/dL (ref 6.5–8.1)

## 2017-12-18 LAB — CBC
HCT: 34 % — ABNORMAL LOW (ref 39.0–52.0)
Hemoglobin: 11.3 g/dL — ABNORMAL LOW (ref 13.0–17.0)
MCH: 31.2 pg (ref 26.0–34.0)
MCHC: 33.2 g/dL (ref 30.0–36.0)
MCV: 93.9 fL (ref 78.0–100.0)
Platelets: 197 10*3/uL (ref 150–400)
RBC: 3.62 MIL/uL — ABNORMAL LOW (ref 4.22–5.81)
RDW: 14.3 % (ref 11.5–15.5)
WBC: 16.9 10*3/uL — ABNORMAL HIGH (ref 4.0–10.5)

## 2017-12-18 LAB — MRSA PCR SCREENING: MRSA by PCR: NEGATIVE

## 2017-12-18 LAB — C-REACTIVE PROTEIN: CRP: 6.8 mg/dL — ABNORMAL HIGH (ref <1.0)

## 2017-12-18 LAB — SEDIMENTATION RATE: Sed Rate: 50 mm/hr — ABNORMAL HIGH (ref 0–16)

## 2017-12-18 MED ORDER — ASPIRIN EC 81 MG PO TBEC
81.0000 mg | DELAYED_RELEASE_TABLET | Freq: Every day | ORAL | Status: DC
  Administered 2017-12-18 – 2017-12-20 (×3): 81 mg via ORAL
  Filled 2017-12-18 (×3): qty 1

## 2017-12-18 MED ORDER — TORSEMIDE 20 MG PO TABS
20.0000 mg | ORAL_TABLET | Freq: Every day | ORAL | Status: DC
  Administered 2017-12-18 – 2017-12-20 (×3): 20 mg via ORAL
  Filled 2017-12-18 (×3): qty 1

## 2017-12-18 MED ORDER — DONEPEZIL HCL 10 MG PO TABS
10.0000 mg | ORAL_TABLET | Freq: Every day | ORAL | Status: DC
  Administered 2017-12-18 – 2017-12-20 (×3): 10 mg via ORAL
  Filled 2017-12-18 (×3): qty 1

## 2017-12-18 MED ORDER — LOPERAMIDE HCL 2 MG PO CAPS
2.0000 mg | ORAL_CAPSULE | Freq: Two times a day (BID) | ORAL | Status: DC | PRN
  Administered 2017-12-18: 2 mg via ORAL
  Filled 2017-12-18: qty 1

## 2017-12-18 MED ORDER — POTASSIUM CHLORIDE CRYS ER 20 MEQ PO TBCR
40.0000 meq | EXTENDED_RELEASE_TABLET | Freq: Every day | ORAL | Status: DC
  Administered 2017-12-18 – 2017-12-20 (×2): 40 meq via ORAL
  Filled 2017-12-18 (×3): qty 2

## 2017-12-18 MED ORDER — MIDODRINE HCL 5 MG PO TABS
5.0000 mg | ORAL_TABLET | Freq: Two times a day (BID) | ORAL | Status: DC
  Administered 2017-12-18 – 2017-12-20 (×6): 5 mg via ORAL
  Filled 2017-12-18 (×7): qty 1

## 2017-12-18 MED ORDER — CARBIDOPA-LEVODOPA ER 50-200 MG PO TBCR
1.0000 | EXTENDED_RELEASE_TABLET | Freq: Four times a day (QID) | ORAL | Status: DC
  Administered 2017-12-18 – 2017-12-20 (×11): 1 via ORAL
  Filled 2017-12-18 (×12): qty 1

## 2017-12-18 MED ORDER — ENOXAPARIN SODIUM 40 MG/0.4ML ~~LOC~~ SOLN
40.0000 mg | SUBCUTANEOUS | Status: DC
  Administered 2017-12-18 – 2017-12-20 (×3): 40 mg via SUBCUTANEOUS
  Filled 2017-12-18 (×3): qty 0.4

## 2017-12-18 MED ORDER — CARVEDILOL 3.125 MG PO TABS
3.1250 mg | ORAL_TABLET | Freq: Every day | ORAL | Status: DC
  Administered 2017-12-18 – 2017-12-20 (×3): 3.125 mg via ORAL
  Filled 2017-12-18 (×3): qty 1

## 2017-12-18 MED ORDER — CALCIUM CARBONATE 1250 (500 CA) MG PO TABS
600.0000 mg | ORAL_TABLET | Freq: Every day | ORAL | Status: DC
  Administered 2017-12-18 – 2017-12-20 (×3): 625 mg via ORAL
  Filled 2017-12-18 (×3): qty 1

## 2017-12-18 MED ORDER — ATORVASTATIN CALCIUM 40 MG PO TABS
40.0000 mg | ORAL_TABLET | Freq: Every day | ORAL | Status: DC
  Administered 2017-12-18 – 2017-12-20 (×3): 40 mg via ORAL
  Filled 2017-12-18 (×3): qty 1

## 2017-12-18 MED ORDER — SODIUM CHLORIDE 0.9 % IV SOLN: 1000.0000 mL | INTRAVENOUS | Status: DC

## 2017-12-18 MED ORDER — ONDANSETRON HCL 4 MG/2ML IJ SOLN: 4.0000 mg | Freq: Four times a day (QID) | INTRAMUSCULAR | Status: DC | PRN

## 2017-12-18 MED ORDER — CHOLESTYRAMINE LIGHT 4 G PO PACK
4.0000 g | PACK | Freq: Two times a day (BID) | ORAL | Status: DC
  Administered 2017-12-18 – 2017-12-20 (×6): 4 g via ORAL
  Filled 2017-12-18 (×6): qty 1

## 2017-12-18 MED ORDER — PANTOPRAZOLE SODIUM 40 MG PO TBEC
40.0000 mg | DELAYED_RELEASE_TABLET | Freq: Every day | ORAL | Status: DC
  Administered 2017-12-18 – 2017-12-20 (×3): 40 mg via ORAL
  Filled 2017-12-18 (×3): qty 1

## 2017-12-18 MED ORDER — IPRATROPIUM-ALBUTEROL 0.5-2.5 (3) MG/3ML IN SOLN: 3.0000 mL | Freq: Four times a day (QID) | RESPIRATORY_TRACT | Status: DC | PRN

## 2017-12-18 MED ORDER — SODIUM CHLORIDE 0.9% FLUSH
3.0000 mL | Freq: Two times a day (BID) | INTRAVENOUS | Status: DC
  Administered 2017-12-18 – 2017-12-19 (×4): 3 mL via INTRAVENOUS

## 2017-12-18 MED ORDER — ACETAMINOPHEN 325 MG PO TABS
650.0000 mg | ORAL_TABLET | Freq: Four times a day (QID) | ORAL | Status: DC | PRN
  Administered 2017-12-19 – 2017-12-20 (×2): 650 mg via ORAL
  Filled 2017-12-18 (×2): qty 2

## 2017-12-18 MED ORDER — FLUTICASONE FUROATE-VILANTEROL 200-25 MCG/INH IN AEPB
1.0000 | INHALATION_SPRAY | Freq: Every day | RESPIRATORY_TRACT | Status: DC
  Administered 2017-12-18 – 2017-12-20 (×3): 1 via RESPIRATORY_TRACT
  Filled 2017-12-18: qty 28

## 2017-12-18 MED ORDER — ONDANSETRON HCL 4 MG PO TABS: 4.0000 mg | ORAL_TABLET | Freq: Four times a day (QID) | ORAL | Status: DC | PRN

## 2017-12-18 MED ORDER — IPRATROPIUM BROMIDE 0.03 % NA SOLN
2.0000 | Freq: Three times a day (TID) | NASAL | Status: DC
  Administered 2017-12-18 – 2017-12-19 (×4): 2 via NASAL
  Filled 2017-12-18: qty 30

## 2017-12-18 NOTE — Progress Notes (Signed)
Report received from [place], [place]. Awaiting pt arrival.

## 2017-12-18 NOTE — Progress Notes (Signed)
PROGRESS NOTE  [name] [name] HWE:[id number] DOB: 11-29-1928 DOA: 12/17/2017 PCP: Shon Baton, MD  HPI/Recap of past 24 hours: [name] [name] is a 84 y.o. male with medical history significant of HTN, HLD, diastolic CHF, CAD s/p stent, Parkinson's disease, and Crohn's disease; who presents after having a fall. At baseline ambulates with a walker due to worsening gait issues related to Parkinson's, but he was not reportedly using a walker at this time. Have been seen by his primary care provider and started on doxycycline today, but had developed fever and came into the emergency department for further evaluation.  Due to redness of the right leg.  He had previously completed a 10-day course of antibiotics thought to be Augmentin 1-2 weeks earlier with resolution of symptoms.  Admitted for sepsis secondary to cellulitis  Assessment/Plan: Principal Problem:   Sepsis due to cellulitis [facility]) Active Problems:   Essential hypertension   Crohn's disease ([place])   Chronic diastolic CHF (congestive heart failure) (HCC)   Coronary artery disease   Parkinson disease (HCC)  Sepsis secondary to moderate right lower extremity cellulitis Failed outpatient treatment and now requiring IV antibiotics Erythema with warmth and severe tenderness noted on right lower extremity Leukocytosis persistent at 16 K, tachypnea with respiration rate of 30 and tachycardia with heart rate of 103. Awaiting results of blood cultures which were done peripherally x2 last night Repeat CBC in the morning Monitor fever curve Monitor symptomatology We will continue gentle IV hydration to avoid hypotension in the setting of sepsis  Hypokalemia Potassium 3.3 Repleted with oral potassium Repeat BMP and magnesium level in the morning  Hyperbilirubinemia Unclear etiology Total bilirubin trending down however from 2.3 to 1.8 today No sign of overt bleeding Hemoglobin appears to be stable however is trending down from 13 k to  11 K  Ambulatory dysfunction with recent fall Fall precautions Obtain orthostatic vital signs PT to assess  History of ischemic cardiomyopathy Appears stable at this time Continue cardiac medications Last 2D echo done on 2016 with normal LVEF  Coronary artery disease status post with stent in 2013 Continue aspirin and moderate intensity statin  Parkinson's disease Fall precautions Follows outpatient  Risks: High risk for decompensation due to recent diagnosis of sepsis secondary to failed outpatient treatment of RLE cellulitis, multiple comorbidities, and advanced age.  We will change the patient status from observation to inpatient as the patient will require at least 2 midnights for IV antibiotics in the setting of failed outpatient treatment for cellulitis.  In addition if the patient's blood culture would to come back positive further evaluation will be done in the inpatient setting such as 2D echo for instance.    Code Status: DNR  Family Communication: None at bedside  Disposition Plan: ALF possibly tomorrow 12/19/2017 if sepsis physiology improves   Consultants:  None  Procedures:  None  Antimicrobials:  IV ceftriaxone  DVT prophylaxis: Subcu Lovenox   Objective: Vitals:   12/18/17 0508 12/18/17 0750 12/18/17 0824 12/18/17 1422  BP: 121/61   114/62  Pulse: 99   82  Resp: (!) 25 20  18   Temp: 99 F (37.2 C)   97.7 F (36.5 C)  TempSrc:    Oral  SpO2: 98%  97% 100%  Weight: 77.4 kg     Height: 5' 9"  (1.753 m)       Intake/Output Summary (Last 24 hours) at 12/18/2017 1553 Last data filed at 12/18/2017 1236 Gross per 24 hour  Intake 563 ml  Output -  Net 563 ml   Filed Weights   12/18/17 0508  Weight: 77.4 kg    Exam:  . General: 84 y.o. year-old male well developed well nourished in no acute distress.  Alert and oriented x3. . Cardiovascular: Regular rate and rhythm with no rubs or gallops.  No thyromegaly or JVD noted.   Marland Kitchen Respiratory:  Clear to auscultation with no wheezes or rales. Good inspiratory effort. . Abdomen: Soft nontender nondistended with normal bowel sounds x4 quadrants. . Musculoskeletal: No lower extremity edema. 2/4 pulses in all 4 extremities. . Skin: Demarcated erythematous lesion with warmth and severe tenderness on palpation noted in the right lower extremity. Marland Kitchen Psychiatry: Mood is appropriate for condition and setting   Data Reviewed: CBC: Recent Labs  Lab 12/17/17 2044 12/18/17 0533  WBC 18.5* 16.9*  NEUTROABS 16.7*  --   HGB 13.1 11.3*  HCT 39.7 34.0*  MCV 94.7 93.9  PLT 232 008   Basic Metabolic Panel: Recent Labs  Lab 12/17/17 2044 12/18/17 0533  NA 141 138  K 3.7 3.3*  CL 103 105  CO2 24 21*  GLUCOSE 136* 113*  BUN 23 15  CREATININE 1.18 0.97  CALCIUM 9.1 8.0*   GFR: Estimated Creatinine Clearance: 54.7 mL/min (by C-G formula based on SCr of 0.97 mg/dL). Liver Function Tests: Recent Labs  Lab 12/17/17 2044 12/18/17 0533  AST 28 21  ALT 7 13  ALKPHOS 83 65  BILITOT 2.3* 1.8*  PROT 8.6* 6.7  ALBUMIN 4.3 3.3*   No results for input(s): LIPASE, AMYLASE in the last 168 hours. No results for input(s): AMMONIA in the last 168 hours. Coagulation Profile: Recent Labs  Lab 12/17/17 2044  INR 1.03   Cardiac Enzymes: No results for input(s): CKTOTAL, CKMB, CKMBINDEX, TROPONINI in the last 168 hours. BNP (last 3 results) No results for input(s): PROBNP in the last 8760 hours. HbA1C: No results for input(s): HGBA1C in the last 72 hours. CBG: No results for input(s): GLUCAP in the last 168 hours. Lipid Profile: No results for input(s): CHOL, HDL, LDLCALC, TRIG, CHOLHDL, LDLDIRECT in the last 72 hours. Thyroid Function Tests: No results for input(s): TSH, T4TOTAL, FREET4, T3FREE, THYROIDAB in the last 72 hours. Anemia Panel: No results for input(s): VITAMINB12, FOLATE, FERRITIN, TIBC, IRON, RETICCTPCT in the last 72 hours. Urine analysis:    Component Value Date/Time    COLORURINE YELLOW 12/17/2017 [place] 12/17/2017 2224   LABSPEC 1.018 12/17/2017 2224   PHURINE 5.0 12/17/2017 2224   GLUCOSEU NEGATIVE 12/17/2017 2224   HGBUR NEGATIVE 12/17/2017 2224   BILIRUBINUR NEGATIVE 12/17/2017 2224   KETONESUR 20 (A) 12/17/2017 2224   PROTEINUR NEGATIVE 12/17/2017 2224   UROBILINOGEN 0.2 12/12/2015 2016   NITRITE NEGATIVE 12/17/2017 2224   LEUKOCYTESUR NEGATIVE 12/17/2017 2224   Sepsis Labs: @LABRCNTIP (procalcitonin:4,lacticidven:4)  ) Recent Results (from the past 240 hour(s))  Culture, blood (Routine x 2)     Status: None (Preliminary result)   Collection Time: 12/17/17  8:44 PM  Result Value Ref Range Status   Specimen Description   Final    BLOOD RIGHT ANTECUBITAL Performed at [facility], [place] [address], [place], [place] [address]    Special Requests   Final    BOTTLES DRAWN AEROBIC AND ANAEROBIC Blood Culture adequate volume Performed at [facility] [address], [place], [place] [address]    Culture   Final    NO GROWTH < 24 HOURS Performed at [facility], [place] [address] [place], [place] [address]    Report Status PENDING  Incomplete  Culture, blood (Routine x 2)     Status: None (Preliminary result)   Collection Time: 12/17/17  8:44 PM  Result Value Ref Range Status   Specimen Description   Final    BLOOD LEFT ANTECUBITAL Performed at [facility] [address], [place], [place] [address]    Special Requests   Final    BOTTLES DRAWN AEROBIC AND ANAEROBIC Blood Culture adequate volume Performed at [facility] [address] [place], [place] [address]    Culture   Final    NO GROWTH < 24 HOURS Performed at [facility] [address] [place], [place] [address]    Report Status PENDING  Incomplete  MRSA PCR Screening     Status: None   Collection Time: 12/18/17  6:29 AM  Result Value Ref Range Status   MRSA by PCR  NEGATIVE NEGATIVE Final    Comment:        The GeneXpert MRSA Assay (FDA approved for NASAL specimens only), is one component of a comprehensive MRSA colonization surveillance program. It is not intended to diagnose MRSA infection nor to guide or monitor treatment for MRSA infections. Performed at [facility], [place] [address], [place], [place] [address]       Studies: Dg Chest 2 View  Result Date: 12/17/2017 CLINICAL DATA:  Fever EXAM: CHEST - 2 VIEW COMPARISON:  08/14/2017 FINDINGS: The heart size and mediastinal contours are within normal limits. Both lungs are clear. Degenerative spurring of the spine. IMPRESSION: No active cardiopulmonary disease. Electronically Signed   By: Donavan Foil M.D.   On: 12/17/2017 21:59   Dg Hip Unilat W Or Wo Pelvis 2-3 Views Right  Result Date: 12/17/2017 CLINICAL DATA:  Fall with hip pain EXAM: DG HIP (WITH OR WITHOUT PELVIS) 2-3V RIGHT COMPARISON:  CT 11/21/2017 FINDINGS: Postsurgical changes in the lumbosacral spine. Mild SI joint degenerative changes. Pubic symphysis and rami are intact. No fracture or malalignment. IMPRESSION: No acute osseous abnormality. Electronically Signed   By: Donavan Foil M.D.   On: 12/17/2017 22:00    Scheduled Meds: . aspirin EC  81 mg Oral Daily  . atorvastatin  40 mg Oral Daily  . calcium carbonate  625 mg Oral Q breakfast  . carbidopa-levodopa  1 tablet Oral QID  . carvedilol  3.125 mg Oral Daily  . cholestyramine  4 g Oral BID  . donepezil  10 mg Oral QHS  . enoxaparin (LOVENOX) injection  40 mg Subcutaneous Q24H  . fluticasone furoate-vilanterol  1 puff Inhalation Daily  . ipratropium  2 spray Each Nare TID  . midodrine  5 mg Oral BID WC  . pantoprazole  40 mg Oral Daily  . potassium chloride SA  40 mEq Oral Daily  . sodium chloride flush  3 mL Intravenous Q12H  . torsemide  20 mg Oral Daily    Continuous Infusions: . cefTRIAXone (ROCEPHIN)  IV Stopped (12/17/17 2248)     LOS:  0 days     Kayleen Memos, MD [facility] Pager [phone]  If 7PM-7AM, please contact night-coverage [email or web address] Password [email or web address] 12/18/2017, 3:53 PM

## 2017-12-18 NOTE — ED Notes (Addendum)
Report given to RN for [phone]

## 2017-12-18 NOTE — Progress Notes (Signed)
Pt arrived to 1516. Pt oriented to room , staff, rounding, call bell, fall precautions. System went down during arrival, admission not completed.

## 2017-12-18 NOTE — ED Notes (Signed)
ED TO INPATIENT HANDOFF REPORT  Name/Age/Gender [name] [name] 84 y.o. male  Code Status    Code Status Orders  (From admission, onward)         Start     Ordered   12/18/17 0001  Do not attempt resuscitation (DNR)  Continuous    Question Answer Comment  In the event of cardiac or respiratory ARREST Do not call a "code blue"   In the event of cardiac or respiratory ARREST Do not perform Intubation, CPR, defibrillation or ACLS   In the event of cardiac or respiratory ARREST Use medication by any route, position, wound care, and other measures to relive pain and suffering. May use oxygen, suction and manual treatment of airway obstruction as needed for comfort.      12/18/17 0000        Code Status History    Date Active Date Inactive Code Status Order ID Comments User Context   11/11/2014 1323 11/14/2014 1955 DNR [id number]  Donne Hazel, MD Inpatient   11/11/2014 0200 11/11/2014 1323 Full Code [id number]  Modena Jansky, MD Inpatient   09/28/2012 1957 10/02/2012 1729 Full Code [id number]  [name], MD Inpatient   06/02/2011 2151 06/06/2011 1821 Full Code [id number]  Colin Broach, RN Inpatient      Home/SNF/Other Nursing Home  Chief Complaint fever, weakness  Level of Care/Admitting Diagnosis ED Disposition    ED Disposition Condition [place]: [facility] [[id number]]  Level of Care: Telemetry [5]  Admit to tele based on following criteria: Complex arrhythmia (Bradycardia/Tachycardia)  Diagnosis: Sepsis due to cellulitis [facility]) [[id number]]  Admitting Physician: Norval Morton [[id number]]  Attending Physician: Norval Morton [[id number]]  PT Class (Do Not Modify): Observation [104]  PT Acc Code (Do Not Modify): Observation [10022]       Medical History Past Medical History:  Diagnosis Date  . Cellulitis   . Chronic diastolic CHF (congestive heart failure) (HCC)    a. EF initially 35-40% after MI 1/09; b. echo 7/08: EF 60%;   c. 11/2014 Echo: EF 65-70%, Gr 1 DD, mild MR.  . Coronary artery disease    a. s/p anterior STEMI 04/2007 with BMS-> LAD;  b. Cath 12/2011 patent stent;  c. low risk nuc 04/2014.  . Crohn's disease ([place])   . Diaphragmatic hernia without mention of obstruction or gangrene   . Diverticulosis of colon (without mention of hemorrhage)   . Fatty liver    a. on ultrasound of 10/2009  . Flatulence, eructation, and gas pain   . Fungal infection   . Gait disorder   . GERD (gastroesophageal reflux disease)   . HOH (hard of hearing)    Hearing aids  . Hyperlipidemia   . Hypertension   . Ischemic cardiomyopathy    a. EF initially 35-40% after MI 1/09; b. echo 7/08: EF 60%;  c. 11/2014 Echo: EF 65-70%, Gr 1 DD, mild MR.  . Melanoma ([place])   . Memory disorder   . Orthostatic hypotension   . Osteoporosis   . Other chronic nonalcoholic liver disease   . Other esophagitis   . Parkinson's disease ([place])   . RBBB 09/02/2013  . Regional enteritis of small intestine ([place])   . Renal calculi   . Restless leg syndrome 03/22/2015  . Ulcerative (chronic) ileocolitis ([place])   . Urinary incontinence     Allergies No Known Allergies  IV Location/Drains/Wounds Patient Lines/Drains/Airways Status   Active  Line/Drains/Airways    Name:   Placement date:   Placement time:   Site:   Days:   Peripheral IV 12/17/17 Right Antecubital   12/17/17    2059    Antecubital   1   Peripheral IV 12/17/17 Left Antecubital   12/17/17    2059    Antecubital   1   Pressure Ulcer 11/11/14 Stage I -  Intact skin with non-blanchable redness of a localized area usually over a bony prominence.   11/11/14    0229     1133          Labs/Imaging Results for orders placed or performed during the hospital encounter of 12/17/17 (from the past 48 hour(s))  Lactic acid, plasma     Status: None   Collection Time: 12/17/17  8:23 PM  Result Value Ref Range   Lactic Acid, Venous 1.3 0.5 - 1.9 mmol/L    Comment: Performed at [facility], [place] [address] [place], [place] [address]  Comprehensive metabolic panel     Status: Abnormal   Collection Time: 12/17/17  8:44 PM  Result Value Ref Range   Sodium 141 135 - 145 mmol/L   Potassium 3.7 3.5 - 5.1 mmol/L   Chloride 103 98 - 111 mmol/L   CO2 24 22 - 32 mmol/L   Glucose, Bld 136 (H) 70 - 99 mg/dL   BUN 23 8 - 23 mg/dL   Creatinine, Ser 1.18 0.61 - 1.24 mg/dL   Calcium 9.1 8.9 - 10.3 mg/dL   Total Protein 8.6 (H) 6.5 - 8.1 g/dL   Albumin 4.3 3.5 - 5.0 g/dL   AST 28 15 - 41 U/L   ALT 7 0 - 44 U/L   Alkaline Phosphatase 83 38 - 126 U/L   Total Bilirubin 2.3 (H) 0.3 - 1.2 mg/dL   GFR calc non Af Amer 54 (L) >60 mL/min   GFR calc Af Amer >60 >60 mL/min    Comment: (NOTE) The eGFR has been calculated using the CKD EPI equation. This calculation has not been validated in all clinical situations. eGFR's persistently <60 mL/min signify possible Chronic Kidney Disease.    Anion gap 14 5 - 15    Comment: Performed at [facility], [place] [address], [place], [place] [address]  CBC with Differential     Status: Abnormal   Collection Time: 12/17/17  8:44 PM  Result Value Ref Range   WBC 18.5 (H) 4.0 - 10.5 K/uL   RBC 4.19 (L) 4.22 - 5.81 MIL/uL   Hemoglobin 13.1 13.0 - 17.0 g/dL   HCT 39.7 39.0 - 52.0 %   MCV 94.7 78.0 - 100.0 fL   MCH 31.3 26.0 - 34.0 pg   MCHC 33.0 30.0 - 36.0 g/dL   RDW 14.1 11.5 - 15.5 %   Platelets 232 150 - 400 K/uL   Neutrophils Relative % 91 %   Neutro Abs 16.7 (H) 1.7 - 7.7 K/uL   Lymphocytes Relative 5 %   Lymphs Abs 1.0 0.7 - 4.0 K/uL   Monocytes Relative 4 %   Monocytes Absolute 0.8 0.1 - 1.0 K/uL   Eosinophils Relative 0 %   Eosinophils Absolute 0.0 0.0 - 0.7 K/uL   Basophils Relative 0 %   Basophils Absolute 0.0 0.0 - 0.1 K/uL    Comment: Performed at [facility], [place] [address], [place], [place] [address]  Protime-INR     Status: None   Collection Time:  12/17/17  8:44 PM   Result Value Ref Range   Prothrombin Time 13.4 11.4 - 15.2 seconds   INR 1.03     Comment: Performed at [facility], [place] [address] [place], [place] [address]  Lactic acid, plasma     Status: None   Collection Time: 12/17/17  8:44 PM  Result Value Ref Range   Lactic Acid, Venous 1.2 0.5 - 1.9 mmol/L    Comment: Performed at [facility], [place] [address] [place], [place] [address]  I-Stat CG4 Lactic Acid, ED     Status: None   Collection Time: 12/17/17  8:47 PM  Result Value Ref Range   Lactic Acid, Venous 1.28 0.5 - 1.9 mmol/L  Urinalysis, Routine w reflex microscopic     Status: Abnormal   Collection Time: 12/17/17 10:24 PM  Result Value Ref Range   Color, Urine YELLOW YELLOW   APPearance CLEAR CLEAR   Specific Gravity, Urine 1.018 1.005 - 1.030   pH 5.0 5.0 - 8.0   Glucose, UA NEGATIVE NEGATIVE mg/dL   Hgb urine dipstick NEGATIVE NEGATIVE   Bilirubin Urine NEGATIVE NEGATIVE   Ketones, ur 20 (A) NEGATIVE mg/dL   Protein, ur NEGATIVE NEGATIVE mg/dL   Nitrite NEGATIVE NEGATIVE   Leukocytes, UA NEGATIVE NEGATIVE    Comment: Performed at [place] [address], [place],  [address]  I-Stat CG4 Lactic Acid, ED  (not at  [facility])     Status: None   Collection Time: 12/17/17 11:52 PM  Result Value Ref Range   Lactic Acid, Venous 1.21 0.5 - 1.9 mmol/L   Dg Chest 2 View  Result Date: 12/17/2017 CLINICAL DATA:  Fever EXAM: CHEST - 2 VIEW COMPARISON:  08/14/2017 FINDINGS: The heart size and mediastinal contours are within normal limits. Both lungs are clear. Degenerative spurring of the spine. IMPRESSION: No active cardiopulmonary disease. Electronically Signed   By: Donavan Foil M.D.   On: 12/17/2017 21:59   Dg Hip Unilat W Or Wo Pelvis 2-3 Views Right  Result Date: 12/17/2017 CLINICAL DATA:  Fall with hip pain EXAM: DG HIP (WITH OR WITHOUT PELVIS) 2-3V RIGHT COMPARISON:  CT 11/21/2017 FINDINGS: Postsurgical changes  in the lumbosacral spine. Mild SI joint degenerative changes. Pubic symphysis and rami are intact. No fracture or malalignment. IMPRESSION: No acute osseous abnormality. Electronically Signed   By: Donavan Foil M.D.   On: 12/17/2017 22:00    Pending Labs Unresulted Labs (From admission, onward)    Start     Ordered   12/18/17 0500  CBC  Tomorrow morning,   R     12/18/17 0000   12/18/17 0500  Comprehensive metabolic panel  Tomorrow morning,   R     12/18/17 0000   12/17/17 2023  Culture, blood (Routine x 2)  BLOOD CULTURE X 2,   STAT     12/17/17 2023   12/17/17 2023  Urine culture  STAT,   STAT     12/17/17 2023          Vitals/Pain Today's Vitals   12/17/17 2021 12/17/17 2150 12/17/17 2245 12/17/17 2353  BP: 129/73     Pulse: (!) 122  (!) 101   Resp: (!) 32  (!) 22   Temp: 99.1 F (37.3 C)   98.7 F (37.1 C)  TempSrc: Oral   Oral  SpO2: 98%  100%   PainSc:  2       Isolation Precautions No active isolations  Medications Medications  cefTRIAXone (ROCEPHIN) 2 g in sodium chloride 0.9 % 100 mL IVPB (0 g Intravenous Stopped 12/17/17 2248)  sodium chloride 0.9 % bolus 1,000 mL (0 mLs Intravenous Stopped 12/17/17 2322)    Followed by  0.9 %  sodium chloride infusion (1,000 mLs Intravenous New Bag/Given 12/17/17 2146)  carvedilol (COREG) tablet 3.125 mg (has no administration in time range)  aspirin EC tablet 81 mg (has no administration in time range)  cholestyramine (QUESTRAN) packet 4 g (has no administration in time range)  fluticasone furoate-vilanterol (BREO ELLIPTA) 200-25 MCG/INH 1 puff (has no administration in time range)  carbidopa-levodopa (SINEMET CR) 50-200 MG per tablet controlled release 1 tablet (has no administration in time range)  ipratropium (ATROVENT) 0.03 % nasal spray 2 spray (has no administration in time range)  donepezil (ARICEPT) tablet 10 mg (has no administration in time range)  enoxaparin (LOVENOX) injection 40 mg (has no administration in time  range)  sodium chloride flush (NS) 0.9 % injection 3 mL (has no administration in time range)  ondansetron (ZOFRAN) tablet 4 mg (has no administration in time range)    Or  ondansetron (ZOFRAN) injection 4 mg (has no administration in time range)  ipratropium-albuterol (DUONEB) 0.5-2.5 (3) MG/3ML nebulizer solution 3 mL (has no administration in time range)    Mobility walks

## 2017-12-18 NOTE — Consult Note (Signed)
[employer] Nurse wound consult note Reason for Consult:callous on 3rd toe on right foot Wound type:callous/full thickness Pressure Injury POA: NA Measurement: 1cm x 0.6cm unknown depth Wound bed:50% black, 50% dried white scaly callous Drainage (amount, consistency, odor) none Periwound: dry but intact Dressing procedure/placement/frequency: I have provided nurses with orders for Betadine BID to callous on toe, let air dry. Pt has cellulitis of right lateral leg, no open wound, no topical treatment needed. We will not follow, but will remain available to this patient, to nursing, and the medical and/or surgical teams.  Please re-consult if we need to assist further.  Fara Olden, RN-C, WTA-C, [place] Wound Treatment Associate Ostomy Care Associate

## 2017-12-18 NOTE — Progress Notes (Signed)
CSW consulted-patient from facility.  Patient from [place] [place] [facility].  CSW will follow for social work needs.    Kathrin Greathouse, Marlinda Mike, MSW Clinical Social Worker  [phone] 12/18/2017  3:35 PM

## 2017-12-19 LAB — CBC
HCT: 36.5 % — ABNORMAL LOW (ref 39.0–52.0)
Hemoglobin: 12.1 g/dL — ABNORMAL LOW (ref 13.0–17.0)
MCH: 31.6 pg (ref 26.0–34.0)
MCHC: 33.2 g/dL (ref 30.0–36.0)
MCV: 95.3 fL (ref 78.0–100.0)
Platelets: 201 10*3/uL (ref 150–400)
RBC: 3.83 MIL/uL — ABNORMAL LOW (ref 4.22–5.81)
RDW: 14.5 % (ref 11.5–15.5)
WBC: 12 10*3/uL — ABNORMAL HIGH (ref 4.0–10.5)

## 2017-12-19 LAB — BASIC METABOLIC PANEL
Anion gap: 15 (ref 5–15)
BUN: 18 mg/dL (ref 8–23)
CO2: 22 mmol/L (ref 22–32)
Calcium: 8.3 mg/dL — ABNORMAL LOW (ref 8.9–10.3)
Chloride: 100 mmol/L (ref 98–111)
Creatinine, Ser: 1.03 mg/dL (ref 0.61–1.24)
GFR calc Af Amer: >60 mL/min (ref >60)
GFR calc non Af Amer: >60 mL/min (ref >60)
Glucose, Bld: 116 mg/dL — ABNORMAL HIGH (ref 70–99)
Potassium: 3.4 mmol/L — ABNORMAL LOW (ref 3.5–5.1)
Sodium: 137 mmol/L (ref 135–145)

## 2017-12-19 LAB — MAGNESIUM: Magnesium: 1.1 mg/dL — ABNORMAL LOW (ref 1.7–2.4)

## 2017-12-19 LAB — URINE CULTURE: Culture: NO GROWTH

## 2017-12-19 MED ORDER — POTASSIUM CHLORIDE CRYS ER 20 MEQ PO TBCR
40.0000 meq | EXTENDED_RELEASE_TABLET | Freq: Once | ORAL | Status: AC
  Administered 2017-12-19: 40 meq via ORAL
  Filled 2017-12-19: qty 2

## 2017-12-19 MED ORDER — MAGNESIUM SULFATE 4 GM/100ML IV SOLN
4.0000 g | Freq: Once | INTRAVENOUS | Status: AC
  Administered 2017-12-19: 4 g via INTRAVENOUS
  Filled 2017-12-19: qty 100

## 2017-12-19 NOTE — NC FL2 (Signed)
[place] LEVEL OF CARE SCREENING TOOL     IDENTIFICATION  Patient Name: [name] [name] Birthdate: 1929/01/22 Sex: male Admission Date (Current Location): 12/17/2017  [facility] and [place] Number:  Herbalist and Address:  [facility],  [place] [address] [place]      Provider Number: [id number]  Attending Physician Name and Address:  Kayleen Memos, DO  Relative Name and Phone Number:       Current Level of Care: Hospital Recommended Level of Care: [place] Prior Approval Number:    Date Approved/Denied:   PASRR Number:    Discharge Plan: SNF    Current Diagnoses: Patient Active Problem List   Diagnosis Date Noted  . Sepsis due to cellulitis ([place]) 12/18/2017  . Restless leg syndrome 03/22/2015  . Parkinson disease ([place]) 12/30/2014  . Alternaria infection ([place]) 12/29/2014  . Ischemic cardiomyopathy   . Coronary artery disease   . Chronic diastolic CHF (congestive heart failure) ([place])   . NASH (nonalcoholic steatohepatitis) 11/20/2014  . Peripheral edema   . Dizziness 11/11/2014  . Pressure ulcer 11/11/2014  . Orthostatic hypotension   . Dyspnea on exertion 04/22/2014  . Dehydration 09/28/2012  . Acute kidney injury ([place]) 09/28/2012  . Memory loss 08/19/2012  . Paralysis agitans ([place]) 08/19/2012  . Abnormality of gait 08/19/2012  . Dysphagia 05/24/2011  . Tinea corporis 01/04/2011  . Angina of effort ([place]) 12/14/2010  . Fatigue 12/14/2010  . NOSEBLEED 05/23/2010  . Hyperlipidemia 04/29/2007  . Essential hypertension 04/29/2007  . GERD 04/29/2007  . Crohn's disease ([place]) 04/29/2007  . SMALL BOWEL OBSTRUCTION 04/29/2007  . DIVERTICULOSIS, COLON 04/29/2007  . FATTY LIVER DISEASE 08/30/2006  . HEMORRHOIDS 03/26/2005  . Erosive esophagitis 09/29/1997  . HIATAL HERNIA 09/29/1997    Orientation RESPIRATION BLADDER Height & Weight     Self, Time, Situation, Place  Normal Continent Weight: 172 lb  2.9 oz (78.1 kg) Height:  5' 9"  (175.3 cm)  BEHAVIORAL SYMPTOMS/MOOD NEUROLOGICAL BOWEL NUTRITION STATUS      Continent Diet(Heart Healthy )  AMBULATORY STATUS COMMUNICATION OF NEEDS Skin   Limited Assist Verbally Normal                       Personal Care Assistance Level of Assistance  Bathing, Feeding, Dressing Bathing Assistance: Independent Feeding assistance: Independent Dressing Assistance: Independent     Functional Limitations Info  Sight, Speech, Hearing Sight Info: Adequate Hearing Info: Adequate Speech Info: Adequate    SPECIAL CARE FACTORS FREQUENCY  PT (By licensed PT), OT (By licensed OT)     PT Frequency: 5x/week OT Frequency: 5x/week             Contractures Contractures Info: Not present    Additional Factors Info  Code Status, Allergies Code Status Info: DNR Allergies Info: Allergies: No Known Allergies           Current Medications (12/19/2017):  This is the current hospital active medication list Current Facility-Administered Medications  Medication Dose Route Frequency Provider Last Rate Last Dose  . acetaminophen (TYLENOL) tablet 650 mg  650 mg Oral Q6H PRN Irene Pap N, DO      . aspirin EC tablet 81 mg  81 mg Oral Daily Smith, Rondell A, MD   81 mg at 12/19/17 1009  . atorvastatin (LIPITOR) tablet 40 mg  40 mg Oral Daily Tamala Julian, Rondell A, MD   40 mg at 12/19/17 1009  . calcium carbonate (  OS-CAL - dosed in mg of elemental calcium) tablet 625 mg  625 mg Oral Q breakfast Tamala Julian, Rondell A, MD   625 mg at 12/19/17 0830  . carbidopa-levodopa (SINEMET CR) 50-200 MG per tablet controlled release 1 tablet  1 tablet Oral QID Fuller Plan A, MD   1 tablet at 12/19/17 1356  . carvedilol (COREG) tablet 3.125 mg  3.125 mg Oral Daily Smith, Rondell A, MD   3.125 mg at 12/19/17 1009  . cefTRIAXone (ROCEPHIN) 2 g in sodium chloride 0.9 % 100 mL IVPB  2 g Intravenous Q24H Norval Morton, MD   Stopped at 12/18/17 2157  . cholestyramine light  (PREVALITE) packet 4 g  4 g Oral BID Fuller Plan A, MD   4 g at 12/19/17 1009  . donepezil (ARICEPT) tablet 10 mg  10 mg Oral QHS Fuller Plan A, MD   10 mg at 12/18/17 2122  . enoxaparin (LOVENOX) injection 40 mg  40 mg Subcutaneous Q24H Smith, Rondell A, MD   40 mg at 12/19/17 1010  . fluticasone furoate-vilanterol (BREO ELLIPTA) 200-25 MCG/INH 1 puff  1 puff Inhalation Daily Fuller Plan A, MD   1 puff at 12/19/17 0848  . ipratropium (ATROVENT) 0.03 % nasal spray 2 spray  2 spray Each Nare TID Norval Morton, MD   2 spray at 12/18/17 2127  . ipratropium-albuterol (DUONEB) 0.5-2.5 (3) MG/3ML nebulizer solution 3 mL  3 mL Nebulization Q6H PRN Smith, Rondell A, MD      . loperamide (IMODIUM) capsule 2 mg  2 mg Oral BID PRN Fuller Plan A, MD   2 mg at 12/18/17 1139  . midodrine (PROAMATINE) tablet 5 mg  5 mg Oral BID WC Smith, Rondell A, MD   5 mg at 12/19/17 0829  . ondansetron (ZOFRAN) tablet 4 mg  4 mg Oral Q6H PRN Fuller Plan A, MD       Or  . ondansetron (ZOFRAN) injection 4 mg  4 mg Intravenous Q6H PRN Smith, Rondell A, MD      . pantoprazole (PROTONIX) EC tablet 40 mg  40 mg Oral Daily Smith, Rondell A, MD   40 mg at 12/19/17 1009  . potassium chloride SA (K-DUR,KLOR-CON) CR tablet 40 mEq  40 mEq Oral Daily Fuller Plan A, MD   40 mEq at 12/18/17 0953  . sodium chloride flush (NS) 0.9 % injection 3 mL  3 mL Intravenous Q12H Smith, Rondell A, MD   3 mL at 12/19/17 1016  . torsemide (DEMADEX) tablet 20 mg  20 mg Oral Daily Fuller Plan A, MD   20 mg at 12/19/17 0830     Discharge Medications: Please see discharge summary for a list of discharge medications.  Relevant Imaging Results:  Relevant Lab Results:   Additional Information ssn:[id number]  Lia Hopping, LCSW

## 2017-12-19 NOTE — Care Management (Signed)
  Discharge Readiness Milestones  (1st Day of chart review)  MCG Used: cellulitis Optimal GLOS: 2 Hospital Day: 1  Discharge Readiness Milestones Hospitalization Return to top of Cellulitis RRG - ISC Goal Length of Stay: Ambulatory or 2 days  Note: Goal Length of Stay assumes optimal recovery, decision making, and care. Patients may be discharged to a lower level of care (either later than or sooner than the goal) when it is appropriate for their clinical status and care needs. Discharge Readiness Return to top of Cellulitis RRG - [place]  Discharge readiness is indicated by patient meeting Recovery Milestones, including ALL of the following: ? Hemodynamic stability ? Mental status at baseline ? Fever absent or improved ? Antibiotic treatment  Iv rocephine ? Pain absent or manageable at next level of care ? Ambulatory ? Oral hydration, medications,[M] and diet  Identified Barriers: Date/Name case discussed with attending MD (if applicable):  Date/name case discussed with supervisor (if applicable):  Date/name case referred to physician advisor (if applicable)   Concurrent Review - Discharge Readiness Milestones, Not Met

## 2017-12-19 NOTE — Evaluation (Signed)
Physical Therapy Evaluation Patient Details Name: [name] [name] MRN: [id number] DOB: December 25, 1928 Today's Date: 12/19/2017   History of Present Illness  84 y.o. male with medical history significant of HTN, HLD, diastolic CHF, CAD s/p stent, Parkinson's disease, and Crohn's disease; who presents after having a fall and admitted for Sepsis secondary to moderate right lower extremity cellulitis  Clinical Impression  Pt admitted with above diagnosis. Pt currently with functional limitations due to the deficits listed below (see PT Problem List).  Pt will benefit from skilled PT to increase their independence and safety with mobility to allow discharge to the venue listed below.  Pt assisted with ambulating short distance in hallway and set up in recliner end of session.  Pt from ILF and son reports pt will be evaluated upon return and likely placed in SNF section prior to return to ILF.  Pt is high fall risk.     Follow Up Recommendations SNF    Equipment Recommendations  None recommended by PT    Recommendations for Other Services       Precautions / Restrictions Precautions Precautions: Fall Restrictions Weight Bearing Restrictions: No      Mobility  Bed Mobility Overal bed mobility: Needs Assistance Bed Mobility: Supine to Sit     Supine to sit: Min assist     General bed mobility comments: assist for trunk upright  Transfers Overall transfer level: Needs assistance Equipment used: Rolling walker (2 wheeled) Transfers: Sit to/from Stand Sit to Stand: Min assist         General transfer comment: verbal cues for hand placement, assist to rise and steady  Ambulation/Gait Ambulation/Gait assistance: Min assist Gait Distance (Feet): 120 Feet Assistive device: Rolling walker (2 wheeled) Gait Pattern/deviations: Step-through pattern;Decreased stride length;Shuffle     General Gait Details: assist for RW negotiating, cues for "big steps" and distance to  tolerance  Stairs            Wheelchair Mobility    Modified Rankin (Stroke Patients Only)       Balance Overall balance assessment: History of Falls                                           Pertinent Vitals/Pain Pain Assessment: No/denies pain    Home Living       Type of Home: Independent living facility       Home Layout: One level Home Equipment: Environmental consultant - 2 wheels      Prior Function Level of Independence: Needs assistance   Gait / Transfers Assistance Needed: ambulatory, starting to use RW  ADL's / Homemaking Assistance Needed: son reports pt has required more assist for ADLs lately with progression of Parkinsons        Hand Dominance        Extremity/Trunk Assessment        Lower Extremity Assessment Lower Extremity Assessment: Generalized weakness       Communication   Communication: No difficulties  Cognition Arousal/Alertness: Awake/alert Behavior During Therapy: WFL for tasks assessed/performed Overall Cognitive Status: Within Functional Limits for tasks assessed                                        General Comments      Exercises     Assessment/Plan  PT Assessment Patient needs continued PT services  PT Problem List Decreased strength;Decreased mobility;Decreased activity tolerance;Decreased balance;Decreased knowledge of use of DME;Decreased coordination       PT Treatment Interventions DME instruction;Therapeutic activities;Gait training;Therapeutic exercise;Patient/family education;Functional mobility training;Balance training    PT Goals (Current goals can be found in the Care Plan section)  Acute Rehab PT Goals PT Goal Formulation: With patient Time For Goal Achievement: 01/02/18 Potential to Achieve Goals: Good    Frequency Min 3X/week   Barriers to discharge        Co-evaluation               AM-PAC PT "6 Clicks" Daily Activity  Outcome Measure Difficulty  turning over in bed (including adjusting bedclothes, sheets and blankets)?: None Difficulty moving from lying on back to sitting on the side of the bed? : Unable Difficulty sitting down on and standing up from a chair with arms (e.g., wheelchair, bedside commode, etc,.)?: Unable Help needed moving to and from a bed to chair (including a wheelchair)?: A Little Help needed walking in hospital room?: A Little Help needed climbing 3-5 steps with a railing? : A Lot 6 Click Score: 14    End of Session Equipment Utilized During Treatment: Gait belt Activity Tolerance: Patient tolerated treatment well Patient left: in chair;with chair alarm set;with call bell/phone within reach;with family/visitor present Nurse Communication: Mobility status PT Visit Diagnosis: Other abnormalities of gait and mobility (R26.89)    Time: 9767-3419 PT Time Calculation (min) (ACUTE ONLY): 18 min   Charges:   PT Evaluation $PT Eval Low Complexity: Kennedy, PT, DPT Acute Rehabilitation Services Office: [phone] Pager: [phone]   Trena Platt 12/19/2017, 11:57 AM

## 2017-12-19 NOTE — Progress Notes (Addendum)
PROGRESS NOTE  [name] ALCOCER ZTI:[id number] DOB: 06/29/1928 DOA: 12/17/2017 PCP: Shon Baton, MD  HPI/Recap of past 24 hours: [name] [name] is a 84 y.o. male with medical history significant of HTN, HLD, diastolic CHF, CAD s/p stent, Parkinson's disease, and Crohn's disease; who presents after having a fall. At baseline ambulates with a walker due to worsening gait issues related to Parkinson's, but he was not reportedly using a walker at this time. Have been seen by his primary care provider and started on doxycycline today, but had developed fever and came into the emergency department for further evaluation.  Due to redness of the right leg.  He had previously completed a 10-day course of antibiotics thought to be Augmentin 1-2 weeks earlier with resolution of symptoms.  Admitted for sepsis secondary to cellulitis  12/19/2017: Patient seen and examined with his daughter and wife at his bedside.  Reports moderate tenderness on palpation of this right lower extremity with minimal touch.  Reports frequent loose stools with urinary and bowel incontinence which is not new.  On Imodium at the assisted living facility.  History of Crohn's disease.  Assessment/Plan: Principal Problem:   Sepsis due to cellulitis [facility]) Active Problems:   Essential hypertension   Crohn's disease ([place])   Chronic diastolic CHF (congestive heart failure) (HCC)   Coronary artery disease   Parkinson disease (HCC)  Sepsis secondary to moderate right lower extremity cellulitis Failed outpatient treatment and now requiring IV antibiotics Erythema with warmth and severe tenderness noted on right lower extremity Leukocytosis still persists, will benefit from additional day of IV antibiotics.  Blood cultures x2 no growth in 2 days. Obtain CBC in the morning Continue gentle IV hydration to avoid hypotension in the setting of sepsis  Improving hypokalemia Potassium 3.3 Repleted with oral potassium Repeat BMP and  magnesium level in the morning  Hyperbilirubinemia Unclear etiology Total bilirubin trending down however from 2.3 to 1.8 today No sign of overt bleeding Hemoglobin appears to be stable however is trending down from 13 k to 11 K  Ambulatory dysfunction with recent fall Fall precautions Obtain orthostatic vital signs PT to assess  History of ischemic cardiomyopathy Appears stable at this time Continue cardiac medications Last 2D echo done on 2016 with normal LVEF  Coronary artery disease status post with stent in 2013 Continue aspirin and moderate intensity statin  Crohn's disease Denies any abdominal pain but reports loose stools Continue to monitor  Parkinson's disease Fall precautions Follows outpatient  Risks: High risk for decompensation due to recent diagnosis of sepsis secondary to failed outpatient treatment of RLE cellulitis, multiple comorbidities, and advanced age.  We will change the patient status from observation to inpatient as the patient will require at least 2 midnights for IV antibiotics in the setting of failed outpatient treatment for cellulitis.  In addition if the patient's blood culture would to come back positive further evaluation will be done in the inpatient setting such as 2D echo for instance.    Code Status: DNR  Family Communication: None at bedside  Disposition Plan: ALF possibly tomorrow 12/20/2017 if sepsis physiology improves   Consultants:  None  Procedures:  None  Antimicrobials:  IV ceftriaxone  DVT prophylaxis: Subcu Lovenox   Objective: Vitals:   12/18/17 1941 12/19/17 0417 12/19/17 0848 12/19/17 1348  BP: (!) 98/57 124/71  (!) 104/57  Pulse: 80 82  74  Resp: 20 (!) 21  15  Temp: 98.4 F (36.9 C) 98.1 F (36.7 C)  97.7 F (36.5 C)  TempSrc: Oral Oral  Oral  SpO2: 97% 99% 94% 100%  Weight:  78.1 kg    Height:        Intake/Output Summary (Last 24 hours) at 12/19/2017 1712 Last data filed at 12/19/2017  1540 Gross per 24 hour  Intake 680 ml  Output 2400 ml  Net -1720 ml   Filed Weights   12/18/17 0508 12/19/17 0417  Weight: 77.4 kg 78.1 kg    Exam:  . General: 84 y.o. year-old male well-developed well-nourished appears uncomfortable and his right lower extremity stage. . Cardiovascular: Regular rate and rhythm with no rubs or gallops.  No thyromegaly or JVD noted. Marland Kitchen Respiratory: Clear to auscultation with no wheezes or rales.  Good respiratory effort.  Abdomen: Soft nontender nondistended with normal bowel sounds x4 quadrants. . Musculoskeletal: No lower extremity edema. 2/4 pulses in all 4 extremities. . Skin: Demarcated erythematous lesion with warmth and severe tenderness on palpation noted in the right lower extremity. Marland Kitchen Psychiatry: Mood is appropriate for condition and setting. .  Data Reviewed: CBC: Recent Labs  Lab 12/17/17 2044 12/18/17 0533 12/19/17 0518  WBC 18.5* 16.9* 12.0*  NEUTROABS 16.7*  --   --   HGB 13.1 11.3* 12.1*  HCT 39.7 34.0* 36.5*  MCV 94.7 93.9 95.3  PLT 232 197 364   Basic Metabolic Panel: Recent Labs  Lab 12/17/17 2044 12/18/17 0533 12/19/17 0518  NA 141 138 137  K 3.7 3.3* 3.4*  CL 103 105 100  CO2 24 21* 22  GLUCOSE 136* 113* 116*  BUN 23 15 18   CREATININE 1.18 0.97 1.03  CALCIUM 9.1 8.0* 8.3*  MG  --   --  1.1*   GFR: Estimated Creatinine Clearance: 51.5 mL/min (by C-G formula based on SCr of 1.03 mg/dL). Liver Function Tests: Recent Labs  Lab 12/17/17 2044 12/18/17 0533  AST 28 21  ALT 7 13  ALKPHOS 83 65  BILITOT 2.3* 1.8*  PROT 8.6* 6.7  ALBUMIN 4.3 3.3*   No results for input(s): LIPASE, AMYLASE in the last 168 hours. No results for input(s): AMMONIA in the last 168 hours. Coagulation Profile: Recent Labs  Lab 12/17/17 2044  INR 1.03   Cardiac Enzymes: No results for input(s): CKTOTAL, CKMB, CKMBINDEX, TROPONINI in the last 168 hours. BNP (last 3 results) No results for input(s): PROBNP in the last 8760  hours. HbA1C: No results for input(s): HGBA1C in the last 72 hours. CBG: No results for input(s): GLUCAP in the last 168 hours. Lipid Profile: No results for input(s): CHOL, HDL, LDLCALC, TRIG, CHOLHDL, LDLDIRECT in the last 72 hours. Thyroid Function Tests: No results for input(s): TSH, T4TOTAL, FREET4, T3FREE, THYROIDAB in the last 72 hours. Anemia Panel: No results for input(s): VITAMINB12, FOLATE, FERRITIN, TIBC, IRON, RETICCTPCT in the last 72 hours. Urine analysis:    Component Value Date/Time   COLORURINE YELLOW 12/17/2017 [place] 12/17/2017 2224   LABSPEC 1.018 12/17/2017 2224   PHURINE 5.0 12/17/2017 2224   GLUCOSEU NEGATIVE 12/17/2017 2224   HGBUR NEGATIVE 12/17/2017 2224   BILIRUBINUR NEGATIVE 12/17/2017 2224   KETONESUR 20 (A) 12/17/2017 2224   PROTEINUR NEGATIVE 12/17/2017 2224   UROBILINOGEN 0.2 12/12/2015 2016   NITRITE NEGATIVE 12/17/2017 2224   LEUKOCYTESUR NEGATIVE 12/17/2017 2224   Sepsis Labs: @LABRCNTIP (procalcitonin:4,lacticidven:4)  ) Recent Results (from the past 240 hour(s))  Culture, blood (Routine x 2)     Status: None (Preliminary result)   Collection Time: 12/17/17  8:44 PM  Result Value Ref Range Status   Specimen Description   Final    BLOOD RIGHT ANTECUBITAL Performed at [facility] [address], [place], [place] [address]    Special Requests   Final    BOTTLES DRAWN AEROBIC AND ANAEROBIC Blood Culture adequate volume Performed at [facility] [address], [place], [place] [address]    Culture   Final    NO GROWTH 2 DAYS Performed at [facility] [address] [place], [place] [address]    Report Status PENDING  Incomplete  Culture, blood (Routine x 2)     Status: None (Preliminary result)   Collection Time: 12/17/17  8:44 PM  Result Value Ref Range Status   Specimen Description   Final    BLOOD LEFT ANTECUBITAL Performed at [facility] [address] [place], [place] [address]    Special Requests   Final    BOTTLES DRAWN AEROBIC AND ANAEROBIC Blood Culture adequate volume Performed at [facility] [address] [place], [place] [address]    Culture   Final    NO GROWTH 2 DAYS Performed at [facility] [address], [place], [place] [address]    Report Status PENDING  Incomplete  Urine culture     Status: None   Collection Time: 12/17/17 10:25 PM  Result Value Ref Range Status   Specimen Description   Final    URINE, CLEAN CATCH Performed at [facility], [place] [address] [place], [place] [address]    Special Requests   Final    NONE Performed at [facility], [place] [address], [place], [place] [address]    Culture   Final    NO GROWTH Performed at [facility], [place] [address], [place], [place] [address]    Report Status 12/19/2017 FINAL  Final  MRSA PCR Screening     Status: None   Collection Time: 12/18/17  6:29 AM  Result Value Ref Range Status   MRSA by PCR NEGATIVE NEGATIVE Final    Comment:        The GeneXpert MRSA Assay (FDA approved for NASAL specimens only), is one component of a comprehensive MRSA colonization surveillance program. It is not intended to diagnose MRSA infection nor to guide or monitor treatment for MRSA infections. Performed at [facility], [place] [address], [place], [place] [address]       Studies: No results found.  Scheduled Meds: . aspirin EC  81 mg Oral Daily  . atorvastatin  40 mg Oral Daily  . calcium carbonate  625 mg Oral Q breakfast  . carbidopa-levodopa  1 tablet Oral QID  . carvedilol  3.125 mg Oral Daily  . cholestyramine light  4 g Oral BID  . donepezil  10 mg Oral QHS  . enoxaparin (LOVENOX) injection  40 mg Subcutaneous Q24H  . fluticasone furoate-vilanterol  1 puff Inhalation Daily  . ipratropium  2 spray Each Nare TID  . midodrine  5 mg Oral BID WC  .  pantoprazole  40 mg Oral Daily  . potassium chloride SA  40 mEq Oral Daily  . sodium chloride flush  3 mL Intravenous Q12H  . torsemide  20 mg Oral Daily    Continuous Infusions: . cefTRIAXone (ROCEPHIN)  IV Stopped (12/18/17 2157)     LOS: 1 day     Kayleen Memos, MD [facility] Pager [phone]  If 7PM-7AM, please contact night-coverage [email or web address] Password [facility] 12/19/2017, 5:12 PM

## 2017-12-20 LAB — BASIC METABOLIC PANEL
Anion gap: 12 (ref 5–15)
BUN: 21 mg/dL (ref 8–23)
CO2: 28 mmol/L (ref 22–32)
Calcium: 8.7 mg/dL — ABNORMAL LOW (ref 8.9–10.3)
Chloride: 99 mmol/L (ref 98–111)
Creatinine, Ser: 1.06 mg/dL (ref 0.61–1.24)
GFR calc Af Amer: >60 mL/min (ref >60)
GFR calc non Af Amer: >60 mL/min (ref >60)
Glucose, Bld: 117 mg/dL — ABNORMAL HIGH (ref 70–99)
Potassium: 3.5 mmol/L (ref 3.5–5.1)
Sodium: 139 mmol/L (ref 135–145)

## 2017-12-20 LAB — CBC
HCT: 37.6 % — ABNORMAL LOW (ref 39.0–52.0)
Hemoglobin: 12.3 g/dL — ABNORMAL LOW (ref 13.0–17.0)
MCH: 31 pg (ref 26.0–34.0)
MCHC: 32.7 g/dL (ref 30.0–36.0)
MCV: 94.7 fL (ref 78.0–100.0)
Platelets: 226 10*3/uL (ref 150–400)
RBC: 3.97 MIL/uL — ABNORMAL LOW (ref 4.22–5.81)
RDW: 14 % (ref 11.5–15.5)
WBC: 9.9 10*3/uL (ref 4.0–10.5)

## 2017-12-20 LAB — MAGNESIUM: Magnesium: 2 mg/dL (ref 1.7–2.4)

## 2017-12-20 LAB — PHOSPHORUS: Phosphorus: 3.2 mg/dL (ref 2.5–4.6)

## 2017-12-20 MED ORDER — IPRATROPIUM-ALBUTEROL 0.5-2.5 (3) MG/3ML IN SOLN: 3.0000 mL | Freq: Four times a day (QID) | RESPIRATORY_TRACT | 0 refills | Status: DC | PRN

## 2017-12-20 NOTE — Discharge Summary (Signed)
Discharge Summary  [name] [name] GDJ:[id number] DOB: 24-Dec-1928  PCP: Shon Baton, MD  Admit date: 12/17/2017 Discharge date: 12/20/2017  Time spent: 30 minutes  Recommendations for Outpatient Follow-up:  1. Physical therapy recommended inpatient rehab patient is [facility]  Discharge Diagnoses:  Active Hospital Problems   Diagnosis Date Noted  . Sepsis due to cellulitis ([place]) 12/18/2017  . Parkinson disease ([place]) 12/30/2014  . Coronary artery disease   . Chronic diastolic CHF (congestive heart failure) ([place])   . Essential hypertension 04/29/2007  . Crohn's disease ([place]) 04/29/2007    Resolved Hospital Problems  No resolved problems to display.    Discharge Condition: Improved  Diet recommendation: Healthy heart  Vitals:   12/20/17 0950 12/20/17 1410  BP: 128/62 116/69  Pulse: 82 75  Resp: 18 16  Temp: 97.9 F (36.6 C) (!) 97.4 F (36.3 C)  SpO2: 99% 98%    History of present illness:  Corrin Hingle Beardis a 84 y.o.malewith medical history significant ofHTN, HLD, diastolic CHF, CAD s/p stent, Parkinson's disease,and Crohn's disease; who presents after having a fall. At baseline ambulates with a walker due to worsening gait issues related to Parkinson's, but he was not reportedly using a walker at this time. Have been seen by his primary care provider and started on doxycycline today, but had developed fever and came into the emergency department for further evaluation.  Due to redness of the right leg. He had previously completed a 10-day course of antibiotics thought to be Augmentin 1-2weeks earlier with resolution of symptoms.  Admitted for sepsis secondary to cellulitis   Hospital Course:  Principal Problem:   Sepsis due to cellulitis [facility]) Active Problems:   Essential hypertension   Crohn's disease ([place])   Chronic diastolic CHF (congestive heart failure) (HCC)   Coronary artery disease   Parkinson disease  ([place])  Patient was admitted to inpatient MedSurg for having failed antibiotics outpatient for his right leg cellulitis.  He was started on Rocephin.  With improvement of his cellulitis of the right leg.  He will be completing a course of  doxycycline that was started on the day of admission as  outpatient.  Physical therapy evaluated patient and found to be very unsteady which is what was happening prior to them going to admission and he recommended inpatient rehab.  So [facility] to [facility].  So he is going to be admitted to the skilled nursing facility for inpatient rehab. During his hospital stay he continued to have diarrhea from his Crohn's disease which has been maintained on Imodium at the assisted living facility prior to admission and he was continued on Imodium Procedures:  Physical therapy evaluation  Consultations:  Physical therapy  Social worker  Discharge Exam: BP 116/69 (BP Location: Left Arm)   Pulse 75   Temp (!) 97.4 F (36.3 C) (Oral)   Resp 16   Ht 5' 9"  (1.753 m)   Wt 72.3 kg   SpO2 98%   BMI 23.54 kg/m   General: Well-developed well-nourished making good conversation Cardiovascular: Regular rate rhythm no murmur Respiratory: Equal bilaterally unlabored Skin:  The cellulitis on the right lower extremity is resolved still a bit of redness compared to the left side but there is no warmth no tenderness  Discharge Instructions You were cared for by a hospitalist during your hospital stay. If you have any questions  about your discharge medications or the care you received while you were in the hospital after you are discharged, you can call the unit and asked to speak with the hospitalist on call if the hospitalist that took care of you is not available. Once you are discharged, your primary care physician will handle any  further medical issues. Please note that NO REFILLS for any discharge medications will be authorized once you are discharged, as it is imperative that you return to your primary care physician (or establish a relationship with a primary care physician if you do not have one) for your aftercare needs so that they can reassess your need for medications and monitor your lab values.  Discharge Instructions    Call MD for:  redness, tenderness, or signs of infection (pain, swelling, redness, odor or green/yellow discharge around incision site)   Complete by:  As directed    Call MD for:  temperature >100.4   Complete by:  As directed    Diet - low sodium heart healthy   Complete by:  As directed    Discharge instructions   Complete by:  As directed    Completes doxycycline from previous 10-day supply he should have a total of 9 days left   Increase activity slowly   Complete by:  As directed    Per rehab/ inpatient rehab     Allergies as of 12/20/2017   No Known Allergies     Medication List    TAKE these medications   acetaminophen 325 MG tablet Commonly known as:  TYLENOL Take 650 mg by mouth every 6 (six) hours as needed for pain.   ADVAIR DISKUS 250-50 MCG/DOSE Aepb Generic drug:  Fluticasone-Salmeterol Inhale 1 puff into the lungs daily. For shortness of breath   aspirin EC 81 MG tablet Take 81 mg by mouth daily.   atorvastatin 40 MG tablet Commonly known as:  LIPITOR Take 40 mg by mouth daily.   CALCIUM 600 600 MG Tabs tablet Generic drug:  calcium carbonate Take 600 mg by mouth daily with breakfast.   carbidopa-levodopa 50-200 MG tablet Commonly known as:  SINEMET CR Take 1 tablet by mouth 4 (four) times daily.   carvedilol 3.125 MG tablet Commonly known as:  COREG Take 3.125 mg by mouth daily.   CENTRUM SILVER PO Take 1 tablet by mouth daily.   cholestyramine 4 g packet Commonly known as:  QUESTRAN TAKE 1 PACKET BY MOUTH TWICE DAILY WITH A MEAL   donepezil  10 MG tablet Commonly known as:  ARICEPT TAKE ONE TABLET AT BEDTIME   doxycycline 100 MG tablet Commonly known as:  VIBRA-TABS Take 100 mg by mouth 2 (two) times daily.   fluorouracil 5 % cream Commonly known as:  EFUDEX Apply 1 application topically as directed. By dermatologist   ipratropium 0.03 % nasal spray Commonly known as:  ATROVENT Place 2 sprays into both nostrils 3 (three) times daily.   ipratropium-albuterol 0.5-2.5 (3) MG/3ML Soln Commonly known as:  DUONEB Take 3 mLs by nebulization every 6 (six) hours as needed.   loperamide 2 MG tablet Commonly known as:  IMODIUM A-D Take 2 mg by mouth 2 (two) times daily as needed for diarrhea or loose stools.   midodrine 5 MG tablet Commonly known as:  PROAMATINE Take 1 tablet (5 mg total) by mouth 2 (two) times daily with a meal.   mupirocin cream 2 % Commonly known as:  BACTROBAN Apply 1 application topically daily as needed (skin  irritation).   nitroGLYCERIN 0.4 MG SL tablet Commonly known as:  NITROSTAT Place 1 tablet (0.4 mg total) under the tongue every 5 (five) minutes as needed for chest pain. PT OVERDUE FOR OV PLEASE CALL FOR APPT   pantoprazole 40 MG tablet Commonly known as:  PROTONIX Take 40 mg by mouth daily.   potassium chloride SA 20 MEQ tablet Commonly known as:  K-DUR,KLOR-CON Take 40 mEq by mouth daily.   torsemide 20 MG tablet Commonly known as:  DEMADEX Take 1 tablet (20 mg total) by mouth daily.   vitamin B-12 1000 MCG tablet Commonly known as:  CYANOCOBALAMIN Take 1,000 mcg by mouth daily.   Vitamin D-3 1000 units Caps Take 1,000 Units by mouth daily.      No Known Allergies Contact information for after-discharge care    Destination    [facility] .   Service:  Skilled Nursing Contact information: [place] [phone]               The results of significant diagnostics from this  hospitalization (including imaging, microbiology, ancillary and laboratory) are listed below for reference.    Significant Diagnostic Studies: Ct Abdomen Pelvis Wo Contrast  Result Date: 11/24/2017 CLINICAL DATA:  Right lower abdominal pain, hematuria, history of kidney stones. Crohn disease with resection. EXAM: CT ABDOMEN AND PELVIS WITHOUT CONTRAST TECHNIQUE: Multidetector CT imaging of the abdomen and pelvis was performed following the standard protocol without IV contrast. COMPARISON:  09/28/2012. FINDINGS: Lower chest: Lung bases are clear. Heart size normal. Coronary artery calcification. No pericardial or pleural effusion. Distal esophagus is grossly unremarkable. Hepatobiliary: Liver is unremarkable. Cholecystectomy. No biliary ductal dilatation. Pancreas: Negative. Spleen: Negative. Adrenals/Urinary Tract: Adrenal glands and right kidney are unremarkable. Probable punctate stone in the lower pole left kidney. Ureters are decompressed. Mild bladder trabeculation. Stomach/Bowel: Stomach and small bowel are unremarkable. Right hemicolectomy. Colon is otherwise unremarkable. Vascular/Lymphatic: Atherosclerotic calcification of the arterial vasculature without abdominal aortic aneurysm. No pathologically enlarged lymph nodes. Reproductive: Prostate is visualized. Other: Bilateral inguinal hernias contain fat. No free fluid. Mesenteries and peritoneum are unremarkable. Musculoskeletal: Postoperative changes in the spine. Sacroiliac joints are patent. No worrisome lytic or sclerotic lesions. Advanced degenerative changes at L1-2. IMPRESSION: 1. No findings to explain the patient's symptoms. 2. Probable punctate left renal stone. 3. Aortic atherosclerosis (ICD10-170.0). Coronary artery calcification. Electronically Signed   By: Lorin Picket M.D.   On: 11/24/2017 08:27   Dg Chest 2 View  Result Date: 12/17/2017 CLINICAL DATA:  Fever EXAM: CHEST - 2 VIEW COMPARISON:  08/14/2017 FINDINGS: The heart size  and mediastinal contours are within normal limits. Both lungs are clear. Degenerative spurring of the spine. IMPRESSION: No active cardiopulmonary disease. Electronically Signed   By: Donavan Foil M.D.   On: 12/17/2017 21:59   Dg Hip Unilat W Or Wo Pelvis 2-3 Views Right  Result Date: 12/17/2017 CLINICAL DATA:  Fall with hip pain EXAM: DG HIP (WITH OR WITHOUT PELVIS) 2-3V RIGHT COMPARISON:  CT 11/21/2017 FINDINGS: Postsurgical changes in the lumbosacral spine. Mild SI joint degenerative changes. Pubic symphysis and rami are intact. No fracture or malalignment. IMPRESSION: No acute osseous abnormality. Electronically Signed   By: Donavan Foil M.D.   On: 12/17/2017 22:00    Microbiology: Recent Results (from the past 240 hour(s))  Culture, blood (Routine x 2)     Status: None (Preliminary result)   Collection Time: 12/17/17  8:44 PM  Result Value  Ref Range Status   Specimen Description   Final    BLOOD RIGHT ANTECUBITAL Performed at [facility] [address], [place], [place] [address]    Special Requests   Final    BOTTLES DRAWN AEROBIC AND ANAEROBIC Blood Culture adequate volume Performed at [facility] [address], [place], [place] [address]    Culture   Final    NO GROWTH 3 DAYS Performed at [facility], [place] [address], [place], [place] [address]    Report Status PENDING  Incomplete  Culture, blood (Routine x 2)     Status: None (Preliminary result)   Collection Time: 12/17/17  8:44 PM  Result Value Ref Range Status   Specimen Description   Final    BLOOD LEFT ANTECUBITAL Performed at [facility] [address] [place], [place] [address]    Special Requests   Final    BOTTLES DRAWN AEROBIC AND ANAEROBIC Blood Culture adequate volume Performed at [facility] [address] [place], [place] [address]    Culture   Final    NO GROWTH 3 DAYS Performed at [facility], [place] [address] [place], [place] [address]    Report Status PENDING  Incomplete  Urine culture     Status: None   Collection Time: 12/17/17 10:25 PM  Result Value Ref Range Status   Specimen Description   Final    URINE, CLEAN CATCH Performed at [facility], [place] [address], [place], [place] [address]    Special Requests   Final    NONE Performed at [facility], [place] [address] [place], [place] [address]    Culture   Final    NO GROWTH Performed at [facility], [place] [address] [place], [place] [address]    Report Status 12/19/2017 FINAL  Final  MRSA PCR Screening     Status: None   Collection Time: 12/18/17  6:29 AM  Result Value Ref Range Status   MRSA by PCR NEGATIVE NEGATIVE Final    Comment:        The GeneXpert MRSA Assay (FDA approved for NASAL specimens only), is one component of a comprehensive MRSA colonization surveillance program. It is not intended to diagnose MRSA infection nor to guide or monitor treatment for MRSA infections. Performed at [facility], [place] [address], [place], [place] [address]      Labs: Basic Metabolic Panel: Recent Labs  Lab 12/17/17 2044 12/18/17 0533 12/19/17 0518 12/20/17 0522  NA 141 138 137 139  K 3.7 3.3* 3.4* 3.5  CL 103 105 100 99  CO2 24 21* 22 28  GLUCOSE 136* 113* 116* 117*  BUN 23 15 18 21   CREATININE 1.18 0.97 1.03 1.06  CALCIUM 9.1 8.0* 8.3* 8.7*  MG  --   --  1.1* 2.0  PHOS  --   --   --  3.2   Liver Function Tests: Recent Labs  Lab 12/17/17 2044 12/18/17 0533  AST 28 21  ALT 7 13  ALKPHOS 83 65  BILITOT 2.3* 1.8*  PROT 8.6* 6.7  ALBUMIN 4.3 3.3*   No results for input(s): LIPASE, AMYLASE in the last 168 hours. No results for input(s): AMMONIA in the last 168 hours. CBC: Recent Labs  Lab 12/17/17 2044 12/18/17 0533 12/19/17 0518 12/20/17 0522  WBC 18.5* 16.9* 12.0* 9.9  NEUTROABS 16.7*  --   --   --  HGB 13.1 11.3* 12.1* 12.3*  HCT  39.7 34.0* 36.5* 37.6*  MCV 94.7 93.9 95.3 94.7  PLT 232 197 201 226   Cardiac Enzymes: No results for input(s): CKTOTAL, CKMB, CKMBINDEX, TROPONINI in the last 168 hours. BNP: BNP (last 3 results) No results for input(s): BNP in the last 8760 hours.  ProBNP (last 3 results) No results for input(s): PROBNP in the last 8760 hours.  CBG: No results for input(s): GLUCAP in the last 168 hours.     Signed:  Cristal Deer, MD [facility] 12/20/2017, 4:35 PM

## 2017-12-20 NOTE — Progress Notes (Signed)
Patient discharging to [facility].  CSW confirmed bed with facility and faxed appropriate documents. PTAR has been called for transport.  RN call report to: [phone]  No more CSW needs. Signing off.    Pricilla Holm, MSW, [place] Social Work [phone]

## 2017-12-20 NOTE — Progress Notes (Signed)
PTAR is here mto transport pt to [facility] re[facility], Aricept has been given. Will need Sinemet

## 2017-12-20 NOTE — Clinical Social Work Placement (Signed)
Patient will discharge to [facility].  CSW confirmed bed at facility.  Patient will transport via [place]. Patient and wife aware.  RN call report to: [phone]  CLINICAL SOCIAL WORK PLACEMENT  NOTE  Date:  12/20/2017  Patient Details  Name: [name] [name] MRN: [id number] Date of Birth: 1928/12/20  Clinical Social Work is seeking post-discharge placement for this patient at the [place] level of care (*CSW will initial, date and re-position this form in  chart as items are completed):  Yes   Patient/family provided with [place] [address] Work Department's list of facilities offering this level of care within the geographic area requested by the patient (or if unable, by the patient's family).  Yes   Patient/family informed of their freedom to choose among providers that offer the needed level of care, that participate in Medicare, Medicaid or managed care program needed by the patient, have an available bed and are willing to accept the patient.  Yes   Patient/family informed of [facility]'s ownership interest in [facility] and [facility], as well as of the fact that they are under no obligation to receive care at these facilities.  PASRR submitted to EDS on       PASRR number received on       Existing PASRR number confirmed on       FL2 transmitted to all facilities in geographic area requested by pt/family on       FL2 transmitted to all facilities within larger geographic area on       Patient informed that his/her managed care company has contracts with or will negotiate with certain facilities, including the following:        Yes   Patient/family informed of bed offers received.  Patient chooses bed at [facility]     Physician recommends and patient chooses bed at [facility]    Patient to be transferred to [facility] on 12/20/17.  Patient to be transferred to facility by PTAR     Patient family notified on 12/20/17 of  transfer.  Name of family member notified:  Emilee Hero     PHYSICIAN Please prepare priority discharge summary, including medications     Additional Comment:    _______________________________________________ Pricilla Holm, [place] 12/20/2017, 4:58 PM

## 2017-12-20 NOTE — Clinical Social Work Note (Signed)
Clinical Social Work Assessment  Patient Details  Name: [name] [name] MRN: [id number] Date of Birth: 1928/04/10  Date of referral:  12/19/17               Reason for consult:  Facility Placement                Permission sought to share information with:  Facility Art therapist granted to share information::  Yes, Verbal Permission Granted  Name::     [name] [name]  Agency::  [facility]  Relationship::  Spouse  Contact Information:  [phone]  Housing/Transportation Living arrangements for the past 2 months:  [place] of Information:  Patient, Spouse Patient Interpreter Needed:  None Criminal Activity/Legal Involvement Pertinent to Current Situation/Hospitalization:  No - Comment as needed Significant Relationships:  Adult Children, Spouse Lives with:  Spouse Do you feel safe going back to the place where you live?  Yes Need for family participation in patient care:  Yes (Comment)  Care giving concerns:  Patient has h/o Crohn's, CHF, and Parkinson's.  Per patient's wife, patient is weak and unable to do much physically. Patient lives at [place] and will have access to their rehab.   Social Worker assessment / plan:  CSW met with patient and wife, [name] [name], at bedside to discuss role and discharge plan. Patient and wife live at [place] and plan for patient to go to their rehab at d/c. Patient has supportive children (two sons, one daughter) who live nearby but are currently out of town.   Patient's wife said patient has been managing well at home but became too weak to continue with the therapy he had been receiving for his Parkinson's. Patient's wife became teary eyed when discussing patient's gradual decline but remains hopeful and knows rehab will help him regain strength.  FL2 is complete. CSW will coordinate discharge to [place] when patient is medically able.  Employment status:  Retired Forensic scientist:  Other  (Comment Required)([employer]) PT Recommendations:  [employer] / Referral to community resources:  [place]  Patient/Family's Response to care:  Patient and his wife were appreciative of CSW's visit and discussion about discharge process. Patient's wife says patient is agitated and anxious to leave. Patient's wife discussed their great experience in the hospital and appreciation of the staff.  Patient/Family's Understanding of and Emotional Response to Diagnosis, Current Treatment, and Prognosis:  Patient and wife understand discharge process and plan. They are aware CSW will coordination transition back to [place].  Emotional Assessment Appearance:  Appears stated age Attitude/Demeanor/Rapport:    Affect (typically observed):  Accepting, Pleasant, Anxious Orientation:  Oriented to Self, Oriented to Place, Oriented to Situation, Oriented to  Time Alcohol / Substance use:  Not Applicable Psych involvement (Current and /or in the community):  No (Comment)  Discharge Needs  Concerns to be addressed:  Care Coordination Readmission within the last 30 days:  No Current discharge risk:  Physical Impairment Barriers to Discharge:  Continued Medical Work up   [employer], [place] 12/20/2017, 1:59 PM

## 2017-12-20 NOTE — Progress Notes (Signed)
Assessment unchanged. Pt and wife verbalized understanding of dc instructions. Packet prepared by SW for PTAR to present to nurse at [facility]. Awaiting PTAR to arrived for tranport to facility this evening. Dinner ordered for pt while waiting. No complaints voiced.

## 2017-12-20 NOTE — Progress Notes (Signed)
Report given to Carmie End, RN @ [facility] regarding pt status and plans to transfer to facility today.

## 2017-12-22 LAB — CULTURE, BLOOD (ROUTINE X 2)
Culture: NO GROWTH
Culture: NO GROWTH
Special Requests: ADEQUATE
Special Requests: ADEQUATE

## 2017-12-23 ENCOUNTER — Encounter: Payer: Self-pay | Admitting: Internal Medicine

## 2017-12-23 ENCOUNTER — Non-Acute Institutional Stay (SKILLED_NURSING_FACILITY): Payer: PPO | Admitting: Internal Medicine

## 2017-12-23 DIAGNOSIS — Z9181 History of falling: Secondary | ICD-10-CM | POA: Diagnosis not present

## 2017-12-23 DIAGNOSIS — I2510 Atherosclerotic heart disease of native coronary artery without angina pectoris: Secondary | ICD-10-CM | POA: Diagnosis not present

## 2017-12-23 DIAGNOSIS — G20 Parkinson's disease: Secondary | ICD-10-CM | POA: Diagnosis not present

## 2017-12-23 DIAGNOSIS — A419 Sepsis, unspecified organism: Secondary | ICD-10-CM | POA: Diagnosis not present

## 2017-12-23 DIAGNOSIS — K50918 Crohn's disease, unspecified, with other complication: Secondary | ICD-10-CM | POA: Diagnosis not present

## 2017-12-23 DIAGNOSIS — R278 Other lack of coordination: Secondary | ICD-10-CM | POA: Diagnosis not present

## 2017-12-23 DIAGNOSIS — I951 Orthostatic hypotension: Secondary | ICD-10-CM | POA: Diagnosis not present

## 2017-12-23 DIAGNOSIS — R413 Other amnesia: Secondary | ICD-10-CM | POA: Diagnosis not present

## 2017-12-23 DIAGNOSIS — K2210 Ulcer of esophagus without bleeding: Secondary | ICD-10-CM | POA: Diagnosis not present

## 2017-12-23 DIAGNOSIS — L03115 Cellulitis of right lower limb: Secondary | ICD-10-CM | POA: Diagnosis not present

## 2017-12-23 DIAGNOSIS — A4189 Other specified sepsis: Secondary | ICD-10-CM | POA: Diagnosis not present

## 2017-12-23 DIAGNOSIS — I5032 Chronic diastolic (congestive) heart failure: Secondary | ICD-10-CM

## 2017-12-23 DIAGNOSIS — N3946 Mixed incontinence: Secondary | ICD-10-CM | POA: Diagnosis not present

## 2017-12-23 DIAGNOSIS — R269 Unspecified abnormalities of gait and mobility: Secondary | ICD-10-CM | POA: Diagnosis not present

## 2017-12-23 DIAGNOSIS — R2689 Other abnormalities of gait and mobility: Secondary | ICD-10-CM | POA: Diagnosis not present

## 2017-12-23 DIAGNOSIS — G20A1 Parkinson's disease without dyskinesia, without mention of fluctuations: Secondary | ICD-10-CM

## 2017-12-23 DIAGNOSIS — R4181 Age-related cognitive decline: Secondary | ICD-10-CM | POA: Diagnosis not present

## 2017-12-23 DIAGNOSIS — L0390 Cellulitis, unspecified: Secondary | ICD-10-CM

## 2017-12-23 NOTE — Progress Notes (Signed)
Provider:  Rexene Edison. Mariea Clonts, D.O., C.M.D. Location:    Nursing Home Room Number: [id number] rehab Place of Service:  SNF (31)  PCP: Shon Baton, MD Patient Care Team: Shon Baton, MD as PCP - General (Internal Medicine) Bensimhon, Shaune Pascal, MD (Cardiology) [name], [name], MD as Consulting Physician (Cardiology) [name], MD as Attending Physician (Gastroenterology) [name], MD as Consulting Physician (Neurology) Community, [facility] ([place])  Extended Emergency Contact Information Primary Emergency Contact: [name] Address: [address]          [name] Phone: [phone] Mobile Phone: [phone] Relation: Spouse  Code Status: DNR Goals of Care: Advanced Directive information Advanced Directives 12/23/2017  Does Patient Have a Medical Advance Directive? Yes  Type of Paramedic of [place];Living will;Out of facility DNR (pink MOST or yellow form)  Does patient want to make changes to medical advance directive? No - Patient declined  Copy of [place] in Chart? Yes  Pre-existing out of facility DNR order (yellow form or pink MOST form) Yellow form placed in chart (order not valid for inpatient use)    Chief Complaint  Patient presents with  . New Admit To SNF    Rehab admission    HPI: Patient is a 84 y.o. male with h/o Parkinson's disease, orthostatic hypotension, HTN, hyperlipidemia, chronic diastolic chf, CAD s/p stent, Crohn's, COPD and GERD seen today for admission to [facility] rehab s/p hospitalization from 9/11-9/14/19.  He had visited his PCP, Dr. Virgina Jock, due to recurrence of erythema, warmth, swelling and pain of his RLE on 9/11.  He'd already been treated 1-2 wks before with a 10 day course of augmentin with resolution of the cellulitis.  It recurred.  He was started on doxycycline for this.  Unfortunately, later in the day, he sustained  a fall in his IL apt striking a soft stool and his head on the mattress of his bed.  Due to this and a fever of 101F, he was brought to the ED for evaluation.  There, he was found to have the same erythema, warmth, swelling of his RLE, fever, wbc of 18.5, lactic acid of 1.3, tachypnea, tachycardia.  He'd had a negative venous doppler 8/20.  Blood cultures x 2 were obtained and fluids were started.  He was given empiric Rocephin in the ED.  He was diagnosed with sepsis secondary to RLE cellulitis.  He was changed back to the doxycycline and will complete a full 10 day course (first dose was 9/11).  WBC had improved to normal range as of 9/14 when he was discharged here for PT.  His mag and potassium were low and repleted by discharge.  While here, staff have noted that his wife is very capable of helping him, but may be getting a bit overwhelmed with his demands and needs.  She reports she helps only with small things with his ADL routine.  He's been working with PT so far and the team in rehab is suggesting an OT consult which seems appropriate.    Review of neurologist, Dr. Jannifer Franklin', last note 6/28 indicates Mr Jay has been having more difficulty with freezing, getting up out of a chair, shuffling gait and slowness of movement.  He's also become drowsy in the day and sleeps through much of it.  He's been attending rock steady boxing some days, but the rest of the days are inactive.  He also has difficulty  with memory loss--on aricept. Dr. Jannifer Franklin recommended a sleep study for him.  He also gave him a script for a rolling walker with seat and hand brakes on 6/28.  MMSE - Mini Mental State Exam 10/03/2017 05/02/2017 12/18/2016  Orientation to time 4 4 2   Orientation to Place 5 5 5   Registration 3 3 3   Attention/ Calculation 1 5 2   Recall 3 3 2   Language- name 2 objects 2 2 2   Language- repeat 1 1 1   Language- follow 3 step command 3 3 3   Language- read & follow direction 1 1 1   Write a sentence 1 1 0    Copy design 1 1 1   Total score 25 29 22    When seen, he had no complaints.  He is eager to discharge home, but understands why he needs to stay for some more PT and OT.  While I was preparing to see him today, he was working with PT and was asked to turn around and went around in 2-3 circles instead of just turning around once and did it at very high speed like he had a festinating turn.    He has lost 10-11 lbs over the past 9 mos.  He admits he does not eat as well as he used to. His bowels are moving ok--did not c/o diarrhea at this point. Past Medical History:  Diagnosis Date  . Cellulitis   . Chronic diastolic CHF (congestive heart failure) (HCC)    a. EF initially 35-40% after MI 1/09; b. echo 7/08: EF 60%;  c. 11/2014 Echo: EF 65-70%, Gr 1 DD, mild MR.  . Coronary artery disease    a. s/p anterior STEMI 04/2007 with BMS-> LAD;  b. Cath 12/2011 patent stent;  c. low risk nuc 04/2014.  . Crohn's disease ([place])   . Diaphragmatic hernia without mention of obstruction or gangrene   . Diverticulosis of colon (without mention of hemorrhage)   . Fatty liver    a. on ultrasound of 10/2009  . Flatulence, eructation, and gas pain   . Fungal infection   . Gait disorder   . GERD (gastroesophageal reflux disease)   . HOH (hard of hearing)    Hearing aids  . Hyperlipidemia   . Hypertension   . Ischemic cardiomyopathy    a. EF initially 35-40% after MI 1/09; b. echo 7/08: EF 60%;  c. 11/2014 Echo: EF 65-70%, Gr 1 DD, mild MR.  . Melanoma ([place])   . Memory disorder   . Orthostatic hypotension   . Osteoporosis   . Other chronic nonalcoholic liver disease   . Other esophagitis   . Parkinson's disease ([place])   . RBBB 09/02/2013  . Regional enteritis of small intestine ([place])   . Renal calculi   . Restless leg syndrome 03/22/2015  . 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 Urinary incontinence    Past Surgical History:  Procedure Laterality Date  . APPENDECTOMY    . BACK SURGERY     L3, L4,  L5  . CARDIAC CATHETERIZATION  09/25/06   EF 35-40% but more recently 60%  . CATARACT EXTRACTION     Bilateral  . CHOLECYSTECTOMY    . Coronary artery stent placement    . Melanoma resection    . Partial bowel resection    . TONSILLECTOMY      Social History   Socioeconomic History  . Marital status: Married    Spouse name: [name]  . Number of children: 3  .  Years of education: 79  . Highest education level: Bachelor's degree (e.g., BA, AB, BS)  Occupational History  . Occupation: CEO-retired    Employer: [employer]  . Occupation: Licensed conveyancer: [employer]  . Financial resource strain: Not hard at all  . Food insecurity:    Worry: Never true    Inability: Never true  . Transportation needs:    Medical: No    Non-medical: No  Tobacco Use  . Smoking status: Never Smoker  . Smokeless tobacco: Never Used  Substance and Sexual Activity  . Alcohol use: Yes    Alcohol/week: 2.0 - 3.0 standard drinks    Types: 2 - 3 Standard drinks or equivalent per week    Comment: occ   . Drug use: No  . Sexual activity: Not Currently  Lifestyle  . Physical activity:    Days per week: 0 days    Minutes per session: 0 min  . Stress: Not at all  Relationships  . Social connections:    Talks on phone: More than three times a week    Gets together: More than three times a week    Attends religious service: Never    Active member of club or organization: No    Attends meetings of clubs or organizations: Never    Relationship status: Married  Other Topics Concern  . Not on file  Social History Narrative   Lives w/ his wife at [place]   Patient is right handed.   Patient drinks very little caffeine.    reports that he has never smoked. He has never used smokeless tobacco. He reports that he drinks about 2.0 - 3.0 standard drinks of alcohol per week. He reports that he does not use drugs.  Functional Status Survey:    Family  History  Problem Relation Age of Onset  . Heart failure Mother   . Heart attack Mother   . Hypertension Mother   . Emphysema Father   . Heart disease Brother   . Heart disease Brother   . Coronary artery disease Unknown   . Colon cancer Brother        older at dx  . Stomach cancer Neg Hx     Health Maintenance  Topic Date Due  . TETANUS/TDAP  01/07/1950  . PNA vac Low Risk Adult (1 of 2 - PCV13) 01/08/1996  . INFLUENZA VACCINE  11/06/2017    No Known Allergies  Outpatient Encounter Medications as of 12/23/2017  Medication Sig  . acetaminophen (TYLENOL) 325 MG tablet Take 650 mg by mouth every 6 (six) hours as needed for pain.  Marland Kitchen ADVAIR DISKUS 250-50 MCG/DOSE AEPB Inhale 1 puff into the lungs daily. For shortness of breath  . aspirin EC 81 MG tablet Take 81 mg by mouth daily.  Marland Kitchen atorvastatin (LIPITOR) 40 MG tablet Take 40 mg by mouth daily.  . calcium carbonate (CALCIUM 600) 600 MG TABS tablet Take 600 mg by mouth daily with breakfast.  . carbidopa-levodopa (SINEMET CR) 50-200 MG tablet Take 1 tablet by mouth 4 (four) times daily.  . carvedilol (COREG) 3.125 MG tablet Take 3.125 mg by mouth daily.   . Cholecalciferol (VITAMIN D-3) 1000 UNITS CAPS Take 1,000 Units by mouth daily.  . cholestyramine (QUESTRAN) 4 g packet TAKE 1 PACKET BY MOUTH TWICE DAILY WITH A MEAL  . donepezil (ARICEPT) 10 MG tablet TAKE ONE TABLET AT BEDTIME  . doxycycline (VIBRA-TABS) 100 MG tablet Take  100 mg by mouth 2 (two) times daily.  . fluorouracil (EFUDEX) 5 % cream Apply 1 application topically as directed. By dermatologist  . ipratropium (ATROVENT) 0.03 % nasal spray Place 2 sprays into both nostrils 3 (three) times daily.  Marland Kitchen ipratropium-albuterol (DUONEB) 0.5-2.5 (3) MG/3ML SOLN Take 3 mLs by nebulization every 6 (six) hours as needed.  . loperamide (IMODIUM A-D) 2 MG tablet Take 2 mg by mouth 2 (two) times daily as needed for diarrhea or loose stools.  . midodrine (PROAMATINE) 5 MG tablet Take 1  tablet (5 mg total) by mouth 2 (two) times daily with a meal.  . Multiple Vitamins-Minerals (CENTRUM SILVER PO) Take 1 tablet by mouth daily.  . mupirocin cream (BACTROBAN) 2 % Apply 1 application topically daily as needed (skin irritation).  . nitroGLYCERIN (NITROSTAT) 0.4 MG SL tablet Place 1 tablet (0.4 mg total) under the tongue every 5 (five) minutes as needed for chest pain. PT OVERDUE FOR OV PLEASE CALL FOR APPT  . pantoprazole (PROTONIX) 40 MG tablet Take 40 mg by mouth daily.   . potassium chloride SA (K-DUR,KLOR-CON) 20 MEQ tablet Take 40 mEq by mouth daily.  Marland Kitchen torsemide (DEMADEX) 20 MG tablet Take 1 tablet (20 mg total) by mouth daily.  . vitamin B-12 (CYANOCOBALAMIN) 1000 MCG tablet Take 1,000 mcg by mouth daily.   No facility-administered encounter medications on file as of 12/23/2017.     Review of Systems  Constitutional: Positive for weight loss. Negative for chills, fever and malaise/fatigue.  HENT: Negative for congestion.   Eyes: Negative for blurred vision.  Respiratory: Positive for cough and sputum production. Negative for shortness of breath.        Some mild white sputum production in past week  Cardiovascular: Positive for leg swelling. Negative for chest pain and palpitations.       Very slight residual swelling of right leg  Gastrointestinal: Negative for abdominal pain, blood in stool, constipation, diarrhea and melena.  Genitourinary: Negative for dysuria.  Musculoskeletal: Positive for falls.  Neurological: Negative for dizziness, tremors, loss of consciousness and weakness.       Very little tremor, primarily gait abnormality with his PD  Endo/Heme/Allergies: Does not bruise/bleed easily.  Psychiatric/Behavioral: Positive for memory loss. Negative for depression. The patient is not nervous/anxious and does not have insomnia.     Vitals:   12/23/17 0923  BP: 135/79  Pulse: 85  Resp: 18  Temp: 98.4 F (36.9 C)  TempSrc: Oral  SpO2: 97%  Weight: 159 lb  (72.1 kg)   Body mass index is 23.48 kg/m. Physical Exam  Constitutional: No distress.  HENT:  Head: Normocephalic and atraumatic.  Right Ear: External ear normal.  Left Ear: External ear normal.  Nose: Nose normal.  Mouth/Throat: Oropharynx is clear and moist. No oropharyngeal exudate.  Eyes: Pupils are equal, round, and reactive to light. Conjunctivae and EOM are normal.  Neck: Normal range of motion. Neck supple. No JVD present. No thyromegaly present.  Cardiovascular: Normal rate, regular rhythm, normal heart sounds and intact distal pulses.  Pulmonary/Chest: Effort normal and breath sounds normal. No respiratory distress.  Abdominal: Soft. Bowel sounds are normal. He exhibits no distension and no mass. There is no tenderness. There is no rebound and no guarding.  Musculoskeletal: Normal range of motion.  Shuffling, festinating gait, no freezing witnessed; uses rolling walker with skis  Lymphadenopathy:    He has no cervical adenopathy.  Neurological: He is alert. No cranial nerve deficit. He exhibits abnormal  muscle tone. Coordination abnormal.  Skin: Skin is warm and dry. Capillary refill takes less than 2 seconds.  Very small amount of residual swelling, erythema, tenderness of right leg near top of sock vs left  Psychiatric: He has a normal mood and affect.    Labs reviewed: Basic Metabolic Panel: Recent Labs    12/18/17 0533 12/19/17 0518 12/20/17 0522  NA 138 137 139  K 3.3* 3.4* 3.5  CL 105 100 99  CO2 21* 22 28  GLUCOSE 113* 116* 117*  BUN 15 18 21   CREATININE 0.97 1.03 1.06  CALCIUM 8.0* 8.3* 8.7*  MG  --  1.1* 2.0  PHOS  --   --  3.2   Liver Function Tests: Recent Labs    09/02/17 1705 12/17/17 2044 12/18/17 0533  AST 27 28 21   ALT 14* 7 13  ALKPHOS 70 83 65  BILITOT 1.8* 2.3* 1.8*  PROT 7.8 8.6* 6.7  ALBUMIN 4.1 4.3 3.3*   No results for input(s): LIPASE, AMYLASE in the last 8760 hours. No results for input(s): AMMONIA in the last 8760  hours. CBC: Recent Labs    09/02/17 1705 12/17/17 2044 12/18/17 0533 12/19/17 0518 12/20/17 0522  WBC 8.8 18.5* 16.9* 12.0* 9.9  NEUTROABS 5.3 16.7*  --   --   --   HGB 12.4* 13.1 11.3* 12.1* 12.3*  HCT 36.8* 39.7 34.0* 36.5* 37.6*  MCV 93.6 94.7 93.9 95.3 94.7  PLT 220 232 197 201 226   Cardiac Enzymes: No results for input(s): CKTOTAL, CKMB, CKMBINDEX, TROPONINI in the last 8760 hours. BNP: Invalid input(s): POCBNP No results found for: HGBA1C Lab Results  Component Value Date   TSH 2.428 11/12/2014   Lab Results  Component Value Date   VITAMINB12 469 12/13/2014   Lab Results  Component Value Date   FOLATE > 20.0 ng/mL 06/12/2006   No results found for: IRON, TIBC, FERRITIN  Imaging and Procedures obtained prior to SNF admission: Dg Chest 2 View  Result Date: 12/17/2017 CLINICAL DATA:  Fever EXAM: CHEST - 2 VIEW COMPARISON:  08/14/2017 FINDINGS: The heart size and mediastinal contours are within normal limits. Both lungs are clear. Degenerative spurring of the spine. IMPRESSION: No active cardiopulmonary disease. Electronically Signed   By: Donavan Foil M.D.   On: 12/17/2017 21:59   Dg Hip Unilat W Or Wo Pelvis 2-3 Views Right  Result Date: 12/17/2017 CLINICAL DATA:  Fall with hip pain EXAM: DG HIP (WITH OR WITHOUT PELVIS) 2-3V RIGHT COMPARISON:  CT 11/21/2017 FINDINGS: Postsurgical changes in the lumbosacral spine. Mild SI joint degenerative changes. Pubic symphysis and rami are intact. No fracture or malalignment. IMPRESSION: No acute osseous abnormality. Electronically Signed   By: Donavan Foil M.D.   On: 12/17/2017 22:00    Assessment/Plan 1. Sepsis due to cellulitis ([place]) -resolved, very little remaining erythema, warmth and swelling of right lateral lower leg -wbc trended down to normal -complete course of doxycycline and monitor  2. Parkinson disease ([place]) -primarily of lower extremities, cont current regimen of sinemet, PT, and add OT to determine ADL  needs as his wife is getting overwhelmed   3. Crohn's disease with other complication, unspecified gastrointestinal tract location [facility]) -not currently acting up, cont imodium and cholestyramine  4. Orthostatic hypotension -cont midodrine for this, slow positional changes encouraged, regular walker use  5. Chronic diastolic CHF (congestive heart failure) (HCC) -stable at this time with his torsemide, potassium  6. Coronary artery disease involving native coronary artery of native  heart without angina pectoris -no concerns at present with chest pain, continues on prn ntg, coreg, asa  7. 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 Erosive esophagitis -cont protonix daily  9. Abnormality of gait -continue PT, added OT as above  Family/ staff Communication: discussed with rehab nurse  Labs/tests ordered:  Add OT  Syre Knerr L. Jarren Para, D.O. [facility] [address] [place] [id number] Phone (Mon-Fri 8am-5pm):  [phone] Call:  [phone]& follow prompts after 5pm & weekends Office Phone:  [phone] Fax:  [phone]

## 2017-12-24 DIAGNOSIS — R278 Other lack of coordination: Secondary | ICD-10-CM | POA: Diagnosis not present

## 2017-12-24 DIAGNOSIS — I951 Orthostatic hypotension: Secondary | ICD-10-CM | POA: Diagnosis not present

## 2017-12-24 DIAGNOSIS — R2689 Other abnormalities of gait and mobility: Secondary | ICD-10-CM | POA: Diagnosis not present

## 2017-12-24 DIAGNOSIS — G20 Parkinson's disease: Secondary | ICD-10-CM | POA: Diagnosis not present

## 2017-12-24 DIAGNOSIS — N3946 Mixed incontinence: Secondary | ICD-10-CM | POA: Diagnosis not present

## 2017-12-24 DIAGNOSIS — R4181 Age-related cognitive decline: Secondary | ICD-10-CM | POA: Diagnosis not present

## 2017-12-24 DIAGNOSIS — Z9181 History of falling: Secondary | ICD-10-CM | POA: Diagnosis not present

## 2017-12-24 DIAGNOSIS — L03115 Cellulitis of right lower limb: Secondary | ICD-10-CM | POA: Diagnosis not present

## 2017-12-24 DIAGNOSIS — A4189 Other specified sepsis: Secondary | ICD-10-CM | POA: Diagnosis not present

## 2017-12-25 DIAGNOSIS — I951 Orthostatic hypotension: Secondary | ICD-10-CM | POA: Diagnosis not present

## 2017-12-25 DIAGNOSIS — G20 Parkinson's disease: Secondary | ICD-10-CM | POA: Diagnosis not present

## 2017-12-25 DIAGNOSIS — R2689 Other abnormalities of gait and mobility: Secondary | ICD-10-CM | POA: Diagnosis not present

## 2017-12-25 DIAGNOSIS — R278 Other lack of coordination: Secondary | ICD-10-CM | POA: Diagnosis not present

## 2017-12-25 DIAGNOSIS — N3946 Mixed incontinence: Secondary | ICD-10-CM | POA: Diagnosis not present

## 2017-12-25 DIAGNOSIS — R4181 Age-related cognitive decline: Secondary | ICD-10-CM | POA: Diagnosis not present

## 2017-12-25 DIAGNOSIS — L03115 Cellulitis of right lower limb: Secondary | ICD-10-CM | POA: Diagnosis not present

## 2017-12-25 DIAGNOSIS — Z9181 History of falling: Secondary | ICD-10-CM | POA: Diagnosis not present

## 2017-12-25 DIAGNOSIS — A4189 Other specified sepsis: Secondary | ICD-10-CM | POA: Diagnosis not present

## 2017-12-26 DIAGNOSIS — Z9181 History of falling: Secondary | ICD-10-CM | POA: Diagnosis not present

## 2017-12-26 DIAGNOSIS — L03115 Cellulitis of right lower limb: Secondary | ICD-10-CM | POA: Diagnosis not present

## 2017-12-26 DIAGNOSIS — I951 Orthostatic hypotension: Secondary | ICD-10-CM | POA: Diagnosis not present

## 2017-12-26 DIAGNOSIS — N3946 Mixed incontinence: Secondary | ICD-10-CM | POA: Diagnosis not present

## 2017-12-26 DIAGNOSIS — R278 Other lack of coordination: Secondary | ICD-10-CM | POA: Diagnosis not present

## 2017-12-26 DIAGNOSIS — R4181 Age-related cognitive decline: Secondary | ICD-10-CM | POA: Diagnosis not present

## 2017-12-26 DIAGNOSIS — R2689 Other abnormalities of gait and mobility: Secondary | ICD-10-CM | POA: Diagnosis not present

## 2017-12-26 DIAGNOSIS — A4189 Other specified sepsis: Secondary | ICD-10-CM | POA: Diagnosis not present

## 2017-12-26 DIAGNOSIS — G20 Parkinson's disease: Secondary | ICD-10-CM | POA: Diagnosis not present

## 2017-12-29 DIAGNOSIS — L03115 Cellulitis of right lower limb: Secondary | ICD-10-CM | POA: Diagnosis not present

## 2017-12-29 DIAGNOSIS — N3946 Mixed incontinence: Secondary | ICD-10-CM | POA: Diagnosis not present

## 2017-12-29 DIAGNOSIS — Z9181 History of falling: Secondary | ICD-10-CM | POA: Diagnosis not present

## 2017-12-29 DIAGNOSIS — R4181 Age-related cognitive decline: Secondary | ICD-10-CM | POA: Diagnosis not present

## 2017-12-29 DIAGNOSIS — R2689 Other abnormalities of gait and mobility: Secondary | ICD-10-CM | POA: Diagnosis not present

## 2017-12-29 DIAGNOSIS — R278 Other lack of coordination: Secondary | ICD-10-CM | POA: Diagnosis not present

## 2017-12-29 DIAGNOSIS — A4189 Other specified sepsis: Secondary | ICD-10-CM | POA: Diagnosis not present

## 2017-12-29 DIAGNOSIS — I951 Orthostatic hypotension: Secondary | ICD-10-CM | POA: Diagnosis not present

## 2017-12-29 DIAGNOSIS — G20 Parkinson's disease: Secondary | ICD-10-CM | POA: Diagnosis not present

## 2017-12-30 DIAGNOSIS — R4181 Age-related cognitive decline: Secondary | ICD-10-CM | POA: Diagnosis not present

## 2017-12-30 DIAGNOSIS — R278 Other lack of coordination: Secondary | ICD-10-CM | POA: Diagnosis not present

## 2017-12-30 DIAGNOSIS — L84 Corns and callosities: Secondary | ICD-10-CM | POA: Diagnosis not present

## 2017-12-30 DIAGNOSIS — G20 Parkinson's disease: Secondary | ICD-10-CM | POA: Diagnosis not present

## 2017-12-30 DIAGNOSIS — L03115 Cellulitis of right lower limb: Secondary | ICD-10-CM | POA: Diagnosis not present

## 2017-12-30 DIAGNOSIS — A4189 Other specified sepsis: Secondary | ICD-10-CM | POA: Diagnosis not present

## 2017-12-30 DIAGNOSIS — B351 Tinea unguium: Secondary | ICD-10-CM | POA: Diagnosis not present

## 2017-12-30 DIAGNOSIS — Z9181 History of falling: Secondary | ICD-10-CM | POA: Diagnosis not present

## 2017-12-30 DIAGNOSIS — R2689 Other abnormalities of gait and mobility: Secondary | ICD-10-CM | POA: Diagnosis not present

## 2017-12-30 DIAGNOSIS — N3946 Mixed incontinence: Secondary | ICD-10-CM | POA: Diagnosis not present

## 2017-12-30 DIAGNOSIS — I951 Orthostatic hypotension: Secondary | ICD-10-CM | POA: Diagnosis not present

## 2017-12-31 DIAGNOSIS — Z9181 History of falling: Secondary | ICD-10-CM | POA: Diagnosis not present

## 2017-12-31 DIAGNOSIS — G20 Parkinson's disease: Secondary | ICD-10-CM | POA: Diagnosis not present

## 2017-12-31 DIAGNOSIS — R4181 Age-related cognitive decline: Secondary | ICD-10-CM | POA: Diagnosis not present

## 2017-12-31 DIAGNOSIS — R278 Other lack of coordination: Secondary | ICD-10-CM | POA: Diagnosis not present

## 2017-12-31 DIAGNOSIS — R2689 Other abnormalities of gait and mobility: Secondary | ICD-10-CM | POA: Diagnosis not present

## 2017-12-31 DIAGNOSIS — I951 Orthostatic hypotension: Secondary | ICD-10-CM | POA: Diagnosis not present

## 2017-12-31 DIAGNOSIS — L03115 Cellulitis of right lower limb: Secondary | ICD-10-CM | POA: Diagnosis not present

## 2017-12-31 DIAGNOSIS — N3946 Mixed incontinence: Secondary | ICD-10-CM | POA: Diagnosis not present

## 2017-12-31 DIAGNOSIS — A4189 Other specified sepsis: Secondary | ICD-10-CM | POA: Diagnosis not present

## 2018-01-01 DIAGNOSIS — Z9181 History of falling: Secondary | ICD-10-CM | POA: Diagnosis not present

## 2018-01-01 DIAGNOSIS — L03115 Cellulitis of right lower limb: Secondary | ICD-10-CM | POA: Diagnosis not present

## 2018-01-01 DIAGNOSIS — R278 Other lack of coordination: Secondary | ICD-10-CM | POA: Diagnosis not present

## 2018-01-01 DIAGNOSIS — G20 Parkinson's disease: Secondary | ICD-10-CM | POA: Diagnosis not present

## 2018-01-01 DIAGNOSIS — N3946 Mixed incontinence: Secondary | ICD-10-CM | POA: Diagnosis not present

## 2018-01-01 DIAGNOSIS — R4181 Age-related cognitive decline: Secondary | ICD-10-CM | POA: Diagnosis not present

## 2018-01-01 DIAGNOSIS — A4189 Other specified sepsis: Secondary | ICD-10-CM | POA: Diagnosis not present

## 2018-01-01 DIAGNOSIS — I951 Orthostatic hypotension: Secondary | ICD-10-CM | POA: Diagnosis not present

## 2018-01-01 DIAGNOSIS — R2689 Other abnormalities of gait and mobility: Secondary | ICD-10-CM | POA: Diagnosis not present

## 2018-01-02 DIAGNOSIS — Z9181 History of falling: Secondary | ICD-10-CM | POA: Diagnosis not present

## 2018-01-02 DIAGNOSIS — A4189 Other specified sepsis: Secondary | ICD-10-CM | POA: Diagnosis not present

## 2018-01-02 DIAGNOSIS — R2689 Other abnormalities of gait and mobility: Secondary | ICD-10-CM | POA: Diagnosis not present

## 2018-01-02 DIAGNOSIS — R4181 Age-related cognitive decline: Secondary | ICD-10-CM | POA: Diagnosis not present

## 2018-01-02 DIAGNOSIS — R278 Other lack of coordination: Secondary | ICD-10-CM | POA: Diagnosis not present

## 2018-01-02 DIAGNOSIS — N3946 Mixed incontinence: Secondary | ICD-10-CM | POA: Diagnosis not present

## 2018-01-02 DIAGNOSIS — I951 Orthostatic hypotension: Secondary | ICD-10-CM | POA: Diagnosis not present

## 2018-01-02 DIAGNOSIS — L03115 Cellulitis of right lower limb: Secondary | ICD-10-CM | POA: Diagnosis not present

## 2018-01-02 DIAGNOSIS — G20 Parkinson's disease: Secondary | ICD-10-CM | POA: Diagnosis not present

## 2018-01-05 DIAGNOSIS — R278 Other lack of coordination: Secondary | ICD-10-CM | POA: Diagnosis not present

## 2018-01-05 DIAGNOSIS — G20 Parkinson's disease: Secondary | ICD-10-CM | POA: Diagnosis not present

## 2018-01-05 DIAGNOSIS — R4181 Age-related cognitive decline: Secondary | ICD-10-CM | POA: Diagnosis not present

## 2018-01-05 DIAGNOSIS — N3946 Mixed incontinence: Secondary | ICD-10-CM | POA: Diagnosis not present

## 2018-01-05 DIAGNOSIS — Z9181 History of falling: Secondary | ICD-10-CM | POA: Diagnosis not present

## 2018-01-05 DIAGNOSIS — L03115 Cellulitis of right lower limb: Secondary | ICD-10-CM | POA: Diagnosis not present

## 2018-01-05 DIAGNOSIS — I951 Orthostatic hypotension: Secondary | ICD-10-CM | POA: Diagnosis not present

## 2018-01-05 DIAGNOSIS — A4189 Other specified sepsis: Secondary | ICD-10-CM | POA: Diagnosis not present

## 2018-01-05 DIAGNOSIS — R2689 Other abnormalities of gait and mobility: Secondary | ICD-10-CM | POA: Diagnosis not present

## 2018-01-06 DIAGNOSIS — R278 Other lack of coordination: Secondary | ICD-10-CM | POA: Diagnosis not present

## 2018-01-06 DIAGNOSIS — L03115 Cellulitis of right lower limb: Secondary | ICD-10-CM | POA: Diagnosis not present

## 2018-01-06 DIAGNOSIS — A4189 Other specified sepsis: Secondary | ICD-10-CM | POA: Diagnosis not present

## 2018-01-06 DIAGNOSIS — N3946 Mixed incontinence: Secondary | ICD-10-CM | POA: Diagnosis not present

## 2018-01-06 DIAGNOSIS — I951 Orthostatic hypotension: Secondary | ICD-10-CM | POA: Diagnosis not present

## 2018-01-06 DIAGNOSIS — R4181 Age-related cognitive decline: Secondary | ICD-10-CM | POA: Diagnosis not present

## 2018-01-06 DIAGNOSIS — R2689 Other abnormalities of gait and mobility: Secondary | ICD-10-CM | POA: Diagnosis not present

## 2018-01-06 DIAGNOSIS — G20 Parkinson's disease: Secondary | ICD-10-CM | POA: Diagnosis not present

## 2018-01-06 DIAGNOSIS — Z9181 History of falling: Secondary | ICD-10-CM | POA: Diagnosis not present

## 2018-01-07 DIAGNOSIS — R2689 Other abnormalities of gait and mobility: Secondary | ICD-10-CM | POA: Diagnosis not present

## 2018-01-07 DIAGNOSIS — R278 Other lack of coordination: Secondary | ICD-10-CM | POA: Diagnosis not present

## 2018-01-07 DIAGNOSIS — Z9181 History of falling: Secondary | ICD-10-CM | POA: Diagnosis not present

## 2018-01-07 DIAGNOSIS — R4181 Age-related cognitive decline: Secondary | ICD-10-CM | POA: Diagnosis not present

## 2018-01-07 DIAGNOSIS — G20 Parkinson's disease: Secondary | ICD-10-CM | POA: Diagnosis not present

## 2018-01-07 DIAGNOSIS — N3946 Mixed incontinence: Secondary | ICD-10-CM | POA: Diagnosis not present

## 2018-01-07 DIAGNOSIS — A4189 Other specified sepsis: Secondary | ICD-10-CM | POA: Diagnosis not present

## 2018-01-07 DIAGNOSIS — L03115 Cellulitis of right lower limb: Secondary | ICD-10-CM | POA: Diagnosis not present

## 2018-01-07 DIAGNOSIS — I951 Orthostatic hypotension: Secondary | ICD-10-CM | POA: Diagnosis not present

## 2018-01-08 DIAGNOSIS — N3946 Mixed incontinence: Secondary | ICD-10-CM | POA: Diagnosis not present

## 2018-01-08 DIAGNOSIS — G20 Parkinson's disease: Secondary | ICD-10-CM | POA: Diagnosis not present

## 2018-01-08 DIAGNOSIS — I951 Orthostatic hypotension: Secondary | ICD-10-CM | POA: Diagnosis not present

## 2018-01-08 DIAGNOSIS — L03115 Cellulitis of right lower limb: Secondary | ICD-10-CM | POA: Diagnosis not present

## 2018-01-08 DIAGNOSIS — A4189 Other specified sepsis: Secondary | ICD-10-CM | POA: Diagnosis not present

## 2018-01-08 DIAGNOSIS — Z9181 History of falling: Secondary | ICD-10-CM | POA: Diagnosis not present

## 2018-01-08 DIAGNOSIS — R278 Other lack of coordination: Secondary | ICD-10-CM | POA: Diagnosis not present

## 2018-01-08 DIAGNOSIS — R2689 Other abnormalities of gait and mobility: Secondary | ICD-10-CM | POA: Diagnosis not present

## 2018-01-08 DIAGNOSIS — R4181 Age-related cognitive decline: Secondary | ICD-10-CM | POA: Diagnosis not present

## 2018-01-09 DIAGNOSIS — G20 Parkinson's disease: Secondary | ICD-10-CM | POA: Diagnosis not present

## 2018-01-09 DIAGNOSIS — R278 Other lack of coordination: Secondary | ICD-10-CM | POA: Diagnosis not present

## 2018-01-09 DIAGNOSIS — R4181 Age-related cognitive decline: Secondary | ICD-10-CM | POA: Diagnosis not present

## 2018-01-09 DIAGNOSIS — L03115 Cellulitis of right lower limb: Secondary | ICD-10-CM | POA: Diagnosis not present

## 2018-01-09 DIAGNOSIS — Z9181 History of falling: Secondary | ICD-10-CM | POA: Diagnosis not present

## 2018-01-09 DIAGNOSIS — N3946 Mixed incontinence: Secondary | ICD-10-CM | POA: Diagnosis not present

## 2018-01-09 DIAGNOSIS — I951 Orthostatic hypotension: Secondary | ICD-10-CM | POA: Diagnosis not present

## 2018-01-09 DIAGNOSIS — A4189 Other specified sepsis: Secondary | ICD-10-CM | POA: Diagnosis not present

## 2018-01-09 DIAGNOSIS — R2689 Other abnormalities of gait and mobility: Secondary | ICD-10-CM | POA: Diagnosis not present

## 2018-01-12 DIAGNOSIS — R2689 Other abnormalities of gait and mobility: Secondary | ICD-10-CM | POA: Diagnosis not present

## 2018-01-12 DIAGNOSIS — I951 Orthostatic hypotension: Secondary | ICD-10-CM | POA: Diagnosis not present

## 2018-01-12 DIAGNOSIS — R278 Other lack of coordination: Secondary | ICD-10-CM | POA: Diagnosis not present

## 2018-01-12 DIAGNOSIS — A4189 Other specified sepsis: Secondary | ICD-10-CM | POA: Diagnosis not present

## 2018-01-12 DIAGNOSIS — R4181 Age-related cognitive decline: Secondary | ICD-10-CM | POA: Diagnosis not present

## 2018-01-12 DIAGNOSIS — L03115 Cellulitis of right lower limb: Secondary | ICD-10-CM | POA: Diagnosis not present

## 2018-01-12 DIAGNOSIS — N3946 Mixed incontinence: Secondary | ICD-10-CM | POA: Diagnosis not present

## 2018-01-12 DIAGNOSIS — G20 Parkinson's disease: Secondary | ICD-10-CM | POA: Diagnosis not present

## 2018-01-12 DIAGNOSIS — Z9181 History of falling: Secondary | ICD-10-CM | POA: Diagnosis not present

## 2018-01-13 DIAGNOSIS — A4189 Other specified sepsis: Secondary | ICD-10-CM | POA: Diagnosis not present

## 2018-01-13 DIAGNOSIS — G20 Parkinson's disease: Secondary | ICD-10-CM | POA: Diagnosis not present

## 2018-01-13 DIAGNOSIS — R4181 Age-related cognitive decline: Secondary | ICD-10-CM | POA: Diagnosis not present

## 2018-01-13 DIAGNOSIS — R278 Other lack of coordination: Secondary | ICD-10-CM | POA: Diagnosis not present

## 2018-01-13 DIAGNOSIS — I951 Orthostatic hypotension: Secondary | ICD-10-CM | POA: Diagnosis not present

## 2018-01-13 DIAGNOSIS — N3946 Mixed incontinence: Secondary | ICD-10-CM | POA: Diagnosis not present

## 2018-01-13 DIAGNOSIS — L03115 Cellulitis of right lower limb: Secondary | ICD-10-CM | POA: Diagnosis not present

## 2018-01-13 DIAGNOSIS — R2689 Other abnormalities of gait and mobility: Secondary | ICD-10-CM | POA: Diagnosis not present

## 2018-01-13 DIAGNOSIS — Z9181 History of falling: Secondary | ICD-10-CM | POA: Diagnosis not present

## 2018-01-14 DIAGNOSIS — A4189 Other specified sepsis: Secondary | ICD-10-CM | POA: Diagnosis not present

## 2018-01-14 DIAGNOSIS — G20 Parkinson's disease: Secondary | ICD-10-CM | POA: Diagnosis not present

## 2018-01-14 DIAGNOSIS — R278 Other lack of coordination: Secondary | ICD-10-CM | POA: Diagnosis not present

## 2018-01-14 DIAGNOSIS — R2689 Other abnormalities of gait and mobility: Secondary | ICD-10-CM | POA: Diagnosis not present

## 2018-01-14 DIAGNOSIS — L03115 Cellulitis of right lower limb: Secondary | ICD-10-CM | POA: Diagnosis not present

## 2018-01-14 DIAGNOSIS — N3946 Mixed incontinence: Secondary | ICD-10-CM | POA: Diagnosis not present

## 2018-01-14 DIAGNOSIS — R4181 Age-related cognitive decline: Secondary | ICD-10-CM | POA: Diagnosis not present

## 2018-01-14 DIAGNOSIS — I951 Orthostatic hypotension: Secondary | ICD-10-CM | POA: Diagnosis not present

## 2018-01-14 DIAGNOSIS — Z9181 History of falling: Secondary | ICD-10-CM | POA: Diagnosis not present

## 2018-01-15 DIAGNOSIS — N3946 Mixed incontinence: Secondary | ICD-10-CM | POA: Diagnosis not present

## 2018-01-15 DIAGNOSIS — R4181 Age-related cognitive decline: Secondary | ICD-10-CM | POA: Diagnosis not present

## 2018-01-15 DIAGNOSIS — R278 Other lack of coordination: Secondary | ICD-10-CM | POA: Diagnosis not present

## 2018-01-15 DIAGNOSIS — I951 Orthostatic hypotension: Secondary | ICD-10-CM | POA: Diagnosis not present

## 2018-01-15 DIAGNOSIS — A4189 Other specified sepsis: Secondary | ICD-10-CM | POA: Diagnosis not present

## 2018-01-15 DIAGNOSIS — R2689 Other abnormalities of gait and mobility: Secondary | ICD-10-CM | POA: Diagnosis not present

## 2018-01-15 DIAGNOSIS — Z9181 History of falling: Secondary | ICD-10-CM | POA: Diagnosis not present

## 2018-01-15 DIAGNOSIS — G20 Parkinson's disease: Secondary | ICD-10-CM | POA: Diagnosis not present

## 2018-01-15 DIAGNOSIS — L03115 Cellulitis of right lower limb: Secondary | ICD-10-CM | POA: Diagnosis not present

## 2018-01-16 DIAGNOSIS — L03115 Cellulitis of right lower limb: Secondary | ICD-10-CM | POA: Diagnosis not present

## 2018-01-16 DIAGNOSIS — N3946 Mixed incontinence: Secondary | ICD-10-CM | POA: Diagnosis not present

## 2018-01-16 DIAGNOSIS — R278 Other lack of coordination: Secondary | ICD-10-CM | POA: Diagnosis not present

## 2018-01-16 DIAGNOSIS — Z9181 History of falling: Secondary | ICD-10-CM | POA: Diagnosis not present

## 2018-01-16 DIAGNOSIS — I951 Orthostatic hypotension: Secondary | ICD-10-CM | POA: Diagnosis not present

## 2018-01-16 DIAGNOSIS — R4181 Age-related cognitive decline: Secondary | ICD-10-CM | POA: Diagnosis not present

## 2018-01-16 DIAGNOSIS — R2689 Other abnormalities of gait and mobility: Secondary | ICD-10-CM | POA: Diagnosis not present

## 2018-01-16 DIAGNOSIS — A4189 Other specified sepsis: Secondary | ICD-10-CM | POA: Diagnosis not present

## 2018-01-16 DIAGNOSIS — G20 Parkinson's disease: Secondary | ICD-10-CM | POA: Diagnosis not present

## 2018-01-19 DIAGNOSIS — R4181 Age-related cognitive decline: Secondary | ICD-10-CM | POA: Diagnosis not present

## 2018-01-19 DIAGNOSIS — I951 Orthostatic hypotension: Secondary | ICD-10-CM | POA: Diagnosis not present

## 2018-01-19 DIAGNOSIS — A4189 Other specified sepsis: Secondary | ICD-10-CM | POA: Diagnosis not present

## 2018-01-19 DIAGNOSIS — G20 Parkinson's disease: Secondary | ICD-10-CM | POA: Diagnosis not present

## 2018-01-19 DIAGNOSIS — R2689 Other abnormalities of gait and mobility: Secondary | ICD-10-CM | POA: Diagnosis not present

## 2018-01-19 DIAGNOSIS — L03115 Cellulitis of right lower limb: Secondary | ICD-10-CM | POA: Diagnosis not present

## 2018-01-19 DIAGNOSIS — Z9181 History of falling: Secondary | ICD-10-CM | POA: Diagnosis not present

## 2018-01-19 DIAGNOSIS — R278 Other lack of coordination: Secondary | ICD-10-CM | POA: Diagnosis not present

## 2018-01-19 DIAGNOSIS — N3946 Mixed incontinence: Secondary | ICD-10-CM | POA: Diagnosis not present

## 2018-01-20 DIAGNOSIS — G20 Parkinson's disease: Secondary | ICD-10-CM | POA: Diagnosis not present

## 2018-01-20 DIAGNOSIS — N3946 Mixed incontinence: Secondary | ICD-10-CM | POA: Diagnosis not present

## 2018-01-20 DIAGNOSIS — R2689 Other abnormalities of gait and mobility: Secondary | ICD-10-CM | POA: Diagnosis not present

## 2018-01-20 DIAGNOSIS — A4189 Other specified sepsis: Secondary | ICD-10-CM | POA: Diagnosis not present

## 2018-01-20 DIAGNOSIS — I951 Orthostatic hypotension: Secondary | ICD-10-CM | POA: Diagnosis not present

## 2018-01-20 DIAGNOSIS — L03115 Cellulitis of right lower limb: Secondary | ICD-10-CM | POA: Diagnosis not present

## 2018-01-20 DIAGNOSIS — R4181 Age-related cognitive decline: Secondary | ICD-10-CM | POA: Diagnosis not present

## 2018-01-20 DIAGNOSIS — R278 Other lack of coordination: Secondary | ICD-10-CM | POA: Diagnosis not present

## 2018-01-20 DIAGNOSIS — Z9181 History of falling: Secondary | ICD-10-CM | POA: Diagnosis not present

## 2018-01-21 DIAGNOSIS — N3946 Mixed incontinence: Secondary | ICD-10-CM | POA: Diagnosis not present

## 2018-01-21 DIAGNOSIS — Z9181 History of falling: Secondary | ICD-10-CM | POA: Diagnosis not present

## 2018-01-21 DIAGNOSIS — G20 Parkinson's disease: Secondary | ICD-10-CM | POA: Diagnosis not present

## 2018-01-21 DIAGNOSIS — R4181 Age-related cognitive decline: Secondary | ICD-10-CM | POA: Diagnosis not present

## 2018-01-21 DIAGNOSIS — R278 Other lack of coordination: Secondary | ICD-10-CM | POA: Diagnosis not present

## 2018-01-21 DIAGNOSIS — I951 Orthostatic hypotension: Secondary | ICD-10-CM | POA: Diagnosis not present

## 2018-01-21 DIAGNOSIS — R2689 Other abnormalities of gait and mobility: Secondary | ICD-10-CM | POA: Diagnosis not present

## 2018-01-21 DIAGNOSIS — A4189 Other specified sepsis: Secondary | ICD-10-CM | POA: Diagnosis not present

## 2018-01-21 DIAGNOSIS — L03115 Cellulitis of right lower limb: Secondary | ICD-10-CM | POA: Diagnosis not present

## 2018-01-22 DIAGNOSIS — R278 Other lack of coordination: Secondary | ICD-10-CM | POA: Diagnosis not present

## 2018-01-22 DIAGNOSIS — N3946 Mixed incontinence: Secondary | ICD-10-CM | POA: Diagnosis not present

## 2018-01-22 DIAGNOSIS — Z9181 History of falling: Secondary | ICD-10-CM | POA: Diagnosis not present

## 2018-01-22 DIAGNOSIS — G20 Parkinson's disease: Secondary | ICD-10-CM | POA: Diagnosis not present

## 2018-01-22 DIAGNOSIS — L03115 Cellulitis of right lower limb: Secondary | ICD-10-CM | POA: Diagnosis not present

## 2018-01-22 DIAGNOSIS — R2689 Other abnormalities of gait and mobility: Secondary | ICD-10-CM | POA: Diagnosis not present

## 2018-01-22 DIAGNOSIS — I951 Orthostatic hypotension: Secondary | ICD-10-CM | POA: Diagnosis not present

## 2018-01-22 DIAGNOSIS — R4181 Age-related cognitive decline: Secondary | ICD-10-CM | POA: Diagnosis not present

## 2018-01-22 DIAGNOSIS — A4189 Other specified sepsis: Secondary | ICD-10-CM | POA: Diagnosis not present

## 2018-01-23 DIAGNOSIS — R4181 Age-related cognitive decline: Secondary | ICD-10-CM | POA: Diagnosis not present

## 2018-01-23 DIAGNOSIS — G20 Parkinson's disease: Secondary | ICD-10-CM | POA: Diagnosis not present

## 2018-01-23 DIAGNOSIS — Z9181 History of falling: Secondary | ICD-10-CM | POA: Diagnosis not present

## 2018-01-23 DIAGNOSIS — R2689 Other abnormalities of gait and mobility: Secondary | ICD-10-CM | POA: Diagnosis not present

## 2018-01-23 DIAGNOSIS — N3946 Mixed incontinence: Secondary | ICD-10-CM | POA: Diagnosis not present

## 2018-01-23 DIAGNOSIS — I951 Orthostatic hypotension: Secondary | ICD-10-CM | POA: Diagnosis not present

## 2018-01-23 DIAGNOSIS — A4189 Other specified sepsis: Secondary | ICD-10-CM | POA: Diagnosis not present

## 2018-01-23 DIAGNOSIS — L03115 Cellulitis of right lower limb: Secondary | ICD-10-CM | POA: Diagnosis not present

## 2018-01-23 DIAGNOSIS — R278 Other lack of coordination: Secondary | ICD-10-CM | POA: Diagnosis not present

## 2018-01-26 DIAGNOSIS — R278 Other lack of coordination: Secondary | ICD-10-CM | POA: Diagnosis not present

## 2018-01-26 DIAGNOSIS — R2689 Other abnormalities of gait and mobility: Secondary | ICD-10-CM | POA: Diagnosis not present

## 2018-01-26 DIAGNOSIS — R4181 Age-related cognitive decline: Secondary | ICD-10-CM | POA: Diagnosis not present

## 2018-01-26 DIAGNOSIS — A4189 Other specified sepsis: Secondary | ICD-10-CM | POA: Diagnosis not present

## 2018-01-26 DIAGNOSIS — N3946 Mixed incontinence: Secondary | ICD-10-CM | POA: Diagnosis not present

## 2018-01-26 DIAGNOSIS — I951 Orthostatic hypotension: Secondary | ICD-10-CM | POA: Diagnosis not present

## 2018-01-26 DIAGNOSIS — G20 Parkinson's disease: Secondary | ICD-10-CM | POA: Diagnosis not present

## 2018-01-26 DIAGNOSIS — L03115 Cellulitis of right lower limb: Secondary | ICD-10-CM | POA: Diagnosis not present

## 2018-01-26 DIAGNOSIS — Z9181 History of falling: Secondary | ICD-10-CM | POA: Diagnosis not present

## 2018-01-27 DIAGNOSIS — R278 Other lack of coordination: Secondary | ICD-10-CM | POA: Diagnosis not present

## 2018-01-27 DIAGNOSIS — R4181 Age-related cognitive decline: Secondary | ICD-10-CM | POA: Diagnosis not present

## 2018-01-27 DIAGNOSIS — A4189 Other specified sepsis: Secondary | ICD-10-CM | POA: Diagnosis not present

## 2018-01-27 DIAGNOSIS — N3946 Mixed incontinence: Secondary | ICD-10-CM | POA: Diagnosis not present

## 2018-01-27 DIAGNOSIS — Z9181 History of falling: Secondary | ICD-10-CM | POA: Diagnosis not present

## 2018-01-27 DIAGNOSIS — L03115 Cellulitis of right lower limb: Secondary | ICD-10-CM | POA: Diagnosis not present

## 2018-01-27 DIAGNOSIS — R2689 Other abnormalities of gait and mobility: Secondary | ICD-10-CM | POA: Diagnosis not present

## 2018-01-27 DIAGNOSIS — I951 Orthostatic hypotension: Secondary | ICD-10-CM | POA: Diagnosis not present

## 2018-01-27 DIAGNOSIS — G20 Parkinson's disease: Secondary | ICD-10-CM | POA: Diagnosis not present

## 2018-01-28 DIAGNOSIS — R2689 Other abnormalities of gait and mobility: Secondary | ICD-10-CM | POA: Diagnosis not present

## 2018-01-28 DIAGNOSIS — N3946 Mixed incontinence: Secondary | ICD-10-CM | POA: Diagnosis not present

## 2018-01-28 DIAGNOSIS — I951 Orthostatic hypotension: Secondary | ICD-10-CM | POA: Diagnosis not present

## 2018-01-28 DIAGNOSIS — L03115 Cellulitis of right lower limb: Secondary | ICD-10-CM | POA: Diagnosis not present

## 2018-01-28 DIAGNOSIS — R278 Other lack of coordination: Secondary | ICD-10-CM | POA: Diagnosis not present

## 2018-01-28 DIAGNOSIS — G20 Parkinson's disease: Secondary | ICD-10-CM | POA: Diagnosis not present

## 2018-01-28 DIAGNOSIS — R4181 Age-related cognitive decline: Secondary | ICD-10-CM | POA: Diagnosis not present

## 2018-01-28 DIAGNOSIS — Z9181 History of falling: Secondary | ICD-10-CM | POA: Diagnosis not present

## 2018-01-28 DIAGNOSIS — A4189 Other specified sepsis: Secondary | ICD-10-CM | POA: Diagnosis not present

## 2018-01-29 DIAGNOSIS — A4189 Other specified sepsis: Secondary | ICD-10-CM | POA: Diagnosis not present

## 2018-01-29 DIAGNOSIS — R4181 Age-related cognitive decline: Secondary | ICD-10-CM | POA: Diagnosis not present

## 2018-01-29 DIAGNOSIS — N3946 Mixed incontinence: Secondary | ICD-10-CM | POA: Diagnosis not present

## 2018-01-29 DIAGNOSIS — R2689 Other abnormalities of gait and mobility: Secondary | ICD-10-CM | POA: Diagnosis not present

## 2018-01-29 DIAGNOSIS — R278 Other lack of coordination: Secondary | ICD-10-CM | POA: Diagnosis not present

## 2018-01-29 DIAGNOSIS — G20 Parkinson's disease: Secondary | ICD-10-CM | POA: Diagnosis not present

## 2018-01-29 DIAGNOSIS — L03115 Cellulitis of right lower limb: Secondary | ICD-10-CM | POA: Diagnosis not present

## 2018-01-29 DIAGNOSIS — Z9181 History of falling: Secondary | ICD-10-CM | POA: Diagnosis not present

## 2018-01-29 DIAGNOSIS — I951 Orthostatic hypotension: Secondary | ICD-10-CM | POA: Diagnosis not present

## 2018-01-29 NOTE — Progress Notes (Signed)
Cardiology Office Note Date:  01/30/2018  Patient ID:  [name], [name] Apr 09, 1928, MRN [id number] PCP:  Shon Baton, MD  Cardiologist:  Dr. [name]   Chief Complaint: CAD  History of Present Illness: [name] [name] [name] is a 84 y.o. male with history of CAD (s/p anterior STEMI 04/2007 with BMS-> LAD, patent by cath in 12/2011, low risk nuc 04/2014), chronic dCHF, HTN, HLD, Crohn's disease, GERD, Parkinson's disease, cellulitis, orthostatic hypotension requiring midodrine, GERD, RBBB, fatty liver, gait disorder, chronic lower extremity edema who presents for follow up.   He was seen in the ED in May with left leg cellulitis. In September he was admitted with right leg cellulitis having failed outpatient antibiotics. Dopplers negative for DVT.   He is seen with his wife today. He is now in skilled Rehab at [facility]. Still getting PT. Weak. Still has difficulty walking. No swelling, fever, dyspnea or chest pain. Getting a better diet in Rehab.    Past Medical History:  Diagnosis Date  . Cellulitis   . Chronic diastolic CHF (congestive heart failure) (HCC)    a. EF initially 35-40% after MI 1/09; b. echo 7/08: EF 60%;  c. 11/2014 Echo: EF 65-70%, Gr 1 DD, mild MR.  . Coronary artery disease    a. s/p anterior STEMI 04/2007 with BMS-> LAD;  b. Cath 12/2011 patent stent;  c. low risk nuc 04/2014.  . Crohn's disease ([place])   . Diaphragmatic hernia without mention of obstruction or gangrene   . Diverticulosis of colon (without mention of hemorrhage)   . Fatty liver    a. on ultrasound of 10/2009  . Flatulence, eructation, and gas pain   . Fungal infection   . Gait disorder   . GERD (gastroesophageal reflux disease)   . HOH (hard of hearing)    Hearing aids  . Hyperlipidemia   . Hypertension   . Ischemic cardiomyopathy    a. EF initially 35-40% after MI 1/09; b. echo 7/08: EF 60%;  c. 11/2014 Echo: EF 65-70%, Gr 1 DD, mild MR.  . Melanoma ([place])   . Memory disorder   . Orthostatic hypotension    . Osteoporosis   . Other chronic nonalcoholic liver disease   . Other esophagitis   . Parkinson's disease ([name] [name])   . RBBB 09/02/2013  . Regional enteritis of small intestine ([place])   . Renal calculi   . Restless leg syndrome 03/22/2015  . Ulcerative (chronic) ileocolitis ([place])   . Urinary incontinence     Past Surgical History:  Procedure Laterality Date  . APPENDECTOMY    . BACK SURGERY     L3, L4, L5  . CARDIAC CATHETERIZATION  09/25/06   EF 35-40% but more recently 60%  . CATARACT EXTRACTION     Bilateral  . CHOLECYSTECTOMY    . Coronary artery stent placement    . Melanoma resection    . Partial bowel resection    . TONSILLECTOMY      Current Outpatient Medications  Medication Sig Dispense Refill  . acetaminophen (TYLENOL) 325 MG tablet Take 650 mg by mouth every 6 (six) hours as needed for pain.    Marland Kitchen ADVAIR DISKUS 250-50 MCG/DOSE AEPB Inhale 1 puff into the lungs daily. For shortness of breath    . aspirin EC 81 MG tablet Take 81 mg by mouth daily.    Marland Kitchen atorvastatin (LIPITOR) 40 MG tablet Take 40 mg by mouth daily.    . calcium carbonate (CALCIUM 600) 600 MG  TABS tablet Take 600 mg by mouth daily with breakfast.    . carbidopa-levodopa (SINEMET CR) 50-200 MG tablet Take 1 tablet by mouth 4 (four) times daily. 120 tablet 5  . carvedilol (COREG) 3.125 MG tablet take one tablet ONLY if systolic BP greater than 106    . Cholecalciferol (VITAMIN D-3) 1000 UNITS CAPS Take 2,000 Units by mouth daily.     . cholestyramine (QUESTRAN) 4 g packet TAKE 1 PACKET BY MOUTH TWICE DAILY WITH A MEAL 60 each 11  . clotrimazole (MYCELEX) 10 MG troche Take 10 mg by mouth 5 (five) times daily.    Marland Kitchen donepezil (ARICEPT) 10 MG tablet TAKE ONE TABLET AT BEDTIME 90 tablet 0  . doxycycline (VIBRA-TABS) 100 MG tablet Take 100 mg by mouth 2 (two) times daily.    . fluorouracil (EFUDEX) 5 % cream Apply 1 application topically as directed. By dermatologist    . guaiFENesin-dextromethorphan (ROBITUSSIN  DM) 100-10 MG/5ML syrup Take 10 mLs by mouth every 6 (six) hours as needed for cough.    Marland Kitchen ipratropium (ATROVENT) 0.03 % nasal spray Place 2 sprays into both nostrils 3 (three) times daily.    Marland Kitchen ipratropium-albuterol (DUONEB) 0.5-2.5 (3) MG/3ML SOLN Take 3 mLs by nebulization every 6 (six) hours as needed. 360 mL 0  . loperamide (IMODIUM A-D) 2 MG tablet Take 2 mg by mouth 2 (two) times daily as needed for diarrhea or loose stools.    . Melatonin 5 MG TABS Take by mouth.    . midodrine (PROAMATINE) 5 MG tablet Take 1 tablet (5 mg total) by mouth 2 (two) times daily with a meal. 60 tablet 9  . Multiple Vitamins-Minerals (CENTRUM SILVER PO) Take 1 tablet by mouth daily.    . mupirocin cream (BACTROBAN) 2 % Apply 1 application topically daily as needed (skin irritation).    . mupirocin ointment (BACTROBAN) 2 % Place 1 application into the nose as needed.    . nitroGLYCERIN (NITROSTAT) 0.4 MG SL tablet Place 1 tablet (0.4 mg total) under the tongue every 5 (five) minutes as needed for chest pain. PT OVERDUE FOR OV PLEASE CALL FOR APPT 25 tablet 0  . pantoprazole (PROTONIX) 40 MG tablet Take 40 mg by mouth daily.     . potassium chloride SA (K-DUR,KLOR-CON) 20 MEQ tablet Take 40 mEq by mouth daily.    . Probiotic Product (ALIGN) 4 MG CAPS Take by mouth.    . torsemide (DEMADEX) 20 MG tablet Take 1 tablet (20 mg total) by mouth daily. 180 tablet 3  . vitamin B-12 (CYANOCOBALAMIN) 1000 MCG tablet Take 1,000 mcg by mouth daily.     No current facility-administered medications for this visit.     Allergies:   Patient has no known allergies.   Social History:  The patient  reports that he has never smoked. He has never used smokeless tobacco. He reports that he drinks about 2.0 - 3.0 standard drinks of alcohol per week. He reports that he does not use drugs.   Family History:  The patient's family history includes Colon cancer in his brother; Coronary artery disease in his unknown relative; Emphysema in  his father; Heart attack in his mother; Heart disease in his brother and brother; Heart failure in his mother; Hypertension in his mother.  ROS:  Please see the history of present illness.   All other systems are reviewed and otherwise negative.   PHYSICAL EXAM:  VS:  BP 96/60   Pulse 89   Ht  5' 7"  (1.702 m)   Wt 166 lb 12.8 oz (75.7 kg)   BMI 26.12 kg/m  BMI: Body mass index is 26.12 kg/m. GENERAL:  Well appearing, elderly WM in NAD, seen in a wheelchair.  HEENT:  PERRL, EOMI, sclera are clear. Oropharynx is clear. NECK:  No jugular venous distention, carotid upstroke brisk and symmetric, no bruits, no thyromegaly or adenopathy LUNGS:  Clear to auscultation bilaterally CHEST:  Unremarkable HEART:  RRR,  PMI not displaced or sustained,S1 and S2 within normal limits, no S3, no S4: no clicks, no rubs, no murmurs ABD:  Soft, nontender. Distended. BS +, no masses or bruits. No hepatomegaly, no splenomegaly EXT:  2 + pulses throughout, no edema or redness, no cyanosis no clubbing SKIN:  Warm and dry.  No rashes NEURO:  Alert and oriented x 3. Cranial nerves II through XII intact. PSYCH:  Cognitively intact  EKG:  Is not done today.    Recent Labs: 12/18/2017: ALT 13 12/20/2017: BUN 21; Creatinine, Ser 1.06; Hemoglobin 12.3; Magnesium 2.0; Platelets 226; Potassium 3.5; Sodium 139  No results found for requested labs within last 8760 hours.   CrCl cannot be calculated (Patient's most recent lab result is older than the maximum 21 days allowed.).   Labs dated 09/03/16 cholesterol 132, triglycerides 113, LDL 47, HDL 62. LFTs and TSH normal  A1c 5.9%,  BUN 28, creatinine 1.2. PSA 2.33. Dated 09/05/17: cholesterol 116, triglycerides 72, HDL 50, LDL 52. A1c 6%.   Wt Readings from Last 3 Encounters:  01/30/18 166 lb 12.8 oz (75.7 kg)  12/23/17 159 lb (72.1 kg)  12/20/17 159 lb 6.3 oz (72.3 kg)     Other studies reviewed: Additional studies/records reviewed today include:  Echo 11/14/15:  Study Conclusions  - Left ventricle: The cavity size was normal. There was mild focal   basal and mild concentric hypertrophy of the septum. Systolic   function was vigorous. The estimated ejection fraction was in the   range of 65% to 70%. Wall motion was normal; there were no   regional wall motion abnormalities. Doppler parameters are   consistent with abnormal left ventricular relaxation (grade 1   diastolic dysfunction). - Mitral valve: There was mild regurgitation.  Impressions:  - Compared to the prior study, there has been no significant   interval change.  ASSESSMENT AND PLAN:  1. Chronic diastolic CHF -well compensated. No edema on exam and lungs are clear. Continue current therapy.  2. History of essential HTN with orthostatic hypotension. BP well controlled on current regimen and he has no orthostatic symptoms. Holds meds if BP too low.  3. CAD s/p anterior MI 2009 with BMS to LAD. Cath 2013 showed patent stent. Normal Myoview Jan 2016.  Asymptomatic. 4. Hyponatremia  5.   Parkinson's disease 6.   Chron's disease. 7.   LE cellulitis- resolved.   Disposition: F/u in 6 months  Current medicines are reviewed at length with the patient today.  The patient did not have any concerns regarding medicines.  Signed, Amandajo Gonder [name] MD, [facility]    01/30/2018 10:22 AM     [facility]

## 2018-01-30 ENCOUNTER — Ambulatory Visit (INDEPENDENT_AMBULATORY_CARE_PROVIDER_SITE_OTHER): Payer: PPO | Admitting: Cardiology

## 2018-01-30 ENCOUNTER — Encounter: Payer: Self-pay | Admitting: Cardiology

## 2018-01-30 VITALS — BP 96/60 | HR 89 | Ht 67.0 in | Wt 166.8 lb

## 2018-01-30 DIAGNOSIS — I5032 Chronic diastolic (congestive) heart failure: Secondary | ICD-10-CM | POA: Diagnosis not present

## 2018-01-30 DIAGNOSIS — I2510 Atherosclerotic heart disease of native coronary artery without angina pectoris: Secondary | ICD-10-CM | POA: Diagnosis not present

## 2018-01-30 DIAGNOSIS — R2689 Other abnormalities of gait and mobility: Secondary | ICD-10-CM | POA: Diagnosis not present

## 2018-01-30 DIAGNOSIS — A4189 Other specified sepsis: Secondary | ICD-10-CM | POA: Diagnosis not present

## 2018-01-30 DIAGNOSIS — G20 Parkinson's disease: Secondary | ICD-10-CM | POA: Diagnosis not present

## 2018-01-30 DIAGNOSIS — R278 Other lack of coordination: Secondary | ICD-10-CM | POA: Diagnosis not present

## 2018-01-30 DIAGNOSIS — Z9181 History of falling: Secondary | ICD-10-CM | POA: Diagnosis not present

## 2018-01-30 DIAGNOSIS — N3946 Mixed incontinence: Secondary | ICD-10-CM | POA: Diagnosis not present

## 2018-01-30 DIAGNOSIS — L03115 Cellulitis of right lower limb: Secondary | ICD-10-CM | POA: Diagnosis not present

## 2018-01-30 DIAGNOSIS — I951 Orthostatic hypotension: Secondary | ICD-10-CM | POA: Diagnosis not present

## 2018-01-30 DIAGNOSIS — R4181 Age-related cognitive decline: Secondary | ICD-10-CM | POA: Diagnosis not present

## 2018-02-02 DIAGNOSIS — L03115 Cellulitis of right lower limb: Secondary | ICD-10-CM | POA: Diagnosis not present

## 2018-02-02 DIAGNOSIS — Z9181 History of falling: Secondary | ICD-10-CM | POA: Diagnosis not present

## 2018-02-02 DIAGNOSIS — G20 Parkinson's disease: Secondary | ICD-10-CM | POA: Diagnosis not present

## 2018-02-02 DIAGNOSIS — N3946 Mixed incontinence: Secondary | ICD-10-CM | POA: Diagnosis not present

## 2018-02-02 DIAGNOSIS — R2689 Other abnormalities of gait and mobility: Secondary | ICD-10-CM | POA: Diagnosis not present

## 2018-02-02 DIAGNOSIS — A4189 Other specified sepsis: Secondary | ICD-10-CM | POA: Diagnosis not present

## 2018-02-02 DIAGNOSIS — R4181 Age-related cognitive decline: Secondary | ICD-10-CM | POA: Diagnosis not present

## 2018-02-02 DIAGNOSIS — I951 Orthostatic hypotension: Secondary | ICD-10-CM | POA: Diagnosis not present

## 2018-02-02 DIAGNOSIS — R278 Other lack of coordination: Secondary | ICD-10-CM | POA: Diagnosis not present

## 2018-02-03 DIAGNOSIS — R278 Other lack of coordination: Secondary | ICD-10-CM | POA: Diagnosis not present

## 2018-02-03 DIAGNOSIS — R2689 Other abnormalities of gait and mobility: Secondary | ICD-10-CM | POA: Diagnosis not present

## 2018-02-03 DIAGNOSIS — A4189 Other specified sepsis: Secondary | ICD-10-CM | POA: Diagnosis not present

## 2018-02-03 DIAGNOSIS — L03115 Cellulitis of right lower limb: Secondary | ICD-10-CM | POA: Diagnosis not present

## 2018-02-03 DIAGNOSIS — G20 Parkinson's disease: Secondary | ICD-10-CM | POA: Diagnosis not present

## 2018-02-03 DIAGNOSIS — I951 Orthostatic hypotension: Secondary | ICD-10-CM | POA: Diagnosis not present

## 2018-02-03 DIAGNOSIS — Z9181 History of falling: Secondary | ICD-10-CM | POA: Diagnosis not present

## 2018-02-03 DIAGNOSIS — R4181 Age-related cognitive decline: Secondary | ICD-10-CM | POA: Diagnosis not present

## 2018-02-03 DIAGNOSIS — N3946 Mixed incontinence: Secondary | ICD-10-CM | POA: Diagnosis not present

## 2018-02-04 ENCOUNTER — Telehealth: Payer: Self-pay | Admitting: Gastroenterology

## 2018-02-04 DIAGNOSIS — Z9181 History of falling: Secondary | ICD-10-CM | POA: Diagnosis not present

## 2018-02-04 DIAGNOSIS — G20 Parkinson's disease: Secondary | ICD-10-CM | POA: Diagnosis not present

## 2018-02-04 DIAGNOSIS — I951 Orthostatic hypotension: Secondary | ICD-10-CM | POA: Diagnosis not present

## 2018-02-04 DIAGNOSIS — R278 Other lack of coordination: Secondary | ICD-10-CM | POA: Diagnosis not present

## 2018-02-04 DIAGNOSIS — L03115 Cellulitis of right lower limb: Secondary | ICD-10-CM | POA: Diagnosis not present

## 2018-02-04 DIAGNOSIS — A4189 Other specified sepsis: Secondary | ICD-10-CM | POA: Diagnosis not present

## 2018-02-04 DIAGNOSIS — R4181 Age-related cognitive decline: Secondary | ICD-10-CM | POA: Diagnosis not present

## 2018-02-04 DIAGNOSIS — N3946 Mixed incontinence: Secondary | ICD-10-CM | POA: Diagnosis not present

## 2018-02-04 DIAGNOSIS — R2689 Other abnormalities of gait and mobility: Secondary | ICD-10-CM | POA: Diagnosis not present

## 2018-02-04 NOTE — Telephone Encounter (Signed)
Called and spoke with [name] [name], susan had left for the day. She states pt is not there he is attending an event. States they were pleased with the appt on Monday.

## 2018-02-04 NOTE — Telephone Encounter (Signed)
Patient who is currently being treated in the [facility] rehab unit due to other issues, is having a crohns flare up per Manuela Schwartz, Therapist, sports. Manuela Schwartz states they have tried to stick to his diet but pt is miserable. Patient scheduled for ov with NP Nevin Bloodgood on 11.4.19 but nurse and patients wife would like some advice.

## 2018-02-05 ENCOUNTER — Non-Acute Institutional Stay (SKILLED_NURSING_FACILITY): Payer: PPO | Admitting: Adult Health

## 2018-02-05 ENCOUNTER — Encounter: Payer: Self-pay | Admitting: Adult Health

## 2018-02-05 DIAGNOSIS — I951 Orthostatic hypotension: Secondary | ICD-10-CM | POA: Diagnosis not present

## 2018-02-05 DIAGNOSIS — F0280 Dementia in other diseases classified elsewhere without behavioral disturbance: Secondary | ICD-10-CM | POA: Diagnosis not present

## 2018-02-05 DIAGNOSIS — N3945 Continuous leakage: Secondary | ICD-10-CM | POA: Diagnosis not present

## 2018-02-05 DIAGNOSIS — G20 Parkinson's disease: Secondary | ICD-10-CM

## 2018-02-05 DIAGNOSIS — L304 Erythema intertrigo: Secondary | ICD-10-CM

## 2018-02-05 DIAGNOSIS — K50918 Crohn's disease, unspecified, with other complication: Secondary | ICD-10-CM | POA: Diagnosis not present

## 2018-02-05 NOTE — Progress Notes (Signed)
Location:  Occupational psychologist of Service:  SNF (31) Provider:   Cindi Carbon, Branch [phone]   Shon Baton, MD  Patient Care Team: Shon Baton, MD as PCP - General (Internal Medicine) Bensimhon, Shaune Pascal, MD (Cardiology) [name], [name], MD as Consulting Physician (Cardiology) [name], MD as Attending Physician (Gastroenterology) [name], MD as Consulting Physician (Neurology) Community, [facility] ([place])  Extended Emergency Contact Information Primary Emergency Contact: [name] Address: [address] [address]          [name] Phone: [phone] Mobile Phone: [phone] Relation: Spouse  Code Status:  DNR Goals of care: Advanced Directive information Advanced Directives 12/23/2017  Does Patient Have a Medical Advance Directive? Yes  Type of Paramedic of [place];Living will;Out of facility DNR (pink MOST or yellow form)  Does patient want to make changes to medical advance directive? No - Patient declined  Copy of [place] in Chart? Yes  Pre-existing out of facility DNR order (yellow form or pink MOST form) Yellow form placed in chart (order not valid for inpatient use)     Chief Complaint  Patient presents with  . Acute Visit    rash  . Medical Management of Chronic Issues    HPI:  Pt is a 84 y.o. male seen today for a rash and medical management of chronic diseases.  He was admitted to [facility] rehab after a hospitalization in Sept of 2019 for cellulitis. His legs have healed with no additional swelling or fever.   Rash noted to the groin area with itching and redness. Has been present for 1 week and treated with nystatin powder.   Crohn's: more incontinent loose stools reported, staff made apt with GI No fever, bleeding, or abd pain  Dementia (parkinson's related?): awaiting  skilled care bed due to weakness and memory loss 10/03/17 MMSE 25/30  Orthostatic hypotension: no episodes reported, on midodrine  PD: Progressive weakness which has led to him needed more assistance with transfers  Past Medical History:  Diagnosis Date  . Cellulitis   . Chronic diastolic CHF (congestive heart failure) (HCC)    a. EF initially 35-40% after MI 1/09; b. echo 7/08: EF 60%;  c. 11/2014 Echo: EF 65-70%, Gr 1 DD, mild MR.  . Coronary artery disease    a. s/p anterior STEMI 04/2007 with BMS-> LAD;  b. Cath 12/2011 patent stent;  c. low risk nuc 04/2014.  . Crohn's disease ([place])   . Diaphragmatic hernia without mention of obstruction or gangrene   . Diverticulosis of colon (without mention of hemorrhage)   . Fatty liver    a. on ultrasound of 10/2009  . Flatulence, eructation, and gas pain   . Fungal infection   . Gait disorder   . GERD (gastroesophageal reflux disease)   . HOH (hard of hearing)    Hearing aids  . Hyperlipidemia   . Hypertension   . Ischemic cardiomyopathy    a. EF initially 35-40% after MI 1/09; b. echo 7/08: EF 60%;  c. 11/2014 Echo: EF 65-70%, Gr 1 DD, mild MR.  . Melanoma ([place])   . Memory disorder   . Orthostatic hypotension   . Osteoporosis   . Other chronic nonalcoholic liver disease   . Other esophagitis   . Parkinson's disease ([place])   . RBBB 09/02/2013  . Regional enteritis of small intestine ([place])   .  Renal calculi   . Restless leg syndrome 03/22/2015  . 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 Urinary incontinence    Past Surgical History:  Procedure Laterality Date  . APPENDECTOMY    . BACK SURGERY     L3, L4, L5  . CARDIAC CATHETERIZATION  09/25/06   EF 35-40% but more recently 60%  . CATARACT EXTRACTION     Bilateral  . CHOLECYSTECTOMY    . Coronary artery stent placement    . Melanoma resection    . Partial bowel resection    . TONSILLECTOMY      No Known Allergies  Outpatient Encounter Medications as of 02/05/2018    Medication Sig  . acetaminophen (TYLENOL) 325 MG tablet Take 650 mg by mouth every 6 (six) hours as needed for pain.  Marland Kitchen ADVAIR DISKUS 250-50 MCG/DOSE AEPB Inhale 1 puff into the lungs daily. For shortness of breath  . aspirin EC 81 MG tablet Take 81 mg by mouth daily.  Marland Kitchen atorvastatin (LIPITOR) 40 MG tablet Take 40 mg by mouth daily.  . calcium carbonate (CALCIUM 600) 600 MG TABS tablet Take 600 mg by mouth daily with breakfast.  . carbidopa-levodopa (SINEMET CR) 50-200 MG tablet Take 1 tablet by mouth 4 (four) times daily.  . carvedilol (COREG) 3.125 MG tablet take one tablet ONLY if systolic BP greater than 622  . Cholecalciferol (VITAMIN D-3) 1000 UNITS CAPS Take 2,000 Units by mouth daily.   . cholestyramine (QUESTRAN) 4 g packet TAKE 1 PACKET BY MOUTH TWICE DAILY WITH A MEAL  . donepezil (ARICEPT) 10 MG tablet TAKE ONE TABLET AT BEDTIME  . fluorouracil (EFUDEX) 5 % cream Apply 1 application topically as directed. By dermatologist  . guaiFENesin-dextromethorphan (ROBITUSSIN DM) 100-10 MG/5ML syrup Take 10 mLs by mouth every 6 (six) hours as needed for cough.  Marland Kitchen ipratropium (ATROVENT) 0.03 % nasal spray Place 2 sprays into both nostrils 3 (three) times daily.  Marland Kitchen ipratropium-albuterol (DUONEB) 0.5-2.5 (3) MG/3ML SOLN Take 3 mLs by nebulization every 6 (six) hours as needed.  . loperamide (IMODIUM A-D) 2 MG tablet Take 2 mg by mouth 2 (two) times daily as needed for diarrhea or loose stools.  . Melatonin 5 MG TABS Take by mouth.  . midodrine (PROAMATINE) 5 MG tablet Take 1 tablet (5 mg total) by mouth 2 (two) times daily with a meal.  . Multiple Vitamins-Minerals (CENTRUM SILVER PO) Take 1 tablet by mouth daily.  . mupirocin cream (BACTROBAN) 2 % Apply 1 application topically daily as needed (skin irritation).  . mupirocin ointment (BACTROBAN) 2 % Place 1 application into the nose as needed.  . nitroGLYCERIN (NITROSTAT) 0.4 MG SL tablet Place 1 tablet (0.4 mg total) under the tongue every 5  (five) minutes as needed for chest pain. PT OVERDUE FOR OV PLEASE CALL FOR APPT  . pantoprazole (PROTONIX) 40 MG tablet Take 40 mg by mouth daily.   . potassium chloride SA (K-DUR,KLOR-CON) 20 MEQ tablet Take 40 mEq by mouth daily.  . Probiotic Product (ALIGN) 4 MG CAPS Take by mouth.  . torsemide (DEMADEX) 20 MG tablet Take 1 tablet (20 mg total) by mouth daily.  . vitamin B-12 (CYANOCOBALAMIN) 1000 MCG tablet Take 1,000 mcg by mouth daily.  . [DISCONTINUED] clotrimazole (MYCELEX) 10 MG troche Take 10 mg by mouth 5 (five) times daily.  . [DISCONTINUED] doxycycline (VIBRA-TABS) 100 MG tablet Take 100 mg by mouth 2 (two) times daily.   No facility-administered encounter medications on file as of 02/05/2018.  Review of Systems  Constitutional: Negative for activity change, appetite change, chills, diaphoresis, fatigue, fever and unexpected weight change.  HENT: Negative for congestion.   Respiratory: Negative for cough, shortness of breath, wheezing and stridor.   Cardiovascular: Negative for chest pain, palpitations and leg swelling.  Gastrointestinal: Positive for diarrhea. Negative for abdominal distention, abdominal pain and constipation.  Genitourinary: Negative for difficulty urinating and dysuria.       Incontinence of urine and stool  Musculoskeletal: Positive for gait problem. Negative for arthralgias, back pain, joint swelling and myalgias.  Neurological: Positive for weakness. Negative for dizziness, seizures, syncope, facial asymmetry, speech difficulty and headaches.  Hematological: Negative for adenopathy. Does not bruise/bleed easily.  Psychiatric/Behavioral: Positive for confusion. Negative for agitation and behavioral problems.    Immunization History  Administered Date(s) Administered  . Influenza-Unspecified 12/06/2014   Pertinent  Health Maintenance Due  Topic Date Due  . PNA vac Low Risk Adult (1 of 2 - PCV13) 01/08/1996  . INFLUENZA VACCINE  11/06/2017   Fall  Risk  10/03/2017 12/29/2014  Falls in the past year? No Yes  Number falls in past yr: - 1  Injury with Fall? - No  Risk for fall due to : - Impaired balance/gait  Follow up - Falls evaluation completed   Functional Status Survey:    Vitals:   02/05/18 1624  BP: 128/68  Pulse: 94  Resp: 18  Temp: 97.6 F (36.4 C)  Weight: 164 lb 6.4 oz (74.6 kg)   Body mass index is 25.75 kg/m. Physical Exam  Constitutional: No distress.  HENT:  Head: Normocephalic and atraumatic.  Neck: No JVD present. No tracheal deviation present. No thyromegaly present.  Cardiovascular: Normal rate and regular rhythm.  No murmur heard. Pulmonary/Chest: Effort normal and breath sounds normal. No respiratory distress. He has no wheezes.  Abdominal: Soft. Bowel sounds are normal. He exhibits no distension. There is no tenderness.  Lymphadenopathy:    He has no cervical adenopathy.  Neurological: He is alert.  Oriented x 2 Strength BUE 4/5, BLE 3/5  Skin: Skin is warm and dry. Rash (raised with erythema to groin bilateral) noted. He is not diaphoretic.  Psychiatric: He has a normal mood and affect.  Nursing note and vitals reviewed.   Labs reviewed: Recent Labs    12/18/17 0533 12/19/17 0518 12/20/17 0522  NA 138 137 139  K 3.3* 3.4* 3.5  CL 105 100 99  CO2 21* 22 28  GLUCOSE 113* 116* 117*  BUN 15 18 21   CREATININE 0.97 1.03 1.06  CALCIUM 8.0* 8.3* 8.7*  MG  --  1.1* 2.0  PHOS  --   --  3.2   Recent Labs    09/02/17 1705 12/17/17 2044 12/18/17 0533  AST 27 28 21   ALT 14* 7 13  ALKPHOS 70 83 65  BILITOT 1.8* 2.3* 1.8*  PROT 7.8 8.6* 6.7  ALBUMIN 4.1 4.3 3.3*   Recent Labs    09/02/17 1705 12/17/17 2044 12/18/17 0533 12/19/17 0518 12/20/17 0522  WBC 8.8 18.5* 16.9* 12.0* 9.9  NEUTROABS 5.3 16.7*  --   --   --   HGB 12.4* 13.1 11.3* 12.1* 12.3*  HCT 36.8* 39.7 34.0* 36.5* 37.6*  MCV 93.6 94.7 93.9 95.3 94.7  PLT 220 232 197 201 226   Lab Results  Component Value Date    TSH 2.428 11/12/2014   No results found for: HGBA1C No results found for: CHOL, HDL, LDLCALC, LDLDIRECT, TRIG, CHOLHDL  Significant Diagnostic  Results in last 30 days:  No results found.  Assessment/Plan  1. Parkinson disease ([place]) Followed by neurology Has progressive weakness Continue sinemet   2. Crohn's disease with other complication, unspecified gastrointestinal tract location [facility]) F/U with GI due to worsening loose stools Try to avoid food triggers  3. Orthostatic hypotension Controlled with midodrine 68m BID  4. Dementia due to Parkinson's disease without behavioral disturbance ([place] Awaiting skilled bed Continue Aricept 10  Mg qhs  5. Intertrigo Diflucan 150 mg x 1 dose Lotrisone cream BID for 10 days  6. Incontinence May use condom cath each evening for urinary incontinence OT recommendations for incontinence regimen   Family/ staff Communication: staff/resident  Labs/tests ordered:  NA

## 2018-02-06 DIAGNOSIS — L03115 Cellulitis of right lower limb: Secondary | ICD-10-CM | POA: Diagnosis not present

## 2018-02-06 DIAGNOSIS — R278 Other lack of coordination: Secondary | ICD-10-CM | POA: Diagnosis not present

## 2018-02-06 DIAGNOSIS — A4189 Other specified sepsis: Secondary | ICD-10-CM | POA: Diagnosis not present

## 2018-02-06 DIAGNOSIS — Z9181 History of falling: Secondary | ICD-10-CM | POA: Diagnosis not present

## 2018-02-06 DIAGNOSIS — R2689 Other abnormalities of gait and mobility: Secondary | ICD-10-CM | POA: Diagnosis not present

## 2018-02-06 DIAGNOSIS — N3946 Mixed incontinence: Secondary | ICD-10-CM | POA: Diagnosis not present

## 2018-02-06 DIAGNOSIS — I951 Orthostatic hypotension: Secondary | ICD-10-CM | POA: Diagnosis not present

## 2018-02-06 DIAGNOSIS — G20 Parkinson's disease: Secondary | ICD-10-CM | POA: Diagnosis not present

## 2018-02-06 DIAGNOSIS — R4181 Age-related cognitive decline: Secondary | ICD-10-CM | POA: Diagnosis not present

## 2018-02-09 ENCOUNTER — Encounter: Payer: Self-pay | Admitting: Nurse Practitioner

## 2018-02-09 ENCOUNTER — Ambulatory Visit (INDEPENDENT_AMBULATORY_CARE_PROVIDER_SITE_OTHER): Payer: PPO | Admitting: Nurse Practitioner

## 2018-02-09 ENCOUNTER — Other Ambulatory Visit (INDEPENDENT_AMBULATORY_CARE_PROVIDER_SITE_OTHER): Payer: PPO

## 2018-02-09 VITALS — BP 102/74 | HR 87 | Resp 16 | Ht 69.0 in | Wt 160.0 lb

## 2018-02-09 DIAGNOSIS — I951 Orthostatic hypotension: Secondary | ICD-10-CM | POA: Diagnosis not present

## 2018-02-09 DIAGNOSIS — A4189 Other specified sepsis: Secondary | ICD-10-CM | POA: Diagnosis not present

## 2018-02-09 DIAGNOSIS — Z9181 History of falling: Secondary | ICD-10-CM | POA: Diagnosis not present

## 2018-02-09 DIAGNOSIS — N3946 Mixed incontinence: Secondary | ICD-10-CM | POA: Diagnosis not present

## 2018-02-09 DIAGNOSIS — Z8719 Personal history of other diseases of the digestive system: Secondary | ICD-10-CM

## 2018-02-09 DIAGNOSIS — R4181 Age-related cognitive decline: Secondary | ICD-10-CM | POA: Diagnosis not present

## 2018-02-09 DIAGNOSIS — R2689 Other abnormalities of gait and mobility: Secondary | ICD-10-CM | POA: Diagnosis not present

## 2018-02-09 DIAGNOSIS — R197 Diarrhea, unspecified: Secondary | ICD-10-CM | POA: Diagnosis not present

## 2018-02-09 DIAGNOSIS — R278 Other lack of coordination: Secondary | ICD-10-CM | POA: Diagnosis not present

## 2018-02-09 DIAGNOSIS — L03115 Cellulitis of right lower limb: Secondary | ICD-10-CM | POA: Diagnosis not present

## 2018-02-09 DIAGNOSIS — G20 Parkinson's disease: Secondary | ICD-10-CM | POA: Diagnosis not present

## 2018-02-09 LAB — CBC
HCT: 41.8 % (ref 39.0–52.0)
Hemoglobin: 14.1 g/dL (ref 13.0–17.0)
MCHC: 33.8 g/dL (ref 30.0–36.0)
MCV: 92.7 fl (ref 78.0–100.0)
Platelets: 304 10*3/uL (ref 150.0–400.0)
RBC: 4.51 Mil/uL (ref 4.22–5.81)
RDW: 14.1 % (ref 11.5–15.5)
WBC: 9.6 10*3/uL (ref 4.0–10.5)

## 2018-02-09 LAB — HIGH SENSITIVITY CRP: CRP, High Sensitivity: 0.59 mg/L (ref 0.000–5.000)

## 2018-02-09 LAB — BASIC METABOLIC PANEL
BUN: 26 mg/dL — ABNORMAL HIGH (ref 6–23)
BUN: 26 — AB (ref 4–21)
CO2: 31 mEq/L (ref 19–32)
Calcium: 8.6 mg/dL (ref 8.4–10.5)
Chloride: 97 mEq/L (ref 96–112)
Creatinine, Ser: 1.26 mg/dL (ref 0.40–1.50)
Creatinine: 1.3 (ref 0.6–1.3)
GFR: 57.53 mL/min — ABNORMAL LOW (ref >60.00)
Glucose, Bld: 127 mg/dL — ABNORMAL HIGH (ref 70–99)
Glucose: 127
Potassium: 3.6 (ref 3.4–5.3)
Potassium: 3.6 mEq/L (ref 3.5–5.1)
Sodium: 139 (ref 137–147)
Sodium: 139 mEq/L (ref 135–145)

## 2018-02-09 LAB — SEDIMENTATION RATE: Sed Rate: 49 mm/hr — ABNORMAL HIGH (ref 0–20)

## 2018-02-09 LAB — CBC AND DIFFERENTIAL: WBC: 9.6

## 2018-02-09 NOTE — Patient Instructions (Signed)
If you are age 82 or older, your body mass index should be between 23-30. Your Body mass index is 23.63 kg/m. If this is out of the aforementioned range listed, please consider follow up with your Primary Care Provider.  If you are age 82 or younger, your body mass index should be between 19-25. Your Body mass index is 23.63 kg/m. If this is out of the aformentioned range listed, please consider follow up with your Primary Care Provider.    Your provider has requested that you go to the basement level for lab work before leaving today. Press "B" on the elevator. The lab is located at the first door on the left as you exit the elevator.  [place] to collect - Stool for C Diff.  Fax results to: [phone]. Attn: Beth.  Wife to see if any new medications started since being in [place].  Thank you for choosing me and [facility].   Tye Savoy, NP

## 2018-02-09 NOTE — Progress Notes (Signed)
Chief Complaint:  diarrhea     IMPRESSION and PLAN:    84 yo male with his of Crohn's disease, s/p distant ileal and right colon resection. Off all Crohn's meds since 2016 (Remicade stopped due to fungal infection. Had been doing well since until a couple of weeks ago. Now with non-bloody diarrhea.  -He has no abdominal pain, blood in stool, unexpected weight loss or other concerning features. While active Crohn's certainly on list of DDx. I think we also need to rule out C-diff in setting of recent antibiotics and also recent residence in a facility.  -Other considerations:   Any new meds while in acute care facility?  Wife to check on this. Also, he has started to use artificial sweeteners in drinks.  -CBC, BMET, ESR, CRP, stool for C-diff.  -Will call with results and further recommendations. For now continue BID Questran.   HPI:     Patient is an 84 yo male known to Dr. Ardis Hughs for hx of Crohn's disease, s/p distant ileal and right colon resection. Treated for several years with oral agents including mesalamine, Budesonide and immunomodulators. Colonoscopy in 2014 showed anastomotic ulceration, narrowing of ileocecal anastomosis. Biopsies c/w chronic and active inflammation. He was started on Remicade with a good response but it was stopped after patient developed fungal cellulitis.  He was last seen mid Dec 2018 and was doing well off all Crohn's meds over preceding couple of years. . He was maintaining on BID Questran .     Mr Bead had been doing fairly well from a GI standpoint until a couple of weeks ago. He typically has a few soft, often unformed stools a day. Takes Imodium as needed but not very often. For the last two weeks patient has been having several loose, non-bloody stools a day. He has had some associated incontinence. No fevers. No abdominal pain. No reported weight loss. Mr. Deery lives at [facility] . For the last few weeks he has been staying in the acute care area  of the facility. He has been receiving treatment for lower extremity cellulitis. He has had antibiotics in the recent past. Additionally he has been using artificial sweeteners in drinks. Wife accompanies patient. She isn't sure if any of his medications have been changed in the last few weeks.    Review of systems:     No chest pain, no SOB, no fevers, no urinary sx   Past Medical History:  Diagnosis Date  . Cellulitis   . Chronic diastolic CHF (congestive heart failure) (HCC)    a. EF initially 35-40% after MI 1/09; b. echo 7/08: EF 60%;  c. 11/2014 Echo: EF 65-70%, Gr 1 DD, mild MR.  . Coronary artery disease    a. s/p anterior STEMI 04/2007 with BMS-> LAD;  b. Cath 12/2011 patent stent;  c. low risk nuc 04/2014.  . Crohn's disease ([place])   . Diaphragmatic hernia without mention of obstruction or gangrene   . Diverticulosis of colon (without mention of hemorrhage)   . Fatty liver    a. on ultrasound of 10/2009  . Flatulence, eructation, and gas pain   . Fungal infection   . Gait disorder   . GERD (gastroesophageal reflux disease)   . HOH (hard of hearing)    Hearing aids  . Hyperlipidemia   . Hypertension   . Ischemic cardiomyopathy    a. EF initially 35-40% after MI 1/09; b. echo 7/08: EF 60%;  c. 11/2014 Echo:  EF 65-70%, Gr 1 DD, mild MR.  . Melanoma ([place])   . Memory disorder   . Orthostatic hypotension   . Osteoporosis   . Other chronic nonalcoholic liver disease   . Other esophagitis   . Parkinson's disease ([place])   . RBBB 09/02/2013  . Regional enteritis of small intestine ([place])   . Renal calculi   . Restless leg syndrome 03/22/2015  . 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 Urinary incontinence     Patient's surgical history, family medical history, social history, medications and allergies were all reviewed in Epic   Creatinine clearance cannot be calculated (Patient's most recent lab result is older than the maximum 21 days allowed.)  Current Outpatient Medications    Medication Sig Dispense Refill  . acetaminophen (TYLENOL) 325 MG tablet Take 650 mg by mouth every 6 (six) hours as needed for pain.    Marland Kitchen ADVAIR DISKUS 250-50 MCG/DOSE AEPB Inhale 1 puff into the lungs daily. For shortness of breath    . aspirin EC 81 MG tablet Take 81 mg by mouth daily.    Marland Kitchen atorvastatin (LIPITOR) 40 MG tablet Take 40 mg by mouth daily.    . calcium carbonate (CALCIUM 600) 600 MG TABS tablet Take 600 mg by mouth daily with breakfast.    . carbidopa-levodopa (SINEMET CR) 50-200 MG tablet Take 1 tablet by mouth 4 (four) times daily. 120 tablet 5  . carvedilol (COREG) 3.125 MG tablet take one tablet ONLY if systolic BP greater than 161    . Cholecalciferol (VITAMIN D-3) 1000 UNITS CAPS Take 2,000 Units by mouth daily.     . cholestyramine (QUESTRAN) 4 g packet TAKE 1 PACKET BY MOUTH TWICE DAILY WITH A MEAL 60 each 11  . donepezil (ARICEPT) 10 MG tablet TAKE ONE TABLET AT BEDTIME 90 tablet 0  . fluorouracil (EFUDEX) 5 % cream Apply 1 application topically as directed. By dermatologist    . guaiFENesin-dextromethorphan (ROBITUSSIN DM) 100-10 MG/5ML syrup Take 10 mLs by mouth every 6 (six) hours as needed for cough.    Marland Kitchen ipratropium (ATROVENT) 0.03 % nasal spray Place 2 sprays into both nostrils 3 (three) times daily.    Marland Kitchen ipratropium-albuterol (DUONEB) 0.5-2.5 (3) MG/3ML SOLN Take 3 mLs by nebulization every 6 (six) hours as needed. 360 mL 0  . loperamide (IMODIUM A-D) 2 MG tablet Take 2 mg by mouth 2 (two) times daily as needed for diarrhea or loose stools.    . Melatonin 5 MG TABS Take 1 tablet by mouth at bedtime.     . midodrine (PROAMATINE) 5 MG tablet Take 1 tablet (5 mg total) by mouth 2 (two) times daily with a meal. 60 tablet 9  . Multiple Vitamins-Minerals (CENTRUM SILVER PO) Take 1 tablet by mouth daily.    . mupirocin cream (BACTROBAN) 2 % Apply 1 application topically daily as needed (skin irritation).    . mupirocin ointment (BACTROBAN) 2 % Place 1 application into  the nose as needed.    . nitroGLYCERIN (NITROSTAT) 0.4 MG SL tablet Place 1 tablet (0.4 mg total) under the tongue every 5 (five) minutes as needed for chest pain. PT OVERDUE FOR OV PLEASE CALL FOR APPT 25 tablet 0  . pantoprazole (PROTONIX) 40 MG tablet Take 40 mg by mouth daily.     . potassium chloride SA (K-DUR,KLOR-CON) 20 MEQ tablet Take 40 mEq by mouth daily.    . Probiotic Product (ALIGN) 4 MG CAPS Take 1 capsule by mouth daily.     Marland Kitchen  torsemide (DEMADEX) 20 MG tablet Take 1 tablet (20 mg total) by mouth daily. (Patient taking differently: Take 20 mg by mouth daily. Hold if SBP<105) 180 tablet 3  . vitamin B-12 (CYANOCOBALAMIN) 1000 MCG tablet Take 1,000 mcg by mouth daily.     No current facility-administered medications for this visit.     Physical Exam:     BP 102/74   Pulse 87   Resp 16   Ht _0  (1.753 m)   Wt 150 lb 3.2 oz (68.1 kg)   SpO2 95%   BMI 22.18 kg/m   GENERAL:  Pleasant male in NAD PSYCH: : Cooperative, normal affect EENT:  conjunctiva pink, mucous membranes moist, neck supple without masses CARDIAC:  RRR, no peripheral edema PULM: Normal respiratory effort ABDOMEN:  Nondistended, soft, nontender. Normal bowel sounds SKIN:  turgor, no lesions seen Musculoskeletal:  Normal muscle tone, normal strength NEURO: Alert and oriented x 3, no focal neurologic deficits   Tye Savoy , NP 02/09/2018, 12:05 PM

## 2018-02-11 DIAGNOSIS — A4189 Other specified sepsis: Secondary | ICD-10-CM | POA: Diagnosis not present

## 2018-02-11 DIAGNOSIS — N3946 Mixed incontinence: Secondary | ICD-10-CM | POA: Diagnosis not present

## 2018-02-11 DIAGNOSIS — I951 Orthostatic hypotension: Secondary | ICD-10-CM | POA: Diagnosis not present

## 2018-02-11 DIAGNOSIS — L03115 Cellulitis of right lower limb: Secondary | ICD-10-CM | POA: Diagnosis not present

## 2018-02-11 DIAGNOSIS — R2689 Other abnormalities of gait and mobility: Secondary | ICD-10-CM | POA: Diagnosis not present

## 2018-02-11 DIAGNOSIS — G20 Parkinson's disease: Secondary | ICD-10-CM | POA: Diagnosis not present

## 2018-02-11 DIAGNOSIS — Z9181 History of falling: Secondary | ICD-10-CM | POA: Diagnosis not present

## 2018-02-11 DIAGNOSIS — R4181 Age-related cognitive decline: Secondary | ICD-10-CM | POA: Diagnosis not present

## 2018-02-11 DIAGNOSIS — R278 Other lack of coordination: Secondary | ICD-10-CM | POA: Diagnosis not present

## 2018-02-12 ENCOUNTER — Encounter: Payer: Self-pay | Admitting: Nurse Practitioner

## 2018-02-12 NOTE — Progress Notes (Signed)
I agree with the above note, plan 

## 2018-02-13 DIAGNOSIS — L03115 Cellulitis of right lower limb: Secondary | ICD-10-CM | POA: Diagnosis not present

## 2018-02-13 DIAGNOSIS — R4181 Age-related cognitive decline: Secondary | ICD-10-CM | POA: Diagnosis not present

## 2018-02-13 DIAGNOSIS — I951 Orthostatic hypotension: Secondary | ICD-10-CM | POA: Diagnosis not present

## 2018-02-13 DIAGNOSIS — N3946 Mixed incontinence: Secondary | ICD-10-CM | POA: Diagnosis not present

## 2018-02-13 DIAGNOSIS — R2689 Other abnormalities of gait and mobility: Secondary | ICD-10-CM | POA: Diagnosis not present

## 2018-02-13 DIAGNOSIS — G20 Parkinson's disease: Secondary | ICD-10-CM | POA: Diagnosis not present

## 2018-02-13 DIAGNOSIS — Z9181 History of falling: Secondary | ICD-10-CM | POA: Diagnosis not present

## 2018-02-13 DIAGNOSIS — A4189 Other specified sepsis: Secondary | ICD-10-CM | POA: Diagnosis not present

## 2018-02-13 DIAGNOSIS — R278 Other lack of coordination: Secondary | ICD-10-CM | POA: Diagnosis not present

## 2018-02-16 DIAGNOSIS — R2689 Other abnormalities of gait and mobility: Secondary | ICD-10-CM | POA: Diagnosis not present

## 2018-02-16 DIAGNOSIS — I951 Orthostatic hypotension: Secondary | ICD-10-CM | POA: Diagnosis not present

## 2018-02-16 DIAGNOSIS — G20 Parkinson's disease: Secondary | ICD-10-CM | POA: Diagnosis not present

## 2018-02-16 DIAGNOSIS — N3946 Mixed incontinence: Secondary | ICD-10-CM | POA: Diagnosis not present

## 2018-02-16 DIAGNOSIS — L03115 Cellulitis of right lower limb: Secondary | ICD-10-CM | POA: Diagnosis not present

## 2018-02-16 DIAGNOSIS — R4181 Age-related cognitive decline: Secondary | ICD-10-CM | POA: Diagnosis not present

## 2018-02-16 DIAGNOSIS — R278 Other lack of coordination: Secondary | ICD-10-CM | POA: Diagnosis not present

## 2018-02-16 DIAGNOSIS — Z9181 History of falling: Secondary | ICD-10-CM | POA: Diagnosis not present

## 2018-02-16 DIAGNOSIS — A4189 Other specified sepsis: Secondary | ICD-10-CM | POA: Diagnosis not present

## 2018-02-17 DIAGNOSIS — R4181 Age-related cognitive decline: Secondary | ICD-10-CM | POA: Diagnosis not present

## 2018-02-17 DIAGNOSIS — R2689 Other abnormalities of gait and mobility: Secondary | ICD-10-CM | POA: Diagnosis not present

## 2018-02-17 DIAGNOSIS — I951 Orthostatic hypotension: Secondary | ICD-10-CM | POA: Diagnosis not present

## 2018-02-17 DIAGNOSIS — Z9181 History of falling: Secondary | ICD-10-CM | POA: Diagnosis not present

## 2018-02-17 DIAGNOSIS — L03115 Cellulitis of right lower limb: Secondary | ICD-10-CM | POA: Diagnosis not present

## 2018-02-17 DIAGNOSIS — G20 Parkinson's disease: Secondary | ICD-10-CM | POA: Diagnosis not present

## 2018-02-17 DIAGNOSIS — N3946 Mixed incontinence: Secondary | ICD-10-CM | POA: Diagnosis not present

## 2018-02-17 DIAGNOSIS — A4189 Other specified sepsis: Secondary | ICD-10-CM | POA: Diagnosis not present

## 2018-02-17 DIAGNOSIS — R278 Other lack of coordination: Secondary | ICD-10-CM | POA: Diagnosis not present

## 2018-02-19 DIAGNOSIS — L03115 Cellulitis of right lower limb: Secondary | ICD-10-CM | POA: Diagnosis not present

## 2018-02-19 DIAGNOSIS — N3946 Mixed incontinence: Secondary | ICD-10-CM | POA: Diagnosis not present

## 2018-02-19 DIAGNOSIS — Z9181 History of falling: Secondary | ICD-10-CM | POA: Diagnosis not present

## 2018-02-19 DIAGNOSIS — R4181 Age-related cognitive decline: Secondary | ICD-10-CM | POA: Diagnosis not present

## 2018-02-19 DIAGNOSIS — R2689 Other abnormalities of gait and mobility: Secondary | ICD-10-CM | POA: Diagnosis not present

## 2018-02-19 DIAGNOSIS — I951 Orthostatic hypotension: Secondary | ICD-10-CM | POA: Diagnosis not present

## 2018-02-19 DIAGNOSIS — A4189 Other specified sepsis: Secondary | ICD-10-CM | POA: Diagnosis not present

## 2018-02-19 DIAGNOSIS — G20 Parkinson's disease: Secondary | ICD-10-CM | POA: Diagnosis not present

## 2018-02-19 DIAGNOSIS — R278 Other lack of coordination: Secondary | ICD-10-CM | POA: Diagnosis not present

## 2018-02-20 ENCOUNTER — Encounter: Payer: Self-pay | Admitting: Neurology

## 2018-02-20 ENCOUNTER — Ambulatory Visit (INDEPENDENT_AMBULATORY_CARE_PROVIDER_SITE_OTHER): Payer: PPO | Admitting: Neurology

## 2018-02-20 ENCOUNTER — Encounter

## 2018-02-20 VITALS — BP 127/66 | HR 74

## 2018-02-20 DIAGNOSIS — G20 Parkinson's disease: Secondary | ICD-10-CM

## 2018-02-20 DIAGNOSIS — R269 Unspecified abnormalities of gait and mobility: Secondary | ICD-10-CM | POA: Diagnosis not present

## 2018-02-20 DIAGNOSIS — R413 Other amnesia: Secondary | ICD-10-CM

## 2018-02-20 MED ORDER — CARBIDOPA-LEVODOPA 25-100 MG PO TABS: ORAL_TABLET | ORAL | 1 refills | Status: DC

## 2018-02-20 NOTE — Patient Instructions (Signed)
We will add Sinemet IR 25/100 taking 1/2 tablet with the Sinemet CR, going up gradually on the dose.   Sinemet (carbidopa) may result in confusion or hallucinations, drowsiness, nausea, or dizziness. If any significant side effects are noted, please contact our office. Sinemet may not be well absorbed when taken with high protein meals, if tolerated it is best to take 30-45 minutes before you eat.

## 2018-02-20 NOTE — Progress Notes (Signed)
Reason for visit: Parkinson's disease  [name] [name] is an 84 y.o. male  History of present illness:  [name] is an 84 year old right-handed white male with a history of Parkinson's disease and a mild memory disorder.  The patient is in [place] in skilled care, he was in the hospital on 17 December 2017 with cellulitis of the right leg.  The patient is getting physical therapy twice a week currently.  He is walking with a walker.  He has not had any recent falls.  He is sleeping well at night, he denies any hallucinations or vivid dreams.  He continues on Aricept for his memory.  He is on midodrine for orthostatic hypotension, he has not had any significant blood pressure drops recently.  He remains on Sinemet taking the 50/200 mg tablets, 1 tablet 4 times daily.  He has difficulty getting up out of a chair, he requires some assistance.  He is shuffling his feet more with walking.  He returns to this office for an evaluation.  Past Medical History:  Diagnosis Date  . Cellulitis   . Chronic diastolic CHF (congestive heart failure) (HCC)    a. EF initially 35-40% after MI 1/09; b. echo 7/08: EF 60%;  c. 11/2014 Echo: EF 65-70%, Gr 1 DD, mild MR.  . Coronary artery disease    a. s/p anterior STEMI 04/2007 with BMS-> LAD;  b. Cath 12/2011 patent stent;  c. low risk nuc 04/2014.  . Crohn's disease ([place])   . Diaphragmatic hernia without mention of obstruction or gangrene   . Diverticulosis of colon (without mention of hemorrhage)   . Fatty liver    a. on ultrasound of 10/2009  . Flatulence, eructation, and gas pain   . Fungal infection   . Gait disorder   . GERD (gastroesophageal reflux disease)   . HOH (hard of hearing)    Hearing aids  . Hyperlipidemia   . Hypertension   . Ischemic cardiomyopathy    a. EF initially 35-40% after MI 1/09; b. echo 7/08: EF 60%;  c. 11/2014 Echo: EF 65-70%, Gr 1 DD, mild MR.  . Melanoma ([place])   . Memory disorder   . Orthostatic hypotension   .  Osteoporosis   . Other chronic nonalcoholic liver disease   . Other esophagitis   . Parkinson's disease ([place])   . RBBB 09/02/2013  . Regional enteritis of small intestine ([place])   . Renal calculi   . Restless leg syndrome 03/22/2015  . 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 Urinary incontinence     Past Surgical History:  Procedure Laterality Date  . APPENDECTOMY    . BACK SURGERY     L3, L4, L5  . CARDIAC CATHETERIZATION  09/25/06   EF 35-40% but more recently 60%  . CATARACT EXTRACTION     Bilateral  . CHOLECYSTECTOMY    . Coronary artery stent placement    . Melanoma resection    . Partial bowel resection    . TONSILLECTOMY      Family History  Problem Relation Age of Onset  . Heart failure Mother   . Heart attack Mother   . Hypertension Mother   . Emphysema Father   . Heart disease Brother   . Heart disease Brother   . Coronary artery disease Unknown   . Colon cancer Brother        older at dx  . Stomach cancer Neg Hx     Social history:  reports that he has never smoked. He has never used smokeless tobacco. He reports that he drinks about 2.0 - 3.0 standard drinks of alcohol per week. He reports that he does not use drugs.   No Known Allergies  Medications:  Prior to Admission medications   Medication Sig Start Date End Date Taking? Authorizing Provider  acetaminophen (TYLENOL) 325 MG tablet Take 650 mg by mouth every 6 (six) hours as needed for pain.   Yes [provider]  ADVAIR DISKUS 250-50 MCG/DOSE AEPB Inhale 1 puff into the lungs daily. For shortness of breath 01/24/14  Yes [provider]  aspirin EC 81 MG tablet Take 81 mg by mouth daily.   Yes [provider]  atorvastatin (LIPITOR) 40 MG tablet Take 40 mg by mouth daily.   Yes [provider]  calcium carbonate (CALCIUM 600) 600 MG TABS tablet Take 600 mg by mouth daily with breakfast.   Yes [provider]  carbidopa-levodopa (SINEMET CR) 50-200 MG  tablet Take 1 tablet by mouth 4 (four) times daily. 10/03/17  Yes [name], MD  carvedilol (COREG) 3.125 MG tablet take one tablet ONLY if systolic BP greater than 211   Yes [provider]  Cholecalciferol (VITAMIN D-3) 1000 UNITS CAPS Take 2,000 Units by mouth daily.    Yes [provider]  cholestyramine (QUESTRAN) 4 g packet TAKE 1 PACKET BY MOUTH TWICE DAILY WITH A MEAL 01/16/17  Yes [name], MD  donepezil (ARICEPT) 10 MG tablet TAKE ONE TABLET AT BEDTIME 12/10/17  Yes [name], MD  ipratropium (ATROVENT) 0.03 % nasal spray Place 2 sprays into both nostrils 3 (three) times daily.   Yes [provider]  ipratropium-albuterol (DUONEB) 0.5-2.5 (3) MG/3ML SOLN Take 3 mLs by nebulization every 6 (six) hours as needed. 12/20/17  Yes Cristal Deer, MD  loperamide (IMODIUM A-D) 2 MG tablet Take 2 mg by mouth 2 (two) times daily as needed for diarrhea or loose stools.   Yes [provider]  Melatonin 5 MG TABS Take 1 tablet by mouth at bedtime.    Yes [provider]  midodrine (PROAMATINE) 5 MG tablet Take 1 tablet (5 mg total) by mouth 2 (two) times daily with a meal. 11/23/14  Yes Dunn, Dayna N, PA-C  Multiple Vitamins-Minerals (CENTRUM SILVER PO) Take 1 tablet by mouth daily.   Yes [provider]  nitroGLYCERIN (NITROSTAT) 0.4 MG SL tablet Place 1 tablet (0.4 mg total) under the tongue every 5 (five) minutes as needed for chest pain. PT OVERDUE FOR OV PLEASE CALL FOR APPT 07/07/17  Yes [name], [name], MD  pantoprazole (PROTONIX) 40 MG tablet Take 40 mg by mouth daily.  12/30/14  Yes [provider]  potassium chloride SA (K-DUR,KLOR-CON) 20 MEQ tablet Take 40 mEq by mouth daily.   Yes [provider]  Probiotic Product (ALIGN) 4 MG CAPS Take 1 capsule by mouth daily.    Yes [provider]  torsemide (DEMADEX) 20 MG tablet Take 1 tablet (20 mg total) by mouth daily. Patient taking differently:  Take 20 mg by mouth daily. Hold if SBP<105 11/23/14  Yes Dunn, Dayna N, PA-C  vitamin B-12 (CYANOCOBALAMIN) 1000 MCG tablet Take 1,000 mcg by mouth daily.   Yes [provider]    ROS:  Out of a complete 14 system review of symptoms, the patient complains only of the following symptoms, and all other reviewed systems are negative.  Hearing loss, runny nose  Eye discharge Cough, wheezing, shortness of breath Diarrhea Daytime sleepiness Incontinence of the bladder Joint pain, back pain, muscle cramps, walking difficulty, neck stiffness Bruising easily Memory loss, numbness, weakness  Blood pressure 127/66, pulse 74.  Physical Exam  General: The patient is alert and cooperative at the time of the examination.  Skin: No significant peripheral edema is noted.   Neurologic Exam  Mental status: The patient is alert and oriented x 3 at the time of the examination. The Mini-Mental status examination done today shows a total score of 27/30.  Cranial nerves: Facial symmetry is present. Speech is normal, no aphasia or dysarthria is noted. Extraocular movements are full. Visual fields are full.  Motor: The patient has good strength in all 4 extremities.  Sensory examination: Soft touch sensation is symmetric on the face, arms, and legs.  Coordination: The patient has good finger-nose-finger and heel-to-shin bilaterally.  Gait and station: The patient requires some assistance with standing, once up, he can walk with assistance, his shuffling steps, difficulty with turns.  Romberg is unsteady, but he does not fall.  Reflexes: Deep tendon reflexes are symmetric.   Assessment/Plan:  1.  Parkinson's disease  2.  Memory disorder  3.  Gait disorder  The patient will continue his physical therapy.  We will go up on the Sinemet taking the 25/100 mg tablets taking 1/2 tablet with the first and third doses of the Sinemet CR 50/200 mg.  He will do this for 4 weeks and then go to one  half of the Sinemet 25/100 tablets taking this with each dose of the Sinemet CR.  He will follow-up in 5 months.  Jill Alexanders MD 02/20/2018 11:29 AM  [facility] [address] [place], [place] [address]  Phone [phone] Fax [phone]

## 2018-02-23 DIAGNOSIS — N3946 Mixed incontinence: Secondary | ICD-10-CM | POA: Diagnosis not present

## 2018-02-23 DIAGNOSIS — I951 Orthostatic hypotension: Secondary | ICD-10-CM | POA: Diagnosis not present

## 2018-02-23 DIAGNOSIS — R2689 Other abnormalities of gait and mobility: Secondary | ICD-10-CM | POA: Diagnosis not present

## 2018-02-23 DIAGNOSIS — A4189 Other specified sepsis: Secondary | ICD-10-CM | POA: Diagnosis not present

## 2018-02-23 DIAGNOSIS — Z9181 History of falling: Secondary | ICD-10-CM | POA: Diagnosis not present

## 2018-02-23 DIAGNOSIS — L03115 Cellulitis of right lower limb: Secondary | ICD-10-CM | POA: Diagnosis not present

## 2018-02-23 DIAGNOSIS — R278 Other lack of coordination: Secondary | ICD-10-CM | POA: Diagnosis not present

## 2018-02-23 DIAGNOSIS — R4181 Age-related cognitive decline: Secondary | ICD-10-CM | POA: Diagnosis not present

## 2018-02-23 DIAGNOSIS — G20 Parkinson's disease: Secondary | ICD-10-CM | POA: Diagnosis not present

## 2018-02-24 ENCOUNTER — Encounter: Payer: Self-pay | Admitting: Internal Medicine

## 2018-02-24 ENCOUNTER — Non-Acute Institutional Stay (SKILLED_NURSING_FACILITY): Payer: PPO | Admitting: Internal Medicine

## 2018-02-24 DIAGNOSIS — I5032 Chronic diastolic (congestive) heart failure: Secondary | ICD-10-CM

## 2018-02-24 DIAGNOSIS — N3945 Continuous leakage: Secondary | ICD-10-CM

## 2018-02-24 DIAGNOSIS — S161XXA Strain of muscle, fascia and tendon at neck level, initial encounter: Secondary | ICD-10-CM

## 2018-02-24 DIAGNOSIS — I2510 Atherosclerotic heart disease of native coronary artery without angina pectoris: Secondary | ICD-10-CM

## 2018-02-24 DIAGNOSIS — K5229 Other allergic and dietetic gastroenteritis and colitis: Secondary | ICD-10-CM

## 2018-02-24 DIAGNOSIS — G20A1 Parkinson's disease without dyskinesia, without mention of fluctuations: Secondary | ICD-10-CM

## 2018-02-24 DIAGNOSIS — F0280 Dementia in other diseases classified elsewhere without behavioral disturbance: Secondary | ICD-10-CM | POA: Diagnosis not present

## 2018-02-24 DIAGNOSIS — K50918 Crohn's disease, unspecified, with other complication: Secondary | ICD-10-CM | POA: Diagnosis not present

## 2018-02-24 DIAGNOSIS — G20 Parkinson's disease: Secondary | ICD-10-CM

## 2018-02-24 NOTE — Progress Notes (Signed)
[name], male   DOB: 1928/04/24, 84 y.o.   MRN: [id number]  Location:  [facility] Room Number: 962 Place of Service:  SNF [phone]) Provider:   Gayland Curry, DO  Patient Care Team: Gayland Curry, DO as PCP - General (Geriatric Medicine) Bensimhon, Shaune Pascal, MD (Cardiology) [name], [name], MD as Consulting Physician (Cardiology) [name], MD as Attending Physician (Gastroenterology) [name], MD as Consulting Physician (Neurology) Community, [facility] ([place]) Royal Hawthorn, NP as Nurse Practitioner (Nurse Practitioner)  Extended Emergency Contact Information Primary Emergency Contact: [name] Address: [address]          [name] Phone: [phone] Mobile Phone: [phone] Relation: Spouse  Code Status:  DNR Goals of care: Advanced Directive information Advanced Directives 02/24/2018  Does Patient Have a Medical Advance Directive? Yes  Type of Paramedic of [place];Living will;Out of facility DNR (pink MOST or yellow form)  Does patient want to make changes to medical advance directive? No - Patient declined  Copy of [place] in Chart? Yes - validated most recent copy scanned in chart (See row information)  Pre-existing out of facility DNR order (yellow form or pink MOST form) Yellow form placed in chart (order not valid for inpatient use)     Chief Complaint  Patient presents with  . Medical Management of Chronic Issues    transfer to SNF from rehab    HPI:  Pt is a 84 y.o. male seen today for transfer from rehab to permanent SNF bed at [facility].  [name] has Parkinson's disease, memory loss, Crohn's disease, NASH, chronic diastolic heart failure secondary to CAD/ ischemic cardiomyopathy with angina, orthostatic hypotension among others. He has most recently been in rehab for  sepsis secondary to cellulitis (hospitalized 9/11-9/14).  He completed his 10 day course of doxycycline during rehab here and the cellulitis resolved.  He's had problems with tinea pedis contributing while living at home.  He's worked with PT.  OT has assisted in evaluating his functional status and determined that a long-term SNF admission is appropriate for him.  He's been in rehab several times in the past couple of years.  He ambulates with his walker.    His most recent MMSE was 25/30.   While he's been in therapy, he went out to see Dr. [name], his cardiologist, and his chf was felt to be well compensated.  CAD with prior ant MI in 2009 with bare metal stent to LAD and subsequent cath 2013 showed patent stent.  Had normal myoview 1/16.  He's now asymptomatic though he had a prior of angina historically.    11/4, he went out to [place] with Dr. Ardis Hughs' office and she saw him for increased non-bloody diarrhea.  He'd been off remicade since a fungal infection in 2016.  C diff was ruled out.  It turned out he was adding artificial sweeteners to his drinks which may have contributed.  He was continued on bid questran.  His loose stools were NOT felt to be due to Crohn's.    11/15, he saw Dr. Jannifer Franklin and was noted to be having more difficulty getting up out of a chair (needing help), shuffling more.  His sinemet was adjusted so he's now on 50/200 qid with an additional 1/2 tab of 25/160m with the first and third doses for four weeks, then 1/2 tab qid with  the other tablet thereafter.  So far, he's done well with this and is getting around a little better.  He continues to need reminders to use his walker at all times.    He had reported neck stiffness to his nurse this morning and she gave him some tylenol which did help.  We also discussed warm compresses QID and he was agreeable to this for a short time to see if it resolves.  I noticed he had 4 pillows at the head of his bed in disarray.    Weight has  stabilized.  Was dropping at one point in sept when he was ill.   Past Medical History:  Diagnosis Date  . Cellulitis   . Chronic diastolic CHF (congestive heart failure) (HCC)    a. EF initially 35-40% after MI 1/09; b. echo 7/08: EF 60%;  c. 11/2014 Echo: EF 65-70%, Gr 1 DD, mild MR.  . Coronary artery disease    a. s/p anterior STEMI 04/2007 with BMS-> LAD;  b. Cath 12/2011 patent stent;  c. low risk nuc 04/2014.  . Crohn's disease ([place])   . Diaphragmatic hernia without mention of obstruction or gangrene   . Diverticulosis of colon (without mention of hemorrhage)   . Fatty liver    a. on ultrasound of 10/2009  . Flatulence, eructation, and gas pain   . Fungal infection   . Gait disorder   . GERD (gastroesophageal reflux disease)   . HOH (hard of hearing)    Hearing aids  . Hyperlipidemia   . Hypertension   . Ischemic cardiomyopathy    a. EF initially 35-40% after MI 1/09; b. echo 7/08: EF 60%;  c. 11/2014 Echo: EF 65-70%, Gr 1 DD, mild MR.  . Melanoma ([place])   . Memory disorder   . Orthostatic hypotension   . Osteoporosis   . Other chronic nonalcoholic liver disease   . Other esophagitis   . Parkinson's disease ([place])   . RBBB 09/02/2013  . Regional enteritis of small intestine ([place])   . Renal calculi   . Restless leg syndrome 03/22/2015  . 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 Urinary incontinence    Past Surgical History:  Procedure Laterality Date  . APPENDECTOMY    . BACK SURGERY     L3, L4, L5  . CARDIAC CATHETERIZATION  09/25/06   EF 35-40% but more recently 60%  . CATARACT EXTRACTION     Bilateral  . CHOLECYSTECTOMY    . Coronary artery stent placement    . Melanoma resection    . Partial bowel resection    . TONSILLECTOMY      No Known Allergies  Outpatient Encounter Medications as of 02/24/2018  Medication Sig  . acetaminophen (TYLENOL) 325 MG tablet Take 650 mg by mouth every 6 (six) hours as needed for pain.  Marland Kitchen ADVAIR DISKUS 250-50 MCG/DOSE AEPB  Inhale 1 puff into the lungs daily. For shortness of breath  . aspirin EC 81 MG tablet Take 81 mg by mouth daily.  Marland Kitchen atorvastatin (LIPITOR) 40 MG tablet Take 40 mg by mouth daily.  . calcium carbonate (CALCIUM 600) 600 MG TABS tablet Take 600 mg by mouth daily with breakfast.  . carbidopa-levodopa (SINEMET CR) 50-200 MG tablet Take 1 tablet by mouth 4 (four) times daily.  . carbidopa-levodopa (SINEMET IR) 25-100 MG tablet 1/2 tablet with the 1st and 3rd doses of sinemet CR for 4 weeks, then take 1/2 tablet 4 times a day  . carvedilol (  COREG) 3.125 MG tablet take one tablet ONLY if systolic BP greater than 244  . Cholecalciferol (VITAMIN D-3) 1000 UNITS CAPS Take 2,000 Units by mouth daily.   . cholestyramine (QUESTRAN) 4 g packet TAKE 1 PACKET BY MOUTH TWICE DAILY WITH A MEAL  . donepezil (ARICEPT) 10 MG tablet TAKE ONE TABLET AT BEDTIME  . ipratropium (ATROVENT) 0.03 % nasal spray Place 2 sprays into both nostrils 3 (three) times daily.  Marland Kitchen ipratropium-albuterol (DUONEB) 0.5-2.5 (3) MG/3ML SOLN Take 3 mLs by nebulization every 6 (six) hours as needed.  . loperamide (IMODIUM A-D) 2 MG tablet Take 2 mg by mouth 2 (two) times daily as needed for diarrhea or loose stools.  . Melatonin 5 MG TABS Take 1 tablet by mouth at bedtime.   . midodrine (PROAMATINE) 5 MG tablet Take 1 tablet (5 mg total) by mouth 2 (two) times daily with a meal.  . Multiple Vitamins-Minerals (CENTRUM SILVER PO) Take 1 tablet by mouth daily.  . nitroGLYCERIN (NITROSTAT) 0.4 MG SL tablet Place 1 tablet (0.4 mg total) under the tongue every 5 (five) minutes as needed for chest pain. PT OVERDUE FOR OV PLEASE CALL FOR APPT  . pantoprazole (PROTONIX) 40 MG tablet Take 40 mg by mouth daily.   . potassium chloride SA (K-DUR,KLOR-CON) 20 MEQ tablet Take 40 mEq by mouth daily.  . Probiotic Product (ALIGN) 4 MG CAPS Take 1 capsule by mouth daily.   Marland Kitchen torsemide (DEMADEX) 20 MG tablet Take 1 tablet (20 mg total) by mouth daily.  . vitamin  B-12 (CYANOCOBALAMIN) 1000 MCG tablet Take 1,000 mcg by mouth daily.   No facility-administered encounter medications on file as of 02/24/2018.     Review of Systems  Constitutional: Positive for fatigue. Negative for activity change, appetite change, chills and fever.  HENT: Negative for congestion and trouble swallowing.   Eyes: Negative for visual disturbance.  Respiratory: Negative for chest tightness, shortness of breath and wheezing.   Cardiovascular: Negative for chest pain and leg swelling.  Gastrointestinal: Negative for abdominal pain, blood in stool, constipation, diarrhea and nausea.  Genitourinary: Negative for dysuria.  Musculoskeletal: Positive for gait problem, myalgias and neck pain. Negative for arthralgias and joint swelling.  Neurological: Positive for weakness. Negative for dizziness.  Hematological: Bruises/bleeds easily.  Psychiatric/Behavioral: Positive for confusion. Negative for agitation, behavioral problems and sleep disturbance. The patient is not nervous/anxious.     Immunization History  Administered Date(s) Administered  . Influenza-Unspecified 12/06/2014, 11/27/2017   Pertinent  Health Maintenance Due  Topic Date Due  . PNA vac Low Risk Adult (1 of 2 - PCV13) 01/08/1996  . INFLUENZA VACCINE  Completed   Fall Risk  10/03/2017 12/29/2014  Falls in the past year? No Yes  Number falls in past yr: - 1  Injury with Fall? - No  Risk for fall due to : - Impaired balance/gait  Follow up - Falls evaluation completed   Functional Status Survey:    Vitals:   02/24/18 1145  BP: 127/77  Pulse: 94  Resp: 19  Temp: (!) 97.4 F (36.3 C)  TempSrc: Oral  SpO2: 95%  Weight: 164 lb (74.4 kg)  Height: 5' 8"  (1.727 m)   Body mass index is 24.94 kg/m. Physical Exam  Constitutional: He appears well-developed. No distress.  HENT:  Head: Normocephalic and atraumatic.  Right Ear: External ear normal.  Left Ear: External ear normal.  Nose: Nose normal.    Mouth/Throat: Oropharynx is clear and moist.  Eyes: Pupils are  equal, round, and reactive to light. Conjunctivae and EOM are normal.  Neck: Neck supple. No JVD present. No tracheal deviation present. No thyromegaly present.  Cardiovascular: Normal rate, regular rhythm and intact distal pulses.  Murmur heard. Pulmonary/Chest: Effort normal and breath sounds normal. He has no wheezes. He has no rales.  Abdominal: Soft. Bowel sounds are normal. He exhibits no distension and no mass. There is no tenderness. There is no rebound and no guarding.  Musculoskeletal: Normal range of motion.  Shuffling gait, walks with walker, but needs cues to use at times, moves faster than his body allows at times; tenderness of cervical paravertebral muscles since sleeping funny last night  Lymphadenopathy:    He has no cervical adenopathy.  Neurological: He is alert.  Oriented to person, place, not time  Skin: Skin is warm and dry. There is erythema.  Has flushed appearance which is chronic  Psychiatric: He has a normal mood and affect.    Labs reviewed: Recent Labs    12/19/17 0518 12/20/17 0522 02/09/18 1251  NA 137 139 139  K 3.4* 3.5 3.6  CL 100 99 97  CO2 22 28 31   GLUCOSE 116* 117* 127*  BUN 18 21 26*  CREATININE 1.03 1.06 1.26  CALCIUM 8.3* 8.7* 8.6  MG 1.1* 2.0  --   PHOS  --  3.2  --    Recent Labs    09/02/17 1705 12/17/17 2044 12/18/17 0533  AST 27 28 21   ALT 14* 7 13  ALKPHOS 70 83 65  BILITOT 1.8* 2.3* 1.8*  PROT 7.8 8.6* 6.7  ALBUMIN 4.1 4.3 3.3*   Recent Labs    09/02/17 1705 12/17/17 2044  12/19/17 0518 12/20/17 0522 02/09/18 1251  WBC 8.8 18.5*   < > 12.0* 9.9 9.6  NEUTROABS 5.3 16.7*  --   --   --   --   HGB 12.4* 13.1   < > 12.1* 12.3* 14.1  HCT 36.8* 39.7   < > 36.5* 37.6* 41.8  MCV 93.6 94.7   < > 95.3 94.7 92.7  PLT 220 232   < > 201 226 304.0   < > = values in this interval not displayed.   Lab Results  Component Value Date   TSH 2.428 11/12/2014    Assessment/Plan 1. Parkinson disease ([place]) -improved symptoms with medication adjustment -cont sinemet as ordered by Dr. Jannifer Franklin -cont regular use of walker  2. Diarrhea secondary to food allergy -resolved with questran and avoidance of artificial sweeteners  3. Cervical muscle strain, initial encounter -warm compresses up to qid for 20 min intervals and tylenol for pain -avoid awkward sleeping positions with too many pillows  4. Crohn's disease with other complication, unspecified gastrointestinal tract location ([place]) -off meds for this  5. Dementia due to Parkinson's disease without behavioral disturbance ([place]) -continues on aricept for this -seems it developed several years post-PD symptoms   6. Continuous leakage of urine -urinary incontinence ongoing, using adult undergarments, keep clean and dry to prevent candida infections and skin breakdown  7. Chronic diastolic CHF (congestive heart failure) (HCC) -stable for quite a while, follows with Dr. [name], cont coreg, demadex and potassium -monitor bmp   8. Coronary artery disease involving native coronary artery of native heart without angina pectoris, prior BMS -asymptomatic now, cont asa, lipitor, coreg, prn ntg  Family/ staff Communication: discussed with snf nurse  Labs/tests ordered:  No new  Tru Leopard L. Daissy Yerian, D.O. [facility]  [address], [place] [address] Cell Phone (Mon-Fri 8am-5pm):  [phone] On Call:  [phone] & follow prompts after 5pm & weekends Office Phone:  [phone] Office Fax:  [phone]

## 2018-02-25 DIAGNOSIS — R278 Other lack of coordination: Secondary | ICD-10-CM | POA: Diagnosis not present

## 2018-02-25 DIAGNOSIS — N3946 Mixed incontinence: Secondary | ICD-10-CM | POA: Diagnosis not present

## 2018-02-25 DIAGNOSIS — G20 Parkinson's disease: Secondary | ICD-10-CM | POA: Diagnosis not present

## 2018-02-25 DIAGNOSIS — R4181 Age-related cognitive decline: Secondary | ICD-10-CM | POA: Diagnosis not present

## 2018-02-25 DIAGNOSIS — I951 Orthostatic hypotension: Secondary | ICD-10-CM | POA: Diagnosis not present

## 2018-02-25 DIAGNOSIS — A4189 Other specified sepsis: Secondary | ICD-10-CM | POA: Diagnosis not present

## 2018-02-25 DIAGNOSIS — L03115 Cellulitis of right lower limb: Secondary | ICD-10-CM | POA: Diagnosis not present

## 2018-02-25 DIAGNOSIS — R2689 Other abnormalities of gait and mobility: Secondary | ICD-10-CM | POA: Diagnosis not present

## 2018-02-25 DIAGNOSIS — Z9181 History of falling: Secondary | ICD-10-CM | POA: Diagnosis not present

## 2018-02-27 DIAGNOSIS — R4181 Age-related cognitive decline: Secondary | ICD-10-CM | POA: Diagnosis not present

## 2018-02-27 DIAGNOSIS — L03115 Cellulitis of right lower limb: Secondary | ICD-10-CM | POA: Diagnosis not present

## 2018-02-27 DIAGNOSIS — G20 Parkinson's disease: Secondary | ICD-10-CM | POA: Diagnosis not present

## 2018-02-27 DIAGNOSIS — N3946 Mixed incontinence: Secondary | ICD-10-CM | POA: Diagnosis not present

## 2018-02-27 DIAGNOSIS — R2689 Other abnormalities of gait and mobility: Secondary | ICD-10-CM | POA: Diagnosis not present

## 2018-02-27 DIAGNOSIS — I951 Orthostatic hypotension: Secondary | ICD-10-CM | POA: Diagnosis not present

## 2018-02-27 DIAGNOSIS — A4189 Other specified sepsis: Secondary | ICD-10-CM | POA: Diagnosis not present

## 2018-02-27 DIAGNOSIS — R278 Other lack of coordination: Secondary | ICD-10-CM | POA: Diagnosis not present

## 2018-02-27 DIAGNOSIS — Z9181 History of falling: Secondary | ICD-10-CM | POA: Diagnosis not present

## 2018-03-01 DIAGNOSIS — R4181 Age-related cognitive decline: Secondary | ICD-10-CM | POA: Diagnosis not present

## 2018-03-01 DIAGNOSIS — R278 Other lack of coordination: Secondary | ICD-10-CM | POA: Diagnosis not present

## 2018-03-01 DIAGNOSIS — L03115 Cellulitis of right lower limb: Secondary | ICD-10-CM | POA: Diagnosis not present

## 2018-03-01 DIAGNOSIS — A4189 Other specified sepsis: Secondary | ICD-10-CM | POA: Diagnosis not present

## 2018-03-01 DIAGNOSIS — R2689 Other abnormalities of gait and mobility: Secondary | ICD-10-CM | POA: Diagnosis not present

## 2018-03-01 DIAGNOSIS — G20 Parkinson's disease: Secondary | ICD-10-CM | POA: Diagnosis not present

## 2018-03-01 DIAGNOSIS — I951 Orthostatic hypotension: Secondary | ICD-10-CM | POA: Diagnosis not present

## 2018-03-01 DIAGNOSIS — Z9181 History of falling: Secondary | ICD-10-CM | POA: Diagnosis not present

## 2018-03-01 DIAGNOSIS — N3946 Mixed incontinence: Secondary | ICD-10-CM | POA: Diagnosis not present

## 2018-03-02 DIAGNOSIS — N3946 Mixed incontinence: Secondary | ICD-10-CM | POA: Diagnosis not present

## 2018-03-02 DIAGNOSIS — I951 Orthostatic hypotension: Secondary | ICD-10-CM | POA: Diagnosis not present

## 2018-03-02 DIAGNOSIS — R2689 Other abnormalities of gait and mobility: Secondary | ICD-10-CM | POA: Diagnosis not present

## 2018-03-02 DIAGNOSIS — R278 Other lack of coordination: Secondary | ICD-10-CM | POA: Diagnosis not present

## 2018-03-02 DIAGNOSIS — A4189 Other specified sepsis: Secondary | ICD-10-CM | POA: Diagnosis not present

## 2018-03-02 DIAGNOSIS — G20 Parkinson's disease: Secondary | ICD-10-CM | POA: Diagnosis not present

## 2018-03-02 DIAGNOSIS — L03115 Cellulitis of right lower limb: Secondary | ICD-10-CM | POA: Diagnosis not present

## 2018-03-02 DIAGNOSIS — R4181 Age-related cognitive decline: Secondary | ICD-10-CM | POA: Diagnosis not present

## 2018-03-02 DIAGNOSIS — Z9181 History of falling: Secondary | ICD-10-CM | POA: Diagnosis not present

## 2018-03-03 DIAGNOSIS — I951 Orthostatic hypotension: Secondary | ICD-10-CM | POA: Diagnosis not present

## 2018-03-03 DIAGNOSIS — R4181 Age-related cognitive decline: Secondary | ICD-10-CM | POA: Diagnosis not present

## 2018-03-03 DIAGNOSIS — R278 Other lack of coordination: Secondary | ICD-10-CM | POA: Diagnosis not present

## 2018-03-03 DIAGNOSIS — A4189 Other specified sepsis: Secondary | ICD-10-CM | POA: Diagnosis not present

## 2018-03-03 DIAGNOSIS — Z9181 History of falling: Secondary | ICD-10-CM | POA: Diagnosis not present

## 2018-03-03 DIAGNOSIS — G20 Parkinson's disease: Secondary | ICD-10-CM | POA: Diagnosis not present

## 2018-03-03 DIAGNOSIS — R2689 Other abnormalities of gait and mobility: Secondary | ICD-10-CM | POA: Diagnosis not present

## 2018-03-03 DIAGNOSIS — N3946 Mixed incontinence: Secondary | ICD-10-CM | POA: Diagnosis not present

## 2018-03-03 DIAGNOSIS — L03115 Cellulitis of right lower limb: Secondary | ICD-10-CM | POA: Diagnosis not present

## 2018-03-07 DIAGNOSIS — N3945 Continuous leakage: Secondary | ICD-10-CM | POA: Insufficient documentation

## 2018-03-07 DIAGNOSIS — F0280 Dementia in other diseases classified elsewhere without behavioral disturbance: Secondary | ICD-10-CM | POA: Insufficient documentation

## 2018-03-07 DIAGNOSIS — G20 Parkinson's disease: Secondary | ICD-10-CM

## 2018-03-09 DIAGNOSIS — R4181 Age-related cognitive decline: Secondary | ICD-10-CM | POA: Diagnosis not present

## 2018-03-09 DIAGNOSIS — G20 Parkinson's disease: Secondary | ICD-10-CM | POA: Diagnosis not present

## 2018-03-09 DIAGNOSIS — R278 Other lack of coordination: Secondary | ICD-10-CM | POA: Diagnosis not present

## 2018-03-09 DIAGNOSIS — A4189 Other specified sepsis: Secondary | ICD-10-CM | POA: Diagnosis not present

## 2018-03-09 DIAGNOSIS — L03115 Cellulitis of right lower limb: Secondary | ICD-10-CM | POA: Diagnosis not present

## 2018-03-09 DIAGNOSIS — N3946 Mixed incontinence: Secondary | ICD-10-CM | POA: Diagnosis not present

## 2018-03-09 DIAGNOSIS — Z9181 History of falling: Secondary | ICD-10-CM | POA: Diagnosis not present

## 2018-03-09 DIAGNOSIS — I951 Orthostatic hypotension: Secondary | ICD-10-CM | POA: Diagnosis not present

## 2018-03-13 DIAGNOSIS — L400 Psoriasis vulgaris: Secondary | ICD-10-CM | POA: Diagnosis not present

## 2018-03-13 DIAGNOSIS — D485 Neoplasm of uncertain behavior of skin: Secondary | ICD-10-CM | POA: Diagnosis not present

## 2018-03-13 DIAGNOSIS — Z85820 Personal history of malignant melanoma of skin: Secondary | ICD-10-CM | POA: Diagnosis not present

## 2018-03-13 DIAGNOSIS — Z85828 Personal history of other malignant neoplasm of skin: Secondary | ICD-10-CM | POA: Diagnosis not present

## 2018-03-13 DIAGNOSIS — L821 Other seborrheic keratosis: Secondary | ICD-10-CM | POA: Diagnosis not present

## 2018-03-13 DIAGNOSIS — L570 Actinic keratosis: Secondary | ICD-10-CM | POA: Diagnosis not present

## 2018-03-13 DIAGNOSIS — L820 Inflamed seborrheic keratosis: Secondary | ICD-10-CM | POA: Diagnosis not present

## 2018-03-23 ENCOUNTER — Non-Acute Institutional Stay (SKILLED_NURSING_FACILITY): Payer: PPO | Admitting: Adult Health

## 2018-03-23 DIAGNOSIS — F0280 Dementia in other diseases classified elsewhere without behavioral disturbance: Secondary | ICD-10-CM

## 2018-03-23 DIAGNOSIS — K50918 Crohn's disease, unspecified, with other complication: Secondary | ICD-10-CM | POA: Diagnosis not present

## 2018-03-23 DIAGNOSIS — I951 Orthostatic hypotension: Secondary | ICD-10-CM | POA: Diagnosis not present

## 2018-03-23 DIAGNOSIS — N3945 Continuous leakage: Secondary | ICD-10-CM | POA: Diagnosis not present

## 2018-03-23 DIAGNOSIS — I5032 Chronic diastolic (congestive) heart failure: Secondary | ICD-10-CM

## 2018-03-23 DIAGNOSIS — G20 Parkinson's disease: Secondary | ICD-10-CM | POA: Diagnosis not present

## 2018-03-23 DIAGNOSIS — I10 Essential (primary) hypertension: Secondary | ICD-10-CM

## 2018-03-26 ENCOUNTER — Encounter: Payer: Self-pay | Admitting: Adult Health

## 2018-03-26 NOTE — Progress Notes (Signed)
Location:  Occupational psychologist of Service:  SNF (31) Provider:   Cindi Carbon, ANP [place] [phone]   Gayland Curry, DO  Patient Care Team: Gayland Curry, DO as PCP - General (Geriatric Medicine) [name], [name], MD as Consulting Physician (Cardiology) [name], MD as Attending Physician (Gastroenterology) [name], MD as Consulting Physician (Neurology) Community, [facility] ([place]) Royal Hawthorn, NP as Nurse Practitioner (Nurse Practitioner)  Extended Emergency Contact Information Primary Emergency Contact: [name] Address: [place]          [place] Phone: [phone] Mobile Phone: [phone] Relation: Spouse  Code Status:  DNR Goals of care: Advanced Directive information Advanced Directives 02/24/2018  Does Patient Have a Medical Advance Directive? Yes  Type of Paramedic of [place];Living will;Out of facility DNR (pink MOST or yellow form)  Does patient want to make changes to medical advance directive? No - Patient declined  Copy of [place] in Chart? Yes - validated most recent copy scanned in chart (See row information)  Pre-existing out of facility DNR order (yellow form or pink MOST form) Yellow form placed in chart (order not valid for inpatient use)     Chief Complaint  Patient presents with  . Medical Management of Chronic Issues    HPI:  Pt is a 84 y.o. male seen today for medical management of chronic diseases.  He is adjusting well to the skilled care unit after [place] and later skilled care due to inability to self care with large incontinence episodes of stool/urine. The staff are toileting him regularly and avoiding lactose and there have been no concerns regarding his bowels or urine. Appetite is good per patient. He denies any pain, decrease  appetite, sob, cp, etc.   CHF: Weights are stable with no PND, CP, SOB, or DOE. NO increased edema  Echo 65-70% with grade 1 DDD by echo Aug of 2016 Wt Readings from Last 3 Encounters:  03/26/18 162 lb 9.6 oz (73.8 kg)  02/24/18 164 lb (74.4 kg)  02/09/18 160 lb (72.6 kg)   PD: doing well but minimally ambulatory. Mostly uses a WC. No reports of increased tremor or rigidity.  Crohn's dz: no reports of diarrhea, rectal bleeding or abd pain. Off remicade for quite some time  Dementia associated with PD: MMSE 25/30 on 12/30/17. Progressive short term memory loss. No behavioral issues.   BP controlled, no reports of orthostatic hypotension.   Past Medical History:  Diagnosis Date  . Cellulitis   . Chronic diastolic CHF (congestive heart failure) (HCC)    a. EF initially 35-40% after MI 1/09; b. echo 7/08: EF 60%;  c. 11/2014 Echo: EF 65-70%, Gr 1 DD, mild MR.  . Coronary artery disease    a. s/p anterior STEMI 04/2007 with BMS-> LAD;  b. Cath 12/2011 patent stent;  c. low risk nuc 04/2014.  . Crohn's disease ([place])   . Diaphragmatic hernia without mention of obstruction or gangrene   . Diverticulosis of colon (without mention of hemorrhage)   . Fatty liver    a. on ultrasound of 10/2009  . Flatulence, eructation, and gas pain   . Fungal infection   . Gait disorder   . GERD (gastroesophageal reflux disease)   . HOH (hard of hearing)    Hearing aids  . Hyperlipidemia   . Hypertension   .  Ischemic cardiomyopathy    a. EF initially 35-40% after MI 1/09; b. echo 7/08: EF 60%;  c. 11/2014 Echo: EF 65-70%, Gr 1 DD, mild MR.  . Melanoma ([place])   . Memory disorder   . Orthostatic hypotension   . Osteoporosis   . Other chronic nonalcoholic liver disease   . Other esophagitis   . Parkinson's disease ([place])   . RBBB 09/02/2013  . Regional enteritis of small intestine ([place])   . Renal calculi   . Restless leg syndrome 03/22/2015  . 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 Urinary incontinence     Past Surgical History:  Procedure Laterality Date  . APPENDECTOMY    . BACK SURGERY     L3, L4, L5  . CARDIAC CATHETERIZATION  09/25/06   EF 35-40% but more recently 60%  . CATARACT EXTRACTION     Bilateral  . CHOLECYSTECTOMY    . Coronary artery stent placement    . Melanoma resection    . Partial bowel resection    . TONSILLECTOMY      No Known Allergies  Outpatient Encounter Medications as of 03/23/2018  Medication Sig  . acetaminophen (TYLENOL) 325 MG tablet Take 650 mg by mouth every 6 (six) hours as needed for pain.  Marland Kitchen ADVAIR DISKUS 250-50 MCG/DOSE AEPB Inhale 1 puff into the lungs daily. For shortness of breath  . aspirin EC 81 MG tablet Take 81 mg by mouth daily.  Marland Kitchen atorvastatin (LIPITOR) 40 MG tablet Take 40 mg by mouth daily.  . calcium carbonate (CALCIUM 600) 600 MG TABS tablet Take 600 mg by mouth daily with breakfast.  . carbidopa-levodopa (SINEMET CR) 50-200 MG tablet Take 1 tablet by mouth 4 (four) times daily.  . carbidopa-levodopa (SINEMET IR) 25-100 MG tablet 1/2 tablet with the 1st and 3rd doses of sinemet CR for 4 weeks, then take 1/2 tablet 4 times a day  . carvedilol (COREG) 3.125 MG tablet take one tablet ONLY if systolic BP greater than 638  . Cholecalciferol (VITAMIN D-3) 1000 UNITS CAPS Take 2,000 Units by mouth daily.   . cholestyramine (QUESTRAN) 4 g packet TAKE 1 PACKET BY MOUTH TWICE DAILY WITH A MEAL  . donepezil (ARICEPT) 10 MG tablet TAKE ONE TABLET AT BEDTIME  . ipratropium (ATROVENT) 0.03 % nasal spray Place 2 sprays into both nostrils 3 (three) times daily.  Marland Kitchen ipratropium-albuterol (DUONEB) 0.5-2.5 (3) MG/3ML SOLN Take 3 mLs by nebulization every 6 (six) hours as needed.  . loperamide (IMODIUM A-D) 2 MG tablet Take 2 mg by mouth 2 (two) times daily as needed for diarrhea or loose stools.  . Melatonin 5 MG TABS Take 1 tablet by mouth at bedtime.   . midodrine (PROAMATINE) 5 MG tablet Take 1 tablet (5 mg total) by mouth 2 (two) times daily with  a meal.  . Multiple Vitamins-Minerals (CENTRUM SILVER PO) Take 1 tablet by mouth daily.  . nitroGLYCERIN (NITROSTAT) 0.4 MG SL tablet Place 1 tablet (0.4 mg total) under the tongue every 5 (five) minutes as needed for chest pain. PT OVERDUE FOR OV PLEASE CALL FOR APPT  . pantoprazole (PROTONIX) 40 MG tablet Take 40 mg by mouth daily.   . potassium chloride SA (K-DUR,KLOR-CON) 20 MEQ tablet Take 40 mEq by mouth daily.  . Probiotic Product (ALIGN) 4 MG CAPS Take 1 capsule by mouth daily.   Marland Kitchen torsemide (DEMADEX) 20 MG tablet Take 1 tablet (20 mg total) by mouth daily.  . vitamin B-12 (CYANOCOBALAMIN) 1000 MCG  tablet Take 1,000 mcg by mouth daily.   No facility-administered encounter medications on file as of 03/23/2018.     Review of Systems  Constitutional: Negative for activity change, appetite change, chills, diaphoresis, fatigue, fever and unexpected weight change.  Respiratory: Negative for cough, shortness of breath, wheezing and stridor.   Cardiovascular: Positive for leg swelling. Negative for chest pain and palpitations.  Gastrointestinal: Negative for abdominal distention, abdominal pain, constipation and diarrhea.  Genitourinary: Negative for difficulty urinating and dysuria.       Incontinent of stool and urine  Musculoskeletal: Positive for gait problem. Negative for arthralgias, back pain, joint swelling and myalgias.  Neurological: Positive for weakness. Negative for dizziness, seizures, syncope, facial asymmetry, speech difficulty and headaches.  Hematological: Negative for adenopathy. Does not bruise/bleed easily.  Psychiatric/Behavioral: Positive for confusion. Negative for agitation and behavioral problems.    Immunization History  Administered Date(s) Administered  . Influenza-Unspecified 12/06/2014, 11/27/2017  . Pneumococcal Polysaccharide-23 05/21/2005  . Tdap 06/03/2006   Pertinent  Health Maintenance Due  Topic Date Due  . PNA vac Low Risk Adult (2 of 2 - PCV13)  05/21/2006  . INFLUENZA VACCINE  Completed   Fall Risk  10/03/2017 12/29/2014  Falls in the past year? No Yes  Number falls in past yr: - 1  Injury with Fall? - No  Risk for fall due to : - Impaired balance/gait  Follow up - Falls evaluation completed   Functional Status Survey:    Vitals:   03/26/18 1213  Weight: 162 lb 9.6 oz (73.8 kg)   Body mass index is 24.72 kg/m. Physical Exam Vitals signs and nursing note reviewed.  Constitutional:      General: He is not in acute distress.    Appearance: He is not diaphoretic.  HENT:     Head: Normocephalic and atraumatic.  Neck:     Musculoskeletal: Normal range of motion and neck supple.     Thyroid: No thyromegaly.     Vascular: No JVD.     Trachea: No tracheal deviation.  Cardiovascular:     Rate and Rhythm: Normal rate and regular rhythm.     Heart sounds: No murmur.  Pulmonary:     Effort: Pulmonary effort is normal. No respiratory distress.     Breath sounds: Normal breath sounds. No wheezing.  Abdominal:     General: Bowel sounds are normal. There is no distension.     Palpations: Abdomen is soft.     Tenderness: There is no abdominal tenderness.  Lymphadenopathy:     Cervical: No cervical adenopathy.  Skin:    General: Skin is warm and dry.  Neurological:     Mental Status: He is alert.     Cranial Nerves: No cranial nerve deficit.     Comments: Oriented x 2. MAE. Able to f/c. No rigidity noted   Psychiatric:        Mood and Affect: Mood normal.     Labs reviewed: Recent Labs    12/19/17 0518 12/20/17 0522 02/09/18 1251  NA 137 139 139  K 3.4* 3.5 3.6  CL 100 99 97  CO2 22 28 31   GLUCOSE 116* 117* 127*  BUN 18 21 26*  CREATININE 1.03 1.06 1.26  CALCIUM 8.3* 8.7* 8.6  MG 1.1* 2.0  --   PHOS  --  3.2  --    Recent Labs    09/02/17 1705 12/17/17 2044 12/18/17 0533  AST 27 28 21   ALT 14* 7 13  ALKPHOS 70 83 65  BILITOT 1.8* 2.3* 1.8*  PROT 7.8 8.6* 6.7  ALBUMIN 4.1 4.3 3.3*   Recent Labs      09/02/17 1705 12/17/17 2044  12/19/17 0518 12/20/17 0522 02/09/18 1251  WBC 8.8 18.5*   < > 12.0* 9.9 9.6  NEUTROABS 5.3 16.7*  --   --   --   --   HGB 12.4* 13.1   < > 12.1* 12.3* 14.1  HCT 36.8* 39.7   < > 36.5* 37.6* 41.8  MCV 93.6 94.7   < > 95.3 94.7 92.7  PLT 220 232   < > 201 226 304.0   < > = values in this interval not displayed.   Lab Results  Component Value Date   TSH 2.428 11/12/2014   No results found for: HGBA1C No results found for: CHOL, HDL, LDLCALC, LDLDIRECT, TRIG, CHOLHDL  Significant Diagnostic Results in last 30 days:  No results found.  Assessment/Plan 1. Dementia due to Parkinson's disease without behavioral disturbance ([place]) Progressive decline necessitating move to skilled care. Adjusting well. Continue Aricept 10 mg qhs  2. Chronic diastolic CHF (congestive heart failure) (HCC) Compensated.  Continue Torsemide 20 mg qd  3. Essential hypertension Controlled  4. Orthostatic hypotension Continue midodrine 5 mg BID  5. Crohn's disease with other complication, unspecified gastrointestinal tract location ([place]) Followed by GI Continue Questran  6. Parkinson disease ([place]) Followed by neurology, doing well with current dosage of sinemet    Family/ staff Communication: discussed with staff and resident  Labs/tests ordered:  NA

## 2018-04-24 DIAGNOSIS — R278 Other lack of coordination: Secondary | ICD-10-CM | POA: Diagnosis not present

## 2018-04-24 DIAGNOSIS — M6389 Disorders of muscle in diseases classified elsewhere, multiple sites: Secondary | ICD-10-CM | POA: Diagnosis not present

## 2018-04-24 DIAGNOSIS — G20 Parkinson's disease: Secondary | ICD-10-CM | POA: Diagnosis not present

## 2018-04-24 DIAGNOSIS — R296 Repeated falls: Secondary | ICD-10-CM | POA: Diagnosis not present

## 2018-04-27 DIAGNOSIS — R278 Other lack of coordination: Secondary | ICD-10-CM | POA: Diagnosis not present

## 2018-04-27 DIAGNOSIS — G20 Parkinson's disease: Secondary | ICD-10-CM | POA: Diagnosis not present

## 2018-04-27 DIAGNOSIS — M6389 Disorders of muscle in diseases classified elsewhere, multiple sites: Secondary | ICD-10-CM | POA: Diagnosis not present

## 2018-04-27 DIAGNOSIS — R296 Repeated falls: Secondary | ICD-10-CM | POA: Diagnosis not present

## 2018-04-28 DIAGNOSIS — R296 Repeated falls: Secondary | ICD-10-CM | POA: Diagnosis not present

## 2018-04-28 DIAGNOSIS — M6389 Disorders of muscle in diseases classified elsewhere, multiple sites: Secondary | ICD-10-CM | POA: Diagnosis not present

## 2018-04-28 DIAGNOSIS — G20 Parkinson's disease: Secondary | ICD-10-CM | POA: Diagnosis not present

## 2018-04-28 DIAGNOSIS — R278 Other lack of coordination: Secondary | ICD-10-CM | POA: Diagnosis not present

## 2018-04-29 DIAGNOSIS — M6389 Disorders of muscle in diseases classified elsewhere, multiple sites: Secondary | ICD-10-CM | POA: Diagnosis not present

## 2018-04-29 DIAGNOSIS — R278 Other lack of coordination: Secondary | ICD-10-CM | POA: Diagnosis not present

## 2018-04-29 DIAGNOSIS — R296 Repeated falls: Secondary | ICD-10-CM | POA: Diagnosis not present

## 2018-04-29 DIAGNOSIS — G20 Parkinson's disease: Secondary | ICD-10-CM | POA: Diagnosis not present

## 2018-04-30 DIAGNOSIS — R278 Other lack of coordination: Secondary | ICD-10-CM | POA: Diagnosis not present

## 2018-04-30 DIAGNOSIS — G20 Parkinson's disease: Secondary | ICD-10-CM | POA: Diagnosis not present

## 2018-04-30 DIAGNOSIS — R296 Repeated falls: Secondary | ICD-10-CM | POA: Diagnosis not present

## 2018-04-30 DIAGNOSIS — M6389 Disorders of muscle in diseases classified elsewhere, multiple sites: Secondary | ICD-10-CM | POA: Diagnosis not present

## 2018-05-01 DIAGNOSIS — G20 Parkinson's disease: Secondary | ICD-10-CM | POA: Diagnosis not present

## 2018-05-01 DIAGNOSIS — R278 Other lack of coordination: Secondary | ICD-10-CM | POA: Diagnosis not present

## 2018-05-01 DIAGNOSIS — M6389 Disorders of muscle in diseases classified elsewhere, multiple sites: Secondary | ICD-10-CM | POA: Diagnosis not present

## 2018-05-01 DIAGNOSIS — R296 Repeated falls: Secondary | ICD-10-CM | POA: Diagnosis not present

## 2018-05-04 DIAGNOSIS — G20 Parkinson's disease: Secondary | ICD-10-CM | POA: Diagnosis not present

## 2018-05-04 DIAGNOSIS — R278 Other lack of coordination: Secondary | ICD-10-CM | POA: Diagnosis not present

## 2018-05-04 DIAGNOSIS — M6389 Disorders of muscle in diseases classified elsewhere, multiple sites: Secondary | ICD-10-CM | POA: Diagnosis not present

## 2018-05-04 DIAGNOSIS — R296 Repeated falls: Secondary | ICD-10-CM | POA: Diagnosis not present

## 2018-05-05 DIAGNOSIS — R296 Repeated falls: Secondary | ICD-10-CM | POA: Diagnosis not present

## 2018-05-05 DIAGNOSIS — R278 Other lack of coordination: Secondary | ICD-10-CM | POA: Diagnosis not present

## 2018-05-05 DIAGNOSIS — M6389 Disorders of muscle in diseases classified elsewhere, multiple sites: Secondary | ICD-10-CM | POA: Diagnosis not present

## 2018-05-05 DIAGNOSIS — G20 Parkinson's disease: Secondary | ICD-10-CM | POA: Diagnosis not present

## 2018-05-06 DIAGNOSIS — G20 Parkinson's disease: Secondary | ICD-10-CM | POA: Diagnosis not present

## 2018-05-06 DIAGNOSIS — R278 Other lack of coordination: Secondary | ICD-10-CM | POA: Diagnosis not present

## 2018-05-06 DIAGNOSIS — R296 Repeated falls: Secondary | ICD-10-CM | POA: Diagnosis not present

## 2018-05-06 DIAGNOSIS — M6389 Disorders of muscle in diseases classified elsewhere, multiple sites: Secondary | ICD-10-CM | POA: Diagnosis not present

## 2018-05-07 DIAGNOSIS — G20 Parkinson's disease: Secondary | ICD-10-CM | POA: Diagnosis not present

## 2018-05-07 DIAGNOSIS — R296 Repeated falls: Secondary | ICD-10-CM | POA: Diagnosis not present

## 2018-05-07 DIAGNOSIS — R278 Other lack of coordination: Secondary | ICD-10-CM | POA: Diagnosis not present

## 2018-05-07 DIAGNOSIS — M6389 Disorders of muscle in diseases classified elsewhere, multiple sites: Secondary | ICD-10-CM | POA: Diagnosis not present

## 2018-05-08 DIAGNOSIS — M6389 Disorders of muscle in diseases classified elsewhere, multiple sites: Secondary | ICD-10-CM | POA: Diagnosis not present

## 2018-05-08 DIAGNOSIS — G20 Parkinson's disease: Secondary | ICD-10-CM | POA: Diagnosis not present

## 2018-05-08 DIAGNOSIS — R278 Other lack of coordination: Secondary | ICD-10-CM | POA: Diagnosis not present

## 2018-05-08 DIAGNOSIS — R296 Repeated falls: Secondary | ICD-10-CM | POA: Diagnosis not present

## 2018-05-11 DIAGNOSIS — R278 Other lack of coordination: Secondary | ICD-10-CM | POA: Diagnosis not present

## 2018-05-11 DIAGNOSIS — M6389 Disorders of muscle in diseases classified elsewhere, multiple sites: Secondary | ICD-10-CM | POA: Diagnosis not present

## 2018-05-11 DIAGNOSIS — G20 Parkinson's disease: Secondary | ICD-10-CM | POA: Diagnosis not present

## 2018-05-11 DIAGNOSIS — R296 Repeated falls: Secondary | ICD-10-CM | POA: Diagnosis not present

## 2018-05-12 ENCOUNTER — Encounter: Payer: Self-pay | Admitting: Internal Medicine

## 2018-05-12 ENCOUNTER — Non-Acute Institutional Stay (SKILLED_NURSING_FACILITY): Payer: PPO | Admitting: Internal Medicine

## 2018-05-12 DIAGNOSIS — M159 Polyosteoarthritis, unspecified: Secondary | ICD-10-CM

## 2018-05-12 DIAGNOSIS — G20 Parkinson's disease: Secondary | ICD-10-CM | POA: Diagnosis not present

## 2018-05-12 DIAGNOSIS — I10 Essential (primary) hypertension: Secondary | ICD-10-CM | POA: Diagnosis not present

## 2018-05-12 DIAGNOSIS — F0280 Dementia in other diseases classified elsewhere without behavioral disturbance: Secondary | ICD-10-CM

## 2018-05-12 DIAGNOSIS — I951 Orthostatic hypotension: Secondary | ICD-10-CM

## 2018-05-12 DIAGNOSIS — I5032 Chronic diastolic (congestive) heart failure: Secondary | ICD-10-CM | POA: Diagnosis not present

## 2018-05-12 DIAGNOSIS — M150 Primary generalized (osteo)arthritis: Secondary | ICD-10-CM | POA: Diagnosis not present

## 2018-05-12 DIAGNOSIS — G20A1 Parkinson's disease without dyskinesia, without mention of fluctuations: Secondary | ICD-10-CM

## 2018-05-12 DIAGNOSIS — K50918 Crohn's disease, unspecified, with other complication: Secondary | ICD-10-CM

## 2018-05-12 DIAGNOSIS — M8949 Other hypertrophic osteoarthropathy, multiple sites: Secondary | ICD-10-CM

## 2018-05-12 NOTE — Progress Notes (Signed)
[name], male   DOB: 02-19-1929, 85 y.o.   MRN: [id number]  Location:  [facility] Room Number: 678 Place of Service:  SNF ([id number]) Provider:   Gayland Curry, DO  Patient Care Team: Gayland Curry, DO as PCP - General (Geriatric Medicine) [name], [name], MD as Consulting Physician (Cardiology) [name], MD as Attending Physician (Gastroenterology) [name], MD as Consulting Physician (Neurology) Community, [facility] ([place]) Royal Hawthorn, NP as Nurse Practitioner (Nurse Practitioner)  Extended Emergency Contact Information Primary Emergency Contact: [name] Address: [place]          [name] Phone: [phone] Mobile Phone: [phone] Relation: Spouse  Code Status:  DNR Goals of care: Advanced Directive information Advanced Directives 02/24/2018  Does Patient Have a Medical Advance Directive? Yes  Type of Paramedic of [place];Living will;Out of facility DNR (pink MOST or yellow form)  Does patient want to make changes to medical advance directive? No - Patient declined  Copy of [place] in Chart? Yes - validated most recent copy scanned in chart (See row information)  Pre-existing out of facility DNR order (yellow form or pink MOST form) Yellow form placed in chart (order not valid for inpatient use)     Chief Complaint  Patient presents with  . Medical Management of Chronic Issues    Routine Visit    HPI:  Pt is a 85 y.o. male seen today for medical management of chronic diseases.    [name] has a h/o Crohn's disease, parkinson's disease, associated dementia and orthostatic hypotension, NASH, erosive esophagitis, restless leg syndrome, CAD with ischemic cardiomyopathy, chronic diastolic chf here in snf for long term care since his last rehab stay when it was concluded he  required assistance with adls and meds and [place].  He'd been to rehab several times.  When seen today initially by NP student Cleophas Dunker, there were reports that he was having "explosive diarrhea" episodes; however, when records of bms were explored they showed just one bm today, one yesterday, 2 on 2/2 and 1 on 2/1 and no evidence that these were severe with incontinence or complications of any sort.  Pt reports some worsening of bowels lately, but the documentation does not support this and he does have some cognitive loss.  He also has not received his loperamide 2 bid prn for this concern.  He is also on align probiotic and cholestyramine scheduled bid with meals.    Other recent events included a rash in his left axilla and neck in December since resolved and he's been off duonebs for congestion since early Jan.    OT has been working with him on using his power chair.  He reports otherwise feeling well.  He has some arthritic pain in his hands and sometimes neck that respond to tylenol which is prn.  He requested one when we were seeing him today.    Weight up 8-10 lbs since move to SNF due to improved regular meals and nursing support.  No increase in edema or dyspnea.  Past Medical History:  Diagnosis Date  . Cellulitis   . Chronic diastolic CHF (congestive heart failure) (HCC)    a. EF initially 35-40% after MI 1/09; b. echo 7/08: EF 60%;  c. 11/2014 Echo: EF 65-70%, Gr 1 DD, mild MR.  Marland Kitchen  Coronary artery disease    a. s/p anterior STEMI 04/2007 with BMS-> LAD;  b. Cath 12/2011 patent stent;  c. low risk nuc 04/2014.  . Crohn's disease ([place])   . Diaphragmatic hernia without mention of obstruction or gangrene   . Diverticulosis of colon (without mention of hemorrhage)   . Fatty liver    a. on ultrasound of 10/2009  . Flatulence, eructation, and gas pain   . Fungal infection   . Gait disorder   . GERD (gastroesophageal reflux disease)   . HOH (hard of  hearing)    Hearing aids  . Hyperlipidemia   . Hypertension   . Ischemic cardiomyopathy    a. EF initially 35-40% after MI 1/09; b. echo 7/08: EF 60%;  c. 11/2014 Echo: EF 65-70%, Gr 1 DD, mild MR.  . Melanoma ([place])   . Memory disorder   . Orthostatic hypotension   . Osteoporosis   . Other chronic nonalcoholic liver disease   . Other esophagitis   . Parkinson's disease ([place])   . RBBB 09/02/2013  . Regional enteritis of small intestine ([place])   . Renal calculi   . Restless leg syndrome 03/22/2015  . Ulcerative (chronic) ileocolitis ([place])   . Urinary incontinence    Past Surgical History:  Procedure Laterality Date  . APPENDECTOMY    . BACK SURGERY     L3, L4, L5  . CARDIAC CATHETERIZATION  09/25/06   EF 35-40% but more recently 60%  . CATARACT EXTRACTION     Bilateral  . CHOLECYSTECTOMY    . Coronary artery stent placement    . Melanoma resection    . Partial bowel resection    . TONSILLECTOMY      No Known Allergies  Outpatient Encounter Medications as of 05/12/2018  Medication Sig  . acetaminophen (TYLENOL) 325 MG tablet Take 650 mg by mouth every 6 (six) hours as needed for pain.  Marland Kitchen ADVAIR DISKUS 250-50 MCG/DOSE AEPB Inhale 1 puff into the lungs daily. For shortness of breath  . aspirin EC 81 MG tablet Take 81 mg by mouth daily.  Marland Kitchen atorvastatin (LIPITOR) 40 MG tablet Take 40 mg by mouth daily.  . calcium carbonate (CALCIUM 600) 600 MG TABS tablet Take 600 mg by mouth daily with breakfast.  . carbidopa-levodopa (SINEMET CR) 50-200 MG tablet Take 1 tablet by mouth 4 (four) times daily.  . carbidopa-levodopa (SINEMET IR) 25-100 MG tablet 1/2 tablet with the 1st and 3rd doses of sinemet CR for 4 weeks, then take 1/2 tablet 4 times a day  . carvedilol (COREG) 3.125 MG tablet take one tablet ONLY if systolic BP greater than 275  . Cholecalciferol (VITAMIN D-3) 1000 UNITS CAPS Take 2,000 Units by mouth daily.   . cholestyramine (QUESTRAN) 4 g packet TAKE 1 PACKET BY MOUTH TWICE  DAILY WITH A MEAL  . donepezil (ARICEPT) 10 MG tablet TAKE ONE TABLET AT BEDTIME  . ipratropium (ATROVENT) 0.03 % nasal spray Place 2 sprays into both nostrils 3 (three) times daily.  Marland Kitchen loperamide (IMODIUM A-D) 2 MG tablet Take 2 mg by mouth 2 (two) times daily as needed for diarrhea or loose stools.  . Melatonin 5 MG TABS Take 1 tablet by mouth at bedtime.   . midodrine (PROAMATINE) 5 MG tablet Take 1 tablet (5 mg total) by mouth 2 (two) times daily with a meal.  . Multiple Vitamins-Minerals (CENTRUM SILVER PO) Take 1 tablet by mouth daily.  . nitroGLYCERIN (NITROSTAT) 0.4 MG SL tablet Place  1 tablet (0.4 mg total) under the tongue every 5 (five) minutes as needed for chest pain. PT OVERDUE FOR OV PLEASE CALL FOR APPT  . pantoprazole (PROTONIX) 40 MG tablet Take 40 mg by mouth daily.   . potassium chloride SA (K-DUR,KLOR-CON) 20 MEQ tablet Take 40 mEq by mouth daily.  . Probiotic Product (ALIGN) 4 MG CAPS Take 1 capsule by mouth daily.   Marland Kitchen torsemide (DEMADEX) 20 MG tablet Take 1 tablet (20 mg total) by mouth daily.  . vitamin B-12 (CYANOCOBALAMIN) 1000 MCG tablet Take 1,000 mcg by mouth daily.  . [DISCONTINUED] ipratropium-albuterol (DUONEB) 0.5-2.5 (3) MG/3ML SOLN Take 3 mLs by nebulization every 6 (six) hours as needed.   No facility-administered encounter medications on file as of 05/12/2018.     Review of Systems  Constitutional: Negative for activity change, appetite change, chills, fatigue, fever and unexpected weight change.  HENT: Positive for hearing loss. Negative for congestion and trouble swallowing.   Eyes: Negative for visual disturbance.  Respiratory: Negative for chest tightness and shortness of breath.   Cardiovascular: Negative for chest pain, palpitations and leg swelling.  Gastrointestinal: Positive for diarrhea. Negative for abdominal pain, constipation, nausea and vomiting.       Incontinence  Endocrine: Negative for polydipsia.  Genitourinary: Negative for dysuria.        Incontinence  Musculoskeletal: Positive for arthralgias and gait problem.  Skin: Negative for color change.  Neurological: Negative for dizziness and weakness.       Uses power chair, walker short distances in apt  Hematological: Bruises/bleeds easily.  Psychiatric/Behavioral: Positive for confusion. Negative for agitation, hallucinations and sleep disturbance. The patient is not nervous/anxious.     Immunization History  Administered Date(s) Administered  . Influenza-Unspecified 12/06/2014, 11/27/2017  . Pneumococcal Polysaccharide-23 05/21/2005  . Tdap 06/03/2006   Pertinent  Health Maintenance Due  Topic Date Due  . PNA vac Low Risk Adult (2 of 2 - PCV13) 05/21/2006  . INFLUENZA VACCINE  Completed   Fall Risk  10/03/2017 12/29/2014  Falls in the past year? No Yes  Number falls in past yr: - 1  Injury with Fall? - No  Risk for fall due to : - Impaired balance/gait  Follow up - Falls evaluation completed   Functional Status Survey:    Vitals:   05/12/18 1346  BP: 128/77  Pulse: 85  Resp: 20  Temp: 98.2 F (36.8 C)  TempSrc: Oral  SpO2: 96%  Weight: 172 lb (78 kg)  Height: 5' 8"  (1.727 m)   Body mass index is 26.15 kg/m. Physical Exam Vitals signs and nursing note reviewed.  Constitutional:      Appearance: Normal appearance. He is normal weight.  HENT:     Head: Normocephalic and atraumatic.     Right Ear: External ear normal.     Left Ear: External ear normal.     Nose: Rhinorrhea present.     Mouth/Throat:     Pharynx: Oropharynx is clear.  Eyes:     Extraocular Movements: Extraocular movements intact.     Pupils: Pupils are equal, round, and reactive to light.  Cardiovascular:     Rate and Rhythm: Normal rate and regular rhythm.  Pulmonary:     Effort: Pulmonary effort is normal.     Breath sounds: Normal breath sounds. No rales.  Abdominal:     General: Bowel sounds are normal.     Palpations: Abdomen is soft.  Musculoskeletal: Normal range of  motion.  General: No tenderness.  Skin:    General: Skin is warm and dry.  Neurological:     General: No focal deficit present.     Mental Status: He is alert.     Comments: Oriented to person, place, not time  Psychiatric:        Mood and Affect: Mood normal.        Behavior: Behavior normal.     Labs reviewed: Recent Labs    12/19/17 0518 12/20/17 0522 02/09/18 1251  NA 137 139 139  K 3.4* 3.5 3.6  CL 100 99 97  CO2 22 28 31   GLUCOSE 116* 117* 127*  BUN 18 21 26*  CREATININE 1.03 1.06 1.26  CALCIUM 8.3* 8.7* 8.6  MG 1.1* 2.0  --   PHOS  --  3.2  --    Recent Labs    09/02/17 1705 12/17/17 2044 12/18/17 0533  AST 27 28 21   ALT 14* 7 13  ALKPHOS 70 83 65  BILITOT 1.8* 2.3* 1.8*  PROT 7.8 8.6* 6.7  ALBUMIN 4.1 4.3 3.3*   Recent Labs    09/02/17 1705 12/17/17 2044  12/19/17 0518 12/20/17 0522 02/09/18 1251  WBC 8.8 18.5*   < > 12.0* 9.9 9.6  NEUTROABS 5.3 16.7*  --   --   --   --   HGB 12.4* 13.1   < > 12.1* 12.3* 14.1  HCT 36.8* 39.7   < > 36.5* 37.6* 41.8  MCV 93.6 94.7   < > 95.3 94.7 92.7  PLT 220 232   < > 201 226 304.0   < > = values in this interval not displayed.   Lab Results  Component Value Date   TSH 2.428 11/12/2014   No results found for: HGBA1C No results found for: CHOL, HDL, LDLCALC, LDLDIRECT, TRIG, CHOLHDL  Significant Diagnostic Results in last 30 days:  No results found.  Assessment/Plan 1. Dementia due to Parkinson's disease without behavioral disturbance (HCC) -moderate stages, is on aricept for his memory loss, cont at this time as he remains active in programs and no clear change in bowels from this med  2. Chronic CHF (congestive heart failure) (HCC) -stable clinically, cont same regimen with torsemide and potassium, monitor bmp about quarterly  3. Crohn's disease with other complication, unspecified gastrointestinal tract location [facility]) -seems stable based on documentation of bms but stories from pt and CNAs  suggest recently more explosive diarrhea, requested clear notation when this occurs if med changes needed -for now, cont same regimen and use prns if problematic  4. Orthostatic hypotension -not currently c/o dizziness, has had several falls in past year but still lived at home for much of that year -cont midodrine which has been helpful  5. Essential hypertension -bp at goal with current therapy, cont same regimen due to CAD/CHF  6. Parkinson disease ([place]) -cont sinemet regimen per neurology for this and resteless legs  7. Primary osteoarthritis involving multiple joints -cont tylenol when needed  Family/ staff Communication: discussed with snf nurse  Labs/tests ordered:  Needs prevnar done  Channie Bostick L. Daryl Quiros, D.O. [facility] [address], [place] [address] Cell Phone (Mon-Fri 8am-5pm):  [phone] On Call:  [phone] & follow prompts after 5pm & weekends Office Phone:  [phone] Office Fax:  [phone]

## 2018-05-13 DIAGNOSIS — R296 Repeated falls: Secondary | ICD-10-CM | POA: Diagnosis not present

## 2018-05-13 DIAGNOSIS — G20 Parkinson's disease: Secondary | ICD-10-CM | POA: Diagnosis not present

## 2018-05-13 DIAGNOSIS — M6389 Disorders of muscle in diseases classified elsewhere, multiple sites: Secondary | ICD-10-CM | POA: Diagnosis not present

## 2018-05-13 DIAGNOSIS — R278 Other lack of coordination: Secondary | ICD-10-CM | POA: Diagnosis not present

## 2018-05-14 DIAGNOSIS — G20 Parkinson's disease: Secondary | ICD-10-CM | POA: Diagnosis not present

## 2018-05-14 DIAGNOSIS — R296 Repeated falls: Secondary | ICD-10-CM | POA: Diagnosis not present

## 2018-05-14 DIAGNOSIS — R278 Other lack of coordination: Secondary | ICD-10-CM | POA: Diagnosis not present

## 2018-05-14 DIAGNOSIS — M6389 Disorders of muscle in diseases classified elsewhere, multiple sites: Secondary | ICD-10-CM | POA: Diagnosis not present

## 2018-05-15 DIAGNOSIS — G20 Parkinson's disease: Secondary | ICD-10-CM | POA: Diagnosis not present

## 2018-05-15 DIAGNOSIS — M6389 Disorders of muscle in diseases classified elsewhere, multiple sites: Secondary | ICD-10-CM | POA: Diagnosis not present

## 2018-05-15 DIAGNOSIS — R296 Repeated falls: Secondary | ICD-10-CM | POA: Diagnosis not present

## 2018-05-15 DIAGNOSIS — R278 Other lack of coordination: Secondary | ICD-10-CM | POA: Diagnosis not present

## 2018-05-16 DIAGNOSIS — R278 Other lack of coordination: Secondary | ICD-10-CM | POA: Diagnosis not present

## 2018-05-16 DIAGNOSIS — R296 Repeated falls: Secondary | ICD-10-CM | POA: Diagnosis not present

## 2018-05-16 DIAGNOSIS — G20 Parkinson's disease: Secondary | ICD-10-CM | POA: Diagnosis not present

## 2018-05-16 DIAGNOSIS — M6389 Disorders of muscle in diseases classified elsewhere, multiple sites: Secondary | ICD-10-CM | POA: Diagnosis not present

## 2018-05-18 DIAGNOSIS — M6389 Disorders of muscle in diseases classified elsewhere, multiple sites: Secondary | ICD-10-CM | POA: Diagnosis not present

## 2018-05-18 DIAGNOSIS — R296 Repeated falls: Secondary | ICD-10-CM | POA: Diagnosis not present

## 2018-05-18 DIAGNOSIS — R278 Other lack of coordination: Secondary | ICD-10-CM | POA: Diagnosis not present

## 2018-05-18 DIAGNOSIS — G20 Parkinson's disease: Secondary | ICD-10-CM | POA: Diagnosis not present

## 2018-05-19 DIAGNOSIS — M6389 Disorders of muscle in diseases classified elsewhere, multiple sites: Secondary | ICD-10-CM | POA: Diagnosis not present

## 2018-05-19 DIAGNOSIS — R278 Other lack of coordination: Secondary | ICD-10-CM | POA: Diagnosis not present

## 2018-05-19 DIAGNOSIS — G20 Parkinson's disease: Secondary | ICD-10-CM | POA: Diagnosis not present

## 2018-05-19 DIAGNOSIS — R296 Repeated falls: Secondary | ICD-10-CM | POA: Diagnosis not present

## 2018-05-20 DIAGNOSIS — R296 Repeated falls: Secondary | ICD-10-CM | POA: Diagnosis not present

## 2018-05-20 DIAGNOSIS — R278 Other lack of coordination: Secondary | ICD-10-CM | POA: Diagnosis not present

## 2018-05-20 DIAGNOSIS — G20 Parkinson's disease: Secondary | ICD-10-CM | POA: Diagnosis not present

## 2018-05-20 DIAGNOSIS — M6389 Disorders of muscle in diseases classified elsewhere, multiple sites: Secondary | ICD-10-CM | POA: Diagnosis not present

## 2018-05-21 DIAGNOSIS — R278 Other lack of coordination: Secondary | ICD-10-CM | POA: Diagnosis not present

## 2018-05-21 DIAGNOSIS — R296 Repeated falls: Secondary | ICD-10-CM | POA: Diagnosis not present

## 2018-05-21 DIAGNOSIS — G20 Parkinson's disease: Secondary | ICD-10-CM | POA: Diagnosis not present

## 2018-05-21 DIAGNOSIS — M6389 Disorders of muscle in diseases classified elsewhere, multiple sites: Secondary | ICD-10-CM | POA: Diagnosis not present

## 2018-05-22 DIAGNOSIS — M6389 Disorders of muscle in diseases classified elsewhere, multiple sites: Secondary | ICD-10-CM | POA: Diagnosis not present

## 2018-05-22 DIAGNOSIS — R296 Repeated falls: Secondary | ICD-10-CM | POA: Diagnosis not present

## 2018-05-22 DIAGNOSIS — R278 Other lack of coordination: Secondary | ICD-10-CM | POA: Diagnosis not present

## 2018-05-22 DIAGNOSIS — G20 Parkinson's disease: Secondary | ICD-10-CM | POA: Diagnosis not present

## 2018-05-25 DIAGNOSIS — M6389 Disorders of muscle in diseases classified elsewhere, multiple sites: Secondary | ICD-10-CM | POA: Diagnosis not present

## 2018-05-25 DIAGNOSIS — R278 Other lack of coordination: Secondary | ICD-10-CM | POA: Diagnosis not present

## 2018-05-25 DIAGNOSIS — R296 Repeated falls: Secondary | ICD-10-CM | POA: Diagnosis not present

## 2018-05-25 DIAGNOSIS — G20 Parkinson's disease: Secondary | ICD-10-CM | POA: Diagnosis not present

## 2018-05-26 DIAGNOSIS — R296 Repeated falls: Secondary | ICD-10-CM | POA: Diagnosis not present

## 2018-05-26 DIAGNOSIS — G20 Parkinson's disease: Secondary | ICD-10-CM | POA: Diagnosis not present

## 2018-05-26 DIAGNOSIS — M6389 Disorders of muscle in diseases classified elsewhere, multiple sites: Secondary | ICD-10-CM | POA: Diagnosis not present

## 2018-05-26 DIAGNOSIS — R278 Other lack of coordination: Secondary | ICD-10-CM | POA: Diagnosis not present

## 2018-05-27 DIAGNOSIS — R278 Other lack of coordination: Secondary | ICD-10-CM | POA: Diagnosis not present

## 2018-05-27 DIAGNOSIS — M6389 Disorders of muscle in diseases classified elsewhere, multiple sites: Secondary | ICD-10-CM | POA: Diagnosis not present

## 2018-05-27 DIAGNOSIS — G20 Parkinson's disease: Secondary | ICD-10-CM | POA: Diagnosis not present

## 2018-05-27 DIAGNOSIS — R296 Repeated falls: Secondary | ICD-10-CM | POA: Diagnosis not present

## 2018-05-28 DIAGNOSIS — R296 Repeated falls: Secondary | ICD-10-CM | POA: Diagnosis not present

## 2018-05-28 DIAGNOSIS — R278 Other lack of coordination: Secondary | ICD-10-CM | POA: Diagnosis not present

## 2018-05-28 DIAGNOSIS — M6389 Disorders of muscle in diseases classified elsewhere, multiple sites: Secondary | ICD-10-CM | POA: Diagnosis not present

## 2018-05-28 DIAGNOSIS — G20 Parkinson's disease: Secondary | ICD-10-CM | POA: Diagnosis not present

## 2018-06-02 DIAGNOSIS — R278 Other lack of coordination: Secondary | ICD-10-CM | POA: Diagnosis not present

## 2018-06-02 DIAGNOSIS — M6389 Disorders of muscle in diseases classified elsewhere, multiple sites: Secondary | ICD-10-CM | POA: Diagnosis not present

## 2018-06-02 DIAGNOSIS — R296 Repeated falls: Secondary | ICD-10-CM | POA: Diagnosis not present

## 2018-06-02 DIAGNOSIS — G20 Parkinson's disease: Secondary | ICD-10-CM | POA: Diagnosis not present

## 2018-06-04 ENCOUNTER — Non-Acute Institutional Stay (SKILLED_NURSING_FACILITY): Payer: PPO | Admitting: Adult Health

## 2018-06-04 DIAGNOSIS — K50918 Crohn's disease, unspecified, with other complication: Secondary | ICD-10-CM | POA: Diagnosis not present

## 2018-06-04 DIAGNOSIS — I2510 Atherosclerotic heart disease of native coronary artery without angina pectoris: Secondary | ICD-10-CM

## 2018-06-04 DIAGNOSIS — I10 Essential (primary) hypertension: Secondary | ICD-10-CM

## 2018-06-04 DIAGNOSIS — R269 Unspecified abnormalities of gait and mobility: Secondary | ICD-10-CM | POA: Diagnosis not present

## 2018-06-04 DIAGNOSIS — G20 Parkinson's disease: Secondary | ICD-10-CM | POA: Diagnosis not present

## 2018-06-04 DIAGNOSIS — F0280 Dementia in other diseases classified elsewhere without behavioral disturbance: Secondary | ICD-10-CM

## 2018-06-04 DIAGNOSIS — K219 Gastro-esophageal reflux disease without esophagitis: Secondary | ICD-10-CM | POA: Diagnosis not present

## 2018-06-09 ENCOUNTER — Encounter: Payer: Self-pay | Admitting: Adult Health

## 2018-06-09 NOTE — Progress Notes (Signed)
Location:  Occupational psychologist of Service:  SNF (31) Provider:   Cindi Carbon, ANP [place] [phone]   Gayland Curry, DO  Patient Care Team: Gayland Curry, DO as PCP - General (Geriatric Medicine) [name], [name], MD as Consulting Physician (Cardiology) [name], MD as Attending Physician (Gastroenterology) [name], MD as Consulting Physician (Neurology) Community, [facility] ([place]) Royal Hawthorn, NP as Nurse Practitioner (Nurse Practitioner)  Extended Emergency Contact Information Primary Emergency Contact: [name] Address: [place]          [place] Phone: [phone] Mobile Phone: [phone] Relation: Spouse  Code Status:  DNR Goals of care: Advanced Directive information Advanced Directives 02/24/2018  Does Patient Have a Medical Advance Directive? Yes  Type of Paramedic of [place];Living will;Out of facility DNR (pink MOST or yellow form)  Does patient want to make changes to medical advance directive? No - Patient declined  Copy of [place] in Chart? Yes - validated most recent copy scanned in chart (See row information)  Pre-existing out of facility DNR order (yellow form or pink MOST form) Yellow form placed in chart (order not valid for inpatient use)     Chief Complaint  Patient presents with  . Medical Management of Chronic Issues    HPI:  Pt is a 85 y.o. male seen today for medical management of chronic diseases.   He resides in the skilled care setting. He is incontinent and minimally ambulatory. He can transfer to the motorized wheelchair and is enjoying using it. He has underlying dementia but can feed himself and communicate his needs. MMSE 25/30 on 12/30/17 He has PD and orthostatic hypotensions. No episodes of syncope, rigidity, or tremor. Gaining weight as he is  eating more food. No issues with indigestion.  Crohns: period loose stool but no abd pain, fever, etc.  CAD: hx of stent, takes asa and lipitor  Past Medical History:  Diagnosis Date  . Cellulitis   . Chronic diastolic CHF (congestive heart failure) (HCC)    a. EF initially 35-40% after MI 1/09; b. echo 7/08: EF 60%;  c. 11/2014 Echo: EF 65-70%, Gr 1 DD, mild MR.  . Coronary artery disease    a. s/p anterior STEMI 04/2007 with BMS-> LAD;  b. Cath 12/2011 patent stent;  c. low risk nuc 04/2014.  . Crohn's disease ([place])   . Diaphragmatic hernia without mention of obstruction or gangrene   . Diverticulosis of colon (without mention of hemorrhage)   . Fatty liver    a. on ultrasound of 10/2009  . Flatulence, eructation, and gas pain   . Fungal infection   . Gait disorder   . GERD (gastroesophageal reflux disease)   . HOH (hard of hearing)    Hearing aids  . Hyperlipidemia   . Hypertension   . Ischemic cardiomyopathy    a. EF initially 35-40% after MI 1/09; b. echo 7/08: EF 60%;  c. 11/2014 Echo: EF 65-70%, Gr 1 DD, mild MR.  . Melanoma ([place])   . Memory disorder   . Orthostatic hypotension   . Osteoporosis   . Other chronic nonalcoholic liver disease   . Other esophagitis   . Parkinson's disease ([place])   . RBBB 09/02/2013  . Regional enteritis of small intestine ([place])   . Renal calculi   . Restless leg syndrome 03/22/2015  . Ulcerative (chronic) ileocolitis (  [place])   . Urinary incontinence    Past Surgical History:  Procedure Laterality Date  . APPENDECTOMY    . BACK SURGERY     L3, L4, L5  . CARDIAC CATHETERIZATION  09/25/06   EF 35-40% but more recently 60%  . CATARACT EXTRACTION     Bilateral  . CHOLECYSTECTOMY    . Coronary artery stent placement    . Melanoma resection    . Partial bowel resection    . TONSILLECTOMY      No Known Allergies  Outpatient Encounter Medications as of 06/04/2018  Medication Sig  . acetaminophen (TYLENOL) 325 MG tablet Take 650 mg by mouth  every 6 (six) hours as needed for pain.  Marland Kitchen ADVAIR DISKUS 250-50 MCG/DOSE AEPB Inhale 1 puff into the lungs daily. For shortness of breath  . aspirin EC 81 MG tablet Take 81 mg by mouth daily.  Marland Kitchen atorvastatin (LIPITOR) 40 MG tablet Take 40 mg by mouth daily.  . calcium carbonate (CALCIUM 600) 600 MG TABS tablet Take 600 mg by mouth daily with breakfast.  . carbidopa-levodopa (SINEMET CR) 50-200 MG tablet Take 1 tablet by mouth 4 (four) times daily.  . carbidopa-levodopa (SINEMET IR) 25-100 MG tablet 1/2 tablet with the 1st and 3rd doses of sinemet CR for 4 weeks, then take 1/2 tablet 4 times a day  . carvedilol (COREG) 3.125 MG tablet take one tablet ONLY if systolic BP greater than 700  . Cholecalciferol (VITAMIN D-3) 1000 UNITS CAPS Take 2,000 Units by mouth daily.   . cholestyramine (QUESTRAN) 4 g packet TAKE 1 PACKET BY MOUTH TWICE DAILY WITH A MEAL  . donepezil (ARICEPT) 10 MG tablet TAKE ONE TABLET AT BEDTIME  . ipratropium (ATROVENT) 0.03 % nasal spray Place 2 sprays into both nostrils 3 (three) times daily.  Marland Kitchen loperamide (IMODIUM A-D) 2 MG tablet Take 2 mg by mouth 2 (two) times daily as needed for diarrhea or loose stools.  . Melatonin 5 MG TABS Take 1 tablet by mouth at bedtime.   . midodrine (PROAMATINE) 5 MG tablet Take 1 tablet (5 mg total) by mouth 2 (two) times daily with a meal.  . Multiple Vitamins-Minerals (CENTRUM SILVER PO) Take 1 tablet by mouth daily.  . nitroGLYCERIN (NITROSTAT) 0.4 MG SL tablet Place 1 tablet (0.4 mg total) under the tongue every 5 (five) minutes as needed for chest pain. PT OVERDUE FOR OV PLEASE CALL FOR APPT  . pantoprazole (PROTONIX) 40 MG tablet Take 40 mg by mouth daily.   . potassium chloride SA (K-DUR,KLOR-CON) 20 MEQ tablet Take 40 mEq by mouth daily.  . Probiotic Product (ALIGN) 4 MG CAPS Take 1 capsule by mouth daily.   Marland Kitchen torsemide (DEMADEX) 20 MG tablet Take 1 tablet (20 mg total) by mouth daily.  . vitamin B-12 (CYANOCOBALAMIN) 1000 MCG tablet  Take 1,000 mcg by mouth daily.   No facility-administered encounter medications on file as of 06/04/2018.     Review of Systems  Constitutional: Negative for activity change, appetite change, chills, diaphoresis, fatigue, fever and unexpected weight change.  Respiratory: Negative for cough, shortness of breath, wheezing and stridor.   Cardiovascular: Negative for chest pain, palpitations and leg swelling.  Gastrointestinal: Positive for diarrhea. Negative for abdominal distention, abdominal pain and constipation.  Genitourinary: Negative for difficulty urinating and dysuria.  Musculoskeletal: Positive for arthralgias and gait problem. Negative for back pain, joint swelling and myalgias.  Neurological: Negative for dizziness, seizures, syncope, facial asymmetry, speech difficulty, weakness and headaches.  Hematological: Negative for adenopathy. Does not bruise/bleed easily.  Psychiatric/Behavioral: Positive for confusion. Negative for agitation and behavioral problems.    Immunization History  Administered Date(s) Administered  . Influenza-Unspecified 12/06/2014, 11/27/2017  . Pneumococcal Polysaccharide-23 05/21/2005  . Tdap 06/03/2006   Pertinent  Health Maintenance Due  Topic Date Due  . PNA vac Low Risk Adult (2 of 2 - PCV13) 05/21/2006  . INFLUENZA VACCINE  Completed   Fall Risk  05/12/2018 10/03/2017 12/29/2014  Falls in the past year? 1 No Yes  Number falls in past yr: 1 - 1  Injury with Fall? 0 - No  Risk for fall due to : Impaired mobility;History of fall(s);Impaired balance/gait;Mental status change;Medication side effect - Impaired balance/gait  Follow up Falls evaluation completed;Falls prevention discussed;Education provided - Falls evaluation completed   Functional Status Survey:    Vitals:   06/09/18 0754  Weight: 172 lb 12.8 oz (78.4 kg)   Body mass index is 26.27 kg/m. Physical Exam Vitals signs and nursing note reviewed.  Constitutional:      General: He is  not in acute distress.    Appearance: He is not diaphoretic.  HENT:     Head: Normocephalic and atraumatic.  Neck:     Musculoskeletal: Normal range of motion and neck supple.     Thyroid: No thyromegaly.     Vascular: No JVD.     Trachea: No tracheal deviation.  Cardiovascular:     Rate and Rhythm: Normal rate and regular rhythm.     Heart sounds: No murmur.  Pulmonary:     Effort: Pulmonary effort is normal. No respiratory distress.     Breath sounds: Normal breath sounds. No wheezing.  Abdominal:     General: Bowel sounds are normal. There is no distension.     Palpations: Abdomen is soft.     Tenderness: There is no abdominal tenderness.  Musculoskeletal:     Right lower leg: No edema.     Left lower leg: No edema.  Lymphadenopathy:     Cervical: No cervical adenopathy.  Skin:    General: Skin is warm and dry.  Neurological:     Mental Status: He is alert.     Cranial Nerves: No cranial nerve deficit.     Comments: Oriented x2  Psychiatric:        Mood and Affect: Mood normal.     Labs reviewed: Recent Labs    12/19/17 0518 12/20/17 0522 02/09/18 1251  NA 137 139 139  K 3.4* 3.5 3.6  CL 100 99 97  CO2 22 28 31   GLUCOSE 116* 117* 127*  BUN 18 21 26*  CREATININE 1.03 1.06 1.26  CALCIUM 8.3* 8.7* 8.6  MG 1.1* 2.0  --   PHOS  --  3.2  --    Recent Labs    09/02/17 1705 12/17/17 2044 12/18/17 0533  AST 27 28 21   ALT 14* 7 13  ALKPHOS 70 83 65  BILITOT 1.8* 2.3* 1.8*  PROT 7.8 8.6* 6.7  ALBUMIN 4.1 4.3 3.3*   Recent Labs    09/02/17 1705 12/17/17 2044  12/19/17 0518 12/20/17 0522 02/09/18 1251  WBC 8.8 18.5*   < > 12.0* 9.9 9.6  NEUTROABS 5.3 16.7*  --   --   --   --   HGB 12.4* 13.1   < > 12.1* 12.3* 14.1  HCT 36.8* 39.7   < > 36.5* 37.6* 41.8  MCV 93.6 94.7   < > 95.3 94.7 92.7  PLT 220 232   < > 201 226 304.0   < > = values in this interval not displayed.   Lab Results  Component Value Date   TSH 2.428 11/12/2014   No results found  for: HGBA1C No results found for: CHOL, HDL, LDLCALC, LDLDIRECT, TRIG, CHOLHDL  Significant Diagnostic Results in last 30 days:  No results found.  Assessment/Plan 1. Essential hypertension Most readings are WNL Continue Torsemide 20 mg qd and Coreg prn if SBP >110 (due to hx of hypotension).  2. Crohn's disease with other complication, unspecified gastrointestinal tract location ([place]) No exacerbations, occasional loose stools Continue Align and [place]  3. Gastroesophageal reflux disease, esophagitis presence not specified No issues Continue protonix 40 mg qd, would not taper given history of erosive esophagitis and Crohns  4. Dementia due to Parkinson's disease without behavioral disturbance ([place]) Needs assistance and cuing but overall doing quite well on Sinemet and Aricept  5. Abnormality of gait Working with OT for power wheel chair use.   6. Coronary artery disease involving native coronary artery of native heart without angina pectoris Continue lipitor 40 mg qd Continue aspirin 81 mg qd    Family/ staff Communication: resident and staff  Labs/tests ordered:  NA

## 2018-06-29 ENCOUNTER — Non-Acute Institutional Stay (SKILLED_NURSING_FACILITY): Payer: PPO | Admitting: Adult Health

## 2018-06-29 ENCOUNTER — Encounter: Payer: Self-pay | Admitting: Adult Health

## 2018-06-29 DIAGNOSIS — I951 Orthostatic hypotension: Secondary | ICD-10-CM

## 2018-06-29 DIAGNOSIS — G20 Parkinson's disease: Secondary | ICD-10-CM

## 2018-06-29 DIAGNOSIS — K50918 Crohn's disease, unspecified, with other complication: Secondary | ICD-10-CM | POA: Diagnosis not present

## 2018-06-29 DIAGNOSIS — F0280 Dementia in other diseases classified elsewhere without behavioral disturbance: Secondary | ICD-10-CM | POA: Diagnosis not present

## 2018-06-29 DIAGNOSIS — I10 Essential (primary) hypertension: Secondary | ICD-10-CM | POA: Diagnosis not present

## 2018-06-29 DIAGNOSIS — I5032 Chronic diastolic (congestive) heart failure: Secondary | ICD-10-CM | POA: Diagnosis not present

## 2018-07-03 ENCOUNTER — Encounter: Payer: Self-pay | Admitting: Adult Health

## 2018-07-03 NOTE — Progress Notes (Signed)
Location:  Occupational psychologist of Service:  SNF (31) Provider:   Cindi Carbon, ANP [place] [phone]   Gayland Curry, DO  Patient Care Team: Gayland Curry, DO as PCP - General (Geriatric Medicine) [name], [name], MD as Consulting Physician (Cardiology) [name], MD as Attending Physician (Gastroenterology) [name], MD as Consulting Physician (Neurology) Community, [facility] ([place]) Royal Hawthorn, NP as Nurse Practitioner (Nurse Practitioner)  Extended Emergency Contact Information Primary Emergency Contact: [name] Address: [place]          [place] Phone: [phone] Mobile Phone: [phone] Relation: Spouse  Code Status: DNR Goals of care: Advanced Directive information Advanced Directives 02/24/2018  Does Patient Have a Medical Advance Directive? Yes  Type of Paramedic of [place];Living will;Out of facility DNR (pink MOST or yellow form)  Does patient want to make changes to medical advance directive? No - Patient declined  Copy of [place] in Chart? Yes - validated most recent copy scanned in chart (See row information)  Pre-existing out of facility DNR order (yellow form or pink MOST form) Yellow form placed in chart (order not valid for inpatient use)     Chief Complaint  Patient presents with  . Medical Management of Chronic Issues    HPI:  Pt is a 85 y.o. male seen today for medical management of chronic diseases.    Diastolic CHF: no sob, increased edema, cp, doe.  Weight stable Wt Readings from Last 3 Encounters:  06/29/18 172 lb (78 kg)  06/09/18 172 lb 12.8 oz (78.4 kg)  05/12/18 172 lb (78 kg)   Dementia: MMSE 25/30 on 12/30/17  No episodes of orthostatic hypotension BP controlled  Crohns: no reported issues of explosive diarrhea. Previously he has been on  multiple agents including remicade which was stopped due to fungal infection. S/p colon resection as well.   Functional status: minimally ambulatory, can pivot transfer, uses motorized scooter for long distances. Incontinent of bowel and bladder.  Past Medical History:  Diagnosis Date  . Cellulitis   . Chronic diastolic CHF (congestive heart failure) (HCC)    a. EF initially 35-40% after MI 1/09; b. echo 7/08: EF 60%;  c. 11/2014 Echo: EF 65-70%, Gr 1 DD, mild MR.  . Coronary artery disease    a. s/p anterior STEMI 04/2007 with BMS-> LAD;  b. Cath 12/2011 patent stent;  c. low risk nuc 04/2014.  . Crohn's disease ([place])   . Diaphragmatic hernia without mention of obstruction or gangrene   . Diverticulosis of colon (without mention of hemorrhage)   . Fatty liver    a. on ultrasound of 10/2009  . Flatulence, eructation, and gas pain   . Fungal infection   . Gait disorder   . GERD (gastroesophageal reflux disease)   . HOH (hard of hearing)    Hearing aids  . Hyperlipidemia   . Hypertension   . Ischemic cardiomyopathy    a. EF initially 35-40% after MI 1/09; b. echo 7/08: EF 60%;  c. 11/2014 Echo: EF 65-70%, Gr 1 DD, mild MR.  . Melanoma ([place])   . Memory disorder   . Orthostatic hypotension   . Osteoporosis   . Other chronic nonalcoholic liver disease   . Other esophagitis   . Parkinson's disease ([place])   . RBBB 09/02/2013  . Regional enteritis of small intestine ([place])   .  Renal calculi   . Restless leg syndrome 03/22/2015  . Ulcerative (chronic) ileocolitis ([place])   . Urinary incontinence    Past Surgical History:  Procedure Laterality Date  . APPENDECTOMY    . BACK SURGERY     L3, L4, L5  . CARDIAC CATHETERIZATION  09/25/06   EF 35-40% but more recently 60%  . CATARACT EXTRACTION     Bilateral  . CHOLECYSTECTOMY    . Coronary artery stent placement    . Melanoma resection    . Partial bowel resection    . TONSILLECTOMY      No Known Allergies  Outpatient Encounter  Medications as of 06/29/2018  Medication Sig  . acetaminophen (TYLENOL) 325 MG tablet Take 650 mg by mouth at bedtime. And q 6 hrs prn  . ADVAIR DISKUS 250-50 MCG/DOSE AEPB Inhale 1 puff into the lungs daily. For shortness of breath  . aspirin EC 81 MG tablet Take 81 mg by mouth daily.  Marland Kitchen atorvastatin (LIPITOR) 40 MG tablet Take 40 mg by mouth daily.  . calcium carbonate (CALCIUM 600) 600 MG TABS tablet Take 600 mg by mouth daily with breakfast.  . carbidopa-levodopa (SINEMET CR) 50-200 MG tablet Take 1 tablet by mouth 4 (four) times daily.  . carbidopa-levodopa (SINEMET IR) 25-100 MG tablet 1/2 tablet with the 1st and 3rd doses of sinemet CR for 4 weeks, then take 1/2 tablet 4 times a day  . carvedilol (COREG) 3.125 MG tablet take one tablet ONLY if systolic BP greater than 409  . Cholecalciferol (VITAMIN D-3) 1000 UNITS CAPS Take 2,000 Units by mouth daily.   . cholestyramine (QUESTRAN) 4 g packet TAKE 1 PACKET BY MOUTH TWICE DAILY WITH A MEAL  . donepezil (ARICEPT) 10 MG tablet TAKE ONE TABLET AT BEDTIME  . ipratropium (ATROVENT) 0.03 % nasal spray Place 2 sprays into both nostrils 3 (three) times daily.  Marland Kitchen loperamide (IMODIUM A-D) 2 MG tablet Take 2 mg by mouth 2 (two) times daily as needed for diarrhea or loose stools.  . Melatonin 5 MG TABS Take 1 tablet by mouth at bedtime.   . midodrine (PROAMATINE) 5 MG tablet Take 1 tablet (5 mg total) by mouth 2 (two) times daily with a meal.  . Multiple Vitamins-Minerals (CENTRUM SILVER PO) Take 1 tablet by mouth daily.  . nitroGLYCERIN (NITROSTAT) 0.4 MG SL tablet Place 1 tablet (0.4 mg total) under the tongue every 5 (five) minutes as needed for chest pain. PT OVERDUE FOR OV PLEASE CALL FOR APPT  . pantoprazole (PROTONIX) 40 MG tablet Take 40 mg by mouth daily.   . potassium chloride SA (K-DUR,KLOR-CON) 20 MEQ tablet Take 40 mEq by mouth daily.  . Probiotic Product (ALIGN) 4 MG CAPS Take 1 capsule by mouth daily.   Marland Kitchen torsemide (DEMADEX) 20 MG  tablet Take 1 tablet (20 mg total) by mouth daily.  . vitamin B-12 (CYANOCOBALAMIN) 1000 MCG tablet Take 1,000 mcg by mouth daily.   No facility-administered encounter medications on file as of 06/29/2018.     Review of Systems  Constitutional: Negative for activity change, appetite change, chills, diaphoresis, fatigue, fever and unexpected weight change.  Respiratory: Negative for cough, shortness of breath, wheezing and stridor.   Cardiovascular: Negative for chest pain, palpitations and leg swelling.  Gastrointestinal: Negative for abdominal distention, abdominal pain, constipation and diarrhea.  Genitourinary: Negative for difficulty urinating and dysuria.  Musculoskeletal: Positive for gait problem. Negative for arthralgias, back pain, joint swelling and myalgias.  Skin: Negative for  wound.  Neurological: Positive for weakness. Negative for dizziness, tremors, seizures, syncope, facial asymmetry, speech difficulty and headaches.  Hematological: Negative for adenopathy. Does not bruise/bleed easily.  Psychiatric/Behavioral: Positive for confusion. Negative for agitation and behavioral problems.    Immunization History  Administered Date(s) Administered  . Influenza-Unspecified 12/06/2014, 11/27/2017  . Pneumococcal Polysaccharide-23 05/21/2005  . Tdap 06/03/2006   Pertinent  Health Maintenance Due  Topic Date Due  . PNA vac Low Risk Adult (2 of 2 - PCV13) 05/21/2006  . INFLUENZA VACCINE  Completed   Fall Risk  05/12/2018 10/03/2017 12/29/2014  Falls in the past year? 1 No Yes  Number falls in past yr: 1 - 1  Injury with Fall? 0 - No  Risk for fall due to : Impaired mobility;History of fall(s);Impaired balance/gait;Mental status change;Medication side effect - Impaired balance/gait  Follow up Falls evaluation completed;Falls prevention discussed;Education provided - Falls evaluation completed   Functional Status Survey:    Vitals:   06/29/18 1412  BP: 130/73  Pulse: 78   Resp: 19  Temp: (!) 97.4 F (36.3 C)  SpO2: 96%  Weight: 172 lb (78 kg)   Body mass index is 26.15 kg/m. Physical Exam Vitals signs and nursing note reviewed.  Constitutional:      General: He is not in acute distress.    Appearance: He is not diaphoretic.  HENT:     Head: Normocephalic and atraumatic.  Neck:     Thyroid: No thyromegaly.     Vascular: No JVD.     Trachea: No tracheal deviation.  Cardiovascular:     Heart sounds: No murmur.  Pulmonary:     Effort: Pulmonary effort is normal. No respiratory distress.  Abdominal:     General: There is no distension.     Comments: Rotund abd  Lymphadenopathy:     Cervical: No cervical adenopathy.  Skin:    General: Skin is warm and dry.  Neurological:     General: No focal deficit present.     Mental Status: He is alert. Mental status is at baseline.     Comments: Oriented x2, unsteady gait  Psychiatric:        Mood and Affect: Mood normal.     Labs reviewed: Recent Labs    12/19/17 0518 12/20/17 0522 02/09/18 02/09/18 1251  NA 137 139 139 139  K 3.4* 3.5 3.6 3.6  CL 100 99  --  97  CO2 22 28  --  31  GLUCOSE 116* 117*  --  127*  BUN 18 21 26* 26*  CREATININE 1.03 1.06 1.3 1.26  CALCIUM 8.3* 8.7*  --  8.6  MG 1.1* 2.0  --   --   PHOS  --  3.2  --   --    Recent Labs    09/02/17 1705 12/17/17 2044 12/18/17 0533  AST 27 28 21   ALT 14* 7 13  ALKPHOS 70 83 65  BILITOT 1.8* 2.3* 1.8*  PROT 7.8 8.6* 6.7  ALBUMIN 4.1 4.3 3.3*   Recent Labs    09/02/17 1705 12/17/17 2044  12/19/17 0518 12/20/17 0522 02/09/18 02/09/18 1251  WBC 8.8 18.5*   < > 12.0* 9.9 9.6 9.6  NEUTROABS 5.3 16.7*  --   --   --   --   --   HGB 12.4* 13.1   < > 12.1* 12.3*  --  14.1  HCT 36.8* 39.7   < > 36.5* 37.6*  --  41.8  MCV 93.6 94.7   < >  95.3 94.7  --  92.7  PLT 220 232   < > 201 226  --  304.0   < > = values in this interval not displayed.   Lab Results  Component Value Date   TSH 2.428 11/12/2014   No results found  for: HGBA1C No results found for: CHOL, HDL, LDLCALC, LDLDIRECT, TRIG, CHOLHDL  Significant Diagnostic Results in last 30 days:  No results found.  Assessment/Plan 1. Chronic diastolic CHF (congestive heart failure) (HCC) EF 65-70% with grade 1 DD by echo Aug 2016 Compensated, continue current dose of torsemide and weight monitoring.   2. Essential hypertension Controlled without meds  3. Dementia due to Parkinson's disease without behavioral disturbance (HCC) Moderate stage, continue aricept.   4. Parkinson disease ([place]) Followed by Dr. Jannifer Franklin, due for visit in 1-2 months. Continue current dose of sinemet  5. Crohn's disease with other complication, unspecified gastrointestinal tract location ([place]) Controlled, followed by GI. Uses align and prn immodium  6. Orthostatic hypotension No new event,s continue current dose of midodrine.     Family/ staff Communication: resident/staff  Labs/tests ordered:  NA

## 2018-07-28 ENCOUNTER — Non-Acute Institutional Stay (SKILLED_NURSING_FACILITY): Payer: PPO | Admitting: Internal Medicine

## 2018-07-28 ENCOUNTER — Encounter: Payer: Self-pay | Admitting: Internal Medicine

## 2018-07-28 DIAGNOSIS — G20 Parkinson's disease: Secondary | ICD-10-CM

## 2018-07-28 DIAGNOSIS — F0280 Dementia in other diseases classified elsewhere without behavioral disturbance: Secondary | ICD-10-CM

## 2018-07-28 DIAGNOSIS — I10 Essential (primary) hypertension: Secondary | ICD-10-CM | POA: Diagnosis not present

## 2018-07-28 DIAGNOSIS — I951 Orthostatic hypotension: Secondary | ICD-10-CM

## 2018-07-28 DIAGNOSIS — I5032 Chronic diastolic (congestive) heart failure: Secondary | ICD-10-CM

## 2018-07-28 DIAGNOSIS — K50918 Crohn's disease, unspecified, with other complication: Secondary | ICD-10-CM | POA: Diagnosis not present

## 2018-07-28 DIAGNOSIS — G20A1 Parkinson's disease without dyskinesia, without mention of fluctuations: Secondary | ICD-10-CM

## 2018-07-28 NOTE — Progress Notes (Signed)
[name], male   DOB: 02/06/1929, 85 y.o.   MRN: [id number]  Location:  [facility] Room Number: 003 Place of Service:  SNF ([id number]) Provider:   Gayland Curry, DO  Patient Care Team: Gayland Curry, DO as PCP - General (Geriatric Medicine) [name], [name], MD as Consulting Physician (Cardiology) [name], MD as Attending Physician (Gastroenterology) [name], MD as Consulting Physician (Neurology) Community, [facility] ([place]) Royal Hawthorn, NP as Nurse Practitioner (Nurse Practitioner)  Extended Emergency Contact Information Primary Emergency Contact: [name] Address: [place]          [name] Phone: [phone] Mobile Phone: [phone] Relation: Spouse  Code Status: DNR Goals of care: Advanced Directive information Advanced Directives 02/24/2018  Does Patient Have a Medical Advance Directive? Yes  Type of Paramedic of [place];Living will;Out of facility DNR (pink MOST or yellow form)  Does patient want to make changes to medical advance directive? No - Patient declined  Copy of Veteran in Chart? Yes - validated most recent copy scanned in chart (See row information)  Pre-existing out of facility DNR order (yellow form or pink MOST form) Yellow form placed in chart (order not valid for inpatient use)     Chief Complaint  Patient presents with  . Medical Management of Chronic Issues    Routine Visit    HPI:  Pt is a 85 y.o. male seen today for medical management of chronic diseases.    Nursing reports only that he had a low bp last night of 99/65 but typically it's within the normal range.  He denies having had symptoms of dizziness related to that or current dizziness.  He has some loose stools occasionally, but not persistent.   Overall, he feels pretty good.  He has a great  appetite.  He was sitting in his room watching golf. Weight stable at 172. No falls reported.   Still just occasional neck pain relieved with tylenol.  Past Medical History:  Diagnosis Date  . Cellulitis   . Chronic diastolic CHF (congestive heart failure) (HCC)    a. EF initially 35-40% after MI 1/09; b. echo 7/08: EF 60%;  c. 11/2014 Echo: EF 65-70%, Gr 1 DD, mild MR.  . Coronary artery disease    a. s/p anterior STEMI 04/2007 with BMS-> LAD;  b. Cath 12/2011 patent stent;  c. low risk nuc 04/2014.  . Crohn's disease ([place])   . Diaphragmatic hernia without mention of obstruction or gangrene   . Diverticulosis of colon (without mention of hemorrhage)   . Fatty liver    a. on ultrasound of 10/2009  . Flatulence, eructation, and gas pain   . Fungal infection   . Gait disorder   . GERD (gastroesophageal reflux disease)   . HOH (hard of hearing)    Hearing aids  . Hyperlipidemia   . Hypertension   . Ischemic cardiomyopathy    a. EF initially 35-40% after MI 1/09; b. echo 7/08: EF 60%;  c. 11/2014 Echo: EF 65-70%, Gr 1 DD, mild MR.  . Melanoma ([place])   . Memory disorder   . Orthostatic hypotension   . Osteoporosis   . Other chronic nonalcoholic liver disease   . Other esophagitis   . Parkinson's disease ([place])   . RBBB 09/02/2013  . Regional enteritis of small intestine ([place])   . Renal calculi   .  Restless leg syndrome 03/22/2015  . 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 Urinary incontinence    Past Surgical History:  Procedure Laterality Date  . APPENDECTOMY    . BACK SURGERY     L3, L4, L5  . CARDIAC CATHETERIZATION  09/25/06   EF 35-40% but more recently 60%  . CATARACT EXTRACTION     Bilateral  . CHOLECYSTECTOMY    . Coronary artery stent placement    . Melanoma resection    . Partial bowel resection    . TONSILLECTOMY      No Known Allergies  Outpatient Encounter Medications as of 07/28/2018  Medication Sig  . acetaminophen (TYLENOL) 325 MG tablet Take 650 mg by  mouth at bedtime. And q 6 hrs prn  . ADVAIR DISKUS 250-50 MCG/DOSE AEPB Inhale 1 puff into the lungs daily. For shortness of breath  . aspirin EC 81 MG tablet Take 81 mg by mouth daily.  Marland Kitchen atorvastatin (LIPITOR) 40 MG tablet Take 40 mg by mouth daily.  . calcium carbonate (CALCIUM 600) 600 MG TABS tablet Take 600 mg by mouth daily with breakfast.  . carbidopa-levodopa (SINEMET CR) 50-200 MG tablet Take 1 tablet by mouth 4 (four) times daily.  . Carbidopa-Levodopa ER (SINEMET CR) 25-100 MG tablet controlled release Take 0.5 tablets by mouth 4 (four) times daily.  . carvedilol (COREG) 3.125 MG tablet take one tablet ONLY if systolic BP greater than 859  . Cholecalciferol (VITAMIN D-3) 1000 UNITS CAPS Take 2,000 Units by mouth daily.   . cholestyramine (QUESTRAN) 4 g packet TAKE 1 PACKET BY MOUTH TWICE DAILY WITH A MEAL  . donepezil (ARICEPT) 10 MG tablet TAKE ONE TABLET AT BEDTIME  . ipratropium (ATROVENT) 0.03 % nasal spray Place 2 sprays into both nostrils 3 (three) times daily.  Marland Kitchen loperamide (IMODIUM A-D) 2 MG tablet Take 2 mg by mouth 2 (two) times daily as needed for diarrhea or loose stools.  . Melatonin 5 MG TABS Take 1 tablet by mouth at bedtime.   . midodrine (PROAMATINE) 5 MG tablet Take 1 tablet (5 mg total) by mouth 2 (two) times daily with a meal.  . Multiple Vitamins-Minerals (CENTRUM SILVER PO) Take 1 tablet by mouth daily.  . nitroGLYCERIN (NITROSTAT) 0.4 MG SL tablet Place 1 tablet (0.4 mg total) under the tongue every 5 (five) minutes as needed for chest pain. PT OVERDUE FOR OV PLEASE CALL FOR APPT  . pantoprazole (PROTONIX) 40 MG tablet Take 40 mg by mouth daily.   . potassium chloride SA (K-DUR,KLOR-CON) 20 MEQ tablet Take 40 mEq by mouth daily.  . Probiotic Product (ALIGN) 4 MG CAPS Take 1 capsule by mouth daily.   . sodium chloride (OCEAN) 0.65 % SOLN nasal spray Place 1 spray into both nostrils daily as needed for congestion.  . torsemide (DEMADEX) 20 MG tablet Take 1 tablet  (20 mg total) by mouth daily.  . vitamin B-12 (CYANOCOBALAMIN) 1000 MCG tablet Take 1,000 mcg by mouth daily.  . [DISCONTINUED] carbidopa-levodopa (SINEMET IR) 25-100 MG tablet 1/2 tablet with the 1st and 3rd doses of sinemet CR for 4 weeks, then take 1/2 tablet 4 times a day   No facility-administered encounter medications on file as of 07/28/2018.     Review of Systems  Constitutional: Negative for activity change, appetite change, chills, fatigue and fever.  HENT: Positive for rhinorrhea. Negative for congestion.   Eyes: Negative for visual disturbance.  Respiratory: Negative for cough, choking, chest tightness and shortness of breath.  Cardiovascular: Negative for chest pain, palpitations and leg swelling.  Gastrointestinal: Positive for diarrhea. Negative for abdominal pain, blood in stool, constipation, nausea and vomiting.  Genitourinary: Negative for dysuria.  Musculoskeletal: Positive for gait problem and neck pain.  Skin: Negative for color change.  Allergic/Immunologic: Positive for immunocompromised state.  Neurological: Negative for dizziness, weakness and light-headedness.  Hematological: Bruises/bleeds easily.  Psychiatric/Behavioral: Negative for confusion and sleep disturbance. The patient is not nervous/anxious.        Some short-term memory loss, but stable it seems b/w recent visits    Immunization History  Administered Date(s) Administered  . Influenza-Unspecified 12/06/2014, 11/27/2017  . Pneumococcal Conjugate-13 05/22/2018  . Pneumococcal Polysaccharide-23 05/21/2005  . Tdap 06/03/2006   Pertinent  Health Maintenance Due  Topic Date Due  . PNA vac Low Risk Adult (2 of 2 - PCV13) 05/21/2006  . INFLUENZA VACCINE  11/07/2018   Fall Risk  05/12/2018 10/03/2017 12/29/2014  Falls in the past year? 1 No Yes  Number falls in past yr: 1 - 1  Injury with Fall? 0 - No  Risk for fall due to : Impaired mobility;History of fall(s);Impaired balance/gait;Mental status  change;Medication side effect - Impaired balance/gait  Follow up Falls evaluation completed;Falls prevention discussed;Education provided - Falls evaluation completed   Functional Status Survey:    Vitals:   07/28/18 1048  BP: 130/82  Pulse: 85  Resp: 16  Temp: (!) 97.3 F (36.3 C)  TempSrc: Oral  SpO2: 98%  Weight: 172 lb (78 kg)  Height: 5' 8"  (1.727 m)   Body mass index is 26.15 kg/m. Physical Exam Vitals signs and nursing note reviewed.  Constitutional:      General: He is not in acute distress.    Appearance: Normal appearance. He is normal weight. He is not toxic-appearing.  HENT:     Head: Normocephalic and atraumatic.     Nose: Rhinorrhea present.  Cardiovascular:     Pulses: Normal pulses.  Pulmonary:     Effort: Pulmonary effort is normal.  Abdominal:     General: There is distension.  Musculoskeletal: Normal range of motion.     Comments: Seated in recliner chair with majority of lunch finished (requesting to have it removed)  Skin:    General: Skin is warm and dry.     Comments: Some chronic flushing of cheeks  Neurological:     Mental Status: He is alert and oriented to person, place, and time. Mental status is at baseline.     Gait: Gait abnormal.     Comments: Shuffling gait     Labs reviewed: Recent Labs    12/19/17 0518 12/20/17 0522 02/09/18 02/09/18 1251  NA 137 139 139 139  K 3.4* 3.5 3.6 3.6  CL 100 99  --  97  CO2 22 28  --  31  GLUCOSE 116* 117*  --  127*  BUN 18 21 26* 26*  CREATININE 1.03 1.06 1.3 1.26  CALCIUM 8.3* 8.7*  --  8.6  MG 1.1* 2.0  --   --   PHOS  --  3.2  --   --    Recent Labs    09/02/17 1705 12/17/17 2044 12/18/17 0533  AST 27 28 21   ALT 14* 7 13  ALKPHOS 70 83 65  BILITOT 1.8* 2.3* 1.8*  PROT 7.8 8.6* 6.7  ALBUMIN 4.1 4.3 3.3*   Recent Labs    09/02/17 1705 12/17/17 2044  12/19/17 0518 12/20/17 0522 02/09/18 02/09/18 1251  WBC 8.8  18.5*   < > 12.0* 9.9 9.6 9.6  NEUTROABS 5.3 16.7*  --   --    --   --   --   HGB 12.4* 13.1   < > 12.1* 12.3*  --  14.1  HCT 36.8* 39.7   < > 36.5* 37.6*  --  41.8  MCV 93.6 94.7   < > 95.3 94.7  --  92.7  PLT 220 232   < > 201 226  --  304.0   < > = values in this interval not displayed.   Lab Results  Component Value Date   TSH 2.428 11/12/2014   No results found for: HGBA1C No results found for: CHOL, HDL, LDLCALC, LDLDIRECT, TRIG, CHOLHDL  Significant Diagnostic Results in last 30 days:  No results found.  Assessment/Plan 1. Parkinson disease ([place]) -no major changes since moving to SNF -continues on sinemet regimen per neurology  2. Dementia due to Parkinson's disease without behavioral disturbance ([place]) -seems to be stable since moving to snf, remains on aricept--monitor HR  3. Essential hypertension -historical -bp typically good, had one low one last night (has some neurogenic orthostasis), but does not report any symptoms of dizziness this time -cont meds for his chf only (coreg and torsemide) -on midodrine for orthostasis  4. Chronic diastolic CHF (congestive heart failure) (HCC) -stable on torsemide and coreg, not on ace or arb due to orthostasis  5. Crohn's disease with other complication, unspecified gastrointestinal tract location [facility]) -some loose stools, but not having persistent episodes, has prn imodium for this, align probiotic And cholestyramine  6. Orthostatic hypotension -cont on midodrine, no recent reported symptoms and just one low bp  Family/ staff Communication: discussed with snf nurse  Labs/tests ordered:  No new  Emanuelle Bastos L. Breane Grunwald, D.O. [facility] [address], [place] [address] Cell Phone (Mon-Fri 8am-5pm):  [phone] On Call:  [phone] & follow prompts after 5pm & weekends Office Phone:  [phone] Office Fax:  [phone]

## 2018-08-20 ENCOUNTER — Non-Acute Institutional Stay (SKILLED_NURSING_FACILITY): Payer: PPO | Admitting: Adult Health

## 2018-08-20 ENCOUNTER — Encounter: Payer: Self-pay | Admitting: Adult Health

## 2018-08-20 ENCOUNTER — Other Ambulatory Visit: Payer: Self-pay

## 2018-08-20 DIAGNOSIS — F0280 Dementia in other diseases classified elsewhere without behavioral disturbance: Secondary | ICD-10-CM

## 2018-08-20 DIAGNOSIS — K7581 Nonalcoholic steatohepatitis (NASH): Secondary | ICD-10-CM

## 2018-08-20 DIAGNOSIS — G20 Parkinson's disease: Secondary | ICD-10-CM | POA: Diagnosis not present

## 2018-08-20 DIAGNOSIS — I951 Orthostatic hypotension: Secondary | ICD-10-CM

## 2018-08-20 DIAGNOSIS — K50918 Crohn's disease, unspecified, with other complication: Secondary | ICD-10-CM | POA: Diagnosis not present

## 2018-08-20 DIAGNOSIS — I5032 Chronic diastolic (congestive) heart failure: Secondary | ICD-10-CM | POA: Diagnosis not present

## 2018-08-20 DIAGNOSIS — K219 Gastro-esophageal reflux disease without esophagitis: Secondary | ICD-10-CM

## 2018-08-20 DIAGNOSIS — I10 Essential (primary) hypertension: Secondary | ICD-10-CM | POA: Diagnosis not present

## 2018-08-20 NOTE — Progress Notes (Signed)
Location:  Occupational psychologist of Service:  SNF (31) Provider:   Cindi Carbon, ANP [place] [phone]   Gayland Curry, DO  Patient Care Team: Gayland Curry, DO as PCP - General (Geriatric Medicine) [name], [name], MD as Consulting Physician (Cardiology) [name], MD as Attending Physician (Gastroenterology) [name], MD as Consulting Physician (Neurology) Community, [facility] ([place]) Royal Hawthorn, NP as Nurse Practitioner (Nurse Practitioner)  Extended Emergency Contact Information Primary Emergency Contact: [name] Address: [address]          [place] Phone: [phone] Mobile Phone: [phone] Relation: Spouse  Code Status:  DNR Goals of care: Advanced Directive information Advanced Directives 02/24/2018  Does Patient Have a Medical Advance Directive? Yes  Type of Paramedic of [place];Living will;Out of facility DNR (pink MOST or yellow form)  Does patient want to make changes to medical advance directive? No - Patient declined  Copy of [place] in Chart? Yes - validated most recent copy scanned in chart (See row information)  Pre-existing out of facility DNR order (yellow form or pink MOST form) Yellow form placed in chart (order not valid for inpatient use)     Chief Complaint  Patient presents with  . Medical Management of Chronic Issues    HPI:  Pt is a 85 y.o. male seen today for medical management of chronic diseases.    He has not had any issues with increased edema, weight gain, sob, or doe. Takes torsemide daily for CHF.  Crohn's dz: taking questran daily with no abd pain or diarrhea  PD: no issues with tremor, rigidity No issues with low bp or syncope  Denies reflux, burning, difficulty swallowing.   He has dementia associated with PD abut is able to operate a  motorized scooter and communicate his needs well. He is incontinent and needs help with dressing and bathing.    Past Medical History:  Diagnosis Date  . Cellulitis   . Chronic diastolic CHF (congestive heart failure) (HCC)    a. EF initially 35-40% after MI 1/09; b. echo 7/08: EF 60%;  c. 11/2014 Echo: EF 65-70%, Gr 1 DD, mild MR.  . Coronary artery disease    a. s/p anterior STEMI 04/2007 with BMS-> LAD;  b. Cath 12/2011 patent stent;  c. low risk nuc 04/2014.  . Crohn's disease ([place])   . Diaphragmatic hernia without mention of obstruction or gangrene   . Diverticulosis of colon (without mention of hemorrhage)   . Fatty liver    a. on ultrasound of 10/2009  . Flatulence, eructation, and gas pain   . Fungal infection   . Gait disorder   . GERD (gastroesophageal reflux disease)   . HOH (hard of hearing)    Hearing aids  . Hyperlipidemia   . Hypertension   . Ischemic cardiomyopathy    a. EF initially 35-40% after MI 1/09; b. echo 7/08: EF 60%;  c. 11/2014 Echo: EF 65-70%, Gr 1 DD, mild MR.  . Melanoma ([place])   . Memory disorder   . Orthostatic hypotension   . Osteoporosis   . Other chronic nonalcoholic liver disease   . Other esophagitis   . Parkinson's disease ([place])   . RBBB 09/02/2013  . Regional enteritis of small intestine ([place])   . Renal calculi   . Restless leg syndrome 03/22/2015  . 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  Urinary incontinence    Past Surgical History:  Procedure Laterality Date  . APPENDECTOMY    . BACK SURGERY     L3, L4, L5  . CARDIAC CATHETERIZATION  09/25/06   EF 35-40% but more recently 60%  . CATARACT EXTRACTION     Bilateral  . CHOLECYSTECTOMY    . Coronary artery stent placement    . Melanoma resection    . Partial bowel resection    . TONSILLECTOMY      No Known Allergies  Outpatient Encounter Medications as of 08/20/2018  Medication Sig  . acetaminophen (TYLENOL) 325 MG tablet Take 650 mg by mouth at bedtime. And q 6 hrs prn  .  ADVAIR DISKUS 250-50 MCG/DOSE AEPB Inhale 1 puff into the lungs daily. For shortness of breath  . aspirin EC 81 MG tablet Take 81 mg by mouth daily.  Marland Kitchen atorvastatin (LIPITOR) 40 MG tablet Take 40 mg by mouth daily.  . calcium carbonate (CALCIUM 600) 600 MG TABS tablet Take 600 mg by mouth daily with breakfast.  . carbidopa-levodopa (SINEMET CR) 50-200 MG tablet Take 1 tablet by mouth 4 (four) times daily.  . Carbidopa-Levodopa ER (SINEMET CR) 25-100 MG tablet controlled release Take 0.5 tablets by mouth 4 (four) times daily.  . carvedilol (COREG) 3.125 MG tablet take one tablet ONLY if systolic BP greater than 063  . Cholecalciferol (VITAMIN D-3) 1000 UNITS CAPS Take 2,000 Units by mouth daily.   . cholestyramine (QUESTRAN) 4 g packet TAKE 1 PACKET BY MOUTH TWICE DAILY WITH A MEAL  . donepezil (ARICEPT) 10 MG tablet TAKE ONE TABLET AT BEDTIME  . ipratropium (ATROVENT) 0.03 % nasal spray Place 2 sprays into both nostrils 3 (three) times daily.  Marland Kitchen loperamide (IMODIUM A-D) 2 MG tablet Take 2 mg by mouth 2 (two) times daily as needed for diarrhea or loose stools.  . Melatonin 5 MG TABS Take 1 tablet by mouth at bedtime.   . midodrine (PROAMATINE) 5 MG tablet Take 1 tablet (5 mg total) by mouth 2 (two) times daily with a meal.  . Multiple Vitamins-Minerals (CENTRUM SILVER PO) Take 1 tablet by mouth daily.  . nitroGLYCERIN (NITROSTAT) 0.4 MG SL tablet Place 1 tablet (0.4 mg total) under the tongue every 5 (five) minutes as needed for chest pain. PT OVERDUE FOR OV PLEASE CALL FOR APPT  . pantoprazole (PROTONIX) 40 MG tablet Take 40 mg by mouth daily.   . potassium chloride SA (K-DUR,KLOR-CON) 20 MEQ tablet Take 40 mEq by mouth daily.  . Probiotic Product (ALIGN) 4 MG CAPS Take 1 capsule by mouth daily.   . sodium chloride (OCEAN) 0.65 % SOLN nasal spray Place 1 spray into both nostrils daily as needed for congestion.  . torsemide (DEMADEX) 20 MG tablet Take 1 tablet (20 mg total) by mouth daily.  .  vitamin B-12 (CYANOCOBALAMIN) 1000 MCG tablet Take 1,000 mcg by mouth daily.   No facility-administered encounter medications on file as of 08/20/2018.     Review of Systems  Constitutional: Negative for activity change, appetite change, chills, diaphoresis, fatigue, fever and unexpected weight change.  Respiratory: Negative for cough, shortness of breath, wheezing and stridor.   Cardiovascular: Negative for chest pain, palpitations and leg swelling.  Gastrointestinal: Negative for abdominal distention, abdominal pain, constipation and diarrhea.  Genitourinary: Negative for difficulty urinating and dysuria.  Musculoskeletal: Positive for gait problem. Negative for arthralgias, back pain, joint swelling and myalgias.  Neurological: Negative for dizziness, seizures, syncope, facial asymmetry, speech difficulty,  weakness and headaches.  Hematological: Negative for adenopathy. Does not bruise/bleed easily.  Psychiatric/Behavioral: Negative for agitation, behavioral problems and confusion.    Immunization History  Administered Date(s) Administered  . Influenza-Unspecified 12/06/2014, 11/27/2017  . Pneumococcal Conjugate-13 05/22/2018  . Pneumococcal Polysaccharide-23 05/21/2005  . Tdap 06/03/2006   Pertinent  Health Maintenance Due  Topic Date Due  . INFLUENZA VACCINE  11/07/2018  . PNA vac Low Risk Adult  Completed   Fall Risk  05/12/2018 10/03/2017 12/29/2014  Falls in the past year? 1 No Yes  Number falls in past yr: 1 - 1  Injury with Fall? 0 - No  Risk for fall due to : Impaired mobility;History of fall(s);Impaired balance/gait;Mental status change;Medication side effect - Impaired balance/gait  Follow up Falls evaluation completed;Falls prevention discussed;Education provided - Falls evaluation completed   Functional Status Survey:    Vitals:   08/20/18 1618  BP: 121/72  Pulse: 87  Resp: 16  Temp: 97.9 F (36.6 C)  SpO2: 97%  Weight: 172 lb 8 oz (78.2 kg)   Body mass  index is 26.23 kg/m. Physical Exam Vitals signs and nursing note reviewed.  Constitutional:      General: He is not in acute distress.    Appearance: He is not diaphoretic.  HENT:     Head: Normocephalic and atraumatic.  Neck:     Thyroid: No thyromegaly.     Vascular: No JVD.     Trachea: No tracheal deviation.  Cardiovascular:     Rate and Rhythm: Normal rate and regular rhythm.     Heart sounds: No murmur.  Pulmonary:     Effort: Pulmonary effort is normal. No respiratory distress.     Breath sounds: Normal breath sounds. No wheezing.  Abdominal:     General: Bowel sounds are normal. There is no distension.     Palpations: Abdomen is soft.     Tenderness: There is no abdominal tenderness.     Comments: rotund  Musculoskeletal:     Right lower leg: No edema.     Left lower leg: No edema.  Lymphadenopathy:     Cervical: No cervical adenopathy.  Skin:    General: Skin is warm and dry.  Neurological:     General: No focal deficit present.     Mental Status: He is alert. Mental status is at baseline.  Psychiatric:        Mood and Affect: Mood normal.     Labs reviewed: Recent Labs    12/19/17 0518 12/20/17 0522 02/09/18 02/09/18 1251  NA 137 139 139 139  K 3.4* 3.5 3.6 3.6  CL 100 99  --  97  CO2 22 28  --  31  GLUCOSE 116* 117*  --  127*  BUN 18 21 26* 26*  CREATININE 1.03 1.06 1.3 1.26  CALCIUM 8.3* 8.7*  --  8.6  MG 1.1* 2.0  --   --   PHOS  --  3.2  --   --    Recent Labs    09/02/17 1705 12/17/17 2044 12/18/17 0533  AST 27 28 21   ALT 14* 7 13  ALKPHOS 70 83 65  BILITOT 1.8* 2.3* 1.8*  PROT 7.8 8.6* 6.7  ALBUMIN 4.1 4.3 3.3*   Recent Labs    09/02/17 1705 12/17/17 2044  12/19/17 0518 12/20/17 0522 02/09/18 02/09/18 1251  WBC 8.8 18.5*   < > 12.0* 9.9 9.6 9.6  NEUTROABS 5.3 16.7*  --   --   --   --   --  HGB 12.4* 13.1   < > 12.1* 12.3*  --  14.1  HCT 36.8* 39.7   < > 36.5* 37.6*  --  41.8  MCV 93.6 94.7   < > 95.3 94.7  --  92.7  PLT  220 232   < > 201 226  --  304.0   < > = values in this interval not displayed.   Lab Results  Component Value Date   TSH 2.428 11/12/2014   No results found for: HGBA1C No results found for: CHOL, HDL, LDLCALC, LDLDIRECT, TRIG, CHOLHDL  Significant Diagnostic Results in last 30 days:  No results found.  Assessment/Plan 1. Essential hypertension Controlled  2. Orthostatic hypotension No issues, continue midodrine   3. Chronic diastolic CHF (congestive heart failure) (HCC) EF 65-70% with grade 1 DD by echo Aug 2016 Compensated Continue torsemide 20 mg qd  4. Gastroesophageal reflux disease, esophagitis presence not specified With associated hx of esophageal stricture with dilation  Controlled with protonix 40 mg qd  5. NASH (nonalcoholic steatohepatitis) Noted, check lfts  6. Crohn's disease with other complication, unspecified gastrointestinal tract location ([place]) No issues with pain or diarrhea Continue Questran and prn immodium  7. Dementia due to Parkinson's disease without behavioral disturbance ([place]) Due for MMSE Seems to be doing well in the skilled are setting.  8. Parkinson disease ([place]) No new issues, continue sinemet 1/2 tab qid, on time  9. HLD Currently on lipitor, check lipid panel  Family/ staff Communication: staff  Labs/tests ordered:  CMP and lipid panel

## 2018-08-24 ENCOUNTER — Encounter: Payer: Self-pay | Admitting: Internal Medicine

## 2018-08-24 DIAGNOSIS — E785 Hyperlipidemia, unspecified: Secondary | ICD-10-CM | POA: Diagnosis not present

## 2018-08-24 DIAGNOSIS — Z79899 Other long term (current) drug therapy: Secondary | ICD-10-CM | POA: Diagnosis not present

## 2018-08-24 DIAGNOSIS — R05 Cough: Secondary | ICD-10-CM | POA: Diagnosis not present

## 2018-08-24 LAB — HEPATIC FUNCTION PANEL
ALT: 23 (ref 10–40)
AST: 48 — AB (ref 14–40)
Alkaline Phosphatase: 88 (ref 25–125)
Bilirubin, Total: 1.7

## 2018-08-24 LAB — LIPID PANEL
Cholesterol: 129 (ref 0–200)
HDL: 55 (ref 35–70)
LDL Cholesterol: 44
Triglycerides: 153 (ref 40–160)

## 2018-08-24 LAB — BASIC METABOLIC PANEL
BUN: 17 (ref 4–21)
Creatinine: 1.1 (ref 0.6–1.3)
Glucose: 99
Potassium: 4.1 (ref 3.4–5.3)
Sodium: 139 (ref 137–147)

## 2018-09-14 ENCOUNTER — Telehealth: Payer: Self-pay | Admitting: Neurology

## 2018-09-14 NOTE — Telephone Encounter (Signed)
I reached out to the pt's wife ( ok per dpr). She states pt is a resident is a resident of [facility]. I reached out to [facility] and spoke with Junie Panning  And we were able to complete the pre charting for 09/15/18 virtual visit.

## 2018-09-14 NOTE — Telephone Encounter (Signed)
Due to current COVID 19 pandemic, our office is severely reducing in office visits until further notice, in order to minimize the risk to our patients and healthcare providers.   Called for patient and spoke with Collie Siad at [facility]. She agreed to help patient with a virtual visit for 6/9 apt. She verbalized understanding of the doxy process. I have sent an e-mail with link and instructions, as well as my name and office number/hours. She understands that she will receive a call from RN prior to appt. Best contact number for Collie Siad is [phone].  Pt understands that although there may be some limitations with this type of visit, we will take all precautions to reduce any security or privacy concerns.  Pt understands that this will be treated like an in office visit and we will file with pt's insurance, and there may be a patient responsible charge related to this service.

## 2018-09-15 ENCOUNTER — Encounter: Payer: Self-pay | Admitting: Neurology

## 2018-09-15 ENCOUNTER — Ambulatory Visit (INDEPENDENT_AMBULATORY_CARE_PROVIDER_SITE_OTHER): Payer: PPO | Admitting: Neurology

## 2018-09-15 ENCOUNTER — Other Ambulatory Visit: Payer: Self-pay

## 2018-09-15 DIAGNOSIS — G20 Parkinson's disease: Secondary | ICD-10-CM | POA: Diagnosis not present

## 2018-09-15 DIAGNOSIS — F0280 Dementia in other diseases classified elsewhere without behavioral disturbance: Secondary | ICD-10-CM

## 2018-09-15 DIAGNOSIS — R269 Unspecified abnormalities of gait and mobility: Secondary | ICD-10-CM

## 2018-09-15 NOTE — Progress Notes (Signed)
     Virtual Visit via Video Note  I connected with [name] [name] on 09/15/18 at  4:00 PM EDT by a video enabled telemedicine application and verified that I am speaking with the correct person using two identifiers.  Location: Patient: The patient is in an extended care facility. Provider: Physician in office.   I discussed the limitations of evaluation and management by telemedicine and the availability of in person appointments. The patient expressed understanding and agreed to proceed.  History of Present Illness: [name] [name] is an 85 year old right-handed white male with history of Parkinson's disease associated with a memory disturbance.  The patient resides in an extended care facility currently.  The patient is on Sinemet, the dose was increased on the last visit.  He is on the 50/200 mg tablets 4 times daily, he takes half of a 25/100 mg tablet 4 times a day with the CR tablet.  He is on donepezil for his memory problem.  He uses a walker for ambulation.  He does have orthostatic hypotension, he is on midodrine for this.  He reports that he fell yesterday, he apparently sat down into a chair that was not locked down and slid backwards.  He did not sustain injury.  The patient reports that his memory has been relatively well preserved, the caretaker indicates that there have been no episodes of severe confusion or hallucinations.  The patient tries to walk regularly, he will go outside with a walker to get exercise.   Observations/Objective: The video evaluation reveals the patient is alert and cooperative.  He has masking the face, facial expressions are otherwise symmetric.  He is able to protrude the tongue in the midline with good lateral movement the tongue.  He has full extraocular movements.  Speech is slightly hypophonic.  There is no aphasia or dysarthria.  He is able to form finger-nose-finger well.  When the patient attempted to ambulate, we lost video contact, he did not have a  caretaker in the room with him.  We were unable to reestablish video contact.  Assessment and Plan: 1.  Parkinson's disease  2.  Memory disturbance  3.  Gait disturbance  We will continue the donepezil for now.  We will not alter the Sinemet dosing.  We will see the patient back in 3 months.  The patient is getting help with exercise through her therapist currently.  He appears to be safe with walking, he uses his walker regularly.  We will need to follow the memory issues over time.  Follow Up Instructions: 42-monthfollow-up with me.   I discussed the assessment and treatment plan with the patient. The patient was provided an opportunity to ask questions and all were answered. The patient agreed with the plan and demonstrated an understanding of the instructions.   The patient was advised to call back or seek an in-person evaluation if the symptoms worsen or if the condition fails to improve as anticipated.  I provided 20 minutes of non-face-to-face time during this encounter.   CKathrynn Ducking MD

## 2018-09-16 DIAGNOSIS — L821 Other seborrheic keratosis: Secondary | ICD-10-CM | POA: Diagnosis not present

## 2018-09-16 DIAGNOSIS — L814 Other melanin hyperpigmentation: Secondary | ICD-10-CM | POA: Diagnosis not present

## 2018-09-16 DIAGNOSIS — L570 Actinic keratosis: Secondary | ICD-10-CM | POA: Diagnosis not present

## 2018-09-16 DIAGNOSIS — Z85820 Personal history of malignant melanoma of skin: Secondary | ICD-10-CM | POA: Diagnosis not present

## 2018-09-16 DIAGNOSIS — L820 Inflamed seborrheic keratosis: Secondary | ICD-10-CM | POA: Diagnosis not present

## 2018-09-16 DIAGNOSIS — Z85828 Personal history of other malignant neoplasm of skin: Secondary | ICD-10-CM | POA: Diagnosis not present

## 2018-09-17 DIAGNOSIS — Z20828 Contact with and (suspected) exposure to other viral communicable diseases: Secondary | ICD-10-CM | POA: Diagnosis not present

## 2018-09-20 LAB — NOVEL CORONAVIRUS, NAA: SARS-CoV-2, NAA: NEGATIVE

## 2018-10-02 ENCOUNTER — Non-Acute Institutional Stay (SKILLED_NURSING_FACILITY): Payer: PPO | Admitting: Adult Health

## 2018-10-02 DIAGNOSIS — I5032 Chronic diastolic (congestive) heart failure: Secondary | ICD-10-CM

## 2018-10-02 DIAGNOSIS — K50918 Crohn's disease, unspecified, with other complication: Secondary | ICD-10-CM

## 2018-10-02 DIAGNOSIS — K219 Gastro-esophageal reflux disease without esophagitis: Secondary | ICD-10-CM

## 2018-10-02 DIAGNOSIS — I951 Orthostatic hypotension: Secondary | ICD-10-CM

## 2018-10-02 DIAGNOSIS — K7581 Nonalcoholic steatohepatitis (NASH): Secondary | ICD-10-CM | POA: Diagnosis not present

## 2018-10-02 DIAGNOSIS — G20 Parkinson's disease: Secondary | ICD-10-CM | POA: Diagnosis not present

## 2018-10-02 DIAGNOSIS — F0280 Dementia in other diseases classified elsewhere without behavioral disturbance: Secondary | ICD-10-CM | POA: Diagnosis not present

## 2018-10-06 ENCOUNTER — Encounter: Payer: Self-pay | Admitting: Adult Health

## 2018-10-06 NOTE — Progress Notes (Signed)
Location:  Occupational psychologist of Service:  SNF (31) Provider:   Cindi Carbon, ANP [place] [phone]   Gayland Curry, DO  Patient Care Team: Gayland Curry, DO as PCP - General (Geriatric Medicine) [name], [name], MD as Consulting Physician (Cardiology) [name], MD as Attending Physician (Gastroenterology) [name], MD as Consulting Physician (Neurology) Community, [facility] ([place]) Royal Hawthorn, NP as Nurse Practitioner (Nurse Practitioner)  Extended Emergency Contact Information Primary Emergency Contact: [name] Address: [address] [place] Phone: [phone] Mobile Phone: [phone] Relation: Spouse  Code Status:  DNR Goals of care: Advanced Directive information Advanced Directives 02/24/2018  Does Patient Have a Medical Advance Directive? Yes  Type of Paramedic of [place];Living will;Out of facility DNR (pink MOST or yellow form)  Does patient want to make changes to medical advance directive? No - Patient declined  Copy of [place] in Chart? Yes - validated most recent copy scanned in chart (See row information)  Pre-existing out of facility DNR order (yellow form or pink MOST form) Yellow form placed in chart (order not valid for inpatient use)     Chief Complaint  Patient presents with  . Medical Management of Chronic Issues    HPI:  Pt is a 85 y.o. male seen today for medical management of chronic diseases. The nurse reports that he fell during a transfer on 6/15.  He has some bruising to his left hip and back area but no pain or tenderness. He has been able to bear weight without pain.   PD: remains sinemet four times daily. No issues taking as prescribed. No increased rigidity or tremor  Orthostatic hypotension: SBP 110-130, no issues with syncope  Diastolic  CHF: weight is unchanged, no cp, sob, doe, or pnd  GERD: denies indigestion or burning. No issues swallowing  NASH:  Lab Results  Component Value Date   ALT 23 08/24/2018   AST 48 (A) 08/24/2018   ALKPHOS 88 08/24/2018   BILITOT 1.8 (H) 12/18/2017   Dementia associated with PD: MMSE 12/30/17 25/30, due for another test Continues to communicate well, transfers into his motorized chair and participatory in care/conversation   Crohns: no reported issues with diarrhea or abd pain. Weight is stable, appetite good.   Past Medical History:  Diagnosis Date  . Cellulitis   . Chronic diastolic CHF (congestive heart failure) (HCC)    a. EF initially 35-40% after MI 1/09; b. echo 7/08: EF 60%;  c. 11/2014 Echo: EF 65-70%, Gr 1 DD, mild MR.  . Coronary artery disease    a. s/p anterior STEMI 04/2007 with BMS-> LAD;  b. Cath 12/2011 patent stent;  c. low risk nuc 04/2014.  . Crohn's disease ([place])   . Diaphragmatic hernia without mention of obstruction or gangrene   . Diverticulosis of colon (without mention of hemorrhage)   . Fatty liver    a. on ultrasound of 10/2009  . Flatulence, eructation, and gas pain   . Fungal infection   . Gait disorder   . GERD (gastroesophageal reflux disease)   . HOH (hard of hearing)    Hearing aids  . Hyperlipidemia   . Hypertension   . Ischemic cardiomyopathy    a. EF initially 35-40% after MI 1/09; b. echo 7/08: EF 60%;  c. 11/2014 Echo: EF 65-70%, Gr  1 DD, mild MR.  . Melanoma ([place])   . Memory disorder   . Orthostatic hypotension   . Osteoporosis   . Other chronic nonalcoholic liver disease   . Other esophagitis   . Parkinson's disease ([place])   . RBBB 09/02/2013  . Regional enteritis of small intestine ([place])   . Renal calculi   . Restless leg syndrome 03/22/2015  . Ulcerative (chronic) ileocolitis ([place])   . Urinary incontinence    Past Surgical History:  Procedure Laterality Date  . APPENDECTOMY    . BACK SURGERY     L3, L4, L5  . CARDIAC  CATHETERIZATION  09/25/06   EF 35-40% but more recently 60%  . CATARACT EXTRACTION     Bilateral  . CHOLECYSTECTOMY    . Coronary artery stent placement    . Melanoma resection    . Partial bowel resection    . TONSILLECTOMY      No Known Allergies  Outpatient Encounter Medications as of 10/02/2018  Medication Sig  . acetaminophen (TYLENOL) 325 MG tablet Take 650 mg by mouth at bedtime. And q 6 hrs prn  . ADVAIR DISKUS 250-50 MCG/DOSE AEPB Inhale 1 puff into the lungs daily. Daily For shortness of breath  . aspirin EC 81 MG tablet Take 81 mg by mouth daily.  Marland Kitchen atorvastatin (LIPITOR) 40 MG tablet Take 40 mg by mouth daily.  . calcium carbonate (CALCIUM 600) 600 MG TABS tablet Take 600 mg by mouth daily with breakfast.  . carbidopa-levodopa (SINEMET CR) 50-200 MG tablet Take 1 tablet by mouth 4 (four) times daily.  . Carbidopa-Levodopa ER (SINEMET CR) 25-100 MG tablet controlled release Take 0.5 tablets by mouth 4 (four) times daily.  . carvedilol (COREG) 3.125 MG tablet take one tablet ONLY if systolic BP greater than 767  . Cholecalciferol (VITAMIN D-3) 1000 UNITS CAPS Take 2,000 Units by mouth daily.   . cholestyramine (QUESTRAN) 4 g packet TAKE 1 PACKET BY MOUTH TWICE DAILY WITH A MEAL  . donepezil (ARICEPT) 10 MG tablet TAKE ONE TABLET AT BEDTIME  . ipratropium (ATROVENT) 0.03 % nasal spray Place 2 sprays into both nostrils 3 (three) times daily.  Marland Kitchen loperamide (IMODIUM A-D) 2 MG tablet Take 2 mg by mouth 2 (two) times daily as needed for diarrhea or loose stools.  . Melatonin 5 MG TABS Take 1 tablet by mouth at bedtime.   . midodrine (PROAMATINE) 5 MG tablet Take 1 tablet (5 mg total) by mouth 2 (two) times daily with a meal.  . Multiple Vitamins-Minerals (CENTRUM SILVER PO) Take 1 tablet by mouth daily.  . nitroGLYCERIN (NITROSTAT) 0.4 MG SL tablet Place 1 tablet (0.4 mg total) under the tongue every 5 (five) minutes as needed for chest pain. PT OVERDUE FOR OV PLEASE CALL FOR APPT   . pantoprazole (PROTONIX) 40 MG tablet Take 40 mg by mouth daily.   . potassium chloride SA (K-DUR,KLOR-CON) 20 MEQ tablet Take 40 mEq by mouth daily.  . Probiotic Product (ALIGN) 4 MG CAPS Take 1 capsule by mouth daily.   . sodium chloride (OCEAN) 0.65 % SOLN nasal spray Place 1 spray into both nostrils daily as needed for congestion.  . torsemide (DEMADEX) 20 MG tablet Take 1 tablet (20 mg total) by mouth daily.  . vitamin B-12 (CYANOCOBALAMIN) 1000 MCG tablet Take 1,000 mcg by mouth daily.  . [DISCONTINUED] Carbidopa-Levodopa ER (SINEMET CR) 25-100 MG tablet controlled release Take 0.5 tablets by mouth 4 (four) times daily.   No facility-administered  encounter medications on file as of 10/02/2018.     Review of Systems  Constitutional: Negative for activity change, appetite change, chills, diaphoresis, fatigue, fever and unexpected weight change.  Respiratory: Negative for cough, shortness of breath, wheezing and stridor.   Cardiovascular: Negative for chest pain, palpitations and leg swelling.  Gastrointestinal: Negative for abdominal distention, abdominal pain, constipation and diarrhea.  Genitourinary: Negative for difficulty urinating and dysuria.  Musculoskeletal: Positive for gait problem. Negative for arthralgias, back pain, joint swelling and myalgias.  Neurological: Negative for dizziness, seizures, syncope, facial asymmetry, speech difficulty, weakness and headaches.  Hematological: Negative for adenopathy. Does not bruise/bleed easily.  Psychiatric/Behavioral: Positive for confusion. Negative for agitation and behavioral problems.    Immunization History  Administered Date(s) Administered  . Influenza-Unspecified 12/06/2014, 11/27/2017  . Pneumococcal Conjugate-13 05/22/2018  . Pneumococcal Polysaccharide-23 05/21/2005  . Tdap 06/03/2006   Pertinent  Health Maintenance Due  Topic Date Due  . INFLUENZA VACCINE  11/07/2018  . PNA vac Low Risk Adult  Completed   Fall  Risk  05/12/2018 10/03/2017 12/29/2014  Falls in the past year? 1 No Yes  Number falls in past yr: 1 - 1  Injury with Fall? 0 - No  Risk for fall due to : Impaired mobility;History of fall(s);Impaired balance/gait;Mental status change;Medication side effect - Impaired balance/gait  Follow up Falls evaluation completed;Falls prevention discussed;Education provided - Falls evaluation completed   Functional Status Survey:    Vitals:   10/06/18 0646  Weight: 172 lb 8 oz (78.2 kg)   Body mass index is 26.23 kg/m. Physical Exam Vitals signs and nursing note reviewed.  Constitutional:      General: He is not in acute distress.    Appearance: He is not diaphoretic.  HENT:     Head: Normocephalic and atraumatic.  Neck:     Thyroid: No thyromegaly.     Vascular: No JVD.     Trachea: No tracheal deviation.  Cardiovascular:     Rate and Rhythm: Normal rate and regular rhythm.     Heart sounds: No murmur.  Pulmonary:     Effort: Pulmonary effort is normal. No respiratory distress.     Breath sounds: Normal breath sounds. No wheezing.  Abdominal:     General: Bowel sounds are normal. There is no distension.     Palpations: Abdomen is soft.     Tenderness: There is no abdominal tenderness.  Musculoskeletal:     Right lower leg: No edema.     Left lower leg: No edema.  Lymphadenopathy:     Cervical: No cervical adenopathy.  Skin:    General: Skin is warm and dry.  Neurological:     General: No focal deficit present.     Mental Status: He is alert. Mental status is at baseline.     Cranial Nerves: No cranial nerve deficit.  Psychiatric:        Mood and Affect: Mood normal.     Labs reviewed: Recent Labs    12/19/17 0518 12/20/17 0522 02/09/18 02/09/18 1251 08/24/18  NA 137 139 139 139 139  K 3.4* 3.5 3.6 3.6 4.1  CL 100 99  --  97  --   CO2 22 28  --  31  --   GLUCOSE 116* 117*  --  127*  --   BUN 18 21 26* 26* 17  CREATININE 1.03 1.06 1.3 1.26 1.1  CALCIUM 8.3* 8.7*  --   8.6  --   MG 1.1* 2.0  --   --   --  PHOS  --  3.2  --   --   --    Recent Labs    12/17/17 2044 12/18/17 0533 08/24/18  AST 28 21 48*  ALT 7 13 23   ALKPHOS 83 65 88  BILITOT 2.3* 1.8*  --   PROT 8.6* 6.7  --   ALBUMIN 4.3 3.3*  --    Recent Labs    12/17/17 2044  12/19/17 0518 12/20/17 0522 02/09/18 02/09/18 1251  WBC 18.5*   < > 12.0* 9.9 9.6 9.6  NEUTROABS 16.7*  --   --   --   --   --   HGB 13.1   < > 12.1* 12.3*  --  14.1  HCT 39.7   < > 36.5* 37.6*  --  41.8  MCV 94.7   < > 95.3 94.7  --  92.7  PLT 232   < > 201 226  --  304.0   < > = values in this interval not displayed.   Lab Results  Component Value Date   TSH 2.428 11/12/2014   No results found for: HGBA1C Lab Results  Component Value Date   CHOL 129 08/24/2018   HDL 55 08/24/2018   LDLCALC 44 08/24/2018   TRIG 153 08/24/2018    Significant Diagnostic Results in last 30 days:  No results found.  Assessment/Plan 1. Dementia due to Parkinson's disease without behavioral disturbance ([place]) Doing well in the skilled care unit. Needs follow up MMSE  Continue Aricept 10 mg qd  2. Parkinson disease (HCC) Continue Sinemet 1 tab QID  3. NASH (nonalcoholic steatohepatitis) Continue to monitor LFTS  4. Gastroesophageal reflux disease, esophagitis presence not specified Continue Protonix 40 mg qd  5. Crohn's disease with other complication, unspecified gastrointestinal tract location ([place]) No acute exacerbations Continue Questran and Align   6. Orthostatic hypotension Continue midodrine 5 mg bid  7. Chronic diastolic CHF (congestive heart failure) (HCC) Compensated Continue torsemide 20 mg qd     Family/ staff Communication: staff/resident  Labs/tests ordered:  NA

## 2018-10-16 DIAGNOSIS — L814 Other melanin hyperpigmentation: Secondary | ICD-10-CM | POA: Diagnosis not present

## 2018-10-16 DIAGNOSIS — L821 Other seborrheic keratosis: Secondary | ICD-10-CM | POA: Diagnosis not present

## 2018-10-16 DIAGNOSIS — D485 Neoplasm of uncertain behavior of skin: Secondary | ICD-10-CM | POA: Diagnosis not present

## 2018-10-16 DIAGNOSIS — Z85820 Personal history of malignant melanoma of skin: Secondary | ICD-10-CM | POA: Diagnosis not present

## 2018-10-16 DIAGNOSIS — L570 Actinic keratosis: Secondary | ICD-10-CM | POA: Diagnosis not present

## 2018-10-16 DIAGNOSIS — C44329 Squamous cell carcinoma of skin of other parts of face: Secondary | ICD-10-CM | POA: Diagnosis not present

## 2018-10-16 DIAGNOSIS — Z85828 Personal history of other malignant neoplasm of skin: Secondary | ICD-10-CM | POA: Diagnosis not present

## 2018-10-22 ENCOUNTER — Non-Acute Institutional Stay (SKILLED_NURSING_FACILITY): Payer: PPO | Admitting: Adult Health

## 2018-10-22 ENCOUNTER — Encounter: Payer: Self-pay | Admitting: Adult Health

## 2018-10-22 DIAGNOSIS — Z0000 Encounter for general adult medical examination without abnormal findings: Secondary | ICD-10-CM

## 2018-10-22 NOTE — Progress Notes (Signed)
Subjective:   [name] [name] is a 85 y.o. male who presents for Medicare Annual/Subsequent preventive examination.  Review of Systems:   Cardiac Risk Factors include: advanced age (>40mn, >>41women);dyslipidemia;hypertension;family history of premature cardiovascular disease     Objective:    Vitals: Wt 175 lb 6.4 oz (79.6 kg)   BMI 26.67 kg/m   Body mass index is 26.67 kg/m.  Advanced Directives 10/22/2018 02/24/2018 12/23/2017 12/18/2017 03/22/2015 12/18/2014 12/13/2014  Does Patient Have a Medical Advance Directive? Yes Yes Yes Yes Yes Yes Yes  Type of AParamedicof AVinaLiving will HFurnasLiving will;Out of facility DNR (pink MOST or yellow form) HSiloLiving will;Out of facility DNR (pink MOST or yellow form) HFairfaxLiving will HLoyalLiving will HSebringLiving will;Out of facility DNR (pink MOST or yellow form) HTrego-Rohrersville StationLiving will  Does patient want to make changes to medical advance directive? No - Patient declined No - Patient declined No - Patient declined No - Patient declined - No - Patient declined -  Copy of [place]in Chart? Yes - validated most recent copy scanned in chart (See row information) Yes - validated most recent copy scanned in chart (See row information) Yes Yes - Yes -  Pre-existing out of facility DNR order (yellow form or pink MOST form) - Yellow form placed in chart (order not valid for inpatient use) Yellow form placed in chart (order not valid for inpatient use) - - Yellow form placed in chart (order not valid for inpatient use) -    Tobacco Social History   Tobacco Use  Smoking Status Never Smoker  Smokeless Tobacco Never Used     Counseling given: Not Answered   Clinical Intake:                       Past Medical History:  Diagnosis Date  . Cellulitis   .  Chronic diastolic CHF (congestive heart failure) (HCC)    a. EF initially 35-40% after MI 1/09; b. echo 7/08: EF 60%;  c. 11/2014 Echo: EF 65-70%, Gr 1 DD, mild MR.  . Coronary artery disease    a. s/p anterior STEMI 04/2007 with BMS-> LAD;  b. Cath 12/2011 patent stent;  c. low risk nuc 04/2014.  . Crohn's disease ([place]   . Diaphragmatic hernia without mention of obstruction or gangrene   . Diverticulosis of colon (without mention of hemorrhage)   . Fatty liver    a. on ultrasound of 10/2009  . Flatulence, eructation, and gas pain   . Fungal infection   . Gait disorder   . GERD (gastroesophageal reflux disease)   . HOH (hard of hearing)    Hearing aids  . Hyperlipidemia   . Hypertension   . Ischemic cardiomyopathy    a. EF initially 35-40% after MI 1/09; b. echo 7/08: EF 60%;  c. 11/2014 Echo: EF 65-70%, Gr 1 DD, mild MR.  . Melanoma ([place]   . Memory disorder   . Orthostatic hypotension   . Osteoporosis   . Other chronic nonalcoholic liver disease   . Other esophagitis   . Parkinson's disease (HMamou   . RBBB 09/02/2013  . Regional enteritis of small intestine ([place]   . Renal calculi   . Restless leg syndrome 03/22/2015  . Ulcerative (chronic) ileocolitis (HV[place]   . Urinary incontinence    Past Surgical History:  Procedure  Laterality Date  . APPENDECTOMY    . BACK SURGERY     L3, L4, L5  . CARDIAC CATHETERIZATION  09/25/06   EF 35-40% but more recently 60%  . CATARACT EXTRACTION     Bilateral  . CHOLECYSTECTOMY    . Coronary artery stent placement    . Melanoma resection    . Partial bowel resection    . TONSILLECTOMY     Family History  Problem Relation Age of Onset  . Heart failure Mother   . Heart attack Mother   . Hypertension Mother   . Emphysema Father   . Heart disease Brother   . Heart disease Brother   . Coronary artery disease Unknown   . Colon cancer Brother        older at dx  . Stomach cancer Neg Hx    Social History   Socioeconomic History  .  Marital status: Married    Spouse name: [name]  . Number of children: 3  . Years of education: 16  . Highest education level: Bachelor's degree (e.g., BA, AB, BS)  Occupational History  . Occupation: CEO-retired    Employer: [employer]  . Occupation: Licensed conveyancer: [place]  . Financial resource strain: Not hard at all  . Food insecurity    Worry: Never true    Inability: Never true  . Transportation needs    Medical: No    Non-medical: No  Tobacco Use  . Smoking status: Never Smoker  . Smokeless tobacco: Never Used  Substance and Sexual Activity  . Alcohol use: Yes    Alcohol/week: 2.0 - 3.0 standard drinks    Types: 2 - 3 Standard drinks or equivalent per week    Comment: occassionally  . Drug use: No  . Sexual activity: Not Currently  Lifestyle  . Physical activity    Days per week: 0 days    Minutes per session: 0 min  . Stress: Not at all  Relationships  . Social connections    Talks on phone: More than three times a week    Gets together: More than three times a week    Attends religious service: Never    Active member of club or organization: No    Attends meetings of clubs or organizations: Never    Relationship status: Married  Other Topics Concern  . Not on file  Social History Narrative   Lives w/ his wife at [place]   Patient is right handed.   Patient drinks very little caffeine.    Outpatient Encounter Medications as of 10/22/2018  Medication Sig  . acetaminophen (TYLENOL) 325 MG tablet Take 650 mg by mouth at bedtime. And q 6 hrs prn  . ADVAIR DISKUS 250-50 MCG/DOSE AEPB Inhale 1 puff into the lungs daily. Daily For shortness of breath  . aspirin EC 81 MG tablet Take 81 mg by mouth daily.  Marland Kitchen atorvastatin (LIPITOR) 40 MG tablet Take 40 mg by mouth daily.  . calcium carbonate (CALCIUM 600) 600 MG TABS tablet Take 600 mg by mouth daily with breakfast.  . carbidopa-levodopa (SINEMET CR) 50-200 MG  tablet Take 1 tablet by mouth 4 (four) times daily.  . Carbidopa-Levodopa ER (SINEMET CR) 25-100 MG tablet controlled release Take 0.5 tablets by mouth 4 (four) times daily.  . carvedilol (COREG) 3.125 MG tablet take one tablet ONLY if systolic BP greater than 350  . Cholecalciferol (VITAMIN D-3) 1000 UNITS CAPS Take  2,000 Units by mouth daily.   . cholestyramine (QUESTRAN) 4 g packet TAKE 1 PACKET BY MOUTH TWICE DAILY WITH A MEAL  . donepezil (ARICEPT) 10 MG tablet TAKE ONE TABLET AT BEDTIME  . ipratropium (ATROVENT) 0.03 % nasal spray Place 2 sprays into both nostrils 3 (three) times daily.  Marland Kitchen loperamide (IMODIUM A-D) 2 MG tablet Take 2 mg by mouth 2 (two) times daily as needed for diarrhea or loose stools.  . Melatonin 5 MG TABS Take 1 tablet by mouth at bedtime.   . midodrine (PROAMATINE) 5 MG tablet Take 1 tablet (5 mg total) by mouth 2 (two) times daily with a meal.  . Multiple Vitamins-Minerals (CENTRUM SILVER PO) Take 1 tablet by mouth daily.  . nitroGLYCERIN (NITROSTAT) 0.4 MG SL tablet Place 1 tablet (0.4 mg total) under the tongue every 5 (five) minutes as needed for chest pain. PT OVERDUE FOR OV PLEASE CALL FOR APPT  . pantoprazole (PROTONIX) 40 MG tablet Take 40 mg by mouth daily.   . potassium chloride SA (K-DUR,KLOR-CON) 20 MEQ tablet Take 40 mEq by mouth daily.  . Probiotic Product (ALIGN) 4 MG CAPS Take 1 capsule by mouth daily.   . sodium chloride (OCEAN) 0.65 % SOLN nasal spray Place 1 spray into both nostrils daily as needed for congestion.  . torsemide (DEMADEX) 20 MG tablet Take 1 tablet (20 mg total) by mouth daily.  . vitamin B-12 (CYANOCOBALAMIN) 1000 MCG tablet Take 1,000 mcg by mouth daily.   No facility-administered encounter medications on file as of 10/22/2018.     Activities of Daily Living In your present state of health, do you have any difficulty performing the following activities: 10/22/2018 12/18/2017  Hearing? N N  Vision? N N  Difficulty concentrating or  making decisions? Y N  Walking or climbing stairs? Y Y  Dressing or bathing? - Y  Doing errands, shopping? [place]  Preparing Food and eating ? N -  Using the Toilet? Y -  In the past six months, have you accidently leaked urine? Y -  Do you have problems with loss of bowel control? Y -  Managing your Medications? Y -  Managing your Finances? Y -  Housekeeping or managing your Housekeeping? Y -  Some recent data might be hidden    Patient Care Team: Gayland Curry, DO as PCP - General (Geriatric Medicine) [name], [name], MD as Consulting Physician (Cardiology) [name], MD as Attending Physician (Gastroenterology) [name], MD as Consulting Physician (Neurology) Community, [facility] ([place]) Royal Hawthorn, NP as Nurse Practitioner (Nurse Practitioner)   Assessment:   This is a routine wellness examination for [facility].  Exercise Activities and Dietary recommendations Current Exercise Habits: Home exercise routine;The patient does not participate in regular exercise at present, Exercise limited by: neurologic condition(s);orthopedic condition(s)  Goals    . Exercise 3x per week (30 min per time)       Fall Risk Fall Risk  10/22/2018 05/12/2018 10/03/2017 12/29/2014  Falls in the past year? 1 1 No Yes  Number falls in past yr: 1 1 - 1  Injury with Fall? 1 0 - No  Risk for fall due to : History of fall(s);Impaired mobility Impaired mobility;History of fall(s);Impaired balance/gait;Mental status change;Medication side effect - Impaired balance/gait  Follow up Falls evaluation completed;Falls prevention discussed Falls evaluation completed;Falls prevention discussed;Education provided - Falls evaluation completed   Is the patient's home free of loose throw rugs in walkways, pet  beds, electrical cords, etc?   yes      Grab bars in the bathroom? yes      Handrails on the stairs?   yes      Adequate lighting?   yes  Timed Get Up and Go  Performed: not ambuatory  Depression Screen PHQ 2/9 Scores 10/22/2018 05/20/2018 12/18/2017 12/29/2014  PHQ - 2 Score 0 0 0 0    Cognitive Function MMSE - Mini Mental State Exam 10/22/2018 02/20/2018 10/03/2017 05/02/2017 12/18/2016  Orientation to time 4 4 4 4 2   Orientation to Place 5 5 5 5 5   Registration 3 3 3 3 3   Attention/ Calculation 5 4 1 5 2   Recall 3 3 3 3 2   Language- name 2 objects 2 2 2 2 2   Language- repeat 1 1 1 1 1   Language- follow 3 step command 3 3 3 3 3   Language- read & follow direction 1 1 1 1 1   Write a sentence 1 1 1 1  0  Copy design 1 0 1 1 1   Total score 29 27 25 29 22         Immunization History  Administered Date(s) Administered  . Influenza-Unspecified 12/06/2014, 11/27/2017  . Pneumococcal Conjugate-13 05/22/2018  . Pneumococcal Polysaccharide-23 05/21/2005  . Tdap 06/03/2006    Qualifies for Shingles Vaccine? Needed.  Screening Tests Health Maintenance  Topic Date Due  . TETANUS/TDAP  06/03/2016  . INFLUENZA VACCINE  11/07/2018  . PNA vac Low Risk Adult  Completed   Cancer Screenings: Lung: Low Dose CT Chest recommended if Age 29-80 years, 30 pack-year currently smoking OR have quit w/in 15years. Patient does not qualify. Colorectal: aged out  Additional Screenings: not indicated Hepatitis C Screening:not indicated       Plan:     I have personally reviewed and noted the following in the patient's chart:   . Medical and social history . Use of alcohol, tobacco or illicit drugs  . Current medications and supplements . Functional ability and status . Nutritional status . Physical activity . Advanced directives . List of other physicians . Hospitalizations, surgeries, and ER visits in previous 12 months . Vitals . Screenings to include cognitive, depression, and falls . Referrals and appointments  In addition, I have reviewed and discussed with patient certain preventive protocols, quality metrics, and best practice  recommendations. A written personalized care plan for preventive services as well as general preventive health recommendations were provided to patient.     Royal Hawthorn, NP  10/22/2018

## 2018-10-22 NOTE — Patient Instructions (Signed)
[name] , Thank you for taking time to come for your Medicare Wellness Visit. I appreciate your ongoing commitment to your health goals. Please review the following plan we discussed and let me know if I can assist you in the future.   Screening recommendations/referrals: Colonoscopy aged out Recommended yearly ophthalmology/optometry visit for glaucoma screening and checkup Recommended yearly dental visit for hygiene and checkup  Vaccinations: Influenza vaccine not due  Pneumococcal vaccine not due  Tdap vaccine ordered Shingles vaccine ordered     Advanced directives: Reviewed  Conditions/risks identified: fall and cardiac risk  Next appointment: 1 year   Preventive Care 55 Years and Older, Male Preventive care refers to lifestyle choices and visits with your health care provider that can promote health and wellness. What does preventive care include?  A yearly physical exam. This is also called an annual well check.  Dental exams once or twice a year.  Routine eye exams. Ask your health care provider how often you should have your eyes checked.  Personal lifestyle choices, including:  Daily care of your teeth and gums.  Regular physical activity.  Eating a healthy diet.  Avoiding tobacco and drug use.  Limiting alcohol use.  Practicing safe sex.  Taking low doses of aspirin every day.  Taking vitamin and mineral supplements as recommended by your health care provider. What happens during an annual well check? The services and screenings done by your health care provider during your annual well check will depend on your age, overall health, lifestyle risk factors, and family history of disease. Counseling  Your health care provider may ask you questions about your:  Alcohol use.  Tobacco use.  Drug use.  Emotional well-being.  Home and relationship well-being.  Sexual activity.  Eating habits.  History of falls.  Memory and ability to understand  (cognition).  Work and work Statistician. Screening  You may have the following tests or measurements:  Height, weight, and BMI.  Blood pressure.  Lipid and cholesterol levels. These may be checked every 5 years, or more frequently if you are over 7 years old.  Skin check.  Lung cancer screening. You may have this screening every year starting at age 2 if you have a 30-pack-year history of smoking and currently smoke or have quit within the past 15 years.  Fecal occult blood test (FOBT) of the stool. You may have this test every year starting at age 59.  Flexible sigmoidoscopy or colonoscopy. You may have a sigmoidoscopy every 5 years or a colonoscopy every 10 years starting at age 42.  Prostate cancer screening. Recommendations will vary depending on your family history and other risks.  Hepatitis C blood test.  Hepatitis B blood test.  Sexually transmitted disease (STD) testing.  Diabetes screening. This is done by checking your blood sugar (glucose) after you have not eaten for a while (fasting). You may have this done every 1-3 years.  Abdominal aortic aneurysm (AAA) screening. You may need this if you are a current or former smoker.  Osteoporosis. You may be screened starting at age 40 if you are at high risk. Talk with your health care provider about your test results, treatment options, and if necessary, the need for more tests. Vaccines  Your health care provider may recommend certain vaccines, such as:  Influenza vaccine. This is recommended every year.  Tetanus, diphtheria, and acellular pertussis (Tdap, Td) vaccine. You may need a Td booster every 10 years.  Zoster vaccine. You may need this  after age 60.  Pneumococcal 13-valent conjugate (PCV13) vaccine. One dose is recommended after age 35.  Pneumococcal polysaccharide (PPSV23) vaccine. One dose is recommended after age 39. Talk to your health care provider about which screenings and vaccines you need and  how often you need them. This information is not intended to replace advice given to you by your health care provider. Make sure you discuss any questions you have with your health care provider. Document Released: 04/21/2015 Document Revised: 12/13/2015 Document Reviewed: 01/24/2015 Elsevier Interactive Patient Education  2017 [place] Prevention in the Home Falls can cause injuries. They can happen to people of all ages. There are many things you can do to make your home safe and to help prevent falls. What can I do on the outside of my home?  Regularly fix the edges of walkways and driveways and fix any cracks.  Remove anything that might make you trip as you walk through a door, such as a raised step or threshold.  Trim any bushes or trees on the path to your home.  Use bright outdoor lighting.  Clear any walking paths of anything that might make someone trip, such as rocks or tools.  Regularly check to see if handrails are loose or broken. Make sure that both sides of any steps have handrails.  Any raised decks and porches should have guardrails on the edges.  Have any leaves, snow, or ice cleared regularly.  Use sand or salt on walking paths during winter.  Clean up any spills in your garage right away. This includes oil or grease spills. What can I do in the bathroom?  Use night lights.  Install grab bars by the toilet and in the tub and shower. Do not use towel bars as grab bars.  Use non-skid mats or decals in the tub or shower.  If you need to sit down in the shower, use a plastic, non-slip stool.  Keep the floor dry. Clean up any water that spills on the floor as soon as it happens.  Remove soap buildup in the tub or shower regularly.  Attach bath mats securely with double-sided non-slip rug tape.  Do not have throw rugs and other things on the floor that can make you trip. What can I do in the bedroom?  Use night lights.  Make sure that you  have a light by your bed that is easy to reach.  Do not use any sheets or blankets that are too big for your bed. They should not hang down onto the floor.  Have a firm chair that has side arms. You can use this for support while you get dressed.  Do not have throw rugs and other things on the floor that can make you trip. What can I do in the kitchen?  Clean up any spills right away.  Avoid walking on wet floors.  Keep items that you use a lot in easy-to-reach places.  If you need to reach something above you, use a strong step stool that has a grab bar.  Keep electrical cords out of the way.  Do not use floor polish or wax that makes floors slippery. If you must use wax, use non-skid floor wax.  Do not have throw rugs and other things on the floor that can make you trip. What can I do with my stairs?  Do not leave any items on the stairs.  Make sure that there are handrails on both sides of the  stairs and use them. Fix handrails that are broken or loose. Make sure that handrails are as long as the stairways.  Check any carpeting to make sure that it is firmly attached to the stairs. Fix any carpet that is loose or worn.  Avoid having throw rugs at the top or bottom of the stairs. If you do have throw rugs, attach them to the floor with carpet tape.  Make sure that you have a light switch at the top of the stairs and the bottom of the stairs. If you do not have them, ask someone to add them for you. What else can I do to help prevent falls?  Wear shoes that:  Do not have high heels.  Have rubber bottoms.  Are comfortable and fit you well.  Are closed at the toe. Do not wear sandals.  If you use a stepladder:  Make sure that it is fully opened. Do not climb a closed stepladder.  Make sure that both sides of the stepladder are locked into place.  Ask someone to hold it for you, if possible.  Clearly mark and make sure that you can see:  Any grab bars or  handrails.  First and last steps.  Where the edge of each step is.  Use tools that help you move around (mobility aids) if they are needed. These include:  Canes.  Walkers.  Scooters.  Crutches.  Turn on the lights when you go into a dark area. Replace any light bulbs as soon as they burn out.  Set up your furniture so you have a clear path. Avoid moving your furniture around.  If any of your floors are uneven, fix them.  If there are any pets around you, be aware of where they are.  Review your medicines with your doctor. Some medicines can make you feel dizzy. This can increase your chance of falling. Ask your doctor what other things that you can do to help prevent falls. This information is not intended to replace advice given to you by your health care provider. Make sure you discuss any questions you have with your health care provider. Document Released: 01/19/2009 Document Revised: 08/31/2015 Document Reviewed: 04/29/2014 Elsevier Interactive Patient Education  2017 [employer].

## 2018-10-23 NOTE — Progress Notes (Signed)
Cardiology Office Note Date:  10/29/2018  Patient ID:  [name] [name], [name] [name] [birth year], MRN [id number] PCP:  [name] Curry, DO  Cardiologist:  Dr. [name]   Chief Complaint: CAD  History of Present Illness: [name] [name] is a 85 y.o. male with history of CAD (s/p anterior STEMI 04/2007 with BMS-> LAD, patent by cath in 12/2011, low risk nuc 04/2014), chronic dCHF, HTN, HLD, Crohn's disease, GERD, Parkinson's disease, cellulitis, orthostatic hypotension requiring midodrine, GERD, RBBB, fatty liver, gait disorder, chronic lower extremity edema who presents for follow up.   He was seen in the ED in May 2019 with left leg cellulitis. In September 2019 he was admitted with right leg cellulitis having failed outpatient antibiotics. Dopplers negative for DVT.   He is followed by Dr [name] [name] for his Parkinson's disease and memory loss.   He is seen with his wife today. He is now in skilled nursing at [facility]. He is doing really well. Still has difficulty walking. Denies any chest pain, dyspnea, swelling, fever.  Weight is stable.    Past Medical History:  Diagnosis Date  . Cellulitis   . Chronic diastolic CHF (congestive heart failure) (HCC)    a. EF initially 35-40% after MI 1/09; b. echo 7/08: EF 60%;  c. 11/2014 Echo: EF 65-70%, Gr 1 DD, mild MR.  . Coronary artery disease    a. s/p anterior STEMI 04/2007 with BMS-> LAD;  b. Cath 12/2011 patent stent;  c. low risk nuc 04/2014.  . Crohn's disease ([place])   . Diaphragmatic hernia without mention of obstruction or gangrene   . Diverticulosis of colon (without mention of hemorrhage)   . Fatty liver    a. on ultrasound of 10/2009  . Flatulence, eructation, and gas pain   . Fungal infection   . Gait disorder   . GERD (gastroesophageal reflux disease)   . HOH (hard of hearing)    Hearing aids  . Hyperlipidemia   . Hypertension   . Ischemic cardiomyopathy    a. EF initially 35-40% after MI 1/09; b. echo 7/08: EF 60%;  c. 11/2014 Echo: EF 65-70%,  Gr 1 DD, mild MR.  . Melanoma ([place])   . Memory disorder   . Orthostatic hypotension   . Osteoporosis   . Other chronic nonalcoholic liver disease   . Other esophagitis   . Parkinson's disease ([place])   . RBBB 09/02/2013  . Regional enteritis of small intestine ([place])   . Renal calculi   . Restless leg syndrome 03/22/2015  . 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 Urinary incontinence     Past Surgical History:  Procedure Laterality Date  . APPENDECTOMY    . BACK SURGERY     L3, L4, L5  . CARDIAC CATHETERIZATION  09/25/06   EF 35-40% but more recently 60%  . CATARACT EXTRACTION     Bilateral  . CHOLECYSTECTOMY    . Coronary artery stent placement    . Melanoma resection    . Partial bowel resection    . TONSILLECTOMY      Current Outpatient Medications  Medication Sig Dispense Refill  . acetaminophen (TYLENOL) 325 MG tablet Take 650 mg by mouth at bedtime. And q 6 hrs prn    . ADVAIR DISKUS 250-50 MCG/DOSE AEPB Inhale 1 puff into the lungs daily. Daily For shortness of breath    . aspirin EC 81 MG tablet Take 81 mg by mouth daily.    Marland Kitchen atorvastatin (LIPITOR) 40 MG  tablet Take 40 mg by mouth daily.    . calcium carbonate (CALCIUM 600) 600 MG TABS tablet Take 600 mg by mouth daily with breakfast.    . carbidopa-levodopa (SINEMET CR) 50-200 MG tablet Take 1 tablet by mouth 4 (four) times daily. 120 tablet 5  . Carbidopa-Levodopa ER (SINEMET CR) 25-100 MG tablet controlled release Take 0.5 tablets by mouth 4 (four) times daily.    . carvedilol (COREG) 3.125 MG tablet take one tablet ONLY if systolic BP greater than 094    . Cholecalciferol (VITAMIN D-3) 1000 UNITS CAPS Take 2,000 Units by mouth daily.     . cholestyramine (QUESTRAN) 4 g packet TAKE 1 PACKET BY MOUTH TWICE DAILY WITH A MEAL 60 each 11  . donepezil (ARICEPT) 10 MG tablet TAKE ONE TABLET AT BEDTIME 90 tablet 0  . ipratropium (ATROVENT) 0.03 % nasal spray Place 2 sprays into both nostrils 3 (three) times daily.     Marland Kitchen loperamide (IMODIUM A-D) 2 MG tablet Take 2 mg by mouth 2 (two) times daily as needed for diarrhea or loose stools.    . Melatonin 5 MG TABS Take 1 tablet by mouth at bedtime.     . midodrine (PROAMATINE) 5 MG tablet Take 1 tablet (5 mg total) by mouth 2 (two) times daily with a meal. 60 tablet 9  . Multiple Vitamins-Minerals (CENTRUM SILVER PO) Take 1 tablet by mouth daily.    . nitroGLYCERIN (NITROSTAT) 0.4 MG SL tablet Place 1 tablet (0.4 mg total) under the tongue every 5 (five) minutes as needed for chest pain. PT OVERDUE FOR OV PLEASE CALL FOR APPT 25 tablet 0  . pantoprazole (PROTONIX) 40 MG tablet Take 40 mg by mouth daily.     . potassium chloride SA (K-DUR,KLOR-CON) 20 MEQ tablet Take 40 mEq by mouth daily.    . Probiotic Product (ALIGN) 4 MG CAPS Take 1 capsule by mouth daily.     . sodium chloride (OCEAN) 0.65 % SOLN nasal spray Place 1 spray into both nostrils daily as needed for congestion.    . torsemide (DEMADEX) 20 MG tablet Take 1 tablet (20 mg total) by mouth daily. 180 tablet 3  . vitamin B-12 (CYANOCOBALAMIN) 1000 MCG tablet Take 1,000 mcg by mouth daily.     No current facility-administered medications for this visit.     Allergies:   Patient has no known allergies.   Social History:  The patient  reports that he has never smoked. He has never used smokeless tobacco. He reports current alcohol use of about 2.0 - 3.0 standard drinks of alcohol per week. He reports that he does not use drugs.   Family History:  The patient's family history includes Colon cancer in his brother; Coronary artery disease in his unknown relative; Emphysema in his father; Heart attack in his mother; Heart disease in his brother and brother; Heart failure in his mother; Hypertension in his mother.  ROS:  Please see the history of present illness.   All other systems are reviewed and otherwise negative.   PHYSICAL EXAM:  VS:  BP 106/74   Temp (!) 97.2 F (36.2 C)   Wt 177 lb 14.4 oz (80.7  kg)   BMI 27.05 kg/m  BMI: Body mass index is 27.05 kg/m. GENERAL:  Well appearing, elderly WM in NAD, seen in a wheelchair.  HEENT:  PERRL, EOMI, sclera are clear. Oropharynx is clear. NECK:  No jugular venous distention, carotid upstroke brisk and symmetric, no bruits, no thyromegaly  or adenopathy LUNGS:  Clear to auscultation bilaterally CHEST:  Unremarkable HEART:  RRR,  PMI not displaced or sustained,S1 and S2 within normal limits, no S3, no S4: no clicks, no rubs, no murmurs ABD:  Soft, nontender. Distended. BS +, no masses or bruits. No hepatomegaly, no splenomegaly EXT:  2 + pulses throughout, no edema or redness, no cyanosis no clubbing SKIN:  Warm and dry.  No rashes NEURO:  Alert and oriented x 3. Cranial nerves II through XII intact. PSYCH:  Cognitively intact  EKG:  Is done today. NSR rate 92. RBBB. LAD. I have personally reviewed and interpreted this study.    Recent Labs: 12/20/2017: Magnesium 2.0 02/09/2018: Hemoglobin 14.1; Platelets 304.0 08/24/2018: ALT 23; BUN 17; Creatinine 1.1; Potassium 4.1; Sodium 139  08/24/2018: Cholesterol 129; HDL 55; LDL Cholesterol 44; Triglycerides 153   CrCl cannot be calculated (Patient's most recent lab result is older than the maximum 21 days allowed.).    Wt Readings from Last 3 Encounters:  10/29/18 177 lb 14.4 oz (80.7 kg)  10/27/18 175 lb (79.4 kg)  10/22/18 175 lb 6.4 oz (79.6 kg)     Other studies reviewed: Additional studies/records reviewed today include:  Echo 11/14/15: Study Conclusions  - Left ventricle: The cavity size was normal. There was mild focal   basal and mild concentric hypertrophy of the septum. Systolic   function was vigorous. The estimated ejection fraction was in the   range of 65% to 70%. Wall motion was normal; there were no   regional wall motion abnormalities. Doppler parameters are   consistent with abnormal left ventricular relaxation (grade 1   diastolic dysfunction). - Mitral valve: There  was mild regurgitation.  Impressions:  - Compared to the prior study, there has been no significant   interval change.  ASSESSMENT AND PLAN:  1. Chronic diastolic CHF -well compensated. No edema, weight is stable. Continue current therapy.  2. History of essential HTN with orthostatic hypotension. BP well controlled on current regimen and he has no orthostatic symptoms.  3. CAD s/p anterior MI 2009 with BMS to LAD. Cath 2013 showed patent stent. Normal Myoview Jan 2016.  Asymptomatic. 4.   Parkinson's disease   Disposition: F/u in 6 months  Current medicines are reviewed at length with the patient today.  The patient did not have any concerns regarding medicines.  Signed, Saloni Lablanc [name] MD, [facility]    10/29/2018 11:35 AM     [facility]

## 2018-10-27 ENCOUNTER — Non-Acute Institutional Stay (SKILLED_NURSING_FACILITY): Payer: PPO | Admitting: Internal Medicine

## 2018-10-27 ENCOUNTER — Encounter: Payer: Self-pay | Admitting: Internal Medicine

## 2018-10-27 DIAGNOSIS — I951 Orthostatic hypotension: Secondary | ICD-10-CM

## 2018-10-27 DIAGNOSIS — F0280 Dementia in other diseases classified elsewhere without behavioral disturbance: Secondary | ICD-10-CM

## 2018-10-27 DIAGNOSIS — I5032 Chronic diastolic (congestive) heart failure: Secondary | ICD-10-CM

## 2018-10-27 DIAGNOSIS — K50918 Crohn's disease, unspecified, with other complication: Secondary | ICD-10-CM

## 2018-10-27 DIAGNOSIS — I25118 Atherosclerotic heart disease of native coronary artery with other forms of angina pectoris: Secondary | ICD-10-CM

## 2018-10-27 DIAGNOSIS — K7460 Unspecified cirrhosis of liver: Secondary | ICD-10-CM

## 2018-10-27 DIAGNOSIS — G20 Parkinson's disease: Secondary | ICD-10-CM

## 2018-10-27 DIAGNOSIS — G20A1 Parkinson's disease without dyskinesia, without mention of fluctuations: Secondary | ICD-10-CM

## 2018-10-27 NOTE — Progress Notes (Signed)
[name], male   DOB: 1928/11/01, 85 y.o.   MRN: [id number]  Location:  [place] Room Number: 163 Place of Service:  SNF ([phone]) Provider:   Gayland Curry, DO  Patient Care Team: Gayland Curry, DO as PCP - General (Geriatric Medicine) [name], [name], MD as Consulting Physician (Cardiology) [name], MD as Attending Physician (Gastroenterology) [name], MD as Consulting Physician (Neurology) Community, [facility] ([place]) Royal Hawthorn, NP as Nurse Practitioner (Nurse Practitioner)  Extended Emergency Contact Information Primary Emergency Contact: [name] Address: [place]          [name] Phone: [phone] Mobile Phone: [phone] Relation: Spouse  Code Status:  DNR Goals of care: Advanced Directive information Advanced Directives 10/22/2018  Does Patient Have a Medical Advance Directive? Yes  Type of Paramedic of [place];Living will  Does patient want to make changes to medical advance directive? No - Patient declined  Copy of [place] in Chart? Yes - validated most recent copy scanned in chart (See row information)  Pre-existing out of facility DNR order (yellow form or pink MOST form) -     Chief Complaint  Patient presents with  . Medical Management of Chronic Issues    Routine Visit    HPI:  Pt is a 85 y.o. male seen today for medical management of chronic diseases.    When seen, he was engrossed in his game show and not interested in our visit.  He had no complaints and staff had no reported concerns about him either.    He denied changes with his bowels recently which are typically a concern.  He denies pain in his neck and joints.  He is sleeping well at night.  He is eating well.   Past Medical History:  Diagnosis Date  . Cellulitis   . Chronic diastolic CHF  (congestive heart failure) (HCC)    a. EF initially 35-40% after MI 1/09; b. echo 7/08: EF 60%;  c. 11/2014 Echo: EF 65-70%, Gr 1 DD, mild MR.  . Coronary artery disease    a. s/p anterior STEMI 04/2007 with BMS-> LAD;  b. Cath 12/2011 patent stent;  c. low risk nuc 04/2014.  . Crohn's disease ([place])   . Diaphragmatic hernia without mention of obstruction or gangrene   . Diverticulosis of colon (without mention of hemorrhage)   . Fatty liver    a. on ultrasound of 10/2009  . Flatulence, eructation, and gas pain   . Fungal infection   . Gait disorder   . GERD (gastroesophageal reflux disease)   . HOH (hard of hearing)    Hearing aids  . Hyperlipidemia   . Hypertension   . Ischemic cardiomyopathy    a. EF initially 35-40% after MI 1/09; b. echo 7/08: EF 60%;  c. 11/2014 Echo: EF 65-70%, Gr 1 DD, mild MR.  . Melanoma ([facility])   . Memory disorder   . Orthostatic hypotension   . Osteoporosis   . Other chronic nonalcoholic liver disease   . Other esophagitis   . Parkinson's disease ([place])   . RBBB 09/02/2013  . Regional enteritis of small intestine ([place])   . Renal calculi   . Restless leg syndrome 03/22/2015  . Ulcerative (chronic) ileocolitis ([place])   . Urinary incontinence    Past Surgical History:  Procedure Laterality Date  . APPENDECTOMY    .  BACK SURGERY     L3, L4, L5  . CARDIAC CATHETERIZATION  09/25/06   EF 35-40% but more recently 60%  . CATARACT EXTRACTION     Bilateral  . CHOLECYSTECTOMY    . Coronary artery stent placement    . Melanoma resection    . Partial bowel resection    . TONSILLECTOMY      No Known Allergies  Outpatient Encounter Medications as of 10/27/2018  Medication Sig  . acetaminophen (TYLENOL) 325 MG tablet Take 650 mg by mouth at bedtime. And q 6 hrs prn  . ADVAIR DISKUS 250-50 MCG/DOSE AEPB Inhale 1 puff into the lungs daily. Daily For shortness of breath  . aspirin EC 81 MG tablet Take 81 mg by mouth daily.  Marland Kitchen atorvastatin (LIPITOR) 40 MG tablet Take  40 mg by mouth daily.  . calcium carbonate (CALCIUM 600) 600 MG TABS tablet Take 600 mg by mouth daily with breakfast.  . carbidopa-levodopa (SINEMET CR) 50-200 MG tablet Take 1 tablet by mouth 4 (four) times daily.  . Carbidopa-Levodopa ER (SINEMET CR) 25-100 MG tablet controlled release Take 0.5 tablets by mouth 4 (four) times daily.  . carvedilol (COREG) 3.125 MG tablet take one tablet ONLY if systolic BP greater than 970  . Cholecalciferol (VITAMIN D-3) 1000 UNITS CAPS Take 2,000 Units by mouth daily.   . cholestyramine (QUESTRAN) 4 g packet TAKE 1 PACKET BY MOUTH TWICE DAILY WITH A MEAL  . donepezil (ARICEPT) 10 MG tablet TAKE ONE TABLET AT BEDTIME  . ipratropium (ATROVENT) 0.03 % nasal spray Place 2 sprays into both nostrils 3 (three) times daily.  Marland Kitchen loperamide (IMODIUM A-D) 2 MG tablet Take 2 mg by mouth 2 (two) times daily as needed for diarrhea or loose stools.  . Melatonin 5 MG TABS Take 1 tablet by mouth at bedtime.   . midodrine (PROAMATINE) 5 MG tablet Take 1 tablet (5 mg total) by mouth 2 (two) times daily with a meal.  . Multiple Vitamins-Minerals (CENTRUM SILVER PO) Take 1 tablet by mouth daily.  . nitroGLYCERIN (NITROSTAT) 0.4 MG SL tablet Place 1 tablet (0.4 mg total) under the tongue every 5 (five) minutes as needed for chest pain. PT OVERDUE FOR OV PLEASE CALL FOR APPT  . pantoprazole (PROTONIX) 40 MG tablet Take 40 mg by mouth daily.   . potassium chloride SA (K-DUR,KLOR-CON) 20 MEQ tablet Take 40 mEq by mouth daily.  . Probiotic Product (ALIGN) 4 MG CAPS Take 1 capsule by mouth daily.   . sodium chloride (OCEAN) 0.65 % SOLN nasal spray Place 1 spray into both nostrils daily as needed for congestion.  . torsemide (DEMADEX) 20 MG tablet Take 1 tablet (20 mg total) by mouth daily.  . vitamin B-12 (CYANOCOBALAMIN) 1000 MCG tablet Take 1,000 mcg by mouth daily.   No facility-administered encounter medications on file as of 10/27/2018.     Review of Systems  Constitutional:  Negative for activity change, appetite change, chills, fatigue, fever and unexpected weight change.  HENT: Negative for congestion and trouble swallowing.   Eyes: Negative for visual disturbance.  Respiratory: Negative for cough and shortness of breath.   Cardiovascular: Negative for chest pain, palpitations and leg swelling.  Gastrointestinal: Negative for abdominal pain, blood in stool, constipation, nausea and vomiting.  Genitourinary: Negative for dysuria.  Musculoskeletal: Positive for gait problem. Negative for arthralgias.  Skin: Negative for color change.  Allergic/Immunologic: Negative for environmental allergies.  Neurological: Negative for dizziness and weakness.  Hematological: Bruises/bleeds easily.  Psychiatric/Behavioral: Positive for confusion. Negative for agitation and sleep disturbance. The patient is not nervous/anxious.     Immunization History  Administered Date(s) Administered  . Influenza-Unspecified 12/06/2014, 11/27/2017  . Pneumococcal Conjugate-13 05/21/2018  . Pneumococcal Polysaccharide-23 05/21/2005  . Tdap 06/03/2006   Pertinent  Health Maintenance Due  Topic Date Due  . INFLUENZA VACCINE  11/07/2018  . PNA vac Low Risk Adult  Completed   Fall Risk  10/22/2018 05/12/2018 10/03/2017 12/29/2014  Falls in the past year? 1 1 No Yes  Number falls in past yr: 1 1 - 1  Injury with Fall? 1 0 - No  Risk for fall due to : History of fall(s);Impaired mobility Impaired mobility;History of fall(s);Impaired balance/gait;Mental status change;Medication side effect - Impaired balance/gait  Follow up Falls evaluation completed;Falls prevention discussed Falls evaluation completed;Falls prevention discussed;Education provided - Falls evaluation completed       Vitals:   10/27/18 1143  BP: 122/70  Pulse: 85  Resp: 20  Temp: 97.8 F (36.6 C)  TempSrc: Oral  SpO2: 93%  Weight: 175 lb (79.4 kg)  Height: 5' 8"  (1.727 m)   Body mass index is 26.61 kg/m. Physical  Exam Vitals signs reviewed.  Constitutional:      General: He is not in acute distress.    Appearance: Normal appearance. He is normal weight. He is not toxic-appearing.  HENT:     Head: Normocephalic and atraumatic.  Neck:     Musculoskeletal: Neck supple.  Cardiovascular:     Rate and Rhythm: Normal rate and regular rhythm.     Pulses: Normal pulses.     Heart sounds: Normal heart sounds.  Pulmonary:     Effort: Pulmonary effort is normal.     Breath sounds: Normal breath sounds.  Abdominal:     General: Bowel sounds are normal.     Palpations: Abdomen is soft. There is no mass.     Tenderness: There is no abdominal tenderness. There is no guarding or rebound.  Musculoskeletal: Normal range of motion.     Right lower leg: No edema.     Left lower leg: No edema.  Skin:    General: Skin is warm and dry.     Capillary Refill: Capillary refill takes less than 2 seconds.  Neurological:     General: No focal deficit present.     Mental Status: He is alert. Mental status is at baseline.     Cranial Nerves: No cranial nerve deficit.     Gait: Gait abnormal.  Psychiatric:        Mood and Affect: Mood normal.        Behavior: Behavior normal.     Labs reviewed: Recent Labs    12/19/17 0518 12/20/17 0522 02/09/18 02/09/18 1251 08/24/18  NA 137 139 139 139 139  K 3.4* 3.5 3.6 3.6 4.1  CL 100 99  --  97  --   CO2 22 28  --  31  --   GLUCOSE 116* 117*  --  127*  --   BUN 18 21 26* 26* 17  CREATININE 1.03 1.06 1.3 1.26 1.1  CALCIUM 8.3* 8.7*  --  8.6  --   MG 1.1* 2.0  --   --   --   PHOS  --  3.2  --   --   --    Recent Labs    12/17/17 2044 12/18/17 0533 08/24/18  AST 28 21 48*  ALT 7 13 23   ALKPHOS 83  65 88  BILITOT 2.3* 1.8*  --   PROT 8.6* 6.7  --   ALBUMIN 4.3 3.3*  --    Recent Labs    12/17/17 2044  12/19/17 0518 12/20/17 0522 02/09/18 02/09/18 1251  WBC 18.5*   < > 12.0* 9.9 9.6 9.6  NEUTROABS 16.7*  --   --   --   --   --   HGB 13.1   < > 12.1*  12.3*  --  14.1  HCT 39.7   < > 36.5* 37.6*  --  41.8  MCV 94.7   < > 95.3 94.7  --  92.7  PLT 232   < > 201 226  --  304.0   < > = values in this interval not displayed.   Lab Results  Component Value Date   TSH 2.428 11/12/2014   No results found for: HGBA1C Lab Results  Component Value Date   CHOL 129 08/24/2018   HDL 55 08/24/2018   LDLCALC 44 08/24/2018   TRIG 153 08/24/2018    Assessment/Plan 1. Parkinson disease ([place]) -continues power chair use -on sinemet -no recent changes neurologically  2. 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 Atherosclerosis of native coronary artery of native heart with other form of angina pectoris ([place]) -no recent difficulty with chest pain or sob, continues on asa 51m, coreg  4. Chronic diastolic CHF (congestive heart failure) (HCC) -cont coreg, torsemide  5. Orthostatic hypotension -no recent falls related to this -continues on midodrine therapy  6. Crohn's disease with other complication, unspecified gastrointestinal tract location ([facility] -no recent bowel changes or concerns today -has bid prn imodium, align probiotic, questran bulking agent  7. Dementia due to Parkinson's disease without behavioral disturbance ([place] -continue aricept therapy   Family/ staff Communication: nursing reported no concerns about resident  Labs/tests ordered:  No new  Eloise Picone L. Anselmo Reihl, D.O. [facility] [address] [place] [id number] Phone (Mon-Fri 8am-5pm):  [phone] Call:  [phone]& follow prompts after 5pm & weekends Office Phone:  [phone] Fax:  [phone]

## 2018-10-28 ENCOUNTER — Telehealth: Payer: Self-pay | Admitting: Cardiology

## 2018-10-28 NOTE — Telephone Encounter (Signed)
I called pt to confirm his 10-29-18 appt with Dr [name].

## 2018-10-29 ENCOUNTER — Encounter: Payer: Self-pay | Admitting: Cardiology

## 2018-10-29 ENCOUNTER — Ambulatory Visit (INDEPENDENT_AMBULATORY_CARE_PROVIDER_SITE_OTHER): Payer: PPO | Admitting: Cardiology

## 2018-10-29 ENCOUNTER — Other Ambulatory Visit: Payer: Self-pay

## 2018-10-29 VITALS — BP 106/74 | Temp 97.2°F | Wt 177.9 lb

## 2018-10-29 DIAGNOSIS — I5032 Chronic diastolic (congestive) heart failure: Secondary | ICD-10-CM | POA: Diagnosis not present

## 2018-10-29 DIAGNOSIS — I951 Orthostatic hypotension: Secondary | ICD-10-CM | POA: Diagnosis not present

## 2018-10-29 DIAGNOSIS — I10 Essential (primary) hypertension: Secondary | ICD-10-CM

## 2018-10-29 DIAGNOSIS — I2510 Atherosclerotic heart disease of native coronary artery without angina pectoris: Secondary | ICD-10-CM

## 2018-10-29 NOTE — Patient Instructions (Addendum)
Continue your current therapy  Follow up in 6 months

## 2018-11-02 DIAGNOSIS — B351 Tinea unguium: Secondary | ICD-10-CM | POA: Diagnosis not present

## 2018-11-19 ENCOUNTER — Non-Acute Institutional Stay (SKILLED_NURSING_FACILITY): Payer: PPO | Admitting: Adult Health

## 2018-11-19 DIAGNOSIS — K7581 Nonalcoholic steatohepatitis (NASH): Secondary | ICD-10-CM

## 2018-11-19 DIAGNOSIS — K50918 Crohn's disease, unspecified, with other complication: Secondary | ICD-10-CM | POA: Diagnosis not present

## 2018-11-19 DIAGNOSIS — I2510 Atherosclerotic heart disease of native coronary artery without angina pectoris: Secondary | ICD-10-CM

## 2018-11-19 DIAGNOSIS — I5032 Chronic diastolic (congestive) heart failure: Secondary | ICD-10-CM

## 2018-11-19 DIAGNOSIS — I10 Essential (primary) hypertension: Secondary | ICD-10-CM | POA: Diagnosis not present

## 2018-11-19 DIAGNOSIS — M25571 Pain in right ankle and joints of right foot: Secondary | ICD-10-CM

## 2018-11-19 DIAGNOSIS — E782 Mixed hyperlipidemia: Secondary | ICD-10-CM

## 2018-11-19 DIAGNOSIS — F0280 Dementia in other diseases classified elsewhere without behavioral disturbance: Secondary | ICD-10-CM | POA: Diagnosis not present

## 2018-11-19 DIAGNOSIS — N3945 Continuous leakage: Secondary | ICD-10-CM

## 2018-11-19 DIAGNOSIS — K2210 Ulcer of esophagus without bleeding: Secondary | ICD-10-CM

## 2018-11-19 DIAGNOSIS — I951 Orthostatic hypotension: Secondary | ICD-10-CM

## 2018-11-19 DIAGNOSIS — G20 Parkinson's disease: Secondary | ICD-10-CM | POA: Diagnosis not present

## 2018-11-19 NOTE — Progress Notes (Signed)
Provider:   Cindi Carbon, ANP [place] [phone]  Location: [place] of Service:  SNF (31)   PCP: Gayland Curry, DO Patient Care Team: Gayland Curry, DO as PCP - General (Geriatric Medicine) [name], [name], MD as Consulting Physician (Cardiology) [name], MD as Attending Physician (Gastroenterology) [name], MD as Consulting Physician (Neurology) Community, [facility] ([place]) Royal Hawthorn, NP as Nurse Practitioner (Nurse Practitioner)  Extended Emergency Contact Information Primary Emergency Contact: [name] Address: [address]          [name] Phone: [phone] Mobile Phone: [phone] Relation: Spouse  Code Status: DNR Goals of Care: Advanced Directive information Advanced Directives 10/22/2018  Does Patient Have a Medical Advance Directive? Yes  Type of Paramedic of [place];Living will  Does patient want to make changes to medical advance directive? No - Patient declined  Copy of [place] in Chart? Yes - validated most recent copy scanned in chart (See row information)  Pre-existing out of facility DNR order (yellow form or pink MOST form) -     Chief Complaint  Patient presents with  . Annual Exam    HPI: Patient is a 85 y.o. male seen today for an annual comprehensive examination. He reports that his right ankle is hurting and he is requesting a wrap. He does not know how he hurt it. Nsg notes indicate that he did fall on 8/5 but at that time no injury was noted. He has not had any swelling or bruising. He is not ambulatory but transfers himself into the chair at times.   He denies any GI upset, n, v, d, or abd pain  No sob, cp, edema, or weight gain  Denies any depressive symptoms, sleep issue or appetite issues  Vitals and weight are stable.    Past  Medical History:  Diagnosis Date  . Cellulitis   . Chronic diastolic CHF (congestive heart failure) (HCC)    a. EF initially 35-40% after MI 1/09; b. echo 7/08: EF 60%;  c. 11/2014 Echo: EF 65-70%, Gr 1 DD, mild MR.  . Coronary artery disease    a. s/p anterior STEMI 04/2007 with BMS-> LAD;  b. Cath 12/2011 patent stent;  c. low risk nuc 04/2014.  . Crohn's disease ([place])   . Diaphragmatic hernia without mention of obstruction or gangrene   . Diverticulosis of colon (without mention of hemorrhage)   . Fatty liver    a. on ultrasound of 10/2009  . Flatulence, eructation, and gas pain   . Fungal infection   . Gait disorder   . GERD (gastroesophageal reflux disease)   . HOH (hard of hearing)    Hearing aids  . Hyperlipidemia   . Hypertension   . Ischemic cardiomyopathy    a. EF initially 35-40% after MI 1/09; b. echo 7/08: EF 60%;  c. 11/2014 Echo: EF 65-70%, Gr 1 DD, mild MR.  . Melanoma ([place])   . Memory disorder   . Orthostatic hypotension   . Osteoporosis   . Other chronic nonalcoholic liver disease   . Other esophagitis   . Parkinson's disease ([place])   . RBBB 09/02/2013  . Regional enteritis of small intestine ([place])   . Renal calculi   . Restless leg syndrome 03/22/2015  . 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 Urinary incontinence    Past Surgical History:  Procedure  Laterality Date  . APPENDECTOMY    . BACK SURGERY     L3, L4, L5  . CARDIAC CATHETERIZATION  09/25/06   EF 35-40% but more recently 60%  . CATARACT EXTRACTION     Bilateral  . CHOLECYSTECTOMY    . Coronary artery stent placement    . Melanoma resection    . Partial bowel resection    . TONSILLECTOMY      reports that he has never smoked. He has never used smokeless tobacco. He reports current alcohol use of about 2.0 - 3.0 standard drinks of alcohol per week. He reports that he does not use drugs. Social History   Socioeconomic History  . Marital status: Married    Spouse name: [name]  . Number of  children: 3  . Years of education: 16  . Highest education level: Bachelor's degree (e.g., BA, AB, BS)  Occupational History  . Occupation: CEO-retired    Employer: [employer]  . Occupation: Licensed conveyancer: [employer]  . Financial resource strain: Not hard at all  . Food insecurity    Worry: Never true    Inability: Never true  . Transportation needs    Medical: No    Non-medical: No  Tobacco Use  . Smoking status: Never Smoker  . Smokeless tobacco: Never Used  Substance and Sexual Activity  . Alcohol use: Yes    Alcohol/week: 2.0 - 3.0 standard drinks    Types: 2 - 3 Standard drinks or equivalent per week    Comment: occassionally  . Drug use: No  . Sexual activity: Not Currently  Lifestyle  . Physical activity    Days per week: 0 days    Minutes per session: 0 min  . Stress: Not at all  Relationships  . Social connections    Talks on phone: More than three times a week    Gets together: More than three times a week    Attends religious service: Never    Active member of club or organization: No    Attends meetings of clubs or organizations: Never    Relationship status: Married  Other Topics Concern  . Not on file  Social History Narrative   Lives w/ his wife at [place]   Patient is right handed.   Patient drinks very little caffeine.   Family History  Problem Relation Age of Onset  . Heart failure Mother   . Heart attack Mother   . Hypertension Mother   . Emphysema Father   . Heart disease Brother   . Heart disease Brother   . Coronary artery disease Unknown   . Colon cancer Brother        older at dx  . Stomach cancer Neg Hx     Pertinent  Health Maintenance Due  Topic Date Due  . INFLUENZA VACCINE  11/07/2018  . PNA vac Low Risk Adult  Completed   Fall Risk  10/22/2018 05/12/2018 10/03/2017 12/29/2014  Falls in the past year? 1 1 No Yes  Number falls in past yr: 1 1 - 1  Injury with Fall? 1 0 - No   Risk for fall due to : History of fall(s);Impaired mobility Impaired mobility;History of fall(s);Impaired balance/gait;Mental status change;Medication side effect - Impaired balance/gait  Follow up Falls evaluation completed;Falls prevention discussed Falls evaluation completed;Falls prevention discussed;Education provided - Falls evaluation completed   Depression screen [facility] 2/9 10/22/2018 05/20/2018 12/18/2017 12/29/2014  Decreased Interest 0 0  0 0  Down, Depressed, Hopeless 0 0 0 0  PHQ - 2 Score 0 0 0 0    Functional Status Survey:    No Known Allergies  Outpatient Encounter Medications as of 11/19/2018  Medication Sig  . acetaminophen (TYLENOL) 325 MG tablet Take 650 mg by mouth at bedtime. And q 6 hrs prn  . ADVAIR DISKUS 250-50 MCG/DOSE AEPB Inhale 1 puff into the lungs daily. Daily For shortness of breath  . aspirin EC 81 MG tablet Take 81 mg by mouth daily.  Marland Kitchen atorvastatin (LIPITOR) 40 MG tablet Take 40 mg by mouth daily.  . calcium carbonate (CALCIUM 600) 600 MG TABS tablet Take 600 mg by mouth daily with breakfast.  . carbidopa-levodopa (SINEMET CR) 50-200 MG tablet Take 1 tablet by mouth 4 (four) times daily.  . Carbidopa-Levodopa ER (SINEMET CR) 25-100 MG tablet controlled release Take 0.5 tablets by mouth 4 (four) times daily.  . carvedilol (COREG) 3.125 MG tablet take one tablet ONLY if systolic BP greater than 382  . Cholecalciferol (VITAMIN D-3) 1000 UNITS CAPS Take 2,000 Units by mouth daily.   . cholestyramine (QUESTRAN) 4 g packet TAKE 1 PACKET BY MOUTH TWICE DAILY WITH A MEAL  . donepezil (ARICEPT) 10 MG tablet TAKE ONE TABLET AT BEDTIME  . ipratropium (ATROVENT) 0.03 % nasal spray Place 2 sprays into both nostrils 3 (three) times daily.  Marland Kitchen loperamide (IMODIUM A-D) 2 MG tablet Take 2 mg by mouth 2 (two) times daily as needed for diarrhea or loose stools.  . Melatonin 5 MG TABS Take 1 tablet by mouth at bedtime.   . midodrine (PROAMATINE) 5 MG tablet Take 1 tablet (5 mg  total) by mouth 2 (two) times daily with a meal.  . Multiple Vitamins-Minerals (CENTRUM SILVER PO) Take 1 tablet by mouth daily.  . nitroGLYCERIN (NITROSTAT) 0.4 MG SL tablet Place 1 tablet (0.4 mg total) under the tongue every 5 (five) minutes as needed for chest pain. PT OVERDUE FOR OV PLEASE CALL FOR APPT  . pantoprazole (PROTONIX) 40 MG tablet Take 40 mg by mouth daily.   . potassium chloride SA (K-DUR,KLOR-CON) 20 MEQ tablet Take 40 mEq by mouth daily.  . Probiotic Product (ALIGN) 4 MG CAPS Take 1 capsule by mouth daily.   . sodium chloride (OCEAN) 0.65 % SOLN nasal spray Place 1 spray into both nostrils daily as needed for congestion.  . torsemide (DEMADEX) 20 MG tablet Take 1 tablet (20 mg total) by mouth daily.  . vitamin B-12 (CYANOCOBALAMIN) 1000 MCG tablet Take 1,000 mcg by mouth daily.   No facility-administered encounter medications on file as of 11/19/2018.     Review of Systems  Constitutional: Negative for activity change, appetite change, chills, diaphoresis, fatigue, fever and unexpected weight change.  Eyes: Negative for visual disturbance.  Respiratory: Negative for cough, shortness of breath, wheezing and stridor.   Cardiovascular: Negative for chest pain, palpitations and leg swelling.  Gastrointestinal: Negative for abdominal distention, abdominal pain, constipation and diarrhea.  Endocrine: Negative for polyuria.  Genitourinary: Negative for difficulty urinating and dysuria.  Musculoskeletal: Positive for arthralgias and gait problem. Negative for back pain, joint swelling and myalgias.       Right ankle pain  Neurological: Negative for dizziness, seizures, syncope, facial asymmetry, speech difficulty, weakness and headaches.  Hematological: Negative for adenopathy. Does not bruise/bleed easily.  Psychiatric/Behavioral: Negative for agitation, behavioral problems and confusion.    Vitals:   11/19/18 1445  Weight: 176 lb 3.2 oz (79.9  kg)   Body mass index is  26.79 kg/m. Physical Exam Vitals signs and nursing note reviewed.  Constitutional:      General: He is not in acute distress.    Appearance: He is not diaphoretic.  HENT:     Head: Normocephalic and atraumatic.     Right Ear: Tympanic membrane normal.     Left Ear: Tympanic membrane and ear canal normal.     Nose: Nose normal. No congestion.     Mouth/Throat:     Mouth: Mucous membranes are moist.     Pharynx: Oropharynx is clear. No oropharyngeal exudate.  Eyes:     Conjunctiva/sclera: Conjunctivae normal.     Pupils: Pupils are equal, round, and reactive to light.  Neck:     Thyroid: No thyromegaly.     Vascular: No JVD.     Trachea: No tracheal deviation.  Cardiovascular:     Rate and Rhythm: Normal rate and regular rhythm.     Heart sounds: No murmur.  Pulmonary:     Effort: Pulmonary effort is normal. No respiratory distress.     Breath sounds: Normal breath sounds. No wheezing.  Abdominal:     General: Bowel sounds are normal. There is no distension.     Palpations: Abdomen is soft.     Tenderness: There is no abdominal tenderness.  Musculoskeletal:     Right ankle: He exhibits normal range of motion, no swelling, no ecchymosis, no deformity, no laceration and normal pulse. Tenderness. Lateral malleolus tenderness found. No medial malleolus, no AITFL, no CF ligament, no posterior TFL, no head of 5th metatarsal and no proximal fibula tenderness found.     Right lower leg: Edema (trace with bluish discoloration ) present.     Left lower leg: Edema (trace with bluish discoloration ) present.  Lymphadenopathy:     Cervical: No cervical adenopathy.  Skin:    General: Skin is warm and dry.  Neurological:     General: No focal deficit present.     Mental Status: He is alert and oriented to person, place, and time.     Cranial Nerves: No cranial nerve deficit.  Psychiatric:        Mood and Affect: Mood normal.     Labs reviewed: Basic Metabolic Panel: Recent Labs     12/19/17 0518 12/20/17 0522 02/09/18 02/09/18 1251 08/24/18  NA 137 139 139 139 139  K 3.4* 3.5 3.6 3.6 4.1  CL 100 99  --  97  --   CO2 22 28  --  31  --   GLUCOSE 116* 117*  --  127*  --   BUN 18 21 26* 26* 17  CREATININE 1.03 1.06 1.3 1.26 1.1  CALCIUM 8.3* 8.7*  --  8.6  --   MG 1.1* 2.0  --   --   --   PHOS  --  3.2  --   --   --    Liver Function Tests: Recent Labs    12/17/17 2044 12/18/17 0533 08/24/18  AST 28 21 48*  ALT 7 13 23   ALKPHOS 83 65 88  BILITOT 2.3* 1.8*  --   PROT 8.6* 6.7  --   ALBUMIN 4.3 3.3*  --    No results for input(s): LIPASE, AMYLASE in the last 8760 hours. No results for input(s): AMMONIA in the last 8760 hours. CBC: Recent Labs    12/17/17 2044  12/19/17 0518 12/20/17 0522 02/09/18 02/09/18 1251  WBC 18.5*   < >  12.0* 9.9 9.6 9.6  NEUTROABS 16.7*  --   --   --   --   --   HGB 13.1   < > 12.1* 12.3*  --  14.1  HCT 39.7   < > 36.5* 37.6*  --  41.8  MCV 94.7   < > 95.3 94.7  --  92.7  PLT 232   < > 201 226  --  304.0   < > = values in this interval not displayed.   Cardiac Enzymes: No results for input(s): CKTOTAL, CKMB, CKMBINDEX, TROPONINI in the last 8760 hours. BNP: Invalid input(s): POCBNP No results found for: HGBA1C Lab Results  Component Value Date   TSH 2.428 11/12/2014   Lab Results  Component Value Date   VITAMINB12 469 12/13/2014   Lab Results  Component Value Date   FOLATE > 20.0 ng/mL 06/12/2006   No results found for: IRON, TIBC, FERRITIN  Imaging and Procedures obtained recently: No results found.  Assessment/Plan 1. Dementia due to Parkinson's disease without behavioral disturbance ([place]) MMSE 29/30 10/22/18, unchanged cognition in the past year Continue Aricept  2. Chronic diastolic CHF (congestive heart failure) (HCC) Compensated Continue torsemide 20 mg qd  3. Coronary artery disease involving native coronary artery of native heart without angina pectoris Continue asa 81 mg qd   4. Essential  hypertension Controlled, continue prn coreg.   5. Orthostatic hypotension Has not been an issue Continue midodrine 5 mg bid  6. Crohn's disease with other complication, unspecified gastrointestinal tract location ([place]) Controlled with Questran   7. Erosive esophagitis Continue Protonix 40 mg qd would not taper due to hx   8. NASH (nonalcoholic steatohepatitis) Stable at this time.  Continue to monitor LFTS periodically   9. Parkinson disease (HCC) Continue Sinemet 1 tab QID  10. Continuous leakage of urine Incontinence care provided by [facility] and q 2hr toileting   11. Mixed hyperlipidemia LDL 44 Continue lipitor  12. Acute right ankle pain Mild sprain Ace wrap to right ankle x 3 days . Report to [facility] if no improvement.  May use tylenol as needed and ice for pain.     Family/ staff Communication: staff and resident   Labs/tests ordered:NA

## 2018-11-20 ENCOUNTER — Encounter: Payer: Self-pay | Admitting: Adult Health

## 2018-12-09 DIAGNOSIS — I950 Idiopathic hypotension: Secondary | ICD-10-CM | POA: Diagnosis not present

## 2018-12-09 DIAGNOSIS — W1839XD Other fall on same level, subsequent encounter: Secondary | ICD-10-CM | POA: Diagnosis not present

## 2018-12-09 DIAGNOSIS — R296 Repeated falls: Secondary | ICD-10-CM | POA: Diagnosis not present

## 2018-12-09 DIAGNOSIS — I5032 Chronic diastolic (congestive) heart failure: Secondary | ICD-10-CM | POA: Diagnosis not present

## 2018-12-09 DIAGNOSIS — R41840 Attention and concentration deficit: Secondary | ICD-10-CM | POA: Diagnosis not present

## 2018-12-09 DIAGNOSIS — I951 Orthostatic hypotension: Secondary | ICD-10-CM | POA: Diagnosis not present

## 2018-12-09 DIAGNOSIS — G20 Parkinson's disease: Secondary | ICD-10-CM | POA: Diagnosis not present

## 2018-12-10 DIAGNOSIS — R41840 Attention and concentration deficit: Secondary | ICD-10-CM | POA: Diagnosis not present

## 2018-12-10 DIAGNOSIS — G20 Parkinson's disease: Secondary | ICD-10-CM | POA: Diagnosis not present

## 2018-12-10 DIAGNOSIS — I5032 Chronic diastolic (congestive) heart failure: Secondary | ICD-10-CM | POA: Diagnosis not present

## 2018-12-10 DIAGNOSIS — I951 Orthostatic hypotension: Secondary | ICD-10-CM | POA: Diagnosis not present

## 2018-12-10 DIAGNOSIS — I950 Idiopathic hypotension: Secondary | ICD-10-CM | POA: Diagnosis not present

## 2018-12-10 DIAGNOSIS — W1839XD Other fall on same level, subsequent encounter: Secondary | ICD-10-CM | POA: Diagnosis not present

## 2018-12-10 DIAGNOSIS — R296 Repeated falls: Secondary | ICD-10-CM | POA: Diagnosis not present

## 2018-12-15 DIAGNOSIS — I950 Idiopathic hypotension: Secondary | ICD-10-CM | POA: Diagnosis not present

## 2018-12-15 DIAGNOSIS — I5032 Chronic diastolic (congestive) heart failure: Secondary | ICD-10-CM | POA: Diagnosis not present

## 2018-12-15 DIAGNOSIS — W1839XD Other fall on same level, subsequent encounter: Secondary | ICD-10-CM | POA: Diagnosis not present

## 2018-12-15 DIAGNOSIS — R296 Repeated falls: Secondary | ICD-10-CM | POA: Diagnosis not present

## 2018-12-15 DIAGNOSIS — G20 Parkinson's disease: Secondary | ICD-10-CM | POA: Diagnosis not present

## 2018-12-15 DIAGNOSIS — R41840 Attention and concentration deficit: Secondary | ICD-10-CM | POA: Diagnosis not present

## 2018-12-15 DIAGNOSIS — I951 Orthostatic hypotension: Secondary | ICD-10-CM | POA: Diagnosis not present

## 2018-12-16 ENCOUNTER — Encounter: Payer: Self-pay | Admitting: Neurology

## 2018-12-16 ENCOUNTER — Other Ambulatory Visit: Payer: Self-pay

## 2018-12-16 ENCOUNTER — Ambulatory Visit (INDEPENDENT_AMBULATORY_CARE_PROVIDER_SITE_OTHER): Payer: PPO | Admitting: Neurology

## 2018-12-16 VITALS — BP 110/78 | HR 98 | Temp 97.8°F

## 2018-12-16 DIAGNOSIS — G20 Parkinson's disease: Secondary | ICD-10-CM | POA: Diagnosis not present

## 2018-12-16 DIAGNOSIS — W1839XD Other fall on same level, subsequent encounter: Secondary | ICD-10-CM | POA: Diagnosis not present

## 2018-12-16 DIAGNOSIS — R269 Unspecified abnormalities of gait and mobility: Secondary | ICD-10-CM

## 2018-12-16 DIAGNOSIS — F0280 Dementia in other diseases classified elsewhere without behavioral disturbance: Secondary | ICD-10-CM | POA: Diagnosis not present

## 2018-12-16 DIAGNOSIS — B488 Other specified mycoses: Secondary | ICD-10-CM

## 2018-12-16 DIAGNOSIS — G2581 Restless legs syndrome: Secondary | ICD-10-CM

## 2018-12-16 DIAGNOSIS — I951 Orthostatic hypotension: Secondary | ICD-10-CM | POA: Diagnosis not present

## 2018-12-16 DIAGNOSIS — R296 Repeated falls: Secondary | ICD-10-CM | POA: Diagnosis not present

## 2018-12-16 DIAGNOSIS — R41840 Attention and concentration deficit: Secondary | ICD-10-CM | POA: Diagnosis not present

## 2018-12-16 DIAGNOSIS — I5032 Chronic diastolic (congestive) heart failure: Secondary | ICD-10-CM | POA: Diagnosis not present

## 2018-12-16 DIAGNOSIS — I950 Idiopathic hypotension: Secondary | ICD-10-CM | POA: Diagnosis not present

## 2018-12-16 NOTE — Progress Notes (Signed)
Reason for visit: Parkinson's disease  [name] [name] is an 85 y.o. male  History of present illness:  [name] [name] is an 85 year old right-handed white male with a history of Parkinson's disease.  He has had a decline in his ability to ambulate since the COVID virus pandemic.  The patient has fallen on occasion, he uses a walker to get around.  He is getting physical therapy twice a week to maintain his physical abilities.  He has not sustained any injuries with the falls.  He is on Sinemet CR 50/200 mg tablets taking 1 tablet 4 times daily and he takes one half of a Sinemet 25/100 mg tablet 4 times daily with the CR.  He takes Aricept 10 mg daily for memory.  He is having a runny nose on the medication but otherwise tolerates the drug.  He is on ProAmatine for orthostatic hypotension, he has not had any fainting events..  He returns to the office today for an evaluation.  He is sleeping fairly well at night.  Past Medical History:  Diagnosis Date  . Cellulitis   . Chronic diastolic CHF (congestive heart failure) (HCC)    a. EF initially 35-40% after MI 1/09; b. echo 7/08: EF 60%;  c. 11/2014 Echo: EF 65-70%, Gr 1 DD, mild MR.  . Coronary artery disease    a. s/p anterior STEMI 04/2007 with BMS-> LAD;  b. Cath 12/2011 patent stent;  c. low risk nuc 04/2014.  . Crohn's disease ([place])   . Diaphragmatic hernia without mention of obstruction or gangrene   . Diverticulosis of colon (without mention of hemorrhage)   . Fatty liver    a. on ultrasound of 10/2009  . Flatulence, eructation, and gas pain   . Fungal infection   . Gait disorder   . GERD (gastroesophageal reflux disease)   . HOH (hard of hearing)    Hearing aids  . Hyperlipidemia   . Hypertension   . Ischemic cardiomyopathy    a. EF initially 35-40% after MI 1/09; b. echo 7/08: EF 60%;  c. 11/2014 Echo: EF 65-70%, Gr 1 DD, mild MR.  . Melanoma ([place])   . Memory disorder   . Orthostatic hypotension   . Osteoporosis   . Other chronic  nonalcoholic liver disease   . Other esophagitis   . Parkinson's disease ([place])   . RBBB 09/02/2013  . Regional enteritis of small intestine ([place])   . Renal calculi   . Restless leg syndrome 03/22/2015  . 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 Urinary incontinence     Past Surgical History:  Procedure Laterality Date  . APPENDECTOMY    . BACK SURGERY     L3, L4, L5  . CARDIAC CATHETERIZATION  09/25/06   EF 35-40% but more recently 60%  . CATARACT EXTRACTION     Bilateral  . CHOLECYSTECTOMY    . Coronary artery stent placement    . Melanoma resection    . Partial bowel resection    . TONSILLECTOMY      Family History  Problem Relation Age of Onset  . Heart failure Mother   . Heart attack Mother   . Hypertension Mother   . Emphysema Father   . Heart disease Brother   . Heart disease Brother   . Coronary artery disease Unknown   . Colon cancer Brother        older at dx  . Stomach cancer Neg Hx     Social history:  reports that he has never smoked. He has never used smokeless tobacco. He reports current alcohol use of about 2.0 - 3.0 standard drinks of alcohol per week. He reports that he does not use drugs.   No Known Allergies  Medications:  Prior to Admission medications   Medication Sig Start Date End Date Taking? Authorizing Provider  acetaminophen (TYLENOL) 325 MG tablet Take 650 mg by mouth at bedtime. And q 6 hrs prn    [provider]  ADVAIR DISKUS 250-50 MCG/DOSE AEPB Inhale 1 puff into the lungs daily. Daily For shortness of breath 01/24/14   [provider]  aspirin EC 81 MG tablet Take 81 mg by mouth daily.    [provider]  atorvastatin (LIPITOR) 40 MG tablet Take 40 mg by mouth daily.    [provider]  calcium carbonate (CALCIUM 600) 600 MG TABS tablet Take 600 mg by mouth daily with breakfast.    [provider]  carbidopa-levodopa (SINEMET CR) 50-200 MG tablet Take 1 tablet by mouth 4 (four) times  daily. 10/03/17   [name], MD  Carbidopa-Levodopa ER (SINEMET CR) 25-100 MG tablet controlled release Take 0.5 tablets by mouth 4 (four) times daily.    [provider]  carvedilol (COREG) 3.125 MG tablet take one tablet ONLY if systolic BP greater than 580    [provider]  Cholecalciferol (VITAMIN D-3) 1000 UNITS CAPS Take 2,000 Units by mouth daily.     [provider]  cholestyramine (QUESTRAN) 4 g packet TAKE 1 PACKET BY MOUTH TWICE DAILY WITH A MEAL 01/16/17   [name] Banister, MD  donepezil (ARICEPT) 10 MG tablet TAKE ONE TABLET AT BEDTIME 12/10/17   [name], MD  ipratropium (ATROVENT) 0.03 % nasal spray Place 2 sprays into both nostrils 3 (three) times daily.    [provider]  loperamide (IMODIUM A-D) 2 MG tablet Take 2 mg by mouth 2 (two) times daily as needed for diarrhea or loose stools.    [provider]  Melatonin 5 MG TABS Take 1 tablet by mouth at bedtime.     [provider]  midodrine (PROAMATINE) 5 MG tablet Take 1 tablet (5 mg total) by mouth 2 (two) times daily with a meal. 11/23/14   Dunn, Dayna N, PA-C  Multiple Vitamins-Minerals (CENTRUM SILVER PO) Take 1 tablet by mouth daily.    [provider]  nitroGLYCERIN (NITROSTAT) 0.4 MG SL tablet Place 1 tablet (0.4 mg total) under the tongue every 5 (five) minutes as needed for chest pain. PT OVERDUE FOR OV PLEASE CALL FOR APPT 07/07/17   [name], [name], MD  pantoprazole (PROTONIX) 40 MG tablet Take 40 mg by mouth daily.  12/30/14   [provider]  potassium chloride SA (K-DUR,KLOR-CON) 20 MEQ tablet Take 40 mEq by mouth daily.    [provider]  Probiotic Product (ALIGN) 4 MG CAPS Take 1 capsule by mouth daily.     [provider]  sodium chloride (OCEAN) 0.65 % SOLN nasal spray Place 1 spray into both nostrils daily as needed for congestion.    [provider]  torsemide (DEMADEX) 20 MG tablet Take 1 tablet (20 mg  total) by mouth daily. 11/23/14   Dunn, [name] Hai, PA-C  vitamin B-12 (CYANOCOBALAMIN) 1000 MCG tablet Take 1,000 mcg by mouth daily.    [provider]    ROS:  Out of a complete 14 system review of symptoms, the patient complains only  of the following symptoms, and all other reviewed systems are negative.  Walking difficulty Memory disturbance  Blood pressure 110/78, pulse 98, temperature 97.8 F (36.6 C), temperature source Temporal.  Physical Exam  General: The patient is alert and cooperative at the time of the examination.  Skin: No significant peripheral edema is noted.   Neurologic Exam  Mental status: The patient is alert and oriented x 3 at the time of the examination. The Mini-Mental status examination done today shows a total score of 27/30.   Cranial nerves: Facial symmetry is present. Speech is normal, no aphasia or dysarthria is noted. Extraocular movements are full. Visual fields are full.  Motor: The patient has good strength in all 4 extremities.  Sensory examination: Soft touch sensation is symmetric on the face, arms, and legs.  Coordination: The patient has good finger-nose-finger and heel-to-shin bilaterally.  Gait and station: The patient requires some assistance with standing, he comes in a wheelchair today.  Once up, he can take a few steps with the examiner, he has some difficulty with turns.  Romberg is negative.  Tandem gait was not attempted.  Reflexes: Deep tendon reflexes are symmetric.   Assessment/Plan:  1.  Parkinson's disease  2.  Memory disturbance  3.  Gait disturbance  The patient will need to continue to walk on a regular basis.  He has had a decline in his physical abilities since the [place] pandemic.  He is to try to remain as active as possible.  We will see him back in about 3 to 4 months, we may consider going up on the Sinemet IR dosing at that time.  Jill Alexanders MD 12/16/2018 3:07 PM  [facility] [address] [place], [place] [address]  Phone [phone] Fax [phone]

## 2018-12-17 DIAGNOSIS — R296 Repeated falls: Secondary | ICD-10-CM | POA: Diagnosis not present

## 2018-12-17 DIAGNOSIS — R41840 Attention and concentration deficit: Secondary | ICD-10-CM | POA: Diagnosis not present

## 2018-12-17 DIAGNOSIS — I5032 Chronic diastolic (congestive) heart failure: Secondary | ICD-10-CM | POA: Diagnosis not present

## 2018-12-17 DIAGNOSIS — I951 Orthostatic hypotension: Secondary | ICD-10-CM | POA: Diagnosis not present

## 2018-12-17 DIAGNOSIS — W1839XD Other fall on same level, subsequent encounter: Secondary | ICD-10-CM | POA: Diagnosis not present

## 2018-12-17 DIAGNOSIS — G20 Parkinson's disease: Secondary | ICD-10-CM | POA: Diagnosis not present

## 2018-12-17 DIAGNOSIS — I950 Idiopathic hypotension: Secondary | ICD-10-CM | POA: Diagnosis not present

## 2018-12-24 ENCOUNTER — Non-Acute Institutional Stay (SKILLED_NURSING_FACILITY): Payer: PPO | Admitting: Adult Health

## 2018-12-24 ENCOUNTER — Encounter: Payer: Self-pay | Admitting: Adult Health

## 2018-12-24 DIAGNOSIS — I5032 Chronic diastolic (congestive) heart failure: Secondary | ICD-10-CM | POA: Diagnosis not present

## 2018-12-24 DIAGNOSIS — N3945 Continuous leakage: Secondary | ICD-10-CM | POA: Diagnosis not present

## 2018-12-24 DIAGNOSIS — I950 Idiopathic hypotension: Secondary | ICD-10-CM | POA: Diagnosis not present

## 2018-12-24 DIAGNOSIS — I10 Essential (primary) hypertension: Secondary | ICD-10-CM | POA: Diagnosis not present

## 2018-12-24 DIAGNOSIS — K50918 Crohn's disease, unspecified, with other complication: Secondary | ICD-10-CM

## 2018-12-24 DIAGNOSIS — W1839XD Other fall on same level, subsequent encounter: Secondary | ICD-10-CM | POA: Diagnosis not present

## 2018-12-24 DIAGNOSIS — W19XXXD Unspecified fall, subsequent encounter: Secondary | ICD-10-CM | POA: Diagnosis not present

## 2018-12-24 DIAGNOSIS — R609 Edema, unspecified: Secondary | ICD-10-CM

## 2018-12-24 DIAGNOSIS — R296 Repeated falls: Secondary | ICD-10-CM | POA: Diagnosis not present

## 2018-12-24 DIAGNOSIS — I951 Orthostatic hypotension: Secondary | ICD-10-CM | POA: Diagnosis not present

## 2018-12-24 DIAGNOSIS — G20 Parkinson's disease: Secondary | ICD-10-CM | POA: Diagnosis not present

## 2018-12-24 DIAGNOSIS — R41840 Attention and concentration deficit: Secondary | ICD-10-CM | POA: Diagnosis not present

## 2018-12-25 DIAGNOSIS — R41840 Attention and concentration deficit: Secondary | ICD-10-CM | POA: Diagnosis not present

## 2018-12-25 DIAGNOSIS — R296 Repeated falls: Secondary | ICD-10-CM | POA: Diagnosis not present

## 2018-12-25 DIAGNOSIS — I951 Orthostatic hypotension: Secondary | ICD-10-CM | POA: Diagnosis not present

## 2018-12-25 DIAGNOSIS — I5032 Chronic diastolic (congestive) heart failure: Secondary | ICD-10-CM | POA: Diagnosis not present

## 2018-12-25 DIAGNOSIS — G20 Parkinson's disease: Secondary | ICD-10-CM | POA: Diagnosis not present

## 2018-12-25 DIAGNOSIS — W1839XD Other fall on same level, subsequent encounter: Secondary | ICD-10-CM | POA: Diagnosis not present

## 2018-12-25 DIAGNOSIS — I950 Idiopathic hypotension: Secondary | ICD-10-CM | POA: Diagnosis not present

## 2018-12-25 NOTE — Progress Notes (Signed)
Provider:   Cindi Carbon, ANP [place] [phone]  Location: [place] of Service:  SNF (31)   PCP: Gayland Curry, DO Patient Care Team: Gayland Curry, DO as PCP - General (Geriatric Medicine) [name], [name], MD as Consulting Physician (Cardiology) [name], MD as Attending Physician (Gastroenterology) [name], MD as Consulting Physician (Neurology) Community, [facility] ([place]) Royal Hawthorn, NP as Nurse Practitioner (Nurse Practitioner)  Extended Emergency Contact Information Primary Emergency Contact: [name] Address: [address]          [name] Phone: [phone] Mobile Phone: [phone] Relation: Spouse  Code Status: DNR Goals of Care: Advanced Directive information Advanced Directives 10/22/2018  Does Patient Have a Medical Advance Directive? Yes  Type of Paramedic of [place];Living will  Does patient want to make changes to medical advance directive? No - Patient declined  Copy of [place] in Chart? Yes - validated most recent copy scanned in chart (See row information)  Pre-existing out of facility DNR order (yellow form or pink MOST form) -     Chief Complaint  Patient presents with  . Medical Management of Chronic Issues    HPI: Patient is a 85 y.o. male seen today medical management of chronic diseases.   The nurse reports he had a bout of diarrhea and got up without help to go to the bathroom and fell on his elbows. Small skin tears were noted to his elbows but no other injuries. The loose stools have resolved with no abd pain or fever. He reports feeling bloated and just ate 4 doughnuts.   His bp has been WNL and prn coreg has not been needed, Midwife requesting to d/c.   Due to weakness he is not ambulatory and mainly stands and pivots for transfers. He  needs to have staff with him to prevent a fall. He tends to forget to call for help and tries to be independent.   Past Medical History:  Diagnosis Date  . Cellulitis   . Chronic diastolic CHF (congestive heart failure) (HCC)    a. EF initially 35-40% after MI 1/09; b. echo 7/08: EF 60%;  c. 11/2014 Echo: EF 65-70%, Gr 1 DD, mild MR.  . Coronary artery disease    a. s/p anterior STEMI 04/2007 with BMS-> LAD;  b. Cath 12/2011 patent stent;  c. low risk nuc 04/2014.  . Crohn's disease ([place])   . Diaphragmatic hernia without mention of obstruction or gangrene   . Diverticulosis of colon (without mention of hemorrhage)   . Fatty liver    a. on ultrasound of 10/2009  . Flatulence, eructation, and gas pain   . Fungal infection   . Gait disorder   . GERD (gastroesophageal reflux disease)   . HOH (hard of hearing)    Hearing aids  . Hyperlipidemia   . Hypertension   . Ischemic cardiomyopathy    a. EF initially 35-40% after MI 1/09; b. echo 7/08: EF 60%;  c. 11/2014 Echo: EF 65-70%, Gr 1 DD, mild MR.  . Melanoma ([place])   . Memory disorder   . Orthostatic hypotension   . Osteoporosis   . Other chronic nonalcoholic liver disease   . Other esophagitis   . Parkinson's disease ([place])   . RBBB 09/02/2013  . Regional enteritis of small intestine ([place])   . Renal calculi   .  Restless leg syndrome 03/22/2015  . 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 Urinary incontinence    Past Surgical History:  Procedure Laterality Date  . APPENDECTOMY    . BACK SURGERY     L3, L4, L5  . CARDIAC CATHETERIZATION  09/25/06   EF 35-40% but more recently 60%  . CATARACT EXTRACTION     Bilateral  . CHOLECYSTECTOMY    . Coronary artery stent placement    . Melanoma resection    . Partial bowel resection    . TONSILLECTOMY      reports that he has never smoked. He has never used smokeless tobacco. He reports current alcohol use of about 2.0 - 3.0 standard drinks of alcohol per week. He reports that he does not  use drugs. Social History   Socioeconomic History  . Marital status: Married    Spouse name: [name]  . Number of children: 3  . Years of education: 16  . Highest education level: Bachelor's degree (e.g., BA, AB, BS)  Occupational History  . Occupation: CEO-retired    Employer: [employer]  . Occupation: Licensed conveyancer: [employer]  . Financial resource strain: Not hard at all  . Food insecurity    Worry: Never true    Inability: Never true  . Transportation needs    Medical: No    Non-medical: No  Tobacco Use  . Smoking status: Never Smoker  . Smokeless tobacco: Never Used  Substance and Sexual Activity  . Alcohol use: Yes    Alcohol/week: 2.0 - 3.0 standard drinks    Types: 2 - 3 Standard drinks or equivalent per week    Comment: occassionally  . Drug use: No  . Sexual activity: Not Currently  Lifestyle  . Physical activity    Days per week: 0 days    Minutes per session: 0 min  . Stress: Not at all  Relationships  . Social connections    Talks on phone: More than three times a week    Gets together: More than three times a week    Attends religious service: Never    Active member of club or organization: No    Attends meetings of clubs or organizations: Never    Relationship status: Married  Other Topics Concern  . Not on file  Social History Narrative   Lives w/ his wife at [place]   Patient is right handed.   Patient drinks very little caffeine.   Family History  Problem Relation Age of Onset  . Heart failure Mother   . Heart attack Mother   . Hypertension Mother   . Emphysema Father   . Heart disease Brother   . Heart disease Brother   . Coronary artery disease Unknown   . Colon cancer Brother        older at dx  . Stomach cancer Neg Hx     Pertinent  Health Maintenance Due  Topic Date Due  . INFLUENZA VACCINE  11/07/2018  . PNA vac Low Risk Adult  Completed   Fall Risk  10/22/2018 05/12/2018  10/03/2017 12/29/2014  Falls in the past year? 1 1 No Yes  Number falls in past yr: 1 1 - 1  Injury with Fall? 1 0 - No  Risk for fall due to : History of fall(s);Impaired mobility Impaired mobility;History of fall(s);Impaired balance/gait;Mental status change;Medication side effect - Impaired balance/gait  Follow up Falls evaluation completed;Falls prevention discussed Falls evaluation  completed;Falls prevention discussed;Education provided - Falls evaluation completed   Depression screen [facility] 2/9 10/22/2018 05/20/2018 12/18/2017 12/29/2014  Decreased Interest 0 0 0 0  Down, Depressed, Hopeless 0 0 0 0  PHQ - 2 Score 0 0 0 0    Functional Status Survey:    No Known Allergies  Outpatient Encounter Medications as of 12/24/2018  Medication Sig  . acetaminophen (TYLENOL) 325 MG tablet Take 650 mg by mouth at bedtime. And q 6 hrs prn  . aspirin EC 81 MG tablet Take 81 mg by mouth daily.  Marland Kitchen atorvastatin (LIPITOR) 40 MG tablet Take 40 mg by mouth daily.  . calcium carbonate (CALCIUM 600) 600 MG TABS tablet Take 600 mg by mouth daily with breakfast.  . carbidopa-levodopa (SINEMET CR) 50-200 MG tablet Take 1 tablet by mouth 4 (four) times daily.  . Carbidopa-Levodopa ER (SINEMET CR) 25-100 MG tablet controlled release Take 0.5 tablets by mouth 4 (four) times daily.  . Cholecalciferol (VITAMIN D-3) 1000 UNITS CAPS Take 2,000 Units by mouth daily.   . cholestyramine (QUESTRAN) 4 g packet TAKE 1 PACKET BY MOUTH TWICE DAILY WITH A MEAL  . donepezil (ARICEPT) 10 MG tablet TAKE ONE TABLET AT BEDTIME  . ipratropium (ATROVENT) 0.03 % nasal spray Place 2 sprays into both nostrils 3 (three) times daily.  . Melatonin 5 MG TABS Take 1 tablet by mouth at bedtime.   . midodrine (PROAMATINE) 5 MG tablet Take 1 tablet (5 mg total) by mouth 2 (two) times daily with a meal.  . Multiple Vitamins-Minerals (CENTRUM SILVER PO) Take 1 tablet by mouth daily.  . nitroGLYCERIN (NITROSTAT) 0.4 MG SL tablet Place 1 tablet (0.4  mg total) under the tongue every 5 (five) minutes as needed for chest pain. PT OVERDUE FOR OV PLEASE CALL FOR APPT  . pantoprazole (PROTONIX) 40 MG tablet Take 40 mg by mouth daily.   . potassium chloride SA (K-DUR,KLOR-CON) 20 MEQ tablet Take 40 mEq by mouth daily.  . Probiotic Product (ALIGN) 4 MG CAPS Take 1 capsule by mouth daily.   . sodium chloride (OCEAN) 0.65 % SOLN nasal spray Place 1 spray into both nostrils daily as needed for congestion.  . torsemide (DEMADEX) 20 MG tablet Take 1 tablet (20 mg total) by mouth daily.  . vitamin B-12 (CYANOCOBALAMIN) 1000 MCG tablet Take 1,000 mcg by mouth daily.  . [DISCONTINUED] ADVAIR DISKUS 250-50 MCG/DOSE AEPB Inhale 1 puff into the lungs daily. Daily For shortness of breath  . [DISCONTINUED] carvedilol (COREG) 3.125 MG tablet take one tablet ONLY if systolic BP greater than 532  . [DISCONTINUED] loperamide (IMODIUM A-D) 2 MG tablet Take 2 mg by mouth 2 (two) times daily as needed for diarrhea or loose stools.   No facility-administered encounter medications on file as of 12/24/2018.     Review of Systems  Constitutional: Negative for activity change, appetite change, chills, diaphoresis, fatigue, fever and unexpected weight change.  Eyes: Negative for visual disturbance.  Respiratory: Negative for cough, shortness of breath, wheezing and stridor.   Cardiovascular: Negative for chest pain, palpitations and leg swelling.  Gastrointestinal: Negative for abdominal distention, abdominal pain, constipation and diarrhea.  Endocrine: Negative for polyuria.  Genitourinary: Negative for difficulty urinating and dysuria.  Musculoskeletal: Positive for gait problem. Negative for arthralgias, back pain, joint swelling and myalgias.  Skin: Positive for wound.  Neurological: Positive for weakness. Negative for dizziness, seizures, syncope, facial asymmetry, speech difficulty and headaches.  Hematological: Negative for adenopathy. Does not bruise/bleed  easily.  Psychiatric/Behavioral: Positive for confusion. Negative for agitation and behavioral problems.    Vitals:   12/25/18 1438  Weight: 174 lb 12.8 oz (79.3 kg)   Body mass index is 26.58 kg/m. Physical Exam Constitutional:      General: He is not in acute distress.    Appearance: He is not diaphoretic.  HENT:     Head: Normocephalic and atraumatic.  Neck:     Thyroid: No thyromegaly.     Vascular: No JVD.     Trachea: No tracheal deviation.  Cardiovascular:     Rate and Rhythm: Normal rate and regular rhythm.     Heart sounds: No murmur.  Pulmonary:     Effort: Pulmonary effort is normal. No respiratory distress.     Breath sounds: Normal breath sounds. No wheezing.  Abdominal:     General: Bowel sounds are normal. There is no distension.     Palpations: Abdomen is soft.     Tenderness: There is no abdominal tenderness.  Lymphadenopathy:     Cervical: No cervical adenopathy.  Skin:    General: Skin is warm and dry.     Comments: Mild ecchymoses to both elbows. Skin tears to both elbows are closed with no drainage.   Neurological:     Mental Status: He is alert and oriented to person, place, and time.     Cranial Nerves: No cranial nerve deficit.     Labs reviewed: Basic Metabolic Panel: Recent Labs    02/09/18 02/09/18 1251 08/24/18  NA 139 139 139  K 3.6 3.6 4.1  CL  --  97  --   CO2  --  31  --   GLUCOSE  --  127*  --   BUN 26* 26* 17  CREATININE 1.3 1.26 1.1  CALCIUM  --  8.6  --    Liver Function Tests: Recent Labs    08/24/18  AST 48*  ALT 23  ALKPHOS 88   No results for input(s): LIPASE, AMYLASE in the last 8760 hours. No results for input(s): AMMONIA in the last 8760 hours. CBC: Recent Labs    02/09/18 02/09/18 1251  WBC 9.6 9.6  HGB  --  14.1  HCT  --  41.8  MCV  --  92.7  PLT  --  304.0   Cardiac Enzymes: No results for input(s): CKTOTAL, CKMB, CKMBINDEX, TROPONINI in the last 8760 hours. BNP: Invalid input(s): POCBNP No  results found for: HGBA1C Lab Results  Component Value Date   TSH 2.428 11/12/2014   Lab Results  Component Value Date   VITAMINB12 469 12/13/2014   Lab Results  Component Value Date   FOLATE > 20.0 ng/mL 06/12/2006   No results found for: IRON, TIBC, FERRITIN  Imaging and Procedures obtained recently: No results found.  Assessment/Plan 1. Crohn's disease with other complication, unspecified gastrointestinal tract location ([place]) Flare earlier this week resolved fairly quickly. May have been food related. Recommended bland diet. Continue Questran 1 packet bid  2. Peripheral edema Continue torsemide 20 mg qd  3. Continuous leakage of urine Needs assistance with transfers, toileting, and hygiene  4. Essential hypertension D/C as needed coreg, bp controlled Continue to monitor manual bp daily x 1 week   5. Chronic diastolic CHF (congestive heart failure) (HCC) Compensated, continue current regimen and weight monitoring  6. Falls Continue to transfers without assistance. Encouraged to call for help to prevent falls. Fall Prec     Family/ staff Communication: staff and resident  Labs/tests ordered:NA

## 2018-12-28 DIAGNOSIS — W1839XD Other fall on same level, subsequent encounter: Secondary | ICD-10-CM | POA: Diagnosis not present

## 2018-12-28 DIAGNOSIS — R296 Repeated falls: Secondary | ICD-10-CM | POA: Diagnosis not present

## 2018-12-28 DIAGNOSIS — I5032 Chronic diastolic (congestive) heart failure: Secondary | ICD-10-CM | POA: Diagnosis not present

## 2018-12-28 DIAGNOSIS — I950 Idiopathic hypotension: Secondary | ICD-10-CM | POA: Diagnosis not present

## 2018-12-28 DIAGNOSIS — I951 Orthostatic hypotension: Secondary | ICD-10-CM | POA: Diagnosis not present

## 2018-12-28 DIAGNOSIS — G20 Parkinson's disease: Secondary | ICD-10-CM | POA: Diagnosis not present

## 2018-12-28 DIAGNOSIS — R41840 Attention and concentration deficit: Secondary | ICD-10-CM | POA: Diagnosis not present

## 2018-12-31 DIAGNOSIS — I951 Orthostatic hypotension: Secondary | ICD-10-CM | POA: Diagnosis not present

## 2018-12-31 DIAGNOSIS — R41840 Attention and concentration deficit: Secondary | ICD-10-CM | POA: Diagnosis not present

## 2018-12-31 DIAGNOSIS — R296 Repeated falls: Secondary | ICD-10-CM | POA: Diagnosis not present

## 2018-12-31 DIAGNOSIS — W1839XD Other fall on same level, subsequent encounter: Secondary | ICD-10-CM | POA: Diagnosis not present

## 2018-12-31 DIAGNOSIS — G20 Parkinson's disease: Secondary | ICD-10-CM | POA: Diagnosis not present

## 2018-12-31 DIAGNOSIS — I950 Idiopathic hypotension: Secondary | ICD-10-CM | POA: Diagnosis not present

## 2018-12-31 DIAGNOSIS — I5032 Chronic diastolic (congestive) heart failure: Secondary | ICD-10-CM | POA: Diagnosis not present

## 2019-01-06 DIAGNOSIS — R41840 Attention and concentration deficit: Secondary | ICD-10-CM | POA: Diagnosis not present

## 2019-01-06 DIAGNOSIS — R296 Repeated falls: Secondary | ICD-10-CM | POA: Diagnosis not present

## 2019-01-06 DIAGNOSIS — Z9189 Other specified personal risk factors, not elsewhere classified: Secondary | ICD-10-CM | POA: Diagnosis not present

## 2019-01-06 DIAGNOSIS — I951 Orthostatic hypotension: Secondary | ICD-10-CM | POA: Diagnosis not present

## 2019-01-06 DIAGNOSIS — I5032 Chronic diastolic (congestive) heart failure: Secondary | ICD-10-CM | POA: Diagnosis not present

## 2019-01-06 DIAGNOSIS — W1839XD Other fall on same level, subsequent encounter: Secondary | ICD-10-CM | POA: Diagnosis not present

## 2019-01-06 DIAGNOSIS — G20 Parkinson's disease: Secondary | ICD-10-CM | POA: Diagnosis not present

## 2019-01-06 DIAGNOSIS — I950 Idiopathic hypotension: Secondary | ICD-10-CM | POA: Diagnosis not present

## 2019-01-06 LAB — NOVEL CORONAVIRUS, NAA: SARS-CoV-2, NAA: NOT DETECTED

## 2019-01-07 DIAGNOSIS — I950 Idiopathic hypotension: Secondary | ICD-10-CM | POA: Diagnosis not present

## 2019-01-07 DIAGNOSIS — I5032 Chronic diastolic (congestive) heart failure: Secondary | ICD-10-CM | POA: Diagnosis not present

## 2019-01-07 DIAGNOSIS — W1839XD Other fall on same level, subsequent encounter: Secondary | ICD-10-CM | POA: Diagnosis not present

## 2019-01-07 DIAGNOSIS — G20 Parkinson's disease: Secondary | ICD-10-CM | POA: Diagnosis not present

## 2019-01-07 DIAGNOSIS — R41840 Attention and concentration deficit: Secondary | ICD-10-CM | POA: Diagnosis not present

## 2019-01-07 DIAGNOSIS — R296 Repeated falls: Secondary | ICD-10-CM | POA: Diagnosis not present

## 2019-01-07 DIAGNOSIS — I951 Orthostatic hypotension: Secondary | ICD-10-CM | POA: Diagnosis not present

## 2019-01-12 DIAGNOSIS — R296 Repeated falls: Secondary | ICD-10-CM | POA: Diagnosis not present

## 2019-01-12 DIAGNOSIS — G20 Parkinson's disease: Secondary | ICD-10-CM | POA: Diagnosis not present

## 2019-01-12 DIAGNOSIS — R41840 Attention and concentration deficit: Secondary | ICD-10-CM | POA: Diagnosis not present

## 2019-01-12 DIAGNOSIS — I5032 Chronic diastolic (congestive) heart failure: Secondary | ICD-10-CM | POA: Diagnosis not present

## 2019-01-12 DIAGNOSIS — I950 Idiopathic hypotension: Secondary | ICD-10-CM | POA: Diagnosis not present

## 2019-01-12 DIAGNOSIS — W1839XD Other fall on same level, subsequent encounter: Secondary | ICD-10-CM | POA: Diagnosis not present

## 2019-01-12 DIAGNOSIS — I951 Orthostatic hypotension: Secondary | ICD-10-CM | POA: Diagnosis not present

## 2019-01-13 ENCOUNTER — Other Ambulatory Visit: Payer: Self-pay | Admitting: *Deleted

## 2019-01-13 DIAGNOSIS — I5032 Chronic diastolic (congestive) heart failure: Secondary | ICD-10-CM | POA: Diagnosis not present

## 2019-01-13 DIAGNOSIS — R41840 Attention and concentration deficit: Secondary | ICD-10-CM | POA: Diagnosis not present

## 2019-01-13 DIAGNOSIS — I950 Idiopathic hypotension: Secondary | ICD-10-CM | POA: Diagnosis not present

## 2019-01-13 DIAGNOSIS — G20 Parkinson's disease: Secondary | ICD-10-CM | POA: Diagnosis not present

## 2019-01-13 DIAGNOSIS — R296 Repeated falls: Secondary | ICD-10-CM | POA: Diagnosis not present

## 2019-01-13 DIAGNOSIS — I951 Orthostatic hypotension: Secondary | ICD-10-CM | POA: Diagnosis not present

## 2019-01-13 DIAGNOSIS — W1839XD Other fall on same level, subsequent encounter: Secondary | ICD-10-CM | POA: Diagnosis not present

## 2019-01-14 DIAGNOSIS — Z20828 Contact with and (suspected) exposure to other viral communicable diseases: Secondary | ICD-10-CM | POA: Diagnosis not present

## 2019-01-19 DIAGNOSIS — I951 Orthostatic hypotension: Secondary | ICD-10-CM | POA: Diagnosis not present

## 2019-01-19 DIAGNOSIS — G20 Parkinson's disease: Secondary | ICD-10-CM | POA: Diagnosis not present

## 2019-01-19 DIAGNOSIS — R41840 Attention and concentration deficit: Secondary | ICD-10-CM | POA: Diagnosis not present

## 2019-01-19 DIAGNOSIS — I5032 Chronic diastolic (congestive) heart failure: Secondary | ICD-10-CM | POA: Diagnosis not present

## 2019-01-19 DIAGNOSIS — I950 Idiopathic hypotension: Secondary | ICD-10-CM | POA: Diagnosis not present

## 2019-01-19 DIAGNOSIS — R296 Repeated falls: Secondary | ICD-10-CM | POA: Diagnosis not present

## 2019-01-19 DIAGNOSIS — W1839XD Other fall on same level, subsequent encounter: Secondary | ICD-10-CM | POA: Diagnosis not present

## 2019-01-20 DIAGNOSIS — Z9189 Other specified personal risk factors, not elsewhere classified: Secondary | ICD-10-CM | POA: Diagnosis not present

## 2019-01-21 DIAGNOSIS — I5032 Chronic diastolic (congestive) heart failure: Secondary | ICD-10-CM | POA: Diagnosis not present

## 2019-01-21 DIAGNOSIS — R296 Repeated falls: Secondary | ICD-10-CM | POA: Diagnosis not present

## 2019-01-21 DIAGNOSIS — R41840 Attention and concentration deficit: Secondary | ICD-10-CM | POA: Diagnosis not present

## 2019-01-21 DIAGNOSIS — W1839XD Other fall on same level, subsequent encounter: Secondary | ICD-10-CM | POA: Diagnosis not present

## 2019-01-21 DIAGNOSIS — G20 Parkinson's disease: Secondary | ICD-10-CM | POA: Diagnosis not present

## 2019-01-21 DIAGNOSIS — I950 Idiopathic hypotension: Secondary | ICD-10-CM | POA: Diagnosis not present

## 2019-01-21 DIAGNOSIS — I951 Orthostatic hypotension: Secondary | ICD-10-CM | POA: Diagnosis not present

## 2019-01-25 DIAGNOSIS — Z9189 Other specified personal risk factors, not elsewhere classified: Secondary | ICD-10-CM | POA: Diagnosis not present

## 2019-01-26 ENCOUNTER — Non-Acute Institutional Stay (SKILLED_NURSING_FACILITY): Payer: PPO | Admitting: Internal Medicine

## 2019-01-26 ENCOUNTER — Encounter: Payer: Self-pay | Admitting: Internal Medicine

## 2019-01-26 DIAGNOSIS — K50918 Crohn's disease, unspecified, with other complication: Secondary | ICD-10-CM | POA: Diagnosis not present

## 2019-01-26 DIAGNOSIS — I951 Orthostatic hypotension: Secondary | ICD-10-CM

## 2019-01-26 DIAGNOSIS — I5032 Chronic diastolic (congestive) heart failure: Secondary | ICD-10-CM

## 2019-01-26 DIAGNOSIS — N3945 Continuous leakage: Secondary | ICD-10-CM | POA: Diagnosis not present

## 2019-01-26 DIAGNOSIS — G20 Parkinson's disease: Secondary | ICD-10-CM

## 2019-01-26 DIAGNOSIS — K2210 Ulcer of esophagus without bleeding: Secondary | ICD-10-CM | POA: Diagnosis not present

## 2019-01-26 DIAGNOSIS — K7460 Unspecified cirrhosis of liver: Secondary | ICD-10-CM | POA: Diagnosis not present

## 2019-01-26 DIAGNOSIS — Z66 Do not resuscitate: Secondary | ICD-10-CM | POA: Diagnosis not present

## 2019-01-26 DIAGNOSIS — F0280 Dementia in other diseases classified elsewhere without behavioral disturbance: Secondary | ICD-10-CM

## 2019-01-26 DIAGNOSIS — G20A1 Parkinson's disease without dyskinesia, without mention of fluctuations: Secondary | ICD-10-CM

## 2019-01-26 DIAGNOSIS — I255 Ischemic cardiomyopathy: Secondary | ICD-10-CM

## 2019-01-26 NOTE — Progress Notes (Signed)
[name], male   DOB: 08-25-1928, 85 y.o.   MRN: [id number]  Provider:  Gayland Curry, DO Location: [facility] Room Number: 409-W Place of Service:  SNF (31)   PCP: Gayland Curry, DO Patient Care Team: Gayland Curry, DO as PCP - General (Geriatric Medicine) [name], [name], MD as Consulting Physician (Cardiology) [name], MD as Attending Physician (Gastroenterology) [name], MD as Consulting Physician (Neurology) Community, [facility] ([place]) Royal Hawthorn, NP as Nurse Practitioner (Nurse Practitioner)  Extended Emergency Contact Information Primary Emergency Contact: [name] Address: [address]          [name] Phone: [phone] Mobile Phone: [phone] Relation: Spouse  Code Status: DNR  Goals of Care: Advanced Directive information Advanced Directives 01/26/2019  Does Patient Have a Medical Advance Directive? Yes  Type of Paramedic of [place];Living will;Out of facility DNR (pink MOST or yellow form)  Does patient want to make changes to medical advance directive? No - Patient declined  Copy of [place] in Chart? Yes - validated most recent copy scanned in chart (See row information)  Pre-existing out of facility DNR order (yellow form or pink MOST form) Yellow form placed in chart (order not valid for inpatient use)     Chief Complaint  Patient presents with  . Annual Exam    Yearly exam   . Advanced Directive    Reactivate DNR     HPI: Patient is a 85 y.o. male seen today for an annual comprehensive examination.    [name] has had a good year in skilled care.  He reports feeling well without complaints.  He continues to get around with his power scooter and use his walker for short distances in his snf apt.  He denies any pain (prior neck pain).  No dizziness.   No indigestion.    Crohn's:  His bowels have not been an issue as of late.    He was taking his medications when I arrived.  He was taking 2-3 pills at a time with sips of OJ and eating a small cup of applesauce.  He had no problems with coughing.    He's had no difficulty with swelling or shortness of breath.  Nursing reported no concerns outside of his falls which were if he did not get assistance during transfers to the power chair at times or lock his brakes.  Weight is up 4 lbs.  Past Medical History:  Diagnosis Date  . Cellulitis   . Chronic diastolic CHF (congestive heart failure) (HCC)    a. EF initially 35-40% after MI 1/09; b. echo 7/08: EF 60%;  c. 11/2014 Echo: EF 65-70%, Gr 1 DD, mild MR.  . Coronary artery disease    a. s/p anterior STEMI 04/2007 with BMS-> LAD;  b. Cath 12/2011 patent stent;  c. low risk nuc 04/2014.  . Crohn's disease ([place])   . Diaphragmatic hernia without mention of obstruction or gangrene   . Diverticulosis of colon (without mention of hemorrhage)   . Fatty liver    a. on ultrasound of 10/2009  . Flatulence, eructation, and gas pain   . Fungal infection   . Gait disorder   . GERD (gastroesophageal reflux disease)   . HOH (hard of hearing)    Hearing aids  . Hyperlipidemia   . Hypertension   . Ischemic cardiomyopathy  a. EF initially 35-40% after MI 1/09; b. echo 7/08: EF 60%;  c. 11/2014 Echo: EF 65-70%, Gr 1 DD, mild MR.  . Melanoma ([place])   . Memory disorder   . Orthostatic hypotension   . Osteoporosis   . Other chronic nonalcoholic liver disease   . Other esophagitis   . Parkinson's disease ([place])   . RBBB 09/02/2013  . Regional enteritis of small intestine ([place])   . Renal calculi   . Restless leg syndrome 03/22/2015  . 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 Urinary incontinence    Past Surgical History:  Procedure Laterality Date  . APPENDECTOMY    . BACK SURGERY     L3, L4, L5  . CARDIAC CATHETERIZATION  09/25/06   EF 35-40%  but more recently 60%  . CATARACT EXTRACTION     Bilateral  . CHOLECYSTECTOMY    . Coronary artery stent placement    . Melanoma resection    . Partial bowel resection    . TONSILLECTOMY      reports that he has never smoked. He has never used smokeless tobacco. He reports current alcohol use of about 2.0 - 3.0 standard drinks of alcohol per week. He reports that he does not use drugs. Social History   Socioeconomic History  . Marital status: Married    Spouse name: [name] [name]  . Number of children: 3  . Years of education: 16  . Highest education level: Bachelor's degree (e.g., BA, AB, BS)  Occupational History  . Occupation: CEO-retired    Employer: [employer]  . Occupation: Licensed conveyancer: [employer]  . Financial resource strain: Not hard at all  . Food insecurity    Worry: Never true    Inability: Never true  . Transportation needs    Medical: No    Non-medical: No  Tobacco Use  . Smoking status: Never Smoker  . Smokeless tobacco: Never Used  Substance and Sexual Activity  . Alcohol use: Yes    Alcohol/week: 2.0 - 3.0 standard drinks    Types: 2 - 3 Standard drinks or equivalent per week    Comment: occassionally  . Drug use: No  . Sexual activity: Not Currently  Lifestyle  . Physical activity    Days per week: 0 days    Minutes per session: 0 min  . Stress: Not at all  Relationships  . Social connections    Talks on phone: More than three times a week    Gets together: More than three times a week    Attends religious service: Never    Active member of club or organization: No    Attends meetings of clubs or organizations: Never    Relationship status: Married  Other Topics Concern  . Not on file  Social History Narrative   Lives w/ his wife at [place]   Patient is right handed.   Patient drinks very little caffeine.   Family History  Problem Relation Age of Onset  . Heart failure Mother   . Heart  attack Mother   . Hypertension Mother   . Emphysema Father   . Heart disease Brother   . Heart disease Brother   . Coronary artery disease Other   . Colon cancer Brother        older at dx  . Stomach cancer Neg Hx     Pertinent  Health Maintenance Due  Topic Date Due  . INFLUENZA VACCINE  Completed  . PNA vac Low Risk Adult  Completed   Fall Risk  10/22/2018 05/12/2018 10/03/2017 12/29/2014  Falls in the past year? 1 1 No Yes  Number falls in past yr: 1 1 - 1  Injury with Fall? 1 0 - No  Risk for fall due to : History of fall(s);Impaired mobility Impaired mobility;History of fall(s);Impaired balance/gait;Mental status change;Medication side effect - Impaired balance/gait  Follow up Falls evaluation completed;Falls prevention discussed Falls evaluation completed;Falls prevention discussed;Education provided - Falls evaluation completed   Depression screen [facility] 2/9 10/22/2018 05/20/2018 12/18/2017 12/29/2014  Decreased Interest 0 0 0 0  Down, Depressed, Hopeless 0 0 0 0  PHQ - 2 Score 0 0 0 0    Functional Status Survey:    No Known Allergies  Outpatient Encounter Medications as of 01/26/2019  Medication Sig  . acetaminophen (TYLENOL) 325 MG tablet Take 650 mg by mouth at bedtime. And q 6 hrs prn  . aspirin EC 81 MG tablet Take 81 mg by mouth daily.  Marland Kitchen atorvastatin (LIPITOR) 40 MG tablet Take 40 mg by mouth daily.  . calcium carbonate (CALCIUM 600) 600 MG TABS tablet Take 600 mg by mouth daily with breakfast.  . carbidopa-levodopa (SINEMET CR) 50-200 MG tablet Take 1 tablet by mouth 4 (four) times daily.  . Carbidopa-Levodopa ER (SINEMET CR) 25-100 MG tablet controlled release Take 0.5 tablets by mouth 4 (four) times daily.  . Cholecalciferol (VITAMIN D3) 50 MCG (2000 UT) TABS Take 1 tablet by mouth daily.  . cholestyramine (QUESTRAN) 4 g packet TAKE 1 PACKET BY MOUTH TWICE DAILY WITH A MEAL  . donepezil (ARICEPT) 10 MG tablet TAKE ONE TABLET AT BEDTIME  . ipratropium (ATROVENT) 0.03  % nasal spray Place 2 sprays into both nostrils 3 (three) times daily.  . Melatonin 5 MG TABS Take 1 tablet by mouth at bedtime.   . midodrine (PROAMATINE) 5 MG tablet Take 1 tablet (5 mg total) by mouth 2 (two) times daily with a meal.  . Multiple Vitamins-Minerals (CENTRUM SILVER PO) Take 1 tablet by mouth daily.  . nitroGLYCERIN (NITROSTAT) 0.4 MG SL tablet Place 1 tablet (0.4 mg total) under the tongue every 5 (five) minutes as needed for chest pain. PT OVERDUE FOR OV PLEASE CALL FOR APPT  . pantoprazole (PROTONIX) 40 MG tablet Take 40 mg by mouth daily.   . potassium chloride SA (K-DUR,KLOR-CON) 20 MEQ tablet Take 40 mEq by mouth daily.  . Probiotic Product (ALIGN) 4 MG CAPS Take 1 capsule by mouth daily.   . sodium chloride (OCEAN) 0.65 % SOLN nasal spray Place 2 sprays into both nostrils daily as needed for congestion.   . torsemide (DEMADEX) 20 MG tablet Take 1 tablet (20 mg total) by mouth daily.  . vitamin B-12 (CYANOCOBALAMIN) 1000 MCG tablet Take 1,000 mcg by mouth daily.  . [DISCONTINUED] Cholecalciferol (VITAMIN D-3) 1000 UNITS CAPS Take 2,000 Units by mouth daily.    No facility-administered encounter medications on file as of 01/26/2019.     Review of Systems  Constitutional: Negative for activity change, appetite change, chills and fever.  HENT: Negative for congestion and trouble swallowing.   Eyes: Negative for visual disturbance.  Respiratory: Negative for cough, choking and shortness of breath.   Cardiovascular: Negative for chest pain, palpitations and leg swelling.  Gastrointestinal: Negative for abdominal pain, constipation, diarrhea, nausea and vomiting.  Genitourinary: Negative for dysuria, frequency and urgency.  Musculoskeletal: Positive for gait problem. Negative for arthralgias and back pain.  Skin: Negative for color change.  Allergic/Immunologic: Negative for environmental allergies.  Neurological: Negative for dizziness and weakness.  Hematological:  Bruises/bleeds easily.  Psychiatric/Behavioral: Negative for agitation, confusion and sleep disturbance. The patient is not nervous/anxious.     Vitals:   01/26/19 1055  BP: 117/72  Pulse: 98  Resp: 17  Temp: (!) 97.4 F (36.3 C)  SpO2: 92%  Weight: 179 lb (81.2 kg)  Height: 5' 9"  (1.753 m)   Body mass index is 26.43 kg/m. Physical Exam Vitals signs reviewed.  Constitutional:      General: He is not in acute distress.    Appearance: Normal appearance. He is not ill-appearing or toxic-appearing.  HENT:     Head: Normocephalic and atraumatic.     Right Ear: External ear normal.     Left Ear: External ear normal.     Nose: Nose normal.     Mouth/Throat:     Pharynx: Oropharynx is clear. No oropharyngeal exudate.  Eyes:     Extraocular Movements: Extraocular movements intact.     Conjunctiva/sclera: Conjunctivae normal.     Pupils: Pupils are equal, round, and reactive to light.  Neck:     Musculoskeletal: Neck supple. No neck rigidity or muscular tenderness.  Cardiovascular:     Rate and Rhythm: Normal rate and regular rhythm.     Pulses: Normal pulses.     Heart sounds: Normal heart sounds.  Pulmonary:     Effort: Pulmonary effort is normal.     Breath sounds: Normal breath sounds. No wheezing, rhonchi or rales.  Abdominal:     General: Bowel sounds are normal. There is no distension.     Palpations: Abdomen is soft.     Tenderness: There is no abdominal tenderness. There is no guarding or rebound.  Musculoskeletal: Normal range of motion.     Right lower leg: No edema.     Left lower leg: No edema.  Lymphadenopathy:     Cervical: No cervical adenopathy.  Skin:    General: Skin is warm and dry.     Capillary Refill: Capillary refill takes less than 2 seconds.  Neurological:     General: No focal deficit present.     Mental Status: He is alert and oriented to person, place, and time.     Cranial Nerves: No cranial nerve deficit.     Gait: Gait abnormal.      Comments: Seated in power scooter  Psychiatric:        Mood and Affect: Mood normal.        Behavior: Behavior normal.     Labs reviewed: Basic Metabolic Panel: Recent Labs    02/09/18 02/09/18 1251 08/24/18  NA 139 139 139  K 3.6 3.6 4.1  CL  --  97  --   CO2  --  31  --   GLUCOSE  --  127*  --   BUN 26* 26* 17  CREATININE 1.3 1.26 1.1  CALCIUM  --  8.6  --    Liver Function Tests: Recent Labs    08/24/18  AST 48*  ALT 23  ALKPHOS 88   No results for input(s): LIPASE, AMYLASE in the last 8760 hours. No results for input(s): AMMONIA in the last 8760 hours. CBC: Recent Labs    02/09/18 02/09/18 1251  WBC 9.6 9.6  HGB  --  14.1  HCT  --  41.8  MCV  --  92.7  PLT  --  304.0   Cardiac  Enzymes: No results for input(s): CKTOTAL, CKMB, CKMBINDEX, TROPONINI in the last 8760 hours. BNP: Invalid input(s): POCBNP No results found for: HGBA1C Lab Results  Component Value Date   TSH 2.428 11/12/2014   Lab Results  Component Value Date   VITAMINB12 469 12/13/2014   Lab Results  Component Value Date   FOLATE > 20.0 ng/mL 06/12/2006   Assessment/Plan 1. Chronic diastolic CHF (congestive heart failure) (HCC) -no difficulty with exacerbations over the past year -doing well with current regimen:  Torsemide 100m daily, potassium 438m daily -bmp normal in may  2. Orthostatic hypotension -no reported symptoms as of late -continues on midodrine therapy bid with meals, compression socks -avoid dehydration -change positions slowly -ask for assistance  3. Ischemic cardiomyopathy -no recent chest pains, continues baby asa, statin, and prn ntg  4. Non-alcoholic cirrhosis (HCC) -not on any meds specific to this (bp does not permit meds for portal htn)  5. Crohn's disease with other complication, unspecified gastrointestinal tract location [facility]-continues on cholestyramine bulking agent which should be separate from other meds to prevent binding and interference with  effectiveness--is ordered twice daily with meals  6. Erosive esophagitis -remains on PPI without complaints  7. Parkinson disease ([place]-continue his sinemet regimen which is well-tolerated  8. Dementia due to Parkinson's disease without behavioral disturbance ([place]-mild--27/30 in september  9. Continuous leakage of urine -cont adult undergarments and keep clean and dry  10. DNR (do not resuscitate) - DNR (Do Not Resuscitate) order confirmed and  reentered  Family/ staff Communication: discussed with snf nurse  Labs/tests ordered:  No new; does need cbc, bmp, liver panel at least once a year due to meds and conditions  Kayliah Tindol L. Kahlen Morais, D.O. [facility] [address] [address] Phone (Mon-Fri 8am-5pm):  [phone] Call:  [phone] follow prompts after 5pm & weekends Office Phone:  [phone] Fax:  [phone]

## 2019-02-02 DIAGNOSIS — Z20828 Contact with and (suspected) exposure to other viral communicable diseases: Secondary | ICD-10-CM | POA: Diagnosis not present

## 2019-02-08 DIAGNOSIS — Z9189 Other specified personal risk factors, not elsewhere classified: Secondary | ICD-10-CM | POA: Diagnosis not present

## 2019-02-17 DIAGNOSIS — Z20828 Contact with and (suspected) exposure to other viral communicable diseases: Secondary | ICD-10-CM | POA: Diagnosis not present

## 2019-02-17 DIAGNOSIS — Z9189 Other specified personal risk factors, not elsewhere classified: Secondary | ICD-10-CM | POA: Diagnosis not present

## 2019-02-22 ENCOUNTER — Encounter: Payer: Self-pay | Admitting: Adult Health

## 2019-02-22 ENCOUNTER — Non-Acute Institutional Stay (SKILLED_NURSING_FACILITY): Payer: PPO | Admitting: Adult Health

## 2019-02-22 DIAGNOSIS — F0280 Dementia in other diseases classified elsewhere without behavioral disturbance: Secondary | ICD-10-CM

## 2019-02-22 DIAGNOSIS — I2510 Atherosclerotic heart disease of native coronary artery without angina pectoris: Secondary | ICD-10-CM | POA: Diagnosis not present

## 2019-02-22 DIAGNOSIS — K5000 Crohn's disease of small intestine without complications: Secondary | ICD-10-CM | POA: Diagnosis not present

## 2019-02-22 DIAGNOSIS — K7581 Nonalcoholic steatohepatitis (NASH): Secondary | ICD-10-CM | POA: Diagnosis not present

## 2019-02-22 DIAGNOSIS — G20 Parkinson's disease: Secondary | ICD-10-CM | POA: Diagnosis not present

## 2019-02-22 DIAGNOSIS — R269 Unspecified abnormalities of gait and mobility: Secondary | ICD-10-CM | POA: Diagnosis not present

## 2019-02-22 NOTE — Progress Notes (Signed)
Location:  Occupational psychologist of Service:  SNF (31) Provider:   Cindi Carbon, ANP [place] [phone]  Gayland Curry, DO  Patient Care Team: Gayland Curry, DO as PCP - General (Geriatric Medicine) [name], [name], MD as Consulting Physician (Cardiology) [name], MD as Attending Physician (Gastroenterology) [name], MD as Consulting Physician (Neurology) Community, [facility] ([place]) Royal Hawthorn, NP as Nurse Practitioner (Nurse Practitioner)  Extended Emergency Contact Information Primary Emergency Contact: [name] Address: [place]          [place] Phone: [phone] Mobile Phone: [phone] Relation: Spouse  Code Status:  DNR Goals of care: Advanced Directive information Advanced Directives 01/26/2019  Does Patient Have a Medical Advance Directive? Yes  Type of Paramedic of [place];Living will;Out of facility DNR (pink MOST or yellow form)  Does patient want to make changes to medical advance directive? No - Patient declined  Copy of [place] in Chart? Yes - validated most recent copy scanned in chart (See row information)  Pre-existing out of facility DNR order (yellow form or pink MOST form) Yellow form placed in chart (order not valid for inpatient use)     Chief Complaint  Patient presents with  . Medical Management of Chronic Issues    HPI:  Pt is a 85 y.o. male seen today for medical management of chronic diseases.    He reports that he is having occasional loose stools but no abd pain or fever. Reports a robust appetite and has gained a few lbs. No issues with increased edema or SOB.   He continues to use his motorized chair for long distances and can pivot transfer to his recliner and the toilet if needed. He is high fall risk due to his hx of falls and parkisonism and  needs to have assistance/call for help.   BP has been controlled with no syncopal episodes or low bp. Denies any feelings of dizziness. Feels great per pt.  Past Medical History:  Diagnosis Date  . Cellulitis   . Chronic diastolic CHF (congestive heart failure) (HCC)    a. EF initially 35-40% after MI 1/09; b. echo 7/08: EF 60%;  c. 11/2014 Echo: EF 65-70%, Gr 1 DD, mild MR.  . Coronary artery disease    a. s/p anterior STEMI 04/2007 with BMS-> LAD;  b. Cath 12/2011 patent stent;  c. low risk nuc 04/2014.  . Crohn's disease ([place])   . Diaphragmatic hernia without mention of obstruction or gangrene   . Diverticulosis of colon (without mention of hemorrhage)   . Fatty liver    a. on ultrasound of 10/2009  . Flatulence, eructation, and gas pain   . Fungal infection   . Gait disorder   . GERD (gastroesophageal reflux disease)   . HOH (hard of hearing)    Hearing aids  . Hyperlipidemia   . Hypertension   . Ischemic cardiomyopathy    a. EF initially 35-40% after MI 1/09; b. echo 7/08: EF 60%;  c. 11/2014 Echo: EF 65-70%, Gr 1 DD, mild MR.  . Melanoma ([place])   . Memory disorder   . Orthostatic hypotension   . Osteoporosis   . Other chronic nonalcoholic liver disease   . Other esophagitis   . Parkinson's disease ([place])   . RBBB 09/02/2013  . Regional enteritis of small intestine ([place])   . Renal calculi   .  Restless leg syndrome 03/22/2015  . Ulcerative (chronic) ileocolitis ([place])   . Urinary incontinence    Past Surgical History:  Procedure Laterality Date  . APPENDECTOMY    . BACK SURGERY     L3, L4, L5  . CARDIAC CATHETERIZATION  09/25/06   EF 35-40% but more recently 60%  . CATARACT EXTRACTION     Bilateral  . CHOLECYSTECTOMY    . Coronary artery stent placement    . Melanoma resection    . Partial bowel resection    . TONSILLECTOMY      No Known Allergies  Outpatient Encounter Medications as of 02/22/2019  Medication Sig  . acetaminophen (TYLENOL) 325 MG tablet Take 650  mg by mouth at bedtime. And q 6 hrs prn  . aspirin EC 81 MG tablet Take 81 mg by mouth daily.  Marland Kitchen atorvastatin (LIPITOR) 40 MG tablet Take 40 mg by mouth daily.  . calcium carbonate (CALCIUM 600) 600 MG TABS tablet Take 600 mg by mouth daily with breakfast.  . carbidopa-levodopa (SINEMET CR) 50-200 MG tablet Take 1 tablet by mouth 4 (four) times daily.  . Carbidopa-Levodopa ER (SINEMET CR) 25-100 MG tablet controlled release Take 0.5 tablets by mouth 4 (four) times daily.  . Cholecalciferol (VITAMIN D3) 50 MCG (2000 UT) TABS Take 1 tablet by mouth daily.  . cholestyramine (QUESTRAN) 4 g packet TAKE 1 PACKET BY MOUTH TWICE DAILY WITH A MEAL  . donepezil (ARICEPT) 10 MG tablet TAKE ONE TABLET AT BEDTIME  . ipratropium (ATROVENT) 0.03 % nasal spray Place 2 sprays into both nostrils 3 (three) times daily.  . Melatonin 5 MG TABS Take 1 tablet by mouth at bedtime.   . midodrine (PROAMATINE) 5 MG tablet Take 1 tablet (5 mg total) by mouth 2 (two) times daily with a meal.  . Multiple Vitamins-Minerals (CENTRUM SILVER PO) Take 1 tablet by mouth daily.  . nitroGLYCERIN (NITROSTAT) 0.4 MG SL tablet Place 1 tablet (0.4 mg total) under the tongue every 5 (five) minutes as needed for chest pain. PT OVERDUE FOR OV PLEASE CALL FOR APPT  . pantoprazole (PROTONIX) 40 MG tablet Take 40 mg by mouth daily.   . potassium chloride SA (K-DUR,KLOR-CON) 20 MEQ tablet Take 40 mEq by mouth daily.  . Probiotic Product (ALIGN) 4 MG CAPS Take 1 capsule by mouth daily.   . sodium chloride (OCEAN) 0.65 % SOLN nasal spray Place 2 sprays into both nostrils daily as needed for congestion.   . torsemide (DEMADEX) 20 MG tablet Take 1 tablet (20 mg total) by mouth daily.  . vitamin B-12 (CYANOCOBALAMIN) 1000 MCG tablet Take 1,000 mcg by mouth daily.   No facility-administered encounter medications on file as of 02/22/2019.     Review of Systems  Constitutional: Negative for activity change, appetite change, chills, diaphoresis,  fatigue, fever and unexpected weight change.  Respiratory: Negative for cough, shortness of breath, wheezing and stridor.   Cardiovascular: Negative for chest pain, palpitations and leg swelling.  Gastrointestinal: Negative for abdominal distention, abdominal pain, constipation and diarrhea.  Genitourinary: Negative for difficulty urinating and dysuria.  Musculoskeletal: Positive for gait problem. Negative for arthralgias, back pain, joint swelling and myalgias.  Neurological: Negative for dizziness, seizures, syncope, facial asymmetry, speech difficulty, weakness and headaches.  Hematological: Negative for adenopathy. Does not bruise/bleed easily.  Psychiatric/Behavioral: Positive for confusion. Negative for agitation and behavioral problems.    Immunization History  Administered Date(s) Administered  . Influenza, High Dose Seasonal PF 01/21/2019  . Influenza-Unspecified 12/06/2014,  11/27/2017  . Pneumococcal Conjugate-13 05/21/2018  . Pneumococcal Polysaccharide-23 05/21/2005  . Tdap 06/03/2006, 10/22/2018  . Zoster Recombinat (Shingrix) 10/22/2018   Pertinent  Health Maintenance Due  Topic Date Due  . INFLUENZA VACCINE  Completed  . PNA vac Low Risk Adult  Completed   Fall Risk  10/22/2018 05/12/2018 10/03/2017 12/29/2014  Falls in the past year? 1 1 No Yes  Number falls in past yr: 1 1 - 1  Injury with Fall? 1 0 - No  Risk for fall due to : History of fall(s);Impaired mobility Impaired mobility;History of fall(s);Impaired balance/gait;Mental status change;Medication side effect - Impaired balance/gait  Follow up Falls evaluation completed;Falls prevention discussed Falls evaluation completed;Falls prevention discussed;Education provided - Falls evaluation completed   Functional Status Survey:    Vitals:   02/22/19 1335  Weight: 180 lb (81.6 kg)   Body mass index is 26.58 kg/m.  Wt Readings from Last 3 Encounters:  02/22/19 180 lb (81.6 kg)  01/26/19 179 lb (81.2 kg)   12/25/18 174 lb 12.8 oz (79.3 kg)    Physical Exam Vitals signs and nursing note reviewed.  Constitutional:      General: He is not in acute distress.    Appearance: He is not diaphoretic.  HENT:     Head: Normocephalic and atraumatic.  Neck:     Musculoskeletal: Normal range of motion and neck supple.     Thyroid: No thyromegaly.     Vascular: No JVD.     Trachea: No tracheal deviation.  Cardiovascular:     Rate and Rhythm: Normal rate and regular rhythm.     Heart sounds: No murmur.  Pulmonary:     Effort: Pulmonary effort is normal. No respiratory distress.     Breath sounds: Normal breath sounds. No wheezing.  Abdominal:     General: Bowel sounds are normal. There is no distension.     Palpations: Abdomen is soft.     Tenderness: There is no abdominal tenderness.     Comments: rotund  Musculoskeletal:     Right lower leg: No edema.     Left lower leg: No edema.  Lymphadenopathy:     Cervical: No cervical adenopathy.  Skin:    General: Skin is warm and dry.  Neurological:     Mental Status: He is alert and oriented to person, place, and time.     Cranial Nerves: No cranial nerve deficit.     Comments: No resting tremor noted. Mild increased rigidity noted and bradykinesia.     Labs reviewed: Recent Labs    08/24/18  NA 139  K 4.1  BUN 17  CREATININE 1.1   Recent Labs    08/24/18  AST 48*  ALT 23  ALKPHOS 88   No results for input(s): WBC, NEUTROABS, HGB, HCT, MCV, PLT in the last 8760 hours. Lab Results  Component Value Date   TSH 2.428 11/12/2014   No results found for: HGBA1C Lab Results  Component Value Date   CHOL 129 08/24/2018   HDL 55 08/24/2018   LDLCALC 44 08/24/2018   TRIG 153 08/24/2018    Significant Diagnostic Results in last 30 days:  No results found.  Assessment/Plan 1. Dementia due to Parkinson's disease without behavioral disturbance (HCC) Continue Aricept 10 mg qd   2. Parkinson disease [facility])  Will follow up with Dr  Jannifer Franklin. Continue current dose of Sinemet.   3. Abnormality of gait Due to the above, needs assistance to prevent falls. Fall prec in  place.   4. NASH (nonalcoholic steatohepatitis) No issues. LFTs in May were WNL  5. Crohn's disease of small intestine without complication (HCC) Occasional loose stools but no acute exacerbations. Continue Questran 1 packet bid   6. Coronary artery disease involving native coronary artery of native heart without angina pectoris Noted hx of coronary stent  Continue Lipitor 40 mg qd and ASA 81 mg qd     Family/ staff Communication: discussed with his nurse Larene Beach  Labs/tests ordered:  NA

## 2019-02-23 DIAGNOSIS — Z9189 Other specified personal risk factors, not elsewhere classified: Secondary | ICD-10-CM | POA: Diagnosis not present

## 2019-03-02 DIAGNOSIS — Z9189 Other specified personal risk factors, not elsewhere classified: Secondary | ICD-10-CM | POA: Diagnosis not present

## 2019-03-15 DIAGNOSIS — Z9189 Other specified personal risk factors, not elsewhere classified: Secondary | ICD-10-CM | POA: Diagnosis not present

## 2019-03-15 DIAGNOSIS — Z20828 Contact with and (suspected) exposure to other viral communicable diseases: Secondary | ICD-10-CM | POA: Diagnosis not present

## 2019-03-19 DIAGNOSIS — Z20828 Contact with and (suspected) exposure to other viral communicable diseases: Secondary | ICD-10-CM | POA: Diagnosis not present

## 2019-03-19 DIAGNOSIS — Z9189 Other specified personal risk factors, not elsewhere classified: Secondary | ICD-10-CM | POA: Diagnosis not present

## 2019-03-22 ENCOUNTER — Encounter: Payer: Self-pay | Admitting: Adult Health

## 2019-03-22 ENCOUNTER — Non-Acute Institutional Stay (SKILLED_NURSING_FACILITY): Payer: PPO | Admitting: Adult Health

## 2019-03-22 DIAGNOSIS — K7581 Nonalcoholic steatohepatitis (NASH): Secondary | ICD-10-CM

## 2019-03-22 DIAGNOSIS — I2510 Atherosclerotic heart disease of native coronary artery without angina pectoris: Secondary | ICD-10-CM | POA: Diagnosis not present

## 2019-03-22 DIAGNOSIS — G20 Parkinson's disease: Secondary | ICD-10-CM

## 2019-03-22 DIAGNOSIS — L219 Seborrheic dermatitis, unspecified: Secondary | ICD-10-CM

## 2019-03-22 DIAGNOSIS — I5032 Chronic diastolic (congestive) heart failure: Secondary | ICD-10-CM

## 2019-03-22 DIAGNOSIS — J300 Vasomotor rhinitis: Secondary | ICD-10-CM | POA: Diagnosis not present

## 2019-03-22 DIAGNOSIS — F0280 Dementia in other diseases classified elsewhere without behavioral disturbance: Secondary | ICD-10-CM | POA: Diagnosis not present

## 2019-03-22 DIAGNOSIS — K5000 Crohn's disease of small intestine without complications: Secondary | ICD-10-CM

## 2019-03-22 DIAGNOSIS — Z20828 Contact with and (suspected) exposure to other viral communicable diseases: Secondary | ICD-10-CM | POA: Diagnosis not present

## 2019-03-22 DIAGNOSIS — G20A1 Parkinson's disease without dyskinesia, without mention of fluctuations: Secondary | ICD-10-CM

## 2019-03-22 DIAGNOSIS — Z9189 Other specified personal risk factors, not elsewhere classified: Secondary | ICD-10-CM | POA: Diagnosis not present

## 2019-03-22 NOTE — Progress Notes (Addendum)
Location:  Occupational psychologist of Service:  SNF (31) Provider:  Cindi Carbon, ANP [place] [phone]   Gayland Curry, DO  Patient Care Team: Gayland Curry, DO as PCP - General (Geriatric Medicine) [name], [name], MD as Consulting Physician (Cardiology) [name], MD as Attending Physician (Gastroenterology) [name], MD as Consulting Physician (Neurology) Community, [facility] ([place]) Royal Hawthorn, NP as Nurse Practitioner (Nurse Practitioner)  Extended Emergency Contact Information Primary Emergency Contact: [name] Address: [place]          [place] Phone: [phone] Mobile Phone: [phone] Relation: Spouse  Code Status:  DNR Goals of care: Advanced Directive information Advanced Directives 01/26/2019  Does Patient Have a Medical Advance Directive? Yes  Type of Paramedic of [place];Living will;Out of facility DNR (pink MOST or yellow form)  Does patient want to make changes to medical advance directive? No - Patient declined  Copy of [place] in Chart? Yes - validated most recent copy scanned in chart (See row information)  Pre-existing out of facility DNR order (yellow form or pink MOST form) Yellow form placed in chart (order not valid for inpatient use)     Chief Complaint  Patient presents with  . Medical Management of Chronic Issues    HPI:  Pt is a 85 y.o. male seen today for medical management of chronic diseases.     CAD: s/p anterior STEMI in 2009 with BMS to the LAD, currently on asa and lipitor. No reports of CP, DOE, or SOB  Diastolic CHF: EF 40-34%. No increase in weight or edema.   Crohn's: periodically has loose stools but no acute exacerbations. Currently on questran  Dementia: alert and oriented. Able to operate his motorized chair. MMSE score 29/30  10/23/18. Currently on aricept.   Hx of NASH: no new symptoms Lab Results  Component Value Date   ALT 23 08/24/2018   AST 48 (A) 08/24/2018   ALKPHOS 88 08/24/2018   BILITOT 1.8 (H) 12/18/2017   PD: falls with transfers occasionally. No issues worsening bradykinesia or tremor. Followed by Dr. Jannifer Franklin   Reports chronic runny nose with meals. Atrovent spray helps. No cough or fever. Screened for covid regularly.   Past Medical History:  Diagnosis Date  . Cellulitis   . Chronic diastolic CHF (congestive heart failure) (HCC)    a. EF initially 35-40% after MI 1/09; b. echo 7/08: EF 60%;  c. 11/2014 Echo: EF 65-70%, Gr 1 DD, mild MR.  . Coronary artery disease    a. s/p anterior STEMI 04/2007 with BMS-> LAD;  b. Cath 12/2011 patent stent;  c. low risk nuc 04/2014.  . Crohn's disease ([place])   . Diaphragmatic hernia without mention of obstruction or gangrene   . Diverticulosis of colon (without mention of hemorrhage)   . Fatty liver    a. on ultrasound of 10/2009  . Flatulence, eructation, and gas pain   . Fungal infection   . Gait disorder   . GERD (gastroesophageal reflux disease)   . HOH (hard of hearing)    Hearing aids  . Hyperlipidemia   . Hypertension   . Ischemic cardiomyopathy    a. EF initially 35-40% after MI 1/09; b. echo 7/08: EF 60%;  c. 11/2014 Echo: EF 65-70%, Gr 1 DD, mild MR.  . Melanoma ([place])   . Memory disorder   . Orthostatic  hypotension   . Osteoporosis   . Other chronic nonalcoholic liver disease   . Other esophagitis   . Parkinson's disease ([place])   . RBBB 09/02/2013  . Regional enteritis of small intestine ([place])   . Renal calculi   . Restless leg syndrome 03/22/2015  . 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 Urinary incontinence    Past Surgical History:  Procedure Laterality Date  . APPENDECTOMY    . BACK SURGERY     L3, L4, L5  . CARDIAC CATHETERIZATION  09/25/06   EF 35-40% but more recently 60%  . CATARACT EXTRACTION     Bilateral  .  CHOLECYSTECTOMY    . Coronary artery stent placement    . Melanoma resection    . Partial bowel resection    . TONSILLECTOMY      No Known Allergies  Outpatient Encounter Medications as of 03/22/2019  Medication Sig  . acetaminophen (TYLENOL) 325 MG tablet Take 650 mg by mouth at bedtime. And q 6 hrs prn  . aspirin EC 81 MG tablet Take 81 mg by mouth daily.  Marland Kitchen atorvastatin (LIPITOR) 40 MG tablet Take 40 mg by mouth daily.  . calcium carbonate (CALCIUM 600) 600 MG TABS tablet Take 600 mg by mouth daily with breakfast.  . carbidopa-levodopa (SINEMET CR) 50-200 MG tablet Take 1 tablet by mouth 4 (four) times daily.  . Carbidopa-Levodopa ER (SINEMET CR) 25-100 MG tablet controlled release Take 0.5 tablets by mouth 4 (four) times daily.  . Cholecalciferol (VITAMIN D3) 50 MCG (2000 UT) TABS Take 1 tablet by mouth daily.  . cholestyramine (QUESTRAN) 4 g packet TAKE 1 PACKET BY MOUTH TWICE DAILY WITH A MEAL  . donepezil (ARICEPT) 10 MG tablet TAKE ONE TABLET AT BEDTIME  . ipratropium (ATROVENT) 0.03 % nasal spray Place 2 sprays into both nostrils 3 (three) times daily.  . Melatonin 5 MG TABS Take 1 tablet by mouth at bedtime.   . midodrine (PROAMATINE) 5 MG tablet Take 1 tablet (5 mg total) by mouth 2 (two) times daily with a meal.  . Multiple Vitamins-Minerals (CENTRUM SILVER PO) Take 1 tablet by mouth daily.  . nitroGLYCERIN (NITROSTAT) 0.4 MG SL tablet Place 1 tablet (0.4 mg total) under the tongue every 5 (five) minutes as needed for chest pain. PT OVERDUE FOR OV PLEASE CALL FOR APPT  . pantoprazole (PROTONIX) 40 MG tablet Take 40 mg by mouth daily.   . potassium chloride SA (K-DUR,KLOR-CON) 20 MEQ tablet Take 40 mEq by mouth daily.  . Probiotic Product (ALIGN) 4 MG CAPS Take 1 capsule by mouth daily.   . sodium chloride (OCEAN) 0.65 % SOLN nasal spray Place 2 sprays into both nostrils daily as needed for congestion.   . torsemide (DEMADEX) 20 MG tablet Take 1 tablet (20 mg total) by mouth  daily.  . vitamin B-12 (CYANOCOBALAMIN) 1000 MCG tablet Take 1,000 mcg by mouth daily.   No facility-administered encounter medications on file as of 03/22/2019.    Review of Systems  Constitutional: Negative for activity change, appetite change, chills, diaphoresis, fatigue, fever and unexpected weight change.  HENT: Positive for rhinorrhea.   Respiratory: Negative for cough, shortness of breath, wheezing and stridor.   Cardiovascular: Negative for chest pain, palpitations and leg swelling.  Gastrointestinal: Negative for abdominal distention, abdominal pain, constipation and diarrhea.  Genitourinary: Negative for difficulty urinating and dysuria.  Musculoskeletal: Positive for gait problem. Negative for arthralgias, back pain, joint swelling and myalgias.  Neurological: Negative for dizziness,  seizures, syncope, facial asymmetry, speech difficulty, weakness and headaches.  Hematological: Negative for adenopathy. Does not bruise/bleed easily.  Psychiatric/Behavioral: Negative for agitation, behavioral problems and confusion.    Immunization History  Administered Date(s) Administered  . Influenza, High Dose Seasonal PF 01/21/2019  . Influenza-Unspecified 12/06/2014, 11/27/2017  . Pneumococcal Conjugate-13 05/21/2018  . Pneumococcal Polysaccharide-23 05/21/2005  . Tdap 06/03/2006, 10/22/2018  . Zoster Recombinat (Shingrix) 10/22/2018   Pertinent  Health Maintenance Due  Topic Date Due  . INFLUENZA VACCINE  Completed  . PNA vac Low Risk Adult  Completed   Fall Risk  10/22/2018 05/12/2018 10/03/2017 12/29/2014  Falls in the past year? 1 1 No Yes  Number falls in past yr: 1 1 - 1  Injury with Fall? 1 0 - No  Risk for fall due to : History of fall(s);Impaired mobility Impaired mobility;History of fall(s);Impaired balance/gait;Mental status change;Medication side effect - Impaired balance/gait  Follow up Falls evaluation completed;Falls prevention discussed Falls evaluation completed;Falls  prevention discussed;Education provided - Falls evaluation completed   Functional Status Survey:    Vitals:   03/22/19 1606  Weight: 177 lb 3.2 oz (80.4 kg)   Body mass index is 26.17 kg/m.  Wt Readings from Last 3 Encounters:  03/22/19 177 lb 3.2 oz (80.4 kg)  02/22/19 180 lb (81.6 kg)  01/26/19 179 lb (81.2 kg)    Physical Exam Constitutional:      General: He is not in acute distress.    Appearance: He is not diaphoretic.  HENT:     Head: Normocephalic and atraumatic.     Nose: Nose normal. No congestion.  Eyes:     Conjunctiva/sclera: Conjunctivae normal.     Pupils: Pupils are equal, round, and reactive to light.  Neck:     Thyroid: No thyromegaly.     Vascular: No JVD.     Trachea: No tracheal deviation.  Cardiovascular:     Rate and Rhythm: Normal rate and regular rhythm.     Heart sounds: No murmur.  Pulmonary:     Effort: Pulmonary effort is normal. No respiratory distress.     Breath sounds: Normal breath sounds. No wheezing.  Abdominal:     General: Bowel sounds are normal. There is no distension.     Palpations: Abdomen is soft.     Tenderness: There is no abdominal tenderness.     Comments: rotund  Lymphadenopathy:     Cervical: No cervical adenopathy.  Skin:    General: Skin is warm and dry.     Findings: Rash (maculopapular to anterior neck with erythema.) present.  Neurological:     Mental Status: He is alert and oriented to person, place, and time.     Cranial Nerves: No cranial nerve deficit.  Psychiatric:        Mood and Affect: Mood normal.     Labs reviewed: Recent Labs    08/24/18 0000  NA 139  K 4.1  BUN 17  CREATININE 1.1   Recent Labs    08/24/18 0000  AST 48*  ALT 23  ALKPHOS 88   No results for input(s): WBC, NEUTROABS, HGB, HCT, MCV, PLT in the last 8760 hours. Lab Results  Component Value Date   TSH 2.428 11/12/2014   No results found for: HGBA1C Lab Results  Component Value Date   CHOL 129 08/24/2018   HDL  55 08/24/2018   LDLCALC 44 08/24/2018   TRIG 153 08/24/2018    Significant Diagnostic Results in last 30 days:  No results found.  Assessment/Plan  1. Parkinson disease ([place]) Continue current regimen, followed by Dr. Jannifer Franklin.   2. Dementia due to Parkinson's disease without behavioral disturbance ([place]) Fairly stable cognition over the past year in the skilled care setting. Continue Aricept.   3. Crohn's disease of small intestine without complication ([place]) No new issues. Continue Questran 1 packet twice daily. Space out from other meds by 2-4 hrs.   4. NASH (nonalcoholic steatohepatitis) Monitor LFTs annually.   5. Chronic diastolic CHF (congestive heart failure) ([place]) Compensated. Continue torsemide 20 mg qd.   6. Coronary artery disease involving native coronary artery of native heart without angina pectoris Continue baby asa and lipitor. Monitor lipids annually.   7. Vasomotor rhinitis Continue atrovent tid with meals.   8. Seborrheic dermatitis Triamcinolone 0.1 % bid x 7 days to anterior neck   Family/ staff Communication: nurse/resident   Labs/tests ordered:  NA

## 2019-03-29 DIAGNOSIS — Z9189 Other specified personal risk factors, not elsewhere classified: Secondary | ICD-10-CM | POA: Diagnosis not present

## 2019-03-29 DIAGNOSIS — Z20828 Contact with and (suspected) exposure to other viral communicable diseases: Secondary | ICD-10-CM | POA: Diagnosis not present

## 2019-04-05 DIAGNOSIS — Z9189 Other specified personal risk factors, not elsewhere classified: Secondary | ICD-10-CM | POA: Diagnosis not present

## 2019-04-05 DIAGNOSIS — Z20828 Contact with and (suspected) exposure to other viral communicable diseases: Secondary | ICD-10-CM | POA: Diagnosis not present

## 2019-04-07 ENCOUNTER — Telehealth: Payer: Self-pay | Admitting: *Deleted

## 2019-04-07 NOTE — Telephone Encounter (Signed)
LVM advising appt canceled 04/13/19 d/t provider out of office. I called the pt's wife and spoke with her. She understands Dr. Jannifer Franklin out of office next week and appt will need to be canceled. I let her know we will call back asap to r/s. She said they are at [facility], should be getting COVID vaccine soon and would prefer to have appt after that. I advised the next available ov with Dr Jannifer Franklin other than a 7:30 AM (which pt cannot do) is May. The wife is willing to have the next f/u with Judson Roch NP if ok with Dr. Jannifer Franklin. Wife stated pt doesn't seem changed but she hardly sees him (30 minutes a day behind plexiglass). She will ask the nurse about his status as well. I told her I would call back.

## 2019-04-08 NOTE — Telephone Encounter (Signed)
Okay for Judson Roch to see.

## 2019-04-10 DIAGNOSIS — Z20828 Contact with and (suspected) exposure to other viral communicable diseases: Secondary | ICD-10-CM | POA: Diagnosis not present

## 2019-04-10 DIAGNOSIS — Z9189 Other specified personal risk factors, not elsewhere classified: Secondary | ICD-10-CM | POA: Diagnosis not present

## 2019-04-12 NOTE — Telephone Encounter (Signed)
Spoke with pt's wife [name] [name]. Pt is currently in quarantine and they are due for COVID vaccinations soon. The pt's wife requested an appt around end of February. I scheduled an appt with Sarah for Monday 05/24/19 @ 3:15 pm arrival 15-30 minutes early. Pt's wife prefers in office visit. She verbalized appreciation.

## 2019-04-13 ENCOUNTER — Ambulatory Visit: Payer: PPO | Admitting: Neurology

## 2019-04-19 DIAGNOSIS — Z20828 Contact with and (suspected) exposure to other viral communicable diseases: Secondary | ICD-10-CM | POA: Diagnosis not present

## 2019-04-19 DIAGNOSIS — Z9189 Other specified personal risk factors, not elsewhere classified: Secondary | ICD-10-CM | POA: Diagnosis not present

## 2019-04-26 DIAGNOSIS — Z20828 Contact with and (suspected) exposure to other viral communicable diseases: Secondary | ICD-10-CM | POA: Diagnosis not present

## 2019-04-26 DIAGNOSIS — Z9189 Other specified personal risk factors, not elsewhere classified: Secondary | ICD-10-CM | POA: Diagnosis not present

## 2019-04-27 ENCOUNTER — Non-Acute Institutional Stay (SKILLED_NURSING_FACILITY): Payer: PPO | Admitting: Internal Medicine

## 2019-04-27 ENCOUNTER — Encounter: Payer: Self-pay | Admitting: Internal Medicine

## 2019-04-27 DIAGNOSIS — K5000 Crohn's disease of small intestine without complications: Secondary | ICD-10-CM

## 2019-04-27 DIAGNOSIS — G20A1 Parkinson's disease without dyskinesia, without mention of fluctuations: Secondary | ICD-10-CM

## 2019-04-27 DIAGNOSIS — I951 Orthostatic hypotension: Secondary | ICD-10-CM

## 2019-04-27 DIAGNOSIS — K7581 Nonalcoholic steatohepatitis (NASH): Secondary | ICD-10-CM

## 2019-04-27 DIAGNOSIS — G20 Parkinson's disease: Secondary | ICD-10-CM

## 2019-04-27 DIAGNOSIS — I5032 Chronic diastolic (congestive) heart failure: Secondary | ICD-10-CM

## 2019-04-27 DIAGNOSIS — F0280 Dementia in other diseases classified elsewhere without behavioral disturbance: Secondary | ICD-10-CM

## 2019-04-27 DIAGNOSIS — E782 Mixed hyperlipidemia: Secondary | ICD-10-CM | POA: Diagnosis not present

## 2019-04-27 NOTE — Progress Notes (Signed)
Location:  [facility] Room Number: 500 Place of Service:  SNF [phone]) Provider:   Gayland Curry, DO  Patient Care Team: Gayland Curry, DO as PCP - General (Geriatric Medicine) [name], [name], MD as Consulting Physician (Cardiology) [name], MD as Attending Physician (Gastroenterology) [name], MD as Consulting Physician (Neurology) Community, [facility] ([place]) Royal Hawthorn, NP as Nurse Practitioner (Nurse Practitioner)  Extended Emergency Contact Information Primary Emergency Contact: [name] Address: [address]          [name] Phone: [phone] Mobile Phone: [phone] Relation: Spouse  Code Status:  DNR Goals of care: Advanced Directive information Advanced Directives 04/27/2019  Does Patient Have a Medical Advance Directive? Yes  Type of Advance Directive [place]  Does patient want to make changes to medical advance directive? No - Patient declined  Copy of [place] in Chart? -  Pre-existing out of facility DNR order (yellow form or pink MOST form) -     Chief Complaint  Patient presents with  . Medical Management of Chronic Issues     follow up     HPI:  Pt is a 86 y.o. male seen today for medical management of chronic diseases.  He has a h/o Crohn's with remote ileocecectomy, Parkinson's, orthostatic hypotension, hyperlipidemia, osteoporosis,  NASH, esophagitis, GERD, CAD, chronic diastolic chf and L49 deficiency.    When seen, he had no complaints whatsoever.  No pain.  Bowels stable.  No recent falls.  Does self transfer b/w manual and power wheelchair.  Weight is stable.  He misses more activity, but watches tv in his apt amid covid isolation in pandemic.  He's remained healthy.  Past Medical History:  Diagnosis Date  . Cellulitis   . Chronic diastolic CHF (congestive heart  failure) (HCC)    a. EF initially 35-40% after MI 1/09; b. echo 7/08: EF 60%;  c. 11/2014 Echo: EF 65-70%, Gr 1 DD, mild MR.  . Coronary artery disease    a. s/p anterior STEMI 04/2007 with BMS-> LAD;  b. Cath 12/2011 patent stent;  c. low risk nuc 04/2014.  . Crohn's disease ([place])   . Diaphragmatic hernia without mention of obstruction or gangrene   . Diverticulosis of colon (without mention of hemorrhage)   . Fatty liver    a. on ultrasound of 10/2009  . Flatulence, eructation, and gas pain   . Fungal infection   . Gait disorder   . GERD (gastroesophageal reflux disease)   . HOH (hard of hearing)    Hearing aids  . Hyperlipidemia   . Hypertension   . Ischemic cardiomyopathy    a. EF initially 35-40% after MI 1/09; b. echo 7/08: EF 60%;  c. 11/2014 Echo: EF 65-70%, Gr 1 DD, mild MR.  . Melanoma ([place])   . Memory disorder   . Orthostatic hypotension   . Osteoporosis   . Other chronic nonalcoholic liver disease   . Other esophagitis   . Parkinson's disease ([place])   . RBBB 09/02/2013  . Regional enteritis of small intestine ([place])   . Renal calculi   . Restless leg syndrome 03/22/2015  . 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 Urinary incontinence    Past Surgical History:  Procedure Laterality Date  . APPENDECTOMY    . BACK SURGERY     L3, L4, L5  . CARDIAC CATHETERIZATION  09/25/06  EF 35-40% but more recently 60%  . CATARACT EXTRACTION     Bilateral  . CHOLECYSTECTOMY    . Coronary artery stent placement    . Melanoma resection    . Partial bowel resection    . TONSILLECTOMY      No Known Allergies  Outpatient Encounter Medications as of 04/27/2019  Medication Sig  . acetaminophen (TYLENOL) 325 MG tablet Take 650 mg by mouth at bedtime. And q 6 hrs prn  . aspirin EC 81 MG tablet Take 81 mg by mouth daily.  Marland Kitchen atorvastatin (LIPITOR) 40 MG tablet Take 40 mg by mouth daily.  . calcium carbonate (CALCIUM 600) 600 MG TABS tablet Take 600 mg by mouth daily with breakfast.    . carbidopa-levodopa (SINEMET CR) 50-200 MG tablet Take 1 tablet by mouth 4 (four) times daily.  . Carbidopa-Levodopa ER (SINEMET CR) 25-100 MG tablet controlled release Take 0.5 tablets by mouth 4 (four) times daily.  . Cholecalciferol (VITAMIN D3) 50 MCG (2000 UT) TABS Take 1 tablet by mouth daily.  . cholestyramine (QUESTRAN) 4 g packet TAKE 1 PACKET BY MOUTH TWICE DAILY WITH A MEAL  . donepezil (ARICEPT) 10 MG tablet TAKE ONE TABLET AT BEDTIME  . ipratropium (ATROVENT) 0.03 % nasal spray Place 2 sprays into both nostrils 3 (three) times daily.  . Melatonin 5 MG TABS Take 1 tablet by mouth at bedtime.   . midodrine (PROAMATINE) 5 MG tablet Take 1 tablet (5 mg total) by mouth 2 (two) times daily with a meal.  . Multiple Vitamins-Minerals (CENTRUM SILVER PO) Take 1 tablet by mouth daily.  . nitroGLYCERIN (NITROSTAT) 0.4 MG SL tablet Place 1 tablet (0.4 mg total) under the tongue every 5 (five) minutes as needed for chest pain. PT OVERDUE FOR OV PLEASE CALL FOR APPT  . pantoprazole (PROTONIX) 40 MG tablet Take 40 mg by mouth daily.   . potassium chloride SA (K-DUR,KLOR-CON) 20 MEQ tablet Take 40 mEq by mouth daily.  . Probiotic Product (ALIGN) 4 MG CAPS Take 1 capsule by mouth daily.   . sodium chloride (OCEAN) 0.65 % SOLN nasal spray Place 2 sprays into both nostrils daily as needed for congestion.   . torsemide (DEMADEX) 20 MG tablet Take 1 tablet (20 mg total) by mouth daily.  . vitamin B-12 (CYANOCOBALAMIN) 1000 MCG tablet Take 1,000 mcg by mouth daily.  . [DISCONTINUED] SHINGRIX injection    No facility-administered encounter medications on file as of 04/27/2019.    Review of Systems  Constitutional: Negative for activity change, appetite change, chills and fever.  HENT: Negative for congestion, sore throat and voice change.   Eyes: Negative for visual disturbance.  Respiratory: Negative for chest tightness and shortness of breath.   Cardiovascular: Negative for chest pain,  palpitations and leg swelling.  Gastrointestinal: Negative for abdominal pain, constipation, diarrhea, nausea and vomiting.  Genitourinary: Negative for dysuria.  Musculoskeletal: Positive for gait problem. Negative for back pain.  Skin: Negative for color change.    Immunization History  Administered Date(s) Administered  . Influenza, High Dose Seasonal PF 01/21/2019  . Influenza-Unspecified 12/06/2014, 11/27/2017  . Moderna SARS-COVID-2 Vaccination 04/13/2019  . Pneumococcal Conjugate-13 05/21/2018  . Pneumococcal Polysaccharide-23 05/21/2005  . Tdap 06/03/2006, 10/22/2018  . Zoster Recombinat (Shingrix) 10/22/2018, 12/24/2018   Pertinent  Health Maintenance Due  Topic Date Due  . URINE MICROALBUMIN  01/07/1939  . INFLUENZA VACCINE  Completed  . PNA vac Low Risk Adult  Completed   Fall Risk  10/22/2018 05/12/2018  10/03/2017 12/29/2014  Falls in the past year? 1 1 No Yes  Number falls in past yr: 1 1 - 1  Injury with Fall? 1 0 - No  Risk for fall due to : History of fall(s);Impaired mobility Impaired mobility;History of fall(s);Impaired balance/gait;Mental status change;Medication side effect - Impaired balance/gait  Follow up Falls evaluation completed;Falls prevention discussed Falls evaluation completed;Falls prevention discussed;Education provided - Falls evaluation completed   Functional Status Survey:    Vitals:   04/27/19 1045  BP: 133/81  Pulse: 91  Temp: (!) 97.5 F (36.4 C)  SpO2: 93%  Weight: 176 lb 6.4 oz (80 kg)  Height: 5' 9"  (1.753 m)   Body mass index is 26.05 kg/m. Physical Exam Vitals reviewed.  Constitutional:      Appearance: Normal appearance.  HENT:     Head: Normocephalic and atraumatic.  Cardiovascular:     Rate and Rhythm: Normal rate and regular rhythm.     Pulses: Normal pulses.     Heart sounds: Normal heart sounds.  Pulmonary:     Effort: Pulmonary effort is normal.     Breath sounds: Normal breath sounds. No rales.  Abdominal:      General: Bowel sounds are normal.  Musculoskeletal:        General: Normal range of motion.     Right lower leg: No edema.     Left lower leg: No edema.     Comments: Seated in power chair  Skin:    General: Skin is warm and dry.  Neurological:     General: No focal deficit present.     Mental Status: He is alert and oriented to person, place, and time.     Cranial Nerves: No cranial nerve deficit.  Psychiatric:        Mood and Affect: Mood normal.     Labs reviewed: Recent Labs    08/24/18 0000  NA 139  K 4.1  BUN 17  CREATININE 1.1   Recent Labs    08/24/18 0000  AST 48*  ALT 23  ALKPHOS 88   No results for input(s): WBC, NEUTROABS, HGB, HCT, MCV, PLT in the last 8760 hours. Lab Results  Component Value Date   TSH 2.428 11/12/2014   No results found for: HGBA1C Lab Results  Component Value Date   CHOL 129 08/24/2018   HDL 55 08/24/2018   LDLCALC 44 08/24/2018   TRIG 153 08/24/2018   Assessment/Plan 1. Crohn's disease of small intestine without complication ([place]) -stable, cont align, questran  2. Parkinson disease ([place]) -cont sinemet--primarily LE symptoms -continues with his power chair, does self-transfer and had been falling during this process, but recently doing better when he takes his time  3. Dementia due to Parkinson's disease without behavioral disturbance ([place]) -continue aricept 77m daily; remains pleasant and sociable, no hallucinations noted  4. Mixed hyperlipidemia -stable on lipitor, tolerating fine  5. NASH (nonalcoholic steatohepatitis) -is on protonix, has not required beta blocker for portal htn/cirrhosis  6. Chronic diastolic CHF (congestive heart failure) (HCC) -stable w/o exacerbation, cont torsemide 239mpo daily  7. Orthostatic hypotension -no recent incidents of falling from positional changes too quickly -maintain adequate hydration w/o overdoing due to #6   Family/ staff Communication: discussed with snf  nurse  Labs/tests ordered:  No new  Sharvi Mooneyhan L. Matha Masse, D.O. [facility] [address] [address] Phone (Mon-Fri 8am-5pm):  [phone] Call:  [phone] follow prompts  after 5pm & weekends Office Phone:  [phone] Office Fax:  [phone]

## 2019-05-03 DIAGNOSIS — Z9189 Other specified personal risk factors, not elsewhere classified: Secondary | ICD-10-CM | POA: Diagnosis not present

## 2019-05-03 DIAGNOSIS — Z20828 Contact with and (suspected) exposure to other viral communicable diseases: Secondary | ICD-10-CM | POA: Diagnosis not present

## 2019-05-04 DIAGNOSIS — L602 Onychogryphosis: Secondary | ICD-10-CM | POA: Diagnosis not present

## 2019-05-04 DIAGNOSIS — M79671 Pain in right foot: Secondary | ICD-10-CM | POA: Diagnosis not present

## 2019-05-04 DIAGNOSIS — M2042 Other hammer toe(s) (acquired), left foot: Secondary | ICD-10-CM | POA: Diagnosis not present

## 2019-05-04 DIAGNOSIS — M79672 Pain in left foot: Secondary | ICD-10-CM | POA: Diagnosis not present

## 2019-05-10 DIAGNOSIS — Z20828 Contact with and (suspected) exposure to other viral communicable diseases: Secondary | ICD-10-CM | POA: Diagnosis not present

## 2019-05-10 DIAGNOSIS — Z9189 Other specified personal risk factors, not elsewhere classified: Secondary | ICD-10-CM | POA: Diagnosis not present

## 2019-05-13 ENCOUNTER — Non-Acute Institutional Stay (SKILLED_NURSING_FACILITY): Payer: PPO | Admitting: Adult Health

## 2019-05-13 ENCOUNTER — Encounter: Payer: Self-pay | Admitting: Adult Health

## 2019-05-13 DIAGNOSIS — L03115 Cellulitis of right lower limb: Secondary | ICD-10-CM | POA: Diagnosis not present

## 2019-05-13 NOTE — Progress Notes (Signed)
Location:  Occup[facility] psychologist of Service:  SNF (31) Provider:   Cindi Carbon, ANP [place] [phone]   Gayland Curry, DO  Patient Care Team: Gayland Curry, DO as PCP - General (Geriatric Medicine) [name], [name], MD as Consulting Physician (Cardiology) [name], MD as Attending Physician (Gastroenterology) [name], MD as Consulting Physician (Neurology) Community, [facility] ([place]) Royal Hawthorn, NP as Nurse Practitioner (Nurse Practitioner)  Extended Emergency Contact Information Primary Emergency Contact: [name] Address: [address]          [place] Phone: [phone] Mobile Phone: [phone] Relation: Spouse  Code Status:  DNR Goals of care: Advanced Directive information Advanced Directives 04/27/2019  Does Patient Have a Medical Advance Directive? Yes  Type of Advance Directive [place]  Does patient want to make changes to medical advance directive? No - Patient declined  Copy of [place] in Chart? -  Pre-existing out of facility DNR order (yellow form or pink MOST form) -     Chief Complaint  Patient presents with  . Acute Visit    right heel pain    HPI:  Pt is a 86 y.o. male seen today for an acute visit for right heel pain. The nurse noticed today that the right heel was very tender and red. There is swelling and warmth as well. There is a small abrasion to the back of the heel. He reports that he may have hit it on the chair but he can not remember. No fever. Vitals WNL.  Past Medical History:  Diagnosis Date  . Cellulitis   . Chronic diastolic CHF (congestive heart failure) (HCC)    a. EF initially 35-40% after MI 1/09; b. echo 7/08: EF 60%;  c. 11/2014 Echo: EF 65-70%, Gr 1 DD, mild MR.  . Coronary artery disease    a. s/p anterior STEMI 04/2007 with BMS-> LAD;  b. Cath  12/2011 patent stent;  c. low risk nuc 04/2014.  . Crohn's disease ([place])   . Diaphragmatic hernia without mention of obstruction or gangrene   . Diverticulosis of colon (without mention of hemorrhage)   . Fatty liver    a. on ultrasound of 10/2009  . Flatulence, eructation, and gas pain   . Fungal infection   . Gait disorder   . GERD (gastroesophageal reflux disease)   . HOH (hard of hearing)    Hearing aids  . Hyperlipidemia   . Hypertension   . Ischemic cardiomyopathy    a. EF initially 35-40% after MI 1/09; b. echo 7/08: EF 60%;  c. 11/2014 Echo: EF 65-70%, Gr 1 DD, mild MR.  . Melanoma ([place])   . Memory disorder   . Orthostatic hypotension   . Osteoporosis   . Other chronic nonalcoholic liver disease   . Other esophagitis   . Parkinson's disease ([place])   . RBBB 09/02/2013  . Regional enteritis of small intestine ([place])   . Renal calculi   . Restless leg syndrome 03/22/2015  . 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 Urinary incontinence    Past Surgical History:  Procedure Laterality Date  . APPENDECTOMY    . BACK SURGERY     L3, L4, L5  . CARDIAC CATHETERIZATION  09/25/06   EF 35-40% but more recently 60%  . CATARACT EXTRACTION     Bilateral  . CHOLECYSTECTOMY    .  Coronary artery stent placement    . Melanoma resection    . Partial bowel resection    . TONSILLECTOMY      No Known Allergies  Outpatient Encounter Medications as of 05/13/2019  Medication Sig  . acetaminophen (TYLENOL) 325 MG tablet Take 650 mg by mouth at bedtime. And q 6 hrs prn  . aspirin EC 81 MG tablet Take 81 mg by mouth daily.  Marland Kitchen atorvastatin (LIPITOR) 40 MG tablet Take 40 mg by mouth daily.  . calcium carbonate (CALCIUM 600) 600 MG TABS tablet Take 600 mg by mouth daily with breakfast.  . carbidopa-levodopa (SINEMET CR) 50-200 MG tablet Take 1 tablet by mouth 4 (four) times daily.  . Carbidopa-Levodopa ER (SINEMET CR) 25-100 MG tablet controlled release Take 0.5 tablets by mouth 4 (four) times  daily.  . Cholecalciferol (VITAMIN D3) 50 MCG (2000 UT) TABS Take 1 tablet by mouth daily.  . cholestyramine (QUESTRAN) 4 g packet TAKE 1 PACKET BY MOUTH TWICE DAILY WITH A MEAL  . donepezil (ARICEPT) 10 MG tablet TAKE ONE TABLET AT BEDTIME  . ipratropium (ATROVENT) 0.03 % nasal spray Place 2 sprays into both nostrils 3 (three) times daily.  . Melatonin 5 MG TABS Take 1 tablet by mouth at bedtime.   . midodrine (PROAMATINE) 5 MG tablet Take 1 tablet (5 mg total) by mouth 2 (two) times daily with a meal.  . Multiple Vitamins-Minerals (CENTRUM SILVER PO) Take 1 tablet by mouth daily.  . nitroGLYCERIN (NITROSTAT) 0.4 MG SL tablet Place 1 tablet (0.4 mg total) under the tongue every 5 (five) minutes as needed for chest pain. PT OVERDUE FOR OV PLEASE CALL FOR APPT  . pantoprazole (PROTONIX) 40 MG tablet Take 40 mg by mouth daily.   . potassium chloride SA (K-DUR,KLOR-CON) 20 MEQ tablet Take 40 mEq by mouth daily.  . Probiotic Product (ALIGN) 4 MG CAPS Take 1 capsule by mouth daily.   . sodium chloride (OCEAN) 0.65 % SOLN nasal spray Place 2 sprays into both nostrils daily as needed for congestion.   . torsemide (DEMADEX) 20 MG tablet Take 1 tablet (20 mg total) by mouth daily.  . vitamin B-12 (CYANOCOBALAMIN) 1000 MCG tablet Take 1,000 mcg by mouth daily.   No facility-administered encounter medications on file as of 05/13/2019.    Review of Systems  Constitutional: Negative for activity change, appetite change, chills, diaphoresis, fatigue, fever and unexpected weight change.  Respiratory: Negative for cough, shortness of breath, wheezing and stridor.   Cardiovascular: Positive for leg swelling. Negative for chest pain and palpitations.  Gastrointestinal: Negative for abdominal distention, abdominal pain, constipation and diarrhea.  Genitourinary: Negative for difficulty urinating and dysuria.  Musculoskeletal: Positive for arthralgias, gait problem and joint swelling. Negative for back pain and  myalgias.  Neurological: Negative for dizziness, seizures, syncope, facial asymmetry, speech difficulty, weakness and headaches.  Hematological: Negative for adenopathy. Does not bruise/bleed easily.  Psychiatric/Behavioral: Negative for agitation, behavioral problems and confusion.       Memory loss    Immunization History  Administered Date(s) Administered  . Influenza, High Dose Seasonal PF 01/21/2019  . Influenza-Unspecified 12/06/2014, 11/27/2017  . Moderna SARS-COVID-2 Vaccination 04/13/2019  . Pneumococcal Conjugate-13 05/21/2018  . Pneumococcal Polysaccharide-23 05/21/2005  . Tdap 06/03/2006, 10/22/2018  . Zoster Recombinat (Shingrix) 10/22/2018, 12/24/2018   Pertinent  Health Maintenance Due  Topic Date Due  . URINE MICROALBUMIN  01/07/1939  . INFLUENZA VACCINE  Completed  . PNA vac Low Risk Adult  Completed  Fall Risk  10/22/2018 05/12/2018 10/03/2017 12/29/2014  Falls in the past year? 1 1 No Yes  Number falls in past yr: 1 1 - 1  Injury with Fall? 1 0 - No  Risk for fall due to : History of fall(s);Impaired mobility Impaired mobility;History of fall(s);Impaired balance/gait;Mental status change;Medication side effect - Impaired balance/gait  Follow up Falls evaluation completed;Falls prevention discussed Falls evaluation completed;Falls prevention discussed;Education provided - Falls evaluation completed   Functional Status Survey:    Vitals:   05/13/19 1217  BP: (!) 144/81  Pulse: 80  Resp: 18  Temp: 97.7 F (36.5 C)   There is no height or weight on file to calculate BMI. Physical Exam Vitals and nursing note reviewed.  Musculoskeletal:     Right lower leg: Edema present.     Left lower leg: Edema present.  Skin:    General: Skin is warm and dry.     Findings: Erythema (right heel ) present.     Comments: Right heel with small abrasion which is closed. 100% pink tissue. No open areas or drainage. The right heel is tender and warm. The right foot is slightly  more swollen than the left.   Neurological:     Mental Status: He is alert and oriented to person, place, and time.     Labs reviewed: Recent Labs    08/24/18 0000  NA 139  K 4.1  BUN 17  CREATININE 1.1   Recent Labs    08/24/18 0000  AST 48*  ALT 23  ALKPHOS 88   No results for input(s): WBC, NEUTROABS, HGB, HCT, MCV, PLT in the last 8760 hours. Lab Results  Component Value Date   TSH 2.428 11/12/2014   No results found for: HGBA1C Lab Results  Component Value Date   CHOL 129 08/24/2018   HDL 55 08/24/2018   LDLCALC 44 08/24/2018   TRIG 153 08/24/2018    Significant Diagnostic Results in last 30 days:  No results found.  Assessment/Plan 1. Cellulitis of right heel No signs of severe injury that would indicate the need for an xray. Keflex 500 mg QID x 7 days and monitor for improvement.    Family/ staff Communication: resident   Labs/tests ordered:  NA

## 2019-05-20 ENCOUNTER — Non-Acute Institutional Stay (SKILLED_NURSING_FACILITY): Payer: PPO | Admitting: Adult Health

## 2019-05-20 ENCOUNTER — Encounter: Payer: Self-pay | Admitting: Adult Health

## 2019-05-20 DIAGNOSIS — I10 Essential (primary) hypertension: Secondary | ICD-10-CM

## 2019-05-20 DIAGNOSIS — L03115 Cellulitis of right lower limb: Secondary | ICD-10-CM | POA: Diagnosis not present

## 2019-05-20 DIAGNOSIS — I5032 Chronic diastolic (congestive) heart failure: Secondary | ICD-10-CM | POA: Diagnosis not present

## 2019-05-20 DIAGNOSIS — I2510 Atherosclerotic heart disease of native coronary artery without angina pectoris: Secondary | ICD-10-CM | POA: Diagnosis not present

## 2019-05-20 DIAGNOSIS — G20 Parkinson's disease: Secondary | ICD-10-CM

## 2019-05-20 DIAGNOSIS — R609 Edema, unspecified: Secondary | ICD-10-CM

## 2019-05-20 DIAGNOSIS — E782 Mixed hyperlipidemia: Secondary | ICD-10-CM

## 2019-05-20 NOTE — Progress Notes (Signed)
Location:  Occupational psychologist of Service:  SNF (31) Provider:  Cindi Carbon, ANP [place] [phone]   Gayland Curry, DO  Patient Care Team: Gayland Curry, DO as PCP - General (Geriatric Medicine) [name], [name], MD as Consulting Physician (Cardiology) [name], MD as Attending Physician (Gastroenterology) [name], MD as Consulting Physician (Neurology) Community, [facility] ([place]) Royal Hawthorn, NP as Nurse Practitioner (Nurse Practitioner)  Extended Emergency Contact Information Primary Emergency Contact: [name] Address: [address]          [place] Phone: [phone] Mobile Phone: [phone] Relation: Spouse  Code Status:  DNR Goals of care: Advanced Directive information Advanced Directives 04/27/2019  Does Patient Have a Medical Advance Directive? Yes  Type of Advance Directive [place]  Does patient want to make changes to medical advance directive? No - Patient declined  Copy of [place] in Chart? -  Pre-existing out of facility DNR order (yellow form or pink MOST form) -     Chief Complaint  Patient presents with  . Medical Management of Chronic Issues    HPI:  Pt is a 86 y.o. male seen today for medical management of chronic diseases.    He was treated for right heel cellulitis one week ago with keflex and he reports the pain and warmth has resolved. He is going to see dermatology about a new skin lesion on the right temple. He also reports he is going to the oral surgeon to fix a broken tooth. He has no acute complaints for the visit today. He just participate in a music/dance session at [facility] and had a great time.   CAD: s/p anterior STEMI in 2009 with BMS to the LAD, currently on asa and lipitor. No reports of CP, DOE, or SOB  Diastolic CHF: EF 46-56%. No increase in  weight. Has chronic edema in both legs that is mild and unchanged   Past Medical History:  Diagnosis Date  . Cellulitis   . Chronic diastolic CHF (congestive heart failure) (HCC)    a. EF initially 35-40% after MI 1/09; b. echo 7/08: EF 60%;  c. 11/2014 Echo: EF 65-70%, Gr 1 DD, mild MR.  . Coronary artery disease    a. s/p anterior STEMI 04/2007 with BMS-> LAD;  b. Cath 12/2011 patent stent;  c. low risk nuc 04/2014.  . Crohn's disease ([place])   . Diaphragmatic hernia without mention of obstruction or gangrene   . Diverticulosis of colon (without mention of hemorrhage)   . Fatty liver    a. on ultrasound of 10/2009  . Flatulence, eructation, and gas pain   . Fungal infection   . Gait disorder   . GERD (gastroesophageal reflux disease)   . HOH (hard of hearing)    Hearing aids  . Hyperlipidemia   . Hypertension   . Ischemic cardiomyopathy    a. EF initially 35-40% after MI 1/09; b. echo 7/08: EF 60%;  c. 11/2014 Echo: EF 65-70%, Gr 1 DD, mild MR.  . Melanoma ([place])   . Memory disorder   . Orthostatic hypotension   . Osteoporosis   . Other chronic nonalcoholic liver disease   . Other esophagitis   . Parkinson's disease ([place])   . RBBB 09/02/2013  . Regional enteritis of small intestine ([place])   . Renal calculi   . Restless leg syndrome 03/22/2015  .  Ulcerative (chronic) ileocolitis ([place])   . Urinary incontinence    Past Surgical History:  Procedure Laterality Date  . APPENDECTOMY    . BACK SURGERY     L3, L4, L5  . CARDIAC CATHETERIZATION  09/25/06   EF 35-40% but more recently 60%  . CATARACT EXTRACTION     Bilateral  . CHOLECYSTECTOMY    . Coronary artery stent placement    . Melanoma resection    . Partial bowel resection    . TONSILLECTOMY      No Known Allergies  Outpatient Encounter Medications as of 05/20/2019  Medication Sig  . acetaminophen (TYLENOL) 325 MG tablet Take 650 mg by mouth at bedtime. And q 6 hrs prn  . aspirin EC 81 MG tablet Take 81 mg by mouth  daily.  Marland Kitchen atorvastatin (LIPITOR) 40 MG tablet Take 40 mg by mouth daily.  . calcium carbonate (CALCIUM 600) 600 MG TABS tablet Take 600 mg by mouth daily with breakfast.  . carbidopa-levodopa (SINEMET CR) 50-200 MG tablet Take 1 tablet by mouth 4 (four) times daily.  . Carbidopa-Levodopa ER (SINEMET CR) 25-100 MG tablet controlled release Take 0.5 tablets by mouth 4 (four) times daily.  . cephALEXin (KEFLEX) 500 MG capsule Take 500 mg by mouth 4 (four) times daily.  . Cholecalciferol (VITAMIN D3) 50 MCG (2000 UT) TABS Take 1 tablet by mouth daily.  . cholestyramine (QUESTRAN) 4 g packet TAKE 1 PACKET BY MOUTH TWICE DAILY WITH A MEAL  . donepezil (ARICEPT) 10 MG tablet TAKE ONE TABLET AT BEDTIME  . ipratropium (ATROVENT) 0.03 % nasal spray Place 2 sprays into both nostrils 3 (three) times daily.  . Melatonin 5 MG TABS Take 1 tablet by mouth at bedtime.   . midodrine (PROAMATINE) 5 MG tablet Take 1 tablet (5 mg total) by mouth 2 (two) times daily with a meal.  . Multiple Vitamins-Minerals (CENTRUM SILVER PO) Take 1 tablet by mouth daily.  . nitroGLYCERIN (NITROSTAT) 0.4 MG SL tablet Place 1 tablet (0.4 mg total) under the tongue every 5 (five) minutes as needed for chest pain. PT OVERDUE FOR OV PLEASE CALL FOR APPT  . pantoprazole (PROTONIX) 40 MG tablet Take 40 mg by mouth daily.   . potassium chloride SA (K-DUR,KLOR-CON) 20 MEQ tablet Take 40 mEq by mouth daily.  . Probiotic Product (ALIGN) 4 MG CAPS Take 1 capsule by mouth daily.   . sodium chloride (OCEAN) 0.65 % SOLN nasal spray Place 2 sprays into both nostrils daily as needed for congestion.   . torsemide (DEMADEX) 20 MG tablet Take 1 tablet (20 mg total) by mouth daily.  . vitamin B-12 (CYANOCOBALAMIN) 1000 MCG tablet Take 1,000 mcg by mouth daily.   No facility-administered encounter medications on file as of 05/20/2019.    Review of Systems  Constitutional: Negative for activity change, appetite change, chills, diaphoresis, fatigue,  fever and unexpected weight change.  Respiratory: Negative for cough, shortness of breath, wheezing and stridor.   Cardiovascular: Positive for leg swelling. Negative for chest pain and palpitations.  Gastrointestinal: Negative for abdominal distention, abdominal pain, constipation and diarrhea.  Genitourinary: Negative for difficulty urinating and dysuria.  Musculoskeletal: Positive for gait problem. Negative for arthralgias, back pain, joint swelling and myalgias.  Skin:       Skin lesion on temple  Neurological: Negative for dizziness, seizures, syncope, facial asymmetry, speech difficulty, weakness and headaches.  Hematological: Negative for adenopathy. Does not bruise/bleed easily.  Psychiatric/Behavioral: Negative for agitation and behavioral problems.  Memory loss    Immunization History  Administered Date(s) Administered  . Influenza, High Dose Seasonal PF 01/21/2019  . Influenza-Unspecified 12/06/2014, 11/27/2017  . Moderna SARS-COVID-2 Vaccination 04/13/2019, 05/11/2019  . Pneumococcal Conjugate-13 05/21/2018  . Pneumococcal Polysaccharide-23 05/21/2005  . Tdap 06/03/2006, 10/22/2018  . Zoster Recombinat (Shingrix) 10/22/2018, 12/24/2018   Pertinent  Health Maintenance Due  Topic Date Due  . URINE MICROALBUMIN  06/17/2019 (Originally 01/07/1939)  . INFLUENZA VACCINE  Completed  . PNA vac Low Risk Adult  Completed   Fall Risk  10/22/2018 05/12/2018 10/03/2017 12/29/2014  Falls in the past year? 1 1 No Yes  Number falls in past yr: 1 1 - 1  Injury with Fall? 1 0 - No  Risk for fall due to : History of fall(s);Impaired mobility Impaired mobility;History of fall(s);Impaired balance/gait;Mental status change;Medication side effect - Impaired balance/gait  Follow up Falls evaluation completed;Falls prevention discussed Falls evaluation completed;Falls prevention discussed;Education provided - Falls evaluation completed   Functional Status Survey:    Vitals:   05/20/19 1534   Weight: 177 lb 6.4 oz (80.5 kg)   Body mass index is 26.2 kg/m.  Wt Readings from Last 3 Encounters:  05/20/19 177 lb 6.4 oz (80.5 kg)  04/27/19 176 lb 6.4 oz (80 kg)  03/22/19 177 lb 3.2 oz (80.4 kg)    Physical Exam Constitutional:      General: He is not in acute distress.    Appearance: He is not diaphoretic.  HENT:     Head: Normocephalic and atraumatic.  Neck:     Thyroid: No thyromegaly.     Vascular: No JVD.     Trachea: No tracheal deviation.  Cardiovascular:     Rate and Rhythm: Normal rate and regular rhythm.     Heart sounds: No murmur.  Pulmonary:     Effort: Pulmonary effort is normal. No respiratory distress.     Breath sounds: Normal breath sounds. No wheezing.  Abdominal:     General: Bowel sounds are normal. There is no distension.     Palpations: Abdomen is soft.     Tenderness: There is no abdominal tenderness.     Comments: rotund  Musculoskeletal:     Right lower leg: Edema (+1) present.     Left lower leg: Edema (+1) present.  Lymphadenopathy:     Cervical: No cervical adenopathy.  Skin:    General: Skin is warm and dry.     Findings: No rash.     Comments: Right temple with raised lesion papular with mild erythema. NO drainage. Not tender.   Neurological:     Mental Status: He is alert and oriented to person, place, and time.     Cranial Nerves: No cranial nerve deficit.     Comments: BUE and LLE without cogwheeling or rigidity  Psychiatric:        Mood and Affect: Mood normal.     Labs reviewed: Recent Labs    08/24/18 0000  NA 139  K 4.1  BUN 17  CREATININE 1.1   Recent Labs    08/24/18 0000  AST 48*  ALT 23  ALKPHOS 88   No results for input(s): WBC, NEUTROABS, HGB, HCT, MCV, PLT in the last 8760 hours. Lab Results  Component Value Date   TSH 2.428 11/12/2014   No results found for: HGBA1C Lab Results  Component Value Date   CHOL 129 08/24/2018   HDL 55 08/24/2018   LDLCALC 44 08/24/2018   TRIG 153 08/24/2018  Significant Diagnostic Results in last 30 days:  No results found.  Assessment/Plan  1. Cellulitis of right heel Resolved  2. Chronic diastolic CHF (congestive heart failure) (HCC) Wt Readings from Last 3 Encounters:  05/20/19 177 lb 6.4 oz (80.5 kg)  04/27/19 176 lb 6.4 oz (80 kg)  03/22/19 177 lb 3.2 oz (80.4 kg)  Continue torsemide 20 mg qd   3. Coronary artery disease involving native coronary artery of native heart without angina pectoris Continue baby aspirin and lipitor   4. Essential hypertension Controlled  5. Mixed hyperlipidemia Lab Results  Component Value Date   LDLCALC 44 08/24/2018    Check lipid panel annually Continue atorvastatin 40 mg qd   6. Parkinson disease ([place]) Will be following up with Dr. Jannifer Franklin soon. Doing well on his current regimen with no increase in rigidity or tremor.     Family/ staff Communication: nurse/resident   Labs/tests ordered:  NA

## 2019-05-21 DIAGNOSIS — Z85820 Personal history of malignant melanoma of skin: Secondary | ICD-10-CM | POA: Diagnosis not present

## 2019-05-21 DIAGNOSIS — C44529 Squamous cell carcinoma of skin of other part of trunk: Secondary | ICD-10-CM | POA: Diagnosis not present

## 2019-05-21 DIAGNOSIS — D485 Neoplasm of uncertain behavior of skin: Secondary | ICD-10-CM | POA: Diagnosis not present

## 2019-05-21 DIAGNOSIS — L570 Actinic keratosis: Secondary | ICD-10-CM | POA: Diagnosis not present

## 2019-05-21 DIAGNOSIS — C44329 Squamous cell carcinoma of skin of other parts of face: Secondary | ICD-10-CM | POA: Diagnosis not present

## 2019-05-21 DIAGNOSIS — Z85828 Personal history of other malignant neoplasm of skin: Secondary | ICD-10-CM | POA: Diagnosis not present

## 2019-05-21 DIAGNOSIS — L821 Other seborrheic keratosis: Secondary | ICD-10-CM | POA: Diagnosis not present

## 2019-05-21 DIAGNOSIS — C44622 Squamous cell carcinoma of skin of right upper limb, including shoulder: Secondary | ICD-10-CM | POA: Diagnosis not present

## 2019-05-24 ENCOUNTER — Ambulatory Visit: Payer: PPO | Admitting: Neurology

## 2019-05-24 ENCOUNTER — Other Ambulatory Visit: Payer: Self-pay

## 2019-05-24 ENCOUNTER — Telehealth: Payer: Self-pay | Admitting: Neurology

## 2019-05-24 ENCOUNTER — Encounter: Payer: Self-pay | Admitting: Neurology

## 2019-05-24 VITALS — BP 121/69 | HR 102 | Temp 97.6°F | Ht 69.0 in

## 2019-05-24 DIAGNOSIS — Z9889 Other specified postprocedural states: Secondary | ICD-10-CM | POA: Diagnosis not present

## 2019-05-24 DIAGNOSIS — G20 Parkinson's disease: Secondary | ICD-10-CM

## 2019-05-24 NOTE — Patient Instructions (Addendum)
Continue taking the Sinemet CR 50-200 mg, increase the Sinemet IR 25-100 to 1 tablet 4 times daily Start taking 1 tablet in the morning, 1/2 tablet 3 times during the day with the CR x 1 week Then take 1 tablet in the morning and midday, 1/2 tablet in the afternoon and evening x 1 week Then take 1 tablet in the morning, midday, afternoon, and 1/2 tablet evening x 1 week Then take 1 tablet 4 times daily along with with Sinemet CR   Try to increase your activity as tolerated

## 2019-05-24 NOTE — Telephone Encounter (Signed)
Please call [facility], I am increase Sinemet IR 25-100 mg, slowly over 4 weeks, please make sure they are aware.

## 2019-05-24 NOTE — Progress Notes (Signed)
PATIENT: [name] [name] DOB: Aug 13, 1928  REASON FOR VISIT: follow up HISTORY FROM: patient  HISTORY OF PRESENT ILLNESS: Today 05/24/19  [name] [name] is an 86 year old male with history of Parkinson's disease.  He is taking Aricept for memory.  He is taking ProAmantine for orthostatic hypotension, denies any fainting events.  He has had a decline in his ability to ambulate due to isolation from the [place] pandemic.  He has had occasional falls during transfers, once his foot got hung.  He is able to use a walker to ambulate, but requires assistance.  He is working with physical therapy twice a week, very expensive.  He remains on Sinemet CR 50/200 mg tablets, 1 tablet 4 times daily, Sinemet IR 25/100 mg tablet 1/2 tablet 4 times a day with the CR Sinemet.  With his walking, he reports some shuffling gait, more weakness.  He has a good appetite, is able to feed himself.  He says he sleeps well at night.  He requires assistance with his daily activities.  He denies any hallucinations.  He presents today for evaluation accompanied by his wife.  HISTORY 12/16/2018 Dr. Jannifer Franklin: [name] [name] is an 86 year old right-handed white male with a history of Parkinson's disease.  He has had a decline in his ability to ambulate since the COVID virus pandemic.  The patient has fallen on occasion, he uses a walker to get around.  He is getting physical therapy twice a week to maintain his physical abilities.  He has not sustained any injuries with the falls.  He is on Sinemet CR 50/200 mg tablets taking 1 tablet 4 times daily and he takes one half of a Sinemet 25/100 mg tablet 4 times daily with the CR.  He takes Aricept 10 mg daily for memory.  He is having a runny nose on the medication but otherwise tolerates the drug.  He is on ProAmatine for orthostatic hypotension, he has not had any fainting events..  He returns to the office today for an evaluation.  He is sleeping fairly well at night.   REVIEW OF SYSTEMS: Out of a  complete 14 system review of symptoms, the patient complains only of the following symptoms, and all other reviewed systems are negative.  Gait abnormality, memory loss  ALLERGIES: No Known Allergies  HOME MEDICATIONS: Outpatient Medications Prior to Visit  Medication Sig Dispense Refill  . acetaminophen (TYLENOL) 325 MG tablet Take 650 mg by mouth at bedtime. And q 6 hrs prn    . aspirin EC 81 MG tablet Take 81 mg by mouth daily.    Marland Kitchen atorvastatin (LIPITOR) 40 MG tablet Take 40 mg by mouth daily.    . calcium carbonate (CALCIUM 600) 600 MG TABS tablet Take 600 mg by mouth daily with breakfast.    . carbidopa-levodopa (SINEMET CR) 50-200 MG tablet Take 1 tablet by mouth 4 (four) times daily. 120 tablet 5  . carbidopa-levodopa (SINEMET IR) 25-100 MG tablet Take 1 tablet by mouth 4 (four) times daily.    . Cholecalciferol (VITAMIN D3) 50 MCG (2000 UT) TABS Take 1 tablet by mouth daily.    . cholestyramine (QUESTRAN) 4 g packet TAKE 1 PACKET BY MOUTH TWICE DAILY WITH A MEAL 60 each 11  . donepezil (ARICEPT) 10 MG tablet TAKE ONE TABLET AT BEDTIME 90 tablet 0  . ipratropium (ATROVENT) 0.03 % nasal spray Place 2 sprays into both nostrils 3 (three) times daily.    . Melatonin 5 MG TABS Take 1 tablet  by mouth at bedtime.     . midodrine (PROAMATINE) 5 MG tablet Take 1 tablet (5 mg total) by mouth 2 (two) times daily with a meal. 60 tablet 9  . Multiple Vitamins-Minerals (CENTRUM SILVER PO) Take 1 tablet by mouth daily.    . nitroGLYCERIN (NITROSTAT) 0.4 MG SL tablet Place 1 tablet (0.4 mg total) under the tongue every 5 (five) minutes as needed for chest pain. PT OVERDUE FOR OV PLEASE CALL FOR APPT 25 tablet 0  . pantoprazole (PROTONIX) 40 MG tablet Take 40 mg by mouth daily.     . potassium chloride SA (K-DUR,KLOR-CON) 20 MEQ tablet Take 40 mEq by mouth daily.    . Probiotic Product (ALIGN) 4 MG CAPS Take 1 capsule by mouth daily.     . sodium chloride (OCEAN) 0.65 % SOLN nasal spray Place 2  sprays into both nostrils daily as needed for congestion.     . torsemide (DEMADEX) 20 MG tablet Take 1 tablet (20 mg total) by mouth daily. 180 tablet 3  . vitamin B-12 (CYANOCOBALAMIN) 1000 MCG tablet Take 1,000 mcg by mouth daily.    . Carbidopa-Levodopa ER (SINEMET CR) 25-100 MG tablet controlled release Take 0.5 tablets by mouth 4 (four) times daily.    . cephALEXin (KEFLEX) 500 MG capsule Take 500 mg by mouth 4 (four) times daily.     No facility-administered medications prior to visit.    PAST MEDICAL HISTORY: Past Medical History:  Diagnosis Date  . Cellulitis   . Chronic diastolic CHF (congestive heart failure) (HCC)    a. EF initially 35-40% after MI 1/09; b. echo 7/08: EF 60%;  c. 11/2014 Echo: EF 65-70%, Gr 1 DD, mild MR.  . Coronary artery disease    a. s/p anterior STEMI 04/2007 with BMS-> LAD;  b. Cath 12/2011 patent stent;  c. low risk nuc 04/2014.  . Crohn's disease ([place])   . Diaphragmatic hernia without mention of obstruction or gangrene   . Diverticulosis of colon (without mention of hemorrhage)   . Fatty liver    a. on ultrasound of 10/2009  . Flatulence, eructation, and gas pain   . Fungal infection   . Gait disorder   . GERD (gastroesophageal reflux disease)   . HOH (hard of hearing)    Hearing aids  . Hyperlipidemia   . Hypertension   . Ischemic cardiomyopathy    a. EF initially 35-40% after MI 1/09; b. echo 7/08: EF 60%;  c. 11/2014 Echo: EF 65-70%, Gr 1 DD, mild MR.  . Melanoma ([place])   . Memory disorder   . Orthostatic hypotension   . Osteoporosis   . Other chronic nonalcoholic liver disease   . Other esophagitis   . Parkinson's disease ([place])   . RBBB 09/02/2013  . Regional enteritis of small intestine ([place])   . Renal calculi   . Restless leg syndrome 03/22/2015  . 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 Urinary incontinence     PAST SURGICAL HISTORY: Past Surgical History:  Procedure Laterality Date  . APPENDECTOMY    . BACK SURGERY     L3,  L4, L5  . CARDIAC CATHETERIZATION  09/25/06   EF 35-40% but more recently 60%  . CATARACT EXTRACTION     Bilateral  . CHOLECYSTECTOMY    . Coronary artery stent placement    . Melanoma resection    . Partial bowel resection    . TONSILLECTOMY      FAMILY HISTORY: Family History  Problem Relation Age of Onset  . Heart failure Mother   . Heart attack Mother   . Hypertension Mother   . Emphysema Father   . Heart disease Brother   . Heart disease Brother   . Coronary artery disease Other   . Colon cancer Brother        older at dx  . Stomach cancer Neg Hx     SOCIAL HISTORY: Social History   Socioeconomic History  . Marital status: Married    Spouse name: [name]  . Number of children: 3  . Years of education: 16  . Highest education level: Bachelor's degree (e.g., BA, AB, BS)  Occupational History  . Occupation: CEO-retired    Employer: [employer]  . Occupation: Licensed conveyancer: [employer]  Tobacco Use  . Smoking status: Never Smoker  . Smokeless tobacco: Never Used  Substance and Sexual Activity  . Alcohol use: Yes    Alcohol/week: 2.0 - 3.0 standard drinks    Types: 2 - 3 Standard drinks or equivalent per week    Comment: occassionally  . Drug use: No  . Sexual activity: Not Currently  Other Topics Concern  . Not on file  Social History Narrative   Lives w/ his wife at [place] [facility]   Patient is right handed.   Patient drinks very little caffeine.   Social Determinants of Health   Financial Resource Strain:   . Difficulty of Paying Living Expenses: Not on file  Food Insecurity:   . Worried About Charity fundraiser in the Last Year: Not on file  . Ran Out of Food in the Last Year: Not on file  Transportation Needs:   . Lack of Transportation (Medical): Not on file  . Lack of Transportation (Non-Medical): Not on file  Physical Activity:   . Days of Exercise per Week: Not on file  . Minutes of Exercise per Session: Not on  file  Stress:   . Feeling of Stress : Not on file  Social Connections:   . Frequency of Communication with Friends and Family: Not on file  . Frequency of Social Gatherings with Friends and Family: Not on file  . Attends Religious Services: Not on file  . Active Member of Clubs or Organizations: Not on file  . Attends Archivist Meetings: Not on file  . Marital Status: Not on file  Intimate Partner Violence:   . Fear of Current or Ex-Partner: Not on file  . Emotionally Abused: Not on file  . Physically Abused: Not on file  . Sexually Abused: Not on file      PHYSICAL EXAM  Vitals:   05/24/19 1459  BP: 121/69  Pulse: (!) 102  Temp: 97.6 F (36.4 C)  TempSrc: Oral  Height: 5' 9"  (1.753 m)   Body mass index is 26.2 kg/m.  Generalized: Well developed, in no acute distress  MMSE - Mini Mental State Exam 05/24/2019 12/16/2018 10/22/2018  Not completed: (No Data) - -  Orientation to time 4 3 4   Orientation to Place 4 5 5   Registration 3 3 3   Attention/ Calculation 5 5 5   Recall 1 3 3   Language- name 2 objects 2 2 2   Language- repeat 1 0 1  Language- follow 3 step command 3 3 3   Language- read & follow direction 1 1 1   Write a sentence 1 1 1   Copy design 1 1 1   Total score 26 27 29  Neurological examination  Mentation: Alert oriented to time, place, history taking. Follows all commands speech and language fluent.  No masking of the face is seen. Cranial nerve II-XII: Pupils were equal round reactive to light. Extraocular movements were full, visual field were full on confrontational test. Facial sensation and strength were normal. Head turning and shoulder shrug were normal and symmetric. Motor: Good strength of all extremities.  No tremor was noted. Sensory: Sensory testing is intact to soft touch on all 4 extremities. No evidence of extinction is noted.  Coordination: Cerebellar testing reveals good finger-nose-finger and heel-to-shin bilaterally.  Gait and  station: He needs assistance to stand, is slow to rise, he is in a wheelchair today, when standing with 2 person assist, can take a few steps, shuffling gait noted.  Tandem gait was not attempted. Reflexes: Deep tendon reflexes are symmetric    DIAGNOSTIC DATA (LABS, IMAGING, TESTING) - I reviewed patient records, labs, notes, testing and imaging myself where available.  Lab Results  Component Value Date   WBC 9.6 02/09/2018   HGB 14.1 02/09/2018   HCT 41.8 02/09/2018   MCV 92.7 02/09/2018   PLT 304.0 02/09/2018      Component Value Date/Time   NA 139 08/24/2018 0000   K 4.1 08/24/2018 0000   CL 97 02/09/2018 1251   CO2 31 02/09/2018 1251   GLUCOSE 127 (H) 02/09/2018 1251   BUN 17 08/24/2018 0000   CREATININE 1.1 08/24/2018 0000   CREATININE 1.26 02/09/2018 1251   CREATININE 0.94 04/05/2015 1051   CALCIUM 8.6 02/09/2018 1251   PROT 6.7 12/18/2017 0533   PROT 6.8 12/13/2014 0846   PROT 6.7 12/13/2014 0846   ALBUMIN 3.3 (L) 12/18/2017 0533   ALBUMIN 4.1 12/13/2014 0846   ALBUMIN 4.1 12/13/2014 0846   AST 48 (A) 08/24/2018 0000   ALT 23 08/24/2018 0000   ALKPHOS 88 08/24/2018 0000   BILITOT 1.8 (H) 12/18/2017 0533   BILITOT 0.9 12/13/2014 0846   BILITOT 0.7 12/13/2014 0846   GFRNONAA >60 12/20/2017 0522   GFRAA >60 12/20/2017 0522   Lab Results  Component Value Date   CHOL 129 08/24/2018   HDL 55 08/24/2018   LDLCALC 44 08/24/2018   TRIG 153 08/24/2018   No results found for: HGBA1C Lab Results  Component Value Date   VITAMINB12 469 12/13/2014   Lab Results  Component Value Date   TSH 2.428 11/12/2014      ASSESSMENT AND PLAN 86 y.o. year old male  has a past medical history of Cellulitis, Chronic diastolic CHF (congestive heart failure) ([place]), Coronary artery disease, Crohn's disease ([place]), Diaphragmatic hernia without mention of obstruction or gangrene, Diverticulosis of colon (without mention of hemorrhage), Fatty liver, Flatulence, eructation, and gas  pain, Fungal infection, Gait disorder, GERD (gastroesophageal reflux disease), HOH (hard of hearing), Hyperlipidemia, Hypertension, Ischemic cardiomyopathy, Melanoma ([place]), Memory disorder, Orthostatic hypotension, Osteoporosis, Other chronic nonalcoholic liver disease, Other esophagitis, Parkinson's disease ([place]), RBBB (09/02/2013), Regional enteritis of small intestine ([place]), Renal calculi, Restless leg syndrome (03/22/2015), 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 here with:  1.  Parkinson's disease 2.  Gait disturbance 3.  Memory disturbance  I have encouraged him to try to be as active as possible, he is in therapy twice a week, due to the expense, he likely will not be able to do more.  We discussed the importance of doing exercises on his own safely.  I will increase his Sinemet 25-100 IR to 1 tablet 4 times a  day to take along with his Sinemet CR 50-200 mg 4 times daily.  His memory is stable.  He will remain on Aricept.  He will return to the office in 4 months or sooner if needed.   We will increase the Sinemet IR 25-100 mg tablet, by 1 tablet each week along with his maintenance dose of Sinemet CR 1.  Take 1 tablet in the morning, 1/2 tablet 3 times daily x 1 week 2.  Take 1 tablet twice daily, 1/2 tablet twice daily x 1 week 3.  Take 1 tablet 3 times daily, 1/2 tablet daily x1 week 4.  Take 1 tablet 4 times daily   I spent 25 minutes with the patient. 50% of this time was spent discussing his plan of care.  Butler Denmark, AGNP-C, DNP 05/24/2019, 4:38 PM [facility] [address], [place], [place] [address] [phone]

## 2019-05-24 NOTE — Progress Notes (Signed)
I have read the note, and I agree with the clinical assessment and plan.  Samani Deal K Adal Sereno   

## 2019-05-25 NOTE — Telephone Encounter (Signed)
I called Collie Siad at [employer] re: med: sinemet medication titration.  Collie Siad not avaialble when called and gave the message to Manuela Schwartz, and faxed to [phone] ofv note with the instructions listed.  She is to call back. [phone].  [phone].

## 2019-06-10 ENCOUNTER — Encounter: Payer: Self-pay | Admitting: Adult Health

## 2019-06-10 ENCOUNTER — Non-Acute Institutional Stay (SKILLED_NURSING_FACILITY): Payer: PPO | Admitting: Adult Health

## 2019-06-10 DIAGNOSIS — K5000 Crohn's disease of small intestine without complications: Secondary | ICD-10-CM

## 2019-06-10 DIAGNOSIS — F0280 Dementia in other diseases classified elsewhere without behavioral disturbance: Secondary | ICD-10-CM | POA: Diagnosis not present

## 2019-06-10 DIAGNOSIS — G20 Parkinson's disease: Secondary | ICD-10-CM

## 2019-06-10 DIAGNOSIS — I10 Essential (primary) hypertension: Secondary | ICD-10-CM

## 2019-06-10 DIAGNOSIS — I5032 Chronic diastolic (congestive) heart failure: Secondary | ICD-10-CM

## 2019-06-10 DIAGNOSIS — C44320 Squamous cell carcinoma of skin of unspecified parts of face: Secondary | ICD-10-CM | POA: Diagnosis not present

## 2019-06-10 NOTE — Progress Notes (Signed)
Location:  Occupational psychologist of Service:  SNF (31) Provider:   Cindi Carbon, ANP [facility] [phone]   Gayland Curry, DO  Patient Care Team: Gayland Curry, DO as PCP - General (Geriatric Medicine) [name], [name], MD as Consulting Physician (Cardiology) [name], MD as Attending Physician (Gastroenterology) [name], MD as Consulting Physician (Neurology) Community, [facility] ([place]) Royal Hawthorn, NP as Nurse Practitioner (Nurse Practitioner)  Extended Emergency Contact Information Primary Emergency Contact: [name] Address: [address]          [place] Phone: [phone] Mobile Phone: [phone] Relation: Spouse  Code Status:  DNR Goals of care: Advanced Directive information Advanced Directives 04/27/2019  Does Patient Have a Medical Advance Directive? Yes  Type of Advance Directive [place]  Does patient want to make changes to medical advance directive? No - Patient declined  Copy of [place] in Chart? -  Pre-existing out of facility DNR order (yellow form or pink MOST form) -     Chief Complaint  Patient presents with  . Medical Management of Chronic Issues    HPI:  Pt is a 86 y.o. male seen today for medical management of chronic diseases.    Recently seen by Neurology (2/15) and sinemet dose increased on a slow titration upward.  No s/e at this time. Has some issue with slow movements in transfers and falls.   Right temple lesion bx by derm revealed SCC 06/09/19, as well as two lesions to his arm and one to the supraclavicular area. He is going to have skin surgery later this month on the temple lesion. No issues with pain or drainage at this time but the area appears to be growing rather quickly.  Denies any sob, cp, increased edema, pnd, or doe.   BP controlled.   No issues  with abd pain or diarrhea. Has period "blow outs" per staff but no increase in frequency.   Past Medical History:  Diagnosis Date  . Cellulitis   . Chronic diastolic CHF (congestive heart failure) (HCC)    a. EF initially 35-40% after MI 1/09; b. echo 7/08: EF 60%;  c. 11/2014 Echo: EF 65-70%, Gr 1 DD, mild MR.  . Coronary artery disease    a. s/p anterior STEMI 04/2007 with BMS-> LAD;  b. Cath 12/2011 patent stent;  c. low risk nuc 04/2014.  . Crohn's disease ([place])   . Diaphragmatic hernia without mention of obstruction or gangrene   . Diverticulosis of colon (without mention of hemorrhage)   . Fatty liver    a. on ultrasound of 10/2009  . Flatulence, eructation, and gas pain   . Fungal infection   . Gait disorder   . GERD (gastroesophageal reflux disease)   . HOH (hard of hearing)    Hearing aids  . Hyperlipidemia   . Hypertension   . Ischemic cardiomyopathy    a. EF initially 35-40% after MI 1/09; b. echo 7/08: EF 60%;  c. 11/2014 Echo: EF 65-70%, Gr 1 DD, mild MR.  . Melanoma ([place])   . Memory disorder   . Orthostatic hypotension   . Osteoporosis   . Other chronic nonalcoholic liver disease   . Other esophagitis   . Parkinson's disease ([place])   . RBBB 09/02/2013  . Regional enteritis of small intestine ([place])   . Renal calculi   . Restless leg  syndrome 03/22/2015  . 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 Urinary incontinence    Past Surgical History:  Procedure Laterality Date  . APPENDECTOMY    . BACK SURGERY     L3, L4, L5  . CARDIAC CATHETERIZATION  09/25/06   EF 35-40% but more recently 60%  . CATARACT EXTRACTION     Bilateral  . CHOLECYSTECTOMY    . Coronary artery stent placement    . Melanoma resection    . Partial bowel resection    . TONSILLECTOMY      No Known Allergies  Outpatient Encounter Medications as of 06/10/2019  Medication Sig  . acetaminophen (TYLENOL) 325 MG tablet Take 650 mg by mouth at bedtime. And q 6 hrs prn  . aspirin EC 81 MG tablet  Take 81 mg by mouth daily.  Marland Kitchen atorvastatin (LIPITOR) 40 MG tablet Take 40 mg by mouth daily.  . calcium carbonate (CALCIUM 600) 600 MG TABS tablet Take 600 mg by mouth daily with breakfast.  . carbidopa-levodopa (SINEMET CR) 50-200 MG tablet Take 1 tablet by mouth 4 (four) times daily.  . chlorhexidine (PERIDEX) 0.12 % solution Use as directed 15 mLs in the mouth or throat 2 (two) times daily.  . Cholecalciferol (VITAMIN D3) 50 MCG (2000 UT) TABS Take 1 tablet by mouth daily.  . cholestyramine (QUESTRAN) 4 g packet TAKE 1 PACKET BY MOUTH TWICE DAILY WITH A MEAL  . donepezil (ARICEPT) 10 MG tablet TAKE ONE TABLET AT BEDTIME  . ipratropium (ATROVENT) 0.03 % nasal spray Place 2 sprays into both nostrils 3 (three) times daily.  . Melatonin 5 MG TABS Take 1 tablet by mouth at bedtime.   . midodrine (PROAMATINE) 5 MG tablet Take 1 tablet (5 mg total) by mouth 2 (two) times daily with a meal.  . Multiple Vitamins-Minerals (CENTRUM SILVER PO) Take 1 tablet by mouth daily.  . nitroGLYCERIN (NITROSTAT) 0.4 MG SL tablet Place 1 tablet (0.4 mg total) under the tongue every 5 (five) minutes as needed for chest pain. PT OVERDUE FOR OV PLEASE CALL FOR APPT  . pantoprazole (PROTONIX) 40 MG tablet Take 40 mg by mouth daily.   . potassium chloride SA (K-DUR,KLOR-CON) 20 MEQ tablet Take 40 mEq by mouth daily.  . Probiotic Product (ALIGN) 4 MG CAPS Take 1 capsule by mouth daily.   . sodium chloride (OCEAN) 0.65 % SOLN nasal spray Place 2 sprays into both nostrils daily as needed for congestion.   . torsemide (DEMADEX) 20 MG tablet Take 1 tablet (20 mg total) by mouth daily.  . vitamin B-12 (CYANOCOBALAMIN) 1000 MCG tablet Take 1,000 mcg by mouth daily.  . carbidopa-levodopa (SINEMET IR) 25-100 MG tablet Take 1 tablet by mouth 4 (four) times daily.   No facility-administered encounter medications on file as of 06/10/2019.    Review of Systems  Constitutional: Negative for activity change, appetite change, chills,  diaphoresis, fatigue, fever and unexpected weight change.  Respiratory: Negative for cough, shortness of breath, wheezing and stridor.   Cardiovascular: Negative for chest pain, palpitations and leg swelling.  Gastrointestinal: Negative for abdominal distention, abdominal pain, constipation and diarrhea.       Periodic loose stools  Genitourinary: Negative for difficulty urinating and dysuria.  Musculoskeletal: Negative for arthralgias, back pain, joint swelling and myalgias.  Skin: Positive for wound.  Neurological: Negative for dizziness, tremors, seizures, syncope, facial asymmetry, speech difficulty, weakness and headaches.       Slow movement, falls  Hematological: Negative for adenopathy. Does  not bruise/bleed easily.  Psychiatric/Behavioral: Negative for agitation, behavioral problems and confusion.       Memory loss    Immunization History  Administered Date(s) Administered  . Influenza, High Dose Seasonal PF 01/21/2019  . Influenza-Unspecified 12/06/2014, 11/27/2017  . Moderna SARS-COVID-2 Vaccination 04/13/2019, 05/11/2019  . Pneumococcal Conjugate-13 05/21/2018  . Pneumococcal Polysaccharide-23 05/21/2005  . Tdap 06/03/2006, 10/22/2018  . Zoster Recombinat (Shingrix) 10/22/2018, 12/24/2018   Pertinent  Health Maintenance Due  Topic Date Due  . URINE MICROALBUMIN  06/17/2019 (Originally 01/07/1939)  . INFLUENZA VACCINE  Completed  . PNA vac Low Risk Adult  Completed   Fall Risk  10/22/2018 05/12/2018 10/03/2017 12/29/2014  Falls in the past year? 1 1 No Yes  Number falls in past yr: 1 1 - 1  Injury with Fall? 1 0 - No  Risk for fall due to : History of fall(s);Impaired mobility Impaired mobility;History of fall(s);Impaired balance/gait;Mental status change;Medication side effect - Impaired balance/gait  Follow up Falls evaluation completed;Falls prevention discussed Falls evaluation completed;Falls prevention discussed;Education provided - Falls evaluation completed    Functional Status Survey:    Vitals:   06/10/19 0948  Weight: 181 lb (82.1 kg)   Body mass index is 26.73 kg/m. Physical Exam Vitals and nursing note reviewed.  Constitutional:      General: He is not in acute distress.    Appearance: He is not diaphoretic.  HENT:     Head: Normocephalic and atraumatic.  Neck:     Thyroid: No thyromegaly.     Vascular: No JVD.     Trachea: No tracheal deviation.  Cardiovascular:     Rate and Rhythm: Normal rate and regular rhythm.     Heart sounds: No murmur.  Pulmonary:     Effort: Pulmonary effort is normal. No respiratory distress.     Breath sounds: Normal breath sounds. No wheezing.  Abdominal:     General: Bowel sounds are normal. There is no distension.     Palpations: Abdomen is soft.     Tenderness: There is no abdominal tenderness.  Musculoskeletal:     Cervical back: Normal range of motion and neck supple.     Right lower leg: No edema.     Left lower leg: No edema.  Lymphadenopathy:     Cervical: No cervical adenopathy.  Skin:    General: Skin is warm and dry.     Comments: Right temple area with SCC 100 % pink tissue, no drainage or tenderness.   Neurological:     Mental Status: He is alert and oriented to person, place, and time.     Cranial Nerves: No cranial nerve deficit.     Labs reviewed: Recent Labs    08/24/18 0000  NA 139  K 4.1  BUN 17  CREATININE 1.1   Recent Labs    08/24/18 0000  AST 48*  ALT 23  ALKPHOS 88   No results for input(s): WBC, NEUTROABS, HGB, HCT, MCV, PLT in the last 8760 hours. Lab Results  Component Value Date   TSH 2.428 11/12/2014   No results found for: HGBA1C Lab Results  Component Value Date   CHOL 129 08/24/2018   HDL 55 08/24/2018   LDLCALC 44 08/24/2018   TRIG 153 08/24/2018    Significant Diagnostic Results in last 30 days:  No results found.  Assessment/Plan  1. Parkinson disease ([place]) Continue current titration as outlined by Neurology. Tolerating  well at this time.   2. Dementia due to Parkinson's  disease without behavioral disturbance (HCC) Mild memory loss with stable MMSE scores over the past year Continue supportive care and Aricept   3. Crohn's disease of small intestine without complication ([place]) No new issues. Continue Questran 4 grams qd   4. SCC (squamous cell carcinoma), face F/U for removal on 3/10  5. Orthostatic hypotension Controlled with midodrine 5 mg bid   6. Chronic diastolic CHF (congestive heart failure) (HCC) Compensated  Continue torsemide 20 mg qd    Family/ staff Communication: resident and nurse  Labs/tests ordered:  NA

## 2019-06-16 DIAGNOSIS — Z20828 Contact with and (suspected) exposure to other viral communicable diseases: Secondary | ICD-10-CM | POA: Diagnosis not present

## 2019-06-16 DIAGNOSIS — Z85820 Personal history of malignant melanoma of skin: Secondary | ICD-10-CM | POA: Diagnosis not present

## 2019-06-16 DIAGNOSIS — Z9189 Other specified personal risk factors, not elsewhere classified: Secondary | ICD-10-CM | POA: Diagnosis not present

## 2019-06-16 DIAGNOSIS — C44329 Squamous cell carcinoma of skin of other parts of face: Secondary | ICD-10-CM | POA: Diagnosis not present

## 2019-06-16 DIAGNOSIS — Z85828 Personal history of other malignant neoplasm of skin: Secondary | ICD-10-CM | POA: Diagnosis not present

## 2019-06-25 ENCOUNTER — Encounter: Payer: Self-pay | Admitting: Adult Health

## 2019-06-25 ENCOUNTER — Non-Acute Institutional Stay (SKILLED_NURSING_FACILITY): Payer: PPO | Admitting: Adult Health

## 2019-06-25 DIAGNOSIS — R635 Abnormal weight gain: Secondary | ICD-10-CM

## 2019-06-25 DIAGNOSIS — I5032 Chronic diastolic (congestive) heart failure: Secondary | ICD-10-CM

## 2019-06-25 DIAGNOSIS — E039 Hypothyroidism, unspecified: Secondary | ICD-10-CM | POA: Diagnosis not present

## 2019-06-25 DIAGNOSIS — R000 Tachycardia, unspecified: Secondary | ICD-10-CM | POA: Diagnosis not present

## 2019-06-25 DIAGNOSIS — D649 Anemia, unspecified: Secondary | ICD-10-CM | POA: Diagnosis not present

## 2019-06-25 DIAGNOSIS — I129 Hypertensive chronic kidney disease with stage 1 through stage 4 chronic kidney disease, or unspecified chronic kidney disease: Secondary | ICD-10-CM | POA: Diagnosis not present

## 2019-06-25 DIAGNOSIS — E1122 Type 2 diabetes mellitus with diabetic chronic kidney disease: Secondary | ICD-10-CM | POA: Diagnosis not present

## 2019-06-25 LAB — BASIC METABOLIC PANEL
BUN: 16 (ref 4–21)
CO2: 28 — AB (ref 13–22)
Chloride: 101 (ref 99–108)
Creatinine: 0.9 (ref 0.6–1.3)
Glucose: 88
Potassium: 3.7 mEq/L (ref 3.5–5.1)
Sodium: 138 (ref 137–147)

## 2019-06-25 LAB — COMPREHENSIVE METABOLIC PANEL: Calcium: 8.2 — AB (ref 8.7–10.7)

## 2019-06-25 NOTE — Progress Notes (Signed)
Location:  Occupational psychologist of Service:  SNF (31) Provider:   Cindi Carbon, ANP [place] [phone]   Gayland Curry, DO  Patient Care Team: Gayland Curry, DO as PCP - General (Geriatric Medicine) [name], [name], MD as Consulting Physician (Cardiology) [name], MD as Attending Physician (Gastroenterology) [name], MD as Consulting Physician (Neurology) Community, [facility] ([place]) Royal Hawthorn, NP as Nurse Practitioner (Nurse Practitioner)  Extended Emergency Contact Information Primary Emergency Contact: [name] Address: [address]          [place] Phone: [phone] Mobile Phone: [phone] Relation: Spouse  Code Status:  DNR Goals of care: Advanced Directive information Advanced Directives 04/27/2019  Does Patient Have a Medical Advance Directive? Yes  Type of Advance Directive [place]  Does patient want to make changes to medical advance directive? No - Patient declined  Copy of [place] in Chart? -  Pre-existing out of facility DNR order (yellow form or pink MOST form) -     Chief Complaint  Patient presents with  . Acute Visit    increased edema    HPI:  Pt is a 86 y.o. male seen today for an acute visit for increased edema and tachycardia. The nurse noted increased edema in his legs for the past 4 days. No sob, cp, doe, or pnd. He had surgery with tooth extraction on the right on 06/23/2019.  Received doxycycline for 5 days 3/10-3/15 for this issue. Denies any mouth pain or fever. Also had mohs surgery to temple lesion on 06/16/19.  He has gained 8 lbs since Jan, weighed 184 lbs this am.  Pulse this morning was 112 with no palpitations or feelings of skipped beats. Normally runs 80-90 range.    Past Medical History:  Diagnosis Date  . Cellulitis   . Chronic diastolic CHF  (congestive heart failure) (HCC)    a. EF initially 35-40% after MI 1/09; b. echo 7/08: EF 60%;  c. 11/2014 Echo: EF 65-70%, Gr 1 DD, mild MR.  . Coronary artery disease    a. s/p anterior STEMI 04/2007 with BMS-> LAD;  b. Cath 12/2011 patent stent;  c. low risk nuc 04/2014.  . Crohn's disease ([place])   . Diaphragmatic hernia without mention of obstruction or gangrene   . Diverticulosis of colon (without mention of hemorrhage)   . Fatty liver    a. on ultrasound of 10/2009  . Flatulence, eructation, and gas pain   . Fungal infection   . Gait disorder   . GERD (gastroesophageal reflux disease)   . HOH (hard of hearing)    Hearing aids  . Hyperlipidemia   . Hypertension   . Ischemic cardiomyopathy    a. EF initially 35-40% after MI 1/09; b. echo 7/08: EF 60%;  c. 11/2014 Echo: EF 65-70%, Gr 1 DD, mild MR.  . Melanoma ([place])   . Memory disorder   . Orthostatic hypotension   . Osteoporosis   . Other chronic nonalcoholic liver disease   . Other esophagitis   . Parkinson's disease ([place])   . RBBB 09/02/2013  . Regional enteritis of small intestine ([place])   . Renal calculi   . Restless leg syndrome 03/22/2015  . Ulcerative (chronic) ileocolitis ([place])   . Urinary incontinence    Past Surgical History:  Procedure Laterality Date  . APPENDECTOMY    . BACK SURGERY  L3, L4, L5  . CARDIAC CATHETERIZATION  09/25/06   EF 35-40% but more recently 60%  . CATARACT EXTRACTION     Bilateral  . CHOLECYSTECTOMY    . Coronary artery stent placement    . Melanoma resection    . Partial bowel resection    . TONSILLECTOMY      No Known Allergies  Outpatient Encounter Medications as of 06/25/2019  Medication Sig  . traMADol (ULTRAM) 50 MG tablet Take by mouth every 6 (six) hours as needed.  Marland Kitchen acetaminophen (TYLENOL) 325 MG tablet Take 650 mg by mouth at bedtime. And q 6 hrs prn  . aspirin EC 81 MG tablet Take 81 mg by mouth daily.  Marland Kitchen atorvastatin (LIPITOR) 40 MG tablet Take 40 mg by mouth daily.    . calcium carbonate (CALCIUM 600) 600 MG TABS tablet Take 600 mg by mouth daily with breakfast.  . carbidopa-levodopa (SINEMET CR) 50-200 MG tablet Take 1 tablet by mouth 4 (four) times daily.  . carbidopa-levodopa (SINEMET IR) 25-100 MG tablet Take 1 tablet by mouth 4 (four) times daily.  . chlorhexidine (PERIDEX) 0.12 % solution Use as directed 15 mLs in the mouth or throat 2 (two) times daily.  . Cholecalciferol (VITAMIN D3) 50 MCG (2000 UT) TABS Take 1 tablet by mouth daily.  . cholestyramine (QUESTRAN) 4 g packet TAKE 1 PACKET BY MOUTH TWICE DAILY WITH A MEAL  . donepezil (ARICEPT) 10 MG tablet TAKE ONE TABLET AT BEDTIME  . ipratropium (ATROVENT) 0.03 % nasal spray Place 2 sprays into both nostrils 3 (three) times daily.  . Melatonin 5 MG TABS Take 1 tablet by mouth at bedtime.   . midodrine (PROAMATINE) 5 MG tablet Take 1 tablet (5 mg total) by mouth 2 (two) times daily with a meal.  . Multiple Vitamins-Minerals (CENTRUM SILVER PO) Take 1 tablet by mouth daily.  . nitroGLYCERIN (NITROSTAT) 0.4 MG SL tablet Place 1 tablet (0.4 mg total) under the tongue every 5 (five) minutes as needed for chest pain. PT OVERDUE FOR OV PLEASE CALL FOR APPT  . pantoprazole (PROTONIX) 40 MG tablet Take 40 mg by mouth daily.   . potassium chloride SA (K-DUR,KLOR-CON) 20 MEQ tablet Take 40 mEq by mouth daily.  . Probiotic Product (ALIGN) 4 MG CAPS Take 1 capsule by mouth daily.   . sodium chloride (OCEAN) 0.65 % SOLN nasal spray Place 2 sprays into both nostrils daily as needed for congestion.   . torsemide (DEMADEX) 20 MG tablet Take 1 tablet (20 mg total) by mouth daily.  . vitamin B-12 (CYANOCOBALAMIN) 1000 MCG tablet Take 1,000 mcg by mouth daily.   No facility-administered encounter medications on file as of 06/25/2019.    Review of Systems  Constitutional: Negative for activity change, appetite change, chills, diaphoresis, fatigue, fever and unexpected weight change.  HENT:       Had tooth  extraction. Mohs skin lesion surgery right temple  Respiratory: Negative for cough, shortness of breath, wheezing and stridor.   Cardiovascular: Positive for leg swelling. Negative for chest pain and palpitations.  Gastrointestinal: Negative for abdominal distention, abdominal pain, constipation and diarrhea.  Genitourinary: Negative for difficulty urinating and dysuria.  Musculoskeletal: Positive for gait problem. Negative for arthralgias, back pain, joint swelling and myalgias.  Neurological: Negative for dizziness, seizures, syncope, facial asymmetry, speech difficulty, weakness and headaches.  Hematological: Negative for adenopathy. Does not bruise/bleed easily.  Psychiatric/Behavioral: Negative for agitation, behavioral problems and confusion.    Immunization History  Administered Date(s)  Administered  . Influenza, High Dose Seasonal PF 01/21/2019  . Influenza-Unspecified 12/06/2014, 11/27/2017  . Moderna SARS-COVID-2 Vaccination 04/13/2019, 05/11/2019  . Pneumococcal Conjugate-13 05/21/2018  . Pneumococcal Polysaccharide-23 05/21/2005  . Tdap 06/03/2006, 10/22/2018  . Zoster Recombinat (Shingrix) 10/22/2018, 12/24/2018   Pertinent  Health Maintenance Due  Topic Date Due  . URINE MICROALBUMIN  Never done  . INFLUENZA VACCINE  Completed  . PNA vac Low Risk Adult  Completed   Fall Risk  10/22/2018 05/12/2018 10/03/2017 12/29/2014  Falls in the past year? 1 1 No Yes  Number falls in past yr: 1 1 - 1  Injury with Fall? 1 0 - No  Risk for fall due to : History of fall(s);Impaired mobility Impaired mobility;History of fall(s);Impaired balance/gait;Mental status change;Medication side effect - Impaired balance/gait  Follow up Falls evaluation completed;Falls prevention discussed Falls evaluation completed;Falls prevention discussed;Education provided - Falls evaluation completed   Functional Status Survey:    Vitals:   06/25/19 1040  BP: (!) 148/82  Pulse: (!) 115  Resp: 14  Temp:  (!) 97.5 F (36.4 C)  SpO2: 94%  Weight: 184 lb (83.5 kg)   Body mass index is 27.17 kg/m. Physical Exam Vitals and nursing note reviewed.  Constitutional:      General: He is not in acute distress.    Appearance: He is not diaphoretic.  HENT:     Head: Normocephalic and atraumatic.     Mouth/Throat:     Mouth: Mucous membranes are moist.     Pharynx: Oropharynx is clear.  Neck:     Thyroid: No thyromegaly.     Vascular: No JVD.     Trachea: No tracheal deviation.  Cardiovascular:     Rate and Rhythm: Regular rhythm. Tachycardia present.     Heart sounds: No murmur.  Pulmonary:     Effort: Pulmonary effort is normal. No respiratory distress.     Breath sounds: Normal breath sounds. No wheezing.  Abdominal:     General: Bowel sounds are normal. There is no distension.     Palpations: Abdomen is soft.     Tenderness: There is no abdominal tenderness.  Musculoskeletal:     Right lower leg: Edema (+2) present.     Left lower leg: Edema (+1) present.     Comments: Edema to BLE is pitting but not tender to palpation and no warmth.   Lymphadenopathy:     Cervical: No cervical adenopathy.  Skin:    General: Skin is warm and dry.  Neurological:     General: No focal deficit present.     Mental Status: He is alert and oriented to person, place, and time. Mental status is at baseline.  Psychiatric:        Mood and Affect: Mood normal.     Labs reviewed: Recent Labs    08/24/18 0000  NA 139  K 4.1  BUN 17  CREATININE 1.1   Recent Labs    08/24/18 0000  AST 48*  ALT 23  ALKPHOS 88   No results for input(s): WBC, NEUTROABS, HGB, HCT, MCV, PLT in the last 8760 hours. Lab Results  Component Value Date   TSH 2.428 11/12/2014   No results found for: HGBA1C Lab Results  Component Value Date   CHOL 129 08/24/2018   HDL 55 08/24/2018   LDLCALC 44 08/24/2018   TRIG 153 08/24/2018    Significant Diagnostic Results in last 30 days:  No results  found.  Assessment/Plan  1. Weight gain  Give one additional dose of torsemide 40 mg x 1   2. Tachycardia Mild elevation with regular rhythm auscultated and no symptoms. Will check labs to eval renal function and CBC and thyroid   3. Chronic diastolic CHF (congestive heart failure) (HCC) Possible mild exacerbation  Recommend compression hose, elevation to legs when possible, low sodium diet, and daily weights x 1 month See #1 and monitor for improvement    Family/ staff Communication: nurse to communicate with his wife  Labs/tests ordered:  CBC BMP TSH

## 2019-07-10 NOTE — Progress Notes (Signed)
Cardiology Office Note Date:  07/13/2019  Patient ID:  [name] [name], DOB [birth year], MRN [id number] PCP:  Gayland Curry, DO  Cardiologist:  Dr. [name]   Chief Complaint: CAD  History of Present Illness: [name] [name] is a 86 y.o. male with history of CAD (s/p anterior STEMI 04/2007 with BMS-> LAD, patent by cath in 12/2011, low risk nuc 04/2014), chronic dCHF, HTN, HLD, Crohn's disease, GERD, Parkinson's disease, cellulitis, orthostatic hypotension requiring midodrine, GERD, RBBB, fatty liver, gait disorder, chronic lower extremity edema who presents for follow up.   [name] [name] was seen in the ED in May 2019 with left leg cellulitis. In September 2019 [name] [name] was admitted with right leg cellulitis having failed outpatient antibiotics. Dopplers negative for DVT.   [name] [name] is followed by Dr Jannifer Franklin for [name] [name] Parkinson's disease and memory loss.   [name] [name] is seen with [name] [name] today. [name] [name] is  in skilled nursing at [name]. [name] [name] is doing really [name]. [name] [name] is very active in things [name] [name] can do in [name] [name] wheelchair.  Denies any chest pain, dyspnea,  fever.  Weight is stable at [name] [name]. [name] [name] does note some LE edema. Wears support hose. Does eat a fair amount of salt.    Past Medical History:  Diagnosis Date  . Cellulitis   . Chronic diastolic CHF (congestive heart failure) (HCC)    a. EF initially 35-40% after MI 1/09; b. echo 7/08: EF 60%;  c. 11/2014 Echo: EF 65-70%, Gr 1 DD, mild MR.  . Coronary artery disease    a. s/p anterior STEMI 04/2007 with BMS-> LAD;  b. Cath 12/2011 patent stent;  c. low risk nuc 04/2014.  . Crohn's disease ([place])   . Diaphragmatic hernia without mention of obstruction or gangrene   . Diverticulosis of colon (without mention of hemorrhage)   . Fatty liver    a. on ultrasound of 10/2009  . Flatulence, eructation, and gas pain   . Fungal infection   . Gait disorder   . GERD (gastroesophageal reflux disease)   . HOH (hard of hearing)    Hearing aids  . Hyperlipidemia   . Hypertension   .  Ischemic cardiomyopathy    a. EF initially 35-40% after MI 1/09; b. echo 7/08: EF 60%;  c. 11/2014 Echo: EF 65-70%, Gr 1 DD, mild MR.  . Melanoma ([place])   . Memory disorder   . Orthostatic hypotension   . Osteoporosis   . Other chronic nonalcoholic liver disease   . Other esophagitis   . Parkinson's disease ([place])   . RBBB 09/02/2013  . Regional enteritis of small intestine ([place])   . Renal calculi   . Restless leg syndrome 03/22/2015  . 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 Urinary incontinence     Past Surgical History:  Procedure Laterality Date  . APPENDECTOMY    . BACK SURGERY     L3, L4, L5  . CARDIAC CATHETERIZATION  09/25/06   EF 35-40% but more recently 60%  . CATARACT EXTRACTION     Bilateral  . CHOLECYSTECTOMY    . Coronary artery stent placement    . Melanoma resection    . Partial bowel resection    . TONSILLECTOMY      Current Outpatient Medications  Medication Sig Dispense Refill  . acetaminophen (TYLENOL) 325 MG tablet Take 650 mg by mouth at bedtime. And q 6 hrs prn    . aspirin EC 81 MG tablet Take 81 mg by mouth daily.    Marland Kitchen  atorvastatin (LIPITOR) 40 MG tablet Take 40 mg by mouth daily.    . calcium carbonate (CALCIUM 600) 600 MG TABS tablet Take 600 mg by mouth daily with breakfast.    . carbidopa-levodopa (SINEMET CR) 50-200 MG tablet Take 1 tablet by mouth 4 (four) times daily. 120 tablet 5  . carbidopa-levodopa (SINEMET IR) 25-100 MG tablet Take 1 tablet by mouth 4 (four) times daily.    . chlorhexidine (PERIDEX) 0.12 % solution Use as directed 15 mLs in the mouth or throat 2 (two) times daily.    . Cholecalciferol (VITAMIN D3) 50 MCG (2000 UT) TABS Take 1 tablet by mouth daily.    . cholestyramine (QUESTRAN) 4 g packet TAKE 1 PACKET BY MOUTH TWICE DAILY WITH A MEAL 60 each 11  . donepezil (ARICEPT) 10 MG tablet TAKE ONE TABLET AT BEDTIME 90 tablet 0  . ipratropium (ATROVENT) 0.03 % nasal spray Place 2 sprays into both nostrils 3 (three) times daily.     . Melatonin 5 MG TABS Take 1 tablet by mouth at bedtime.     . midodrine (PROAMATINE) 5 MG tablet Take 1 tablet (5 mg total) by mouth 2 (two) times daily with a meal. 60 tablet 9  . nitroGLYCERIN (NITROSTAT) 0.4 MG SL tablet Place 1 tablet (0.4 mg total) under the tongue every 5 (five) minutes as needed for chest pain. PT OVERDUE FOR OV PLEASE CALL FOR APPT 25 tablet 0  . pantoprazole (PROTONIX) 40 MG tablet Take 40 mg by mouth daily.     . potassium chloride SA (K-DUR,KLOR-CON) 20 MEQ tablet Take 40 mEq by mouth daily.    . Probiotic Product (ALIGN) 4 MG CAPS Take 1 capsule by mouth daily.     . sodium chloride (OCEAN) 0.65 % SOLN nasal spray Place 2 sprays into both nostrils daily as needed for congestion.     . torsemide (DEMADEX) 20 MG tablet Take 1 tablet (20 mg total) by mouth daily. 180 tablet 3  . vitamin B-12 (CYANOCOBALAMIN) 1000 MCG tablet Take 1,000 mcg by mouth daily.     No current facility-administered medications for this visit.    Allergies:   Patient has no known allergies.   Social History:  The patient  reports that [name] [name] has never smoked. [name] [name] has never used smokeless tobacco. [name] [name] reports current alcohol use of about 2.0 - 3.0 standard drinks of alcohol per week. [name] [name] reports that [name] [name] does not use drugs.   Family History:  The patient's family history includes Colon cancer in [name] [name] brother; Coronary artery disease in an other family member; Emphysema in [name] [name] father; Heart attack in [name] [name] mother; Heart disease in [name] [name] brother and brother; Heart failure in [name] [name] mother; Hypertension in [name] [name] mother.  ROS:  Please see the history of present illness.   All other systems are reviewed and otherwise negative.   PHYSICAL EXAM:  VS:  BP 110/65   Pulse (!) 102   Ht 5' 9"  (1.753 m)   Wt 186 lb (84.4 kg)   SpO2 97%   BMI 27.47 kg/m  BMI: Body mass index is 27.47 kg/m. GENERAL:  [name] appearing, elderly WM in NAD, seen in a wheelchair.  HEENT:  PERRL, EOMI, sclera are clear. Oropharynx is  clear. NECK:  No jugular venous distention, carotid upstroke brisk and symmetric, no bruits, no thyromegaly or adenopathy LUNGS:  Clear to auscultation bilaterally CHEST:  Unremarkable HEART:  RRR,  PMI not displaced or sustained,S1 and S2 within normal limits, no S3, no  S4: no clicks, no rubs, no murmurs ABD:  Soft, nontender. Distended. BS +, no masses or bruits. No hepatomegaly, no splenomegaly EXT:  2 + pulses throughout, 1-2+ RLE  Edema, trace on right., no cyanosis no clubbing SKIN:  Warm and dry.  No rashes NEURO:  Alert and oriented x 3. Cranial nerves II through XII intact. PSYCH:  Cognitively intact  EKG:  Is not done today.     Recent Labs: 08/24/2018: ALT 23; BUN 17; Creatinine 1.1; Potassium 4.1; Sodium 139  08/24/2018: Cholesterol 129; HDL 55; LDL Cholesterol 44; Triglycerides 153   CrCl cannot be calculated (Patient's most recent lab result is older than the maximum 21 days allowed.).    Wt Readings from Last 3 Encounters:  07/13/19 186 lb (84.4 kg)  07/13/19 184 lb 3.2 oz (83.6 kg)  06/25/19 184 lb (83.5 kg)     Other studies reviewed: Additional studies/records reviewed today include:  Echo 11/14/15: Study Conclusions  - Left ventricle: The cavity size was normal. There was mild focal   basal and mild concentric hypertrophy of the septum. Systolic   function was vigorous. The estimated ejection fraction was in the   range of 65% to 70%. Wall motion was normal; there were no   regional wall motion abnormalities. Doppler parameters are   consistent with abnormal left ventricular relaxation (grade 1   diastolic dysfunction). - Mitral valve: There was mild regurgitation.  Impressions:  - Compared to the prior study, there has been no significant   interval change.  ASSESSMENT AND PLAN:  1. Chronic diastolic CHF -[name] compensated. [name] [name] does have mild edema R>L, weight is stable. Recommend continuing torsemide 20 mg daily. Restrict salt intake more. Continue  compression hose. Keep feet elevated when possible. If swelling worsens may need to increase diuretics for a few days. Will update labs including CBC, CMET, TSH and lipids.  2. History of essential HTN with orthostatic hypotension. BP [name] controlled on current regimen and [name] [name] has no orthostatic symptoms.  3. CAD s/p anterior MI 2009 with BMS to LAD. Cath 2013 showed patent stent. Normal Myoview Jan 2016.  Asymptomatic. 4.   Parkinson's disease   Disposition: F/u in 6 months  Current medicines are reviewed at length with the patient today.  The patient did not have any concerns regarding medicines.  Signed, Bhavesh Vazquez [name] MD, [facility]    07/13/2019 4:40 PM     [facility]

## 2019-07-13 ENCOUNTER — Other Ambulatory Visit: Payer: Self-pay

## 2019-07-13 ENCOUNTER — Ambulatory Visit: Payer: PPO | Admitting: Cardiology

## 2019-07-13 ENCOUNTER — Encounter: Payer: Self-pay | Admitting: Internal Medicine

## 2019-07-13 ENCOUNTER — Non-Acute Institutional Stay (SKILLED_NURSING_FACILITY): Payer: PPO | Admitting: Internal Medicine

## 2019-07-13 ENCOUNTER — Encounter: Payer: Self-pay | Admitting: Cardiology

## 2019-07-13 VITALS — BP 110/65 | HR 102 | Ht 69.0 in | Wt 186.0 lb

## 2019-07-13 DIAGNOSIS — G20 Parkinson's disease: Secondary | ICD-10-CM

## 2019-07-13 DIAGNOSIS — E782 Mixed hyperlipidemia: Secondary | ICD-10-CM

## 2019-07-13 DIAGNOSIS — F0280 Dementia in other diseases classified elsewhere without behavioral disturbance: Secondary | ICD-10-CM | POA: Diagnosis not present

## 2019-07-13 DIAGNOSIS — K7460 Unspecified cirrhosis of liver: Secondary | ICD-10-CM | POA: Diagnosis not present

## 2019-07-13 DIAGNOSIS — I10 Essential (primary) hypertension: Secondary | ICD-10-CM | POA: Diagnosis not present

## 2019-07-13 DIAGNOSIS — I25118 Atherosclerotic heart disease of native coronary artery with other forms of angina pectoris: Secondary | ICD-10-CM

## 2019-07-13 DIAGNOSIS — G20A1 Parkinson's disease without dyskinesia, without mention of fluctuations: Secondary | ICD-10-CM

## 2019-07-13 DIAGNOSIS — I2510 Atherosclerotic heart disease of native coronary artery without angina pectoris: Secondary | ICD-10-CM | POA: Diagnosis not present

## 2019-07-13 DIAGNOSIS — N3945 Continuous leakage: Secondary | ICD-10-CM

## 2019-07-13 DIAGNOSIS — K5000 Crohn's disease of small intestine without complications: Secondary | ICD-10-CM

## 2019-07-13 DIAGNOSIS — I5032 Chronic diastolic (congestive) heart failure: Secondary | ICD-10-CM | POA: Diagnosis not present

## 2019-07-13 DIAGNOSIS — I255 Ischemic cardiomyopathy: Secondary | ICD-10-CM | POA: Diagnosis not present

## 2019-07-13 NOTE — Progress Notes (Signed)
[name], male   DOB: [birth year], 86 y.o.   MRN: [id number]  Location:  [place] Room Number: 196 Place of Service:  SNF (31) Provider:  Dezmond Downie L. Mariea Clonts, D.O., C.M.D.  Gayland Curry, DO  Patient Care Team: Gayland Curry, DO as PCP - General (Geriatric Medicine) [name], [name], MD as Consulting Physician (Cardiology) [name], MD as Attending Physician (Gastroenterology) [name], MD as Consulting Physician (Neurology) Community, [facility] ([place]) Royal Hawthorn, NP as Nurse Practitioner (Nurse Practitioner)  Extended Emergency Contact Information Primary Emergency Contact: [name] Address: [address]          [place] Phone: [phone] Mobile Phone: [phone] Relation: Spouse  Code Status:  DNR, does not have MOST Goals of care: Advanced Directive information Advanced Directives 07/13/2019  Does Patient Have a Medical Advance Directive? Yes  Type of Advance Directive Out of facility DNR (pink MOST or yellow form)  Does patient want to make changes to medical advance directive? No - Patient declined  Copy of [place] in Chart? -  Pre-existing out of facility DNR order (yellow form or pink MOST form) Pink MOST/Yellow Form most recent copy in chart - Physician notified to receive inpatient order     Chief Complaint  Patient presents with  . Medical Management of Chronic Issues    Routine Visit     HPI:  Pt is a 86 y.o. male seen today for medical management of chronic diseases.  [name] [name] is doing very well.  He has no complaints or problems to report which has been the case for our past several visits.  He also has not complained to nursing.    He also had a cardiology appt today at 3pm with Dr. [name] and his wife accompanied him to that visit.  He was doing well with just some LE edema, but he is  not restricting his sodium intake.  He uses his power chair to get around.  He had no chest pain, sob.  He has not had changes in terms of his parkinson's which has been followed by Dr. Jannifer Franklin.  He has some dementia related to this, but remains involved in his daily ADLs and is always well dressed and groomed.    He's had no recent flares of his Crohn's disease reported.       Past Medical History:  Diagnosis Date  . Cellulitis   . Chronic diastolic CHF (congestive heart failure) (HCC)    a. EF initially 35-40% after MI 1/09; b. echo 7/08: EF 60%;  c. 11/2014 Echo: EF 65-70%, Gr 1 DD, mild MR.  . Coronary artery disease    a. s/p anterior STEMI 04/2007 with BMS-> LAD;  b. Cath 12/2011 patent stent;  c. low risk nuc 04/2014.  . Crohn's disease ([place])   . Diaphragmatic hernia without mention of obstruction or gangrene   . Diverticulosis of colon (without mention of hemorrhage)   . Fatty liver    a. on ultrasound of 10/2009  . Flatulence, eructation, and gas pain   . Fungal infection   . Gait disorder   . GERD (gastroesophageal reflux disease)   . HOH (hard of hearing)    Hearing aids  . Hyperlipidemia   . Hypertension   . Ischemic cardiomyopathy    a. EF initially 35-40% after MI 1/09; b. echo 7/08: EF 60%;  c. 11/2014  Echo: EF 65-70%, Gr 1 DD, mild MR.  . Melanoma ([place])   . Memory disorder   . Orthostatic hypotension   . Osteoporosis   . Other chronic nonalcoholic liver disease   . Other esophagitis   . Parkinson's disease ([place])   . RBBB 09/02/2013  . Regional enteritis of small intestine ([place])   . Renal calculi   . Restless leg syndrome 03/22/2015  . 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 Urinary incontinence    Past Surgical History:  Procedure Laterality Date  . APPENDECTOMY    . BACK SURGERY     L3, L4, L5  . CARDIAC CATHETERIZATION  09/25/06   EF 35-40% but more recently 60%  . CATARACT EXTRACTION     Bilateral  . CHOLECYSTECTOMY    . Coronary artery stent  placement    . Melanoma resection    . Partial bowel resection    . TONSILLECTOMY      No Known Allergies  Outpatient Encounter Medications as of 07/13/2019  Medication Sig  . acetaminophen (TYLENOL) 325 MG tablet Take 650 mg by mouth at bedtime. And q 6 hrs prn  . aspirin EC 81 MG tablet Take 81 mg by mouth daily.  Marland Kitchen atorvastatin (LIPITOR) 40 MG tablet Take 40 mg by mouth daily.  . calcium carbonate (CALCIUM 600) 600 MG TABS tablet Take 600 mg by mouth daily with breakfast.  . carbidopa-levodopa (SINEMET CR) 50-200 MG tablet Take 1 tablet by mouth 4 (four) times daily.  . carbidopa-levodopa (SINEMET IR) 25-100 MG tablet Take 1 tablet by mouth 4 (four) times daily.  . chlorhexidine (PERIDEX) 0.12 % solution Use as directed 15 mLs in the mouth or throat 2 (two) times daily.  . Cholecalciferol (VITAMIN D3) 50 MCG (2000 UT) TABS Take 1 tablet by mouth daily.  . cholestyramine (QUESTRAN) 4 g packet TAKE 1 PACKET BY MOUTH TWICE DAILY WITH A MEAL  . donepezil (ARICEPT) 10 MG tablet TAKE ONE TABLET AT BEDTIME  . ipratropium (ATROVENT) 0.03 % nasal spray Place 2 sprays into both nostrils 3 (three) times daily.  . Melatonin 5 MG TABS Take 1 tablet by mouth at bedtime.   . midodrine (PROAMATINE) 5 MG tablet Take 1 tablet (5 mg total) by mouth 2 (two) times daily with a meal.  . nitroGLYCERIN (NITROSTAT) 0.4 MG SL tablet Place 1 tablet (0.4 mg total) under the tongue every 5 (five) minutes as needed for chest pain. PT OVERDUE FOR OV PLEASE CALL FOR APPT  . pantoprazole (PROTONIX) 40 MG tablet Take 40 mg by mouth daily.   . potassium chloride SA (K-DUR,KLOR-CON) 20 MEQ tablet Take 40 mEq by mouth daily.  . Probiotic Product (ALIGN) 4 MG CAPS Take 1 capsule by mouth daily.   . sodium chloride (OCEAN) 0.65 % SOLN nasal spray Place 2 sprays into both nostrils daily as needed for congestion.   . torsemide (DEMADEX) 20 MG tablet Take 1 tablet (20 mg total) by mouth daily.  . vitamin B-12 (CYANOCOBALAMIN)  1000 MCG tablet Take 1,000 mcg by mouth daily.  . [DISCONTINUED] Multiple Vitamins-Minerals (CENTRUM SILVER PO) Take 1 tablet by mouth daily.  . [DISCONTINUED] traMADol (ULTRAM) 50 MG tablet Take by mouth every 6 (six) hours as needed.   No facility-administered encounter medications on file as of 07/13/2019.    Review of Systems  Constitutional: Negative for chills, fever and malaise/fatigue.  HENT: Negative for congestion and sore throat.   Eyes: Negative for blurred vision.  Respiratory: Negative  for cough and shortness of breath.   Cardiovascular: Positive for leg swelling. Negative for chest pain, palpitations, orthopnea and PND.  Gastrointestinal: Negative for abdominal pain, blood in stool, constipation, diarrhea and melena.  Genitourinary: Negative for dysuria.       Urinary leakage  Musculoskeletal: Negative for falls and joint pain.  Skin: Negative for itching and rash.  Neurological: Positive for tremors. Negative for dizziness and loss of consciousness.  Endo/Heme/Allergies: Bruises/bleeds easily.  Psychiatric/Behavioral: Positive for memory loss. Negative for depression. The patient is not nervous/anxious and does not have insomnia.     Immunization History  Administered Date(s) Administered  . Influenza, High Dose Seasonal PF 01/21/2019  . Influenza-Unspecified 12/06/2014, 11/27/2017  . Moderna SARS-COVID-2 Vaccination 04/13/2019, 05/11/2019  . Pneumococcal Conjugate-13 05/21/2018  . Pneumococcal Polysaccharide-23 05/21/2005  . Tdap 06/03/2006, 10/22/2018  . Zoster Recombinat (Shingrix) 10/22/2018, 12/24/2018   Pertinent  Health Maintenance Due  Topic Date Due  . URINE MICROALBUMIN  Never done  . INFLUENZA VACCINE  11/07/2019  . PNA vac Low Risk Adult  Completed   Fall Risk  10/22/2018 05/12/2018 10/03/2017 12/29/2014  Falls in the past year? 1 1 No Yes  Number falls in past yr: 1 1 - 1  Injury with Fall? 1 0 - No  Risk for fall due to : History of fall(s);Impaired  mobility Impaired mobility;History of fall(s);Impaired balance/gait;Mental status change;Medication side effect - Impaired balance/gait  Follow up Falls evaluation completed;Falls prevention discussed Falls evaluation completed;Falls prevention discussed;Education provided - Falls evaluation completed   Functional Status Survey:    Vitals:   07/13/19 0957  BP: (!) 150/85  Pulse: 87  Temp: (!) 97.3 F (36.3 C)  SpO2: 94%  Weight: 184 lb 3.2 oz (83.6 kg)  Height: 5' 9"  (1.753 m)   Body mass index is 27.2 kg/m. Physical Exam Constitutional:      General: He is not in acute distress.    Appearance: Normal appearance. He is not ill-appearing or toxic-appearing.     Comments: Well dressed and groomed, was in manual wheelchair to go to his cardiology appt when seen  Cardiovascular:     Rate and Rhythm: Normal rate and regular rhythm.     Pulses: Normal pulses.     Heart sounds: Normal heart sounds.  Pulmonary:     Effort: Pulmonary effort is normal.     Breath sounds: Normal breath sounds. No rales.  Abdominal:     General: Bowel sounds are normal. There is no distension.     Palpations: Abdomen is soft.     Tenderness: There is no abdominal tenderness.  Musculoskeletal:     Right lower leg: Edema present.     Left lower leg: Edema present.     Comments: At baseline  Skin:    General: Skin is warm and dry.  Neurological:     General: No focal deficit present.     Mental Status: He is alert. Mental status is at baseline.     Gait: Gait abnormal.     Comments: Uses his power chair typically; slight resting tremor and rigidity  Psychiatric:        Mood and Affect: Mood normal.     Labs reviewed: Recent Labs    08/24/18 0000  NA 139  K 4.1  BUN 17  CREATININE 1.1   Recent Labs    08/24/18 0000  AST 48*  ALT 23  ALKPHOS 88   No results for input(s): WBC, NEUTROABS, HGB, HCT, MCV, PLT  in the last 8760 hours. Lab Results  Component Value Date   TSH 2.428  11/12/2014   No results found for: HGBA1C Lab Results  Component Value Date   CHOL 129 08/24/2018   HDL 55 08/24/2018   LDLCALC 44 08/24/2018   TRIG 153 08/24/2018    Assessment/Plan 1. Parkinson disease ([place]) -is fairly stable, cont sinemet 50/200 qid along with 25/100 qid  2. Dementia due to Parkinson's disease without behavioral disturbance ([place]) -is on aricept for this, tolerating fine  3. Mixed hyperlipidemia -last LDL may of '20 was at goal of less than 70 with lipitor 6m--on due to CAD and ischemic cardiomyopathy  4. Continuous leakage of urine -ongoing issue, cont depends, not on meds for bladder  5. Chronic diastolic CHF (congestive heart failure) (HCC) -stable per cardio notes, cont same regimen, try to avoid high sodium foods and adding salt  6. Crohn's disease of small intestine without complication ([place] -no recent flares reported -cont questran  7. Non-alcoholic cirrhosis ([place] -no related problems recently, but does have this condition underlying, monitor  8. Atherosclerosis of native coronary artery of native heart with other form of angina pectoris ([place] -stable on statin, asa 865m prn ntg (not needed), not on beta blocker or ace/arb due to orthostatic hypotension difficulties from his PD  Family/ staff Communication: discussed with snf nurse  Labs/tests ordered:  No new--appears cardiology ordered labs 4/6 but results are not shown as I complete this  Needs MOST completed at next care plan or annual exam.   Dearius Hoffmann L. Yassmine Tamm, D.O. [facility] [place] [address] [address] Phone (Mon-Fri 8am-5pm):  [phone] Call:  [phone] follow prompts after 5pm & weekends Office Phone:  [phone] Fax:  [phone]

## 2019-07-15 ENCOUNTER — Encounter: Payer: Self-pay | Admitting: Cardiology

## 2019-07-15 DIAGNOSIS — I10 Essential (primary) hypertension: Secondary | ICD-10-CM | POA: Diagnosis not present

## 2019-07-15 DIAGNOSIS — E039 Hypothyroidism, unspecified: Secondary | ICD-10-CM | POA: Diagnosis not present

## 2019-07-15 DIAGNOSIS — I5030 Unspecified diastolic (congestive) heart failure: Secondary | ICD-10-CM | POA: Diagnosis not present

## 2019-07-15 DIAGNOSIS — D649 Anemia, unspecified: Secondary | ICD-10-CM | POA: Diagnosis not present

## 2019-07-15 DIAGNOSIS — R609 Edema, unspecified: Secondary | ICD-10-CM | POA: Diagnosis not present

## 2019-07-15 DIAGNOSIS — E785 Hyperlipidemia, unspecified: Secondary | ICD-10-CM | POA: Diagnosis not present

## 2019-07-15 LAB — BASIC METABOLIC PANEL
BUN: 16 (ref 4–21)
CO2: 24 — AB (ref 13–22)
Chloride: 97 — AB (ref 99–108)
Creatinine: 0.9 (ref 0.6–1.3)
Glucose: 110
Potassium: 3.7 (ref 3.4–5.3)
Sodium: 138 (ref 137–147)

## 2019-07-15 LAB — COMPREHENSIVE METABOLIC PANEL
Albumin: 4.7 (ref 3.5–5.0)
Calcium: 8.9 (ref 8.7–10.7)
Globulin: 3.1

## 2019-07-15 LAB — LIPID PANEL
Cholesterol: 131 (ref 0–200)
HDL: 63 (ref 35–70)
LDL Cholesterol: 44
LDl/HDL Ratio: 2.1
Triglycerides: 120 (ref 40–160)

## 2019-07-15 LAB — TSH: TSH: 5.09 (ref <=5.90)

## 2019-07-22 ENCOUNTER — Encounter: Payer: Self-pay | Admitting: Internal Medicine

## 2019-08-12 ENCOUNTER — Non-Acute Institutional Stay (SKILLED_NURSING_FACILITY): Payer: PPO | Admitting: Adult Health

## 2019-08-12 DIAGNOSIS — I5032 Chronic diastolic (congestive) heart failure: Secondary | ICD-10-CM

## 2019-08-12 DIAGNOSIS — N3945 Continuous leakage: Secondary | ICD-10-CM | POA: Diagnosis not present

## 2019-08-12 DIAGNOSIS — K219 Gastro-esophageal reflux disease without esophagitis: Secondary | ICD-10-CM

## 2019-08-12 DIAGNOSIS — I951 Orthostatic hypotension: Secondary | ICD-10-CM

## 2019-08-12 DIAGNOSIS — K5000 Crohn's disease of small intestine without complications: Secondary | ICD-10-CM | POA: Diagnosis not present

## 2019-08-13 ENCOUNTER — Encounter: Payer: Self-pay | Admitting: Adult Health

## 2019-08-13 NOTE — Progress Notes (Signed)
Location:  [facility] of Service:  SNF (31) Provider:   Cindi Carbon, ANP [place] [phone]   Gayland Curry, DO  Patient Care Team: Gayland Curry, DO as PCP - General (Geriatric Medicine) [name], [name], MD as Consulting Physician (Cardiology) [name], MD as Attending Physician (Gastroenterology) [name], MD as Consulting Physician (Neurology) Community, [facility] ([place]) Royal Hawthorn, NP as Nurse Practitioner (Nurse Practitioner)  Extended Emergency Contact Information Primary Emergency Contact: [name] Address: [place]          [place] Phone: [phone] Mobile Phone: [phone] Relation: Spouse  Code Status:  DNR Goals of care: Advanced Directive information Advanced Directives 07/13/2019  Does Patient Have a Medical Advance Directive? Yes  Type of Advance Directive Out of facility DNR (pink MOST or yellow form)  Does patient want to make changes to medical advance directive? No - Patient declined  Copy of [place] in Chart? -  Pre-existing out of facility DNR order (yellow form or pink MOST form) Pink MOST/Yellow Form most recent copy in chart - Physician notified to receive inpatient order     Chief Complaint  Patient presents with  . Medical Management of Chronic Issues    HPI:  Pt is a 86 y.o. male seen today for medical management of chronic diseases.    [name] [name] lives in skilled care and operates a motorized wheelchair throughout the facility. He has some short term memory loss but is oriented and able to participate in activities and communicates well. He is enjoying spending more time with his family and going out for meals when possible. He has no acute complaints.   CHF: weight is trending down with no sob, doe , pnd, etc. recently seen by cards with changes in meds Wt Readings  from Last 3 Encounters:  08/13/19 181 lb (82.1 kg)  07/13/19 186 lb (84.4 kg)  07/13/19 184 lb 3.2 oz (83.6 kg)   He is incontinent of urine and stool but this [facility] well [facility] with his toilet schedule. He has Crohn's and takes Questran twice a day. He had a bout of loose stools one week ago but no abd pain and it seemed to have resolved on its on.    Past Medical History:  Diagnosis Date  . Cellulitis   . Chronic diastolic CHF (congestive heart failure) (HCC)    a. EF initially 35-40% after MI 1/09; b. echo 7/08: EF 60%;  c. 11/2014 Echo: EF 65-70%, Gr 1 DD, mild MR.  . Coronary artery disease    a. s/p anterior STEMI 04/2007 with BMS-> LAD;  b. Cath 12/2011 patent stent;  c. low risk nuc 04/2014.  . Crohn's disease ([place])   . Diaphragmatic hernia without mention of obstruction or gangrene   . Diverticulosis of colon (without mention of hemorrhage)   . Fatty liver    a. on ultrasound of 10/2009  . Flatulence, eructation, and gas pain   . Fungal infection   . Gait disorder   . GERD (gastroesophageal reflux disease)   . HOH (hard of hearing)    Hearing aids  . Hyperlipidemia   . Hypertension   . Ischemic cardiomyopathy    a. EF initially 35-40% after MI 1/09; b. echo 7/08: EF 60%;  c. 11/2014 Echo: EF 65-70%, Gr 1 DD, mild MR.  . Melanoma ([place])   .  Memory disorder   . Orthostatic hypotension   . Osteoporosis   . Other chronic nonalcoholic liver disease   . Other esophagitis   . Parkinson's disease ([place])   . RBBB 09/02/2013  . Regional enteritis of small intestine ([place])   . Renal calculi   . Restless leg syndrome 03/22/2015  . 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 Urinary incontinence    Past Surgical History:  Procedure Laterality Date  . APPENDECTOMY    . BACK SURGERY     L3, L4, L5  . CARDIAC CATHETERIZATION  09/25/06   EF 35-40% but more recently 60%  . CATARACT EXTRACTION     Bilateral  . CHOLECYSTECTOMY    . Coronary artery stent placement    .  Melanoma resection    . Partial bowel resection    . TONSILLECTOMY      No Known Allergies  Outpatient Encounter Medications as of 08/12/2019  Medication Sig  . acetaminophen (TYLENOL) 325 MG tablet Take 650 mg by mouth at bedtime. And q 6 hrs prn  . aspirin EC 81 MG tablet Take 81 mg by mouth daily.  Marland Kitchen atorvastatin (LIPITOR) 40 MG tablet Take 40 mg by mouth daily.  . calcium carbonate (CALCIUM 600) 600 MG TABS tablet Take 600 mg by mouth daily with breakfast.  . carbidopa-levodopa (SINEMET CR) 50-200 MG tablet Take 1 tablet by mouth 4 (four) times daily.  . carbidopa-levodopa (SINEMET IR) 25-100 MG tablet Take 1 tablet by mouth 4 (four) times daily.  . chlorhexidine (PERIDEX) 0.12 % solution Use as directed 15 mLs in the mouth or throat 2 (two) times daily.  . Cholecalciferol (VITAMIN D3) 50 MCG (2000 UT) TABS Take 1 tablet by mouth daily.  . cholestyramine (QUESTRAN) 4 g packet TAKE 1 PACKET BY MOUTH TWICE DAILY WITH A MEAL  . donepezil (ARICEPT) 10 MG tablet TAKE ONE TABLET AT BEDTIME  . ipratropium (ATROVENT) 0.03 % nasal spray Place 2 sprays into both nostrils 3 (three) times daily.  . Melatonin 5 MG TABS Take 1 tablet by mouth at bedtime.   . midodrine (PROAMATINE) 5 MG tablet Take 1 tablet (5 mg total) by mouth 2 (two) times daily with a meal.  . nitroGLYCERIN (NITROSTAT) 0.4 MG SL tablet Place 1 tablet (0.4 mg total) under the tongue every 5 (five) minutes as needed for chest pain. PT OVERDUE FOR OV PLEASE CALL FOR APPT  . pantoprazole (PROTONIX) 40 MG tablet Take 40 mg by mouth daily.   . potassium chloride SA (K-DUR,KLOR-CON) 20 MEQ tablet Take 40 mEq by mouth daily.  . Probiotic Product (ALIGN) 4 MG CAPS Take 1 capsule by mouth daily.   . sodium chloride (OCEAN) 0.65 % SOLN nasal spray Place 2 sprays into both nostrils daily as needed for congestion.   . torsemide (DEMADEX) 20 MG tablet Take 1 tablet (20 mg total) by mouth daily.  . vitamin B-12 (CYANOCOBALAMIN) 1000 MCG tablet  Take 1,000 mcg by mouth daily.   No facility-administered encounter medications on file as of 08/12/2019.    Review of Systems  Constitutional: Negative for activity change, appetite change, chills, diaphoresis, fatigue, fever and unexpected weight change.  Respiratory: Negative for cough, shortness of breath, wheezing and stridor.   Cardiovascular: Positive for leg swelling. Negative for chest pain and palpitations.  Gastrointestinal: Negative for abdominal distention, abdominal pain, constipation and diarrhea.       Periodic loose stools  Genitourinary: Negative for difficulty urinating and dysuria.  Musculoskeletal: Positive for  gait problem. Negative for arthralgias, back pain, joint swelling and myalgias.  Skin: Positive for wound.  Neurological: Negative for dizziness, tremors, seizures, syncope, facial asymmetry, speech difficulty, weakness and headaches.       Slow movement, falls  Hematological: Negative for adenopathy. Does not bruise/bleed easily.  Psychiatric/Behavioral: Negative for agitation, behavioral problems and confusion.       Memory loss    Immunization History  Administered Date(s) Administered  . Influenza, High Dose Seasonal PF 01/21/2019  . Influenza-Unspecified 12/06/2014, 11/27/2017  . Moderna SARS-COVID-2 Vaccination 04/13/2019, 05/11/2019  . Pneumococcal Conjugate-13 05/21/2018  . Pneumococcal Polysaccharide-23 05/21/2005  . Tdap 06/03/2006, 10/22/2018  . Zoster Recombinat (Shingrix) 10/22/2018, 12/24/2018   Pertinent  Health Maintenance Due  Topic Date Due  . URINE MICROALBUMIN  09/13/2019 (Originally 01/07/1939)  . INFLUENZA VACCINE  11/07/2019  . PNA vac Low Risk Adult  Completed   Fall Risk  07/20/2019 10/22/2018 05/12/2018 10/03/2017 12/29/2014  Falls in the past year? - 1 1 No Yes  Number falls in past yr: - 1 1 - 1  Injury with Fall? - 1 0 - No  Risk for fall due to : History of fall(s);Impaired balance/gait;Impaired mobility;Mental status  change;Medication side effect History of fall(s);Impaired mobility Impaired mobility;History of fall(s);Impaired balance/gait;Mental status change;Medication side effect - Impaired balance/gait  Follow up Education provided;Falls prevention discussed;Falls evaluation completed Falls evaluation completed;Falls prevention discussed Falls evaluation completed;Falls prevention discussed;Education provided - Falls evaluation completed  Comment at [place] - - - -   Functional Status Survey:    Vitals:   08/13/19 1321  Weight: 181 lb (82.1 kg)   Body mass index is 26.73 kg/m. Physical Exam Vitals and nursing note reviewed.  Constitutional:      General: He is not in acute distress.    Appearance: He is not diaphoretic.  HENT:     Head: Normocephalic and atraumatic.  Neck:     Thyroid: No thyromegaly.     Vascular: No JVD.     Trachea: No tracheal deviation.  Cardiovascular:     Rate and Rhythm: Normal rate and regular rhythm.     Heart sounds: No murmur.  Pulmonary:     Effort: Pulmonary effort is normal. No respiratory distress.     Breath sounds: Normal breath sounds. No wheezing.  Abdominal:     General: Bowel sounds are normal. There is no distension.     Palpations: Abdomen is soft.     Tenderness: There is no abdominal tenderness.  Musculoskeletal:     Right lower leg: Edema (+1) present.     Left lower leg: Edema (+1) present.  Lymphadenopathy:     Cervical: No cervical adenopathy.  Skin:    General: Skin is warm and dry.  Neurological:     Mental Status: He is alert and oriented to person, place, and time.     Cranial Nerves: No cranial nerve deficit.     Labs reviewed: Recent Labs    08/24/18 0000  NA 139  K 4.1  BUN 17  CREATININE 1.1   Recent Labs    08/24/18 0000  AST 48*  ALT 23  ALKPHOS 88   No results for input(s): WBC, NEUTROABS, HGB, HCT, MCV, PLT in the last 8760 hours. Lab Results  Component Value Date   TSH 2.428 11/12/2014   No  results found for: HGBA1C Lab Results  Component Value Date   CHOL 129 08/24/2018   HDL 55 08/24/2018   LDLCALC 44 08/24/2018  TRIG 153 08/24/2018    Significant Diagnostic Results in last 30 days:  No results found.  Assessment/Plan  1. Continuous leakage of urine Continues on a toileting schedule, no new complaints  2. Gastroesophageal reflux disease, unspecified whether esophagitis present Controlled Continue Protonix 40 mg qd. Would not taper due to prior hx of erosive esophagitis and hiatal hernia  3. Crohn's disease of small intestine without complication ([place]) Has occasional loose stools but no exacerbations noted.  Continue Questran 1 packet twice daily  4. Orthostatic hypotension Controlled, continue midodrine 5 mg bid   5. Chronic diastolic CHF (congestive heart failure) (HCC) Recently seen by cardiology, compensated at this time  Continues on torsemide 20 mg qd     Family/ staff Communication: resident and nurse  Labs/tests ordered:  Labs recently update by cardiology,need to put into epic

## 2019-08-18 DIAGNOSIS — H52203 Unspecified astigmatism, bilateral: Secondary | ICD-10-CM | POA: Diagnosis not present

## 2019-08-18 DIAGNOSIS — Z961 Presence of intraocular lens: Secondary | ICD-10-CM | POA: Diagnosis not present

## 2019-08-19 LAB — CBC: RBC: 4.54 (ref 3.87–5.11)

## 2019-08-19 LAB — CBC AND DIFFERENTIAL
HCT: 41 (ref 41–53)
Hemoglobin: 13.5 (ref 13.5–17.5)
Platelets: 138 — AB (ref 150–399)
WBC: 3.1

## 2019-08-27 DIAGNOSIS — D0421 Carcinoma in situ of skin of right ear and external auricular canal: Secondary | ICD-10-CM | POA: Diagnosis not present

## 2019-08-27 DIAGNOSIS — D485 Neoplasm of uncertain behavior of skin: Secondary | ICD-10-CM | POA: Diagnosis not present

## 2019-08-27 DIAGNOSIS — Z85828 Personal history of other malignant neoplasm of skin: Secondary | ICD-10-CM | POA: Diagnosis not present

## 2019-08-27 DIAGNOSIS — Z85820 Personal history of malignant melanoma of skin: Secondary | ICD-10-CM | POA: Diagnosis not present

## 2019-08-27 DIAGNOSIS — L814 Other melanin hyperpigmentation: Secondary | ICD-10-CM | POA: Diagnosis not present

## 2019-08-27 DIAGNOSIS — L821 Other seborrheic keratosis: Secondary | ICD-10-CM | POA: Diagnosis not present

## 2019-08-27 DIAGNOSIS — L570 Actinic keratosis: Secondary | ICD-10-CM | POA: Diagnosis not present

## 2019-09-09 ENCOUNTER — Non-Acute Institutional Stay (SKILLED_NURSING_FACILITY): Payer: PPO | Admitting: Adult Health

## 2019-09-09 ENCOUNTER — Encounter: Payer: Self-pay | Admitting: Adult Health

## 2019-09-09 DIAGNOSIS — I10 Essential (primary) hypertension: Secondary | ICD-10-CM

## 2019-09-09 DIAGNOSIS — I951 Orthostatic hypotension: Secondary | ICD-10-CM | POA: Diagnosis not present

## 2019-09-09 DIAGNOSIS — I5032 Chronic diastolic (congestive) heart failure: Secondary | ICD-10-CM

## 2019-09-09 DIAGNOSIS — G20 Parkinson's disease: Secondary | ICD-10-CM

## 2019-09-09 DIAGNOSIS — I2510 Atherosclerotic heart disease of native coronary artery without angina pectoris: Secondary | ICD-10-CM

## 2019-09-09 DIAGNOSIS — F0280 Dementia in other diseases classified elsewhere without behavioral disturbance: Secondary | ICD-10-CM | POA: Diagnosis not present

## 2019-09-09 NOTE — Progress Notes (Signed)
Location:  Occupational psychologist of Service:  SNF (31) Provider:   Cindi Carbon, ANP [place] [phone]   Gayland Curry, DO  Patient Care Team: Gayland Curry, DO as PCP - General (Geriatric Medicine) [name], [name], MD as Consulting Physician (Cardiology) [name], MD as Attending Physician (Gastroenterology) [name], MD as Consulting Physician (Neurology) Community, [facility] ([place]) Royal Hawthorn, NP as Nurse Practitioner (Nurse Practitioner)  Extended Emergency Contact Information Primary Emergency Contact: [name] Address: [place]          [place] Phone: [phone] Mobile Phone: [phone] Relation: Spouse  Code Status:  DNR Goals of care: Advanced Directive information Advanced Directives 07/13/2019  Does Patient Have a Medical Advance Directive? Yes  Type of Advance Directive Out of facility DNR (pink MOST or yellow form)  Does patient want to make changes to medical advance directive? No - Patient declined  Copy of [place] in Chart? -  Pre-existing out of facility DNR order (yellow form or pink MOST form) Pink MOST/Yellow Form most recent copy in chart - Physician notified to receive inpatient order     Chief Complaint  Patient presents with  . Medical Management of Chronic Issues    HPI:  Pt is a 86 y.o. male seen today for medical management of chronic diseases.    There are no acute complaints regarding his care. He has a hx of memory loss and PD. He continues to operate his motorized chair, participate in activities and is alert and oriented. He is able to transfer himself to his motorized chair and there are no issues with rigidity or tremor.   Bp is controlled 120-140 range.   His weight is stable at 180 lbs, no issues with worsening edema, sob, cp, doe etc  He had an area of his skin  biopsied recently which has healed (preauricular). No pain or oozing from the site. Path report is not here for me to review.    Past Medical History:  Diagnosis Date  . Cellulitis   . Chronic diastolic CHF (congestive heart failure) (HCC)    a. EF initially 35-40% after MI 1/09; b. echo 7/08: EF 60%;  c. 11/2014 Echo: EF 65-70%, Gr 1 DD, mild MR.  . Coronary artery disease    a. s/p anterior STEMI 04/2007 with BMS-> LAD;  b. Cath 12/2011 patent stent;  c. low risk nuc 04/2014.  . Crohn's disease ([place])   . Diaphragmatic hernia without mention of obstruction or gangrene   . Diverticulosis of colon (without mention of hemorrhage)   . Fatty liver    a. on ultrasound of 10/2009  . Flatulence, eructation, and gas pain   . Fungal infection   . Gait disorder   . GERD (gastroesophageal reflux disease)   . HOH (hard of hearing)    Hearing aids  . Hyperlipidemia   . Hypertension   . Ischemic cardiomyopathy    a. EF initially 35-40% after MI 1/09; b. echo 7/08: EF 60%;  c. 11/2014 Echo: EF 65-70%, Gr 1 DD, mild MR.  . Melanoma ([place])   . Memory disorder   . Orthostatic hypotension   . Osteoporosis   . Other chronic nonalcoholic liver disease   . Other esophagitis   . Parkinson's disease ([place])   . RBBB 09/02/2013  . Regional enteritis of small intestine ([place])   . Renal calculi   .  Restless leg syndrome 03/22/2015  . 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 Urinary incontinence    Past Surgical History:  Procedure Laterality Date  . APPENDECTOMY    . BACK SURGERY     L3, L4, L5  . CARDIAC CATHETERIZATION  09/25/06   EF 35-40% but more recently 60%  . CATARACT EXTRACTION     Bilateral  . CHOLECYSTECTOMY    . Coronary artery stent placement    . Melanoma resection    . Partial bowel resection    . TONSILLECTOMY      No Known Allergies  Outpatient Encounter Medications as of 09/09/2019  Medication Sig  . acetaminophen (TYLENOL) 325 MG tablet Take 650 mg by mouth at bedtime. And q 6  hrs prn  . aspirin EC 81 MG tablet Take 81 mg by mouth daily.  Marland Kitchen atorvastatin (LIPITOR) 40 MG tablet Take 40 mg by mouth daily.  . calcium carbonate (CALCIUM 600) 600 MG TABS tablet Take 600 mg by mouth daily with breakfast.  . carbidopa-levodopa (SINEMET CR) 50-200 MG tablet Take 1 tablet by mouth 4 (four) times daily.  . carbidopa-levodopa (SINEMET IR) 25-100 MG tablet Take 1 tablet by mouth 4 (four) times daily.  Marland Kitchen donepezil (ARICEPT) 10 MG tablet TAKE ONE TABLET AT BEDTIME  . Melatonin 5 MG TABS Take 1 tablet by mouth at bedtime.   . midodrine (PROAMATINE) 5 MG tablet Take 1 tablet (5 mg total) by mouth 2 (two) times daily with a meal.  . nitroGLYCERIN (NITROSTAT) 0.4 MG SL tablet Place 1 tablet (0.4 mg total) under the tongue every 5 (five) minutes as needed for chest pain. PT OVERDUE FOR OV PLEASE CALL FOR APPT  . pantoprazole (PROTONIX) 40 MG tablet Take 40 mg by mouth daily.   . potassium chloride SA (K-DUR,KLOR-CON) 20 MEQ tablet Take 40 mEq by mouth daily.  . Probiotic Product (ALIGN) 4 MG CAPS Take 1 capsule by mouth daily.   . sodium chloride (OCEAN) 0.65 % SOLN nasal spray Place 2 sprays into both nostrils daily as needed for congestion.   . torsemide (DEMADEX) 20 MG tablet Take 1 tablet (20 mg total) by mouth daily.  . vitamin B-12 (CYANOCOBALAMIN) 1000 MCG tablet Take 1,000 mcg by mouth daily.  . chlorhexidine (PERIDEX) 0.12 % solution Use as directed 15 mLs in the mouth or throat 2 (two) times daily.  . Cholecalciferol (VITAMIN D3) 50 MCG (2000 UT) TABS Take 1 tablet by mouth daily.  . cholestyramine (QUESTRAN) 4 g packet TAKE 1 PACKET BY MOUTH TWICE DAILY WITH A MEAL  . ipratropium (ATROVENT) 0.03 % nasal spray Place 2 sprays into both nostrils 3 (three) times daily.   No facility-administered encounter medications on file as of 09/09/2019.    Review of Systems  Constitutional: Negative for activity change, appetite change, chills, diaphoresis, fatigue, fever and unexpected  weight change.  Respiratory: Negative for cough, shortness of breath, wheezing and stridor.   Cardiovascular: Positive for leg swelling. Negative for chest pain and palpitations.  Gastrointestinal: Negative for abdominal distention, abdominal pain, constipation and diarrhea.       Periodic loose stools  Genitourinary: Negative for difficulty urinating and dysuria.  Musculoskeletal: Positive for gait problem. Negative for arthralgias, back pain, joint swelling and myalgias.  Skin: Negative for wound.  Neurological: Negative for dizziness, tremors, seizures, syncope, facial asymmetry, speech difficulty, weakness and headaches.  Hematological: Negative for adenopathy. Does not bruise/bleed easily.  Psychiatric/Behavioral: Negative for agitation, behavioral problems and confusion.  Memory loss    Immunization History  Administered Date(s) Administered  . Influenza, High Dose Seasonal PF 01/21/2019  . Influenza-Unspecified 12/06/2014, 11/27/2017  . Moderna SARS-COVID-2 Vaccination 04/13/2019, 05/11/2019  . Pneumococcal Conjugate-13 05/21/2018  . Pneumococcal Polysaccharide-23 05/21/2005  . Tdap 06/03/2006, 10/22/2018  . Zoster Recombinat (Shingrix) 10/22/2018, 12/24/2018   Pertinent  Health Maintenance Due  Topic Date Due  . URINE MICROALBUMIN  09/13/2019 (Originally 01/07/1939)  . INFLUENZA VACCINE  11/07/2019  . PNA vac Low Risk Adult  Completed   Fall Risk  07/20/2019 10/22/2018 05/12/2018 10/03/2017 12/29/2014  Falls in the past year? - 1 1 No Yes  Number falls in past yr: - 1 1 - 1  Injury with Fall? - 1 0 - No  Risk for fall due to : History of fall(s);Impaired balance/gait;Impaired mobility;Mental status change;Medication side effect History of fall(s);Impaired mobility Impaired mobility;History of fall(s);Impaired balance/gait;Mental status change;Medication side effect - Impaired balance/gait  Follow up Education provided;Falls prevention discussed;Falls evaluation completed  Falls evaluation completed;Falls prevention discussed Falls evaluation completed;Falls prevention discussed;Education provided - Falls evaluation completed  Comment at [place] - - - -   Functional Status Survey:    Vitals:   09/09/19 1636  Weight: 180 lb (81.6 kg)   Body mass index is 26.58 kg/m. Physical Exam Vitals and nursing note reviewed.  Constitutional:      General: He is not in acute distress.    Appearance: He is not diaphoretic.  HENT:     Head: Normocephalic and atraumatic.  Neck:     Thyroid: No thyromegaly.     Vascular: No JVD.     Trachea: No tracheal deviation.  Cardiovascular:     Rate and Rhythm: Normal rate and regular rhythm.     Heart sounds: No murmur.  Pulmonary:     Effort: Pulmonary effort is normal. No respiratory distress.     Breath sounds: Normal breath sounds. No wheezing.  Abdominal:     General: Bowel sounds are normal. There is no distension.     Palpations: Abdomen is soft.     Tenderness: There is no abdominal tenderness.  Musculoskeletal:     Right lower leg: Edema (+1) present.     Left lower leg: Edema (+1) present.  Lymphadenopathy:     Cervical: No cervical adenopathy.  Skin:    General: Skin is warm and dry.  Neurological:     Mental Status: He is alert and oriented to person, place, and time.     Cranial Nerves: No cranial nerve deficit.     Comments: No rigidity noted.      Labs reviewed: No results for input(s): NA, K, CL, CO2, GLUCOSE, BUN, CREATININE, CALCIUM, MG, PHOS in the last 8760 hours. No results for input(s): AST, ALT, ALKPHOS, BILITOT, PROT, ALBUMIN in the last 8760 hours. No results for input(s): WBC, NEUTROABS, HGB, HCT, MCV, PLT in the last 8760 hours. Lab Results  Component Value Date   TSH 2.428 11/12/2014   No results found for: HGBA1C Lab Results  Component Value Date   CHOL 129 08/24/2018   HDL 55 08/24/2018   LDLCALC 44 08/24/2018   TRIG 153 08/24/2018    Significant Diagnostic Results  in last 30 days:  No results found.  Assessment/Plan  1. Orthostatic hypotension Continue midodrine 5 mg bid with meals   2. Parkinson disease ([place]) Doing well with current dose of sinemet, followed by Neurology   3. Dementia due to Parkinson's disease without behavioral disturbance (HCC) Mild, no  additional recommendations   4. Coronary artery disease involving native coronary artery of native heart without angina pectoris Hx of stent, continues on lipitor and asa  5. Essential hypertension Controlled  6. Chronic diastolic CHF (congestive heart failure) (HCC) Compensated, continue torsemide 20 mg qd and monitor weight, edema     Family/ staff Communication: resident and nurse  Labs/tests ordered:  NA

## 2019-09-24 ENCOUNTER — Encounter: Payer: Self-pay | Admitting: Internal Medicine

## 2019-09-24 ENCOUNTER — Encounter: Payer: Self-pay | Admitting: Adult Health

## 2019-09-24 ENCOUNTER — Non-Acute Institutional Stay (SKILLED_NURSING_FACILITY): Payer: PPO | Admitting: Adult Health

## 2019-09-24 DIAGNOSIS — F429 Obsessive-compulsive disorder, unspecified: Secondary | ICD-10-CM | POA: Diagnosis not present

## 2019-09-24 NOTE — Progress Notes (Signed)
Location:  Occupational psychologist of Service:  SNF (31) Provider:   Cindi Carbon, ANP [place] [phone]   Gayland Curry, DO  Patient Care Team: Gayland Curry, DO as PCP - General (Geriatric Medicine) [name], [name], MD as Consulting Physician (Cardiology) [name], MD as Attending Physician (Gastroenterology) [name], MD as Consulting Physician (Neurology) Community, [facility] ([place]) Royal Hawthorn, NP as Nurse Practitioner (Nurse Practitioner)  Extended Emergency Contact Information Primary Emergency Contact: [name] Address: [address] [place]          [place],  [address] [name] Phone: [phone] Mobile Phone: [phone] Relation: Spouse  Code Status:  DNR Goals of care: Advanced Directive information Advanced Directives 07/13/2019  Does Patient Have a Medical Advance Directive? Yes  Type of Advance Directive Out of facility DNR (pink MOST or yellow form)  Does patient want to make changes to medical advance directive? No - Patient declined  Copy of [place] in Chart? -  Pre-existing out of facility DNR order (yellow form or pink MOST form) Pink MOST/Yellow Form most recent copy in chart - Physician notified to receive inpatient order     Chief Complaint  Patient presents with  . Acute Visit    concern for OCD    HPI:  Pt is a 86 y.o. male seen today for an acute visit for concern for OCD. I spoke with his wife Ms. Monda. She indicated that [name] has been obsessively using soap, drinking liquids, and seems very rigid about his routine. At meals he lines up multiple drinks 4-5 and has do drink all of them and later has incontinence.  He has a very specific bath routine and obsesses about using soap and taking baths frequently. His wife feels that these behaviors produce anxiety and they are bothersome to her.   They are requesting possible trial of medication to help. He has an underlying hx of memory loss which is mild. These behaviors were not present when he was younger but have progressed over a period of months while [facility].    Past Medical History:  Diagnosis Date  . Cellulitis   . Chronic diastolic CHF (congestive heart failure) (HCC)    a. EF initially 35-40% after MI 1/09; b. echo 7/08: EF 60%;  c. 11/2014 Echo: EF 65-70%, Gr 1 DD, mild MR.  . Coronary artery disease    a. s/p anterior STEMI 04/2007 with BMS-> LAD;  b. Cath 12/2011 patent stent;  c. low risk nuc 04/2014.  . Crohn's disease ([place])   . Diaphragmatic hernia without mention of obstruction or gangrene   . Diverticulosis of colon (without mention of hemorrhage)   . Fatty liver    a. on ultrasound of 10/2009  . Flatulence, eructation, and gas pain   . Fungal infection   . Gait disorder   . GERD (gastroesophageal reflux disease)   . HOH (hard of hearing)    Hearing aids  . Hyperlipidemia   . Hypertension   . Ischemic cardiomyopathy    a. EF initially 35-40% after MI 1/09; b. echo 7/08: EF 60%;  c. 11/2014 Echo: EF 65-70%, Gr 1 DD, mild MR.  . Melanoma ([place])   . Memory disorder   . Orthostatic hypotension   . Osteoporosis   . Other chronic nonalcoholic liver disease   . Other esophagitis   .  Parkinson's disease ([place])   . RBBB 09/02/2013  . Regional enteritis of small intestine ([place])   . Renal calculi   . Restless leg syndrome 03/22/2015  . Ulcerative (chronic) ileocolitis ([place])   . Urinary incontinence    Past Surgical History:  Procedure Laterality Date  . APPENDECTOMY    . BACK SURGERY     L3, L4, L5  . CARDIAC CATHETERIZATION  09/25/06   EF 35-40% but more recently 60%  . CATARACT EXTRACTION     Bilateral  . CHOLECYSTECTOMY    . Coronary artery stent placement    . Melanoma resection    . Partial bowel resection    . TONSILLECTOMY      No Known Allergies  Outpatient  Encounter Medications as of 09/24/2019  Medication Sig  . sertraline (ZOLOFT) 25 MG tablet Take 25 mg by mouth at bedtime.  Marland Kitchen acetaminophen (TYLENOL) 325 MG tablet Take 650 mg by mouth at bedtime. And q 6 hrs prn  . aspirin EC 81 MG tablet Take 81 mg by mouth daily.  Marland Kitchen atorvastatin (LIPITOR) 40 MG tablet Take 40 mg by mouth daily.  . calcium carbonate (CALCIUM 600) 600 MG TABS tablet Take 600 mg by mouth daily with breakfast.  . carbidopa-levodopa (SINEMET CR) 50-200 MG tablet Take 1 tablet by mouth 4 (four) times daily.  . carbidopa-levodopa (SINEMET IR) 25-100 MG tablet Take 1 tablet by mouth 4 (four) times daily.  . chlorhexidine (PERIDEX) 0.12 % solution Use as directed 15 mLs in the mouth or throat 2 (two) times daily.  . Cholecalciferol (VITAMIN D3) 50 MCG (2000 UT) TABS Take 1 tablet by mouth daily.  . cholestyramine (QUESTRAN) 4 g packet TAKE 1 PACKET BY MOUTH TWICE DAILY WITH A MEAL  . donepezil (ARICEPT) 10 MG tablet TAKE ONE TABLET AT BEDTIME  . ipratropium (ATROVENT) 0.03 % nasal spray Place 2 sprays into both nostrils 3 (three) times daily.  . Melatonin 5 MG TABS Take 1 tablet by mouth at bedtime.   . midodrine (PROAMATINE) 5 MG tablet Take 1 tablet (5 mg total) by mouth 2 (two) times daily with a meal.  . nitroGLYCERIN (NITROSTAT) 0.4 MG SL tablet Place 1 tablet (0.4 mg total) under the tongue every 5 (five) minutes as needed for chest pain. PT OVERDUE FOR OV PLEASE CALL FOR APPT  . pantoprazole (PROTONIX) 40 MG tablet Take 40 mg by mouth daily.   . potassium chloride SA (K-DUR,KLOR-CON) 20 MEQ tablet Take 40 mEq by mouth daily.  . Probiotic Product (ALIGN) 4 MG CAPS Take 1 capsule by mouth daily.   . sodium chloride (OCEAN) 0.65 % SOLN nasal spray Place 2 sprays into both nostrils daily as needed for congestion.   . torsemide (DEMADEX) 20 MG tablet Take 1 tablet (20 mg total) by mouth daily.  . vitamin B-12 (CYANOCOBALAMIN) 1000 MCG tablet Take 1,000 mcg by mouth daily.   No  facility-administered encounter medications on file as of 09/24/2019.    Review of Systems  Constitutional: Positive for appetite change (obsessively drinks liquids). Negative for activity change, chills, diaphoresis, fatigue, fever and unexpected weight change.  Psychiatric/Behavioral: Positive for behavioral problems. Negative for agitation, confusion, decreased concentration, dysphoric mood, hallucinations, self-injury, sleep disturbance and suicidal ideas. The patient is not nervous/anxious and is not hyperactive.        Particular about routines, hoards, excessively baths and uses soap.     Immunization History  Administered Date(s) Administered  . Influenza, High Dose Seasonal PF 01/21/2019  .  Influenza-Unspecified 12/06/2014, 11/27/2017  . Moderna SARS-COVID-2 Vaccination 04/13/2019, 05/11/2019  . Pneumococcal Conjugate-13 05/21/2018  . Pneumococcal Polysaccharide-23 05/21/2005  . Tdap 06/03/2006, 10/22/2018  . Zoster Recombinat (Shingrix) 10/22/2018, 12/24/2018   Pertinent  Health Maintenance Due  Topic Date Due  . URINE MICROALBUMIN  Never done  . INFLUENZA VACCINE  11/07/2019  . PNA vac Low Risk Adult  Completed   Fall Risk  07/20/2019 10/22/2018 05/12/2018 10/03/2017 12/29/2014  Falls in the past year? - 1 1 No Yes  Number falls in past yr: - 1 1 - 1  Injury with Fall? - 1 0 - No  Risk for fall due to : History of fall(s);Impaired balance/gait;Impaired mobility;Mental status change;Medication side effect History of fall(s);Impaired mobility Impaired mobility;History of fall(s);Impaired balance/gait;Mental status change;Medication side effect - Impaired balance/gait  Follow up Education provided;Falls prevention discussed;Falls evaluation completed Falls evaluation completed;Falls prevention discussed Falls evaluation completed;Falls prevention discussed;Education provided - Falls evaluation completed  Comment at [place] - - - -   Functional Status Survey:    There were no  vitals filed for this visit. There is no height or weight on file to calculate BMI. Physical Exam Vitals and nursing note reviewed.  Neurological:     General: No focal deficit present.     Mental Status: He is alert and oriented to person, place, and time.  Psychiatric:        Mood and Affect: Mood normal.     Labs reviewed: No results for input(s): NA, K, CL, CO2, GLUCOSE, BUN, CREATININE, CALCIUM, MG, PHOS in the last 8760 hours. No results for input(s): AST, ALT, ALKPHOS, BILITOT, PROT, ALBUMIN in the last 8760 hours. Recent Labs    08/19/19 0000  WBC 3.1  HGB 13.5  HCT 41  PLT 138*   Lab Results  Component Value Date   TSH 2.428 11/12/2014   No results found for: HGBA1C Lab Results  Component Value Date   CHOL 129 08/24/2018   HDL 55 08/24/2018   LDLCALC 44 08/24/2018   TRIG 153 08/24/2018    Significant Diagnostic Results in last 30 days:  No results found.  Assessment/Plan 1. Obsessive-compulsive disorder, unspecified type Begin Zoloft 25 mg qhs. Discussed s/e with his wife which includes weight gain and she agreed to try the mediation as his symptoms as listed in the HPI are quite troublesome. Will need to give this 4-8 weeks to work and may need dose titration. We will monitor his weight and address accordingly     Family/ staff Communication: discussed with his wife Ms. Turkington   Labs/tests ordered:  NA

## 2019-09-30 ENCOUNTER — Ambulatory Visit: Payer: PPO | Admitting: Neurology

## 2019-09-30 ENCOUNTER — Other Ambulatory Visit: Payer: Self-pay

## 2019-09-30 ENCOUNTER — Encounter: Payer: Self-pay | Admitting: Internal Medicine

## 2019-09-30 ENCOUNTER — Encounter: Payer: Self-pay | Admitting: Neurology

## 2019-09-30 VITALS — BP 123/74 | HR 102 | Ht 69.0 in

## 2019-09-30 DIAGNOSIS — G20 Parkinson's disease: Secondary | ICD-10-CM | POA: Diagnosis not present

## 2019-09-30 DIAGNOSIS — G2581 Restless legs syndrome: Secondary | ICD-10-CM | POA: Diagnosis not present

## 2019-09-30 DIAGNOSIS — R269 Unspecified abnormalities of gait and mobility: Secondary | ICD-10-CM | POA: Diagnosis not present

## 2019-09-30 NOTE — Progress Notes (Signed)
Reason for visit: Parkinson's disease, memory disturbance, gait disturbance  [name] [name] is an 86 y.o. male  History of present illness:  [name] [name] is an 86 year old right-handed white male with a history of Parkinson's disease.  He has had a decline in his overall functioning level over time, he still gets physical therapy at least twice a week, he is able to walk short distances with a walker.  He has not had any falls and he is good about asking for help before he tries to walk.  He has had some OCD behavior with frequent bathing and washing, wanting to go outside frequently.  They have initiated Zoloft at 25 mg daily but the family wanted a sign off from our office before they continue to use the medication.  The patient has not had any troubles with dizziness with standing, he has not had any fainting episodes.  He is swallowing fairly well without choking.  He sleeps well at night, he denies any hallucinations.  He remains on Sinemet CR 50/200 mg tablet, he takes 1 tablet 4 times daily.  He takes 1 full Sinemet IR tablet 25/100 mg taking 1 tablet 4 times a day with the CR.  The patient returns to this office for an evaluation.  Past Medical History:  Diagnosis Date  . Cellulitis   . Chronic diastolic CHF (congestive heart failure) (HCC)    a. EF initially 35-40% after MI 1/09; b. echo 7/08: EF 60%;  c. 11/2014 Echo: EF 65-70%, Gr 1 DD, mild MR.  . Coronary artery disease    a. s/p anterior STEMI 04/2007 with BMS-> LAD;  b. Cath 12/2011 patent stent;  c. low risk nuc 04/2014.  . Crohn's disease ([place])   . Diaphragmatic hernia without mention of obstruction or gangrene   . Diverticulosis of colon (without mention of hemorrhage)   . Fatty liver    a. on ultrasound of 10/2009  . Flatulence, eructation, and gas pain   . Fungal infection   . Gait disorder   . GERD (gastroesophageal reflux disease)   . HOH (hard of hearing)    Hearing aids  . Hyperlipidemia   . Hypertension   . Ischemic  cardiomyopathy    a. EF initially 35-40% after MI 1/09; b. echo 7/08: EF 60%;  c. 11/2014 Echo: EF 65-70%, Gr 1 DD, mild MR.  . Melanoma ([place])   . Memory disorder   . Orthostatic hypotension   . Osteoporosis   . Other chronic nonalcoholic liver disease   . Other esophagitis   . Parkinson's disease ([place])   . RBBB 09/02/2013  . Regional enteritis of small intestine ([place])   . Renal calculi   . Restless leg syndrome 03/22/2015  . 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 Urinary incontinence     Past Surgical History:  Procedure Laterality Date  . APPENDECTOMY    . BACK SURGERY     L3, L4, L5  . CARDIAC CATHETERIZATION  09/25/06   EF 35-40% but more recently 60%  . CATARACT EXTRACTION     Bilateral  . CHOLECYSTECTOMY    . Coronary artery stent placement    . Melanoma resection    . Partial bowel resection    . TONSILLECTOMY      Family History  Problem Relation Age of Onset  . Heart failure Mother   . Heart attack Mother   . Hypertension Mother   . Emphysema Father   . Heart disease Brother   .  Heart disease Brother   . Coronary artery disease Other   . Colon cancer Brother        older at dx  . Stomach cancer Neg Hx     Social history:  reports that he has never smoked. He has never used smokeless tobacco. He reports current alcohol use of about 2.0 - 3.0 standard drinks of alcohol per week. He reports that he does not use drugs.   No Known Allergies  Medications:  Prior to Admission medications   Medication Sig Start Date End Date Taking? Authorizing Provider  acetaminophen (TYLENOL) 325 MG tablet Take 650 mg by mouth at bedtime. And q 6 hrs prn   Yes [provider]  aspirin EC 81 MG tablet Take 81 mg by mouth daily.   Yes [provider]  atorvastatin (LIPITOR) 40 MG tablet Take 40 mg by mouth daily.   Yes [provider]  calcium carbonate (CALCIUM 600) 600 MG TABS tablet Take 600 mg by mouth daily with breakfast.   Yes [provider]  carbidopa-levodopa (SINEMET CR) 50-200 MG tablet Take 1 tablet by mouth 4 (four) times daily. 10/03/17  Yes [name], MD  carbidopa-levodopa (SINEMET IR) 25-100 MG tablet Take 1 tablet by mouth 4 (four) times daily.   Yes [provider]  chlorhexidine (PERIDEX) 0.12 % solution Use as directed 15 mLs in the mouth or throat 2 (two) times daily.   Yes [provider]  Cholecalciferol (VITAMIN D3) 50 MCG (2000 UT) TABS Take 1 tablet by mouth daily.   Yes [provider]  cholestyramine (QUESTRAN) 4 g packet TAKE 1 PACKET BY MOUTH TWICE DAILY WITH A MEAL 01/16/17  Yes [name], MD  donepezil (ARICEPT) 10 MG tablet TAKE ONE TABLET AT BEDTIME 12/10/17  Yes [name], MD  ipratropium (ATROVENT) 0.03 % nasal spray Place 2 sprays into both nostrils 3 (three) times daily.   Yes [provider]  Melatonin 5 MG TABS Take 1 tablet by mouth at bedtime.    Yes [provider]  midodrine (PROAMATINE) 5 MG tablet Take 1 tablet (5 mg total) by mouth 2 (two) times daily with a meal. 11/23/14  Yes Dunn, Dayna N, PA-C  nitroGLYCERIN (NITROSTAT) 0.4 MG SL tablet Place 1 tablet (0.4 mg total) under the tongue every 5 (five) minutes as needed for chest pain. PT OVERDUE FOR OV PLEASE CALL FOR APPT 07/07/17  Yes [name], [name], MD  pantoprazole (PROTONIX) 40 MG tablet Take 40 mg by mouth daily.  12/30/14  Yes [provider]  potassium chloride SA (K-DUR,KLOR-CON) 20 MEQ tablet Take 40 mEq by mouth daily.   Yes [provider]  Probiotic Product (ALIGN) 4 MG CAPS Take 1 capsule by mouth daily.    Yes [provider]  sertraline (ZOLOFT) 25 MG tablet Take 25 mg by mouth at bedtime.   Yes [provider]  sodium chloride (OCEAN) 0.65 % SOLN nasal spray Place 2 sprays into both nostrils daily as needed for congestion.    Yes [provider]  torsemide (DEMADEX) 20 MG tablet Take 1 tablet (20 mg total) by  mouth daily. 11/23/14  Yes Dunn, Dayna N, PA-C  vitamin B-12 (CYANOCOBALAMIN) 1000 MCG tablet Take 1,000 mcg by mouth daily.   Yes [provider]    ROS:  Out of a complete 14 system review of symptoms, the patient complains only of the following symptoms, and all other reviewed systems are  negative.  Leg weakness, walking difficulty Mild memory problems  Blood pressure 123/74, pulse (!) 102, height 5' 9"  (1.753 m).   Blood pressure, right arm, standing is 128/64.  Physical Exam  General: The patient is alert and cooperative at the time of the examination.  The patient is moderately obese.  Skin: 2+ edema in below the knees is seen bilaterally.   Neurologic Exam  Mental status: The patient is alert and oriented x 3 at the time of the examination. The Mini-Mental status examination done today shows a total score of 27/30.  The patient is able to name 8 four-legged animals in 60 seconds..   Cranial nerves: Facial symmetry is present. Speech is normal, no aphasia or dysarthria is noted. Extraocular movements are full. Visual fields are full.  Masking of the face is seen.  Motor: The patient has good strength in all 4 extremities.  Sensory examination: Soft touch sensation is symmetric on the face, arms, and legs.  Coordination: The patient has good finger-nose-finger and heel-to-shin bilaterally.  Gait and station: The patient requires some assistance with standing, once up, he can take a few steps with examiner, with short shuffling steps.  He has some slowness with turns.  He has a slight tendency to lean backwards.  Reflexes: Deep tendon reflexes are symmetric.   Assessment/Plan:  1.  Parkinson's disease  2.  Mild memory disturbance  3.  Gait disturbance  4.  OCD behavior  The patient may go on Zoloft, they had ordered a 25 mg dose for him.  There is no contraindication to this.  The patient will continue on his current dose of Sinemet.  He is not  orthostatic on today's examination.  He will continue physical therapy, try to stay active.  He does have a motorized chair for better mobility as his extended care facility, he lives at [facility].  He will follow-up here in 5 months.  Jill Alexanders MD 09/30/2019 3:21 PM  [facility] [address] [place], [place] [address]  Phone [phone] Fax [phone]

## 2019-10-12 DIAGNOSIS — D696 Thrombocytopenia, unspecified: Secondary | ICD-10-CM | POA: Diagnosis not present

## 2019-10-12 DIAGNOSIS — D649 Anemia, unspecified: Secondary | ICD-10-CM | POA: Diagnosis not present

## 2019-10-12 LAB — CBC AND DIFFERENTIAL
HCT: 40 — AB (ref 41–53)
Hemoglobin: 13.6 (ref 13.5–17.5)
Neutrophils Absolute: 5
Platelets: 241 (ref 150–399)
WBC: 8.1

## 2019-10-12 LAB — CBC: RBC: 4.23 (ref 3.87–5.11)

## 2019-11-11 ENCOUNTER — Encounter: Payer: Self-pay | Admitting: Adult Health

## 2019-11-11 ENCOUNTER — Non-Acute Institutional Stay (SKILLED_NURSING_FACILITY): Payer: PPO | Admitting: Adult Health

## 2019-11-11 DIAGNOSIS — F0280 Dementia in other diseases classified elsewhere without behavioral disturbance: Secondary | ICD-10-CM | POA: Diagnosis not present

## 2019-11-11 DIAGNOSIS — Z0000 Encounter for general adult medical examination without abnormal findings: Secondary | ICD-10-CM | POA: Diagnosis not present

## 2019-11-11 DIAGNOSIS — I5032 Chronic diastolic (congestive) heart failure: Secondary | ICD-10-CM

## 2019-11-11 DIAGNOSIS — F429 Obsessive-compulsive disorder, unspecified: Secondary | ICD-10-CM | POA: Diagnosis not present

## 2019-11-11 DIAGNOSIS — G20 Parkinson's disease: Secondary | ICD-10-CM | POA: Diagnosis not present

## 2019-11-11 DIAGNOSIS — K5000 Crohn's disease of small intestine without complications: Secondary | ICD-10-CM

## 2019-11-11 DIAGNOSIS — K219 Gastro-esophageal reflux disease without esophagitis: Secondary | ICD-10-CM

## 2019-11-11 NOTE — Progress Notes (Signed)
Subjective:   [name] [name] is a 86 y.o. male who presents for Medicare Annual/Subsequent preventive examination provided at [facility] skilled care setting.   Review of Systems          Objective:    Today's Vitals   11/11/19 1014  Weight: 181 lb 12.8 oz (82.5 kg)   Body mass index is 26.85 kg/m.  Advanced Directives 11/11/2019 07/13/2019 04/27/2019 01/26/2019 10/22/2018 02/24/2018 12/23/2017  Does Patient Have a Medical Advance Directive? Yes Yes Yes Yes Yes Yes Yes  Type of Paramedic of [place];Out of facility DNR (pink MOST or yellow form);Living will Out of facility DNR (pink MOST or yellow form) Healthcare Power of [place];Living will;Out of facility DNR (pink MOST or yellow form) [place];Living will [place];Living will;Out of facility DNR (pink MOST or yellow form) [place];Living will;Out of facility DNR (pink MOST or yellow form)  Does patient want to make changes to medical advance directive? No - Patient declined No - Patient declined No - Patient declined No - Patient declined No - Patient declined No - Patient declined No - Patient declined  Copy of [place] in Chart? Yes - validated most recent copy scanned in chart (See row information) - - Yes - validated most recent copy scanned in chart (See row information) Yes - validated most recent copy scanned in chart (See row information) Yes - validated most recent copy scanned in chart (See row information) Yes  Pre-existing out of facility DNR order (yellow form or pink MOST form) - Pink MOST/Yellow Form most recent copy in chart - Physician notified to receive inpatient order - Yellow form placed in chart (order not valid for inpatient use) - Yellow form placed in chart (order not valid for inpatient use) Yellow form placed in chart (order not valid for inpatient use)    Current Medications  (verified) Outpatient Encounter Medications as of 11/11/2019  Medication Sig   acetaminophen (TYLENOL) 325 MG tablet Take 650 mg by mouth at bedtime. And q 6 hrs prn   aspirin EC 81 MG tablet Take 81 mg by mouth daily.   atorvastatin (LIPITOR) 40 MG tablet Take 40 mg by mouth daily.   calcium carbonate (CALCIUM 600) 600 MG TABS tablet Take 600 mg by mouth daily with breakfast.   carbidopa-levodopa (SINEMET CR) 50-200 MG tablet Take 1 tablet by mouth 4 (four) times daily.   carbidopa-levodopa (SINEMET IR) 25-100 MG tablet Take 1 tablet by mouth 4 (four) times daily.   chlorhexidine (PERIDEX) 0.12 % solution Use as directed 15 mLs in the mouth or throat 2 (two) times daily.   Cholecalciferol (VITAMIN D3) 50 MCG (2000 UT) TABS Take 1 tablet by mouth daily.   cholestyramine (QUESTRAN) 4 g packet TAKE 1 PACKET BY MOUTH TWICE DAILY WITH A MEAL   donepezil (ARICEPT) 10 MG tablet TAKE ONE TABLET AT BEDTIME   ipratropium (ATROVENT) 0.03 % nasal spray Place 2 sprays into both nostrils 3 (three) times daily.   Melatonin 5 MG TABS Take 1 tablet by mouth at bedtime.    midodrine (PROAMATINE) 5 MG tablet Take 1 tablet (5 mg total) by mouth 2 (two) times daily with a meal.   nitroGLYCERIN (NITROSTAT) 0.4 MG SL tablet Place 1 tablet (0.4 mg total) under the tongue every 5 (five) minutes as needed for chest pain. PT OVERDUE FOR OV PLEASE CALL FOR APPT   pantoprazole (PROTONIX) 40  MG tablet Take 40 mg by mouth daily.    potassium chloride SA (K-DUR,KLOR-CON) 20 MEQ tablet Take 40 mEq by mouth daily.   Probiotic Product (ALIGN) 4 MG CAPS Take 1 capsule by mouth daily.    sertraline (ZOLOFT) 25 MG tablet Take 25 mg by mouth at bedtime.   sodium chloride (OCEAN) 0.65 % SOLN nasal spray Place 2 sprays into both nostrils daily as needed for congestion.    torsemide (DEMADEX) 20 MG tablet Take 1 tablet (20 mg total) by mouth daily.   vitamin B-12 (CYANOCOBALAMIN) 1000 MCG tablet Take 1,000 mcg by  mouth daily.   No facility-administered encounter medications on file as of 11/11/2019.    Allergies (verified) Patient has no known allergies.   History: Past Medical History:  Diagnosis Date   Cellulitis    Chronic diastolic CHF (congestive heart failure) ([place])    a. EF initially 35-40% after MI 1/09; b. echo 7/08: EF 60%;  c. 11/2014 Echo: EF 65-70%, Gr 1 DD, mild MR.   Coronary artery disease    a. s/p anterior STEMI 04/2007 with BMS-> LAD;  b. Cath 12/2011 patent stent;  c. low risk nuc 04/2014.   Crohn's disease ([place])    Diaphragmatic hernia without mention of obstruction or gangrene    Diverticulosis of colon (without mention of hemorrhage)    Fatty liver    a. on ultrasound of 10/2009   Flatulence, eructation, and gas pain    Fungal infection    Gait disorder    GERD (gastroesophageal reflux disease)    HOH (hard of hearing)    Hearing aids   Hyperlipidemia    Hypertension    Ischemic cardiomyopathy    a. EF initially 35-40% after MI 1/09; b. echo 7/08: EF 60%;  c. 11/2014 Echo: EF 65-70%, Gr 1 DD, mild MR.   Melanoma ([place])    Memory disorder    Orthostatic hypotension    Osteoporosis    Other chronic nonalcoholic liver disease    Other esophagitis    Parkinson's disease ([place])    RBBB 09/02/2013   Regional enteritis of small intestine (HCC)    Renal calculi    Restless leg syndrome 03/22/2015   Ulcerative (chronic) ileocolitis (HCC)    Urinary incontinence    Past Surgical History:  Procedure Laterality Date   APPENDECTOMY     BACK SURGERY     L3, L4, L5   CARDIAC CATHETERIZATION  09/25/06   EF 35-40% but more recently 60%   CATARACT EXTRACTION     Bilateral   CHOLECYSTECTOMY     Coronary artery stent placement     Melanoma resection     Partial bowel resection     TONSILLECTOMY     Family History  Problem Relation Age of Onset   Heart failure Mother    Heart attack Mother    Hypertension Mother    Emphysema  Father    Heart disease Brother    Heart disease Brother    Coronary artery disease Other    Colon cancer Brother        older at dx   Stomach cancer Neg Hx    Social History   Socioeconomic History   Marital status: Married    Spouse name: [name]   Number of children: 3   Years of education: 16   Highest education level: Bachelor's degree (e.g., BA, AB, BS)  Occupational History   Occupation: CEO-retired    Employer: [employer]  Occupation: Licensed conveyancer: [employer]  Tobacco Use   Smoking status: Never Smoker   Smokeless tobacco: Never Used  Vaping Use   Vaping Use: Never used  Substance and Sexual Activity   Alcohol use: Yes    Alcohol/week: 2.0 - 3.0 standard drinks    Types: 2 - 3 Standard drinks or equivalent per week    Comment: occassionally   Drug use: No   Sexual activity: Not Currently  Other Topics Concern   Not on file  Social History Narrative   Lives w/ his wife at [place]   Patient is right handed.   Patient drinks very little caffeine.   Social Determinants of Health   Financial Resource Strain:    Difficulty of Paying Living Expenses:   Food Insecurity: No Food Insecurity   Worried About Charity fundraiser in the Last Year: Never true   Arboriculturist in the Last Year: Never true  Transportation Needs:    Film/video editor (Medical):    Lack of Transportation (Non-Medical):   Physical Activity: Unknown   Days of Exercise per Week: 3 days   Minutes of Exercise per Session: Not on file  Stress:    Feeling of Stress :   Social Connections:    Frequency of Communication with Friends and Family:    Frequency of Social Gatherings with Friends and Family:    Attends Religious Services:    Active Member of Clubs or Organizations:    Attends Music therapist:    Marital Status:     Tobacco Counseling Counseling given: Not Answered   Clinical  Intake:  Pre-visit preparation completed: No  Pain : No/denies pain     BMI - recorded: 26.8 Nutritional Status: BMI 25 -29 Overweight Diabetes: No  How often do you need to have someone help you when you read instructions, pamphlets, or other written materials from your doctor or pharmacy?: 3 - Sometimes What is the last grade level you completed in school?: 4 year college and business school and [employer]  Diabetic?no  Interpreter Needed?: No  Information entered by :: Electronics engineer NP   Activities of Daily Living No flowsheet data found.  Patient Care Team: Gayland Curry, DO as PCP - General (Geriatric Medicine) [name], [name], MD as Consulting Physician (Cardiology) [name], MD as Attending Physician (Gastroenterology) [name], MD as Consulting Physician (Neurology) Community, [facility] ([place]) Royal Hawthorn, NP as Nurse Practitioner (Nurse Practitioner)  Indicate any recent Medical Services you may have received from other than [facility] providers in the past year (date may be approximate).     Assessment:   This is a routine wellness examination for [facility].  Hearing/Vision screen No exam data present  Dietary issues and exercise activities discussed:    Goals     Exercise 3x per week (30 min per time)     Exercise 3x per week (30 min per time)      Depression Screen PHQ 2/9 Scores 11/11/2019 10/22/2018 05/20/2018 12/18/2017 12/29/2014  PHQ - 2 Score 0 0 0 0 0    Fall Risk Fall Risk  11/11/2019 07/20/2019 10/22/2018 05/12/2018 10/03/2017  Falls in the past year? 1 - 1 1 No  Number falls in past yr: 0 - 1 1 -  Injury with Fall? 1 - 1 0 -  Risk for fall due to : History of fall(s);Impaired balance/gait History of  fall(s);Impaired balance/gait;Impaired mobility;Mental status change;Medication side effect History of fall(s);Impaired mobility Impaired mobility;History of fall(s);Impaired  balance/gait;Mental status change;Medication side effect -  Follow up Falls evaluation completed Education provided;Falls prevention discussed;Falls evaluation completed Falls evaluation completed;Falls prevention discussed Falls evaluation completed;Falls prevention discussed;Education provided -  Comment - at [place] - - -    Any stairs in or around the home? No  If so, are there any without handrails? Yes  Home free of loose throw rugs in walkways, pet beds, electrical cords, etc? Yes  Adequate lighting in your home to reduce risk of falls? Yes   ASSISTIVE DEVICES UTILIZED TO PREVENT FALLS:  Life alert? Yes  Use of a cane, walker or w/c? Yes  Grab bars in the bathroom? Yes  Shower chair or bench in shower? Yes  Elevated toilet seat or a handicapped toilet? Yes   TIMED UP AND GO:  Was the test performed? No .  Length of time to ambulate 10 feet: not indicated sec.   Gait unsteady with use of assistive device, provider informed and education provided.   Cognitive Function: MMSE - Mini Mental State Exam 11/11/2019 09/30/2019 05/24/2019 12/16/2018 10/22/2018  Not completed: - - (No Data) - -  Orientation to time 5 5 4 3 4   Orientation to Place 4 5 4 5 5   Registration 3 3 3 3 3   Attention/ Calculation 3 2 5 5 5   Recall 2 3 1 3 3   Language- name 2 objects 2 2 2 2 2   Language- repeat 1 1 1  0 1  Language- follow 3 step command 3 3 3 3 3   Language- read & follow direction 1 1 1 1 1   Write a sentence 1 1 1 1 1   Copy design 1 1 1 1 1   Total score 26 27 26 27 29         Immunizations Immunization History  Administered Date(s) Administered   Influenza, High Dose Seasonal PF 01/21/2019   Influenza-Unspecified 12/06/2014, 11/27/2017   Moderna SARS-COVID-2 Vaccination 04/13/2019, 05/11/2019   Pneumococcal Conjugate-13 05/21/2018   Pneumococcal Polysaccharide-23 05/21/2005   Tdap 06/03/2006, 10/22/2018   Zoster Recombinat (Shingrix) 10/22/2018, 12/24/2018    TDAP status:  Up to date Flu Vaccine status: Up to date Pneumococcal vaccine status: Up to date Covid-19 vaccine status: Completed vaccines  Qualifies for Shingles Vaccine? No   Zostavax completed No   Shingrix Completed?: Yes  Screening Tests Health Maintenance  Topic Date Due   URINE MICROALBUMIN  Never done   INFLUENZA VACCINE  11/07/2019   TETANUS/TDAP  10/21/2028   COVID-19 Vaccine  Completed   PNA vac Low Risk Adult  Completed    Health Maintenance  Health Maintenance Due  Topic Date Due   URINE MICROALBUMIN  Never done   INFLUENZA VACCINE  11/07/2019    Colorectal cancer screening: No longer required.   Lung Cancer Screening: (Low Dose CT Chest recommended if Age 84-80 years, 30 pack-year currently smoking OR have quit w/in 15years.) does not qualify.   Lung Cancer Screening Referral: NA  Additional Screening:  Hepatitis C Screening: does not qualify;  Vision Screening: Recommended annual ophthalmology exams for early detection of glaucoma and other disorders of the eye. Is the patient up to date with their annual eye exam?  No  Who is the provider or what is the name of the office in which the patient attends annual eye exams?  If pt is not established with a provider, would they like to be referred to a  provider to establish care? No .   Dental Screening: Recommended annual dental exams for proper oral hygiene  Community Resource Referral / Chronic Care Management: CRR required this visit?  No   CCM required this visit?  No      Plan:     I have personally reviewed and noted the following in the patients chart:    Medical and social history  Use of alcohol, tobacco or illicit drugs   Current medications and supplements  Functional ability and status  Nutritional status  Physical activity  Advanced directives  List of other physicians  Hospitalizations, surgeries, and ER visits in previous 12 months  Vitals  Screenings to include  cognitive, depression, and falls  Referrals and appointments  In addition, I have reviewed and discussed with patient certain preventive protocols, quality metrics, and best practice recommendations. A written personalized care plan for preventive services as well as general preventive health recommendations were provided to patient.     Royal Hawthorn, NP   11/11/2019   Nurse Notes: NA

## 2019-11-11 NOTE — Progress Notes (Signed)
Location:  Occupational psychologist of Service:  SNF (31) Provider:   Cindi Carbon, ANP [place] [phone]   Gayland Curry, DO  Patient Care Team: Gayland Curry, DO as PCP - General (Geriatric Medicine) [name], [name], MD as Consulting Physician (Cardiology) [name], MD as Attending Physician (Gastroenterology) [name], MD as Consulting Physician (Neurology) Community, [facility] ([place]) Royal Hawthorn, NP as Nurse Practitioner (Nurse Practitioner)  Extended Emergency Contact Information Primary Emergency Contact: [name] Address: [address] [place] [place]          [place], [place] [address] [name] of [place] Phone: [phone] Mobile Phone: [phone] Relation: Spouse  Code Status:  DNR Goals of care: Advanced Directive information Advanced Directives 11/11/2019  Does Patient Have a Medical Advance Directive? Yes  Type of Paramedic of [place];Out of facility DNR (pink MOST or yellow form);Living will  Does patient want to make changes to medical advance directive? No - Patient declined  Copy of [place] in Chart? Yes - validated most recent copy scanned in chart (See row information)  Pre-existing out of facility DNR order (yellow form or pink MOST form) -     Chief Complaint  Patient presents with   Medical Management of Chronic Issues    HPI:  Pt is a 86 y.o. male seen today for medical management of chronic diseases.    [name] [name] was started on Zoloft last month for concerns for OCD behavior. The staff have not noticed an appreciable difference at this point. He has very specific ritualistic routines in the morning and at meals and this can creat anxiety.   He has not had any issues of worsening edema or sob. His weight is above goal but stable.    He denies any issues with diarrhea or abd pain. He remains on  Questran for Crohn's.  Continues on Aricept for memory loss. His MMSE scores have been rather stable. He requires assistance for dressing, bathing, and food set up. Remains active at [facility] in participation. Can walk short distances with a walker.  MMSE - Mini Mental State Exam 11/11/2019 09/30/2019 05/24/2019 12/16/2018 10/22/2018 02/20/2018 10/03/2017  Not completed: - - (No Data) - - - -  Orientation to time 5 5 4 3 4 4 4   Orientation to Place 4 5 4 5 5 5 5   Registration 3 3 3 3 3 3 3   Attention/ Calculation 3 2 5 5 5 4 1   Recall 2 3 1 3 3 3 3   Language- name 2 objects 2 2 2 2 2 2 2   Language- repeat 1 1 1  0 1 1 1   Language- follow 3 step command 3 3 3 3 3 3 3   Language- read & follow direction 1 1 1 1 1 1 1   Write a sentence 1 1 1 1 1 1 1   Copy design 1 1 1 1 1  0 1  Total score 26 27 26 27 29 27 25     Hx of erosive esophagitis and GERD. Remains on PPI therapy. Denies any heartburn or chest pain.    Past Medical History:  Diagnosis Date   Cellulitis    Chronic diastolic CHF (congestive heart failure) ([place])    a. EF initially 35-40% after MI 1/09; b. echo 7/08: EF 60%;  c. 11/2014 Echo: EF 65-70%, Gr 1 DD, mild MR.   Coronary artery disease    a.  s/p anterior STEMI 04/2007 with BMS-> LAD;  b. Cath 12/2011 patent stent;  c. low risk nuc 04/2014.   Crohn's disease ([place])    Diaphragmatic hernia without mention of obstruction or gangrene    Diverticulosis of colon (without mention of hemorrhage)    Fatty liver    a. on ultrasound of 10/2009   Flatulence, eructation, and gas pain    Fungal infection    Gait disorder    GERD (gastroesophageal reflux disease)    HOH (hard of hearing)    Hearing aids   Hyperlipidemia    Hypertension    Ischemic cardiomyopathy    a. EF initially 35-40% after MI 1/09; b. echo 7/08: EF 60%;  c. 11/2014 Echo: EF 65-70%, Gr 1 DD, mild MR.   Melanoma ([place])    Memory disorder    Orthostatic hypotension    Osteoporosis    Other chronic  nonalcoholic liver disease    Other esophagitis    Parkinson's disease ([place])    RBBB 09/02/2013   Regional enteritis of small intestine (HCC)    Renal calculi    Restless leg syndrome 03/22/2015   Ulcerative (chronic) ileocolitis (HCC)    Urinary incontinence    Past Surgical History:  Procedure Laterality Date   APPENDECTOMY     BACK SURGERY     L3, L4, L5   CARDIAC CATHETERIZATION  09/25/06   EF 35-40% but more recently 60%   CATARACT EXTRACTION     Bilateral   CHOLECYSTECTOMY     Coronary artery stent placement     Melanoma resection     Partial bowel resection     TONSILLECTOMY      No Known Allergies  Outpatient Encounter Medications as of 11/11/2019  Medication Sig   acetaminophen (TYLENOL) 325 MG tablet Take 650 mg by mouth at bedtime. And q 6 hrs prn   aspirin EC 81 MG tablet Take 81 mg by mouth daily.   atorvastatin (LIPITOR) 40 MG tablet Take 40 mg by mouth daily.   calcium carbonate (CALCIUM 600) 600 MG TABS tablet Take 600 mg by mouth daily with breakfast.   carbidopa-levodopa (SINEMET CR) 50-200 MG tablet Take 1 tablet by mouth 4 (four) times daily.   carbidopa-levodopa (SINEMET IR) 25-100 MG tablet Take 1 tablet by mouth 4 (four) times daily.   Cholecalciferol (VITAMIN D3) 50 MCG (2000 UT) TABS Take 1 tablet by mouth daily.   cholestyramine (QUESTRAN) 4 g packet TAKE 1 PACKET BY MOUTH TWICE DAILY WITH A MEAL   donepezil (ARICEPT) 10 MG tablet TAKE ONE TABLET AT BEDTIME   ipratropium (ATROVENT) 0.03 % nasal spray Place 2 sprays into both nostrils 3 (three) times daily.   Melatonin 5 MG TABS Take 1 tablet by mouth at bedtime.    midodrine (PROAMATINE) 5 MG tablet Take 1 tablet (5 mg total) by mouth 2 (two) times daily with a meal.   nitroGLYCERIN (NITROSTAT) 0.4 MG SL tablet Place 1 tablet (0.4 mg total) under the tongue every 5 (five) minutes as needed for chest pain. PT OVERDUE FOR OV PLEASE CALL FOR APPT   pantoprazole (PROTONIX) 40  MG tablet Take 40 mg by mouth daily.    potassium chloride SA (K-DUR,KLOR-CON) 20 MEQ tablet Take 40 mEq by mouth daily.   Probiotic Product (ALIGN) 4 MG CAPS Take 1 capsule by mouth daily.    sertraline (ZOLOFT) 25 MG tablet Take 25 mg by mouth at bedtime.   sodium chloride (OCEAN) 0.65 % SOLN nasal  spray Place 2 sprays into both nostrils daily as needed for congestion.    torsemide (DEMADEX) 20 MG tablet Take 1 tablet (20 mg total) by mouth daily.   vitamin B-12 (CYANOCOBALAMIN) 1000 MCG tablet Take 1,000 mcg by mouth daily.   [DISCONTINUED] chlorhexidine (PERIDEX) 0.12 % solution Use as directed 15 mLs in the mouth or throat 2 (two) times daily.   No facility-administered encounter medications on file as of 11/11/2019.    Review of Systems  Constitutional: Negative for activity change, appetite change, chills, diaphoresis, fatigue, fever and unexpected weight change.  Respiratory: Negative for cough, shortness of breath, wheezing and stridor.   Cardiovascular: Positive for leg swelling. Negative for chest pain and palpitations.  Gastrointestinal: Negative for abdominal distention, abdominal pain, constipation and diarrhea.  Genitourinary: Negative for difficulty urinating and dysuria.  Musculoskeletal: Positive for gait problem. Negative for arthralgias, back pain, joint swelling and myalgias.  Neurological: Negative for dizziness, seizures, syncope, facial asymmetry, speech difficulty, weakness and headaches.  Hematological: Negative for adenopathy. Does not bruise/bleed easily.  Psychiatric/Behavioral: Negative for agitation, behavioral problems and confusion.       Memory loss. OCD    Immunization History  Administered Date(s) Administered   Influenza, High Dose Seasonal PF 01/21/2019   Influenza-Unspecified 12/06/2014, 11/27/2017   Moderna SARS-COVID-2 Vaccination 04/13/2019, 05/11/2019   Pneumococcal Conjugate-13 05/21/2018   Pneumococcal Polysaccharide-23 05/21/2005     Tdap 06/03/2006, 10/22/2018   Zoster Recombinat (Shingrix) 10/22/2018, 12/24/2018   Pertinent  Health Maintenance Due  Topic Date Due   INFLUENZA VACCINE  11/07/2019   URINE MICROALBUMIN  12/12/2019 (Originally 01/07/1939)   PNA vac Low Risk Adult  Completed   Fall Risk  11/11/2019 07/20/2019 10/22/2018 05/12/2018 10/03/2017  Falls in the past year? 1 - 1 1 No  Number falls in past yr: 0 - 1 1 -  Injury with Fall? 1 - 1 0 -  Risk for fall due to : History of fall(s);Impaired balance/gait History of fall(s);Impaired balance/gait;Impaired mobility;Mental status change;Medication side effect History of fall(s);Impaired mobility Impaired mobility;History of fall(s);Impaired balance/gait;Mental status change;Medication side effect -  Follow up Falls evaluation completed Education provided;Falls prevention discussed;Falls evaluation completed Falls evaluation completed;Falls prevention discussed Falls evaluation completed;Falls prevention discussed;Education provided -  Comment - at [place] - - -   Functional Status Survey:    Vitals:   11/11/19 1106  Weight: 181 lb (82.1 kg)   Body mass index is 26.73 kg/m. Physical Exam Vitals and nursing note reviewed.  Constitutional:      General: He is not in acute distress.    Appearance: He is not diaphoretic.  HENT:     Head: Normocephalic and atraumatic.  Neck:     Thyroid: No thyromegaly.     Vascular: No JVD.     Trachea: No tracheal deviation.  Cardiovascular:     Rate and Rhythm: Normal rate and regular rhythm.     Heart sounds: No murmur heard.   Pulmonary:     Effort: Pulmonary effort is normal. No respiratory distress.     Breath sounds: Normal breath sounds. No wheezing.  Abdominal:     General: Bowel sounds are normal. There is no distension.     Palpations: Abdomen is soft.     Tenderness: There is no abdominal tenderness.  Musculoskeletal:     Right lower leg: Edema (+1) present.     Left lower leg: Edema (+1)  present.  Lymphadenopathy:     Cervical: No cervical adenopathy.  Skin:  General: Skin is warm and dry.  Neurological:     Mental Status: He is alert and oriented to person, place, and time.     Cranial Nerves: No cranial nerve deficit.     Labs reviewed: No results for input(s): NA, K, CL, CO2, GLUCOSE, BUN, CREATININE, CALCIUM, MG, PHOS in the last 8760 hours. No results for input(s): AST, ALT, ALKPHOS, BILITOT, PROT, ALBUMIN in the last 8760 hours. Recent Labs    08/19/19 0000 10/12/19 0000  WBC 3.1 8.1  NEUTROABS  --  5  HGB 13.5 13.6  HCT 41 40*  PLT 138* 241   Lab Results  Component Value Date   TSH 2.428 11/12/2014   No results found for: HGBA1C Lab Results  Component Value Date   CHOL 129 08/24/2018   HDL 55 08/24/2018   LDLCALC 44 08/24/2018   TRIG 153 08/24/2018    Significant Diagnostic Results in last 30 days:  No results found.  Assessment/Plan  1. Crohn's disease of small intestine without complication ([place]) No current reported issues.  Contiue Questran   2. Dementia due to Parkinson's disease without behavioral disturbance (HCC) Continue Aricept 10 mg qd and supportive care in the skilled environment Followed by Dr. Jannifer Franklin  3. Chronic diastolic CHF (congestive heart failure) (HCC) Appears compensated Continue torsemide 20 mg qd   4. Gastroesophageal reflux disease, unspecified whether esophagitis present Controlled with protonix 40 mg qd Would not taper due to prior hx of esophagitis, gerd, and hiatal hernia   5. Obsessive-compulsive disorder, unspecified type Increase Zoloft to 50 mg qhs   Family/ staff Communication: discussed with his nurse Jasmine   Labs/tests ordered:  NA

## 2019-11-11 NOTE — Patient Instructions (Signed)
[name] [name] , Thank you for taking time to come for your Medicare Wellness Visit. I appreciate your ongoing commitment to your health goals. Please review the following plan we discussed and let me know if I can assist you in the future.   Screening recommendations/referrals: Colonoscopy agedout Recommended yearly ophthalmology/optometry visit for glaucoma screening and checkup Recommended yearly dental visit for hygiene and checkup  Vaccinations: Influenza vaccine due in Oct Pneumococcal vaccine up to date Tdap vaccine up to date Shingles vaccine up to date    Advanced directives: reviewed  Conditions/risks identified: fall risk  Next appointment: 1 year  Preventive Care 84 Years and Older, Male Preventive care refers to lifestyle choices and visits with your health care provider that can promote health and wellness. What does preventive care include?  A yearly physical exam. This is also called an annual well check.  Dental exams once or twice a year.  Routine eye exams. Ask your health care provider how often you should have your eyes checked.  Personal lifestyle choices, including:  Daily care of your teeth and gums.  Regular physical activity.  Eating a healthy diet.  Avoiding tobacco and drug use.  Limiting alcohol use.  Practicing safe sex.  Taking low doses of aspirin every day.  Taking vitamin and mineral supplements as recommended by your health care provider. What happens during an annual well check? The services and screenings done by your health care provider during your annual well check will depend on your age, overall health, lifestyle risk factors, and family history of disease. Counseling  Your health care provider may ask you questions about your:  Alcohol use.  Tobacco use.  Drug use.  Emotional well-being.  Home and relationship well-being.  Sexual activity.  Eating habits.  History of falls.  Memory and ability to understand  (cognition).  Work and work Statistician. Screening  You may have the following tests or measurements:  Height, weight, and BMI.  Blood pressure.  Lipid and cholesterol levels. These may be checked every 5 years, or more frequently if you are over 84 years old.  Skin check.  Lung cancer screening. You may have this screening every year starting at age 84 if you have a 30-pack-year history of smoking and currently smoke or have quit within the past 15 years.  Fecal occult blood test (FOBT) of the stool. You may have this test every year starting at age 84.  Flexible sigmoidoscopy or colonoscopy. You may have a sigmoidoscopy every 5 years or a colonoscopy every 10 years starting at age 84.  Prostate cancer screening. Recommendations will vary depending on your family history and other risks.  Hepatitis C blood test.  Hepatitis B blood test.  Sexually transmitted disease (STD) testing.  Diabetes screening. This is done by checking your blood sugar (glucose) after you have not eaten for a while (fasting). You may have this done every 1-3 years.  Abdominal aortic aneurysm (AAA) screening. You may need this if you are a current or former smoker.  Osteoporosis. You may be screened starting at age 80 if you are at high risk. Talk with your health care provider about your test results, treatment options, and if necessary, the need for more tests. Vaccines  Your health care provider may recommend certain vaccines, such as:  Influenza vaccine. This is recommended every year.  Tetanus, diphtheria, and acellular pertussis (Tdap, Td) vaccine. You may need a Td booster every 10 years.  Zoster vaccine. You may need this after  age 84.  Pneumococcal 13-valent conjugate (PCV13) vaccine. One dose is recommended after age 84.  Pneumococcal polysaccharide (PPSV23) vaccine. One dose is recommended after age 84. Talk to your health care provider about which screenings and vaccines you need and  how often you need them. This information is not intended to replace advice given to you by your health care provider. Make sure you discuss any questions you have with your health care provider. Document Released: 04/21/2015 Document Revised: 12/13/2015 Document Reviewed: 01/24/2015 Elsevier Interactive Patient Education  2017 [place] Prevention in the Home Falls can cause injuries. They can happen to people of all ages. There are many things you can do to make your home safe and to help prevent falls. What can I do on the outside of my home?  Regularly fix the edges of walkways and driveways and fix any cracks.  Remove anything that might make you trip as you walk through a door, such as a raised step or threshold.  Trim any bushes or trees on the path to your home.  Use bright outdoor lighting.  Clear any walking paths of anything that might make someone trip, such as rocks or tools.  Regularly check to see if handrails are loose or broken. Make sure that both sides of any steps have handrails.  Any raised decks and porches should have guardrails on the edges.  Have any leaves, snow, or ice cleared regularly.  Use sand or salt on walking paths during winter.  Clean up any spills in your garage right away. This includes oil or grease spills. What can I do in the bathroom?  Use night lights.  Install grab bars by the toilet and in the tub and shower. Do not use towel bars as grab bars.  Use non-skid mats or decals in the tub or shower.  If you need to sit down in the shower, use a plastic, non-slip stool.  Keep the floor dry. Clean up any water that spills on the floor as soon as it happens.  Remove soap buildup in the tub or shower regularly.  Attach bath mats securely with double-sided non-slip rug tape.  Do not have throw rugs and other things on the floor that can make you trip. What can I do in the bedroom?  Use night lights.  Make sure that you  have a light by your bed that is easy to reach.  Do not use any sheets or blankets that are too big for your bed. They should not hang down onto the floor.  Have a firm chair that has side arms. You can use this for support while you get dressed.  Do not have throw rugs and other things on the floor that can make you trip. What can I do in the kitchen?  Clean up any spills right away.  Avoid walking on wet floors.  Keep items that you use a lot in easy-to-reach places.  If you need to reach something above you, use a strong step stool that has a grab bar.  Keep electrical cords out of the way.  Do not use floor polish or wax that makes floors slippery. If you must use wax, use non-skid floor wax.  Do not have throw rugs and other things on the floor that can make you trip. What can I do with my stairs?  Do not leave any items on the stairs.  Make sure that there are handrails on both sides of the stairs  and use them. Fix handrails that are broken or loose. Make sure that handrails are as long as the stairways.  Check any carpeting to make sure that it is firmly attached to the stairs. Fix any carpet that is loose or worn.  Avoid having throw rugs at the top or bottom of the stairs. If you do have throw rugs, attach them to the floor with carpet tape.  Make sure that you have a light switch at the top of the stairs and the bottom of the stairs. If you do not have them, ask someone to add them for you. What else can I do to help prevent falls?  Wear shoes that:  Do not have high heels.  Have rubber bottoms.  Are comfortable and fit you well.  Are closed at the toe. Do not wear sandals.  If you use a stepladder:  Make sure that it is fully opened. Do not climb a closed stepladder.  Make sure that both sides of the stepladder are locked into place.  Ask someone to hold it for you, if possible.  Clearly mark and make sure that you can see:  Any grab bars or  handrails.  First and last steps.  Where the edge of each step is.  Use tools that help you move around (mobility aids) if they are needed. These include:  Canes.  Walkers.  Scooters.  Crutches.  Turn on the lights when you go into a dark area. Replace any light bulbs as soon as they burn out.  Set up your furniture so you have a clear path. Avoid moving your furniture around.  If any of your floors are uneven, fix them.  If there are any pets around you, be aware of where they are.  Review your medicines with your doctor. Some medicines can make you feel dizzy. This can increase your chance of falling. Ask your doctor what other things that you can do to help prevent falls. This information is not intended to replace advice given to you by your health care provider. Make sure you discuss any questions you have with your health care provider. Document Released: 01/19/2009 Document Revised: 08/31/2015 Document Reviewed: 04/29/2014 Elsevier Interactive Patient Education  2017 [employer].

## 2019-11-19 DIAGNOSIS — Z9189 Other specified personal risk factors, not elsewhere classified: Secondary | ICD-10-CM | POA: Diagnosis not present

## 2019-11-26 DIAGNOSIS — Z20828 Contact with and (suspected) exposure to other viral communicable diseases: Secondary | ICD-10-CM | POA: Diagnosis not present

## 2019-11-26 DIAGNOSIS — Z9189 Other specified personal risk factors, not elsewhere classified: Secondary | ICD-10-CM | POA: Diagnosis not present

## 2019-12-14 DIAGNOSIS — Z85820 Personal history of malignant melanoma of skin: Secondary | ICD-10-CM | POA: Diagnosis not present

## 2019-12-14 DIAGNOSIS — C44722 Squamous cell carcinoma of skin of right lower limb, including hip: Secondary | ICD-10-CM | POA: Diagnosis not present

## 2019-12-14 DIAGNOSIS — Z85828 Personal history of other malignant neoplasm of skin: Secondary | ICD-10-CM | POA: Diagnosis not present

## 2019-12-14 DIAGNOSIS — D485 Neoplasm of uncertain behavior of skin: Secondary | ICD-10-CM | POA: Diagnosis not present

## 2019-12-14 DIAGNOSIS — L570 Actinic keratosis: Secondary | ICD-10-CM | POA: Diagnosis not present

## 2019-12-14 DIAGNOSIS — L905 Scar conditions and fibrosis of skin: Secondary | ICD-10-CM | POA: Diagnosis not present

## 2019-12-14 DIAGNOSIS — L820 Inflamed seborrheic keratosis: Secondary | ICD-10-CM | POA: Diagnosis not present

## 2019-12-14 DIAGNOSIS — L821 Other seborrheic keratosis: Secondary | ICD-10-CM | POA: Diagnosis not present

## 2019-12-27 ENCOUNTER — Encounter: Payer: Self-pay | Admitting: Adult Health

## 2019-12-27 ENCOUNTER — Non-Acute Institutional Stay (SKILLED_NURSING_FACILITY): Payer: PPO | Admitting: Adult Health

## 2019-12-27 DIAGNOSIS — G20 Parkinson's disease: Secondary | ICD-10-CM | POA: Diagnosis not present

## 2019-12-27 DIAGNOSIS — F429 Obsessive-compulsive disorder, unspecified: Secondary | ICD-10-CM | POA: Diagnosis not present

## 2019-12-27 DIAGNOSIS — F0281 Dementia in other diseases classified elsewhere with behavioral disturbance: Secondary | ICD-10-CM | POA: Diagnosis not present

## 2019-12-27 NOTE — Progress Notes (Signed)
Location:  Occupational psychologist of Service:  SNF (31) Provider:   Cindi Carbon, ANP [place] [phone]   Gayland Curry, DO  Patient Care Team: Gayland Curry, DO as PCP - General (Geriatric Medicine) [name], [name], MD as Consulting Physician (Cardiology) [name], MD as Attending Physician (Gastroenterology) [name], MD as Consulting Physician (Neurology) Community, [facility] ([place]) Royal Hawthorn, NP as Nurse Practitioner (Nurse Practitioner)  Extended Emergency Contact Information Primary Emergency Contact: [name] Address: [address] [facility] [address] [place] [place]          [place], [place] [address] [name] of [place] Phone: [phone] Mobile Phone: [phone] Relation: Spouse  Code Status:  DNR Goals of care: Advanced Directive information Advanced Directives 11/11/2019  Does Patient Have a Medical Advance Directive? Yes  Type of Paramedic of [place];Out of facility DNR (pink MOST or yellow form);Living will  Does patient want to make changes to medical advance directive? No - Patient declined  Copy of [place] in Chart? Yes - validated most recent copy scanned in chart (See row information)  Pre-existing out of facility DNR order (yellow form or pink MOST form) -     Chief Complaint  Patient presents with  . Acute Visit    ocd behavior    HPI:  Pt is a 86 y.o. male seen today for an acute visit for OCD behavior. [name] has a hx of dementia associated with PD. He was started on Zoloft for behaviors such as rituals surrounding his morning routine and meal time, along with obsessive cleanliness which was bothersome. The nurse reports that he has continued with this and recently saw some blood on his bedding and began cleaning himself rigorously leading to excoriation. For my interview, he is in the recliner eating a doughnut.   He denies having any feelings of anxiety or rapid thoughts. He is eating well and sleeping well stating "I am as happy as a lark"     Past Medical History:  Diagnosis Date  . Cellulitis   . Chronic diastolic CHF (congestive heart failure) (HCC)    a. EF initially 35-40% after MI 1/09; b. echo 7/08: EF 60%;  c. 11/2014 Echo: EF 65-70%, Gr 1 DD, mild MR.  . Coronary artery disease    a. s/p anterior STEMI 04/2007 with BMS-> LAD;  b. Cath 12/2011 patent stent;  c. low risk nuc 04/2014.  . Crohn's disease ([place])   . Diaphragmatic hernia without mention of obstruction or gangrene   . Diverticulosis of colon (without mention of hemorrhage)   . Fatty liver    a. on ultrasound of 10/2009  . Flatulence, eructation, and gas pain   . Fungal infection   . Gait disorder   . GERD (gastroesophageal reflux disease)   . HOH (hard of hearing)    Hearing aids  . Hyperlipidemia   . Hypertension   . Ischemic cardiomyopathy    a. EF initially 35-40% after MI 1/09; b. echo 7/08: EF 60%;  c. 11/2014 Echo: EF 65-70%, Gr 1 DD, mild MR.  . Melanoma ([place])   . Memory disorder   . Orthostatic hypotension   . Osteoporosis   . Other chronic nonalcoholic liver disease   . Other esophagitis   . Parkinson's disease ([place])   . RBBB 09/02/2013  . Regional enteritis of small intestine ([place])   . Renal calculi   . Restless leg  syndrome 03/22/2015  . Ulcerative (chronic) ileocolitis ([place])   . Urinary incontinence    Past Surgical History:  Procedure Laterality Date  . APPENDECTOMY    . BACK SURGERY     L3, L4, L5  . CARDIAC CATHETERIZATION  09/25/06   EF 35-40% but more recently 60%  . CATARACT EXTRACTION     Bilateral  . CHOLECYSTECTOMY    . Coronary artery stent placement    . Melanoma resection    . Partial bowel resection    . TONSILLECTOMY      No Known Allergies  Outpatient Encounter Medications as of 12/27/2019  Medication Sig  . acetaminophen (TYLENOL) 325 MG tablet Take 650 mg by mouth at bedtime.  And q 6 hrs prn  . aspirin EC 81 MG tablet Take 81 mg by mouth daily.  Marland Kitchen atorvastatin (LIPITOR) 40 MG tablet Take 40 mg by mouth daily.  . calcium carbonate (CALCIUM 600) 600 MG TABS tablet Take 600 mg by mouth daily with breakfast.  . carbidopa-levodopa (SINEMET CR) 50-200 MG tablet Take 1 tablet by mouth 4 (four) times daily.  . carbidopa-levodopa (SINEMET IR) 25-100 MG tablet Take 1 tablet by mouth 4 (four) times daily.  . Cholecalciferol (VITAMIN D3) 50 MCG (2000 UT) TABS Take 1 tablet by mouth daily.  . cholestyramine (QUESTRAN) 4 g packet TAKE 1 PACKET BY MOUTH TWICE DAILY WITH A MEAL  . donepezil (ARICEPT) 10 MG tablet TAKE ONE TABLET AT BEDTIME  . ipratropium (ATROVENT) 0.03 % nasal spray Place 2 sprays into both nostrils 3 (three) times daily.  . Melatonin 5 MG TABS Take 1 tablet by mouth at bedtime.   . midodrine (PROAMATINE) 5 MG tablet Take 1 tablet (5 mg total) by mouth 2 (two) times daily with a meal.  . nitroGLYCERIN (NITROSTAT) 0.4 MG SL tablet Place 1 tablet (0.4 mg total) under the tongue every 5 (five) minutes as needed for chest pain. PT OVERDUE FOR OV PLEASE CALL FOR APPT  . pantoprazole (PROTONIX) 40 MG tablet Take 40 mg by mouth daily.   . potassium chloride SA (K-DUR,KLOR-CON) 20 MEQ tablet Take 40 mEq by mouth daily.  . Probiotic Product (ALIGN) 4 MG CAPS Take 1 capsule by mouth daily.   . sertraline (ZOLOFT) 50 MG tablet Take 75 mg by mouth daily.  . sodium chloride (OCEAN) 0.65 % SOLN nasal spray Place 2 sprays into both nostrils daily as needed for congestion.   . torsemide (DEMADEX) 20 MG tablet Take 1 tablet (20 mg total) by mouth daily.  . vitamin B-12 (CYANOCOBALAMIN) 1000 MCG tablet Take 1,000 mcg by mouth daily.  . [DISCONTINUED] sertraline (ZOLOFT) 25 MG tablet Take 50 mg by mouth at bedtime.    No facility-administered encounter medications on file as of 12/27/2019.    Review of Systems  Constitutional: Negative for activity change, appetite change,  chills, diaphoresis, fatigue, fever and unexpected weight change.  Psychiatric/Behavioral: Positive for behavioral problems. Negative for agitation, confusion, decreased concentration, dysphoric mood, hallucinations, self-injury, sleep disturbance and suicidal ideas. The patient is not nervous/anxious and is not hyperactive.        Obsession with routine and cleanliness    Immunization History  Administered Date(s) Administered  . Influenza, High Dose Seasonal PF 01/21/2019  . Influenza-Unspecified 12/06/2014, 11/27/2017  . Moderna SARS-COVID-2 Vaccination 04/13/2019, 05/11/2019  . Pneumococcal Conjugate-13 05/21/2018  . Pneumococcal Polysaccharide-23 05/21/2005  . Tdap 06/03/2006, 10/22/2018  . Zoster Recombinat (Shingrix) 10/22/2018, 12/24/2018   Pertinent  Health Maintenance Due  Topic Date Due  . URINE MICROALBUMIN  Never done  . INFLUENZA VACCINE  11/07/2019  . PNA vac Low Risk Adult  Completed   Fall Risk  11/11/2019 07/20/2019 10/22/2018 05/12/2018 10/03/2017  Falls in the past year? 1 - 1 1 No  Number falls in past yr: 0 - 1 1 -  Injury with Fall? 1 - 1 0 -  Risk for fall due to : History of fall(s);Impaired balance/gait History of fall(s);Impaired balance/gait;Impaired mobility;Mental status change;Medication side effect History of fall(s);Impaired mobility Impaired mobility;History of fall(s);Impaired balance/gait;Mental status change;Medication side effect -  Follow up Falls evaluation completed Education provided;Falls prevention discussed;Falls evaluation completed Falls evaluation completed;Falls prevention discussed Falls evaluation completed;Falls prevention discussed;Education provided -  Comment - at [place] - - -   Functional Status Survey:    There were no vitals filed for this visit. There is no height or weight on file to calculate BMI. Physical Exam Vitals and nursing note reviewed.  Neurological:     General: No focal deficit present.     Mental Status: He  is alert and oriented to person, place, and time.  Psychiatric:        Mood and Affect: Mood normal.        Thought Content: Thought content normal.     Labs reviewed: Recent Labs    07/15/19 0000  NA 138  K 3.7  CL 97*  CO2 24*  BUN 16  CREATININE 0.9  CALCIUM 8.9   Recent Labs    07/15/19 0000  ALBUMIN 4.7   Recent Labs    08/19/19 0000 10/12/19 0000  WBC 3.1 8.1  NEUTROABS  --  5  HGB 13.5 13.6  HCT 41 40*  PLT 138* 241   Lab Results  Component Value Date   TSH 5.09 07/15/2019   No results found for: HGBA1C Lab Results  Component Value Date   CHOL 131 07/15/2019   HDL 63 07/15/2019   LDLCALC 44 07/15/2019   TRIG 120 07/15/2019    Significant Diagnostic Results in last 30 days:  No results found.  Assessment/Plan 1. Obsessive-compulsive disorder, unspecified type Increase Zoloft to 75 mg qd. If no improvement may change to Paxil.   2. Dementia due to Parkinson's disease with behavioral disturbance ([place]) MMSE 29/30 which is unchanged.  Continues to participate in activities and eat with friends Continues on Aricept 10 mg qd

## 2019-12-29 DIAGNOSIS — Z20828 Contact with and (suspected) exposure to other viral communicable diseases: Secondary | ICD-10-CM | POA: Diagnosis not present

## 2019-12-29 DIAGNOSIS — Z9189 Other specified personal risk factors, not elsewhere classified: Secondary | ICD-10-CM | POA: Diagnosis not present

## 2020-01-04 DIAGNOSIS — Z20828 Contact with and (suspected) exposure to other viral communicable diseases: Secondary | ICD-10-CM | POA: Diagnosis not present

## 2020-01-04 DIAGNOSIS — Z9189 Other specified personal risk factors, not elsewhere classified: Secondary | ICD-10-CM | POA: Diagnosis not present

## 2020-01-10 DIAGNOSIS — Z20828 Contact with and (suspected) exposure to other viral communicable diseases: Secondary | ICD-10-CM | POA: Diagnosis not present

## 2020-01-10 DIAGNOSIS — Z9189 Other specified personal risk factors, not elsewhere classified: Secondary | ICD-10-CM | POA: Diagnosis not present

## 2020-01-13 NOTE — Progress Notes (Deleted)
Cardiology Office Note Date:  01/13/2020  Patient ID:  [name] [name], [name] [name] Dec 02, 1928, MRN [id number] PCP:  Gayland Curry, DO  Cardiologist:  Dr. [name]   Chief Complaint: CAD  History of Present Illness: [name] [name] is a 86 y.o. male with history of CAD (s/p anterior STEMI 04/2007 with BMS-> LAD, patent by cath in 12/2011, low risk nuc 04/2014), chronic dCHF, HTN, HLD, Crohn's disease, GERD, Parkinson's disease, cellulitis, orthostatic hypotension requiring midodrine, GERD, RBBB, fatty liver, gait disorder, chronic lower extremity edema who presents for follow up.   He was seen in the ED in May 2019 with left leg cellulitis. In September 2019 he was admitted with right leg cellulitis having failed outpatient antibiotics. Dopplers negative for DVT.   He is followed by Dr Jannifer Franklin for his Parkinson's disease and memory loss.   He is seen with his wife today. He is  in skilled nursing at [facility]. He is doing really well. He is very active in things he can do in his wheelchair.  Denies any chest pain, dyspnea,  fever.  Weight is stable at [facility]. He does note some LE edema. Wears support hose. Does eat a fair amount of salt.    Past Medical History:  Diagnosis Date  . Cellulitis   . Chronic diastolic CHF (congestive heart failure) (HCC)    a. EF initially 35-40% after MI 1/09; b. echo 7/08: EF 60%;  c. 11/2014 Echo: EF 65-70%, Gr 1 DD, mild MR.  . Coronary artery disease    a. s/p anterior STEMI 04/2007 with BMS-> LAD;  b. Cath 12/2011 patent stent;  c. low risk nuc 04/2014.  . Crohn's disease ([place])   . Diaphragmatic hernia without mention of obstruction or gangrene   . Diverticulosis of colon (without mention of hemorrhage)   . Fatty liver    a. on ultrasound of 10/2009  . Flatulence, eructation, and gas pain   . Fungal infection   . Gait disorder   . GERD (gastroesophageal reflux disease)   . HOH (hard of hearing)    Hearing aids  . Hyperlipidemia   . Hypertension   .  Ischemic cardiomyopathy    a. EF initially 35-40% after MI 1/09; b. echo 7/08: EF 60%;  c. 11/2014 Echo: EF 65-70%, Gr 1 DD, mild MR.  . Melanoma ([place])   . Memory disorder   . Orthostatic hypotension   . Osteoporosis   . Other chronic nonalcoholic liver disease   . Other esophagitis   . Parkinson's disease ([place])   . RBBB 09/02/2013  . Regional enteritis of small intestine ([place])   . Renal calculi   . Restless leg syndrome 03/22/2015  . 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 Urinary incontinence     Past Surgical History:  Procedure Laterality Date  . APPENDECTOMY    . BACK SURGERY     L3, L4, L5  . CARDIAC CATHETERIZATION  09/25/06   EF 35-40% but more recently 60%  . CATARACT EXTRACTION     Bilateral  . CHOLECYSTECTOMY    . Coronary artery stent placement    . Melanoma resection    . Partial bowel resection    . TONSILLECTOMY      Current Outpatient Medications  Medication Sig Dispense Refill  . acetaminophen (TYLENOL) 325 MG tablet Take 650 mg by mouth at bedtime. And q 6 hrs prn    . aspirin EC 81 MG tablet Take 81 mg by mouth daily.    Marland Kitchen  atorvastatin (LIPITOR) 40 MG tablet Take 40 mg by mouth daily.    . calcium carbonate (CALCIUM 600) 600 MG TABS tablet Take 600 mg by mouth daily with breakfast.    . carbidopa-levodopa (SINEMET CR) 50-200 MG tablet Take 1 tablet by mouth 4 (four) times daily. 120 tablet 5  . carbidopa-levodopa (SINEMET IR) 25-100 MG tablet Take 1 tablet by mouth 4 (four) times daily.    . Cholecalciferol (VITAMIN D3) 50 MCG (2000 UT) TABS Take 1 tablet by mouth daily.    . cholestyramine (QUESTRAN) 4 g packet TAKE 1 PACKET BY MOUTH TWICE DAILY WITH A MEAL 60 each 11  . donepezil (ARICEPT) 10 MG tablet TAKE ONE TABLET AT BEDTIME 90 tablet 0  . ipratropium (ATROVENT) 0.03 % nasal spray Place 2 sprays into both nostrils 3 (three) times daily.    . Melatonin 5 MG TABS Take 1 tablet by mouth at bedtime.     . midodrine (PROAMATINE) 5 MG tablet Take 1  tablet (5 mg total) by mouth 2 (two) times daily with a meal. 60 tablet 9  . nitroGLYCERIN (NITROSTAT) 0.4 MG SL tablet Place 1 tablet (0.4 mg total) under the tongue every 5 (five) minutes as needed for chest pain. PT OVERDUE FOR OV PLEASE CALL FOR APPT 25 tablet 0  . pantoprazole (PROTONIX) 40 MG tablet Take 40 mg by mouth daily.     . potassium chloride SA (K-DUR,KLOR-CON) 20 MEQ tablet Take 40 mEq by mouth daily.    . Probiotic Product (ALIGN) 4 MG CAPS Take 1 capsule by mouth daily.     . sertraline (ZOLOFT) 50 MG tablet Take 75 mg by mouth daily.    . sodium chloride (OCEAN) 0.65 % SOLN nasal spray Place 2 sprays into both nostrils daily as needed for congestion.     . torsemide (DEMADEX) 20 MG tablet Take 1 tablet (20 mg total) by mouth daily. 180 tablet 3  . vitamin B-12 (CYANOCOBALAMIN) 1000 MCG tablet Take 1,000 mcg by mouth daily.     No current facility-administered medications for this visit.    Allergies:   Patient has no known allergies.   Social History:  The patient  reports that he has never smoked. He has never used smokeless tobacco. He reports current alcohol use of about 2.0 - 3.0 standard drinks of alcohol per week. He reports that he does not use drugs.   Family History:  The patient's family history includes Colon cancer in his brother; Coronary artery disease in an other family member; Emphysema in his father; Heart attack in his mother; Heart disease in his brother and brother; Heart failure in his mother; Hypertension in his mother.  ROS:  Please see the history of present illness.   All other systems are reviewed and otherwise negative.   PHYSICAL EXAM:  VS:  There were no vitals taken for this visit. BMI: There is no height or weight on file to calculate BMI. GENERAL:  Well appearing, elderly WM in NAD, seen in a wheelchair.  HEENT:  PERRL, EOMI, sclera are clear. Oropharynx is clear. NECK:  No jugular venous distention, carotid upstroke brisk and symmetric, no  bruits, no thyromegaly or adenopathy LUNGS:  Clear to auscultation bilaterally CHEST:  Unremarkable HEART:  RRR,  PMI not displaced or sustained,S1 and S2 within normal limits, no S3, no S4: no clicks, no rubs, no murmurs ABD:  Soft, nontender. Distended. BS +, no masses or bruits. No hepatomegaly, no splenomegaly EXT:  2 +  pulses throughout, 1-2+ RLE  Edema, trace on right., no cyanosis no clubbing SKIN:  Warm and dry.  No rashes NEURO:  Alert and oriented x 3. Cranial nerves II through XII intact. PSYCH:  Cognitively intact  EKG:  Is not done today.     Recent Labs: 07/15/2019: BUN 16; Creatinine 0.9; Potassium 3.7; Sodium 138; TSH 5.09 10/12/2019: Hemoglobin 13.6; Platelets 241  07/15/2019: Cholesterol 131; HDL 63; LDL Cholesterol 44; Triglycerides 120   CrCl cannot be calculated (Patient's most recent lab result is older than the maximum 21 days allowed.).    Wt Readings from Last 3 Encounters:  11/11/19 181 lb (82.1 kg)  11/11/19 181 lb 12.8 oz (82.5 kg)  09/09/19 180 lb (81.6 kg)     Other studies reviewed: Additional studies/records reviewed today include:  Echo 11/14/15: Study Conclusions  - Left ventricle: The cavity size was normal. There was mild focal   basal and mild concentric hypertrophy of the septum. Systolic   function was vigorous. The estimated ejection fraction was in the   range of 65% to 70%. Wall motion was normal; there were no   regional wall motion abnormalities. Doppler parameters are   consistent with abnormal left ventricular relaxation (grade 1   diastolic dysfunction). - Mitral valve: There was mild regurgitation.  Impressions:  - Compared to the prior study, there has been no significant   interval change.  ASSESSMENT AND PLAN:  1. Chronic diastolic CHF -well compensated. He does have mild edema R>L, weight is stable. Recommend continuing torsemide 20 mg daily. Restrict salt intake more. Continue compression hose. Keep feet elevated when  possible. If swelling worsens may need to increase diuretics for a few days. Will update labs including CBC, CMET, TSH and lipids.  2. History of essential HTN with orthostatic hypotension. BP well controlled on current regimen and he has no orthostatic symptoms.  3. CAD s/p anterior MI 2009 with BMS to LAD. Cath 2013 showed patent stent. Normal Myoview Jan 2016.  Asymptomatic. 4.   Parkinson's disease   Disposition: F/u in 6 months  Current medicines are reviewed at length with the patient today.  The patient did not have any concerns regarding medicines.  Signed, Deryl Ports [name] MD, [facility]    01/13/2020 12:20 PM     [facility]

## 2020-01-17 DIAGNOSIS — Z9189 Other specified personal risk factors, not elsewhere classified: Secondary | ICD-10-CM | POA: Diagnosis not present

## 2020-01-17 DIAGNOSIS — Z20828 Contact with and (suspected) exposure to other viral communicable diseases: Secondary | ICD-10-CM | POA: Diagnosis not present

## 2020-01-20 ENCOUNTER — Ambulatory Visit: Payer: PPO | Admitting: Cardiology

## 2020-01-20 DIAGNOSIS — F605 Obsessive-compulsive personality disorder: Secondary | ICD-10-CM | POA: Diagnosis not present

## 2020-01-21 ENCOUNTER — Ambulatory Visit: Payer: PPO | Admitting: Cardiology

## 2020-01-27 ENCOUNTER — Non-Acute Institutional Stay (SKILLED_NURSING_FACILITY): Payer: PPO | Admitting: Adult Health

## 2020-01-27 DIAGNOSIS — K5000 Crohn's disease of small intestine without complications: Secondary | ICD-10-CM

## 2020-01-27 DIAGNOSIS — F422 Mixed obsessional thoughts and acts: Secondary | ICD-10-CM

## 2020-01-27 DIAGNOSIS — I5032 Chronic diastolic (congestive) heart failure: Secondary | ICD-10-CM

## 2020-01-27 DIAGNOSIS — F0280 Dementia in other diseases classified elsewhere without behavioral disturbance: Secondary | ICD-10-CM | POA: Diagnosis not present

## 2020-01-27 DIAGNOSIS — I10 Essential (primary) hypertension: Secondary | ICD-10-CM

## 2020-01-27 DIAGNOSIS — G20 Parkinson's disease: Secondary | ICD-10-CM

## 2020-01-28 ENCOUNTER — Encounter: Payer: Self-pay | Admitting: Adult Health

## 2020-01-28 NOTE — Progress Notes (Signed)
Location:  Occupational psychologist of Service:  SNF (31) Provider:   Cindi Carbon, ANP [employer] [phone]   Gayland Curry, DO  Patient Care Team: Gayland Curry, DO as PCP - General (Geriatric Medicine) [name], [name], MD as Consulting Physician (Cardiology) [name], MD as Attending Physician (Gastroenterology) [name], MD as Consulting Physician (Neurology) Community, [facility] ([place]) Royal Hawthorn, NP as Nurse Practitioner (Nurse Practitioner)  Extended Emergency Contact Information Primary Emergency Contact: [name] Address: [address] [place] [place]          [place], [place] [address] [name] of [place] Phone: [phone] Mobile Phone: [phone] Relation: Spouse  Code Status:  DNR Goals of care: Advanced Directive information Advanced Directives 11/11/2019  Does Patient Have a Medical Advance Directive? Yes  Type of Paramedic of [place];Out of facility DNR (pink MOST or yellow form);Living will  Does patient want to make changes to medical advance directive? No - Patient declined  Copy of [place] in Chart? Yes - validated most recent copy scanned in chart (See row information)  Pre-existing out of facility DNR order (yellow form or pink MOST form) -     Chief Complaint  Patient presents with  . Medical Management of Chronic Issues    HPI:  Pt is a 86 y.o. male seen today for medical management of chronic diseases.    Mr. Jenkinson continues with some obsessive thoughts and acts regarding keeping himself clean. He is scrubbing his arms causing excoriation. He lines up cups while he eats and has a specific routine while eating. He has specific morning routine that takes a long time and involves rituals. He is not counting. No delusions. Seen by Dr. Casimiro Needle and Zoloft increased to 200 mg qd on 10/14, awaiting  improvement  He has a motorized wheelchair and has an order for OT to eval and treat safe usage  No reported issues are noted with chest pain, sob, weight gain or increased edema  He continues on Questran for Crohn's. No issues today of diarrhea or abd pain.     Past Medical History:  Diagnosis Date  . Cellulitis   . Chronic diastolic CHF (congestive heart failure) (HCC)    a. EF initially 35-40% after MI 1/09; b. echo 7/08: EF 60%;  c. 11/2014 Echo: EF 65-70%, Gr 1 DD, mild MR.  . Coronary artery disease    a. s/p anterior STEMI 04/2007 with BMS-> LAD;  b. Cath 12/2011 patent stent;  c. low risk nuc 04/2014.  . Crohn's disease ([place])   . Diaphragmatic hernia without mention of obstruction or gangrene   . Diverticulosis of colon (without mention of hemorrhage)   . Fatty liver    a. on ultrasound of 10/2009  . Flatulence, eructation, and gas pain   . Fungal infection   . Gait disorder   . GERD (gastroesophageal reflux disease)   . HOH (hard of hearing)    Hearing aids  . Hyperlipidemia   . Hypertension   . Ischemic cardiomyopathy    a. EF initially 35-40% after MI 1/09; b. echo 7/08: EF 60%;  c. 11/2014 Echo: EF 65-70%, Gr 1 DD, mild MR.  . Melanoma ([place])   . Memory disorder   . Orthostatic hypotension   . Osteoporosis   . Other chronic nonalcoholic liver disease   . Other esophagitis   . Parkinson's disease ([place])   .  RBBB 09/02/2013  . Regional enteritis of small intestine ([place])   . Renal calculi   . Restless leg syndrome 03/22/2015  . 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 Urinary incontinence    Past Surgical History:  Procedure Laterality Date  . APPENDECTOMY    . BACK SURGERY     L3, L4, L5  . CARDIAC CATHETERIZATION  09/25/06   EF 35-40% but more recently 60%  . CATARACT EXTRACTION     Bilateral  . CHOLECYSTECTOMY    . Coronary artery stent placement    . Melanoma resection    . Partial bowel resection    . TONSILLECTOMY      No Known  Allergies  Outpatient Encounter Medications as of 01/27/2020  Medication Sig  . acetaminophen (TYLENOL) 325 MG tablet Take 650 mg by mouth at bedtime. And q 6 hrs prn  . aspirin EC 81 MG tablet Take 81 mg by mouth daily.  Marland Kitchen atorvastatin (LIPITOR) 40 MG tablet Take 40 mg by mouth daily.  . calcium carbonate (CALCIUM 600) 600 MG TABS tablet Take 600 mg by mouth daily with breakfast.  . carbidopa-levodopa (SINEMET CR) 50-200 MG tablet Take 1 tablet by mouth 4 (four) times daily.  . carbidopa-levodopa (SINEMET IR) 25-100 MG tablet Take 1 tablet by mouth 4 (four) times daily.  . Cholecalciferol (VITAMIN D3) 50 MCG (2000 UT) TABS Take 1 tablet by mouth daily.  . cholestyramine (QUESTRAN) 4 g packet TAKE 1 PACKET BY MOUTH TWICE DAILY WITH A MEAL  . donepezil (ARICEPT) 10 MG tablet TAKE ONE TABLET AT BEDTIME  . ipratropium (ATROVENT) 0.03 % nasal spray Place 2 sprays into both nostrils 3 (three) times daily.  . Melatonin 5 MG TABS Take 1 tablet by mouth at bedtime.   . midodrine (PROAMATINE) 5 MG tablet Take 1 tablet (5 mg total) by mouth 2 (two) times daily with a meal.  . nitroGLYCERIN (NITROSTAT) 0.4 MG SL tablet Place 1 tablet (0.4 mg total) under the tongue every 5 (five) minutes as needed for chest pain. PT OVERDUE FOR OV PLEASE CALL FOR APPT  . pantoprazole (PROTONIX) 40 MG tablet Take 40 mg by mouth daily.   . potassium chloride SA (K-DUR,KLOR-CON) 20 MEQ tablet Take 40 mEq by mouth daily.  . Probiotic Product (ALIGN) 4 MG CAPS Take 1 capsule by mouth daily.   . sertraline (ZOLOFT) 100 MG tablet Take 200 mg by mouth daily.  . sodium chloride (OCEAN) 0.65 % SOLN nasal spray Place 2 sprays into both nostrils daily as needed for congestion.   . torsemide (DEMADEX) 20 MG tablet Take 1 tablet (20 mg total) by mouth daily.  . vitamin B-12 (CYANOCOBALAMIN) 1000 MCG tablet Take 1,000 mcg by mouth daily.  . [DISCONTINUED] sertraline (ZOLOFT) 50 MG tablet Take 75 mg by mouth daily.   No  facility-administered encounter medications on file as of 01/27/2020.    Review of Systems  Constitutional: Negative for activity change, appetite change, chills, diaphoresis, fatigue, fever and unexpected weight change.  HENT: Negative for congestion, sore throat and trouble swallowing.   Respiratory: Negative for cough, shortness of breath, wheezing and stridor.   Cardiovascular: Positive for leg swelling. Negative for chest pain and palpitations.  Gastrointestinal: Negative for abdominal distention, abdominal pain, constipation and diarrhea.  Genitourinary: Negative for difficulty urinating and dysuria.  Musculoskeletal: Positive for gait problem. Negative for arthralgias, back pain, joint swelling and myalgias.  Skin: Positive for rash.  Neurological: Negative for dizziness, tremors, seizures, syncope, facial  asymmetry, speech difficulty, weakness and headaches.  Hematological: Negative for adenopathy. Does not bruise/bleed easily.  Psychiatric/Behavioral: Positive for behavioral problems and confusion. Negative for agitation, hallucinations and self-injury. The patient is not nervous/anxious and is not hyperactive.     Immunization History  Administered Date(s) Administered  . Influenza, High Dose Seasonal PF 01/21/2019  . Influenza-Unspecified 12/06/2014, 11/27/2017  . Moderna SARS-COVID-2 Vaccination 04/13/2019, 05/11/2019  . Pneumococcal Conjugate-13 05/21/2018  . Pneumococcal Polysaccharide-23 05/21/2005  . Tdap 06/03/2006, 10/22/2018  . Zoster Recombinat (Shingrix) 10/22/2018, 12/24/2018   Pertinent  Health Maintenance Due  Topic Date Due  . INFLUENZA VACCINE  11/07/2019  . URINE MICROALBUMIN  02/28/2020 (Originally 01/07/1939)  . PNA vac Low Risk Adult  Completed   Fall Risk  11/11/2019 07/20/2019 10/22/2018 05/12/2018 10/03/2017  Falls in the past year? 1 - 1 1 No  Number falls in past yr: 0 - 1 1 -  Injury with Fall? 1 - 1 0 -  Risk for fall due to : History of  fall(s);Impaired balance/gait History of fall(s);Impaired balance/gait;Impaired mobility;Mental status change;Medication side effect History of fall(s);Impaired mobility Impaired mobility;History of fall(s);Impaired balance/gait;Mental status change;Medication side effect -  Follow up Falls evaluation completed Education provided;Falls prevention discussed;Falls evaluation completed Falls evaluation completed;Falls prevention discussed Falls evaluation completed;Falls prevention discussed;Education provided -  Comment - at [place] - - -   Functional Status Survey:    Vitals:   01/28/20 1209  Weight: 174 lb 14.4 oz (79.3 kg)   Body mass index is 25.83 kg/m. Physical Exam Vitals and nursing note reviewed.  Constitutional:      General: He is not in acute distress.    Appearance: He is not diaphoretic.  HENT:     Head: Normocephalic and atraumatic.  Neck:     Thyroid: No thyromegaly.     Vascular: No JVD.     Trachea: No tracheal deviation.  Cardiovascular:     Rate and Rhythm: Normal rate and regular rhythm.     Heart sounds: No murmur heard.   Pulmonary:     Effort: Pulmonary effort is normal. No respiratory distress.     Breath sounds: Normal breath sounds. No wheezing.  Abdominal:     General: Bowel sounds are normal. There is no distension.     Palpations: Abdomen is soft.     Tenderness: There is no abdominal tenderness.     Comments: rotund  Musculoskeletal:     Right lower leg: Edema present.     Left lower leg: Edema present.     Comments: +1 BLE edema   Lymphadenopathy:     Cervical: No cervical adenopathy.  Skin:    General: Skin is warm and dry.     Findings: Erythema (Bilateral arms with excoriation ) present.  Neurological:     Mental Status: He is alert and oriented to person, place, and time.     Cranial Nerves: No cranial nerve deficit.     Comments: Rubbing his arms with a washcloth when I walked in     Labs reviewed: Recent Labs     07/15/19 0000  NA 138  K 3.7  CL 97*  CO2 24*  BUN 16  CREATININE 0.9  CALCIUM 8.9   Recent Labs    07/15/19 0000  ALBUMIN 4.7   Recent Labs    08/19/19 0000 10/12/19 0000  WBC 3.1 8.1  NEUTROABS  --  5  HGB 13.5 13.6  HCT 41 40*  PLT 138* 241   Lab  Results  Component Value Date   TSH 5.09 07/15/2019   No results found for: HGBA1C Lab Results  Component Value Date   CHOL 131 07/15/2019   HDL 63 07/15/2019   LDLCALC 44 07/15/2019   TRIG 120 07/15/2019    Significant Diagnostic Results in last 30 days:  No results found.  Assessment/Plan 1. Dementia due to Parkinson's disease without behavioral disturbance [facility]) MMSE - Mini Mental State Exam 11/11/2019 09/30/2019 05/24/2019 12/16/2018 10/22/2018 02/20/2018 10/03/2017  Not completed: - - (No Data) - - - -  Orientation to time 5 5 4 3 4 4 4   Orientation to Place 4 5 4 5 5 5 5   Registration 3 3 3 3 3 3 3   Attention/ Calculation 3 2 5 5 5 4 1   Recall 2 3 1 3 3 3 3   Language- name 2 objects 2 2 2 2 2 2 2   Language- repeat 1 1 1  0 1 1 1   Language- follow 3 step command 3 3 3 3 3 3 3   Language- read & follow direction 1 1 1 1 1 1 1   Write a sentence 1 1 1 1 1 1 1   Copy design 1 1 1 1 1  0 1  Total score 26 27 26 27 29 27 25    Followed by neurology. Continue Aricept and Sinemet.  2. Mixed obsessional thoughts and acts He continues with obsession over cleaning. This is a difficult symptom to treat. He has been on the increased Zoloft dosage for 1 week. Would give it at least 1 month to see improvement. Dr. Casimiro Needle will follow up and may try Luvox. I recommended long sleeves shirts to prevent excoriation but he was not interested. He did not have insight into his problem.   3. Crohn's disease of small intestine without complication ([place]) Doing well with Questran. I instructed the nurses to make sure the Lucrezia Starch is given separate from other meds such as the zoloft to prevent malabsorption  4. Chronic diastolic CHF  (congestive heart failure) (HCC) Compensated.  Continue Torsemide 20 mg qd   5. Essential hypertension Controlled. Takes midodrine for orthostatic hypotension.     Family/ staff Communication: nurse  Labs/tests ordered:  NA

## 2020-02-02 DIAGNOSIS — G20 Parkinson's disease: Secondary | ICD-10-CM | POA: Diagnosis not present

## 2020-02-02 DIAGNOSIS — R293 Abnormal posture: Secondary | ICD-10-CM | POA: Diagnosis not present

## 2020-02-02 DIAGNOSIS — M6389 Disorders of muscle in diseases classified elsewhere, multiple sites: Secondary | ICD-10-CM | POA: Diagnosis not present

## 2020-02-03 DIAGNOSIS — G20 Parkinson's disease: Secondary | ICD-10-CM | POA: Diagnosis not present

## 2020-02-03 DIAGNOSIS — R293 Abnormal posture: Secondary | ICD-10-CM | POA: Diagnosis not present

## 2020-02-03 DIAGNOSIS — M6389 Disorders of muscle in diseases classified elsewhere, multiple sites: Secondary | ICD-10-CM | POA: Diagnosis not present

## 2020-02-04 DIAGNOSIS — M6389 Disorders of muscle in diseases classified elsewhere, multiple sites: Secondary | ICD-10-CM | POA: Diagnosis not present

## 2020-02-04 DIAGNOSIS — R293 Abnormal posture: Secondary | ICD-10-CM | POA: Diagnosis not present

## 2020-02-04 DIAGNOSIS — G20 Parkinson's disease: Secondary | ICD-10-CM | POA: Diagnosis not present

## 2020-02-07 DIAGNOSIS — R293 Abnormal posture: Secondary | ICD-10-CM | POA: Diagnosis not present

## 2020-02-07 DIAGNOSIS — G20 Parkinson's disease: Secondary | ICD-10-CM | POA: Diagnosis not present

## 2020-02-07 DIAGNOSIS — M6389 Disorders of muscle in diseases classified elsewhere, multiple sites: Secondary | ICD-10-CM | POA: Diagnosis not present

## 2020-02-09 DIAGNOSIS — R293 Abnormal posture: Secondary | ICD-10-CM | POA: Diagnosis not present

## 2020-02-09 DIAGNOSIS — G20 Parkinson's disease: Secondary | ICD-10-CM | POA: Diagnosis not present

## 2020-02-09 DIAGNOSIS — M6389 Disorders of muscle in diseases classified elsewhere, multiple sites: Secondary | ICD-10-CM | POA: Diagnosis not present

## 2020-02-10 DIAGNOSIS — G20 Parkinson's disease: Secondary | ICD-10-CM | POA: Diagnosis not present

## 2020-02-10 DIAGNOSIS — M6389 Disorders of muscle in diseases classified elsewhere, multiple sites: Secondary | ICD-10-CM | POA: Diagnosis not present

## 2020-02-10 DIAGNOSIS — R293 Abnormal posture: Secondary | ICD-10-CM | POA: Diagnosis not present

## 2020-02-11 DIAGNOSIS — G20 Parkinson's disease: Secondary | ICD-10-CM | POA: Diagnosis not present

## 2020-02-11 DIAGNOSIS — M6389 Disorders of muscle in diseases classified elsewhere, multiple sites: Secondary | ICD-10-CM | POA: Diagnosis not present

## 2020-02-11 DIAGNOSIS — R293 Abnormal posture: Secondary | ICD-10-CM | POA: Diagnosis not present

## 2020-02-14 DIAGNOSIS — M6389 Disorders of muscle in diseases classified elsewhere, multiple sites: Secondary | ICD-10-CM | POA: Diagnosis not present

## 2020-02-14 DIAGNOSIS — R293 Abnormal posture: Secondary | ICD-10-CM | POA: Diagnosis not present

## 2020-02-14 DIAGNOSIS — G20 Parkinson's disease: Secondary | ICD-10-CM | POA: Diagnosis not present

## 2020-02-15 DIAGNOSIS — G20 Parkinson's disease: Secondary | ICD-10-CM | POA: Diagnosis not present

## 2020-02-15 DIAGNOSIS — M6389 Disorders of muscle in diseases classified elsewhere, multiple sites: Secondary | ICD-10-CM | POA: Diagnosis not present

## 2020-02-15 DIAGNOSIS — R293 Abnormal posture: Secondary | ICD-10-CM | POA: Diagnosis not present

## 2020-02-16 DIAGNOSIS — R293 Abnormal posture: Secondary | ICD-10-CM | POA: Diagnosis not present

## 2020-02-16 DIAGNOSIS — M6389 Disorders of muscle in diseases classified elsewhere, multiple sites: Secondary | ICD-10-CM | POA: Diagnosis not present

## 2020-02-16 DIAGNOSIS — G20 Parkinson's disease: Secondary | ICD-10-CM | POA: Diagnosis not present

## 2020-02-21 DIAGNOSIS — G20 Parkinson's disease: Secondary | ICD-10-CM | POA: Diagnosis not present

## 2020-02-21 DIAGNOSIS — R293 Abnormal posture: Secondary | ICD-10-CM | POA: Diagnosis not present

## 2020-02-21 DIAGNOSIS — M6389 Disorders of muscle in diseases classified elsewhere, multiple sites: Secondary | ICD-10-CM | POA: Diagnosis not present

## 2020-02-21 NOTE — Progress Notes (Signed)
Cardiology Clinic Note   Patient Name: [name] [name] Date of Encounter: 02/24/2020  Primary Care Provider:  Gayland Curry, DO Primary Cardiologist:  Peter [name], MD  Patient Profile    [name] Pigeon. [name] 86 year old male presents to the clinic today for follow-up evaluation of his coronary artery disease and HTN.  Past Medical History    Past Medical History:  Diagnosis Date  . Cellulitis   . Chronic diastolic CHF (congestive heart failure) (HCC)    a. EF initially 35-40% after MI 1/09; b. echo 7/08: EF 60%;  c. 11/2014 Echo: EF 65-70%, Gr 1 DD, mild MR.  . Coronary artery disease    a. s/p anterior STEMI 04/2007 with BMS-> LAD;  b. Cath 12/2011 patent stent;  c. low risk nuc 04/2014.  . Crohn's disease ([place])   . Diaphragmatic hernia without mention of obstruction or gangrene   . Diverticulosis of colon (without mention of hemorrhage)   . Fatty liver    a. on ultrasound of 10/2009  . Flatulence, eructation, and gas pain   . Fungal infection   . Gait disorder   . GERD (gastroesophageal reflux disease)   . HOH (hard of hearing)    Hearing aids  . Hyperlipidemia   . Hypertension   . Ischemic cardiomyopathy    a. EF initially 35-40% after MI 1/09; b. echo 7/08: EF 60%;  c. 11/2014 Echo: EF 65-70%, Gr 1 DD, mild MR.  . Melanoma ([place])   . Memory disorder   . Orthostatic hypotension   . Osteoporosis   . Other chronic nonalcoholic liver disease   . Other esophagitis   . Parkinson's disease ([place])   . RBBB 09/02/2013  . Regional enteritis of small intestine ([place])   . Renal calculi   . Restless leg syndrome 03/22/2015  . 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 Urinary incontinence    Past Surgical History:  Procedure Laterality Date  . APPENDECTOMY    . BACK SURGERY     L3, L4, L5  . CARDIAC CATHETERIZATION  09/25/06   EF 35-40% but more recently 60%  . CATARACT EXTRACTION     Bilateral  . CHOLECYSTECTOMY    . Coronary artery stent placement    . Melanoma resection     . Partial bowel resection    . TONSILLECTOMY      Allergies  No Known Allergies  History of Present Illness    [name] [name] has a PMH of hypertension, hyperlipidemia, Crohn's disease, GERD, Parkinson's disease, cellulitis, orthostatic hypotension, GERD, RBBB, fatty liver, gait disorder, chronic lower extremity edema, and CAD (status post anterior STEMI 1/09, BMS to the LAD, which was patent by cath 9/13, had low risk nuclear stress test 1/16).  He also has chronic diastolic CHF.  He was evaluated in the emergency department 5/19 with left leg cellulitis.  In September 2019 he was admitted with right leg cellulitis due to failed outpatient antibiotic treatment.  Lower extremity Dopplers were negative at that time for DVT.  He follows with Dr. Jannifer Franklin for his Parkinson's and memory care.  He was last seen by Dr. [name] on 07/13/2019.  He was accompanied by his wife during their office visit.  He was  a resident of [facility].  He was doing well during the time of the visit.  He was wheelchair-bound but was very active participating in activities that she was able to do while seated.  He denied chest pain, dyspnea, and fever.  His weight has  been stable.  He was noted to have some lower extremity edema.  He reported compliance with lower extremity support stockings.  He reported some dietary indiscretion with regards to sodium.  He presents the clinic today for follow-up evaluation states he feels well.  He stays very active doing wheelchair activities at [place].  His wife states that she has a hard time keeping up with him or getting a hold of him due to all of his activities.  He states that he is eating well and in good spirits.  His wife indicates that he continues to be much more active than he was prior to moving out of their apartment.  Parkinson's seems to be stable.  He is able to answer questions well and hold any conversation asking appropriate insightful questions.  I will have him  continue his physical activity, give him salty 6 diet sheet, and have him follow-up in 6 months.  Today he denies chest pain, shortness of breath, lower extremity edema, fatigue, palpitations, melena, hematuria, hemoptysis, diaphoresis, weakness, presyncope, syncope, orthopnea, and PND.   Home Medications    Prior to Admission medications   Medication Sig Start Date End Date Taking? Authorizing Provider  acetaminophen (TYLENOL) 325 MG tablet Take 650 mg by mouth at bedtime. And q 6 hrs prn    [provider]  aspirin EC 81 MG tablet Take 81 mg by mouth daily.    [provider]  atorvastatin (LIPITOR) 40 MG tablet Take 40 mg by mouth daily.    [provider]  calcium carbonate (CALCIUM 600) 600 MG TABS tablet Take 600 mg by mouth daily with breakfast.    [provider]  carbidopa-levodopa (SINEMET CR) 50-200 MG tablet Take 1 tablet by mouth 4 (four) times daily. 10/03/17   [name], MD  carbidopa-levodopa (SINEMET IR) 25-100 MG tablet Take 1 tablet by mouth 4 (four) times daily.    [provider]  Cholecalciferol (VITAMIN D3) 50 MCG (2000 UT) TABS Take 1 tablet by mouth daily.    [provider]  cholestyramine (QUESTRAN) 4 g packet TAKE 1 PACKET BY MOUTH TWICE DAILY WITH A MEAL 01/16/17   [name], MD  donepezil (ARICEPT) 10 MG tablet TAKE ONE TABLET AT BEDTIME 12/10/17   [name], MD  ipratropium (ATROVENT) 0.03 % nasal spray Place 2 sprays into both nostrils 3 (three) times daily.    [provider]  Melatonin 5 MG TABS Take 1 tablet by mouth at bedtime.     [provider]  midodrine (PROAMATINE) 5 MG tablet Take 1 tablet (5 mg total) by mouth 2 (two) times daily with a meal. 11/23/14   Dunn, Dayna N, PA-C  nitroGLYCERIN (NITROSTAT) 0.4 MG SL tablet Place 1 tablet (0.4 mg total) under the tongue every 5 (five) minutes as needed for chest pain. PT OVERDUE FOR OV PLEASE CALL FOR APPT 07/07/17    [name], [name], MD  pantoprazole (PROTONIX) 40 MG tablet Take 40 mg by mouth daily.  12/30/14   [provider]  potassium chloride SA (K-DUR,KLOR-CON) 20 MEQ tablet Take 40 mEq by mouth daily.    [provider]  Probiotic Product (ALIGN) 4 MG CAPS Take 1 capsule by mouth daily.     [provider]  sertraline (ZOLOFT) 100 MG tablet Take 200 mg by mouth daily.    [provider]  sodium chloride (OCEAN) 0.65 % SOLN nasal spray Place 2 sprays into both nostrils daily as  needed for congestion.     [provider]  torsemide (DEMADEX) 20 MG tablet Take 1 tablet (20 mg total) by mouth daily. 11/23/14   Dunn, [name], PA-C  vitamin B-12 (CYANOCOBALAMIN) 1000 MCG tablet Take 1,000 mcg by mouth daily.    [provider]    Family History    Family History  Problem Relation Age of Onset  . Heart failure Mother   . Heart attack Mother   . Hypertension Mother   . Emphysema Father   . Heart disease Brother   . Heart disease Brother   . Coronary artery disease Other   . Colon cancer Brother        older at dx  . Stomach cancer Neg Hx    He indicated that his mother is deceased. He indicated that his father is deceased. He indicated that all of his three brothers are deceased. He indicated that his maternal grandmother is deceased. He indicated that his maternal grandfather is deceased. He indicated that his paternal grandmother is deceased. He indicated that his paternal grandfather is deceased. He indicated that the status of his neg hx is unknown. He indicated that the status of his other is unknown.  Social History    Social History   Socioeconomic History  . Marital status: Married    Spouse name: [name]  . Number of children: 3  . Years of education: 16  . Highest education level: Bachelor's degree (e.g., BA, AB, BS)  Occupational History  . Occupation: CEO-retired    Employer: [employer]  . Occupation: Licensed conveyancer:  [place]  Tobacco Use  . Smoking status: Never Smoker  . Smokeless tobacco: Never Used  Vaping Use  . Vaping Use: Never used  Substance and Sexual Activity  . Alcohol use: Yes    Alcohol/week: 2.0 - 3.0 standard drinks    Types: 2 - 3 Standard drinks or equivalent per week    Comment: occassionally  . Drug use: No  . Sexual activity: Not Currently  Other Topics Concern  . Not on file  Social History Narrative   Lives w/ his wife at [place]   Patient is right handed.   Patient drinks very little caffeine.   Social Determinants of Health   Financial Resource Strain:   . Difficulty of Paying Living Expenses: Not on file  Food Insecurity: No Food Insecurity  . Worried About Charity fundraiser in the Last Year: Never true  . Ran Out of Food in the Last Year: Never true  Transportation Needs:   . Lack of Transportation (Medical): Not on file  . Lack of Transportation (Non-Medical): Not on file  Physical Activity: Unknown  . Days of Exercise per Week: 3 days  . Minutes of Exercise per Session: Not on file  Stress:   . Feeling of Stress : Not on file  Social Connections:   . Frequency of Communication with Friends and Family: Not on file  . Frequency of Social Gatherings with Friends and Family: Not on file  . Attends Religious Services: Not on file  . Active Member of Clubs or Organizations: Not on file  . Attends Archivist Meetings: Not on file  . Marital Status: Not on file  Intimate Partner Violence:   . Fear of Current or Ex-Partner: Not on file  . Emotionally Abused: Not on file  . Physically Abused: Not on file  . Sexually Abused: Not on file  Review of Systems    General:  No chills, fever, night sweats or weight changes.  Cardiovascular:  No chest pain, dyspnea on exertion, edema, orthopnea, palpitations, paroxysmal nocturnal dyspnea. Dermatological: No rash, lesions/masses Respiratory: No cough, dyspnea Urologic:  No hematuria, dysuria Abdominal:   No nausea, vomiting, diarrhea, bright red blood per rectum, melena, or hematemesis Neurologic:  No visual changes, wkns, changes in mental status. All other systems reviewed and are otherwise negative except as noted above.  Physical Exam    VS:  BP 124/64   Pulse 87  , BMI There is no height or weight on file to calculate BMI. GEN: Well nourished, well developed, in no acute distress. HEENT: normal. Neck: Supple, no JVD, carotid bruits, or masses. Cardiac: RRR, no murmurs, rubs, or gallops. No clubbing, cyanosis, edema.  Radials/DP/PT 2+ and equal bilaterally.  Respiratory:  Respirations regular and unlabored, clear to auscultation bilaterally. GI: Soft, nontender, nondistended, BS + x 4. MS: no deformity or atrophy. Skin: warm and dry, no rash. Neuro:  Strength and sensation are intact. Psych: Normal affect.  Accessory Clinical Findings    Recent Labs: 07/15/2019: BUN 16; Creatinine 0.9; Potassium 3.7; Sodium 138; TSH 5.09 10/12/2019: Hemoglobin 13.6; Platelets 241   Recent Lipid Panel    Component Value Date/Time   CHOL 131 07/15/2019 0000   TRIG 120 07/15/2019 0000   HDL 63 07/15/2019 0000   LDLCALC 44 07/15/2019 0000    ECG personally reviewed by me today-sinus rhythm first degree AV block RBBB 87 bpm- No acute changes  Echocardiogram 11/14/2014 Study Conclusions   - Left ventricle: The cavity size was normal. There was mild focal  basal and mild concentric hypertrophy of the septum. Systolic  function was vigorous. The estimated ejection fraction was in the  range of 65% to 70%. Wall motion was normal; there were no  regional wall motion abnormalities. Doppler parameters are  consistent with abnormal left ventricular relaxation (grade 1  diastolic dysfunction).  - Mitral valve: There was mild regurgitation.   Impressions:   - Compared to the prior study, there has been no significant  interval change.   Assessment  & Plan   1.  Chronic diastolic congestive heart failure-denies any increased activity intolerance or dyspnea.  Weight [id number].  Has generalized nonpitting right greater than left lower extremity edema.  Continues to be very physically active with his chair type/upper extremity activities. Continue torsemide Heart healthy low-sodium diet-salty 6 given/discussed Maintain physical activity as tolerated Continue lower extremity support stockings Elevate lower extremities when not active Daily weights-contact office with a weight increase of 3 pounds overnight or 5 pounds in 1 week.  Coronary artery disease-denies chest pain today.  Cardiac catheterization 2013 showed patent LAD stent.  Nuclear stress test 1/16 was normal. Continue aspirin, atorvastatin, nitroglycerin Heart healthy low-sodium diet-salty 6 given Increase physical activity as tolerated  Essential hypertension/orthostatic hypotension -BP today 124/64.  Well-controlled at home.  Denies episodes of lightheadedness presyncope/syncope. Continue midodrine as needed Heart healthy low-sodium diet-salty 6 given Increase physical activity as tolerated  Parkinson's disease-continue medical therapy Follows with Dr. Jannifer Franklin  Disposition: Follow-up with Dr. [name] or me in 6 months.  Jossie Ng. Coley Kulikowski NP-C    02/24/2020, 3:10 PM [facility] [address] Office [phone] Fax [phone]  Notice: This dictation was prepared with Dragon dictation along with smaller phrase technology. Any transcriptional errors that result from this process are unintentional and may not be corrected upon review.

## 2020-02-22 DIAGNOSIS — M6389 Disorders of muscle in diseases classified elsewhere, multiple sites: Secondary | ICD-10-CM | POA: Diagnosis not present

## 2020-02-22 DIAGNOSIS — R293 Abnormal posture: Secondary | ICD-10-CM | POA: Diagnosis not present

## 2020-02-22 DIAGNOSIS — G20 Parkinson's disease: Secondary | ICD-10-CM | POA: Diagnosis not present

## 2020-02-23 DIAGNOSIS — D485 Neoplasm of uncertain behavior of skin: Secondary | ICD-10-CM | POA: Diagnosis not present

## 2020-02-23 DIAGNOSIS — L570 Actinic keratosis: Secondary | ICD-10-CM | POA: Diagnosis not present

## 2020-02-23 DIAGNOSIS — C4442 Squamous cell carcinoma of skin of scalp and neck: Secondary | ICD-10-CM | POA: Diagnosis not present

## 2020-02-23 DIAGNOSIS — Z85828 Personal history of other malignant neoplasm of skin: Secondary | ICD-10-CM | POA: Diagnosis not present

## 2020-02-23 DIAGNOSIS — Z85820 Personal history of malignant melanoma of skin: Secondary | ICD-10-CM | POA: Diagnosis not present

## 2020-02-24 ENCOUNTER — Ambulatory Visit: Payer: PPO | Admitting: General Practice

## 2020-02-24 ENCOUNTER — Other Ambulatory Visit: Payer: Self-pay

## 2020-02-24 ENCOUNTER — Encounter: Payer: Self-pay | Admitting: General Practice

## 2020-02-24 VITALS — BP 124/64 | HR 87

## 2020-02-24 DIAGNOSIS — M6389 Disorders of muscle in diseases classified elsewhere, multiple sites: Secondary | ICD-10-CM | POA: Diagnosis not present

## 2020-02-24 DIAGNOSIS — I10 Essential (primary) hypertension: Secondary | ICD-10-CM

## 2020-02-24 DIAGNOSIS — I5032 Chronic diastolic (congestive) heart failure: Secondary | ICD-10-CM

## 2020-02-24 DIAGNOSIS — R293 Abnormal posture: Secondary | ICD-10-CM | POA: Diagnosis not present

## 2020-02-24 DIAGNOSIS — I951 Orthostatic hypotension: Secondary | ICD-10-CM | POA: Diagnosis not present

## 2020-02-24 DIAGNOSIS — I2510 Atherosclerotic heart disease of native coronary artery without angina pectoris: Secondary | ICD-10-CM

## 2020-02-24 DIAGNOSIS — G20 Parkinson's disease: Secondary | ICD-10-CM

## 2020-02-24 NOTE — Addendum Note (Signed)
Addended by: Zebedee Iba on: 02/24/2020 03:48 PM   Modules accepted: Orders

## 2020-02-24 NOTE — Patient Instructions (Signed)
Medication Instructions:  The current medical regimen is effective;  continue present plan and medications as directed. Please refer to the Current Medication list given to you today.  *If you need a refill on your cardiac medications before your next appointment, please call your pharmacy*  Lab Work:   Testing/Procedures:  NONE    NONE  Special Instructions PLEASE CALL IF YOUR WEIGHT IS >3lbs DAY OR >5lbs IN A WEEK  PLEASE READ AND FOLLOW SALTY 6-ATTACHED-1,847m daily  Follow-Up: Your next appointment:  6 month(s) In Person with Peter JMartinique MD OR IF UNAVAILABLE JGlade FNP-C Please call our office 2 months in advance to schedule this appointment   At [facility] you and your health needs are our priority.  As part of our continuing mission to provide you with exceptional heart care, we have created designated Provider Care Teams.  These Care Teams include your primary Cardiologist (physician) and Advanced Practice Providers (APPs -  Physician Assistants and Nurse Practitioners) who all work together to provide you with the care you need, when you need it.            6 SALTY THINGS TO AVOID     1,800MG DAILY

## 2020-02-25 DIAGNOSIS — M6389 Disorders of muscle in diseases classified elsewhere, multiple sites: Secondary | ICD-10-CM | POA: Diagnosis not present

## 2020-02-25 DIAGNOSIS — R293 Abnormal posture: Secondary | ICD-10-CM | POA: Diagnosis not present

## 2020-02-25 DIAGNOSIS — G20 Parkinson's disease: Secondary | ICD-10-CM | POA: Diagnosis not present

## 2020-02-28 DIAGNOSIS — M6389 Disorders of muscle in diseases classified elsewhere, multiple sites: Secondary | ICD-10-CM | POA: Diagnosis not present

## 2020-02-28 DIAGNOSIS — G20 Parkinson's disease: Secondary | ICD-10-CM | POA: Diagnosis not present

## 2020-02-28 DIAGNOSIS — R293 Abnormal posture: Secondary | ICD-10-CM | POA: Diagnosis not present

## 2020-02-29 DIAGNOSIS — G20 Parkinson's disease: Secondary | ICD-10-CM | POA: Diagnosis not present

## 2020-02-29 DIAGNOSIS — M6389 Disorders of muscle in diseases classified elsewhere, multiple sites: Secondary | ICD-10-CM | POA: Diagnosis not present

## 2020-02-29 DIAGNOSIS — R293 Abnormal posture: Secondary | ICD-10-CM | POA: Diagnosis not present

## 2020-03-06 ENCOUNTER — Non-Acute Institutional Stay (SKILLED_NURSING_FACILITY): Payer: PPO | Admitting: Adult Health

## 2020-03-06 DIAGNOSIS — I951 Orthostatic hypotension: Secondary | ICD-10-CM

## 2020-03-06 DIAGNOSIS — I5032 Chronic diastolic (congestive) heart failure: Secondary | ICD-10-CM | POA: Diagnosis not present

## 2020-03-06 DIAGNOSIS — F422 Mixed obsessional thoughts and acts: Secondary | ICD-10-CM | POA: Diagnosis not present

## 2020-03-06 DIAGNOSIS — I10 Essential (primary) hypertension: Secondary | ICD-10-CM

## 2020-03-06 DIAGNOSIS — F0280 Dementia in other diseases classified elsewhere without behavioral disturbance: Secondary | ICD-10-CM

## 2020-03-06 DIAGNOSIS — K219 Gastro-esophageal reflux disease without esophagitis: Secondary | ICD-10-CM

## 2020-03-06 DIAGNOSIS — G20 Parkinson's disease: Secondary | ICD-10-CM

## 2020-03-07 DIAGNOSIS — M6389 Disorders of muscle in diseases classified elsewhere, multiple sites: Secondary | ICD-10-CM | POA: Diagnosis not present

## 2020-03-07 DIAGNOSIS — G20 Parkinson's disease: Secondary | ICD-10-CM | POA: Diagnosis not present

## 2020-03-07 DIAGNOSIS — R293 Abnormal posture: Secondary | ICD-10-CM | POA: Diagnosis not present

## 2020-03-09 ENCOUNTER — Encounter: Payer: Self-pay | Admitting: Adult Health

## 2020-03-09 DIAGNOSIS — F422 Mixed obsessional thoughts and acts: Secondary | ICD-10-CM | POA: Insufficient documentation

## 2020-03-09 NOTE — Progress Notes (Signed)
Location:  Occupational psychologist of Service:  SNF (31) Provider:   Cindi Carbon, ANP [place] [phone]   Gayland Curry, DO  Patient Care Team: Gayland Curry, DO as PCP - General (Geriatric Medicine) [name], [name], MD as PCP - Cardiology (Cardiology) [name], [name], MD as Consulting Physician (Cardiology) [name], MD as Attending Physician (Gastroenterology) [name], MD as Consulting Physician (Neurology) Community, [facility] ([place]) Royal Hawthorn, NP as Nurse Practitioner (Nurse Practitioner)  Extended Emergency Contact Information Primary Emergency Contact: [name] Address: [address] [place] [address] [place]          [place], [place] [address] [name] of [place] Phone: [phone] Mobile Phone: [phone] Relation: Spouse  Code Status:  DNR Goals of care: Advanced Directive information Advanced Directives 11/11/2019  Does Patient Have a Medical Advance Directive? Yes  Type of Paramedic of [place];Out of facility DNR (pink MOST or yellow form);Living will  Does patient want to make changes to medical advance directive? No - Patient declined  Copy of [place] in Chart? Yes - validated most recent copy scanned in chart (See row information)  Pre-existing out of facility DNR order (yellow form or pink MOST form) -     Chief Complaint  Patient presents with  . Medical Management of Chronic Issues    HPI:  Pt is a 86 y.o. male seen today for medical management of chronic diseases.    Nursing reports continue issues with OCD. Has feelings of needing to be clean all the time. Can be demanding about his routine and cleaning habits. He is currently being tapered off Zoloft due to lack of benefit and starting Luvox on 12/6.  He denies having any feelings of depression, anxiety, or insomnia. Seems content and enjoys going out to  eat with friends and going to activities. Unfortunately he is late to activities due to his routines/rituals.   BP 110-130.  Continues on torsemide with a hx of diastolic CHF EF 10%.  Has chronic mild edema in his feet that is unchanged. No issues with sob, pnd, doe, etc.   [place] a few days ago out of the [facility] as it was not locked but did not sustain an energy. There is concern for safety awareness in his motorized WC and he has seen OT for this reason.     Past Medical History:  Diagnosis Date  . Cellulitis   . Chronic diastolic CHF (congestive heart failure) (HCC)    a. EF initially 35-40% after MI 1/09; b. echo 7/08: EF 60%;  c. 11/2014 Echo: EF 65-70%, Gr 1 DD, mild MR.  . Coronary artery disease    a. s/p anterior STEMI 04/2007 with BMS-> LAD;  b. Cath 12/2011 patent stent;  c. low risk nuc 04/2014.  . Crohn's disease ([place])   . Diaphragmatic hernia without mention of obstruction or gangrene   . Diverticulosis of colon (without mention of hemorrhage)   . Fatty liver    a. on ultrasound of 10/2009  . Flatulence, eructation, and gas pain   . Fungal infection   . Gait disorder   . GERD (gastroesophageal reflux disease)   . HOH (hard of hearing)    Hearing aids  . Hyperlipidemia   . Hypertension   . Ischemic cardiomyopathy    a. EF initially 35-40% after MI 1/09; b. echo 7/08: EF 60%;  c. 11/2014 Echo: EF 65-70%, Gr  1 DD, mild MR.  . Melanoma ([place])   . Memory disorder   . Orthostatic hypotension   . Osteoporosis   . Other chronic nonalcoholic liver disease   . Other esophagitis   . Parkinson's disease ([place])   . RBBB 09/02/2013  . Regional enteritis of small intestine ([place])   . Renal calculi   . Restless leg syndrome 03/22/2015  . Ulcerative (chronic) ileocolitis ([place])   . Urinary incontinence    Past Surgical History:  Procedure Laterality Date  . APPENDECTOMY    . BACK SURGERY     L3, L4, L5  . CARDIAC CATHETERIZATION  09/25/06   EF 35-40% but more recently 60%  . CATARACT  EXTRACTION     Bilateral  . CHOLECYSTECTOMY    . Coronary artery stent placement    . Melanoma resection    . Partial bowel resection    . TONSILLECTOMY      No Known Allergies  Outpatient Encounter Medications as of 03/06/2020  Medication Sig  . [START ON 03/13/2020] fluvoxaMINE (LUVOX) 50 MG tablet Take 50 mg by mouth at bedtime. 50 mg qhs for 2 weeks then 100 mg qhs  . acetaminophen (TYLENOL) 325 MG tablet Take 650 mg by mouth at bedtime. And q 6 hrs prn  . aspirin EC 81 MG tablet Take 81 mg by mouth daily.  Marland Kitchen atorvastatin (LIPITOR) 40 MG tablet Take 40 mg by mouth daily.  . calcium carbonate (CALCIUM 600) 600 MG TABS tablet Take 600 mg by mouth daily with breakfast.  . carbidopa-levodopa (SINEMET CR) 50-200 MG tablet Take 1 tablet by mouth 4 (four) times daily.  . carbidopa-levodopa (SINEMET IR) 25-100 MG tablet Take 1 tablet by mouth 4 (four) times daily.  . Cholecalciferol (VITAMIN D3) 50 MCG (2000 UT) TABS Take 1 tablet by mouth daily.  . cholestyramine (QUESTRAN) 4 g packet TAKE 1 PACKET BY MOUTH TWICE DAILY WITH A MEAL  . donepezil (ARICEPT) 10 MG tablet TAKE ONE TABLET AT BEDTIME  . ipratropium (ATROVENT) 0.03 % nasal spray Place 2 sprays into both nostrils 3 (three) times daily.  . Melatonin 5 MG TABS Take 1 tablet by mouth at bedtime.   . midodrine (PROAMATINE) 5 MG tablet Take 1 tablet (5 mg total) by mouth 2 (two) times daily with a meal.  . nitroGLYCERIN (NITROSTAT) 0.4 MG SL tablet Place 1 tablet (0.4 mg total) under the tongue every 5 (five) minutes as needed for chest pain. PT OVERDUE FOR OV PLEASE CALL FOR APPT  . pantoprazole (PROTONIX) 40 MG tablet Take 40 mg by mouth daily.   . potassium chloride SA (K-DUR,KLOR-CON) 20 MEQ tablet Take 40 mEq by mouth daily.  . Probiotic Product (ALIGN) 4 MG CAPS Take 1 capsule by mouth daily.   . sertraline (ZOLOFT) 100 MG tablet Take 200 mg by mouth daily. 100 mg qd for 3 days then 63m qd for 3 days then d/c  . sodium chloride  (OCEAN) 0.65 % SOLN nasal spray Place 2 sprays into both nostrils daily as needed for congestion.   . torsemide (DEMADEX) 20 MG tablet Take 1 tablet (20 mg total) by mouth daily.  . vitamin B-12 (CYANOCOBALAMIN) 1000 MCG tablet Take 1,000 mcg by mouth daily.   No facility-administered encounter medications on file as of 03/06/2020.    Review of Systems  Constitutional: Negative for activity change, appetite change, chills, diaphoresis, fatigue, fever and unexpected weight change.  HENT: Negative for congestion, sore throat and trouble swallowing.  Respiratory: Negative for cough, shortness of breath, wheezing and stridor.   Cardiovascular: Positive for leg swelling. Negative for chest pain and palpitations.  Gastrointestinal: Negative for abdominal distention, abdominal pain, constipation and diarrhea.  Genitourinary: Negative for difficulty urinating and dysuria.  Musculoskeletal: Positive for gait problem. Negative for arthralgias, back pain, joint swelling and myalgias.  Neurological: Negative for dizziness, tremors, seizures, syncope, facial asymmetry, speech difficulty, weakness and headaches.  Hematological: Negative for adenopathy. Does not bruise/bleed easily.  Psychiatric/Behavioral: Positive for behavioral problems and confusion. Negative for agitation, hallucinations and self-injury. The patient is not nervous/anxious and is not hyperactive.     Immunization History  Administered Date(s) Administered  . Influenza, High Dose Seasonal PF 01/21/2019  . Influenza-Unspecified 12/06/2014, 11/27/2017, 02/01/2020  . Moderna SARS-COVID-2 Vaccination 04/13/2019, 05/11/2019  . Pneumococcal Conjugate-13 05/21/2018  . Pneumococcal Polysaccharide-23 05/21/2005  . Tdap 06/03/2006, 10/22/2018  . Zoster Recombinat (Shingrix) 10/22/2018, 12/24/2018   Pertinent  Health Maintenance Due  Topic Date Due  . URINE MICROALBUMIN  Never done  . INFLUENZA VACCINE  Completed  . PNA vac Low Risk  Adult  Completed   Fall Risk  11/11/2019 07/20/2019 10/22/2018 05/12/2018 10/03/2017  Falls in the past year? 1 - 1 1 No  Number falls in past yr: 0 - 1 1 -  Injury with Fall? 1 - 1 0 -  Risk for fall due to : History of fall(s);Impaired balance/gait History of fall(s);Impaired balance/gait;Impaired mobility;Mental status change;Medication side effect History of fall(s);Impaired mobility Impaired mobility;History of fall(s);Impaired balance/gait;Mental status change;Medication side effect -  Follow up Falls evaluation completed Education provided;Falls prevention discussed;Falls evaluation completed Falls evaluation completed;Falls prevention discussed Falls evaluation completed;Falls prevention discussed;Education provided -  Comment - at [place] - - -   Functional Status Survey:    Vitals:   03/09/20 1538  Weight: 176 lb (79.8 kg)   Body mass index is 25.99 kg/m.  Wt Readings from Last 3 Encounters:  03/09/20 176 lb (79.8 kg)  01/28/20 174 lb 14.4 oz (79.3 kg)  11/11/19 181 lb (82.1 kg)    Physical Exam Vitals and nursing note reviewed.  Constitutional:      General: He is not in acute distress.    Appearance: He is not diaphoretic.  HENT:     Head: Normocephalic and atraumatic.  Neck:     Thyroid: No thyromegaly.     Vascular: No JVD.     Trachea: No tracheal deviation.  Cardiovascular:     Rate and Rhythm: Normal rate and regular rhythm.     Heart sounds: No murmur heard.   Pulmonary:     Effort: Pulmonary effort is normal. No respiratory distress.     Breath sounds: Normal breath sounds. No wheezing.  Abdominal:     General: Bowel sounds are normal. There is no distension.     Palpations: Abdomen is soft.     Tenderness: There is no abdominal tenderness.     Comments: rotund  Musculoskeletal:     Right lower leg: Edema present.     Left lower leg: Edema present.     Comments: +1 BLE edema   Lymphadenopathy:     Cervical: No cervical adenopathy.  Skin:     General: Skin is warm and dry.  Neurological:     Mental Status: He is alert and oriented to person, place, and time.     Cranial Nerves: No cranial nerve deficit.     Labs reviewed: Recent Labs    07/15/19 0000  NA  138  K 3.7  CL 97*  CO2 24*  BUN 16  CREATININE 0.9  CALCIUM 8.9   Recent Labs    07/15/19 0000  ALBUMIN 4.7   Recent Labs    08/19/19 0000 10/12/19 0000  WBC 3.1 8.1  NEUTROABS  --  5  HGB 13.5 13.6  HCT 41 40*  PLT 138* 241   Lab Results  Component Value Date   TSH 5.09 07/15/2019   No results found for: HGBA1C Lab Results  Component Value Date   CHOL 131 07/15/2019   HDL 63 07/15/2019   LDLCALC 44 07/15/2019   TRIG 120 07/15/2019    Significant Diagnostic Results in last 30 days:  No results found.  Assessment/Plan  1. Mixed obsessional thoughts and acts No improvement with Zoloft. Tapering and trial of Luvox next per psychiatry.   2. Dementia due to Parkinson's disease without behavioral disturbance [facility]) MMSE - Mini Mental State Exam 11/11/2019 09/30/2019 05/24/2019 12/16/2018 10/22/2018 02/20/2018 10/03/2017  Not completed: - - (No Data) - - - -  Orientation to time 5 5 4 3 4 4 4   Orientation to Place 4 5 4 5 5 5 5   Registration 3 3 3 3 3 3 3   Attention/ Calculation 3 2 5 5 5 4 1   Recall 2 3 1 3 3 3 3   Language- name 2 objects 2 2 2 2 2 2 2   Language- repeat 1 1 1  0 1 1 1   Language- follow 3 step command 3 3 3 3 3 3 3   Language- read & follow direction 1 1 1 1 1 1 1   Write a sentence 1 1 1 1 1 1 1   Copy design 1 1 1 1 1  0 1  Total score 26 27 26 27 29 27 25    Poor safety awareness Continues on Aricept per neurology    3. Parkinson disease ([place]) No current issues Continues on Sinemet for this per neurology.   4. Gastroesophageal reflux disease, unspecified whether esophagitis present Controlled Continue Protonix 40 mg qd   5. Essential hypertension Controlled   6. Chronic diastolic CHF (congestive heart failure)  (HCC) Compensated Continue torsemide 20 mg qd   7. Orthostatic hypotension No new episodes  Continue Midodrine   Family/ staff Communication: nurse  Labs/tests ordered:  NA

## 2020-03-10 DIAGNOSIS — G20 Parkinson's disease: Secondary | ICD-10-CM | POA: Diagnosis not present

## 2020-03-10 DIAGNOSIS — M6389 Disorders of muscle in diseases classified elsewhere, multiple sites: Secondary | ICD-10-CM | POA: Diagnosis not present

## 2020-03-10 DIAGNOSIS — R293 Abnormal posture: Secondary | ICD-10-CM | POA: Diagnosis not present

## 2020-03-11 DIAGNOSIS — G20 Parkinson's disease: Secondary | ICD-10-CM | POA: Diagnosis not present

## 2020-03-11 DIAGNOSIS — M6389 Disorders of muscle in diseases classified elsewhere, multiple sites: Secondary | ICD-10-CM | POA: Diagnosis not present

## 2020-03-11 DIAGNOSIS — R293 Abnormal posture: Secondary | ICD-10-CM | POA: Diagnosis not present

## 2020-03-14 DIAGNOSIS — M79672 Pain in left foot: Secondary | ICD-10-CM | POA: Diagnosis not present

## 2020-03-14 DIAGNOSIS — M79671 Pain in right foot: Secondary | ICD-10-CM | POA: Diagnosis not present

## 2020-03-14 DIAGNOSIS — L602 Onychogryphosis: Secondary | ICD-10-CM | POA: Diagnosis not present

## 2020-03-15 DIAGNOSIS — M6389 Disorders of muscle in diseases classified elsewhere, multiple sites: Secondary | ICD-10-CM | POA: Diagnosis not present

## 2020-03-15 DIAGNOSIS — R293 Abnormal posture: Secondary | ICD-10-CM | POA: Diagnosis not present

## 2020-03-15 DIAGNOSIS — G20 Parkinson's disease: Secondary | ICD-10-CM | POA: Diagnosis not present

## 2020-03-16 DIAGNOSIS — M6389 Disorders of muscle in diseases classified elsewhere, multiple sites: Secondary | ICD-10-CM | POA: Diagnosis not present

## 2020-03-16 DIAGNOSIS — R293 Abnormal posture: Secondary | ICD-10-CM | POA: Diagnosis not present

## 2020-03-16 DIAGNOSIS — G20 Parkinson's disease: Secondary | ICD-10-CM | POA: Diagnosis not present

## 2020-03-20 DIAGNOSIS — M6389 Disorders of muscle in diseases classified elsewhere, multiple sites: Secondary | ICD-10-CM | POA: Diagnosis not present

## 2020-03-20 DIAGNOSIS — G20 Parkinson's disease: Secondary | ICD-10-CM | POA: Diagnosis not present

## 2020-03-20 DIAGNOSIS — R293 Abnormal posture: Secondary | ICD-10-CM | POA: Diagnosis not present

## 2020-03-21 DIAGNOSIS — L308 Other specified dermatitis: Secondary | ICD-10-CM | POA: Diagnosis not present

## 2020-03-21 DIAGNOSIS — L821 Other seborrheic keratosis: Secondary | ICD-10-CM | POA: Diagnosis not present

## 2020-03-21 DIAGNOSIS — L570 Actinic keratosis: Secondary | ICD-10-CM | POA: Diagnosis not present

## 2020-03-21 DIAGNOSIS — Z85820 Personal history of malignant melanoma of skin: Secondary | ICD-10-CM | POA: Diagnosis not present

## 2020-03-21 DIAGNOSIS — Z85828 Personal history of other malignant neoplasm of skin: Secondary | ICD-10-CM | POA: Diagnosis not present

## 2020-03-22 DIAGNOSIS — R293 Abnormal posture: Secondary | ICD-10-CM | POA: Diagnosis not present

## 2020-03-22 DIAGNOSIS — G20 Parkinson's disease: Secondary | ICD-10-CM | POA: Diagnosis not present

## 2020-03-22 DIAGNOSIS — M6389 Disorders of muscle in diseases classified elsewhere, multiple sites: Secondary | ICD-10-CM | POA: Diagnosis not present

## 2020-03-24 DIAGNOSIS — M6389 Disorders of muscle in diseases classified elsewhere, multiple sites: Secondary | ICD-10-CM | POA: Diagnosis not present

## 2020-03-24 DIAGNOSIS — G20 Parkinson's disease: Secondary | ICD-10-CM | POA: Diagnosis not present

## 2020-03-24 DIAGNOSIS — R293 Abnormal posture: Secondary | ICD-10-CM | POA: Diagnosis not present

## 2020-03-27 DIAGNOSIS — M6389 Disorders of muscle in diseases classified elsewhere, multiple sites: Secondary | ICD-10-CM | POA: Diagnosis not present

## 2020-03-27 DIAGNOSIS — R293 Abnormal posture: Secondary | ICD-10-CM | POA: Diagnosis not present

## 2020-03-27 DIAGNOSIS — G20 Parkinson's disease: Secondary | ICD-10-CM | POA: Diagnosis not present

## 2020-03-29 DIAGNOSIS — R293 Abnormal posture: Secondary | ICD-10-CM | POA: Diagnosis not present

## 2020-03-29 DIAGNOSIS — M6389 Disorders of muscle in diseases classified elsewhere, multiple sites: Secondary | ICD-10-CM | POA: Diagnosis not present

## 2020-03-29 DIAGNOSIS — G20 Parkinson's disease: Secondary | ICD-10-CM | POA: Diagnosis not present

## 2020-03-30 ENCOUNTER — Telehealth: Payer: Self-pay | Admitting: *Deleted

## 2020-03-30 ENCOUNTER — Ambulatory Visit: Payer: PPO | Admitting: Neurology

## 2020-03-30 ENCOUNTER — Encounter: Payer: Self-pay | Admitting: Neurology

## 2020-03-30 NOTE — Telephone Encounter (Signed)
No showed follow up appointment. 

## 2020-04-20 ENCOUNTER — Non-Acute Institutional Stay (SKILLED_NURSING_FACILITY): Payer: PPO | Admitting: Adult Health

## 2020-04-20 ENCOUNTER — Encounter: Payer: Self-pay | Admitting: Adult Health

## 2020-04-20 DIAGNOSIS — F0280 Dementia in other diseases classified elsewhere without behavioral disturbance: Secondary | ICD-10-CM | POA: Diagnosis not present

## 2020-04-20 DIAGNOSIS — I2510 Atherosclerotic heart disease of native coronary artery without angina pectoris: Secondary | ICD-10-CM | POA: Diagnosis not present

## 2020-04-20 DIAGNOSIS — I951 Orthostatic hypotension: Secondary | ICD-10-CM | POA: Diagnosis not present

## 2020-04-20 DIAGNOSIS — G20 Parkinson's disease: Secondary | ICD-10-CM

## 2020-04-20 DIAGNOSIS — F422 Mixed obsessional thoughts and acts: Secondary | ICD-10-CM

## 2020-04-20 DIAGNOSIS — I5032 Chronic diastolic (congestive) heart failure: Secondary | ICD-10-CM

## 2020-04-20 NOTE — Progress Notes (Signed)
Location:  Occupational psychologist of Service:  SNF (31) Provider:   Cindi Carbon, ANP [place] [phone]   Gayland Curry, DO  Patient Care Team: Gayland Curry, DO as PCP - General (Geriatric Medicine) [name], [name], MD as PCP - Cardiology (Cardiology) [name], [name], MD as Consulting Physician (Cardiology) [name], MD as Attending Physician (Gastroenterology) [name], MD as Consulting Physician (Neurology) Community, [facility] ([place]) Royal Hawthorn, NP as Nurse Practitioner (Nurse Practitioner)  Extended Emergency Contact Information Primary Emergency Contact: [name] Address: [address] [address] [place]          [place], [place] [address] [name] of [place] Phone: [phone] Mobile Phone: [phone] Relation: Spouse  Code Status:  DNR Goals of care: Advanced Directive information Advanced Directives 11/11/2019  Does Patient Have a Medical Advance Directive? Yes  Type of Paramedic of [place];Out of facility DNR (pink MOST or yellow form);Living will  Does patient want to make changes to medical advance directive? No - Patient declined  Copy of [place] in Chart? Yes - validated most recent copy scanned in chart (See row information)  Pre-existing out of facility DNR order (yellow form or pink MOST form) -     Chief Complaint  Patient presents with  . Medical Management of Chronic Issues    HPI:  Pt is a 87 y.o. male seen today for medical management of chronic diseases.    Mr. Cuervo has no acute complaints for this visit. He is currently on Luvox for OCD behaviors surrounding meals and hygiene. He is followed by Dr. Casimiro Needle. He continues to have rituals surrounding staying clean which causes dryness and excoriation to his arms. Otherwise he is doing well. He denies any feelings of depression or anxiety. His  appetite is adequate and he reports that he is sleeping well. He has mild dementia but continues to participate in activities. He is PD but there has been no reports of worsening symptoms such as tremor or rigidity. Bps are adequate with no hypotension or syncope. No hallucinations. No dysphagia.  Past Medical History:  Diagnosis Date  . Cellulitis   . Chronic diastolic CHF (congestive heart failure) (HCC)    a. EF initially 35-40% after MI 1/09; b. echo 7/08: EF 60%;  c. 11/2014 Echo: EF 65-70%, Gr 1 DD, mild MR.  . Coronary artery disease    a. s/p anterior STEMI 04/2007 with BMS-> LAD;  b. Cath 12/2011 patent stent;  c. low risk nuc 04/2014.  . Crohn's disease ([place])   . Diaphragmatic hernia without mention of obstruction or gangrene   . Diverticulosis of colon (without mention of hemorrhage)   . Fatty liver    a. on ultrasound of 10/2009  . Flatulence, eructation, and gas pain   . Fungal infection   . Gait disorder   . GERD (gastroesophageal reflux disease)   . HOH (hard of hearing)    Hearing aids  . Hyperlipidemia   . Hypertension   . Ischemic cardiomyopathy    a. EF initially 35-40% after MI 1/09; b. echo 7/08: EF 60%;  c. 11/2014 Echo: EF 65-70%, Gr 1 DD, mild MR.  . Melanoma ([place])   . Memory disorder   . Orthostatic hypotension   . Osteoporosis   . Other chronic nonalcoholic liver disease   . Other esophagitis   . Parkinson's disease ([place])   . RBBB 09/02/2013  .  Regional enteritis of small intestine ([place])   . Renal calculi   . Restless leg syndrome 03/22/2015  . 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 Urinary incontinence    Past Surgical History:  Procedure Laterality Date  . APPENDECTOMY    . BACK SURGERY     L3, L4, L5  . CARDIAC CATHETERIZATION  09/25/06   EF 35-40% but more recently 60%  . CATARACT EXTRACTION     Bilateral  . CHOLECYSTECTOMY    . Coronary artery stent placement    . Melanoma resection    . Partial bowel resection    . TONSILLECTOMY      No  Known Allergies  Outpatient Encounter Medications as of 04/20/2020  Medication Sig  . acetaminophen (TYLENOL) 325 MG tablet Take 650 mg by mouth at bedtime. And q 6 hrs prn  . aspirin EC 81 MG tablet Take 81 mg by mouth daily.  Marland Kitchen atorvastatin (LIPITOR) 40 MG tablet Take 40 mg by mouth daily.  . calcium carbonate (CALCIUM 600) 600 MG TABS tablet Take 600 mg by mouth daily with breakfast.  . carbidopa-levodopa (SINEMET CR) 50-200 MG tablet Take 1 tablet by mouth 4 (four) times daily.  . carbidopa-levodopa (SINEMET IR) 25-100 MG tablet Take 1 tablet by mouth 4 (four) times daily.  . Cholecalciferol (VITAMIN D3) 50 MCG (2000 UT) TABS Take 1 tablet by mouth daily.  . cholestyramine (QUESTRAN) 4 g packet TAKE 1 PACKET BY MOUTH TWICE DAILY WITH A MEAL  . donepezil (ARICEPT) 10 MG tablet TAKE ONE TABLET AT BEDTIME  . fluvoxaMINE (LUVOX) 50 MG tablet Take 50 mg by mouth at bedtime. 50 mg qhs for 2 weeks then 100 mg qhs  . ipratropium (ATROVENT) 0.03 % nasal spray Place 2 sprays into both nostrils 3 (three) times daily.  . Melatonin 5 MG TABS Take 1 tablet by mouth at bedtime.   . midodrine (PROAMATINE) 5 MG tablet Take 1 tablet (5 mg total) by mouth 2 (two) times daily with a meal.  . nitroGLYCERIN (NITROSTAT) 0.4 MG SL tablet Place 1 tablet (0.4 mg total) under the tongue every 5 (five) minutes as needed for chest pain. PT OVERDUE FOR OV PLEASE CALL FOR APPT  . pantoprazole (PROTONIX) 40 MG tablet Take 40 mg by mouth daily.   . potassium chloride SA (K-DUR,KLOR-CON) 20 MEQ tablet Take 40 mEq by mouth daily.  . Probiotic Product (ALIGN) 4 MG CAPS Take 1 capsule by mouth daily.   . sodium chloride (OCEAN) 0.65 % SOLN nasal spray Place 2 sprays into both nostrils daily as needed for congestion.   . torsemide (DEMADEX) 20 MG tablet Take 1 tablet (20 mg total) by mouth daily.  . vitamin B-12 (CYANOCOBALAMIN) 1000 MCG tablet Take 1,000 mcg by mouth daily.  . [DISCONTINUED] sertraline (ZOLOFT) 100 MG tablet  Take 200 mg by mouth daily. 100 mg qd for 3 days then 41m qd for 3 days then d/c   No facility-administered encounter medications on file as of 04/20/2020.    Review of Systems  Constitutional: Negative for activity change, appetite change, chills, diaphoresis, fatigue, fever and unexpected weight change.  HENT: Negative for congestion.   Respiratory: Negative for cough, shortness of breath, wheezing and stridor.   Cardiovascular: Positive for leg swelling. Negative for chest pain and palpitations.  Gastrointestinal: Negative for abdominal distention, abdominal pain, constipation and diarrhea.  Genitourinary: Negative for difficulty urinating and dysuria.  Musculoskeletal: Positive for gait problem. Negative for arthralgias, back pain, joint swelling  and myalgias.  Neurological: Negative for dizziness, seizures, syncope, facial asymmetry, speech difficulty, weakness and headaches.  Hematological: Negative for adenopathy. Does not bruise/bleed easily.  Psychiatric/Behavioral: Negative for agitation, behavioral problems and confusion.       Mild memory loss. Cleaning rituals, obsessions with routine     Immunization History  Administered Date(s) Administered  . Influenza, High Dose Seasonal PF 01/21/2019  . Influenza-Unspecified 12/06/2014, 11/27/2017, 02/01/2020  . Moderna Sars-Covid-2 Vaccination 04/13/2019, 05/11/2019, 02/18/2020  . Pneumococcal Conjugate-13 05/21/2018  . Pneumococcal Polysaccharide-23 05/21/2005  . Tdap 06/03/2006, 10/22/2018  . Zoster Recombinat (Shingrix) 10/22/2018, 12/24/2018   Pertinent  Health Maintenance Due  Topic Date Due  . URINE MICROALBUMIN  05/21/2020 (Originally 01/07/1939)  . INFLUENZA VACCINE  Completed  . PNA vac Low Risk Adult  Completed   Fall Risk  11/11/2019 07/20/2019 10/22/2018 05/12/2018 10/03/2017  Falls in the past year? 1 - 1 1 No  Number falls in past yr: 0 - 1 1 -  Injury with Fall? 1 - 1 0 -  Risk for fall due to : History of  fall(s);Impaired balance/gait History of fall(s);Impaired balance/gait;Impaired mobility;Mental status change;Medication side effect History of fall(s);Impaired mobility Impaired mobility;History of fall(s);Impaired balance/gait;Mental status change;Medication side effect -  Follow up Falls evaluation completed Education provided;Falls prevention discussed;Falls evaluation completed Falls evaluation completed;Falls prevention discussed Falls evaluation completed;Falls prevention discussed;Education provided -  Comment - at [place] - - -   Functional Status Survey:    Vitals:   04/20/20 1543  Weight: 180 lb (81.6 kg)   Body mass index is 26.58 kg/m. Physical Exam Vitals and nursing note reviewed.  Constitutional:      General: He is not in acute distress.    Appearance: He is not diaphoretic.  HENT:     Head: Normocephalic and atraumatic.     Mouth/Throat:     Mouth: Mucous membranes are moist.     Pharynx: Oropharynx is clear.  Neck:     Thyroid: No thyromegaly.     Vascular: No JVD.     Trachea: No tracheal deviation.  Cardiovascular:     Rate and Rhythm: Normal rate and regular rhythm.     Heart sounds: No murmur heard.   Pulmonary:     Effort: Pulmonary effort is normal. No respiratory distress.     Breath sounds: Normal breath sounds. No wheezing.  Abdominal:     General: Bowel sounds are normal. There is no distension.     Palpations: Abdomen is soft.     Tenderness: There is no abdominal tenderness.  Musculoskeletal:     Cervical back: Rigidity present. No tenderness.     Comments: BLE trace edema   Lymphadenopathy:     Cervical: No cervical adenopathy.  Skin:    General: Skin is warm and dry.     Comments: Dry skin to BUE  Neurological:     Mental Status: He is alert and oriented to person, place, and time.     Cranial Nerves: No cranial nerve deficit.  Psychiatric:        Mood and Affect: Mood and affect and mood normal.     Labs reviewed: Recent  Labs    07/15/19 0000  NA 138  K 3.7  CL 97*  CO2 24*  BUN 16  CREATININE 0.9  CALCIUM 8.9   Recent Labs    07/15/19 0000  ALBUMIN 4.7   Recent Labs    08/19/19 0000 10/12/19 0000  WBC 3.1 8.1  NEUTROABS  --  5  HGB 13.5 13.6  HCT 41 40*  PLT 138* 241   Lab Results  Component Value Date   TSH 5.09 07/15/2019   No results found for: HGBA1C Lab Results  Component Value Date   CHOL 131 07/15/2019   HDL 63 07/15/2019   LDLCALC 44 07/15/2019   TRIG 120 07/15/2019    Significant Diagnostic Results in last 30 days:  No results found.  Assessment/Plan 1. Orthostatic hypotension Controlled with midodrine 5 mg bid   2. Dementia due to Parkinson's disease without behavioral disturbance ([place]) Mild  MMSE 6/30 Continues to benefit from Aricept 10 mg qd   3. Parkinson disease ([place]) Followed by neurology Currently on sinemet  4. Mixed obsessional thoughts and acts Followed by Dr. Casimiro Needle  Modest benefit noted from Luvox as this is a difficult set of symptoms to treat  5. Chronic diastolic CHF (congestive heart failure) (HCC) Compensated Continues torsemide 20 mg qd   6. Coronary artery disease involving native coronary artery of native heart without angina pectoris Lab Results  Component Value Date   LDLCALC 44 07/15/2019   With prior stent placement Continues on lipitor and asa     Labs/tests ordered:  NA

## 2020-05-29 ENCOUNTER — Encounter: Payer: Self-pay | Admitting: Internal Medicine

## 2020-06-02 ENCOUNTER — Non-Acute Institutional Stay (SKILLED_NURSING_FACILITY): Payer: PPO | Admitting: Adult Health

## 2020-06-02 ENCOUNTER — Encounter: Payer: Self-pay | Admitting: Adult Health

## 2020-06-02 DIAGNOSIS — I5032 Chronic diastolic (congestive) heart failure: Secondary | ICD-10-CM

## 2020-06-02 DIAGNOSIS — F0280 Dementia in other diseases classified elsewhere without behavioral disturbance: Secondary | ICD-10-CM | POA: Diagnosis not present

## 2020-06-02 DIAGNOSIS — G20 Parkinson's disease: Secondary | ICD-10-CM | POA: Diagnosis not present

## 2020-06-02 DIAGNOSIS — K219 Gastro-esophageal reflux disease without esophagitis: Secondary | ICD-10-CM | POA: Diagnosis not present

## 2020-06-02 DIAGNOSIS — F422 Mixed obsessional thoughts and acts: Secondary | ICD-10-CM

## 2020-06-02 DIAGNOSIS — E782 Mixed hyperlipidemia: Secondary | ICD-10-CM

## 2020-06-02 NOTE — Progress Notes (Signed)
Location:  [facility] Room Number: 536-R Place of Service:  SNF [id number]) Provider: Cindi Carbon, NP     Patient Care Team: Gayland Curry, DO as PCP - General (Geriatric Medicine) [name], [name], MD as PCP - Cardiology (Cardiology) [name], [name], MD as Consulting Physician (Cardiology) [name], MD as Attending Physician (Gastroenterology) [name], MD as Consulting Physician (Neurology) Community, [facility] ([place]) Royal Hawthorn, NP as Nurse Practitioner (Nurse Practitioner)  Extended Emergency Contact Information Primary Emergency Contact: [name] [name] Address: [address] [address] [place]          [place], [place] [address] [name] of [place] Phone: [phone] Mobile Phone: [phone] Relation: Spouse  Code Status:  DNR  Goals of care: Advanced Directive information Advanced Directives 06/02/2020  Does Patient Have a Medical Advance Directive? Yes  Type of Paramedic of [place];Living will;Out of facility DNR (pink MOST or yellow form)  Does patient want to make changes to medical advance directive? No - Patient declined  Copy of [place] in Chart? Yes - validated most recent copy scanned in chart (See row information)  Pre-existing out of facility DNR order (yellow form or pink MOST form) Yellow form placed in chart (order not valid for inpatient use)     Chief Complaint  Patient presents with  . Medical Management of Chronic Issues    Routine visit and discuss need for MALB or exclude     HPI:  Pt is a 87 y.o. male seen today for medical management of chronic diseases.    [name] [name] has no acute complaints for the visit. He continues to have cleaning rituals.  He is not having any expressed feelings of anxiety. No difficulty sleeping. Appetite is adequate.   Diastolic CHF: no issues with sob, doe, or pnd. Has mild chronic  edema.   BP controlled  He continues to be able to operate his PWC and participate in Activities  MMSE 05/05/20 29/30   Past Medical History:  Diagnosis Date  . Cellulitis   . Chronic diastolic CHF (congestive heart failure) (HCC)    a. EF initially 35-40% after MI 1/09; b. echo 7/08: EF 60%;  c. 11/2014 Echo: EF 65-70%, Gr 1 DD, mild MR.  . Coronary artery disease    a. s/p anterior STEMI 04/2007 with BMS-> LAD;  b. Cath 12/2011 patent stent;  c. low risk nuc 04/2014.  . Crohn's disease ([place])   . Diaphragmatic hernia without mention of obstruction or gangrene   . Diverticulosis of colon (without mention of hemorrhage)   . Fatty liver    a. on ultrasound of 10/2009  . Flatulence, eructation, and gas pain   . Fungal infection   . Gait disorder   . GERD (gastroesophageal reflux disease)   . HOH (hard of hearing)    Hearing aids  . Hyperlipidemia   . Hypertension   . Ischemic cardiomyopathy    a. EF initially 35-40% after MI 1/09; b. echo 7/08: EF 60%;  c. 11/2014 Echo: EF 65-70%, Gr 1 DD, mild MR.  . Melanoma ([place])   . Memory disorder   . Orthostatic hypotension   . Osteoporosis   . Other chronic nonalcoholic liver disease   . Other esophagitis   . Parkinson's disease ([place])   . RBBB 09/02/2013  . Regional enteritis of small intestine ([place])   . Renal calculi   . Restless leg syndrome 03/22/2015  .  Ulcerative (chronic) ileocolitis ([place])   . Urinary incontinence    Past Surgical History:  Procedure Laterality Date  . APPENDECTOMY    . BACK SURGERY     L3, L4, L5  . CARDIAC CATHETERIZATION  09/25/06   EF 35-40% but more recently 60%  . CATARACT EXTRACTION     Bilateral  . CHOLECYSTECTOMY    . Coronary artery stent placement    . Melanoma resection    . Partial bowel resection    . TONSILLECTOMY      No Known Allergies  Outpatient Encounter Medications as of 06/02/2020  Medication Sig  . acetaminophen (TYLENOL) 325 MG tablet Take 650 mg by mouth at bedtime. And q 6 hrs  prn  . aspirin EC 81 MG tablet Take 81 mg by mouth daily.  Marland Kitchen atorvastatin (LIPITOR) 40 MG tablet Take 40 mg by mouth daily.  . calcium carbonate (OS-CAL) 600 MG TABS tablet Take 600 mg by mouth daily with breakfast.  . carbidopa-levodopa (SINEMET CR) 50-200 MG tablet Take 1 tablet by mouth 4 (four) times daily.  . carbidopa-levodopa (SINEMET IR) 25-100 MG tablet Take 1 tablet by mouth 4 (four) times daily.  . Cholecalciferol (VITAMIN D3) 50 MCG (2000 UT) TABS Take 1 tablet by mouth daily.  . cholestyramine (QUESTRAN) 4 g packet TAKE 1 PACKET BY MOUTH TWICE DAILY WITH A MEAL  . donepezil (ARICEPT) 10 MG tablet TAKE ONE TABLET AT BEDTIME  . fluvoxaMINE (LUVOX) 50 MG tablet Take 50 mg by mouth at bedtime.  Marland Kitchen ipratropium (ATROVENT) 0.03 % nasal spray Place 2 sprays into both nostrils 3 (three) times daily.  . Melatonin 5 MG TABS Take 1 tablet by mouth at bedtime.   . midodrine (PROAMATINE) 5 MG tablet Take 1 tablet (5 mg total) by mouth 2 (two) times daily with a meal.  . nitroGLYCERIN (NITROSTAT) 0.4 MG SL tablet Place 1 tablet (0.4 mg total) under the tongue every 5 (five) minutes as needed for chest pain. PT OVERDUE FOR OV PLEASE CALL FOR APPT  . pantoprazole (PROTONIX) 40 MG tablet Take 40 mg by mouth daily.  . potassium chloride SA (K-DUR,KLOR-CON) 20 MEQ tablet Take 40 mEq by mouth daily.  . Probiotic Product (ALIGN) 4 MG CAPS Take 1 capsule by mouth daily.   . sodium chloride (OCEAN) 0.65 % SOLN nasal spray Place 2 sprays into both nostrils daily as needed for congestion.   . torsemide (DEMADEX) 20 MG tablet Take 1 tablet (20 mg total) by mouth daily.  . vitamin B-12 (CYANOCOBALAMIN) 1000 MCG tablet Take 1,000 mcg by mouth daily.   No facility-administered encounter medications on file as of 06/02/2020.    Review of Systems  Constitutional: Negative for activity change, appetite change, chills, diaphoresis, fatigue, fever and unexpected weight change.  Respiratory: Negative for cough,  shortness of breath, wheezing and stridor.   Cardiovascular: Positive for leg swelling. Negative for chest pain and palpitations.  Gastrointestinal: Negative for abdominal distention, abdominal pain, constipation and diarrhea.  Genitourinary: Negative for difficulty urinating and dysuria.  Musculoskeletal: Positive for gait problem. Negative for arthralgias, back pain, joint swelling and myalgias.  Skin: Positive for wound (skin tear ).  Neurological: Negative for dizziness, seizures, syncope, facial asymmetry, speech difficulty, weakness and headaches.  Hematological: Negative for adenopathy. Does not bruise/bleed easily.  Psychiatric/Behavioral: Negative for agitation, behavioral problems and confusion.       Cleaning rituals    Immunization History  Administered Date(s) Administered  . Influenza, High Dose Seasonal PF  01/21/2019  . Influenza-Unspecified 12/06/2014, 11/27/2017, 02/01/2020  . Moderna Sars-Covid-2 Vaccination 04/13/2019, 05/11/2019, 02/18/2020  . Pneumococcal Conjugate-13 05/21/2018  . Pneumococcal Polysaccharide-23 05/21/2005  . Tdap 06/03/2006, 10/22/2018  . Zoster Recombinat (Shingrix) 10/22/2018, 12/24/2018   Pertinent  Health Maintenance Due  Topic Date Due  . INFLUENZA VACCINE  Completed  . PNA vac Low Risk Adult  Completed   Fall Risk  11/11/2019 07/20/2019 10/22/2018 05/12/2018 10/03/2017  Falls in the past year? 1 - 1 1 No  Number falls in past yr: 0 - 1 1 -  Injury with Fall? 1 - 1 0 -  Risk for fall due to : History of fall(s);Impaired balance/gait History of fall(s);Impaired balance/gait;Impaired mobility;Mental status change;Medication side effect History of fall(s);Impaired mobility Impaired mobility;History of fall(s);Impaired balance/gait;Mental status change;Medication side effect -  Follow up Falls evaluation completed Education provided;Falls prevention discussed;Falls evaluation completed Falls evaluation completed;Falls prevention discussed Falls  evaluation completed;Falls prevention discussed;Education provided -  Comment - at [place] - - -   Functional Status Survey:    Vitals:   06/02/20 0930  BP: 132/74  Pulse: 94  Resp: 16  Temp: (!) 97.5 F (36.4 C)  TempSrc: Temporal  SpO2: 93%  Weight: 176 lb (79.8 kg)  Height: 5' 9"  (1.753 m)   Body mass index is 25.99 kg/m. Physical Exam Vitals and nursing note reviewed.  Constitutional:      General: He is not in acute distress.    Appearance: He is not diaphoretic.  HENT:     Head: Normocephalic and atraumatic.  Neck:     Thyroid: No thyromegaly.     Vascular: No JVD.     Trachea: No tracheal deviation.  Cardiovascular:     Rate and Rhythm: Normal rate and regular rhythm.     Heart sounds: No murmur heard.   Pulmonary:     Effort: Pulmonary effort is normal. No respiratory distress.     Breath sounds: Normal breath sounds. No wheezing.  Abdominal:     General: Bowel sounds are normal. There is no distension.     Palpations: Abdomen is soft.     Tenderness: There is no abdominal tenderness.  Musculoskeletal:     Right lower leg: Edema (+1) present.     Left lower leg: Edema (+1) present.     Comments: No rigidity noted to BUE.  No tremor noted.   Lymphadenopathy:     Cervical: No cervical adenopathy.  Skin:    General: Skin is warm and dry.  Neurological:     Mental Status: He is alert and oriented to person, place, and time.     Cranial Nerves: No cranial nerve deficit.  Psychiatric:        Mood and Affect: Mood and affect and mood normal.     Labs reviewed: Recent Labs    07/15/19 0000  NA 138  K 3.7  CL 97*  CO2 24*  BUN 16  CREATININE 0.9  CALCIUM 8.9   Recent Labs    07/15/19 0000  ALBUMIN 4.7   Recent Labs    08/19/19 0000 10/12/19 0000  WBC 3.1 8.1  NEUTROABS  --  5  HGB 13.5 13.6  HCT 41 40*  PLT 138* 241   Lab Results  Component Value Date   TSH 5.09 07/15/2019   No results found for: HGBA1C Lab Results   Component Value Date   CHOL 131 07/15/2019   HDL 63 07/15/2019   LDLCALC 44 07/15/2019   TRIG 120  07/15/2019    Significant Diagnostic Results in last 30 days:  No results found.  Assessment/Plan  1. Parkinson disease [facility]) Doing well on his current Sinemet regimen prescribed by neurology  2. Dementia due to Parkinson's disease without behavioral disturbance (HCC) Mild Continue Aricept 10 mg qd  3. Chronic diastolic CHF (congestive heart failure) (HCC) Compensated Continue Torsemide 20 mg qd   4. Mixed obsessional thoughts and acts Followed by Dr. Casimiro Needle Symptoms persists but likely we are seeing the most benefit possible from his current regimen  5. Gastroesophageal reflux disease without esophagitis No current symptoms Continue Protonix 40 mg qd  6. Mixed hyperlipidemia Lab Results  Component Value Date   LDLCALC 44 07/15/2019  Continue Lipitor 40 mg qd   Family/ staff Communication: resident/nurse   Labs/tests ordered:  NA

## 2020-06-10 NOTE — Progress Notes (Signed)
Cardiology Office Note Date:  06/12/2020  Patient ID:  [name] [name], [name] [name] 1929-04-04, MRN [id number] PCP:  Gayland Curry, DO  Cardiologist:  Dr. [name]   Chief Complaint: CAD  History of Present Illness: [name] [name] is a 87 y.o. male with history of CAD (s/p anterior STEMI 04/2007 with BMS-> LAD, patent by cath in 12/2011, low risk nuc 04/2014), chronic dCHF, HTN, HLD, Crohn's disease, GERD, Parkinson's disease, cellulitis, orthostatic hypotension requiring midodrine, GERD, RBBB, fatty liver, gait disorder, chronic lower extremity edema who presents for follow up.   He was seen in the ED in May 2019 with left leg cellulitis. In September 2019 he was admitted with right leg cellulitis having failed outpatient antibiotics. Dopplers negative for DVT.   He is followed by Dr Jannifer Franklin for his Parkinson's disease and memory loss.   He is seen with his wife today. He is  in skilled nursing at [facility]. He is doing really well. He is very active in things he can do in his wheelchair. he still gets PT twice a week. No dizziness or syncope.   Denies any chest pain, dyspnea,  fever.  He does note some mild LE edema. Wears support hose. Doesn't always eat the right things.    Past Medical History:  Diagnosis Date   Cellulitis    Chronic diastolic CHF (congestive heart failure) ([place])    a. EF initially 35-40% after MI 1/09; b. echo 7/08: EF 60%;  c. 11/2014 Echo: EF 65-70%, Gr 1 DD, mild MR.   Coronary artery disease    a. s/p anterior STEMI 04/2007 with BMS-> LAD;  b. Cath 12/2011 patent stent;  c. low risk nuc 04/2014.   Crohn's disease ([place])    Diaphragmatic hernia without mention of obstruction or gangrene    Diverticulosis of colon (without mention of hemorrhage)    Fatty liver    a. on ultrasound of 10/2009   Flatulence, eructation, and gas pain    Fungal infection    Gait disorder    GERD (gastroesophageal reflux disease)    HOH (hard of hearing)    Hearing aids    Hyperlipidemia    Hypertension    Ischemic cardiomyopathy    a. EF initially 35-40% after MI 1/09; b. echo 7/08: EF 60%;  c. 11/2014 Echo: EF 65-70%, Gr 1 DD, mild MR.   Melanoma ([place])    Memory disorder    Orthostatic hypotension    Osteoporosis    Other chronic nonalcoholic liver disease    Other esophagitis    Parkinson's disease ([place])    RBBB 09/02/2013   Regional enteritis of small intestine (HCC)    Renal calculi    Restless leg syndrome 03/22/2015   Ulcerative (chronic) ileocolitis (HCC)    Urinary incontinence     Past Surgical History:  Procedure Laterality Date   APPENDECTOMY     BACK SURGERY     L3, L4, L5   CARDIAC CATHETERIZATION  09/25/06   EF 35-40% but more recently 60%   CATARACT EXTRACTION     Bilateral   CHOLECYSTECTOMY     Coronary artery stent placement     Melanoma resection     Partial bowel resection     TONSILLECTOMY      Current Outpatient Medications  Medication Sig Dispense Refill   acetaminophen (TYLENOL) 325 MG tablet Take 650 mg by mouth at bedtime. And q 6 hrs prn     aspirin EC 81 MG tablet Take  81 mg by mouth daily.     atorvastatin (LIPITOR) 40 MG tablet Take 40 mg by mouth daily.     calcium carbonate (OS-CAL) 600 MG TABS tablet Take 600 mg by mouth daily with breakfast.     carbidopa-levodopa (SINEMET CR) 50-200 MG tablet Take 1 tablet by mouth 4 (four) times daily. 120 tablet 5   carbidopa-levodopa (SINEMET IR) 25-100 MG tablet Take 1 tablet by mouth 4 (four) times daily.     Cholecalciferol (VITAMIN D3) 50 MCG (2000 UT) TABS Take 1 tablet by mouth daily.     cholestyramine (QUESTRAN) 4 g packet TAKE 1 PACKET BY MOUTH TWICE DAILY WITH A MEAL 60 each 11   donepezil (ARICEPT) 10 MG tablet TAKE ONE TABLET AT BEDTIME 90 tablet 0   ipratropium (ATROVENT) 0.03 % nasal spray Place 2 sprays into both nostrils 3 (three) times daily.     Melatonin 5 MG TABS Take 1 tablet by mouth at bedtime.      midodrine  (PROAMATINE) 5 MG tablet Take 1 tablet (5 mg total) by mouth 2 (two) times daily with a meal. 60 tablet 9   nitroGLYCERIN (NITROSTAT) 0.4 MG SL tablet Place 1 tablet (0.4 mg total) under the tongue every 5 (five) minutes as needed for chest pain. PT OVERDUE FOR OV PLEASE CALL FOR APPT 25 tablet 0   pantoprazole (PROTONIX) 40 MG tablet Take 40 mg by mouth daily.     potassium chloride SA (K-DUR,KLOR-CON) 20 MEQ tablet Take 40 mEq by mouth daily.     Probiotic Product (ALIGN) 4 MG CAPS Take 1 capsule by mouth daily.      sodium chloride (OCEAN) 0.65 % SOLN nasal spray Place 2 sprays into both nostrils daily as needed for congestion.      torsemide (DEMADEX) 20 MG tablet Take 1 tablet (20 mg total) by mouth daily. 180 tablet 3   vitamin B-12 (CYANOCOBALAMIN) 1000 MCG tablet Take 1,000 mcg by mouth daily.     No current facility-administered medications for this visit.    Allergies:   Patient has no known allergies.   Social History:  The patient  reports that he has never smoked. He has never used smokeless tobacco. He reports current alcohol use of about 2.0 - 3.0 standard drinks of alcohol per week. He reports that he does not use drugs.   Family History:  The patient's family history includes Colon cancer in his brother; Coronary artery disease in an other family member; Emphysema in his father; Heart attack in his mother; Heart disease in his brother and brother; Heart failure in his mother; Hypertension in his mother.  ROS:  Please see the history of present illness.   All other systems are reviewed and otherwise negative.   PHYSICAL EXAM:  VS:  BP 137/75    Pulse 86    Ht 5' 9"  (1.753 m)    Wt 186 lb (84.4 kg)    SpO2 99%    BMI 27.47 kg/m  BMI: Body mass index is 27.47 kg/m. GENERAL:  Well appearing, elderly WM in NAD, seen in a wheelchair.  HEENT:  PERRL, EOMI, sclera are clear. Oropharynx is clear. NECK:  No jugular venous distention, carotid upstroke brisk and symmetric, no  bruits, no thyromegaly or adenopathy LUNGS:  Clear to auscultation bilaterally CHEST:  Unremarkable HEART:  RRR,  PMI not displaced or sustained,S1 and S2 within normal limits, no S3, no S4: no clicks, no rubs, no murmurs ABD:  Soft, nontender. Distended.  BS +, no masses or bruits. No hepatomegaly, no splenomegaly EXT:  2 + pulses throughout, 1+ RLE  Edema, trace on left., no cyanosis no clubbing SKIN:  Warm and dry.  No rashes NEURO:  Alert and oriented x 3. Cranial nerves II through XII intact. PSYCH:  Cognitively intact  EKG:  Is not done today.     Recent Labs: 07/15/2019: BUN 16; Creatinine 0.9; Potassium 3.7; Sodium 138; TSH 5.09 10/12/2019: Hemoglobin 13.6; Platelets 241  07/15/2019: Cholesterol 131; HDL 63; LDL Cholesterol 44; Triglycerides 120   CrCl cannot be calculated (Patient's most recent lab result is older than the maximum 21 days allowed.).    Wt Readings from Last 3 Encounters:  06/12/20 186 lb (84.4 kg)  06/02/20 176 lb (79.8 kg)  04/20/20 180 lb (81.6 kg)     Other studies reviewed: Additional studies/records reviewed today include:  Echo 11/14/15: Study Conclusions  - Left ventricle: The cavity size was normal. There was mild focal   basal and mild concentric hypertrophy of the septum. Systolic   function was vigorous. The estimated ejection fraction was in the   range of 65% to 70%. Wall motion was normal; there were no   regional wall motion abnormalities. Doppler parameters are   consistent with abnormal left ventricular relaxation (grade 1   diastolic dysfunction). - Mitral valve: There was mild regurgitation.  Impressions:  - Compared to the prior study, there has been no significant   interval change.  ASSESSMENT AND PLAN:  1. Chronic diastolic CHF -well compensated. He does have mild edema R>L.  Recommend continuing torsemide 20 mg daily. Restrict salt intake more. Continue compression hose. Keep feet elevated when possible. Will update labs  including CBC, CMET, TSH and lipids.  2. History of essential HTN with orthostatic hypotension. BP well controlled on no antihypertensive therapy. On midodrine bid for orthostasis.  3. CAD s/p anterior MI 2009 with BMS to LAD. Cath 2013 showed patent stent. Normal Myoview Jan 2016.  Asymptomatic. 4.   Parkinson's disease- per Neurology   Disposition: F/u in 6 months  Current medicines are reviewed at length with the patient today.  The patient did not have any concerns regarding medicines.  Signed, Roselynn Whitacre [name] MD, [facility]    06/12/2020 4:07 PM     [facility]

## 2020-06-12 ENCOUNTER — Encounter: Payer: Self-pay | Admitting: Cardiology

## 2020-06-12 ENCOUNTER — Other Ambulatory Visit: Payer: Self-pay

## 2020-06-12 ENCOUNTER — Ambulatory Visit: Payer: PPO | Admitting: Cardiology

## 2020-06-12 VITALS — BP 137/75 | HR 86 | Ht 69.0 in | Wt 186.0 lb

## 2020-06-12 DIAGNOSIS — E782 Mixed hyperlipidemia: Secondary | ICD-10-CM | POA: Diagnosis not present

## 2020-06-12 DIAGNOSIS — I5032 Chronic diastolic (congestive) heart failure: Secondary | ICD-10-CM

## 2020-06-12 DIAGNOSIS — I10 Essential (primary) hypertension: Secondary | ICD-10-CM

## 2020-06-12 DIAGNOSIS — I2510 Atherosclerotic heart disease of native coronary artery without angina pectoris: Secondary | ICD-10-CM | POA: Diagnosis not present

## 2020-06-12 NOTE — Patient Instructions (Signed)
Continue your current therapy  You need follow up lab work next month

## 2020-06-15 ENCOUNTER — Ambulatory Visit: Payer: PPO | Admitting: Neurology

## 2020-06-15 ENCOUNTER — Encounter: Payer: Self-pay | Admitting: Neurology

## 2020-06-15 VITALS — BP 107/67 | HR 96

## 2020-06-15 DIAGNOSIS — R269 Unspecified abnormalities of gait and mobility: Secondary | ICD-10-CM | POA: Diagnosis not present

## 2020-06-15 DIAGNOSIS — G20A1 Parkinson's disease without dyskinesia, without mention of fluctuations: Secondary | ICD-10-CM

## 2020-06-15 DIAGNOSIS — G20 Parkinson's disease: Secondary | ICD-10-CM

## 2020-06-15 NOTE — Patient Instructions (Signed)
Continue current medications Continue exercise, physical therapy See you back in 5-6 months or sooner if needed

## 2020-06-15 NOTE — Progress Notes (Signed)
PATIENT: [name] [name] DOB: [birth year]  REASON FOR VISIT: follow up HISTORY FROM: patient  HISTORY OF PRESENT ILLNESS: Today 06/15/20  [name] [name] is an 87 year old male with history of Parkinson's disease.  He has OCD behavior, focused on cleaning rituals, hygiene, "cleanest man at [facility]".  He is no longer on Zoloft tapered off, started Luvox on 03/13/20.  He lives at [facility] in skilled care. He is on Sinemet. He can walk short distances with belt and assistance. Doing PT twice a week, makes him feel better. Fell last night getting into bed on washcloth, isn't supposed to get up without assistance. Eating well, not choked. Sleeps well, no hallucinations. No tremor or rigidity. No fainting events. Here with his wife, has no complaints, MMSE 23/30.   HISTORY 09/30/2019 Dr. Jannifer [name]: [name] [name] is an 87 year old right-handed white male with a history of Parkinson's disease.  He has had a decline in his overall functioning level over time, he still gets physical therapy at least twice a week, he is able to walk short distances with a walker.  He has not had any falls and he is good about asking for help before he tries to walk.  He has had some OCD behavior with frequent bathing and washing, wanting to go outside frequently.  They have initiated Zoloft at 25 mg daily but the family wanted a sign off from our office before they continue to use the medication.  The patient has not had any troubles with dizziness with standing, he has not had any fainting episodes.  He is swallowing fairly well without choking.  He sleeps well at night, he denies any hallucinations.  He remains on Sinemet CR 50/200 mg tablet, he takes 1 tablet 4 times daily.  He takes 1 full Sinemet IR tablet 25/100 mg taking 1 tablet 4 times a day with the CR.  The patient returns to this office for an evaluation.   REVIEW OF SYSTEMS: Out of a complete 14 system review of symptoms, the patient complains only of the following  symptoms, and all other reviewed systems are negative.  See HPI  ALLERGIES: No Known Allergies  HOME MEDICATIONS: Outpatient Medications Prior to Visit  Medication Sig Dispense Refill  . acetaminophen (TYLENOL) 325 MG tablet Take 650 mg by mouth at bedtime. And q 6 hrs prn    . aspirin EC 81 MG tablet Take 81 mg by mouth daily.    Marland Kitchen atorvastatin (LIPITOR) 40 MG tablet Take 40 mg by mouth daily.    . calcium carbonate (OS-CAL) 600 MG TABS tablet Take 600 mg by mouth daily with breakfast.    . carbidopa-levodopa (SINEMET CR) 50-200 MG tablet Take 1 tablet by mouth 4 (four) times daily. 120 tablet 5  . carbidopa-levodopa (SINEMET IR) 25-100 MG tablet Take 1 tablet by mouth 4 (four) times daily.    . Cholecalciferol (VITAMIN D3) 50 MCG (2000 UT) TABS Take 1 tablet by mouth daily.    . cholestyramine (QUESTRAN) 4 g packet TAKE 1 PACKET BY MOUTH TWICE DAILY WITH A MEAL 60 each 11  . donepezil (ARICEPT) 10 MG tablet TAKE ONE TABLET AT BEDTIME 90 tablet 0  . fluvoxaMINE (LUVOX) 50 MG tablet Take 50 mg by mouth at bedtime.    Marland Kitchen ipratropium (ATROVENT) 0.03 % nasal spray Place 2 sprays into both nostrils 3 (three) times daily.    . Melatonin 5 MG TABS Take 1 tablet by mouth at bedtime.     . midodrine (  PROAMATINE) 5 MG tablet Take 1 tablet (5 mg total) by mouth 2 (two) times daily with a meal. 60 tablet 9  . nitroGLYCERIN (NITROSTAT) 0.4 MG SL tablet Place 1 tablet (0.4 mg total) under the tongue every 5 (five) minutes as needed for chest pain. PT OVERDUE FOR OV PLEASE CALL FOR APPT 25 tablet 0  . pantoprazole (PROTONIX) 40 MG tablet Take 40 mg by mouth daily.    . potassium chloride SA (K-DUR,KLOR-CON) 20 MEQ tablet Take 40 mEq by mouth daily.    . Probiotic Product (ALIGN) 4 MG CAPS Take 1 capsule by mouth daily.     . sodium chloride (OCEAN) 0.65 % SOLN nasal spray Place 2 sprays into both nostrils daily as needed for congestion.     . torsemide (DEMADEX) 20 MG tablet Take 1 tablet (20 mg total)  by mouth daily. 180 tablet 3  . vitamin B-12 (CYANOCOBALAMIN) 1000 MCG tablet Take 1,000 mcg by mouth daily.     No facility-administered medications prior to visit.    PAST MEDICAL HISTORY: Past Medical History:  Diagnosis Date  . Cellulitis   . Chronic diastolic CHF (congestive heart failure) (HCC)    a. EF initially 35-40% after MI 1/09; b. echo 7/08: EF 60%;  c. 11/2014 Echo: EF 65-70%, Gr 1 DD, mild MR.  . Coronary artery disease    a. s/p anterior STEMI 04/2007 with BMS-> LAD;  b. Cath 12/2011 patent stent;  c. low risk nuc 04/2014.  . Crohn's disease ([place])   . Diaphragmatic hernia without mention of obstruction or gangrene   . Diverticulosis of colon (without mention of hemorrhage)   . Fatty liver    a. on ultrasound of 10/2009  . Flatulence, eructation, and gas pain   . Fungal infection   . Gait disorder   . GERD (gastroesophageal reflux disease)   . HOH (hard of hearing)    Hearing aids  . Hyperlipidemia   . Hypertension   . Ischemic cardiomyopathy    a. EF initially 35-40% after MI 1/09; b. echo 7/08: EF 60%;  c. 11/2014 Echo: EF 65-70%, Gr 1 DD, mild MR.  . Melanoma ([place])   . Memory disorder   . Orthostatic hypotension   . Osteoporosis   . Other chronic nonalcoholic liver disease   . Other esophagitis   . Parkinson's disease ([place])   . RBBB 09/02/2013  . Regional enteritis of small intestine ([place])   . Renal calculi   . Restless leg syndrome 03/22/2015  . 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 Urinary incontinence     PAST SURGICAL HISTORY: Past Surgical History:  Procedure Laterality Date  . APPENDECTOMY    . BACK SURGERY     L3, L4, L5  . CARDIAC CATHETERIZATION  09/25/06   EF 35-40% but more recently 60%  . CATARACT EXTRACTION     Bilateral  . CHOLECYSTECTOMY    . Coronary artery stent placement    . Melanoma resection    . Partial bowel resection    . TONSILLECTOMY      FAMILY HISTORY: Family History  Problem Relation Age of Onset  . Heart  failure Mother   . Heart attack Mother   . Hypertension Mother   . Emphysema Father   . Heart disease Brother   . Heart disease Brother   . Coronary artery disease Other   . Colon cancer Brother        older at dx  . Stomach cancer Neg  Hx     SOCIAL HISTORY: Social History   Socioeconomic History  . Marital status: Married    Spouse name: [name]  . Number of children: 3  . Years of education: 16  . Highest education level: Bachelor's degree (e.g., BA, AB, BS)  Occupational History  . Occupation: CEO-retired    Employer: [employer]  . Occupation: Licensed conveyancer: [employer]  Tobacco Use  . Smoking status: Never Smoker  . Smokeless tobacco: Never Used  Vaping Use  . Vaping Use: Never used  Substance and Sexual Activity  . Alcohol use: Yes    Alcohol/week: 2.0 - 3.0 standard drinks    Types: 2 - 3 Standard drinks or equivalent per week    Comment: occassionally  . Drug use: No  . Sexual activity: Not Currently  Other Topics Concern  . Not on file  Social History Narrative   Lives w/ his wife at [facility]   Patient is right handed.   Patient drinks very little caffeine.   Social Determinants of Health   Financial Resource Strain: Not on file  Food Insecurity: No Food Insecurity  . Worried About Charity fundraiser in the Last Year: Never true  . Ran Out of Food in the Last Year: Never true  Transportation Needs: Not on file  Physical Activity: Unknown  . Days of Exercise per Week: 3 days  . Minutes of Exercise per Session: Not on file  Stress: Not on file  Social Connections: Not on file  Intimate Partner Violence: Not on file    PHYSICAL EXAM  Vitals:   06/15/20 1340  BP: 107/67  Pulse: 96   There is no height or weight on file to calculate BMI.  Generalized: Well developed, in no acute distress  MMSE - Mini Mental State Exam 06/15/2020 11/11/2019 09/30/2019  Not completed: - - -  Orientation to time 3 5 5    Orientation to Place 5 4 5   Registration 3 3 3   Attention/ Calculation 1 3 2   Recall 2 2 3   Language- name 2 objects 2 2 2   Language- repeat 1 1 1   Language- follow 3 step command 3 3 3   Language- read & follow direction 1 1 1   Write a sentence 1 1 1   Copy design 1 1 1   Total score 23 26 27     Neurological examination  Mentation: Alert oriented to time, place, history taking. Follows all commands speech and language fluent, pleasant, cooperative Cranial nerve II-XII: Pupils were equal round reactive to light. Extraocular movements were full, visual field were full on confrontational test. Facial sensation and strength were normal. Head turning and shoulder shrug  were normal and symmetric. Motor: Good strength in all extremities, no rigidity noted Sensory: Sensory testing is intact to soft touch on all 4 extremities. No evidence of extinction is noted.  Coordination: Cerebellar testing reveals good finger-nose-finger and heel-to-shin bilaterally.  Gait and station: Has to push off from seated position to stand with fair ease, but standing, can take a few steps with 2 standby assist, moderate stride, forward leaning Reflexes: Deep tendon reflexes are symmetric  DIAGNOSTIC DATA (LABS, IMAGING, TESTING) - I reviewed patient records, labs, notes, testing and imaging myself where available.  Lab Results  Component Value Date   WBC 8.1 10/12/2019   HGB 13.6 10/12/2019   HCT 40 (A) 10/12/2019   MCV 92.7 02/09/2018   PLT 241 10/12/2019      Component Value  Date/Time   NA 138 07/15/2019 0000   K 3.7 07/15/2019 0000   CL 97 (A) 07/15/2019 0000   CO2 24 (A) 07/15/2019 0000   GLUCOSE 127 (H) 02/09/2018 1251   BUN 16 07/15/2019 0000   CREATININE 0.9 07/15/2019 0000   CREATININE 1.26 02/09/2018 1251   CREATININE 0.94 04/05/2015 1051   CALCIUM 8.9 07/15/2019 0000   PROT 6.7 12/18/2017 0533   PROT 6.8 12/13/2014 0846   PROT 6.7 12/13/2014 0846   ALBUMIN 4.7 07/15/2019 0000    ALBUMIN 4.1 12/13/2014 0846   ALBUMIN 4.1 12/13/2014 0846   AST 48 (A) 08/24/2018 0000   ALT 23 08/24/2018 0000   ALKPHOS 88 08/24/2018 0000   BILITOT 1.8 (H) 12/18/2017 0533   BILITOT 0.9 12/13/2014 0846   BILITOT 0.7 12/13/2014 0846   GFRNONAA >60 12/20/2017 0522   GFRAA >60 12/20/2017 0522   Lab Results  Component Value Date   CHOL 131 07/15/2019   HDL 63 07/15/2019   LDLCALC 44 07/15/2019   TRIG 120 07/15/2019   No results found for: HGBA1C Lab Results  Component Value Date   VITAMINB12 469 12/13/2014   Lab Results  Component Value Date   TSH 5.09 07/15/2019   ASSESSMENT AND PLAN 87 y.o. year old male  has a past medical history of Cellulitis, Chronic diastolic CHF (congestive heart failure) ([place]), Coronary artery disease, Crohn's disease ([place]), Diaphragmatic hernia without mention of obstruction or gangrene, Diverticulosis of colon (without mention of hemorrhage), Fatty liver, Flatulence, eructation, and gas pain, Fungal infection, Gait disorder, GERD (gastroesophageal reflux disease), HOH (hard of hearing), Hyperlipidemia, Hypertension, Ischemic cardiomyopathy, Melanoma ([place]), Memory disorder, Orthostatic hypotension, Osteoporosis, Other chronic nonalcoholic liver disease, Other esophagitis, Parkinson's disease ([place]), RBBB (09/02/2013), Regional enteritis of small intestine ([place]), Renal calculi, Restless leg syndrome (03/22/2015), 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 here with:  1.  Parkinson's disease 2.  Mild memory disturbance 3.  Gait disturbance 4.  OCD behavior  -Seems to be overall stable, cooperative, in good spirits today with his wife -Now off Zoloft, on Luvox, OCD continues, but is not particularly problematic -Continue Sinemet at current dosing -Encouraged to continue physical therapy, daily exercise activity -1 fall last night, reminded not to get up without assistance -Follow-up in 5 to 6 months or sooner if needed  I spent 30  minutes of face-to-face and non-face-to-face time with patient.  This included previsit chart review, lab review, study review, order entry, electronic health record documentation, patient education.  Butler Denmark, AGNP-C, DNP 06/15/2020, 1:59 PM [facility] [address], [place], [place] [address] [phone]

## 2020-06-18 NOTE — Progress Notes (Signed)
I have read the note, and I agree with the clinical assessment and plan.  Charles K Willis   

## 2020-07-13 DIAGNOSIS — E785 Hyperlipidemia, unspecified: Secondary | ICD-10-CM | POA: Diagnosis not present

## 2020-07-13 LAB — HEPATIC FUNCTION PANEL
ALT: 8 — AB (ref 10–40)
AST: 33 (ref 14–40)
Alkaline Phosphatase: 83 (ref 25–125)
Bilirubin, Total: 1.3

## 2020-07-13 LAB — CBC AND DIFFERENTIAL
HCT: 37 — AB (ref 41–53)
Hemoglobin: 12.5 — AB (ref 13.5–17.5)
Platelets: 232 (ref 150–399)
WBC: 7.4

## 2020-07-13 LAB — BASIC METABOLIC PANEL
BUN: 20 (ref 4–21)
CO2: 24 — AB (ref 13–22)
Chloride: 100 (ref 99–108)
Creatinine: 1.1 (ref 0.6–1.3)
Glucose: 120
Potassium: 3.6 (ref 3.4–5.3)
Sodium: 139 (ref 137–147)

## 2020-07-13 LAB — LIPID PANEL
Cholesterol: 125 (ref 0–200)
HDL: 60 (ref 35–70)
LDL Cholesterol: 45
Triglycerides: 100 (ref 40–160)

## 2020-07-13 LAB — COMPREHENSIVE METABOLIC PANEL
Albumin: 4.5 (ref 3.5–5.0)
Calcium: 9.1 (ref 8.7–10.7)
Globulin: 2.6

## 2020-07-13 LAB — TSH: TSH: 4.36 (ref 0.41–5.90)

## 2020-07-13 LAB — CBC: RBC: 3.98 (ref 3.87–5.11)

## 2020-07-19 DIAGNOSIS — L570 Actinic keratosis: Secondary | ICD-10-CM | POA: Diagnosis not present

## 2020-07-19 DIAGNOSIS — Z85828 Personal history of other malignant neoplasm of skin: Secondary | ICD-10-CM | POA: Diagnosis not present

## 2020-07-19 DIAGNOSIS — Z85820 Personal history of malignant melanoma of skin: Secondary | ICD-10-CM | POA: Diagnosis not present

## 2020-07-19 DIAGNOSIS — L821 Other seborrheic keratosis: Secondary | ICD-10-CM | POA: Diagnosis not present

## 2020-07-19 DIAGNOSIS — D485 Neoplasm of uncertain behavior of skin: Secondary | ICD-10-CM | POA: Diagnosis not present

## 2020-07-31 ENCOUNTER — Non-Acute Institutional Stay (SKILLED_NURSING_FACILITY): Payer: PPO | Admitting: Internal Medicine

## 2020-07-31 ENCOUNTER — Encounter: Payer: Self-pay | Admitting: Internal Medicine

## 2020-07-31 DIAGNOSIS — I5032 Chronic diastolic (congestive) heart failure: Secondary | ICD-10-CM

## 2020-07-31 DIAGNOSIS — F422 Mixed obsessional thoughts and acts: Secondary | ICD-10-CM

## 2020-07-31 DIAGNOSIS — F0280 Dementia in other diseases classified elsewhere without behavioral disturbance: Secondary | ICD-10-CM

## 2020-07-31 DIAGNOSIS — G4710 Hypersomnia, unspecified: Secondary | ICD-10-CM

## 2020-07-31 DIAGNOSIS — E782 Mixed hyperlipidemia: Secondary | ICD-10-CM

## 2020-07-31 DIAGNOSIS — K219 Gastro-esophageal reflux disease without esophagitis: Secondary | ICD-10-CM

## 2020-07-31 DIAGNOSIS — G20 Parkinson's disease: Secondary | ICD-10-CM

## 2020-07-31 DIAGNOSIS — I2510 Atherosclerotic heart disease of native coronary artery without angina pectoris: Secondary | ICD-10-CM

## 2020-07-31 DIAGNOSIS — I951 Orthostatic hypotension: Secondary | ICD-10-CM | POA: Diagnosis not present

## 2020-07-31 NOTE — Progress Notes (Signed)
Location:    [facility] Room Number: 675 Place of Service:  SNF [id number]) Provider:  Veleta Miners MD  Gayland Curry, DO  Patient Care Team: Gayland Curry, DO as PCP - General (Geriatric Medicine) [name], [name], MD as PCP - Cardiology (Cardiology) [name], [name], MD as Consulting Physician (Cardiology) [name], MD as Attending Physician (Gastroenterology) [name], MD as Consulting Physician (Neurology) Community[facility] ([place]) Royal Hawthorn, NP as Nurse Practitioner (Nurse Practitioner)  Extended Emergency Contact Information Primary Emergency Contact: [name] Address: [address] [place] [place]          [place], [place] [address] [name] of [place] Phone: [phone] Mobile Phone: [phone] Relation: Spouse  Code Status:  DNR Goals of care: Advanced Directive information Advanced Directives 07/31/2020  Does Patient Have a Medical Advance Directive? Yes  Type of Paramedic of [place];Living will;Out of facility DNR (pink MOST or yellow form)  Does patient want to make changes to medical advance directive? No - Patient declined  Copy of [place] in Chart? Yes - validated most recent copy scanned in chart (See row information)  Pre-existing out of facility DNR order (yellow form or pink MOST form) Yellow form placed in chart (order not valid for inpatient use)     Chief Complaint  Patient presents with  . Medical Management of Chronic Issues    HPI:  Pt is a 87 y.o. male seen today for medical management of chronic diseases.    Patient has h/o Parkinson disease on Sinemet Follows with Dr Jannifer Franklin H/o CAD Follows with Dr [name]  Chronic CHF with LE edema HTN, HLD Also h/o Crohn's Disease Don't see any recent follow up with GI  Orthostatic hypotension  Also has h/o OCD follows with Facility Psychiatry    He did not  have any complains today Per his wife who send me the message saying that she has noticed he is sleeping more then Usual She is also worried about his Crohn's Disease Follow up His Symptoms seem controlled His weight is stable      Past Medical History:  Diagnosis Date  . Cellulitis   . Chronic diastolic CHF (congestive heart failure) (HCC)    a. EF initially 35-40% after MI 1/09; b. echo 7/08: EF 60%;  c. 11/2014 Echo: EF 65-70%, Gr 1 DD, mild MR.  . Coronary artery disease    a. s/p anterior STEMI 04/2007 with BMS-> LAD;  b. Cath 12/2011 patent stent;  c. low risk nuc 04/2014.  . Crohn's disease ([place])   . Diaphragmatic hernia without mention of obstruction or gangrene   . Diverticulosis of colon (without mention of hemorrhage)   . Fatty liver    a. on ultrasound of 10/2009  . Flatulence, eructation, and gas pain   . Fungal infection   . Gait disorder   . GERD (gastroesophageal reflux disease)   . HOH (hard of hearing)    Hearing aids  . Hyperlipidemia   . Hypertension   . Ischemic cardiomyopathy    a. EF initially 35-40% after MI 1/09; b. echo 7/08: EF 60%;  c. 11/2014 Echo: EF 65-70%, Gr 1 DD, mild MR.  . Melanoma ([place])   . Memory disorder   . Orthostatic hypotension   . Osteoporosis   . Other chronic nonalcoholic liver disease   . Other esophagitis   . Parkinson's disease ([place])   . RBBB 09/02/2013  .  Regional enteritis of small intestine ([place])   . Renal calculi   . Restless leg syndrome 03/22/2015  . 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 Urinary incontinence    Past Surgical History:  Procedure Laterality Date  . APPENDECTOMY    . BACK SURGERY     L3, L4, L5  . CARDIAC CATHETERIZATION  09/25/06   EF 35-40% but more recently 60%  . CATARACT EXTRACTION     Bilateral  . CHOLECYSTECTOMY    . Coronary artery stent placement    . Melanoma resection    . Partial bowel resection    . TONSILLECTOMY      No Known Allergies  Allergies as of 07/31/2020   No Known  Allergies     Medication List       Accurate as of July 31, 2020  4:19 PM. If you have any questions, ask your nurse or doctor.        acetaminophen 325 MG tablet Commonly known as: TYLENOL Take 650 mg by mouth at bedtime. And q 6 hrs prn   Align 4 MG Caps Take 1 capsule by mouth daily.   aspirin EC 81 MG tablet Take 81 mg by mouth daily.   atorvastatin 40 MG tablet Commonly known as: LIPITOR Take 40 mg by mouth daily.   calcium carbonate 600 MG Tabs tablet Commonly known as: OS-CAL Take 600 mg by mouth daily with breakfast.   carbidopa-levodopa 25-100 MG tablet Commonly known as: SINEMET IR Take 1 tablet by mouth 4 (four) times daily.   carbidopa-levodopa 50-200 MG tablet Commonly known as: SINEMET CR Take 1 tablet by mouth 4 (four) times daily.   cholestyramine 4 g packet Commonly known as: QUESTRAN TAKE 1 PACKET BY MOUTH TWICE DAILY WITH A MEAL   donepezil 10 MG tablet Commonly known as: ARICEPT TAKE ONE TABLET AT BEDTIME   fluvoxaMINE 50 MG tablet Commonly known as: LUVOX Take 50 mg by mouth at bedtime.   ipratropium 0.03 % nasal spray Commonly known as: ATROVENT Place 2 sprays into both nostrils 3 (three) times daily.   lidocaine 2 % solution Commonly known as: XYLOCAINE Use as directed 15 mLs in the mouth or throat as needed for mouth pain.   melatonin 5 MG Tabs Take 1 tablet by mouth at bedtime.   midodrine 5 MG tablet Commonly known as: PROAMATINE Take 1 tablet (5 mg total) by mouth 2 (two) times daily with a meal.   multivitamin-iron-minerals-folic acid chewable tablet Chew 1 tablet by mouth daily.   nitroGLYCERIN 0.4 MG SL tablet Commonly known as: NITROSTAT Place 1 tablet (0.4 mg total) under the tongue every 5 (five) minutes as needed for chest pain. PT OVERDUE FOR OV PLEASE CALL FOR APPT   pantoprazole 40 MG tablet Commonly known as: PROTONIX Take 40 mg by mouth daily.   potassium chloride SA 20 MEQ tablet Commonly known as:  KLOR-CON Take 40 mEq by mouth daily.   sodium chloride 0.65 % Soln nasal spray Commonly known as: OCEAN Place 2 sprays into both nostrils daily as needed for congestion.   torsemide 20 MG tablet Commonly known as: DEMADEX Take 1 tablet (20 mg total) by mouth daily.   vitamin B-12 1000 MCG tablet Commonly known as: CYANOCOBALAMIN Take 1,000 mcg by mouth daily.   Vitamin D3 50 MCG (2000 UT) Tabs Take 1 tablet by mouth daily.       Review of Systems  Constitutional: Positive for activity change.  HENT: Negative.  Respiratory: Negative.   Cardiovascular: Negative.   Gastrointestinal: Negative.  Negative for abdominal distention, abdominal pain and diarrhea.  Genitourinary: Negative.   Musculoskeletal: Positive for gait problem.  Skin: Negative.   Neurological: Positive for weakness.  Psychiatric/Behavioral: Positive for confusion.    Immunization History  Administered Date(s) Administered  . Influenza, High Dose Seasonal PF 01/21/2019  . Influenza-Unspecified 12/06/2014, 11/27/2017, 02/01/2020  . Moderna Sars-Covid-2 Vaccination 04/13/2019, 05/11/2019, 02/18/2020  . Pneumococcal Conjugate-13 05/21/2018  . Pneumococcal Polysaccharide-23 05/21/2005  . Tdap 06/03/2006, 10/22/2018  . Zoster Recombinat (Shingrix) 10/22/2018, 12/24/2018   Pertinent  Health Maintenance Due  Topic Date Due  . INFLUENZA VACCINE  11/06/2020  . PNA vac Low Risk Adult  Completed   Fall Risk  11/11/2019 07/20/2019 10/22/2018 05/12/2018 10/03/2017  Falls in the past year? 1 - 1 1 No  Number falls in past yr: 0 - 1 1 -  Injury with Fall? 1 - 1 0 -  Risk for fall due to : History of fall(s);Impaired balance/gait History of fall(s);Impaired balance/gait;Impaired mobility;Mental status change;Medication side effect History of fall(s);Impaired mobility Impaired mobility;History of fall(s);Impaired balance/gait;Mental status change;Medication side effect -  Follow up Falls evaluation completed Education  provided;Falls prevention discussed;Falls evaluation completed Falls evaluation completed;Falls prevention discussed Falls evaluation completed;Falls prevention discussed;Education provided -  Comment - at [facility] - - -   Functional Status Survey:    Vitals:   07/31/20 1602  BP: (!) 157/85  Pulse: 96  Resp: 18  Temp: (!) 97 F (36.1 C)  SpO2: 93%  Weight: 175 lb 9.6 oz (79.7 kg)  Height: 5' 9.8" (1.773 m)   Body mass index is 25.34 kg/m. Physical Exam Vitals reviewed.  Constitutional:      Appearance: Normal appearance.  HENT:     Head: Normocephalic.     Nose: Nose normal.     Mouth/Throat:     Mouth: Mucous membranes are moist.     Pharynx: Oropharynx is clear.  Eyes:     Pupils: Pupils are equal, round, and reactive to light.  Cardiovascular:     Rate and Rhythm: Normal rate and regular rhythm.     Pulses: Normal pulses.     Heart sounds: Normal heart sounds.  Pulmonary:     Effort: Pulmonary effort is normal.     Breath sounds: Normal breath sounds.  Abdominal:     General: Abdomen is flat. Bowel sounds are normal.     Palpations: Abdomen is soft.  Musculoskeletal:        General: Swelling present.     Cervical back: Neck supple.  Skin:    General: Skin is warm.  Neurological:     General: No focal deficit present.     Mental Status: He is alert.  Psychiatric:        Mood and Affect: Mood normal.        Thought Content: Thought content normal.     Labs reviewed: Recent Labs    07/13/20 0000  NA 139  K 3.6  CL 100  CO2 24*  BUN 20  CREATININE 1.1  CALCIUM 9.1   Recent Labs    07/13/20 0000  AST 33  ALT 8*  ALKPHOS 83  ALBUMIN 4.5   Recent Labs    08/19/19 0000 10/12/19 0000 07/13/20 0000  WBC 3.1 8.1 7.4  NEUTROABS  --  5  --   HGB 13.5 13.6 12.5*  HCT 41 40* 37*  PLT 138* 241 232   Lab Results  Component Value Date   TSH 4.36 07/13/2020   No results found for: HGBA1C Lab Results  Component Value Date   CHOL 125  07/13/2020   HDL 60 07/13/2020   LDLCALC 45 07/13/2020   TRIG 100 07/13/2020    Significant Diagnostic Results in last 30 days:  No results found.  Assessment/Plan Excessive sleepiness Both Wife and Patient have noticed that ? Due to Luvox  Will ask the Psychiatry to reval Labs done recently were all in good limits Also Change Melatonin to PRN  Parkinson disease ([place]) Follows with Neurology On SInemet Dementia due to Parkinson's disease without behavioral disturbance (HCC) On Aricept Crohns Disease On Questran  No Signs of Active Symptoms Chronic diastolic CHF (congestive heart failure) (HCC) Continue Demadex  BUN and Creat stable Mixed obsessional thoughts and acts Seems to be doing well on Luvox  Gastroesophageal reflux disease without esophagitis On Protonix Orthostatic hypotension Stable on Midodrine Coronary artery disease involving native coronary artery of native heart without angina pectoris On Aspirin and Statin Mixed hyperlipidemia On Statin    Family/ staff Communication:   Labs/tests ordered:

## 2020-08-24 DIAGNOSIS — F605 Obsessive-compulsive personality disorder: Secondary | ICD-10-CM | POA: Diagnosis not present

## 2020-08-25 ENCOUNTER — Non-Acute Institutional Stay (SKILLED_NURSING_FACILITY): Payer: PPO | Admitting: Adult Health

## 2020-08-25 ENCOUNTER — Encounter: Payer: Self-pay | Admitting: Adult Health

## 2020-08-25 DIAGNOSIS — K5000 Crohn's disease of small intestine without complications: Secondary | ICD-10-CM

## 2020-08-25 DIAGNOSIS — I5032 Chronic diastolic (congestive) heart failure: Secondary | ICD-10-CM | POA: Diagnosis not present

## 2020-08-25 DIAGNOSIS — F0280 Dementia in other diseases classified elsewhere without behavioral disturbance: Secondary | ICD-10-CM | POA: Diagnosis not present

## 2020-08-25 DIAGNOSIS — G20 Parkinson's disease: Secondary | ICD-10-CM | POA: Diagnosis not present

## 2020-08-25 DIAGNOSIS — F422 Mixed obsessional thoughts and acts: Secondary | ICD-10-CM

## 2020-08-25 DIAGNOSIS — K219 Gastro-esophageal reflux disease without esophagitis: Secondary | ICD-10-CM | POA: Diagnosis not present

## 2020-08-25 NOTE — Progress Notes (Signed)
Location:   [facility] Room Number: 211-H Place of Service:  SNF [phone]) Provider:  Royal Hawthorn, NP    Patient Care Team: Gayland Curry, DO as PCP - General (Geriatric Medicine) [name], [name], MD as PCP - Cardiology (Cardiology) [name], [name], MD as Consulting Physician (Cardiology) [name], MD as Attending Physician (Gastroenterology) [name], MD as Consulting Physician (Neurology) Community, [facility] ([place]) Royal Hawthorn, NP as Nurse Practitioner (Nurse Practitioner)  Extended Emergency Contact Information Primary Emergency Contact: [name] Address: [address] [place] [place]          [place], [place] [address] [name] of [place] Phone: [phone] Mobile Phone: [phone] Relation: Spouse  Code Status:  DNR Goals of care: Advanced Directive information Advanced Directives 08/25/2020  Does Patient Have a Medical Advance Directive? Yes  Type of Paramedic of [place];Living will;Out of facility DNR (pink MOST or yellow form)  Does patient want to make changes to medical advance directive? No - Patient declined  Copy of [place] in Chart? Yes - validated most recent copy scanned in chart (See row information)  Pre-existing out of facility DNR order (yellow form or pink MOST form) -     Chief Complaint  Patient presents with  . Medical Management of Chronic Issues    Routine Visit.  . Immunizations    Discuss the need for 2nd Covid Booster.    HPI:  Pt is a 87 y.o. male seen today for medical management of chronic diseases.   The nurse reports that he continues to have issues with OCD behavior such and bathing and grooming rituals. This causes anxiety and interferes with his ADLs. He was seen by Dr. Casimiro Needle and he is tapering off luvox and trying Paxil (which will start 5/26).  There was concern that the Luvox was  causing sleepiness. At this time he seems alert.   BP ranges 120-140 range  Weight is stable, appetite good  Pleasant disposition with a hx of memory loss  PD: continues on sinemet, no issue with tremor or rigidity. Has falls due to transfers without help.     Past Medical History:  Diagnosis Date  . Cellulitis   . Chronic diastolic CHF (congestive heart failure) (HCC)    a. EF initially 35-40% after MI 1/09; b. echo 7/08: EF 60%;  c. 11/2014 Echo: EF 65-70%, Gr 1 DD, mild MR.  . Coronary artery disease    a. s/p anterior STEMI 04/2007 with BMS-> LAD;  b. Cath 12/2011 patent stent;  c. low risk nuc 04/2014.  . Crohn's disease ([place])   . Diaphragmatic hernia without mention of obstruction or gangrene   . Diverticulosis of colon (without mention of hemorrhage)   . Fatty liver    a. on ultrasound of 10/2009  . Flatulence, eructation, and gas pain   . Fungal infection   . Gait disorder   . GERD (gastroesophageal reflux disease)   . HOH (hard of hearing)    Hearing aids  . Hyperlipidemia   . Hypertension   . Ischemic cardiomyopathy    a. EF initially 35-40% after MI 1/09; b. echo 7/08: EF 60%;  c. 11/2014 Echo: EF 65-70%, Gr 1 DD, mild MR.  . Melanoma ([place])   . Memory disorder   . Orthostatic hypotension   . Osteoporosis   . Other chronic nonalcoholic liver disease   . Other esophagitis   . Parkinson's  disease ([place])   . RBBB 09/02/2013  . Regional enteritis of small intestine ([place])   . Renal calculi   . Restless leg syndrome 03/22/2015  . Ulcerative (chronic) ileocolitis ([place])   . Urinary incontinence    Past Surgical History:  Procedure Laterality Date  . APPENDECTOMY    . BACK SURGERY     L3, L4, L5  . CARDIAC CATHETERIZATION  09/25/06   EF 35-40% but more recently 60%  . CATARACT EXTRACTION     Bilateral  . CHOLECYSTECTOMY    . Coronary artery stent placement    . Melanoma resection    . Partial bowel resection    . TONSILLECTOMY      No Known  Allergies  Allergies as of 08/25/2020   No Known Allergies     Medication List       Accurate as of Aug 25, 2020  9:55 AM. If you have any questions, ask your nurse or doctor.        STOP taking these medications   melatonin 5 MG Tabs Stopped by: Royal Hawthorn, NP     TAKE these medications   acetaminophen 325 MG tablet Commonly known as: TYLENOL Take 650 mg by mouth at bedtime. And q 6 hrs prn   Align 4 MG Caps Take 1 capsule by mouth daily.   aspirin EC 81 MG tablet Take 81 mg by mouth daily.   atorvastatin 40 MG tablet Commonly known as: LIPITOR Take 40 mg by mouth daily.   calcium carbonate 600 MG Tabs tablet Commonly known as: OS-CAL Take 600 mg by mouth daily with breakfast.   carbidopa-levodopa 25-100 MG tablet Commonly known as: SINEMET IR Take 1 tablet by mouth 4 (four) times daily.   carbidopa-levodopa 50-200 MG tablet Commonly known as: SINEMET CR Take 1 tablet by mouth 4 (four) times daily.   cholestyramine 4 g packet Commonly known as: QUESTRAN TAKE 1 PACKET BY MOUTH TWICE DAILY WITH A MEAL   donepezil 10 MG tablet Commonly known as: ARICEPT TAKE ONE TABLET AT BEDTIME   fluvoxaMINE 50 MG tablet Commonly known as: LUVOX Take 50 mg by mouth at bedtime.   ipratropium 0.03 % nasal spray Commonly known as: ATROVENT Place 2 sprays into both nostrils 3 (three) times daily.   lidocaine 2 % solution Commonly known as: XYLOCAINE Use as directed 15 mLs in the mouth or throat as needed for mouth pain.   midodrine 5 MG tablet Commonly known as: PROAMATINE Take 1 tablet (5 mg total) by mouth 2 (two) times daily with a meal.   multivitamin-iron-minerals-folic acid chewable tablet Chew 1 tablet by mouth daily.   nitroGLYCERIN 0.4 MG SL tablet Commonly known as: NITROSTAT Place 1 tablet (0.4 mg total) under the tongue every 5 (five) minutes as needed for chest pain. PT OVERDUE FOR OV PLEASE CALL FOR APPT   pantoprazole 40 MG tablet Commonly  known as: PROTONIX Take 40 mg by mouth daily.   potassium chloride SA 20 MEQ tablet Commonly known as: KLOR-CON Take 40 mEq by mouth daily.   sodium chloride 0.65 % Soln nasal spray Commonly known as: OCEAN Place 2 sprays into both nostrils daily as needed for congestion.   torsemide 20 MG tablet Commonly known as: DEMADEX Take 1 tablet (20 mg total) by mouth daily.   vitamin B-12 1000 MCG tablet Commonly known as: CYANOCOBALAMIN Take 1,000 mcg by mouth daily.   Vitamin D3 50 MCG (2000 UT) Tabs Take 1 tablet by mouth daily.  Review of Systems  Constitutional: Negative for activity change, appetite change, chills, diaphoresis, fatigue, fever and unexpected weight change.  Respiratory: Negative for cough, shortness of breath, wheezing and stridor.   Cardiovascular: Negative for chest pain, palpitations and leg swelling.  Gastrointestinal: Negative for abdominal distention, abdominal pain, constipation and diarrhea.  Genitourinary: Negative for difficulty urinating and dysuria.  Musculoskeletal: Positive for gait problem. Negative for arthralgias, back pain, joint swelling and myalgias.  Neurological: Negative for dizziness, seizures, syncope, facial asymmetry, speech difficulty, weakness and headaches.  Hematological: Negative for adenopathy. Does not bruise/bleed easily.  Psychiatric/Behavioral: Positive for agitation, behavioral problems and confusion.    Immunization History  Administered Date(s) Administered  . Influenza, High Dose Seasonal PF 01/21/2019  . Influenza-Unspecified 12/06/2014, 11/27/2017, 02/01/2020  . Moderna Sars-Covid-2 Vaccination 04/13/2019, 05/11/2019, 02/18/2020  . Pneumococcal Conjugate-13 05/21/2018  . Pneumococcal Polysaccharide-23 05/21/2005  . Tdap 06/03/2006, 10/22/2018  . Zoster Recombinat (Shingrix) 10/22/2018, 12/24/2018   Pertinent  Health Maintenance Due  Topic Date Due  . URINE MICROALBUMIN  Never done  . INFLUENZA VACCINE   11/06/2020  . PNA vac Low Risk Adult  Completed   Fall Risk  11/11/2019 07/20/2019 10/22/2018 05/12/2018 10/03/2017  Falls in the past year? 1 - 1 1 No  Number falls in past yr: 0 - 1 1 -  Injury with Fall? 1 - 1 0 -  Risk for fall due to : History of fall(s);Impaired balance/gait History of fall(s);Impaired balance/gait;Impaired mobility;Mental status change;Medication side effect History of fall(s);Impaired mobility Impaired mobility;History of fall(s);Impaired balance/gait;Mental status change;Medication side effect -  Follow up Falls evaluation completed Education provided;Falls prevention discussed;Falls evaluation completed Falls evaluation completed;Falls prevention discussed Falls evaluation completed;Falls prevention discussed;Education provided -  Comment - at [place] - - -   Functional Status Survey:    Vitals:   08/25/20 0944  BP: (!) 142/74  Pulse: 99  Resp: 16  Temp: (!) 97.2 F (36.2 C)  SpO2: 96%  Weight: 176 lb 12.8 oz (80.2 kg)  Height: 5' 9.8" (1.773 m)   Body mass index is 25.51 kg/m. Physical Exam Vitals and nursing note reviewed.  Constitutional:      General: He is not in acute distress.    Appearance: He is not diaphoretic.  HENT:     Head: Normocephalic and atraumatic.  Neck:     Thyroid: No thyromegaly.     Vascular: No JVD.     Trachea: No tracheal deviation.  Cardiovascular:     Rate and Rhythm: Normal rate and regular rhythm.     Heart sounds: No murmur heard.   Pulmonary:     Effort: Pulmonary effort is normal. No respiratory distress.     Breath sounds: Normal breath sounds. No wheezing.  Abdominal:     General: Bowel sounds are normal. There is no distension.     Palpations: Abdomen is soft.     Tenderness: There is no abdominal tenderness.  Lymphadenopathy:     Cervical: No cervical adenopathy.  Skin:    General: Skin is warm and dry.  Neurological:     Mental Status: He is alert and oriented to person, place, and time.     Cranial  Nerves: No cranial nerve deficit.     Labs reviewed: Recent Labs    07/13/20 0000  NA 139  K 3.6  CL 100  CO2 24*  BUN 20  CREATININE 1.1  CALCIUM 9.1   Recent Labs    07/13/20 0000  AST 33  ALT 8*  ALKPHOS 83  ALBUMIN 4.5   Recent Labs    10/12/19 0000 07/13/20 0000  WBC 8.1 7.4  NEUTROABS 5  --   HGB 13.6 12.5*  HCT 40* 37*  PLT 241 232   Lab Results  Component Value Date   TSH 4.36 07/13/2020   No results found for: HGBA1C Lab Results  Component Value Date   CHOL 125 07/13/2020   HDL 60 07/13/2020   LDLCALC 45 07/13/2020   TRIG 100 07/13/2020    Significant Diagnostic Results in last 30 days:  No results found.    Assessment/Plan  1. Mixed obsessional thoughts and acts He is going through a taper of luvox and then will be started Paxil.  We appreciate the care provided by Dr. Casimiro Needle  2. Dementia due to Parkinson's disease without behavioral disturbance (HCC) Mild memory loss Continue Aricept 10 mg qd  3. Parkinson disease ([place]) Continue Sinemet as prescribed by neurology   4. Crohn's disease of small intestine without complication ([place]) No current issues Continue questran   5. Gastroesophageal reflux disease without esophagitis With prior hx of esophagitis No current symptoms Continue PPI  6. Chronic diastolic CHF (congestive heart failure) (HCC) Compensated Continue torsemide Followed by cardiology    Labs/tests ordered:  NA

## 2020-08-28 ENCOUNTER — Encounter: Payer: Self-pay | Admitting: Adult Health

## 2020-08-28 DIAGNOSIS — H52203 Unspecified astigmatism, bilateral: Secondary | ICD-10-CM | POA: Diagnosis not present

## 2020-08-28 DIAGNOSIS — Z961 Presence of intraocular lens: Secondary | ICD-10-CM | POA: Diagnosis not present

## 2020-10-02 ENCOUNTER — Encounter: Payer: Self-pay | Admitting: Adult Health

## 2020-10-02 ENCOUNTER — Non-Acute Institutional Stay (SKILLED_NURSING_FACILITY): Payer: PPO | Admitting: Adult Health

## 2020-10-02 DIAGNOSIS — I2510 Atherosclerotic heart disease of native coronary artery without angina pectoris: Secondary | ICD-10-CM | POA: Diagnosis not present

## 2020-10-02 DIAGNOSIS — E782 Mixed hyperlipidemia: Secondary | ICD-10-CM

## 2020-10-02 DIAGNOSIS — F422 Mixed obsessional thoughts and acts: Secondary | ICD-10-CM

## 2020-10-02 DIAGNOSIS — I951 Orthostatic hypotension: Secondary | ICD-10-CM | POA: Diagnosis not present

## 2020-10-02 DIAGNOSIS — G20 Parkinson's disease: Secondary | ICD-10-CM

## 2020-10-02 DIAGNOSIS — K5000 Crohn's disease of small intestine without complications: Secondary | ICD-10-CM | POA: Diagnosis not present

## 2020-10-02 DIAGNOSIS — F0280 Dementia in other diseases classified elsewhere without behavioral disturbance: Secondary | ICD-10-CM | POA: Diagnosis not present

## 2020-10-02 NOTE — Progress Notes (Signed)
Location:  [facility] Room Number: 709-G Place of Service:  SNF [phone]) Provider: Royal Hawthorn, NP   Patient Care Team: [name], MD as PCP - General (Internal Medicine) [name], [name], MD as PCP - Cardiology (Cardiology) [name], [name], MD as Consulting Physician (Cardiology) [name], MD as Attending Physician (Gastroenterology) [name], MD as Consulting Physician (Neurology) Community, [facility] ([place]) Royal Hawthorn, NP as Nurse Practitioner (Nurse Practitioner)  Extended Emergency Contact Information Primary Emergency Contact: [name] Address: [address] [address] [place]          [place], [place] [address] [name] of [place] Phone: [phone] Mobile Phone: [phone] Relation: Spouse  Code Status:  DNR Goals of care: Advanced Directive information Advanced Directives 10/02/2020  Does Patient Have a Medical Advance Directive? No  Type of Paramedic of [place];Living will;Out of facility DNR (pink MOST or yellow form)  Does patient want to make changes to medical advance directive? No - Patient declined  Copy of [place] in Chart? Yes - validated most recent copy scanned in chart (See row information)  Pre-existing out of facility DNR order (yellow form or pink MOST form) Yellow form placed in chart (order not valid for inpatient use)     Chief Complaint  Patient presents with   Medical Management of Chronic Issues    HPI:  Pt is a 87 y.o. male seen today for medical management of chronic diseases.    CAD with hx of prior stent: no new issues on statin, see cardiology   HLD on statin: LDL 45 07/13/20  BP 700-174 systlic, no issues with chest pain, headache, or increased edema  Continues on questran for hx of Crohn's and diarrhea. No issues with loose stools or abd pain at this time. Eating well. Loves to go out to  eat. Weight is above goal but unchanged.   Wt Readings from Last 3 Encounters:  10/02/20 175 lb 6.4 oz (79.6 kg)  08/25/20 176 lb 12.8 oz (80.2 kg)  07/31/20 175 lb 9.6 oz (79.7 kg)   Memory loss: on aricept without s/e, saw neurology in march. Has some OCD behaviors and is followed by Dr Casimiro Needle. They have been difficult to control.  Very focused on bathing and cleaning leading to skin irritation and anxiety about routine.  Past Medical History:  Diagnosis Date   Cellulitis    Chronic diastolic CHF (congestive heart failure) ([place])    a. EF initially 35-40% after MI 1/09; b. echo 7/08: EF 60%;  c. 11/2014 Echo: EF 65-70%, Gr 1 DD, mild MR.   Coronary artery disease    a. s/p anterior STEMI 04/2007 with BMS-> LAD;  b. Cath 12/2011 patent stent;  c. low risk nuc 04/2014.   Crohn's disease ([place])    Diaphragmatic hernia without mention of obstruction or gangrene    Diverticulosis of colon (without mention of hemorrhage)    Fatty liver    a. on ultrasound of 10/2009   Flatulence, eructation, and gas pain    Fungal infection    Gait disorder    GERD (gastroesophageal reflux disease)    HOH (hard of hearing)    Hearing aids   Hyperlipidemia    Hypertension    Ischemic cardiomyopathy    a. EF initially 35-40% after MI 1/09; b. echo 7/08: EF 60%;  c. 11/2014 Echo: EF 65-70%, Gr 1 DD, mild MR.   Melanoma ([place])  Memory disorder    Orthostatic hypotension    Osteoporosis    Other chronic nonalcoholic liver disease    Other esophagitis    Parkinson's disease ([place])    RBBB 09/02/2013   Regional enteritis of small intestine (HCC)    Renal calculi    Restless leg syndrome 03/22/2015   Ulcerative (chronic) ileocolitis (HCC)    Urinary incontinence    Past Surgical History:  Procedure Laterality Date   APPENDECTOMY     BACK SURGERY     L3, L4, L5   CARDIAC CATHETERIZATION  09/25/06   EF 35-40% but more recently 60%   CATARACT EXTRACTION     Bilateral   CHOLECYSTECTOMY     Coronary  artery stent placement     Melanoma resection     Partial bowel resection     TONSILLECTOMY      No Known Allergies  Outpatient Encounter Medications as of 10/02/2020  Medication Sig   acetaminophen (TYLENOL) 325 MG tablet Take 650 mg by mouth at bedtime. And q 6 hrs prn   aspirin EC 81 MG tablet Take 81 mg by mouth daily.   atorvastatin (LIPITOR) 40 MG tablet Take 40 mg by mouth daily.   calcium carbonate (OS-CAL) 600 MG TABS tablet Take 600 mg by mouth daily with breakfast.   carbidopa-levodopa (SINEMET CR) 50-200 MG tablet Take 1 tablet by mouth 4 (four) times daily.   carbidopa-levodopa (SINEMET IR) 25-100 MG tablet Take 1 tablet by mouth 4 (four) times daily.   Cholecalciferol (VITAMIN D3) 50 MCG (2000 UT) TABS Take 1 tablet by mouth daily.   cholestyramine (QUESTRAN) 4 g packet TAKE 1 PACKET BY MOUTH TWICE DAILY WITH A MEAL   donepezil (ARICEPT) 10 MG tablet TAKE ONE TABLET AT BEDTIME   ipratropium (ATROVENT) 0.03 % nasal spray Place 2 sprays into both nostrils 3 (three) times daily.   lidocaine (XYLOCAINE) 2 % solution Use as directed 15 mLs in the mouth or throat as needed for mouth pain.   midodrine (PROAMATINE) 5 MG tablet Take 1 tablet (5 mg total) by mouth 2 (two) times daily with a meal.   multivitamin-iron-minerals-folic acid (CENTRUM) chewable tablet Chew 1 tablet by mouth daily.   nitroGLYCERIN (NITROSTAT) 0.4 MG SL tablet Place 1 tablet (0.4 mg total) under the tongue every 5 (five) minutes as needed for chest pain. PT OVERDUE FOR OV PLEASE CALL FOR APPT   pantoprazole (PROTONIX) 40 MG tablet Take 40 mg by mouth daily.   PARoxetine (PAXIL) 10 MG tablet Take 10 mg by mouth 2 (two) times daily.   potassium chloride SA (K-DUR,KLOR-CON) 20 MEQ tablet Take 40 mEq by mouth daily.   Probiotic Product (ALIGN) 4 MG CAPS Take 1 capsule by mouth daily.    sodium chloride (OCEAN) 0.65 % SOLN nasal spray Place 2 sprays into both nostrils daily as needed for congestion.    torsemide  (DEMADEX) 20 MG tablet Take 1 tablet (20 mg total) by mouth daily.   vitamin B-12 (CYANOCOBALAMIN) 1000 MCG tablet Take 1,000 mcg by mouth daily.   [DISCONTINUED] fluvoxaMINE (LUVOX) 50 MG tablet Take 50 mg by mouth at bedtime.   No facility-administered encounter medications on file as of 10/02/2020.    Review of Systems  Constitutional:  Negative for activity change, appetite change, chills, diaphoresis and fever.  HENT:  Negative for congestion.   Respiratory:  Negative for cough and wheezing.   Cardiovascular:  Negative for chest pain and leg swelling.  Gastrointestinal:  Negative  for abdominal pain, constipation, diarrhea, nausea and vomiting.  Genitourinary:  Negative for difficulty urinating, dysuria and urgency.  Musculoskeletal:  Positive for gait problem. Negative for back pain, myalgias and neck pain.  Skin:  Negative for color change, pallor and wound.  Neurological:  Negative for dizziness, seizures, facial asymmetry, light-headedness and headaches.  Psychiatric/Behavioral:  Positive for agitation and behavioral problems. Negative for self-injury and sleep disturbance. The patient is nervous/anxious (about routine).        Memory loss   Immunization History  Administered Date(s) Administered   Influenza, High Dose Seasonal PF 01/21/2019   Influenza-Unspecified 12/06/2014, 11/27/2017, 02/01/2020   Moderna Sars-Covid-2 Vaccination 04/13/2019, 05/11/2019, 02/18/2020   Pneumococcal Conjugate-13 05/21/2018   Pneumococcal Polysaccharide-23 05/21/2005   Tdap 06/03/2006, 10/22/2018   Zoster Recombinat (Shingrix) 10/22/2018, 12/24/2018   Pertinent  Health Maintenance Due  Topic Date Due   URINE MICROALBUMIN  Never done   INFLUENZA VACCINE  11/06/2020   PNA vac Low Risk Adult  Completed   Fall Risk  11/11/2019 07/20/2019 10/22/2018 05/12/2018 10/03/2017  Falls in the past year? 1 - 1 1 No  Number falls in past yr: 0 - 1 1 -  Injury with Fall? 1 - 1 0 -  Risk for fall due to :  History of fall(s);Impaired balance/gait History of fall(s);Impaired balance/gait;Impaired mobility;Mental status change;Medication side effect History of fall(s);Impaired mobility Impaired mobility;History of fall(s);Impaired balance/gait;Mental status change;Medication side effect -  Follow up Falls evaluation completed Education provided;Falls prevention discussed;Falls evaluation completed Falls evaluation completed;Falls prevention discussed Falls evaluation completed;Falls prevention discussed;Education provided -  Comment - at [place] - - -   Functional Status Survey:    Vitals:   10/02/20 1033  BP: 121/74  Pulse: 78  Resp: 16  Temp: (!) 97.2 F (36.2 C)  SpO2: 90%  Weight: 175 lb 6.4 oz (79.6 kg)  Height: 5' 9"  (1.753 m)   Body mass index is 25.9 kg/m. Physical Exam Vitals and nursing note reviewed.  Constitutional:      General: He is not in acute distress.    Appearance: He is not diaphoretic.  HENT:     Head: Normocephalic and atraumatic.  Neck:     Thyroid: No thyromegaly.     Vascular: No JVD.     Trachea: No tracheal deviation.  Cardiovascular:     Rate and Rhythm: Normal rate and regular rhythm.     Heart sounds: No murmur heard. Pulmonary:     Effort: Pulmonary effort is normal. No respiratory distress.     Breath sounds: Normal breath sounds. No wheezing.  Abdominal:     General: Bowel sounds are normal. There is no distension.     Palpations: Abdomen is soft.     Tenderness: There is no abdominal tenderness.  Musculoskeletal:     Right lower leg: No edema.     Left lower leg: No edema.  Lymphadenopathy:     Cervical: No cervical adenopathy.  Skin:    General: Skin is warm and dry.  Neurological:     Mental Status: He is alert and oriented to person, place, and time.     Cranial Nerves: No cranial nerve deficit.    Labs reviewed: Recent Labs    07/13/20 0000  NA 139  K 3.6  CL 100  CO2 24*  BUN 20  CREATININE 1.1  CALCIUM 9.1    Recent Labs    07/13/20 0000  AST 33  ALT 8*  ALKPHOS 83  ALBUMIN 4.5  Recent Labs    10/12/19 0000 07/13/20 0000  WBC 8.1 7.4  NEUTROABS 5  --   HGB 13.6 12.5*  HCT 40* 37*  PLT 241 232   Lab Results  Component Value Date   TSH 4.36 07/13/2020   No results found for: HGBA1C Lab Results  Component Value Date   CHOL 125 07/13/2020   HDL 60 07/13/2020   LDLCALC 45 07/13/2020   TRIG 100 07/13/2020    Significant Diagnostic Results in last 30 days:  No results found.  Assessment/Plan  1. Dementia due to Parkinson's disease without behavioral disturbance ([place]) Continues to benefit from the skilled care environment Continues on Aricept without s/e  2. Crohn's disease of small intestine without complication ([place]) Doing well on Questran and Align  3. Coronary artery disease involving native coronary artery of native heart without angina pectoris Continue statin and baby asa   4. Orthostatic hypotension No issues Continue midodrine   5. Mixed obsessional thoughts and acts This continues to be the main concern regarding his care. He is on paxil now and followed by Dr. Casimiro Needle. Not showing benefit yet but will give it more time to work.   6. Mixed hyperlipidemia Continue lipitor 40 mg qd

## 2020-10-03 ENCOUNTER — Encounter: Payer: Self-pay | Admitting: Adult Health

## 2020-10-16 DIAGNOSIS — E785 Hyperlipidemia, unspecified: Secondary | ICD-10-CM | POA: Diagnosis not present

## 2020-10-16 LAB — TSH: TSH: 3.6 (ref 0.41–5.90)

## 2020-10-23 ENCOUNTER — Non-Acute Institutional Stay (SKILLED_NURSING_FACILITY): Payer: PPO | Admitting: Internal Medicine

## 2020-10-23 ENCOUNTER — Encounter: Payer: Self-pay | Admitting: Internal Medicine

## 2020-10-23 DIAGNOSIS — I951 Orthostatic hypotension: Secondary | ICD-10-CM

## 2020-10-23 DIAGNOSIS — F0280 Dementia in other diseases classified elsewhere without behavioral disturbance: Secondary | ICD-10-CM | POA: Diagnosis not present

## 2020-10-23 DIAGNOSIS — F422 Mixed obsessional thoughts and acts: Secondary | ICD-10-CM | POA: Diagnosis not present

## 2020-10-23 DIAGNOSIS — G20 Parkinson's disease: Secondary | ICD-10-CM | POA: Diagnosis not present

## 2020-10-23 DIAGNOSIS — I2510 Atherosclerotic heart disease of native coronary artery without angina pectoris: Secondary | ICD-10-CM

## 2020-10-23 DIAGNOSIS — K5000 Crohn's disease of small intestine without complications: Secondary | ICD-10-CM

## 2020-10-23 DIAGNOSIS — E782 Mixed hyperlipidemia: Secondary | ICD-10-CM | POA: Diagnosis not present

## 2020-10-23 DIAGNOSIS — I5032 Chronic diastolic (congestive) heart failure: Secondary | ICD-10-CM | POA: Diagnosis not present

## 2020-10-23 NOTE — Progress Notes (Signed)
Location:   [facility] Room Number: 712 Place of Service:  SNF [id number]) Provider:  Veleta Miners MD  [name], MD  Patient Care Team: [name], MD as PCP - General (Internal Medicine) [name], [name], MD as PCP - Cardiology (Cardiology) [name], [name], MD as Consulting Physician (Cardiology) [name], MD as Attending Physician (Gastroenterology) [name], MD as Consulting Physician (Neurology) Community, [facility] ([place]) Royal Hawthorn, NP as Nurse Practitioner (Nurse Practitioner)  Extended Emergency Contact Information Primary Emergency Contact: [name] Address: [address] [place] [address] [place]          [place], [place] [address] [name] of [place] Phone: [phone] Mobile Phone: [phone] Relation: Spouse  Code Status:  DNR Goals of care: Advanced Directive information Advanced Directives 10/23/2020  Does Patient Have a Medical Advance Directive? No  Type of Paramedic of [place];Living will;Out of facility DNR (pink MOST or yellow form)  Does patient want to make changes to medical advance directive? No - Patient declined  Copy of [place] in Chart? Yes - validated most recent copy scanned in chart (See row information)  Pre-existing out of facility DNR order (yellow form or pink MOST form) Yellow form placed in chart (order not valid for inpatient use)     Chief Complaint  Patient presents with   Medical Management of Chronic Issues   Health Maintenance    #4 covid booster and urine microalbumin    HPI:  Pt is a 87 y.o. male seen today for medical management of chronic diseases.    Patient has h/o Parkinson disease on Sinemet Follows with Dr Jannifer Franklin H/o CAD Follows with Dr [name] Chronic CHF with LE edema HTN, HLD Also h/o Crohn's Disease Don't see any recent follow up with GI Orthostatic hypotension Also  has h/o OCD follows with Facility Psychiatry    Weight is stable No New Issues.  Doing better since taken off Luvox for Sleepiness Now on Paxil Per nurses his Symptoms are controlled he has OCD issues with needing to clean all the time   Past Medical History:  Diagnosis Date   Cellulitis    Chronic diastolic CHF (congestive heart failure) ([place])    a. EF initially 35-40% after MI 1/09; b. echo 7/08: EF 60%;  c. 11/2014 Echo: EF 65-70%, Gr 1 DD, mild MR.   Coronary artery disease    a. s/p anterior STEMI 04/2007 with BMS-> LAD;  b. Cath 12/2011 patent stent;  c. low risk nuc 04/2014.   Crohn's disease ([place])    Diaphragmatic hernia without mention of obstruction or gangrene    Diverticulosis of colon (without mention of hemorrhage)    Fatty liver    a. on ultrasound of 10/2009   Flatulence, eructation, and gas pain    Fungal infection    Gait disorder    GERD (gastroesophageal reflux disease)    HOH (hard of hearing)    Hearing aids   Hyperlipidemia    Hypertension    Ischemic cardiomyopathy    a. EF initially 35-40% after MI 1/09; b. echo 7/08: EF 60%;  c. 11/2014 Echo: EF 65-70%, Gr 1 DD, mild MR.   Melanoma ([place])    Memory disorder    Orthostatic hypotension    Osteoporosis    Other chronic nonalcoholic liver disease    Other esophagitis    Parkinson's disease ([place])    RBBB 09/02/2013   Regional  enteritis of small intestine ([place])    Renal calculi    Restless leg syndrome 03/22/2015   Ulcerative (chronic) ileocolitis (HCC)    Urinary incontinence    Past Surgical History:  Procedure Laterality Date   APPENDECTOMY     BACK SURGERY     L3, L4, L5   CARDIAC CATHETERIZATION  09/25/06   EF 35-40% but more recently 60%   CATARACT EXTRACTION     Bilateral   CHOLECYSTECTOMY     Coronary artery stent placement     Melanoma resection     Partial bowel resection     TONSILLECTOMY      No Known Allergies  Allergies as of 10/23/2020   No Known Allergies      Medication List         Accurate as of October 23, 2020  1:39 PM. If you have any questions, ask your nurse or doctor.          acetaminophen 325 MG tablet Commonly known as: TYLENOL Take 650 mg by mouth at bedtime. And q 6 hrs prn   Align 4 MG Caps Take 1 capsule by mouth daily.   aspirin EC 81 MG tablet Take 81 mg by mouth daily.   atorvastatin 40 MG tablet Commonly known as: LIPITOR Take 40 mg by mouth daily.   calcium carbonate 600 MG Tabs tablet Commonly known as: OS-CAL Take 600 mg by mouth daily with breakfast.   carbidopa-levodopa 25-100 MG tablet Commonly known as: SINEMET IR Take 1 tablet by mouth 4 (four) times daily.   carbidopa-levodopa 50-200 MG tablet Commonly known as: SINEMET CR Take 1 tablet by mouth 4 (four) times daily.   cholestyramine 4 g packet Commonly known as: QUESTRAN TAKE 1 PACKET BY MOUTH TWICE DAILY WITH A MEAL   donepezil 10 MG tablet Commonly known as: ARICEPT TAKE ONE TABLET AT BEDTIME   ipratropium 0.03 % nasal spray Commonly known as: ATROVENT Place 2 sprays into both nostrils 3 (three) times daily.   lidocaine 2 % solution Commonly known as: XYLOCAINE Use as directed 15 mLs in the mouth or throat as needed for mouth pain.   midodrine 5 MG tablet Commonly known as: PROAMATINE Take 1 tablet (5 mg total) by mouth 2 (two) times daily with a meal.   multivitamin-iron-minerals-folic acid chewable tablet Chew 1 tablet by mouth daily.   nitroGLYCERIN 0.4 MG SL tablet Commonly known as: NITROSTAT Place 1 tablet (0.4 mg total) under the tongue every 5 (five) minutes as needed for chest pain. PT OVERDUE FOR OV PLEASE CALL FOR APPT   pantoprazole 40 MG tablet Commonly known as: PROTONIX Take 40 mg by mouth daily.   PARoxetine 10 MG tablet Commonly known as: PAXIL Take 10 mg by mouth 2 (two) times daily.   potassium chloride SA 20 MEQ tablet Commonly known as: KLOR-CON Take 40 mEq by mouth daily.   sodium chloride 0.65 % Soln nasal  spray Commonly known as: OCEAN Place 2 sprays into both nostrils daily as needed for congestion.   torsemide 20 MG tablet Commonly known as: DEMADEX Take 1 tablet (20 mg total) by mouth daily.   vitamin B-12 1000 MCG tablet Commonly known as: CYANOCOBALAMIN Take 1,000 mcg by mouth daily.   Vitamin D3 50 MCG (2000 UT) Tabs Take 1 tablet by mouth daily.        Review of Systems  Constitutional: Negative.   HENT: Negative.    Respiratory: Negative.    Cardiovascular:  Positive  for leg swelling.  Gastrointestinal: Negative.   Genitourinary: Negative.   Musculoskeletal:  Positive for gait problem.  Skin: Negative.   Psychiatric/Behavioral:  Positive for confusion.    Immunization History  Administered Date(s) Administered   Influenza, High Dose Seasonal PF 01/21/2019   Influenza-Unspecified 12/06/2014, 11/27/2017, 02/01/2020   Moderna Sars-Covid-2 Vaccination 04/13/2019, 05/11/2019, 02/18/2020   Pneumococcal Conjugate-13 05/21/2018   Pneumococcal Polysaccharide-23 05/21/2005   Tdap 06/03/2006, 10/22/2018   Zoster Recombinat (Shingrix) 10/22/2018, 12/24/2018   Pertinent  Health Maintenance Due  Topic Date Due   URINE MICROALBUMIN  Never done   INFLUENZA VACCINE  11/06/2020   PNA vac Low Risk Adult  Completed   Fall Risk  11/11/2019 07/20/2019 10/22/2018 05/12/2018 10/03/2017  Falls in the past year? 1 - 1 1 No  Number falls in past yr: 0 - 1 1 -  Injury with Fall? 1 - 1 0 -  Risk for fall due to : History of fall(s);Impaired balance/gait History of fall(s);Impaired balance/gait;Impaired mobility;Mental status change;Medication side effect History of fall(s);Impaired mobility Impaired mobility;History of fall(s);Impaired balance/gait;Mental status change;Medication side effect -  Follow up Falls evaluation completed Education provided;Falls prevention discussed;Falls evaluation completed Falls evaluation completed;Falls prevention discussed Falls evaluation completed;Falls  prevention discussed;Education provided -  Comment - at [place] - - -   Functional Status Survey:    Vitals:   10/23/20 1331  BP: 124/77  Pulse: 88  Resp: 18  Temp: (!) 97 F (36.1 C)  SpO2: 98%  Weight: 175 lb 9.6 oz (79.7 kg)  Height: 5' 9.8" (1.773 m)   Body mass index is 25.34 kg/m. Physical Exam Vitals reviewed.  Constitutional:      Appearance: Normal appearance.  HENT:     Head: Normocephalic.     Nose: Nose normal.     Mouth/Throat:     Mouth: Mucous membranes are moist.     Pharynx: Oropharynx is clear.  Eyes:     Pupils: Pupils are equal, round, and reactive to light.  Cardiovascular:     Rate and Rhythm: Normal rate and regular rhythm.     Pulses: Normal pulses.     Heart sounds: Normal heart sounds.  Pulmonary:     Effort: Pulmonary effort is normal.     Breath sounds: Normal breath sounds.  Abdominal:     General: Abdomen is flat. Bowel sounds are normal.     Palpations: Abdomen is soft.  Musculoskeletal:        General: Swelling present.     Cervical back: Neck supple.  Skin:    General: Skin is warm.  Neurological:     General: No focal deficit present.     Mental Status: He is alert.  Psychiatric:        Mood and Affect: Mood normal.    Labs reviewed: Recent Labs    07/13/20 0000  NA 139  K 3.6  CL 100  CO2 24*  BUN 20  CREATININE 1.1  CALCIUM 9.1   Recent Labs    07/13/20 0000  AST 33  ALT 8*  ALKPHOS 83  ALBUMIN 4.5   Recent Labs    07/13/20 0000  WBC 7.4  HGB 12.5*  HCT 37*  PLT 232   Lab Results  Component Value Date   TSH 3.60 10/16/2020   No results found for: HGBA1C Lab Results  Component Value Date   CHOL 125 07/13/2020   HDL 60 07/13/2020   LDLCALC 45 07/13/2020   TRIG 100 07/13/2020  Significant Diagnostic Results in last 30 days:  No results found.  Assessment/Plan Parkinson disease ([place]) On Sinemet Per Neurology Dementia due to Parkinson's disease without behavioral disturbance  ([place]) On Aricept Crohn's disease of small intestine without complication ([place]) On Questran  No Active Symptoms now Mixed obsessional thoughts and acts On Paxil now Off Luvox due to Sleepiness More Alert  Orthostatic hypotension On MIdodrine  Coronary artery disease i On Aspirin and Statin Mixed hyperlipidemia LDL good levels Chronic diastolic CHF (congestive heart failure) ([place]) On Demadex Creat stable   Family/ staff Communication:   Labs/tests ordered:

## 2020-11-13 ENCOUNTER — Non-Acute Institutional Stay (SKILLED_NURSING_FACILITY): Payer: PPO | Admitting: Adult Health

## 2020-11-13 DIAGNOSIS — F0280 Dementia in other diseases classified elsewhere without behavioral disturbance: Secondary | ICD-10-CM | POA: Diagnosis not present

## 2020-11-13 DIAGNOSIS — K449 Diaphragmatic hernia without obstruction or gangrene: Secondary | ICD-10-CM | POA: Diagnosis not present

## 2020-11-13 DIAGNOSIS — K5000 Crohn's disease of small intestine without complications: Secondary | ICD-10-CM

## 2020-11-13 DIAGNOSIS — K219 Gastro-esophageal reflux disease without esophagitis: Secondary | ICD-10-CM

## 2020-11-13 DIAGNOSIS — G20 Parkinson's disease: Secondary | ICD-10-CM

## 2020-11-13 DIAGNOSIS — F422 Mixed obsessional thoughts and acts: Secondary | ICD-10-CM | POA: Diagnosis not present

## 2020-11-13 DIAGNOSIS — I951 Orthostatic hypotension: Secondary | ICD-10-CM

## 2020-11-13 DIAGNOSIS — I5032 Chronic diastolic (congestive) heart failure: Secondary | ICD-10-CM

## 2020-11-13 DIAGNOSIS — G20A1 Parkinson's disease without dyskinesia, without mention of fluctuations: Secondary | ICD-10-CM

## 2020-11-15 ENCOUNTER — Encounter: Payer: Self-pay | Admitting: Adult Health

## 2020-11-15 NOTE — Progress Notes (Signed)
Location:  Occupational psychologist of Service:  SNF (31) Provider:   Cindi Carbon, [place] [phone]   [name], MD  Patient Care Team: [name], MD as PCP - General (Internal Medicine) [name], [name], MD as PCP - Cardiology (Cardiology) [name], [name], MD as Consulting Physician (Cardiology) [name], MD as Attending Physician (Gastroenterology) [name], MD as Consulting Physician (Neurology) Community, [facility] ([place]) Royal Hawthorn, NP as Nurse Practitioner (Nurse Practitioner)  Extended Emergency Contact Information Primary Emergency Contact: [name] Address: [address] [place] [address] [place]          [place], [place] [address] [name] of [place] Phone: [phone] Mobile Phone: [phone] Relation: Spouse  Code Status:  DNR Goals of care: Advanced Directive information Advanced Directives 10/23/2020  Does Patient Have a Medical Advance Directive? No  Type of Paramedic of [place];Living will;Out of facility DNR (pink MOST or yellow form)  Does patient want to make changes to medical advance directive? No - Patient declined  Copy of [place] in Chart? Yes - validated most recent copy scanned in chart (See row information)  Pre-existing out of facility DNR order (yellow form or pink MOST form) Yellow form placed in chart (order not valid for inpatient use)     Chief Complaint  Patient presents with   Medical Management of Chronic Issues    HPI:  [name] [name] is a 87 y.o. male seen today for medical management of chronic diseases.    CHF: weights are stable. Trace edema chronic. No issues with sob, pnd, or doe EF 65%  OCD: continues to obsess over personal care routine and cleanliness. On Paxil now. Has home care to help assist with his routine and get to activities on time which has helped. Continues with  periodic dry irritated skin due to excessive cleaning. For my visit he could not stop combing his care to allow me to listen to his lungs.   Dementia due to PD: remains pleasant, able to f/c, participate in activities and routine. Uses a PWC for mobility and can transfer. No delusions or aggression  PD: no issues with rigidity or tremor. Has slow movement and falls with transfers at times   GERD with hiatal hernia: denies any indigestion or abd pain  No issues with syncope or hypotension on midodrine.   Past Medical History:  Diagnosis Date   Cellulitis    Chronic diastolic CHF (congestive heart failure) ([place])    a. EF initially 35-40% after MI 1/09; b. echo 7/08: EF 60%;  c. 11/2014 Echo: EF 65-70%, Gr 1 DD, mild MR.   Coronary artery disease    a. s/p anterior STEMI 04/2007 with BMS-> LAD;  b. Cath 12/2011 patent stent;  c. low risk nuc 04/2014.   Crohn's disease ([place])    Diaphragmatic hernia without mention of obstruction or gangrene    Diverticulosis of colon (without mention of hemorrhage)    Fatty liver    a. on ultrasound of 10/2009   Flatulence, eructation, and gas pain    Fungal infection    Gait disorder    GERD (gastroesophageal reflux disease)    HOH (hard of hearing)    Hearing aids   Hyperlipidemia    Hypertension    Ischemic cardiomyopathy    a. EF initially 35-40% after MI 1/09; b. echo 7/08: EF 60%;  c. 11/2014 Echo: EF 65-70%, Gr 1  DD, mild MR.   Melanoma ([place])    Memory disorder    Orthostatic hypotension    Osteoporosis    Other chronic nonalcoholic liver disease    Other esophagitis    Parkinson's disease ([place])    RBBB 09/02/2013   Regional enteritis of small intestine (HCC)    Renal calculi    Restless leg syndrome 03/22/2015   Ulcerative (chronic) ileocolitis (HCC)    Urinary incontinence    Past Surgical History:  Procedure Laterality Date   APPENDECTOMY     BACK SURGERY     L3, L4, L5   CARDIAC CATHETERIZATION  09/25/06   EF 35-40% but more  recently 60%   CATARACT EXTRACTION     Bilateral   CHOLECYSTECTOMY     Coronary artery stent placement     Melanoma resection     Partial bowel resection     TONSILLECTOMY      No Known Allergies  Outpatient Encounter Medications as of 11/13/2020  Medication Sig   acetaminophen (TYLENOL) 325 MG tablet Take 650 mg by mouth at bedtime. And q 6 hrs prn   aspirin EC 81 MG tablet Take 81 mg by mouth daily.   atorvastatin (LIPITOR) 40 MG tablet Take 40 mg by mouth daily.   calcium carbonate (OS-CAL) 600 MG TABS tablet Take 600 mg by mouth daily with breakfast.   carbidopa-levodopa (SINEMET CR) 50-200 MG tablet Take 1 tablet by mouth 4 (four) times daily.   carbidopa-levodopa (SINEMET IR) 25-100 MG tablet Take 1 tablet by mouth 4 (four) times daily.   Cholecalciferol (VITAMIN D3) 50 MCG (2000 UT) TABS Take 1 tablet by mouth daily.   cholestyramine (QUESTRAN) 4 g packet TAKE 1 PACKET BY MOUTH TWICE DAILY WITH A MEAL   donepezil (ARICEPT) 10 MG tablet TAKE ONE TABLET AT BEDTIME   ipratropium (ATROVENT) 0.03 % nasal spray Place 2 sprays into both nostrils 3 (three) times daily.   lidocaine (XYLOCAINE) 2 % solution Use as directed 15 mLs in the mouth or throat as needed for mouth pain.   midodrine (PROAMATINE) 5 MG tablet Take 1 tablet (5 mg total) by mouth 2 (two) times daily with a meal.   multivitamin-iron-minerals-folic acid (CENTRUM) chewable tablet Chew 1 tablet by mouth daily.   nitroGLYCERIN (NITROSTAT) 0.4 MG SL tablet Place 1 tablet (0.4 mg total) under the tongue every 5 (five) minutes as needed for chest pain. [name] [name] OVERDUE FOR OV PLEASE CALL FOR APPT   pantoprazole (PROTONIX) 40 MG tablet Take 40 mg by mouth daily.   PARoxetine (PAXIL) 10 MG tablet Take 10 mg by mouth 2 (two) times daily.   potassium chloride SA (K-DUR,KLOR-CON) 20 MEQ tablet Take 40 mEq by mouth daily.   Probiotic Product (ALIGN) 4 MG CAPS Take 1 capsule by mouth daily.    sodium chloride (OCEAN) 0.65 % SOLN nasal spray  Place 2 sprays into both nostrils daily as needed for congestion.    torsemide (DEMADEX) 20 MG tablet Take 1 tablet (20 mg total) by mouth daily.   vitamin B-12 (CYANOCOBALAMIN) 1000 MCG tablet Take 1,000 mcg by mouth daily.   No facility-administered encounter medications on file as of 11/13/2020.    Review of Systems  Constitutional:  Negative for activity change, appetite change, chills, diaphoresis, fatigue, fever and unexpected weight change.  Respiratory:  Negative for cough, shortness of breath, wheezing and stridor.   Cardiovascular:  Positive for leg swelling. Negative for chest pain and palpitations.  Gastrointestinal:  Negative for  abdominal distention, abdominal pain, constipation and diarrhea.  Genitourinary:  Negative for difficulty urinating and dysuria.  Musculoskeletal:  Positive for gait problem. Negative for arthralgias, back pain, joint swelling and myalgias.  Neurological:  Negative for dizziness, seizures, syncope, facial asymmetry, speech difficulty, weakness and headaches.  Hematological:  Negative for adenopathy. Does not bruise/bleed easily.  Psychiatric/Behavioral:  Positive for behavioral problems. Negative for agitation and confusion.    Immunization History  Administered Date(s) Administered   Influenza, High Dose Seasonal PF 01/21/2019   Influenza-Unspecified 12/06/2014, 11/27/2017, 02/01/2020   Moderna Sars-Covid-2 Vaccination 04/13/2019, 05/11/2019, 02/18/2020   Pneumococcal Conjugate-13 05/21/2018   Pneumococcal Polysaccharide-23 05/21/2005   Tdap 06/03/2006, 10/22/2018   Zoster Recombinat (Shingrix) 10/22/2018, 12/24/2018   Pertinent  Health Maintenance Due  Topic Date Due   URINE MICROALBUMIN  Never done   INFLUENZA VACCINE  11/06/2020   PNA vac Low Risk Adult  Completed   Fall Risk  11/11/2019 07/20/2019 10/22/2018 05/12/2018 10/03/2017  Falls in the past year? 1 - 1 1 No  Number falls in past yr: 0 - 1 1 -  Injury with Fall? 1 - 1 0 -  Risk for fall  due to : History of fall(s);Impaired balance/gait History of fall(s);Impaired balance/gait;Impaired mobility;Mental status change;Medication side effect History of fall(s);Impaired mobility Impaired mobility;History of fall(s);Impaired balance/gait;Mental status change;Medication side effect -  Follow up Falls evaluation completed Education provided;Falls prevention discussed;Falls evaluation completed Falls evaluation completed;Falls prevention discussed Falls evaluation completed;Falls prevention discussed;Education provided -  Comment - at [place] - - -   Functional Status Survey:    Vitals:   11/15/20 0804  Weight: 175 lb (79.4 kg)   Body mass index is 25.25 kg/m. Physical Exam Vitals and nursing note reviewed.  Constitutional:      General: He is not in acute distress.    Appearance: He is not diaphoretic.  HENT:     Head: Normocephalic and atraumatic.  Neck:     Thyroid: No thyromegaly.     Vascular: No JVD.     Trachea: No tracheal deviation.  Cardiovascular:     Rate and Rhythm: Normal rate and regular rhythm.     Heart sounds: No murmur heard. Pulmonary:     Effort: Pulmonary effort is normal. No respiratory distress.     Breath sounds: Normal breath sounds. No wheezing.  Abdominal:     General: Bowel sounds are normal. There is no distension.     Palpations: Abdomen is soft.     Tenderness: There is no abdominal tenderness.  Musculoskeletal:     Comments: Trace edema BLE  Lymphadenopathy:     Cervical: No cervical adenopathy.  Skin:    General: Skin is warm and dry.  Neurological:     Mental Status: He is alert and oriented to person, place, and time.     Cranial Nerves: No cranial nerve deficit.  Psychiatric:        Mood and Affect: Mood normal.    Labs reviewed: Recent Labs    07/13/20 0000  NA 139  K 3.6  CL 100  CO2 24*  BUN 20  CREATININE 1.1  CALCIUM 9.1   Recent Labs    07/13/20 0000  AST 33  ALT 8*  ALKPHOS 83  ALBUMIN 4.5    Recent Labs    07/13/20 0000  WBC 7.4  HGB 12.5*  HCT 37*  PLT 232   Lab Results  Component Value Date   TSH 3.60 10/16/2020   No results found  for: HGBA1C Lab Results  Component Value Date   CHOL 125 07/13/2020   HDL 60 07/13/2020   LDLCALC 45 07/13/2020   TRIG 100 07/13/2020    Significant Diagnostic Results in last 30 days:  No results found.  Assessment/Plan 1. Mixed obsessional thoughts and acts This continues to be bothersome and not much improved on Paxil. Has tried SSRI and Luvox with minimal benefit.  Homecare has been helpful Followed by psych  2. Crohn's disease of small intestine without complication ([place]) Has periodic loose stool but mostly this is well controlled with no fever or abd pain Continue questran   3. Gastroesophageal reflux disease without esophagitis Controlled with protonix  4. Diaphragmatic hernia without obstruction and without gangrene With gerd Continue protonix 40 mg qd   5. Chronic diastolic CHF (congestive heart failure) (HCC) Compensated Continue torsemide 20 mg qd   6. Orthostatic hypotension No current issues Continue midodrine  7. Parkinson disease ([place]) Followed by neurology on sinemet  8. Dementia due to Parkinson's disease without behavioral disturbance [facility]) MMSE 28/30 10/25/20 Continues on Aricept Continues to benefit from the skilled care setting    Labs/tests ordered:  NA

## 2020-11-21 ENCOUNTER — Ambulatory Visit: Payer: PPO | Admitting: Neurology

## 2020-11-23 DIAGNOSIS — Z85820 Personal history of malignant melanoma of skin: Secondary | ICD-10-CM | POA: Diagnosis not present

## 2020-11-23 DIAGNOSIS — D485 Neoplasm of uncertain behavior of skin: Secondary | ICD-10-CM | POA: Diagnosis not present

## 2020-11-23 DIAGNOSIS — C44222 Squamous cell carcinoma of skin of right ear and external auricular canal: Secondary | ICD-10-CM | POA: Diagnosis not present

## 2020-11-23 DIAGNOSIS — F605 Obsessive-compulsive personality disorder: Secondary | ICD-10-CM | POA: Diagnosis not present

## 2020-11-23 DIAGNOSIS — L570 Actinic keratosis: Secondary | ICD-10-CM | POA: Diagnosis not present

## 2020-11-23 DIAGNOSIS — L821 Other seborrheic keratosis: Secondary | ICD-10-CM | POA: Diagnosis not present

## 2020-11-23 DIAGNOSIS — Z85828 Personal history of other malignant neoplasm of skin: Secondary | ICD-10-CM | POA: Diagnosis not present

## 2020-11-28 ENCOUNTER — Encounter: Payer: Self-pay | Admitting: Neurology

## 2020-11-28 ENCOUNTER — Ambulatory Visit: Payer: PPO | Admitting: Neurology

## 2020-11-28 VITALS — BP 122/71 | HR 97 | Ht 69.0 in | Wt 172.0 lb

## 2020-11-28 DIAGNOSIS — R269 Unspecified abnormalities of gait and mobility: Secondary | ICD-10-CM | POA: Diagnosis not present

## 2020-11-28 DIAGNOSIS — G20 Parkinson's disease: Secondary | ICD-10-CM

## 2020-11-28 DIAGNOSIS — F0280 Dementia in other diseases classified elsewhere without behavioral disturbance: Secondary | ICD-10-CM

## 2020-11-28 DIAGNOSIS — G20A1 Parkinson's disease without dyskinesia, without mention of fluctuations: Secondary | ICD-10-CM

## 2020-11-28 NOTE — Progress Notes (Signed)
Reason for visit: Parkinson's disease, memory disorder, gait disorder  [name] [name] is an 87 y.o. male  History of present illness:  [name] [name] is an 87 year old right-handed white male with a history of Parkinson's disease.  He resides at [facility].  He has lost his ability to ambulate independently, he will walk some with assistance, they use a PT belt and walk behind him.  He has a very definite tendency to lean backwards.  He has not had any falls however.  He is sleeping well at night, and he eats well.  He has occasional tremors of the right arm.  He continues to have OCD behavior, he will take multiple baths during the day.  The wife comes with him today, she believes there has been some progression of memory issues.  He does not remember things that were told him earlier in the day.  He denies any hallucinations or agitation.  He comes to the office today for further evaluation.  He remains on Sinemet CR 50/200 mg 4 times daily and Sinemet IR 25/100 mg tablets, 1 tablet 4 times daily.  He tolerates his medications well.  He sleeps well at night.  Past Medical History:  Diagnosis Date   Cellulitis    Chronic diastolic CHF (congestive heart failure) ([place])    a. EF initially 35-40% after MI 1/09; b. echo 7/08: EF 60%;  c. 11/2014 Echo: EF 65-70%, Gr 1 DD, mild MR.   Coronary artery disease    a. s/p anterior STEMI 04/2007 with BMS-> LAD;  b. Cath 12/2011 patent stent;  c. low risk nuc 04/2014.   Crohn's disease ([place])    Diaphragmatic hernia without mention of obstruction or gangrene    Diverticulosis of colon (without mention of hemorrhage)    Fatty liver    a. on ultrasound of 10/2009   Flatulence, eructation, and gas pain    Fungal infection    Gait disorder    GERD (gastroesophageal reflux disease)    HOH (hard of hearing)    Hearing aids   Hyperlipidemia    Hypertension    Ischemic cardiomyopathy    a. EF initially 35-40% after MI 1/09; b. echo 7/08: EF  60%;  c. 11/2014 Echo: EF 65-70%, Gr 1 DD, mild MR.   Melanoma ([place])    Memory disorder    Orthostatic hypotension    Osteoporosis    Other chronic nonalcoholic liver disease    Other esophagitis    Parkinson's disease ([place])    RBBB 09/02/2013   Regional enteritis of small intestine (HCC)    Renal calculi    Restless leg syndrome 03/22/2015   Ulcerative (chronic) ileocolitis ([facility])    Urinary incontinence     Past Surgical History:  Procedure Laterality Date   APPENDECTOMY     BACK SURGERY     L3, L4, L5   CARDIAC CATHETERIZATION  09/25/06   EF 35-40% but more recently 60%   CATARACT EXTRACTION     Bilateral   CHOLECYSTECTOMY     Coronary artery stent placement     Melanoma resection     Partial bowel resection     TONSILLECTOMY      Family History  Problem Relation Age of Onset   Heart failure Mother    Heart attack Mother    Hypertension Mother    Emphysema Father    Heart disease Brother    Heart disease Brother    Coronary artery disease Other  Colon cancer Brother        older at dx   Stomach cancer Neg Hx     Social history:  reports that he has never smoked. He has never used smokeless tobacco. He reports current alcohol use of about 2.0 - 3.0 standard drinks per week. He reports that he does not use drugs.   No Known Allergies  Medications:  Prior to Admission medications   Medication Sig Start Date End Date Taking? Authorizing Provider  acetaminophen (TYLENOL) 325 MG tablet Take 650 mg by mouth at bedtime. And q 6 hrs prn    [provider]  aspirin EC 81 MG tablet Take 81 mg by mouth daily.    [provider]  atorvastatin (LIPITOR) 40 MG tablet Take 40 mg by mouth daily.    [provider]  calcium carbonate (OS-CAL) 600 MG TABS tablet Take 600 mg by mouth daily with breakfast.    [provider]  carbidopa-levodopa (SINEMET CR) 50-200 MG tablet Take 1 tablet by mouth 4 (four) times daily. 10/03/17   [name], MD  carbidopa-levodopa (SINEMET IR) 25-100 MG tablet Take 1 tablet by mouth 4 (four) times daily.    [provider]  Cholecalciferol (VITAMIN D3) 50 MCG (2000 UT) TABS Take 1 tablet by mouth daily.    [provider]  cholestyramine (QUESTRAN) 4 g packet TAKE 1 PACKET BY MOUTH TWICE DAILY WITH A MEAL 01/16/17   [name], MD  donepezil (ARICEPT) 10 MG tablet TAKE ONE TABLET AT BEDTIME 12/10/17   [name], MD  ipratropium (ATROVENT) 0.03 % nasal spray Place 2 sprays into both nostrils 3 (three) times daily.    [provider]  lidocaine (XYLOCAINE) 2 % solution Use as directed 15 mLs in the mouth or throat as needed for mouth pain.    [provider]  midodrine (PROAMATINE) 5 MG tablet Take 1 tablet (5 mg total) by mouth 2 (two) times daily with a meal. 11/23/14   Dunn, [name], PA-C  multivitamin-iron-minerals-folic acid (CENTRUM) chewable tablet Chew 1 tablet by mouth daily.    [provider]  nitroGLYCERIN (NITROSTAT) 0.4 MG SL tablet Place 1 tablet (0.4 mg total) under the tongue every 5 (five) minutes as needed for chest pain. PT OVERDUE FOR OV PLEASE CALL FOR APPT 07/07/17   [name], [name], MD  pantoprazole (PROTONIX) 40 MG tablet Take 40 mg by mouth daily. 12/30/14   [provider]  PARoxetine (PAXIL) 10 MG tablet Take 10 mg by mouth 2 (two) times daily.    [provider]  potassium chloride SA (K-DUR,KLOR-CON) 20 MEQ tablet Take 40 mEq by mouth daily.    [provider]  Probiotic Product (ALIGN) 4 MG CAPS Take 1 capsule by mouth daily.     [provider]  sodium chloride (OCEAN) 0.65 % SOLN nasal spray Place 2 sprays into both nostrils daily as needed for congestion.     [provider]  torsemide (DEMADEX) 20 MG tablet Take 1 tablet (20 mg total) by mouth daily. 11/23/14   Dunn, [name], PA-C  vitamin B-12 (CYANOCOBALAMIN) 1000 MCG tablet Take 1,000 mcg by mouth daily.    [provider]    ROS:  Out of a complete 14 system review of symptoms, the patient complains only of the following symptoms, and all other reviewed systems are negative.  Walking difficulty Memory troubles OCD behavior  Blood pressure 122/71, pulse 97, height  5' 9"  (1.753 m), weight 172 lb (78 kg).  Physical Exam  General: The patient is alert and cooperative at the time of the examination.  Skin: No significant peripheral edema is noted.   Neurologic Exam  Mental status: The patient is alert and oriented x 3 at the time of the examination. The Mini-Mental status examination done today shows a total score of 27/30.   Cranial nerves: Facial symmetry is present. Speech is normal, no aphasia or dysarthria is noted. Extraocular movements are full. Visual fields are full.  Masking of the face is seen.  Motor: The patient has good strength in all 4 extremities.  Sensory examination: Soft touch sensation is symmetric on the face, arms, and legs.  Coordination: The patient has good finger-nose-finger and heel-to-shin bilaterally.  Occasional resting tremor noted with the right arm.  Gait and station: The patient requires assistance with standing, once up, is able to take a few steps with examiner, takes short shuffling steps, has a very definite tendency to lean backwards.  He is able to stand independently for short period of time.  Tandem gait was not attempted.  Reflexes: Deep tendon reflexes are symmetric.   Assessment/Plan:  1.  Parkinson's disease  2.  Gait disorder  3.  Memory disorder  I do not believe that a medication adjustment is likely to improve his balance and ability to ambulate.  I will keep the current medication regimen as is.  The patient will follow up here in 5 to 6 months.  In the future, this patient can be followed by Dr. Leta Baptist.  We will follow the memory issues over time, he is on Aricept.  Jill Alexanders MD 11/28/2020 2:43 PM  [facility] [address] [place], [place] [address]  Phone [phone] Fax [phone]

## 2020-12-07 ENCOUNTER — Encounter: Payer: Self-pay | Admitting: Adult Health

## 2020-12-07 ENCOUNTER — Non-Acute Institutional Stay (INDEPENDENT_AMBULATORY_CARE_PROVIDER_SITE_OTHER): Payer: PPO | Admitting: Adult Health

## 2020-12-07 DIAGNOSIS — Z0000 Encounter for general adult medical examination without abnormal findings: Secondary | ICD-10-CM | POA: Diagnosis not present

## 2020-12-07 NOTE — Patient Instructions (Addendum)
[name] [name] , Thank you for taking time to come for your Medicare Wellness Visit. I appreciate your ongoing commitment to your health goals. Please review the following plan we discussed and let me know if I can assist you in the future.   Screening recommendations/referrals: Colonoscopy aged out Recommended yearly ophthalmology/optometry visit for glaucoma screening and checkup Recommended yearly dental visit for hygiene and checkup  Vaccinations: Influenza vaccine up to date but will take next vaccine in Oct at [facility]  Pneumococcal vaccine up to date Tdap vaccine up to date Shingles vaccine up to date     Advanced directives: reviewed   Conditions/risks identified: fall risk and cardiac risk   Next appointment: seen monthly at [facility] 65 Years and Older, Male Preventive care refers to lifestyle choices and visits with your health care provider that can promote health and wellness. What does preventive care include? A yearly physical exam. This is also called an annual well check. Dental exams once or twice a year. Routine eye exams. Ask your health care provider how often you should have your eyes checked. Personal lifestyle choices, including: Daily care of your teeth and gums. Regular physical activity. Eating a healthy diet. Avoiding tobacco and drug use. Limiting alcohol use. Practicing safe sex. Taking low doses of aspirin every day. Taking vitamin and mineral supplements as recommended by your health care provider. What happens during an annual well check? The services and screenings done by your health care provider during your annual well check will depend on your age, overall health, lifestyle risk factors, and family history of disease. Counseling  Your health care provider may ask you questions about your: Alcohol use. Tobacco use. Drug use. Emotional well-being. Home and relationship well-being. Sexual activity. Eating habits. History  of falls. Memory and ability to understand (cognition). Work and work Statistician. Screening  You may have the following tests or measurements: Height, weight, and BMI. Blood pressure. Lipid and cholesterol levels. These may be checked every 5 years, or more frequently if you are over 38 years old. Skin check. Lung cancer screening. You may have this screening every year starting at age 97 if you have a 30-pack-year history of smoking and currently smoke or have quit within the past 15 years. Fecal occult blood test (FOBT) of the stool. You may have this test every year starting at age 58. Flexible sigmoidoscopy or colonoscopy. You may have a sigmoidoscopy every 5 years or a colonoscopy every 10 years starting at age 53. Prostate cancer screening. Recommendations will vary depending on your family history and other risks. Hepatitis C blood test. Hepatitis B blood test. Sexually transmitted disease (STD) testing. Diabetes screening. This is done by checking your blood sugar (glucose) after you have not eaten for a while (fasting). You may have this done every 1-3 years. Abdominal aortic aneurysm (AAA) screening. You may need this if you are a current or former smoker. Osteoporosis. You may be screened starting at age 58 if you are at high risk. Talk with your health care provider about your test results, treatment options, and if necessary, the need for more tests. Vaccines  Your health care provider may recommend certain vaccines, such as: Influenza vaccine. This is recommended every year. Tetanus, diphtheria, and acellular pertussis (Tdap, Td) vaccine. You may need a Td booster every 10 years. Zoster vaccine. You may need this after age 57. Pneumococcal 13-valent conjugate (PCV13) vaccine. One dose is recommended after age 69. Pneumococcal polysaccharide (PPSV23) vaccine. One  dose is recommended after age 30. Talk to your health care provider about which screenings and vaccines you need  and how often you need them. This information is not intended to replace advice given to you by your health care provider. Make sure you discuss any questions you have with your health care provider. Document Released: 04/21/2015 Document Revised: 12/13/2015 Document Reviewed: 01/24/2015 Elsevier Interactive Patient Education  2017 [place] Prevention in the Home Falls can cause injuries. They can happen to people of all ages. There are many things you can do to make your home safe and to help prevent falls. What can I do on the outside of my home? Regularly fix the edges of walkways and driveways and fix any cracks. Remove anything that might make you trip as you walk through a door, such as a raised step or threshold. Trim any bushes or trees on the path to your home. Use bright outdoor lighting. Clear any walking paths of anything that might make someone trip, such as rocks or tools. Regularly check to see if handrails are loose or broken. Make sure that both sides of any steps have handrails. Any raised decks and porches should have guardrails on the edges. Have any leaves, snow, or ice cleared regularly. Use sand or salt on walking paths during winter. Clean up any spills in your garage right away. This includes oil or grease spills. What can I do in the bathroom? Use night lights. Install grab bars by the toilet and in the tub and shower. Do not use towel bars as grab bars. Use non-skid mats or decals in the tub or shower. If you need to sit down in the shower, use a plastic, non-slip stool. Keep the floor dry. Clean up any water that spills on the floor as soon as it happens. Remove soap buildup in the tub or shower regularly. Attach bath mats securely with double-sided non-slip rug tape. Do not have throw rugs and other things on the floor that can make you trip. What can I do in the bedroom? Use night lights. Make sure that you have a light by your bed that is easy  to reach. Do not use any sheets or blankets that are too big for your bed. They should not hang down onto the floor. Have a firm chair that has side arms. You can use this for support while you get dressed. Do not have throw rugs and other things on the floor that can make you trip. What can I do in the kitchen? Clean up any spills right away. Avoid walking on wet floors. Keep items that you use a lot in easy-to-reach places. If you need to reach something above you, use a strong step stool that has a grab bar. Keep electrical cords out of the way. Do not use floor polish or wax that makes floors slippery. If you must use wax, use non-skid floor wax. Do not have throw rugs and other things on the floor that can make you trip. What can I do with my stairs? Do not leave any items on the stairs. Make sure that there are handrails on both sides of the stairs and use them. Fix handrails that are broken or loose. Make sure that handrails are as long as the stairways. Check any carpeting to make sure that it is firmly attached to the stairs. Fix any carpet that is loose or worn. Avoid having throw rugs at the top or bottom of the  stairs. If you do have throw rugs, attach them to the floor with carpet tape. Make sure that you have a light switch at the top of the stairs and the bottom of the stairs. If you do not have them, ask someone to add them for you. What else can I do to help prevent falls? Wear shoes that: Do not have high heels. Have rubber bottoms. Are comfortable and fit you well. Are closed at the toe. Do not wear sandals. If you use a stepladder: Make sure that it is fully opened. Do not climb a closed stepladder. Make sure that both sides of the stepladder are locked into place. Ask someone to hold it for you, if possible. Clearly mark and make sure that you can see: Any grab bars or handrails. First and last steps. Where the edge of each step is. Use tools that help you move  around (mobility aids) if they are needed. These include: Canes. Walkers. Scooters. Crutches. Turn on the lights when you go into a dark area. Replace any light bulbs as soon as they burn out. Set up your furniture so you have a clear path. Avoid moving your furniture around. If any of your floors are uneven, fix them. If there are any pets around you, be aware of where they are. Review your medicines with your doctor. Some medicines can make you feel dizzy. This can increase your chance of falling. Ask your doctor what other things that you can do to help prevent falls. This information is not intended to replace advice given to you by your health care provider. Make sure you discuss any questions you have with your health care provider. Document Released: 01/19/2009 Document Revised: 08/31/2015 Document Reviewed: 04/29/2014 Elsevier Interactive Patient Education  2017 [employer].

## 2020-12-07 NOTE — Progress Notes (Signed)
Subjective:   [name] [name] is a 87 y.o. male who presents for Medicare Annual/Subsequent preventive examination.  Review of Systems           Objective:    Today's Vitals   12/07/20 1208  BP: (!) 145/78  Pulse: 94  Resp: 18  Temp: (!) 97.2 F (36.2 C)  SpO2: 92%  Weight: 171 lb (77.6 kg)  Height: 5' 9"  (1.753 m)   Body mass index is 25.25 kg/m.  Advanced Directives 12/07/2020 12/07/2020 10/23/2020 10/02/2020 08/25/2020 07/31/2020 06/02/2020  Does Patient Have a Medical Advance Directive? Yes Yes No No Yes Yes Yes  Type of Paramedic of [place];Out of facility DNR (pink MOST or yellow form);Living will [place];Living will;Out of facility DNR (pink MOST or yellow form) [place];Living will;Out of facility DNR (pink MOST or yellow form) [place];Living will;Out of facility DNR (pink MOST or yellow form) [place];Living will;Out of facility DNR (pink MOST or yellow form) [place];Living will;Out of facility DNR (pink MOST or yellow form) [place];Living will;Out of facility DNR (pink MOST or yellow form)  Does patient want to make changes to medical advance directive? - No - Patient declined No - Patient declined No - Patient declined No - Patient declined No - Patient declined No - Patient declined  Copy of [place] in Chart? Yes - validated most recent copy scanned in chart (See row information) Yes - validated most recent copy scanned in chart (See row information) Yes - validated most recent copy scanned in chart (See row information) Yes - validated most recent copy scanned in chart (See row information) Yes - validated most recent copy scanned in chart (See row information) Yes - validated most recent copy scanned in chart (See row information) Yes - validated most recent copy scanned in chart (See row information)   Pre-existing out of facility DNR order (yellow form or pink MOST form) - - Yellow form placed in chart (order not valid for inpatient use) Yellow form placed in chart (order not valid for inpatient use) - Yellow form placed in chart (order not valid for inpatient use) Yellow form placed in chart (order not valid for inpatient use)    Current Medications (verified) Outpatient Encounter Medications as of 12/07/2020  Medication Sig   acetaminophen (TYLENOL) 325 MG tablet Take 650 mg by mouth at bedtime. And q 6 hrs prn   aspirin EC 81 MG tablet Take 81 mg by mouth daily.   atorvastatin (LIPITOR) 40 MG tablet Take 40 mg by mouth daily.   calcium carbonate (OS-CAL) 600 MG TABS tablet Take 600 mg by mouth daily with breakfast.   carbidopa-levodopa (SINEMET CR) 50-200 MG tablet Take 1 tablet by mouth 4 (four) times daily.   carbidopa-levodopa (SINEMET IR) 25-100 MG tablet Take 1 tablet by mouth 4 (four) times daily.   Cholecalciferol (VITAMIN D3) 50 MCG (2000 UT) TABS Take 1 tablet by mouth daily.   cholestyramine (QUESTRAN) 4 g packet TAKE 1 PACKET BY MOUTH TWICE DAILY WITH A MEAL   diclofenac Sodium (VOLTAREN) 1 % GEL Apply topically.   donepezil (ARICEPT) 10 MG tablet TAKE ONE TABLET AT BEDTIME   ipratropium (ATROVENT) 0.03 % nasal spray Place 2 sprays into both nostrils 3 (three) times daily.   lidocaine (XYLOCAINE) 2 % solution Use as directed 15 mLs in the mouth or throat as needed for mouth pain.  midodrine (PROAMATINE) 5 MG tablet Take 1 tablet (5 mg total) by mouth 2 (two) times daily with a meal.   multivitamin-iron-minerals-folic acid (CENTRUM) chewable tablet Chew 1 tablet by mouth daily.   nitroGLYCERIN (NITROSTAT) 0.4 MG SL tablet Place 1 tablet (0.4 mg total) under the tongue every 5 (five) minutes as needed for chest pain. PT OVERDUE FOR OV PLEASE CALL FOR APPT   pantoprazole (PROTONIX) 40 MG tablet Take 40 mg by mouth daily.   PARoxetine (PAXIL) 20 MG tablet Take 20 mg by mouth 2  (two) times daily.   potassium chloride SA (K-DUR,KLOR-CON) 20 MEQ tablet Take 40 mEq by mouth daily.   Probiotic Product (ALIGN) 4 MG CAPS Take 1 capsule by mouth daily.    sodium chloride (OCEAN) 0.65 % SOLN nasal spray Place 2 sprays into both nostrils daily as needed for congestion.    torsemide (DEMADEX) 20 MG tablet Take 1 tablet (20 mg total) by mouth daily.   vitamin B-12 (CYANOCOBALAMIN) 1000 MCG tablet Take 1,000 mcg by mouth daily.   [DISCONTINUED] PARoxetine (PAXIL) 10 MG tablet Take 10 mg by mouth 2 (two) times daily.   No facility-administered encounter medications on file as of 12/07/2020.    Allergies (verified) Patient has no known allergies.   History: Past Medical History:  Diagnosis Date   Cellulitis    Chronic diastolic CHF (congestive heart failure) ([place])    a. EF initially 35-40% after MI 1/09; b. echo 7/08: EF 60%;  c. 11/2014 Echo: EF 65-70%, Gr 1 DD, mild MR.   Coronary artery disease    a. s/p anterior STEMI 04/2007 with BMS-> LAD;  b. Cath 12/2011 patent stent;  c. low risk nuc 04/2014.   Crohn's disease ([place])    Diaphragmatic hernia without mention of obstruction or gangrene    Diverticulosis of colon (without mention of hemorrhage)    Fatty liver    a. on ultrasound of 10/2009   Flatulence, eructation, and gas pain    Fungal infection    Gait disorder    GERD (gastroesophageal reflux disease)    HOH (hard of hearing)    Hearing aids   Hyperlipidemia    Hypertension    Ischemic cardiomyopathy    a. EF initially 35-40% after MI 1/09; b. echo 7/08: EF 60%;  c. 11/2014 Echo: EF 65-70%, Gr 1 DD, mild MR.   Melanoma ([place])    Memory disorder    Orthostatic hypotension    Osteoporosis    Other chronic nonalcoholic liver disease    Other esophagitis    Parkinson's disease ([place])    RBBB 09/02/2013   Regional enteritis of small intestine (HCC)    Renal calculi    Restless leg syndrome 03/22/2015   Ulcerative (chronic) ileocolitis (HCC)    Urinary  incontinence    Past Surgical History:  Procedure Laterality Date   APPENDECTOMY     BACK SURGERY     L3, L4, L5   CARDIAC CATHETERIZATION  09/25/06   EF 35-40% but more recently 60%   CATARACT EXTRACTION     Bilateral   CHOLECYSTECTOMY     Coronary artery stent placement     Melanoma resection     Partial bowel resection     TONSILLECTOMY     Family History  Problem Relation Age of Onset   Heart failure Mother    Heart attack Mother    Hypertension Mother    Emphysema Father    Heart disease Brother  Heart disease Brother    Coronary artery disease Other    Colon cancer Brother        older at dx   Stomach cancer Neg Hx    Social History   Socioeconomic History   Marital status: Married    Spouse name: [name]   Number of children: 3   Years of education: 16   Highest education level: Bachelor's degree (e.g., BA, AB, BS)  Occupational History   Occupation: CEO-retired    Employer: [employer]   Occupation: Licensed conveyancer: [employer]  Tobacco Use   Smoking status: Never   Smokeless tobacco: Never  Vaping Use   Vaping Use: Never used  Substance and Sexual Activity   Alcohol use: Yes    Alcohol/week: 2.0 - 3.0 standard drinks    Types: 2 - 3 Standard drinks or equivalent per week    Comment: occassionally   Drug use: No   Sexual activity: Not Currently  Other Topics Concern   Not on file  Social History Narrative   Lives w/ his wife at [place]   Patient is right handed.   Patient drinks very little caffeine.   Social Determinants of Health   Financial Resource Strain: Not on file  Food Insecurity: Not on file  Transportation Needs: Not on file  Physical Activity: Not on file  Stress: Not on file  Social Connections: Not on file    Tobacco Counseling Counseling given: Not Answered   Clinical Intake:  Pre-visit preparation completed: No  Pain : No/denies pain     BMI - recorded: 25.25 Nutritional  Status: BMI 25 -29 Overweight Diabetes: No  How often do you need to have someone help you when you read instructions, pamphlets, or other written materials from your doctor or pharmacy?: 5 - Always  Diabetic?no  Interpreter Needed?: No  Information entered by :: Royal Hawthorn NP   Activities of Daily Living No flowsheet data found.  Patient Care Team: [name], MD as PCP - General (Internal Medicine) [name], [name], MD as PCP - Cardiology (Cardiology) [name], [name], MD as Consulting Physician (Cardiology) [name], MD as Attending Physician (Gastroenterology) [name], MD as Consulting Physician (Neurology) Community, [facility] ([place]) Royal Hawthorn, NP as Nurse Practitioner (Nurse Practitioner)  Indicate any recent Medical Services you may have received from other than [facility] providers in the past year (date may be approximate).     Assessment:   This is a routine wellness examination for [facility].  Hearing/Vision screen No results found.  Dietary issues and exercise activities discussed:     Goals Addressed   None    Depression Screen PHQ 2/9 Scores 12/08/2020 11/11/2019 10/22/2018 05/20/2018 12/18/2017 12/29/2014  PHQ - 2 Score 0 0 0 0 0 0    Fall Risk Fall Risk  12/07/2020 11/11/2019 07/20/2019 10/22/2018 05/12/2018  Falls in the past year? 1 1 - 1 1  Number falls in past yr: 1 0 - 1 1  Injury with Fall? 1 1 - 1 0  Risk for fall due to : Impaired balance/gait;History of fall(s) History of fall(s);Impaired balance/gait History of fall(s);Impaired balance/gait;Impaired mobility;Mental status change;Medication side effect History of fall(s);Impaired mobility Impaired mobility;History of fall(s);Impaired balance/gait;Mental status change;Medication side effect  Follow up Falls evaluation completed;Follow up appointment Falls evaluation completed Education provided;Falls prevention discussed;Falls evaluation completed Falls  evaluation completed;Falls prevention discussed Falls evaluation completed;Falls prevention discussed;Education provided  Comment - -  at [place]:  Any stairs in or around the home? No  If so, are there any without handrails? No  Home free of loose throw rugs in walkways, pet beds, electrical cords, etc? Yes  Adequate lighting in your home to reduce risk of falls? Yes   ASSISTIVE DEVICES UTILIZED TO PREVENT FALLS:  Life alert? No  Use of a cane, walker or w/c? Yes  Grab bars in the bathroom? Yes  Shower chair or bench in shower? Yes  Elevated toilet seat or a handicapped toilet? Yes   TIMED UP AND GO:  Was the test performed? No .  Length of time to ambulate 10 feet: na sec.   Gait unsteady without use of assistive device, provider informed and interventions were implemented  Cognitive Function: MMSE - Mini Mental State Exam 12/07/2020 11/28/2020 06/15/2020 11/11/2019 09/30/2019  Not completed: - - - - -  Orientation to time 3 4 3 5 5   Orientation to Place 5 5 5 4 5   Registration 3 3 3 3 3   Attention/ Calculation 5 4 1 3 2   Recall 3 2 2 2 3   Language- name 2 objects 2 2 2 2 2   Language- repeat 1 1 1 1 1   Language- follow 3 step command 3 3 3 3 3   Language- read & follow direction 1 1 1 1 1   Write a sentence 1 1 1 1 1   Copy design 1 1 1 1 1   Total score 28 27 23 26 27         Immunizations Immunization History  Administered Date(s) Administered   Influenza, High Dose Seasonal PF 01/21/2019   Influenza-Unspecified 12/06/2014, 11/27/2017, 02/01/2020   Moderna SARS-COV2 Booster Vaccination 11/28/2020   Moderna Sars-Covid-2 Vaccination 04/13/2019, 05/11/2019, 02/18/2020   Pneumococcal Conjugate-13 05/21/2018   Pneumococcal Polysaccharide-23 05/21/2005   Tdap 06/03/2006, 10/22/2018   Zoster Recombinat (Shingrix) 10/22/2018, 12/24/2018    TDAP status: Up to date  Flu Vaccine status: Up to date  Pneumococcal vaccine  status: Up to date  Covid-19 vaccine status: Completed vaccines  Qualifies for Shingles Vaccine? Yes   Zostavax completed Yes   Shingrix Completed?: Yes  Screening Tests Health Maintenance  Topic Date Due   INFLUENZA VACCINE  11/06/2020   URINE MICROALBUMIN  01/06/2021 (Originally 01/07/1939)   COVID-19 Vaccine (5 - Booster for Moderna series) 03/30/2021   TETANUS/TDAP  10/21/2028   PNA vac Low Risk Adult  Completed   Zoster Vaccines- Shingrix  Completed   HPV VACCINES  Aged Out    Health Maintenance  Health Maintenance Due  Topic Date Due   INFLUENZA VACCINE  11/06/2020    Colorectal cancer screening: No longer required.   Lung Cancer Screening: (Low Dose CT Chest recommended if Age 3-80 years, 30 pack-year currently smoking OR have quit w/in 15years.) does not qualify.   Lung Cancer Screening Referral: na  Additional Screening:  Hepatitis C Screening: does not qualify; Completed na  Vision Screening: Recommended annual ophthalmology exams for early detection of glaucoma and other disorders of the eye. Is the patient up to date with their annual eye exam?  Yes  Who is the provider or what is the name of the office in which the patient attends annual eye exams? [facility] If pt is not established with a provider, would they like to be referred to a provider to establish care? No .   Dental Screening: Recommended annual dental exams for proper oral  hygiene  Community Resource Referral / Chronic Care Management: CRR required this visit?  No   CCM required this visit?  No      Plan:     I have personally reviewed and noted the following in the patient's chart:   Medical and social history Use of alcohol, tobacco or illicit drugs  Current medications and supplements including opioid prescriptions. Patient is not currently taking opioid prescriptions. Functional ability and status Nutritional status Physical activity Advanced directives List of other  physicians Hospitalizations, surgeries, and ER visits in previous 12 months Vitals Screenings to include cognitive, depression, and falls Referrals and appointments  In addition, I have reviewed and discussed with patient certain preventive protocols, quality metrics, and best practice recommendations. A written personalized care plan for preventive services as well as general preventive health recommendations were provided to patient.     Royal Hawthorn, NP   12/08/2020   Nurse Notes: na

## 2020-12-19 ENCOUNTER — Encounter: Payer: Self-pay | Admitting: Orthopedic Surgery

## 2020-12-19 ENCOUNTER — Non-Acute Institutional Stay (SKILLED_NURSING_FACILITY): Payer: PPO | Admitting: Orthopedic Surgery

## 2020-12-19 DIAGNOSIS — I951 Orthostatic hypotension: Secondary | ICD-10-CM

## 2020-12-19 DIAGNOSIS — I2510 Atherosclerotic heart disease of native coronary artery without angina pectoris: Secondary | ICD-10-CM | POA: Diagnosis not present

## 2020-12-19 DIAGNOSIS — K219 Gastro-esophageal reflux disease without esophagitis: Secondary | ICD-10-CM

## 2020-12-19 DIAGNOSIS — F422 Mixed obsessional thoughts and acts: Secondary | ICD-10-CM | POA: Diagnosis not present

## 2020-12-19 DIAGNOSIS — K5000 Crohn's disease of small intestine without complications: Secondary | ICD-10-CM | POA: Diagnosis not present

## 2020-12-19 DIAGNOSIS — G20 Parkinson's disease: Secondary | ICD-10-CM | POA: Diagnosis not present

## 2020-12-19 DIAGNOSIS — I5032 Chronic diastolic (congestive) heart failure: Secondary | ICD-10-CM | POA: Diagnosis not present

## 2020-12-19 DIAGNOSIS — F0280 Dementia in other diseases classified elsewhere without behavioral disturbance: Secondary | ICD-10-CM

## 2020-12-19 NOTE — Progress Notes (Signed)
Location:  [facility] Room Number: 400-Q Place of Service:  SNF (31) Provider:  Yvonna Alanis, NP   Patient Care Team: [name], MD as PCP - General (Internal Medicine) [name], [name], MD as PCP - Cardiology (Cardiology) [name], [name], MD as Consulting Physician (Cardiology) [name], MD as Attending Physician (Gastroenterology) [name], MD as Consulting Physician (Neurology) Community, [facility] ([place]) Royal Hawthorn, NP as Nurse Practitioner (Nurse Practitioner)  Extended Emergency Contact Information Primary Emergency Contact: [name] Address: [address] [place] [place]          [place], [place] [address] [name] of [place] Phone: [phone] Mobile Phone: [phone] Relation: Spouse  Code Status:  DNR Goals of care: Advanced Directive information Advanced Directives 12/19/2020  Does Patient Have a Medical Advance Directive? Yes  Type of Paramedic of [place];Living will;Out of facility DNR (pink MOST or yellow form)  Does patient want to make changes to medical advance directive? No - Patient declined  Copy of [place] in Chart? Yes - validated most recent copy scanned in chart (See row information)  Pre-existing out of facility DNR order (yellow form or pink MOST form) Yellow form placed in chart (order not valid for inpatient use)     Chief Complaint  Patient presents with   Medical Management of Chronic Issues    Routine visit. Discuss need for flu vaccine or exclude.     HPI:  Pt is a 87 y.o. male seen today for medical management of chronic diseases.    He currently resides on the skilled nursing unit at [facility] 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   CHF: LV EF 65-75% 2016, no recent weight  fluctuations or sob, mild edema- sits in wheelchair most of day, torsemide daily, regular diet.  OCD: followed by Dr. Casimiro Needle, obsessed with personal hygiene and care associated with it, started on Paxil recently, behaviors have started to improve.  Parkinson's dementia: no recent behavioral outbursts, very pleasant today, enjoyed discussing his favorite football teams and photos in his room, Ambulates with PWC, donepezil qhs, MMSE 28/30 10/2020.   Parkinson's: recently seen by neuro 08/23 no adjustments to carbidopa, recommends f/u in 5-6 months, decline in ambulation and balance in past year, using PWC, some falls noted with transfers.  GERD:- hgb 12.5 07/13/2020, Protonix daily.  Hypotension- no recent issues with bp, midodrine bid.  Crohn's: no recent flares, occasional loose stool, questran daily   08/22 skin biopsy to left arm x 2. Areas healed, no complaints. Continues to be followed by dermatology.   Recent blood pressures:  09/10- 146/80  09/08- 114/73  09/07- 113/71  Recent weights:  08/19- 171 lbs  07/22- 174.6 lbs  06/01- 175.4 lbs   Nurse does not report any concerns, vitals stable.    Past Medical History:  Diagnosis Date   Cellulitis    Chronic diastolic CHF (congestive heart failure) ([place])    a. EF initially 35-40% after MI 1/09; b. echo 7/08: EF 60%;  c. 11/2014 Echo: EF 65-70%, Gr 1 DD, mild MR.   Coronary artery disease    a. s/p anterior STEMI 04/2007 with BMS-> LAD;  b. Cath 12/2011 patent stent;  c. low risk nuc 04/2014.   Crohn's disease ([place])    Diaphragmatic hernia without mention of obstruction or gangrene    Diverticulosis  of colon (without mention of hemorrhage)    Fatty liver    a. on ultrasound of 10/2009   Flatulence, eructation, and gas pain    Fungal infection    Gait disorder    GERD (gastroesophageal reflux disease)    HOH (hard of hearing)    Hearing aids   Hyperlipidemia    Hypertension    Ischemic cardiomyopathy    a. EF initially 35-40%  after MI 1/09; b. echo 7/08: EF 60%;  c. 11/2014 Echo: EF 65-70%, Gr 1 DD, mild MR.   Melanoma ([place])    Memory disorder    Orthostatic hypotension    Osteoporosis    Other chronic nonalcoholic liver disease    Other esophagitis    Parkinson's disease ([place])    RBBB 09/02/2013   Regional enteritis of small intestine (HCC)    Renal calculi    Restless leg syndrome 03/22/2015   Ulcerative (chronic) ileocolitis (HCC)    Urinary incontinence    Past Surgical History:  Procedure Laterality Date   APPENDECTOMY     BACK SURGERY     L3, L4, L5   CARDIAC CATHETERIZATION  09/25/06   EF 35-40% but more recently 60%   CATARACT EXTRACTION     Bilateral   CHOLECYSTECTOMY     Coronary artery stent placement     Melanoma resection     Partial bowel resection     TONSILLECTOMY      No Known Allergies  Outpatient Encounter Medications as of 12/19/2020  Medication Sig   acetaminophen (TYLENOL) 325 MG tablet Take 650 mg by mouth at bedtime. And q 6 hrs prn   aspirin EC 81 MG tablet Take 81 mg by mouth daily.   atorvastatin (LIPITOR) 40 MG tablet Take 40 mg by mouth daily.   calcium carbonate (OS-CAL) 600 MG TABS tablet Take 600 mg by mouth daily with breakfast.   carbidopa-levodopa (SINEMET CR) 50-200 MG tablet Take 1 tablet by mouth 4 (four) times daily.   carbidopa-levodopa (SINEMET IR) 25-100 MG tablet Take 1 tablet by mouth 4 (four) times daily.   Cholecalciferol (VITAMIN D3) 50 MCG (2000 UT) TABS Take 1 tablet by mouth daily.   cholestyramine (QUESTRAN) 4 g packet TAKE 1 PACKET BY MOUTH TWICE DAILY WITH A MEAL   diclofenac Sodium (VOLTAREN) 1 % GEL Apply topically 4 (four) times daily as needed (Apply to the affected right ankle).   donepezil (ARICEPT) 10 MG tablet TAKE ONE TABLET AT BEDTIME   ipratropium (ATROVENT) 0.03 % nasal spray Place 2 sprays into both nostrils 3 (three) times daily.   lidocaine (XYLOCAINE) 2 % solution Use as directed 15 mLs in the mouth or throat as needed for mouth  pain.   midodrine (PROAMATINE) 5 MG tablet Take 1 tablet (5 mg total) by mouth 2 (two) times daily with a meal.   multivitamin-iron-minerals-folic acid (CENTRUM) chewable tablet Chew 1 tablet by mouth daily.   nitroGLYCERIN (NITROSTAT) 0.4 MG SL tablet Place 1 tablet (0.4 mg total) under the tongue every 5 (five) minutes as needed for chest pain. PT OVERDUE FOR OV PLEASE CALL FOR APPT   pantoprazole (PROTONIX) 40 MG tablet Take 40 mg by mouth daily.   PARoxetine (PAXIL) 20 MG tablet Take 20 mg by mouth 2 (two) times daily.   potassium chloride SA (K-DUR,KLOR-CON) 20 MEQ tablet Take 40 mEq by mouth daily.   Probiotic Product (ALIGN) 4 MG CAPS Take 1 capsule by mouth daily.    sodium chloride (OCEAN)  0.65 % SOLN nasal spray Place 2 sprays into both nostrils daily as needed for congestion.    torsemide (DEMADEX) 20 MG tablet Take 1 tablet (20 mg total) by mouth daily.   vitamin B-12 (CYANOCOBALAMIN) 1000 MCG tablet Take 1,000 mcg by mouth daily.   No facility-administered encounter medications on file as of 12/19/2020.    Review of Systems  Constitutional:  Negative for activity change, appetite change, fatigue and fever.  HENT:  Negative for congestion, hearing loss and trouble swallowing.   Eyes:  Negative for visual disturbance.  Respiratory:  Negative for cough, shortness of breath and wheezing.   Cardiovascular:  Positive for leg swelling. Negative for chest pain.  Gastrointestinal:  Positive for diarrhea. Negative for abdominal distention, abdominal pain, blood in stool, constipation and nausea.  Genitourinary:  Negative for dysuria, frequency and hematuria.  Musculoskeletal:  Positive for gait problem. Negative for arthralgias and myalgias.  Skin:        recent skin biopsy to left arm  Neurological:  Positive for tremors and weakness. Negative for dizziness, light-headedness and headaches.  Psychiatric/Behavioral:  Positive for confusion. Negative for dysphoric mood and sleep  disturbance. The patient is nervous/anxious.    Immunization History  Administered Date(s) Administered   Influenza, High Dose Seasonal PF 01/21/2019   Influenza-Unspecified 12/06/2014, 11/27/2017, 02/01/2020   Moderna SARS-COV2 Booster Vaccination 11/28/2020   Moderna Sars-Covid-2 Vaccination 04/13/2019, 05/11/2019, 02/18/2020   Pneumococcal Conjugate-13 05/21/2018   Pneumococcal Polysaccharide-23 05/21/2005   Tdap 06/03/2006, 10/22/2018   Zoster Recombinat (Shingrix) 10/22/2018, 12/24/2018   Pertinent  Health Maintenance Due  Topic Date Due   INFLUENZA VACCINE  11/06/2020   URINE MICROALBUMIN  01/06/2021 (Originally 01/07/1939)   PNA vac Low Risk Adult  Completed   Fall Risk  12/07/2020 11/11/2019 07/20/2019 10/22/2018 05/12/2018  Falls in the past year? 1 1 - 1 1  Number falls in past yr: 1 0 - 1 1  Injury with Fall? 1 1 - 1 0  Risk for fall due to : Impaired balance/gait;History of fall(s) History of fall(s);Impaired balance/gait History of fall(s);Impaired balance/gait;Impaired mobility;Mental status change;Medication side effect History of fall(s);Impaired mobility Impaired mobility;History of fall(s);Impaired balance/gait;Mental status change;Medication side effect  Follow up Falls evaluation completed;Follow up appointment Falls evaluation completed Education provided;Falls prevention discussed;Falls evaluation completed Falls evaluation completed;Falls prevention discussed Falls evaluation completed;Falls prevention discussed;Education provided  Comment - - at [facility] - -   Functional Status Survey:    Vitals:   12/19/20 1004  BP: (!) 146/80  Pulse: 94  Resp: 18  Temp: (!) 97.5 F (36.4 C)  SpO2: 92%  Weight: 171 lb (77.6 kg)  Height: 5' 9"  (1.753 m)   Body mass index is 25.25 kg/m. Physical Exam Vitals reviewed.  Constitutional:      General: He is not in acute distress. HENT:     Head: Normocephalic.     Right Ear: There is no impacted cerumen.     Left Ear:  There is no impacted cerumen.     Nose: Nose normal.     Mouth/Throat:     Mouth: Mucous membranes are moist.  Eyes:     General:        Right eye: No discharge.        Left eye: No discharge.  Neck:     Vascular: No carotid bruit.  Cardiovascular:     Rate and Rhythm: Normal rate and regular rhythm.     Pulses: Normal pulses.     Heart sounds:  Normal heart sounds. No murmur heard. Pulmonary:     Effort: Pulmonary effort is normal. No respiratory distress.     Breath sounds: Normal breath sounds. No wheezing.  Abdominal:     General: Bowel sounds are normal. There is no distension.     Palpations: Abdomen is soft.     Tenderness: There is no abdominal tenderness.  Musculoskeletal:     Cervical back: Normal range of motion.     Right lower leg: Edema present.     Left lower leg: Edema present.     Comments: Non-pitting  Lymphadenopathy:     Cervical: No cervical adenopathy.  Skin:    General: Skin is warm and dry.     Capillary Refill: Capillary refill takes less than 2 seconds.  Neurological:     General: No focal deficit present.     Mental Status: He is alert. Mental status is at baseline.     Sensory: Sensation is intact.     Motor: Weakness present.     Coordination: Coordination is intact.     Gait: Gait abnormal.     Comments: PWC, right resting hand tremor noted, no ridigity  Psychiatric:        Mood and Affect: Mood normal.        Behavior: Behavior normal.        Cognition and Memory: Memory is impaired.    Labs reviewed: Recent Labs    07/13/20 0000  NA 139  K 3.6  CL 100  CO2 24*  BUN 20  CREATININE 1.1  CALCIUM 9.1   Recent Labs    07/13/20 0000  AST 33  ALT 8*  ALKPHOS 83  ALBUMIN 4.5   Recent Labs    07/13/20 0000  WBC 7.4  HGB 12.5*  HCT 37*  PLT 232   Lab Results  Component Value Date   TSH 3.60 10/16/2020   No results found for: HGBA1C Lab Results  Component Value Date   CHOL 125 07/13/2020   HDL 60 07/13/2020    LDLCALC 45 07/13/2020   TRIG 100 07/13/2020    Significant Diagnostic Results in last 30 days:  No results found.  Assessment/Plan 1. Chronic diastolic CHF (congestive heart failure) ([place]) - followed by cardiology - LV EF 65-70% 2016 - no recent weight fluctuations, sob or pitting edema - cont torsemide daily  2. Mixed obsessional thoughts and acts - followed by Dr. Casimiro Needle - Paxil started 08/18- behaviors improving  3. Dementia due to Parkinson's disease without behavioral disturbance ([place]) - no recent behavioral outbursts - MMSE 28/30 10/2020 - cont donepezil qhs  4. Parkinson disease ([place]) - recently seen by neuro, no changes to carbidopa - remains non ambulatory due to balance issues, uses PWC - right hand resting tremor noted - cont current carbidopa regimen  5. Gastroesophageal reflux disease without esophagitis - no recent reflux  - hgb 12.5 07/13/2020 - cont Protonix  6. Orthostatic hypotension - blood pressures controlled - cont midodrine  7. Crohn's disease of small intestine without complication ([place]) - no recent flares - occasional diarrhea - cont questran daily  8. Coronary artery disease involving native coronary artery of native heart without angina pectoris - stable with asa and statin    Family/ staff Communication: plan discussed with patient and nurse  Labs/tests ordered:  none

## 2020-12-26 DIAGNOSIS — Z85828 Personal history of other malignant neoplasm of skin: Secondary | ICD-10-CM | POA: Diagnosis not present

## 2020-12-26 DIAGNOSIS — L570 Actinic keratosis: Secondary | ICD-10-CM | POA: Diagnosis not present

## 2020-12-26 DIAGNOSIS — D485 Neoplasm of uncertain behavior of skin: Secondary | ICD-10-CM | POA: Diagnosis not present

## 2020-12-26 DIAGNOSIS — L814 Other melanin hyperpigmentation: Secondary | ICD-10-CM | POA: Diagnosis not present

## 2020-12-26 DIAGNOSIS — L821 Other seborrheic keratosis: Secondary | ICD-10-CM | POA: Diagnosis not present

## 2020-12-26 DIAGNOSIS — Z85820 Personal history of malignant melanoma of skin: Secondary | ICD-10-CM | POA: Diagnosis not present

## 2021-01-08 ENCOUNTER — Non-Acute Institutional Stay (SKILLED_NURSING_FACILITY): Payer: PPO | Admitting: Internal Medicine

## 2021-01-08 ENCOUNTER — Encounter: Payer: Self-pay | Admitting: Internal Medicine

## 2021-01-08 DIAGNOSIS — F0280 Dementia in other diseases classified elsewhere without behavioral disturbance: Secondary | ICD-10-CM

## 2021-01-08 DIAGNOSIS — K5000 Crohn's disease of small intestine without complications: Secondary | ICD-10-CM

## 2021-01-08 DIAGNOSIS — K219 Gastro-esophageal reflux disease without esophagitis: Secondary | ICD-10-CM

## 2021-01-08 DIAGNOSIS — I5032 Chronic diastolic (congestive) heart failure: Secondary | ICD-10-CM | POA: Diagnosis not present

## 2021-01-08 DIAGNOSIS — G20 Parkinson's disease: Secondary | ICD-10-CM

## 2021-01-08 DIAGNOSIS — I2510 Atherosclerotic heart disease of native coronary artery without angina pectoris: Secondary | ICD-10-CM | POA: Diagnosis not present

## 2021-01-08 DIAGNOSIS — E782 Mixed hyperlipidemia: Secondary | ICD-10-CM

## 2021-01-08 DIAGNOSIS — F422 Mixed obsessional thoughts and acts: Secondary | ICD-10-CM | POA: Diagnosis not present

## 2021-01-08 DIAGNOSIS — I951 Orthostatic hypotension: Secondary | ICD-10-CM | POA: Diagnosis not present

## 2021-01-08 DIAGNOSIS — G20A1 Parkinson's disease without dyskinesia, without mention of fluctuations: Secondary | ICD-10-CM

## 2021-01-08 NOTE — Progress Notes (Signed)
Location:   [facility] Room Number: 133 Place of Service:  SNF (31) Provider:  ,  MD  ,  L, MD  Patient Care Team: ,  L, MD as PCP - General (Internal Medicine) Jordan, [name], MD as PCP - Cardiology (Cardiology) Jordan, [name], MD as Consulting Physician (Cardiology) Jacobs, Daniel P, MD as Attending Physician (Gastroenterology) Willis, Charles K, MD as Consulting Physician (Neurology) Community, [facility] (Skilled Nursing Facility) Wert, Christina, NP as Nurse Practitioner (Nurse Practitioner)  Extended Emergency Contact Information Primary Emergency Contact: Salomone,Nancy C Address: [address]          [place], [place] [address] [place] Home Phone: [phone] Mobile Phone: [phone] Relation: Spouse  Code Status:  DNR Goals of care: Advanced Directive information Advanced Directives 01/08/2021  Does Patient Have a Medical Advance Directive? Yes  Type of Advance Directive Healthcare Power of Attorney;Living will;Out of facility DNR (pink MOST or yellow form)  Does patient want to make changes to medical advance directive? No - Patient declined  Copy of Healthcare Power of Attorney in Chart? Yes - validated most recent copy scanned in chart (See row information)  Pre-existing out of facility DNR order (yellow form or pink MOST form) Yellow form placed in chart (order not valid for inpatient use)     Chief Complaint  Patient presents with   Medical Management of Chronic Issues   Quality Metric Gaps    Urine microalbumin and flu shot    HPI:  Pt is a 87 y.o. male seen today for medical management of chronic diseases.    Patient has h/o Parkinson disease on Sinemet Follows with Dr Willis H/o CAD Follows with Dr Jordan Chronic CHF with LE edema HTN, HLD Also h/o Crohn's Disease Don't see any recent follow up with GI Orthostatic hypotension Also has h/o  OCD follows with Facility Psychiatry    Met with his wife today Ddi not have any acute issues It is hard to get any history from him. He is always in the bathroom or going to acitivity She does not think anything has changed she is happy with his care Weight is stable His weight is stable Continues to have OCD symptoms Washing hands taking frequent bath. And changing clothes   Past Medical History:  Diagnosis Date   Cellulitis    Chronic diastolic CHF (congestive heart failure) (HCC)    a. EF initially 35-40% after MI 1/09; b. echo 7/08: EF 60%;  c. 11/2014 Echo: EF 65-70%, Gr 1 DD, mild MR.   Coronary artery disease    a. s/p anterior STEMI 04/2007 with BMS-> LAD;  b. Cath 12/2011 patent stent;  c. low risk nuc 04/2014.   Crohn's disease (HCC)    Diaphragmatic hernia without mention of obstruction or gangrene    Diverticulosis of colon (without mention of hemorrhage)    Fatty liver    a. on ultrasound of 10/2009   Flatulence, eructation, and gas pain    Fungal infection    Gait disorder    GERD (gastroesophageal reflux disease)    HOH (hard of hearing)    Hearing aids   Hyperlipidemia    Hypertension    Ischemic cardiomyopathy    a. EF initially 35-40% after MI 1/09; b. echo 7/08: EF 60%;  c. 11/2014 Echo: EF 65-70%, Gr 1 DD, mild MR.   Melanoma (HCC)    Memory disorder    Orthostatic hypotension      Osteoporosis    Other chronic nonalcoholic liver disease    Other esophagitis    Parkinson's disease (HCC)    RBBB 09/02/2013   Regional enteritis of small intestine (HCC)    Renal calculi    Restless leg syndrome 03/22/2015   Ulcerative (chronic) ileocolitis (HCC)    Urinary incontinence    Past Surgical History:  Procedure Laterality Date   APPENDECTOMY     BACK SURGERY     L3, L4, L5   CARDIAC CATHETERIZATION  09/25/06   EF 35-40% but more recently 60%   CATARACT EXTRACTION     Bilateral   CHOLECYSTECTOMY     Coronary artery stent placement     Melanoma resection      Partial bowel resection     TONSILLECTOMY      No Known Allergies  Allergies as of 01/08/2021   No Known Allergies      Medication List        Accurate as of January 08, 2021 11:16 AM. If you have any questions, ask your nurse or doctor.          acetaminophen 325 MG tablet Commonly known as: TYLENOL Take 650 mg by mouth at bedtime. And q 6 hrs prn   Align 4 MG Caps Take 1 capsule by mouth daily.   aspirin EC 81 MG tablet Take 81 mg by mouth daily.   atorvastatin 40 MG tablet Commonly known as: LIPITOR Take 40 mg by mouth daily.   calcium carbonate 600 MG Tabs tablet Commonly known as: OS-CAL Take 600 mg by mouth daily with breakfast.   carbidopa-levodopa 25-100 MG tablet Commonly known as: SINEMET IR Take 1 tablet by mouth 4 (four) times daily.   carbidopa-levodopa 50-200 MG tablet Commonly known as: SINEMET CR Take 1 tablet by mouth 4 (four) times daily.   cholestyramine 4 g packet Commonly known as: QUESTRAN TAKE 1 PACKET BY MOUTH TWICE DAILY WITH A MEAL   diclofenac Sodium 1 % Gel Commonly known as: VOLTAREN Apply topically 4 (four) times daily as needed (Apply to the affected right ankle).   donepezil 10 MG tablet Commonly known as: ARICEPT TAKE ONE TABLET AT BEDTIME   ipratropium 0.03 % nasal spray Commonly known as: ATROVENT Place 2 sprays into both nostrils 3 (three) times daily.   lidocaine 2 % solution Commonly known as: XYLOCAINE Use as directed 15 mLs in the mouth or throat as needed for mouth pain.   midodrine 5 MG tablet Commonly known as: PROAMATINE Take 1 tablet (5 mg total) by mouth 2 (two) times daily with a meal.   multivitamin-iron-minerals-folic acid chewable tablet Chew 1 tablet by mouth daily.   nitroGLYCERIN 0.4 MG SL tablet Commonly known as: NITROSTAT Place 1 tablet (0.4 mg total) under the tongue every 5 (five) minutes as needed for chest pain. PT OVERDUE FOR OV PLEASE CALL FOR APPT   pantoprazole 40 MG  tablet Commonly known as: PROTONIX Take 40 mg by mouth daily.   PARoxetine 20 MG tablet Commonly known as: PAXIL Take 20 mg by mouth 2 (two) times daily.   potassium chloride SA 20 MEQ tablet Commonly known as: KLOR-CON Take 40 mEq by mouth daily.   sodium chloride 0.65 % Soln nasal spray Commonly known as: OCEAN Place 2 sprays into both nostrils daily as needed for congestion.   torsemide 20 MG tablet Commonly known as: DEMADEX Take 1 tablet (20 mg total) by mouth daily.   vitamin B-12 1000 MCG tablet Commonly   known as: CYANOCOBALAMIN Take 1,000 mcg by mouth daily.   Vitamin D3 50 MCG (2000 UT) Tabs Take 1 tablet by mouth daily.        Review of Systems  Constitutional: Negative.   HENT: Negative.    Respiratory: Negative.    Cardiovascular: Negative.   Gastrointestinal: Negative.   Genitourinary: Negative.   Musculoskeletal:  Positive for gait problem.  Skin: Negative.   Psychiatric/Behavioral:  Positive for behavioral problems and confusion. The patient is nervous/anxious and is hyperactive.    Immunization History  Administered Date(s) Administered   Influenza, High Dose Seasonal PF 01/21/2019   Influenza-Unspecified 12/06/2014, 11/27/2017, 02/01/2020   Moderna SARS-COV2 Booster Vaccination 11/28/2020   Moderna Sars-Covid-2 Vaccination 04/13/2019, 05/11/2019, 02/18/2020   Pneumococcal Conjugate-13 05/21/2018   Pneumococcal Polysaccharide-23 05/21/2005   Tdap 06/03/2006, 10/22/2018   Zoster Recombinat (Shingrix) 10/22/2018, 12/24/2018   Pertinent  Health Maintenance Due  Topic Date Due   URINE MICROALBUMIN  Never done   INFLUENZA VACCINE  11/06/2020   Fall Risk  12/07/2020 11/11/2019 07/20/2019 10/22/2018 05/12/2018  Falls in the past year? 1 1 - 1 1  Number falls in past yr: 1 0 - 1 1  Injury with Fall? 1 1 - 1 0  Risk for fall due to : Impaired balance/gait;History of fall(s) History of fall(s);Impaired balance/gait History of fall(s);Impaired  balance/gait;Impaired mobility;Mental status change;Medication side effect History of fall(s);Impaired mobility Impaired mobility;History of fall(s);Impaired balance/gait;Mental status change;Medication side effect  Follow up Falls evaluation completed;Follow up appointment Falls evaluation completed Education provided;Falls prevention discussed;Falls evaluation completed Falls evaluation completed;Falls prevention discussed Falls evaluation completed;Falls prevention discussed;Education provided  Comment - - at [facility] - -   Functional Status Survey:    Vitals:   01/08/21 1108  BP: 120/70  Pulse: 98  Resp: 18  Temp: (!) 97.2 F (36.2 C)  SpO2: 98%  Weight: 174 lb (78.9 kg)  Height: 5' 9.8" (1.773 m)   Body mass index is 25.11 kg/m. Physical Exam Vitals reviewed.  Constitutional:      Appearance: Normal appearance.  HENT:     Head: Normocephalic.     Nose: Nose normal.     Mouth/Throat:     Mouth: Mucous membranes are moist.     Pharynx: Oropharynx is clear.  Eyes:     Pupils: Pupils are equal, round, and reactive to light.  Cardiovascular:     Rate and Rhythm: Normal rate and regular rhythm.     Pulses: Normal pulses.  Pulmonary:     Effort: Pulmonary effort is normal.     Breath sounds: Normal breath sounds.  Abdominal:     General: Abdomen is flat. Bowel sounds are normal.     Palpations: Abdomen is soft.  Musculoskeletal:        General: No swelling.     Cervical back: Neck supple.  Skin:    General: Skin is warm.  Neurological:     General: No focal deficit present.     Mental Status: He is alert.     Comments: Was moving around in his wheelchair and trying to transfer himself ot Power chair which he uses for long distance  Psychiatric:        Mood and Affect: Mood normal.        Thought Content: Thought content normal.    Labs reviewed: Recent Labs    07/13/20 0000  NA 139  K 3.6  CL 100  CO2 24*  BUN 20  CREATININE 1.1  CALCIUM 9.1      Recent Labs    07/13/20 0000  AST 33  ALT 8*  ALKPHOS 83  ALBUMIN 4.5   Recent Labs    07/13/20 0000  WBC 7.4  HGB 12.5*  HCT 37*  PLT 232   Lab Results  Component Value Date   TSH 3.60 10/16/2020   No results found for: HGBA1C Lab Results  Component Value Date   CHOL 125 07/13/2020   HDL 60 07/13/2020   LDLCALC 45 07/13/2020   TRIG 100 07/13/2020    Significant Diagnostic Results in last 30 days:  No results found.  Assessment/Plan Chronic diastolic CHF (congestive heart failure) (HCC) On Low dose of Demadex Labs done in 4/22 stable  Mixed obsessional thoughts and acts On Paxil Follows with Dr Casimiro Needle  Dementia due to Parkinson's disease without behavioral disturbance ([place]) Supportive care. MMSE 28/30 Also on Aricept  Parkinson disease ([place]) On Sinemet per Neurology  Gastroesophageal reflux disease without esophagitis On Protonix  Orthostatic hypotension Doing well  Midodrine Crohn's disease of small intestine without complication (HCC) On Questran   CAD On aspirin and statin Mixed hyperlipidemia On statin LDL 45 in 4/22   Family/ staff Communication:   Labs/tests ordered:

## 2021-01-21 ENCOUNTER — Telehealth: Payer: Self-pay | Admitting: Family

## 2021-01-21 NOTE — Telephone Encounter (Signed)
[facility] called states accidentally administered wrong medication to patient belong to another resident.Received Lunesta 3 mg,Gas - X and Aricept 5 mg tablet.patient already received own Aricept 10 mg tablet earlier today.VSS. Nurse advised to monitor Neuro checks and Vital signs every 4 hours.Notify provider for any changes.please follow up.

## 2021-01-25 DIAGNOSIS — F605 Obsessive-compulsive personality disorder: Secondary | ICD-10-CM | POA: Diagnosis not present

## 2021-02-08 ENCOUNTER — Non-Acute Institutional Stay (SKILLED_NURSING_FACILITY): Payer: PPO | Admitting: Adult Health

## 2021-02-08 ENCOUNTER — Encounter: Payer: Self-pay | Admitting: Adult Health

## 2021-02-08 DIAGNOSIS — I5032 Chronic diastolic (congestive) heart failure: Secondary | ICD-10-CM | POA: Diagnosis not present

## 2021-02-08 DIAGNOSIS — F422 Mixed obsessional thoughts and acts: Secondary | ICD-10-CM

## 2021-02-08 DIAGNOSIS — G20 Parkinson's disease: Secondary | ICD-10-CM

## 2021-02-08 DIAGNOSIS — I951 Orthostatic hypotension: Secondary | ICD-10-CM

## 2021-02-08 DIAGNOSIS — K5000 Crohn's disease of small intestine without complications: Secondary | ICD-10-CM | POA: Diagnosis not present

## 2021-02-08 DIAGNOSIS — F0280 Dementia in other diseases classified elsewhere without behavioral disturbance: Secondary | ICD-10-CM

## 2021-02-08 NOTE — Progress Notes (Signed)
Location:  [facility] Room Number: 539-J Place of Service:  SNF [phone]) Provider:  Royal Hawthorn, NP   Patient Care Team: [name], MD as PCP - General (Internal Medicine) [name], [name], MD as PCP - Cardiology (Cardiology) [name], [name], MD as Consulting Physician (Cardiology) [name], MD as Attending Physician (Gastroenterology) [name], MD as Consulting Physician (Neurology) Community, [facility] ([place]) Royal Hawthorn, NP as Nurse Practitioner (Nurse Practitioner)  Extended Emergency Contact Information Primary Emergency Contact: [name] Address: [address] [place]          [place],  [address] [name] of [place] Phone: [phone] Mobile Phone: [phone] Relation: Spouse  Code Status:  DNR Goals of care: Advanced Directive information Advanced Directives 02/08/2021  Does Patient Have a Medical Advance Directive? Yes  Type of Paramedic of [place];Living will;Out of facility DNR (pink MOST or yellow form)  Does patient want to make changes to medical advance directive? No - Patient declined  Copy of [place] in Chart? Yes - validated most recent copy scanned in chart (See row information)  Pre-existing out of facility DNR order (yellow form or pink MOST form) Yellow form placed in chart (order not valid for inpatient use)     Chief Complaint  Patient presents with   Medical Management of Chronic Issues    Routine visit and discuss need for Urine MALB     HPI:  Pt is a 87 y.o. male seen today for medical management of chronic diseases.    No acute concerns.   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  Nurses report he has been fixated on this plan and is not easily redirected.  Continues with obsessive thoughts and  cleaning practices.  Continues on paxil for this reason. Mild benefit noted.   BPs in matrix reviewed in the past month.   Blood Pressure: 136 / 97 mmHg  Blood Pressure: 138 / 80 mmHg  Blood Pressure: 128 / 75 mmHg  Blood Pressure: 131 / 80 mmHg  Blood Pressure: 150 / 80 mmHg  CHF on torsemide daily, mild chronic edema in BLE. No sob, pnd, or weight gain  Uses motorized chair for long distances. Stays active in the community. Has short term memory loss.   Crohn's disease: no reports of diarrhea or abd pain. Past Medical History:  Diagnosis Date   Cellulitis    Chronic diastolic CHF (congestive heart failure) ([place])    a. EF initially 35-40% after MI 1/09; b. echo 7/08: EF 60%;  c. 11/2014 Echo: EF 65-70%, Gr 1 DD, mild MR.   Coronary artery disease    a. s/p anterior STEMI 04/2007 with BMS-> LAD;  b. Cath 12/2011 patent stent;  c. low risk nuc 04/2014.   Crohn's disease ([place])    Diaphragmatic hernia without mention of obstruction or gangrene    Diverticulosis of colon (without mention of hemorrhage)    Fatty liver    a. on ultrasound of 10/2009   Flatulence, eructation, and gas pain    Fungal infection    Gait disorder    GERD (gastroesophageal reflux disease)    HOH (hard of hearing)    Hearing aids   Hyperlipidemia    Hypertension    Ischemic cardiomyopathy    a. EF initially 35-40% after MI 1/09;  b. echo 7/08: EF 60%;  c. 11/2014 Echo: EF 65-70%, Gr 1 DD, mild MR.   Melanoma ([place])    Memory disorder    Orthostatic hypotension    Osteoporosis    Other chronic nonalcoholic liver disease    Other esophagitis    Parkinson's disease ([place])    RBBB 09/02/2013   Regional enteritis of small intestine (HCC)    Renal calculi    Restless leg syndrome 03/22/2015   Ulcerative (chronic) ileocolitis (HCC)    Urinary incontinence    Past Surgical History:  Procedure Laterality Date   APPENDECTOMY     BACK SURGERY     L3, L4, L5   CARDIAC CATHETERIZATION  09/25/06   EF 35-40% but  more recently 60%   CATARACT EXTRACTION     Bilateral   CHOLECYSTECTOMY     Coronary artery stent placement     Melanoma resection     Partial bowel resection     TONSILLECTOMY      No Known Allergies  Outpatient Encounter Medications as of 02/08/2021  Medication Sig   acetaminophen (TYLENOL) 325 MG tablet Take 650 mg by mouth at bedtime. And q 6 hrs prn   aspirin EC 81 MG tablet Take 81 mg by mouth daily.   atorvastatin (LIPITOR) 40 MG tablet Take 40 mg by mouth daily.   calcium carbonate (OS-CAL) 600 MG TABS tablet Take 600 mg by mouth daily with breakfast.   carbidopa-levodopa (SINEMET CR) 50-200 MG tablet Take 1 tablet by mouth 4 (four) times daily.   carbidopa-levodopa (SINEMET IR) 25-100 MG tablet Take 1 tablet by mouth 4 (four) times daily.   Cholecalciferol (VITAMIN D3) 50 MCG (2000 UT) TABS Take 1 tablet by mouth daily.   cholestyramine (QUESTRAN) 4 g packet TAKE 1 PACKET BY MOUTH TWICE DAILY WITH A MEAL   diclofenac Sodium (VOLTAREN) 1 % GEL Apply topically 4 (four) times daily as needed (Apply to the affected right ankle).   donepezil (ARICEPT) 10 MG tablet TAKE ONE TABLET AT BEDTIME   ipratropium (ATROVENT) 0.03 % nasal spray Place 2 sprays into both nostrils 3 (three) times daily.   lidocaine (XYLOCAINE) 2 % solution Use as directed 15 mLs in the mouth or throat as needed for mouth pain.   midodrine (PROAMATINE) 5 MG tablet Take 1 tablet (5 mg total) by mouth 2 (two) times daily with a meal.   multivitamin-iron-minerals-folic acid (CENTRUM) chewable tablet Chew 1 tablet by mouth daily.   nitroGLYCERIN (NITROSTAT) 0.4 MG SL tablet Place 1 tablet (0.4 mg total) under the tongue every 5 (five) minutes as needed for chest pain. PT OVERDUE FOR OV PLEASE CALL FOR APPT   pantoprazole (PROTONIX) 40 MG tablet Take 40 mg by mouth daily.   PARoxetine (PAXIL) 20 MG tablet Take 20 mg by mouth 2 (two) times daily.   potassium chloride SA (K-DUR,KLOR-CON) 20 MEQ tablet Take 40 mEq by  mouth daily.   Probiotic Product (ALIGN) 4 MG CAPS Take 1 capsule by mouth daily.    sodium chloride (OCEAN) 0.65 % SOLN nasal spray Place 2 sprays into both nostrils daily as needed for congestion.    torsemide (DEMADEX) 20 MG tablet Take 1 tablet (20 mg total) by mouth daily.   vitamin B-12 (CYANOCOBALAMIN) 1000 MCG tablet Take 1,000 mcg by mouth daily.   [DISCONTINUED] blood glucose meter kit and supplies 1 each by Other route as directed. BIONIME, Dispense based on patient and insurance preference. Use up to four times daily  as directed. (FOR ICD-10 E10.9, E11.9).   No facility-administered encounter medications on file as of 02/08/2021.    Review of Systems  Constitutional:  Negative for activity change, appetite change, chills, diaphoresis, fatigue, fever and unexpected weight change.  Respiratory:  Negative for cough, shortness of breath, wheezing and stridor.   Cardiovascular:  Positive for leg swelling. Negative for chest pain and palpitations.  Gastrointestinal:  Negative for abdominal distention, abdominal pain, constipation and diarrhea.  Genitourinary:  Negative for difficulty urinating and dysuria.  Musculoskeletal:  Positive for gait problem. Negative for arthralgias, back pain, joint swelling and myalgias.  Neurological:  Negative for dizziness, seizures, syncope, facial asymmetry, speech difficulty, weakness and headaches.  Hematological:  Negative for adenopathy. Does not bruise/bleed easily.  Psychiatric/Behavioral:  Positive for confusion. Negative for agitation and behavioral problems.        Excessive cleaning, fixations   Immunization History  Administered Date(s) Administered   Influenza, High Dose Seasonal PF 01/21/2019, 01/10/2021   Influenza-Unspecified 12/06/2014, 11/27/2017, 02/01/2020   Moderna Covid-19 Vaccine Bivalent Booster 42yr & up 01/17/2021   Moderna SARS-COV2 Booster Vaccination 11/28/2020   Moderna Sars-Covid-2 Vaccination 04/13/2019, 05/11/2019,  02/18/2020   Pneumococcal Conjugate-13 05/21/2018   Pneumococcal Polysaccharide-23 05/21/2005   Tdap 06/03/2006, 10/22/2018   Zoster Recombinat (Shingrix) 10/22/2018, 12/24/2018   Pertinent  Health Maintenance Due  Topic Date Due   URINE MICROALBUMIN  03/11/2021 (Originally 01/07/1939)   INFLUENZA VACCINE  Completed   Fall Risk 05/12/2018 10/22/2018 07/20/2019 11/11/2019 12/07/2020  Falls in the past year? 1 1 - 1 1  Was there an injury with Fall? 0 1 - 1 1  Fall Risk Category Calculator 2 3 - 2 3  Fall Risk Category Moderate High - Moderate High  Patient Fall Risk Level Moderate fall risk - - High fall risk High fall risk  Patient at Risk for Falls Due to Impaired mobility;History of fall(s);Impaired balance/gait;Mental status change;Medication side effect History of fall(s);Impaired mobility History of fall(s);Impaired balance/gait;Impaired mobility;Mental status change;Medication side effect History of fall(s);Impaired balance/gait Impaired balance/gait;History of fall(s)  Fall risk Follow up Falls evaluation completed;Falls prevention discussed;Education provided Falls evaluation completed;Falls prevention discussed Education provided;Falls prevention discussed;Falls evaluation completed Falls evaluation completed Falls evaluation completed;Follow up appointment  Fall risk Follow up - - at [facility]    Vitals:   02/09/21 1645  Weight: 171 lb 9.6 oz (77.8 kg)   Body mass index is 24.76 kg/m. Wt Readings from Last 3 Encounters:  02/09/21 171 lb 9.6 oz (77.8 kg)  01/08/21 174 lb (78.9 kg)  12/19/20 171 lb (77.6 kg)    Physical Exam Vitals and nursing note reviewed.  Constitutional:      General: He is not in acute distress.    Appearance: He is not diaphoretic.  HENT:     Head: Normocephalic and atraumatic.     Mouth/Throat:     Mouth: Mucous membranes are moist.     Pharynx: Oropharynx is clear.  Eyes:     Conjunctiva/sclera: Conjunctivae  normal.     Pupils: Pupils are equal, round, and reactive to light.  Neck:     Thyroid: No thyromegaly.     Vascular: No JVD.     Trachea: No tracheal deviation.  Cardiovascular:     Rate and Rhythm: Normal rate and regular rhythm.     Heart sounds: No murmur heard. Pulmonary:     Effort: Pulmonary effort is normal. No respiratory distress.     Breath  sounds: Normal breath sounds. No wheezing.  Abdominal:     General: Bowel sounds are normal. There is no distension.     Palpations: Abdomen is soft.     Tenderness: There is no abdominal tenderness.     Comments: rotund  Musculoskeletal:     Cervical back: Normal range of motion and neck supple.     Comments: BLE +1  Lymphadenopathy:     Cervical: No cervical adenopathy.  Skin:    General: Skin is warm and dry.  Neurological:     Mental Status: He is alert and oriented to person, place, and time.     Cranial Nerves: No cranial nerve deficit.  Psychiatric:        Mood and Affect: Mood normal.    Labs reviewed: Recent Labs    07/13/20 0000  NA 139  K 3.6  CL 100  CO2 24*  BUN 20  CREATININE 1.1  CALCIUM 9.1   Recent Labs    07/13/20 0000  AST 33  ALT 8*  ALKPHOS 83  ALBUMIN 4.5   Recent Labs    07/13/20 0000  WBC 7.4  HGB 12.5*  HCT 37*  PLT 232   Lab Results  Component Value Date   TSH 3.60 10/16/2020   No results found for: HGBA1C Lab Results  Component Value Date   CHOL 125 07/13/2020   HDL 60 07/13/2020   LDLCALC 45 07/13/2020   TRIG 100 07/13/2020    Significant Diagnostic Results in last 30 days:  No results found.  Assessment/Plan  1. Crohn's disease of small intestine without complication ([place]) Has done well for quite some time on Align and Questran  2. Chronic diastolic CHF (congestive heart failure) ([place]) Remains compensated on torsemide and is followed by cardiology   3. Mixed obsessional thoughts and acts Continue Paxil 20 mg bid Followed by psych  4. Orthostatic  hypotension No current issues Continue midodrine  5. Dementia due to Parkinson's disease without behavioral disturbance ([place]) Short term memory loss, remains oriented. Continues on Aricept. Main issues or OCD behaviors.    Family/ staff Communication: nurse  Labs/tests ordered:  BMP

## 2021-02-09 ENCOUNTER — Encounter: Payer: Self-pay | Admitting: Adult Health

## 2021-02-12 DIAGNOSIS — I5030 Unspecified diastolic (congestive) heart failure: Secondary | ICD-10-CM | POA: Diagnosis not present

## 2021-02-12 LAB — COMPREHENSIVE METABOLIC PANEL
Calcium: 8.9 (ref 8.7–10.7)
Calcium: 8.9 (ref 8.7–10.7)

## 2021-02-12 LAB — BASIC METABOLIC PANEL
BUN: 19 (ref 4–21)
BUN: 19 (ref 4–21)
CO2: 27 — AB (ref 13–22)
CO2: 27 — AB (ref 13–22)
Chloride: 101 (ref 99–108)
Chloride: 101 (ref 99–108)
Creatinine: 1.1 (ref 0.6–1.3)
Creatinine: 1.1 (ref 0.6–1.3)
Glucose: 106
Glucose: 106
Potassium: 3.7 (ref 3.4–5.3)
Potassium: 3.7 mEq/L (ref 3.5–5.1)
Sodium: 142 (ref 137–147)
Sodium: 142 (ref 137–147)

## 2021-02-19 DIAGNOSIS — G20 Parkinson's disease: Secondary | ICD-10-CM | POA: Diagnosis not present

## 2021-02-19 DIAGNOSIS — R296 Repeated falls: Secondary | ICD-10-CM | POA: Diagnosis not present

## 2021-02-19 DIAGNOSIS — R41842 Visuospatial deficit: Secondary | ICD-10-CM | POA: Diagnosis not present

## 2021-02-19 DIAGNOSIS — M6389 Disorders of muscle in diseases classified elsewhere, multiple sites: Secondary | ICD-10-CM | POA: Diagnosis not present

## 2021-02-19 DIAGNOSIS — F428 Other obsessive-compulsive disorder: Secondary | ICD-10-CM | POA: Diagnosis not present

## 2021-02-19 DIAGNOSIS — R2689 Other abnormalities of gait and mobility: Secondary | ICD-10-CM | POA: Diagnosis not present

## 2021-02-19 DIAGNOSIS — R293 Abnormal posture: Secondary | ICD-10-CM | POA: Diagnosis not present

## 2021-02-26 DIAGNOSIS — G20 Parkinson's disease: Secondary | ICD-10-CM | POA: Diagnosis not present

## 2021-02-26 DIAGNOSIS — R296 Repeated falls: Secondary | ICD-10-CM | POA: Diagnosis not present

## 2021-02-26 DIAGNOSIS — R41842 Visuospatial deficit: Secondary | ICD-10-CM | POA: Diagnosis not present

## 2021-02-26 DIAGNOSIS — R293 Abnormal posture: Secondary | ICD-10-CM | POA: Diagnosis not present

## 2021-02-26 DIAGNOSIS — R2689 Other abnormalities of gait and mobility: Secondary | ICD-10-CM | POA: Diagnosis not present

## 2021-02-26 DIAGNOSIS — M6389 Disorders of muscle in diseases classified elsewhere, multiple sites: Secondary | ICD-10-CM | POA: Diagnosis not present

## 2021-02-26 DIAGNOSIS — F428 Other obsessive-compulsive disorder: Secondary | ICD-10-CM | POA: Diagnosis not present

## 2021-02-27 DIAGNOSIS — R2689 Other abnormalities of gait and mobility: Secondary | ICD-10-CM | POA: Diagnosis not present

## 2021-02-27 DIAGNOSIS — R41842 Visuospatial deficit: Secondary | ICD-10-CM | POA: Diagnosis not present

## 2021-02-27 DIAGNOSIS — R293 Abnormal posture: Secondary | ICD-10-CM | POA: Diagnosis not present

## 2021-02-27 DIAGNOSIS — F428 Other obsessive-compulsive disorder: Secondary | ICD-10-CM | POA: Diagnosis not present

## 2021-02-27 DIAGNOSIS — G20 Parkinson's disease: Secondary | ICD-10-CM | POA: Diagnosis not present

## 2021-02-27 DIAGNOSIS — R296 Repeated falls: Secondary | ICD-10-CM | POA: Diagnosis not present

## 2021-02-27 DIAGNOSIS — M6389 Disorders of muscle in diseases classified elsewhere, multiple sites: Secondary | ICD-10-CM | POA: Diagnosis not present

## 2021-03-07 DIAGNOSIS — M6389 Disorders of muscle in diseases classified elsewhere, multiple sites: Secondary | ICD-10-CM | POA: Diagnosis not present

## 2021-03-07 DIAGNOSIS — R41842 Visuospatial deficit: Secondary | ICD-10-CM | POA: Diagnosis not present

## 2021-03-07 DIAGNOSIS — R2689 Other abnormalities of gait and mobility: Secondary | ICD-10-CM | POA: Diagnosis not present

## 2021-03-07 DIAGNOSIS — F428 Other obsessive-compulsive disorder: Secondary | ICD-10-CM | POA: Diagnosis not present

## 2021-03-07 DIAGNOSIS — G20 Parkinson's disease: Secondary | ICD-10-CM | POA: Diagnosis not present

## 2021-03-07 DIAGNOSIS — R293 Abnormal posture: Secondary | ICD-10-CM | POA: Diagnosis not present

## 2021-03-07 DIAGNOSIS — R296 Repeated falls: Secondary | ICD-10-CM | POA: Diagnosis not present

## 2021-03-08 ENCOUNTER — Non-Acute Institutional Stay (SKILLED_NURSING_FACILITY): Payer: PPO | Admitting: Adult Health

## 2021-03-08 ENCOUNTER — Encounter: Payer: Self-pay | Admitting: Adult Health

## 2021-03-08 DIAGNOSIS — R296 Repeated falls: Secondary | ICD-10-CM | POA: Diagnosis not present

## 2021-03-08 DIAGNOSIS — J300 Vasomotor rhinitis: Secondary | ICD-10-CM | POA: Diagnosis not present

## 2021-03-08 DIAGNOSIS — R41842 Visuospatial deficit: Secondary | ICD-10-CM | POA: Diagnosis not present

## 2021-03-08 DIAGNOSIS — K219 Gastro-esophageal reflux disease without esophagitis: Secondary | ICD-10-CM | POA: Diagnosis not present

## 2021-03-08 DIAGNOSIS — I10 Essential (primary) hypertension: Secondary | ICD-10-CM | POA: Diagnosis not present

## 2021-03-08 DIAGNOSIS — F422 Mixed obsessional thoughts and acts: Secondary | ICD-10-CM | POA: Diagnosis not present

## 2021-03-08 DIAGNOSIS — I2510 Atherosclerotic heart disease of native coronary artery without angina pectoris: Secondary | ICD-10-CM

## 2021-03-08 DIAGNOSIS — R2689 Other abnormalities of gait and mobility: Secondary | ICD-10-CM | POA: Diagnosis not present

## 2021-03-08 DIAGNOSIS — F0280 Dementia in other diseases classified elsewhere without behavioral disturbance: Secondary | ICD-10-CM | POA: Diagnosis not present

## 2021-03-08 DIAGNOSIS — K5000 Crohn's disease of small intestine without complications: Secondary | ICD-10-CM | POA: Diagnosis not present

## 2021-03-08 DIAGNOSIS — R293 Abnormal posture: Secondary | ICD-10-CM | POA: Diagnosis not present

## 2021-03-08 DIAGNOSIS — G20 Parkinson's disease: Secondary | ICD-10-CM | POA: Diagnosis not present

## 2021-03-08 DIAGNOSIS — M6389 Disorders of muscle in diseases classified elsewhere, multiple sites: Secondary | ICD-10-CM | POA: Diagnosis not present

## 2021-03-08 DIAGNOSIS — F428 Other obsessive-compulsive disorder: Secondary | ICD-10-CM | POA: Diagnosis not present

## 2021-03-08 NOTE — Progress Notes (Signed)
Location:   [facility] Room Number: 115 Place of Service:  SNF [id number]) Provider:  Royal Hawthorn, NP  [name], MD  Patient Care Team: [name], MD as PCP - General (Internal Medicine) [name], [name], MD as PCP - Cardiology (Cardiology) [name], [name], MD as Consulting Physician (Cardiology) [name], MD as Attending Physician (Gastroenterology) [name], MD as Consulting Physician (Neurology) Community, [facility] ([place]) Royal Hawthorn, NP as Nurse Practitioner (Nurse Practitioner)  Extended Emergency Contact Information Primary Emergency Contact: [name] Address: [address] [place] [place]          [place], [place] [address] [name] of [place] Phone: [phone] Mobile Phone: [phone] Relation: Spouse  Code Status:  DNR Goals of care: Advanced Directive information Advanced Directives 03/08/2021  Does Patient Have a Medical Advance Directive? Yes  Type of Paramedic of [place];Living will;Out of facility DNR (pink MOST or yellow form)  Does patient want to make changes to medical advance directive? No - Patient declined  Copy of [place] in Chart? Yes - validated most recent copy scanned in chart (See row information)  Pre-existing out of facility DNR order (yellow form or pink MOST form) Yellow form placed in chart (order not valid for inpatient use)     Chief Complaint  Patient presents with   Medical Management of Chronic Issues    HPI:  Pt is a 87 y.o. male seen today for medical management of chronic diseases.    OCD: pt continues to have obsessive thoughts about cleaning but his nurse reports now that he is getting a whirlpool daily it has helped. Usually when I enter the room he is in his bathroom cleaning his mouth, arms, etc.  He is followed by psych and on paxil  Crohn's: no issues with diarrhea on  questran  Has grade 1 DD and takes torsemide daily. Denies any sob. No edema or weight gain Hx of CAD with stent and ischemic cardiomyopathy on asa and statin therapy Hernia and gerd: on PPI and denies any indigestion or vomiting Has memory loss and PD also incontinence which led to his skilled are admit  Past Medical History:  Diagnosis Date   Cellulitis    Chronic diastolic CHF (congestive heart failure) ([place])    a. EF initially 35-40% after MI 1/09; b. echo 7/08: EF 60%;  c. 11/2014 Echo: EF 65-70%, Gr 1 DD, mild MR.   Coronary artery disease    a. s/p anterior STEMI 04/2007 with BMS-> LAD;  b. Cath 12/2011 patent stent;  c. low risk nuc 04/2014.   Crohn's disease ([place])    Diaphragmatic hernia without mention of obstruction or gangrene    Diverticulosis of colon (without mention of hemorrhage)    Fatty liver    a. on ultrasound of 10/2009   Flatulence, eructation, and gas pain    Fungal infection    Gait disorder    GERD (gastroesophageal reflux disease)    HOH (hard of hearing)    Hearing aids   Hyperlipidemia    Hypertension    Ischemic cardiomyopathy    a. EF initially 35-40% after MI 1/09; b. echo 7/08: EF 60%;  c. 11/2014 Echo: EF 65-70%, Gr 1 DD, mild MR.   Melanoma ([place])    Memory disorder    Orthostatic hypotension    Osteoporosis    Other chronic nonalcoholic liver disease    Other esophagitis  Parkinson's disease ([place])    RBBB 09/02/2013   Regional enteritis of small intestine ([place])    Renal calculi    Restless leg syndrome 03/22/2015   Ulcerative (chronic) ileocolitis (HCC)    Urinary incontinence    Past Surgical History:  Procedure Laterality Date   APPENDECTOMY     BACK SURGERY     L3, L4, L5   CARDIAC CATHETERIZATION  09/25/06   EF 35-40% but more recently 60%   CATARACT EXTRACTION     Bilateral   CHOLECYSTECTOMY     Coronary artery stent placement     Melanoma resection     Partial bowel resection     TONSILLECTOMY      No Known  Allergies  Allergies as of 03/08/2021   No Known Allergies      Medication List        Accurate as of March 08, 2021  3:58 PM. If you have any questions, ask your nurse or doctor.          acetaminophen 325 MG tablet Commonly known as: TYLENOL Take 650 mg by mouth at bedtime. And q 6 hrs prn   Align 4 MG Caps Take 1 capsule by mouth daily.   aspirin EC 81 MG tablet Take 81 mg by mouth daily.   atorvastatin 40 MG tablet Commonly known as: LIPITOR Take 40 mg by mouth daily.   calcium carbonate 600 MG Tabs tablet Commonly known as: OS-CAL Take 600 mg by mouth daily with breakfast.   carbidopa-levodopa 25-100 MG tablet Commonly known as: SINEMET IR Take 1 tablet by mouth 4 (four) times daily.   carbidopa-levodopa 50-200 MG tablet Commonly known as: SINEMET CR Take 1 tablet by mouth 4 (four) times daily.   cholestyramine 4 g packet Commonly known as: QUESTRAN TAKE 1 PACKET BY MOUTH TWICE DAILY WITH A MEAL   diclofenac Sodium 1 % Gel Commonly known as: VOLTAREN Apply topically 4 (four) times daily as needed (Apply to the affected right ankle).   donepezil 10 MG tablet Commonly known as: ARICEPT TAKE ONE TABLET AT BEDTIME   ipratropium 0.03 % nasal spray Commonly known as: ATROVENT Place 2 sprays into both nostrils 3 (three) times daily.   lidocaine 2 % solution Commonly known as: XYLOCAINE Use as directed 15 mLs in the mouth or throat as needed for mouth pain.   midodrine 5 MG tablet Commonly known as: PROAMATINE Take 1 tablet (5 mg total) by mouth 2 (two) times daily with a meal.   multivitamin-iron-minerals-folic acid chewable tablet Chew 1 tablet by mouth daily.   nitroGLYCERIN 0.4 MG SL tablet Commonly known as: NITROSTAT Place 1 tablet (0.4 mg total) under the tongue every 5 (five) minutes as needed for chest pain. PT OVERDUE FOR OV PLEASE CALL FOR APPT   pantoprazole 40 MG tablet Commonly known as: PROTONIX Take 40 mg by mouth daily.    PARoxetine 20 MG tablet Commonly known as: PAXIL Take 20 mg by mouth 2 (two) times daily.   potassium chloride SA 20 MEQ tablet Commonly known as: KLOR-CON M Take 40 mEq by mouth daily.   sodium chloride 0.65 % Soln nasal spray Commonly known as: OCEAN Place 2 sprays into both nostrils daily as needed for congestion.   torsemide 20 MG tablet Commonly known as: DEMADEX Take 1 tablet (20 mg total) by mouth daily.   vitamin B-12 1000 MCG tablet Commonly known as: CYANOCOBALAMIN Take 1,000 mcg by mouth daily.   Vitamin D3 50 MCG (  2000 UT) Tabs Take 1 tablet by mouth daily.        Review of Systems  Constitutional:  Negative for activity change, appetite change, chills, diaphoresis, fatigue, fever and unexpected weight change.  Respiratory:  Negative for cough, shortness of breath, wheezing and stridor.   Cardiovascular:  Negative for chest pain, palpitations and leg swelling.  Gastrointestinal:  Negative for abdominal distention, abdominal pain, constipation and diarrhea.  Genitourinary:  Negative for difficulty urinating and dysuria.  Musculoskeletal:  Positive for gait problem. Negative for arthralgias, back pain, joint swelling and myalgias.  Neurological:  Negative for dizziness, seizures, syncope, facial asymmetry, speech difficulty, weakness and headaches.  Hematological:  Negative for adenopathy. Does not bruise/bleed easily.  Psychiatric/Behavioral:  Positive for behavioral problems. Negative for agitation and confusion.    Immunization History  Administered Date(s) Administered   Influenza, High Dose Seasonal PF 01/21/2019, 01/10/2021   Influenza-Unspecified 12/06/2014, 11/27/2017, 02/01/2020   Moderna Covid-19 Vaccine Bivalent Booster 22yr & up 01/17/2021   Moderna SARS-COV2 Booster Vaccination 11/28/2020   Moderna Sars-Covid-2 Vaccination 04/13/2019, 05/11/2019, 02/18/2020   Pneumococcal Conjugate-13 05/21/2018   Pneumococcal Polysaccharide-23 05/21/2005    Tdap 06/03/2006, 10/22/2018   Zoster Recombinat (Shingrix) 10/22/2018, 12/24/2018   Pertinent  Health Maintenance Due  Topic Date Due   URINE MICROALBUMIN  03/11/2021 (Originally 01/07/1939)   INFLUENZA VACCINE  Completed   Fall Risk 05/12/2018 10/22/2018 07/20/2019 11/11/2019 12/07/2020  Falls in the past year? 1 1 - 1 1  Was there an injury with Fall? 0 1 - 1 1  Fall Risk Category Calculator 2 3 - 2 3  Fall Risk Category Moderate High - Moderate High  Patient Fall Risk Level Moderate fall risk - - High fall risk High fall risk  Patient at Risk for Falls Due to Impaired mobility;History of fall(s);Impaired balance/gait;Mental status change;Medication side effect History of fall(s);Impaired mobility History of fall(s);Impaired balance/gait;Impaired mobility;Mental status change;Medication side effect History of fall(s);Impaired balance/gait Impaired balance/gait;History of fall(s)  Fall risk Follow up Falls evaluation completed;Falls prevention discussed;Education provided Falls evaluation completed;Falls prevention discussed Education provided;Falls prevention discussed;Falls evaluation completed Falls evaluation completed Falls evaluation completed;Follow up appointment  Fall risk Follow up - - at [facility]    Vitals:   03/08/21 1549  BP: (!) 153/74  Pulse: 96  Resp: (!) 21  Temp: (!) 97.5 F (36.4 C)  SpO2: 92%  Weight: 169 lb 12.8 oz (77 kg)  Height: 5' 9.8" (1.773 m)   Body mass index is 24.5 kg/m. Physical Exam Vitals reviewed.  Constitutional:      General: He is not in acute distress.    Appearance: He is not diaphoretic.  HENT:     Head: Normocephalic and atraumatic.  Neck:     Thyroid: No thyromegaly.     Vascular: No JVD.     Trachea: No tracheal deviation.  Cardiovascular:     Rate and Rhythm: Normal rate and regular rhythm.     Heart sounds: No murmur heard. Pulmonary:     Effort: Pulmonary effort is normal. No respiratory  distress.     Breath sounds: Normal breath sounds. No wheezing.  Abdominal:     General: Bowel sounds are normal. There is no distension.     Palpations: Abdomen is soft.     Tenderness: There is no abdominal tenderness.  Musculoskeletal:     Cervical back: Normal range of motion and neck supple.     Right lower leg: No edema.  Left lower leg: No edema.  Lymphadenopathy:     Cervical: No cervical adenopathy.  Skin:    General: Skin is warm and dry.  Neurological:     Mental Status: He is alert and oriented to person, place, and time.     Cranial Nerves: No cranial nerve deficit.  Psychiatric:        Mood and Affect: Mood normal.    Labs reviewed: Recent Labs    07/13/20 0000 02/12/21 0000  NA 139 142  K 3.6 3.7  CL 100 101  CO2 24* 27*  BUN 20 19  CREATININE 1.1 1.1  CALCIUM 9.1 8.9   Recent Labs    07/13/20 0000  AST 33  ALT 8*  ALKPHOS 83  ALBUMIN 4.5   Recent Labs    07/13/20 0000  WBC 7.4  HGB 12.5*  HCT 37*  PLT 232   Lab Results  Component Value Date   TSH 3.60 10/16/2020   No results found for: HGBA1C Lab Results  Component Value Date   CHOL 125 07/13/2020   HDL 60 07/13/2020   LDLCALC 45 07/13/2020   TRIG 100 07/13/2020    Significant Diagnostic Results in last 30 days:  No results found.  Assessment/Plan  1. Coronary artery disease involving native coronary artery of native heart without angina pectoris With prior hx of cardiac cath and stent placement On statin and asa  2. Essential hypertension Controlled  3. Vasomotor rhinitis No current symptoms  4. Gastroesophageal reflux disease without esophagitis Controlled with ppi therapy.   5. Parkinson disease ([place]) Followed by neurology on sinemet No tremor.  Has falls and slow movements.   6. Dementia due to Parkinson's disease without behavioral disturbance ([place]) Currently on Aricept Remains alert and oriented and able to participate in activities.   7. Crohn's  disease of small intestine without complication ([place]) No new issues Continue questran and align  8. Mixed obsessional thoughts and acts Slight improvement noted by staff Continue paxil F/U with psych    Family/ staff Communication: resident   Labs/tests ordered:  NA

## 2021-03-12 DIAGNOSIS — R41842 Visuospatial deficit: Secondary | ICD-10-CM | POA: Diagnosis not present

## 2021-03-12 DIAGNOSIS — G20 Parkinson's disease: Secondary | ICD-10-CM | POA: Diagnosis not present

## 2021-03-12 DIAGNOSIS — F428 Other obsessive-compulsive disorder: Secondary | ICD-10-CM | POA: Diagnosis not present

## 2021-03-12 DIAGNOSIS — R2689 Other abnormalities of gait and mobility: Secondary | ICD-10-CM | POA: Diagnosis not present

## 2021-03-12 DIAGNOSIS — R296 Repeated falls: Secondary | ICD-10-CM | POA: Diagnosis not present

## 2021-03-12 DIAGNOSIS — M6389 Disorders of muscle in diseases classified elsewhere, multiple sites: Secondary | ICD-10-CM | POA: Diagnosis not present

## 2021-03-12 DIAGNOSIS — R293 Abnormal posture: Secondary | ICD-10-CM | POA: Diagnosis not present

## 2021-03-14 DIAGNOSIS — R41842 Visuospatial deficit: Secondary | ICD-10-CM | POA: Diagnosis not present

## 2021-03-14 DIAGNOSIS — R2689 Other abnormalities of gait and mobility: Secondary | ICD-10-CM | POA: Diagnosis not present

## 2021-03-14 DIAGNOSIS — F428 Other obsessive-compulsive disorder: Secondary | ICD-10-CM | POA: Diagnosis not present

## 2021-03-14 DIAGNOSIS — G20 Parkinson's disease: Secondary | ICD-10-CM | POA: Diagnosis not present

## 2021-03-14 DIAGNOSIS — R293 Abnormal posture: Secondary | ICD-10-CM | POA: Diagnosis not present

## 2021-03-14 DIAGNOSIS — R296 Repeated falls: Secondary | ICD-10-CM | POA: Diagnosis not present

## 2021-03-14 DIAGNOSIS — M6389 Disorders of muscle in diseases classified elsewhere, multiple sites: Secondary | ICD-10-CM | POA: Diagnosis not present

## 2021-03-15 DIAGNOSIS — R2689 Other abnormalities of gait and mobility: Secondary | ICD-10-CM | POA: Diagnosis not present

## 2021-03-15 DIAGNOSIS — F428 Other obsessive-compulsive disorder: Secondary | ICD-10-CM | POA: Diagnosis not present

## 2021-03-15 DIAGNOSIS — R293 Abnormal posture: Secondary | ICD-10-CM | POA: Diagnosis not present

## 2021-03-15 DIAGNOSIS — R296 Repeated falls: Secondary | ICD-10-CM | POA: Diagnosis not present

## 2021-03-15 DIAGNOSIS — M6389 Disorders of muscle in diseases classified elsewhere, multiple sites: Secondary | ICD-10-CM | POA: Diagnosis not present

## 2021-03-15 DIAGNOSIS — G20 Parkinson's disease: Secondary | ICD-10-CM | POA: Diagnosis not present

## 2021-03-15 DIAGNOSIS — R41842 Visuospatial deficit: Secondary | ICD-10-CM | POA: Diagnosis not present

## 2021-03-19 DIAGNOSIS — R296 Repeated falls: Secondary | ICD-10-CM | POA: Diagnosis not present

## 2021-03-19 DIAGNOSIS — R2689 Other abnormalities of gait and mobility: Secondary | ICD-10-CM | POA: Diagnosis not present

## 2021-03-19 DIAGNOSIS — R41842 Visuospatial deficit: Secondary | ICD-10-CM | POA: Diagnosis not present

## 2021-03-19 DIAGNOSIS — F428 Other obsessive-compulsive disorder: Secondary | ICD-10-CM | POA: Diagnosis not present

## 2021-03-19 DIAGNOSIS — M6389 Disorders of muscle in diseases classified elsewhere, multiple sites: Secondary | ICD-10-CM | POA: Diagnosis not present

## 2021-03-19 DIAGNOSIS — R293 Abnormal posture: Secondary | ICD-10-CM | POA: Diagnosis not present

## 2021-03-19 DIAGNOSIS — G20 Parkinson's disease: Secondary | ICD-10-CM | POA: Diagnosis not present

## 2021-03-21 ENCOUNTER — Encounter: Payer: PPO | Admitting: Internal Medicine

## 2021-03-21 DIAGNOSIS — G20 Parkinson's disease: Secondary | ICD-10-CM | POA: Diagnosis not present

## 2021-03-21 DIAGNOSIS — R2689 Other abnormalities of gait and mobility: Secondary | ICD-10-CM | POA: Diagnosis not present

## 2021-03-21 DIAGNOSIS — R293 Abnormal posture: Secondary | ICD-10-CM | POA: Diagnosis not present

## 2021-03-21 DIAGNOSIS — R296 Repeated falls: Secondary | ICD-10-CM | POA: Diagnosis not present

## 2021-03-21 DIAGNOSIS — R41842 Visuospatial deficit: Secondary | ICD-10-CM | POA: Diagnosis not present

## 2021-03-21 DIAGNOSIS — M6389 Disorders of muscle in diseases classified elsewhere, multiple sites: Secondary | ICD-10-CM | POA: Diagnosis not present

## 2021-03-21 DIAGNOSIS — F428 Other obsessive-compulsive disorder: Secondary | ICD-10-CM | POA: Diagnosis not present

## 2021-03-22 ENCOUNTER — Non-Acute Institutional Stay (SKILLED_NURSING_FACILITY): Payer: PPO | Admitting: Adult Health

## 2021-03-22 ENCOUNTER — Encounter: Payer: Self-pay | Admitting: Adult Health

## 2021-03-22 DIAGNOSIS — J310 Chronic rhinitis: Secondary | ICD-10-CM | POA: Diagnosis not present

## 2021-03-22 MED ORDER — LORATADINE 10 MG PO TABS
10.0000 mg | ORAL_TABLET | Freq: Every day | ORAL | 11 refills | Status: DC
Start: 2021-03-22 — End: 2021-06-19

## 2021-03-22 NOTE — Progress Notes (Signed)
Location:   [facility] Room Number: 825-K Place of Service:  SNF [phone]) Provider:  Royal Hawthorn, NP    Patient Care Team: [name], MD as PCP - General (Internal Medicine) [name], [name], MD as PCP - Cardiology (Cardiology) [name], [name], MD as Consulting Physician (Cardiology) [name], MD as Attending Physician (Gastroenterology) [name], MD as Consulting Physician (Neurology) Community[facility] ([place]) Royal Hawthorn, NP as Nurse Practitioner (Nurse Practitioner)  Extended Emergency Contact Information Primary Emergency Contact: [name] Address: [address] [address] [place]          [place], [place] [address] [name] of [place] Phone: [phone] Mobile Phone: [phone] Relation: Spouse  Code Status:  DNR Goals of care: Advanced Directive information Advanced Directives 03/22/2021  Does Patient Have a Medical Advance Directive? Yes  Type of Paramedic of [place];Living will;Out of facility DNR (pink MOST or yellow form)  Does patient want to make changes to medical advance directive? No - Patient declined  Copy of [place] in Chart? Yes - validated most recent copy scanned in chart (See row information)  Pre-existing out of facility DNR order (yellow form or pink MOST form) -     Chief Complaint  Patient presents with   Acute Visit    Nasal Congestion.    HPI:  Pt is a 87 y.o. male seen today for an acute visit for nasal congestion and rhinorrhea. Symptoms have been present for months. Sometimes he has sputum production as well. No sore throat or cough. He is on Atrovent. He would like to try something by mouth. He says that he has had a hx of allergies and was [place] [facility] [place]. He is eating and drinking well. Does not feel ill.    Past Medical History:  Diagnosis Date   Cellulitis     Chronic diastolic CHF (congestive heart failure) ([facility])    a. EF initially 35-40% after MI 1/09; b. echo 7/08: EF 60%;  c. 11/2014 Echo: EF 65-70%, Gr 1 DD, mild MR.   Coronary artery disease    a. s/p anterior STEMI 04/2007 with BMS-> LAD;  b. Cath 12/2011 patent stent;  c. low risk nuc 04/2014.   Crohn's disease ([facility])    Diaphragmatic hernia without mention of obstruction or gangrene    Diverticulosis of colon (without mention of hemorrhage)    Fatty liver    a. on ultrasound of 10/2009   Flatulence, eructation, and gas pain    Fungal infection    Gait disorder    GERD (gastroesophageal reflux disease)    HOH (hard of hearing)    Hearing aids   Hyperlipidemia    Hypertension    Ischemic cardiomyopathy    a. EF initially 35-40% after MI 1/09; b. echo 7/08: EF 60%;  c. 11/2014 Echo: EF 65-70%, Gr 1 DD, mild MR.   Melanoma ([facility])    Memory disorder    Orthostatic hypotension    Osteoporosis    Other chronic nonalcoholic liver disease    Other esophagitis    Parkinson's disease ([facility])    RBBB 09/02/2013   Regional enteritis of small intestine ([facility])    Renal calculi    Restless leg syndrome 03/22/2015   Ulcerative (chronic) ileocolitis ([facility])    Urinary incontinence    Past Surgical History:  Procedure Laterality Date   APPENDECTOMY     BACK SURGERY  L3, L4, L5   CARDIAC CATHETERIZATION  09/25/06   EF 35-40% but more recently 60%   CATARACT EXTRACTION     Bilateral   CHOLECYSTECTOMY     Coronary artery stent placement     Melanoma resection     Partial bowel resection     TONSILLECTOMY      No Known Allergies  Allergies as of 03/22/2021   No Known Allergies      Medication List        Accurate as of March 22, 2021 10:30 AM. If you have any questions, ask your nurse or doctor.          acetaminophen 325 MG tablet Commonly known as: TYLENOL Take 650 mg by mouth every 6 (six) hours as needed.   acetaminophen 325 MG tablet Commonly known as: TYLENOL Take 650  mg by mouth at bedtime. And q 6 hrs prn   Align 4 MG Caps Take 1 capsule by mouth daily.   aspirin EC 81 MG tablet Take 81 mg by mouth daily.   atorvastatin 40 MG tablet Commonly known as: LIPITOR Take 40 mg by mouth daily.   calcium carbonate 600 MG Tabs tablet Commonly known as: OS-CAL Take 600 mg by mouth daily with breakfast.   carbidopa-levodopa 25-100 MG tablet Commonly known as: SINEMET IR Take 1 tablet by mouth 4 (four) times daily.   carbidopa-levodopa 50-200 MG tablet Commonly known as: SINEMET CR Take 1 tablet by mouth 4 (four) times daily.   cholestyramine 4 g packet Commonly known as: QUESTRAN TAKE 1 PACKET BY MOUTH TWICE DAILY WITH A MEAL   diclofenac Sodium 1 % Gel Commonly known as: VOLTAREN Apply topically 4 (four) times daily as needed (Apply to the affected right ankle).   donepezil 10 MG tablet Commonly known as: ARICEPT TAKE ONE TABLET AT BEDTIME   ipratropium 0.03 % nasal spray Commonly known as: ATROVENT Place 2 sprays into both nostrils 3 (three) times daily.   lidocaine 2 % solution Commonly known as: XYLOCAINE Use as directed 15 mLs in the mouth or throat as needed for mouth pain.   midodrine 5 MG tablet Commonly known as: PROAMATINE Take 1 tablet (5 mg total) by mouth 2 (two) times daily with a meal.   multivitamin-iron-minerals-folic acid chewable tablet Chew 1 tablet by mouth daily.   nitroGLYCERIN 0.4 MG SL tablet Commonly known as: NITROSTAT Place 1 tablet (0.4 mg total) under the tongue every 5 (five) minutes as needed for chest pain. PT OVERDUE FOR OV PLEASE CALL FOR APPT   pantoprazole 40 MG tablet Commonly known as: PROTONIX Take 40 mg by mouth daily.   PARoxetine 20 MG tablet Commonly known as: PAXIL Take 20 mg by mouth 2 (two) times daily.   potassium chloride SA 20 MEQ tablet Commonly known as: KLOR-CON M Take 40 mEq by mouth daily.   sodium chloride 0.65 % Soln nasal spray Commonly known as: OCEAN Place 2  sprays into both nostrils daily as needed for congestion.   torsemide 20 MG tablet Commonly known as: DEMADEX Take 1 tablet (20 mg total) by mouth daily.   vitamin B-12 1000 MCG tablet Commonly known as: CYANOCOBALAMIN Take 1,000 mcg by mouth daily.   Vitamin D3 50 MCG (2000 UT) Tabs Take 1 tablet by mouth daily.        Review of Systems  Constitutional:  Negative for activity change, appetite change, chills, diaphoresis, fatigue, fever and unexpected weight change.  HENT:  Positive for congestion  and rhinorrhea. Negative for ear discharge, ear pain, facial swelling, sinus pressure, sinus pain, sneezing, sore throat, tinnitus and trouble swallowing.   Respiratory:  Negative for cough, chest tightness, shortness of breath, wheezing and stridor.   Cardiovascular:  Negative for chest pain, palpitations and leg swelling.  Gastrointestinal:  Negative for abdominal distention, abdominal pain, constipation and diarrhea.  Genitourinary:  Negative for difficulty urinating and dysuria.  Musculoskeletal:  Positive for gait problem. Negative for arthralgias, back pain, joint swelling and myalgias.  Neurological:  Positive for weakness. Negative for dizziness, seizures, syncope, facial asymmetry, speech difficulty and headaches.  Hematological:  Negative for adenopathy. Does not bruise/bleed easily.  Psychiatric/Behavioral:  Positive for confusion. Negative for agitation and behavioral problems.    Immunization History  Administered Date(s) Administered   Influenza, High Dose Seasonal PF 01/21/2019, 01/10/2021   Influenza-Unspecified 12/06/2014, 11/27/2017, 02/01/2020   Moderna Covid-19 Vaccine Bivalent Booster 21yr & up 01/17/2021   Moderna SARS-COV2 Booster Vaccination 11/28/2020   Moderna Sars-Covid-2 Vaccination 04/13/2019, 05/11/2019, 02/18/2020   Pneumococcal Conjugate-13 05/21/2018   Pneumococcal Polysaccharide-23 05/21/2005   Tdap 06/03/2006, 10/22/2018   Zoster Recombinat  (Shingrix) 10/22/2018, 12/24/2018   Pertinent  Health Maintenance Due  Topic Date Due   URINE MICROALBUMIN  Never done   INFLUENZA VACCINE  Completed   Fall Risk 05/12/2018 10/22/2018 07/20/2019 11/11/2019 12/07/2020  Falls in the past year? 1 1 - 1 1  Was there an injury with Fall? 0 1 - 1 1  Fall Risk Category Calculator 2 3 - 2 3  Fall Risk Category Moderate High - Moderate High  Patient Fall Risk Level Moderate fall risk - - High fall risk High fall risk  Patient at Risk for Falls Due to Impaired mobility;History of fall(s);Impaired balance/gait;Mental status change;Medication side effect History of fall(s);Impaired mobility History of fall(s);Impaired balance/gait;Impaired mobility;Mental status change;Medication side effect History of fall(s);Impaired balance/gait Impaired balance/gait;History of fall(s)  Fall risk Follow up Falls evaluation completed;Falls prevention discussed;Education provided Falls evaluation completed;Falls prevention discussed Education provided;Falls prevention discussed;Falls evaluation completed Falls evaluation completed Falls evaluation completed;Follow up appointment  Fall risk Follow up - - [facility]    Vitals:   03/22/21 1024  BP: 140/67  Pulse: 96  Resp: 18  Temp: 97.8 F (36.6 C)  SpO2: 95%  Weight: 162 lb 14.4 oz (73.9 kg)  Height: 5' 9.8" (1.773 m)   Body mass index is 23.51 kg/m. Physical Exam Vitals reviewed.  Constitutional:      General: He is not in acute distress.    Appearance: He is not diaphoretic.  HENT:     Head: Normocephalic and atraumatic.     Nose: Congestion and rhinorrhea present.     Mouth/Throat:     Mouth: Mucous membranes are moist.     Pharynx: No oropharyngeal exudate.  Neck:     Thyroid: No thyromegaly.     Vascular: No JVD.     Trachea: No tracheal deviation.  Cardiovascular:     Rate and Rhythm: Normal rate and regular rhythm.     Heart sounds: No murmur heard. Pulmonary:      Effort: Pulmonary effort is normal. No respiratory distress.     Breath sounds: Normal breath sounds. No wheezing.  Abdominal:     General: Bowel sounds are normal. There is no distension.     Palpations: Abdomen is soft.     Tenderness: There is no abdominal tenderness.  Lymphadenopathy:     Cervical: No  cervical adenopathy.  Skin:    General: Skin is warm and dry.  Neurological:     Mental Status: He is alert and oriented to person, place, and time.     Cranial Nerves: No cranial nerve deficit.    Labs reviewed: Recent Labs    07/13/20 0000 02/12/21 0000  NA 139 142  K 3.6 3.7  CL 100 101  CO2 24* 27*  BUN 20 19  CREATININE 1.1 1.1  CALCIUM 9.1 8.9   Recent Labs    07/13/20 0000  AST 33  ALT 8*  ALKPHOS 83  ALBUMIN 4.5   Recent Labs    07/13/20 0000  WBC 7.4  HGB 12.5*  HCT 37*  PLT 232   Lab Results  Component Value Date   TSH 3.60 10/16/2020   No results found for: HGBA1C Lab Results  Component Value Date   CHOL 125 07/13/2020   HDL 60 07/13/2020   LDLCALC 45 07/13/2020   TRIG 100 07/13/2020    Significant Diagnostic Results in last 30 days:  No results found.  Assessment/Plan  1. Chronic rhinitis If not improving add flonase.  - loratadine (CLARITIN) 10 MG tablet; Take 1 tablet (10 mg total) by mouth daily.  Dispense: 30 tablet; Refill: 11   Family/ staff Communication: nurse and resident   Labs/tests ordered:  NA

## 2021-03-26 DIAGNOSIS — R2689 Other abnormalities of gait and mobility: Secondary | ICD-10-CM | POA: Diagnosis not present

## 2021-03-26 DIAGNOSIS — R41842 Visuospatial deficit: Secondary | ICD-10-CM | POA: Diagnosis not present

## 2021-03-26 DIAGNOSIS — R296 Repeated falls: Secondary | ICD-10-CM | POA: Diagnosis not present

## 2021-03-26 DIAGNOSIS — F428 Other obsessive-compulsive disorder: Secondary | ICD-10-CM | POA: Diagnosis not present

## 2021-03-26 DIAGNOSIS — R293 Abnormal posture: Secondary | ICD-10-CM | POA: Diagnosis not present

## 2021-03-26 DIAGNOSIS — M6389 Disorders of muscle in diseases classified elsewhere, multiple sites: Secondary | ICD-10-CM | POA: Diagnosis not present

## 2021-03-26 DIAGNOSIS — G20 Parkinson's disease: Secondary | ICD-10-CM | POA: Diagnosis not present

## 2021-03-28 DIAGNOSIS — M6389 Disorders of muscle in diseases classified elsewhere, multiple sites: Secondary | ICD-10-CM | POA: Diagnosis not present

## 2021-03-28 DIAGNOSIS — F428 Other obsessive-compulsive disorder: Secondary | ICD-10-CM | POA: Diagnosis not present

## 2021-03-28 DIAGNOSIS — R296 Repeated falls: Secondary | ICD-10-CM | POA: Diagnosis not present

## 2021-03-28 DIAGNOSIS — G20 Parkinson's disease: Secondary | ICD-10-CM | POA: Diagnosis not present

## 2021-03-28 DIAGNOSIS — R293 Abnormal posture: Secondary | ICD-10-CM | POA: Diagnosis not present

## 2021-03-28 DIAGNOSIS — R2689 Other abnormalities of gait and mobility: Secondary | ICD-10-CM | POA: Diagnosis not present

## 2021-03-28 DIAGNOSIS — R41842 Visuospatial deficit: Secondary | ICD-10-CM | POA: Diagnosis not present

## 2021-04-04 DIAGNOSIS — G20 Parkinson's disease: Secondary | ICD-10-CM | POA: Diagnosis not present

## 2021-04-04 DIAGNOSIS — R2689 Other abnormalities of gait and mobility: Secondary | ICD-10-CM | POA: Diagnosis not present

## 2021-04-04 DIAGNOSIS — R293 Abnormal posture: Secondary | ICD-10-CM | POA: Diagnosis not present

## 2021-04-04 DIAGNOSIS — R41842 Visuospatial deficit: Secondary | ICD-10-CM | POA: Diagnosis not present

## 2021-04-04 DIAGNOSIS — R296 Repeated falls: Secondary | ICD-10-CM | POA: Diagnosis not present

## 2021-04-04 DIAGNOSIS — M6389 Disorders of muscle in diseases classified elsewhere, multiple sites: Secondary | ICD-10-CM | POA: Diagnosis not present

## 2021-04-04 DIAGNOSIS — F428 Other obsessive-compulsive disorder: Secondary | ICD-10-CM | POA: Diagnosis not present

## 2021-04-09 DIAGNOSIS — F428 Other obsessive-compulsive disorder: Secondary | ICD-10-CM | POA: Diagnosis not present

## 2021-04-09 DIAGNOSIS — R2689 Other abnormalities of gait and mobility: Secondary | ICD-10-CM | POA: Diagnosis not present

## 2021-04-09 DIAGNOSIS — R296 Repeated falls: Secondary | ICD-10-CM | POA: Diagnosis not present

## 2021-04-09 DIAGNOSIS — G20 Parkinson's disease: Secondary | ICD-10-CM | POA: Diagnosis not present

## 2021-04-09 DIAGNOSIS — M6389 Disorders of muscle in diseases classified elsewhere, multiple sites: Secondary | ICD-10-CM | POA: Diagnosis not present

## 2021-04-09 DIAGNOSIS — R41842 Visuospatial deficit: Secondary | ICD-10-CM | POA: Diagnosis not present

## 2021-04-09 DIAGNOSIS — R293 Abnormal posture: Secondary | ICD-10-CM | POA: Diagnosis not present

## 2021-04-11 DIAGNOSIS — R296 Repeated falls: Secondary | ICD-10-CM | POA: Diagnosis not present

## 2021-04-11 DIAGNOSIS — G20 Parkinson's disease: Secondary | ICD-10-CM | POA: Diagnosis not present

## 2021-04-11 DIAGNOSIS — R2689 Other abnormalities of gait and mobility: Secondary | ICD-10-CM | POA: Diagnosis not present

## 2021-04-11 DIAGNOSIS — R293 Abnormal posture: Secondary | ICD-10-CM | POA: Diagnosis not present

## 2021-04-11 DIAGNOSIS — F428 Other obsessive-compulsive disorder: Secondary | ICD-10-CM | POA: Diagnosis not present

## 2021-04-11 DIAGNOSIS — R41842 Visuospatial deficit: Secondary | ICD-10-CM | POA: Diagnosis not present

## 2021-04-11 DIAGNOSIS — M6389 Disorders of muscle in diseases classified elsewhere, multiple sites: Secondary | ICD-10-CM | POA: Diagnosis not present

## 2021-04-13 DIAGNOSIS — R2689 Other abnormalities of gait and mobility: Secondary | ICD-10-CM | POA: Diagnosis not present

## 2021-04-13 DIAGNOSIS — G20 Parkinson's disease: Secondary | ICD-10-CM | POA: Diagnosis not present

## 2021-04-13 DIAGNOSIS — R41842 Visuospatial deficit: Secondary | ICD-10-CM | POA: Diagnosis not present

## 2021-04-13 DIAGNOSIS — M6389 Disorders of muscle in diseases classified elsewhere, multiple sites: Secondary | ICD-10-CM | POA: Diagnosis not present

## 2021-04-13 DIAGNOSIS — F428 Other obsessive-compulsive disorder: Secondary | ICD-10-CM | POA: Diagnosis not present

## 2021-04-13 DIAGNOSIS — R293 Abnormal posture: Secondary | ICD-10-CM | POA: Diagnosis not present

## 2021-04-13 DIAGNOSIS — R296 Repeated falls: Secondary | ICD-10-CM | POA: Diagnosis not present

## 2021-04-16 DIAGNOSIS — G20 Parkinson's disease: Secondary | ICD-10-CM | POA: Diagnosis not present

## 2021-04-16 DIAGNOSIS — R2689 Other abnormalities of gait and mobility: Secondary | ICD-10-CM | POA: Diagnosis not present

## 2021-04-16 DIAGNOSIS — R296 Repeated falls: Secondary | ICD-10-CM | POA: Diagnosis not present

## 2021-04-16 DIAGNOSIS — F428 Other obsessive-compulsive disorder: Secondary | ICD-10-CM | POA: Diagnosis not present

## 2021-04-16 DIAGNOSIS — R293 Abnormal posture: Secondary | ICD-10-CM | POA: Diagnosis not present

## 2021-04-16 DIAGNOSIS — M6389 Disorders of muscle in diseases classified elsewhere, multiple sites: Secondary | ICD-10-CM | POA: Diagnosis not present

## 2021-04-16 DIAGNOSIS — R41842 Visuospatial deficit: Secondary | ICD-10-CM | POA: Diagnosis not present

## 2021-04-27 DIAGNOSIS — R296 Repeated falls: Secondary | ICD-10-CM | POA: Diagnosis not present

## 2021-04-27 DIAGNOSIS — M6389 Disorders of muscle in diseases classified elsewhere, multiple sites: Secondary | ICD-10-CM | POA: Diagnosis not present

## 2021-04-27 DIAGNOSIS — F428 Other obsessive-compulsive disorder: Secondary | ICD-10-CM | POA: Diagnosis not present

## 2021-04-27 DIAGNOSIS — R293 Abnormal posture: Secondary | ICD-10-CM | POA: Diagnosis not present

## 2021-04-27 DIAGNOSIS — R2689 Other abnormalities of gait and mobility: Secondary | ICD-10-CM | POA: Diagnosis not present

## 2021-04-27 DIAGNOSIS — R41842 Visuospatial deficit: Secondary | ICD-10-CM | POA: Diagnosis not present

## 2021-04-27 DIAGNOSIS — G20 Parkinson's disease: Secondary | ICD-10-CM | POA: Diagnosis not present

## 2021-05-01 ENCOUNTER — Ambulatory Visit: Payer: PPO | Admitting: Neurology

## 2021-05-01 VITALS — BP 122/71 | HR 103 | Ht 69.0 in

## 2021-05-01 DIAGNOSIS — G20 Parkinson's disease: Secondary | ICD-10-CM

## 2021-05-01 DIAGNOSIS — G20A1 Parkinson's disease without dyskinesia, without mention of fluctuations: Secondary | ICD-10-CM

## 2021-05-01 DIAGNOSIS — Z9181 History of falling: Secondary | ICD-10-CM | POA: Diagnosis not present

## 2021-05-01 DIAGNOSIS — R413 Other amnesia: Secondary | ICD-10-CM | POA: Diagnosis not present

## 2021-05-01 NOTE — Progress Notes (Signed)
Mood subjective:    [name] is a 88 y.o. male.  HPI    Interim history:   [name] [name] is a 88 year old right-handed gentleman with an underlying medical complex medical history of chronic diastolic congestive heart failure, coronary artery disease, Crohn's disease, diverticulosis, reflux disease, hearing loss, hypertension, hyperlipidemia, ischemic cardiomyopathy, orthostatic hypotension, osteoporosis, right bundle branch block, and memory loss, who presents for follow-up consultation of his parkinsonism for which he has been followed by Dr. Jannifer Franklin.  He is accompanied by her daughter, [name] today. He was last seen by Dr. Jannifer Franklin on 11/28/2020 and I reviewed the note.  I copied the note below for reference.  He has had more memory loss.  He has been on carbidopa-levodopa long-acting as well as immediately release, 1 pill 4 times a day each.  He takes donepezil 10 mg once daily.  Today, 05/01/2021: He reports doing quite well, he feels stable.  He does report a recent fall in the bathroom as he was getting up on his own, he is advised to request assistance with this.  He has PT about 3 days a week, they work with him with a gait belt.  He does not walk independently.  He is in skilled nursing that has been there for about 3 or 4 years per daughter's assessment, his wife is in independent living.  He does have some OCD features which are generally under good control, he does bathe up to twice daily, requires assistance, they have hired help 7 mornings out of the week and additional help 3 evenings out of the week.  Appetite is good.  He takes Sinemet IR and Sinemet CR 1 pill 4 times daily at 8, 12, 4 PM and 8 PM daily, donepezil 10 mg in the evenings. His Parkinson sy'smptoms started several years ago, his daughter recalls, he had stiffness and fine motor dyscontrol, more so on the left side.   The patient's allergies, current medications, family history, past medical history, past social  history, past surgical history and problem list were reviewed and updated as appropriate.   Previously:   11/28/20 (Dr. Jannifer Franklin): <<[name] [name] is an 88 year old right-handed white male with a history of Parkinson's disease.  He resides at [facility].  He has lost his ability to ambulate independently, he will walk some with assistance, they use a PT belt and walk behind him.  He has a very definite tendency to lean backwards.  He has not had any falls however.  He is sleeping well at night, and he eats well.  He has occasional tremors of the right arm.  He continues to have OCD behavior, he will take multiple baths during the day.  The wife comes with him today, she believes there has been some progression of memory issues.  He does not remember things that were told him earlier in the day.  He denies any hallucinations or agitation.  He comes to the office today for further evaluation.  He remains on Sinemet CR 50/200 mg 4 times daily and Sinemet IR 25/100 mg tablets, 1 tablet 4 times daily.  He tolerates his medications well.  He sleeps well at night.>>  His Past Medical History Is Significant For: Past Medical History:  Diagnosis Date   Cellulitis    Chronic diastolic CHF (congestive heart failure) ([place])    a. EF initially 35-40% after MI 1/09; b. echo 7/08: EF 60%;  c. 11/2014 Echo: EF 65-70%, Gr 1 DD, mild MR.  Coronary artery disease    a. s/p anterior STEMI 04/2007 with BMS-> LAD;  b. Cath 12/2011 patent stent;  c. low risk nuc 04/2014.   Crohn's disease ([place])    Diaphragmatic hernia without mention of obstruction or gangrene    Diverticulosis of colon (without mention of hemorrhage)    Fatty liver    a. on ultrasound of 10/2009   Flatulence, eructation, and gas pain    Fungal infection    Gait disorder    GERD (gastroesophageal reflux disease)    HOH (hard of hearing)    Hearing aids   Hyperlipidemia    Hypertension    Ischemic cardiomyopathy    a. EF initially  35-40% after MI 1/09; b. echo 7/08: EF 60%;  c. 11/2014 Echo: EF 65-70%, Gr 1 DD, mild MR.   Melanoma ([place])    Memory disorder    Orthostatic hypotension    Osteoporosis    Other chronic nonalcoholic liver disease    Other esophagitis    Parkinson's disease ([place])    RBBB 09/02/2013   Regional enteritis of small intestine ([facility])    Renal calculi    Restless leg syndrome 03/22/2015   Ulcerative (chronic) ileocolitis ([facility])    Urinary incontinence     His Past Surgical History Is Significant For: Past Surgical History:  Procedure Laterality Date   APPENDECTOMY     BACK SURGERY     L3, L4, L5   CARDIAC CATHETERIZATION  09/25/06   EF 35-40% but more recently 60%   CATARACT EXTRACTION     Bilateral   CHOLECYSTECTOMY     Coronary artery stent placement     Melanoma resection     Partial bowel resection     TONSILLECTOMY      His Family History Is Significant For: Family History  Problem Relation Age of Onset   Heart failure Mother    Heart attack Mother    Hypertension Mother    Emphysema Father    Heart disease Brother    Heart disease Brother    Coronary artery disease Other    Colon cancer Brother        older at dx   Stomach cancer Neg Hx     His Social History Is Significant For: Social History   Socioeconomic History   Marital status: Married    Spouse name: [name]   Number of children: 3   Years of education: 16   Highest education level: Bachelor's degree (e.g., BA, AB, BS)  Occupational History   Occupation: CEO-retired    Employer: [employer]   Occupation: Licensed conveyancer: [employer]  Tobacco Use   Smoking status: Never   Smokeless tobacco: Never  Vaping Use   Vaping Use: Never used  Substance and Sexual Activity   Alcohol use: Yes    Alcohol/week: 2.0 - 3.0 standard drinks    Types: 2 - 3 Standard drinks or equivalent per week    Comment: occassionally   Drug use: No   Sexual activity: Not Currently  Other Topics Concern   Not  on file  Social History Narrative   Lives w/ his wife at [place]   Patient is right handed.   Patient drinks very little caffeine.   Social Determinants of Health   Financial Resource Strain: Not on file  Food Insecurity: Not on file  Transportation Needs: Not on file  Physical Activity: Not on file  Stress: Not on file  Social Connections:  Not on file    His Allergies Are:  No Known Allergies:   His Current Medications Are:  Outpatient Encounter Medications as of 05/01/2021  Medication Sig   acetaminophen (TYLENOL) 325 MG tablet Take 650 mg by mouth every 6 (six) hours as needed.   aspirin EC 81 MG tablet Take 81 mg by mouth daily.   atorvastatin (LIPITOR) 40 MG tablet Take 40 mg by mouth daily.   calcium carbonate (OS-CAL) 600 MG TABS tablet Take 600 mg by mouth daily with breakfast.   carbidopa-levodopa (SINEMET CR) 50-200 MG tablet Take 1 tablet by mouth 4 (four) times daily.   carbidopa-levodopa (SINEMET IR) 25-100 MG tablet Take 1 tablet by mouth 4 (four) times daily.   Cholecalciferol (VITAMIN D3) 50 MCG (2000 UT) TABS Take 1 tablet by mouth daily.   cholestyramine (QUESTRAN) 4 g packet TAKE 1 PACKET BY MOUTH TWICE DAILY WITH A MEAL   diclofenac Sodium (VOLTAREN) 1 % GEL Apply topically 4 (four) times daily as needed (Apply to the affected right ankle).   donepezil (ARICEPT) 10 MG tablet TAKE ONE TABLET AT BEDTIME   ipratropium (ATROVENT) 0.03 % nasal spray Place 2 sprays into both nostrils 3 (three) times daily.   lidocaine (XYLOCAINE) 2 % solution Use as directed 15 mLs in the mouth or throat as needed for mouth pain.   loratadine (CLARITIN) 10 MG tablet Take 1 tablet (10 mg total) by mouth daily.   midodrine (PROAMATINE) 5 MG tablet Take 1 tablet (5 mg total) by mouth 2 (two) times daily with a meal.   multivitamin-iron-minerals-folic acid (CENTRUM) chewable tablet Chew 1 tablet by mouth daily.   nitroGLYCERIN (NITROSTAT) 0.4 MG SL tablet Place 1  tablet (0.4 mg total) under the tongue every 5 (five) minutes as needed for chest pain. PT OVERDUE FOR OV PLEASE CALL FOR APPT   pantoprazole (PROTONIX) 40 MG tablet Take 40 mg by mouth daily.   PARoxetine (PAXIL) 20 MG tablet Take 20 mg by mouth 2 (two) times daily.   potassium chloride SA (K-DUR,KLOR-CON) 20 MEQ tablet Take 40 mEq by mouth daily.   Probiotic Product (ALIGN) 4 MG CAPS Take 1 capsule by mouth daily.    sodium chloride (OCEAN) 0.65 % SOLN nasal spray Place 2 sprays into both nostrils daily as needed for congestion.    torsemide (DEMADEX) 20 MG tablet Take 1 tablet (20 mg total) by mouth daily.   vitamin B-12 (CYANOCOBALAMIN) 1000 MCG tablet Take 1,000 mcg by mouth daily.   [DISCONTINUED] acetaminophen (TYLENOL) 325 MG tablet Take 650 mg by mouth at bedtime. And q 6 hrs prn   No facility-administered encounter medications on file as of 05/01/2021.  :  Review of Systems:  Out of a complete 14 point review of systems, all are reviewed and negative with the exception of these symptoms as listed below:  Review of Systems  Neurological:        Formally a Dr. Jannifer Franklin patient, last seen in August 2022. He is here today for a follow up on his parkinson's. Pt and daughter report he is doing well.    Objective:  Neurological Exam  Physical Exam Physical Examination:   Vitals:   05/01/21 1345  BP: 122/71  Pulse: (!) 103    General Examination: The patient is a very pleasant 88 y.o. male in no acute distress. He appears well-developed and well-nourished and well groomed.   HEENT: Normocephalic, atraumatic, pupils are equal, round and reactive to light, extraocular tracking is mildly  impaired, mild facial masking, moderate nuchal rigidity noted.  Speech is mildly hypophonic, no significant dysarthria noted.  No carotid bruits.  Airway examination reveals benign findings.  No lip, neck or jaw tremor.   Chest: Clear to auscultation without wheezing, rhonchi or crackles  noted.  Heart: S1+S2+0, regular and normal without murmurs, rubs or gallops noted.   Abdomen: Soft, non-tender and non-distended.  Extremities: There is trace pitting edema in the distal lower extremities bilaterally.   Skin: Warm and dry without trophic changes noted.   Musculoskeletal: exam reveals no obvious joint deformities, tenderness or joint swelling or erythema.   Neurologically:  Mental status: The patient is awake, alert and oriented in all 4 spheres. His immediate and remote memory, attention, language skills and fund of knowledge are mildly impaired.  There is no evidence of aphasia, agnosia, apraxia or anomia.  Details of his history are provided by his daughter.  Mood is normal and affect is normal.  MMSE - Mini Mental State Exam 12/07/2020 11/28/2020 06/15/2020 11/11/2019 09/30/2019 05/24/2019 12/16/2018  Not completed: - - - - - (No Data) -  Orientation to time 3 4 3 5 5 4 3   Orientation to Place 5 5 5 4 5 4 5   Registration 3 3 3 3 3 3 3   Attention/ Calculation 5 4 1 3 2 5 5   Recall 3 2 2 2 3 1 3   Language- name 2 objects 2 2 2 2 2 2 2   Language- repeat 1 1 1 1 1 1  0  Language- follow 3 step command 3 3 3 3 3 3 3   Language- read & follow direction 1 1 1 1 1 1 1   Write a sentence 1 1 1 1 1 1 1   Copy design 1 1 1 1 1 1 1   Total score 28 27 23 26 27 26 27    Cranial nerves II - XII are as described above under HEENT exam.  Motor exam: Normal bulk, strength and tone is noted. There is intermittent resting tremor in the right upper and right lower extremities.  Romberg is not tested for safety concerns, he has a mild postural tremor.  Fine motor skills and coordination: He has moderate difficulty with fine motor skills, more noticeable on the left compared to right.   Moderate bradykinesia.  No obvious dyskinesias. Cerebellar testing: No dysmetria or intention tremor. There is no truncal or gait ataxia.  Sensory exam: intact to light touch in the upper and lower extremities.  Gait,  station and balance: He is situated in a wheelchair.  I did not consider it safe to have him stand or walk for me today.   Assessment and plan:  In summary, AMI THORNSBERRY is a very pleasant 88 y.o.-year old male with an underlying medical complex medical history of chronic diastolic congestive heart failure, coronary artery disease, Crohn's disease, diverticulosis, reflux disease, hearing loss, hypertension, hyperlipidemia, ischemic cardiomyopathy, orthostatic hypotension, osteoporosis, right bundle branch block, and memory loss, who presents for follow-up consultation of his parkinsonism.  History and examination are in keeping with left-sided predominant Parkinson's disease, he has had associated memory loss.  He has had associated OCD in the recent few years.  He is currently on Paxil for this.  He is currently on levodopa 25-100 mg strength IR 1 pill 4 times a day and generic Sinemet CR 50-200 mg strength 1 pill 4 times a day.  He has been on ProAmatine for low blood pressure values and takes donepezil once  daily for his memory loss.  His symptoms are quite stable.  We will continue to monitor and he is advised to continue with his current medication regimen and therapy as well as additional care as needed.  We talked about the importance of fall prevention.  He is reminded not to get up without help.  He is advised to follow-up in this clinic to see one of our nurse practitioners in 6 months, sooner if needed.  I answered all the questions today and the patient and his daughter were in agreement.  I spent 40 minutes in total face-to-face time and in reviewing records during pre-charting, more than 50% of which was spent in counseling and coordination of care, reviewing test results, reviewing medications and treatment regimen and/or in discussing or reviewing the diagnosis of PD, the prognosis and treatment options. Pertinent laboratory and imaging test results that were available during this visit with  the patient were reviewed by me and considered in my medical decision making (see chart for details).

## 2021-05-01 NOTE — Patient Instructions (Signed)
It was nice to meet you both today.  I am glad to hear that you are feeling stable.  Nevertheless, you are at risk for falls.  Please do not get up and walk without assistance. As discussed, we will maintain your medication regimen for Parkinson's disease and your memory medication at the current doses and timings.  Please follow-up to see Butler Denmark, NP in about 6 months.

## 2021-05-02 DIAGNOSIS — F428 Other obsessive-compulsive disorder: Secondary | ICD-10-CM | POA: Diagnosis not present

## 2021-05-02 DIAGNOSIS — R296 Repeated falls: Secondary | ICD-10-CM | POA: Diagnosis not present

## 2021-05-02 DIAGNOSIS — R293 Abnormal posture: Secondary | ICD-10-CM | POA: Diagnosis not present

## 2021-05-02 DIAGNOSIS — M6389 Disorders of muscle in diseases classified elsewhere, multiple sites: Secondary | ICD-10-CM | POA: Diagnosis not present

## 2021-05-02 DIAGNOSIS — R41842 Visuospatial deficit: Secondary | ICD-10-CM | POA: Diagnosis not present

## 2021-05-02 DIAGNOSIS — R2689 Other abnormalities of gait and mobility: Secondary | ICD-10-CM | POA: Diagnosis not present

## 2021-05-02 DIAGNOSIS — G20 Parkinson's disease: Secondary | ICD-10-CM | POA: Diagnosis not present

## 2021-05-08 ENCOUNTER — Encounter: Payer: Self-pay | Admitting: Orthopedic Surgery

## 2021-05-08 ENCOUNTER — Non-Acute Institutional Stay (SKILLED_NURSING_FACILITY): Payer: PPO | Admitting: Orthopedic Surgery

## 2021-05-08 DIAGNOSIS — F429 Obsessive-compulsive disorder, unspecified: Secondary | ICD-10-CM | POA: Diagnosis not present

## 2021-05-08 DIAGNOSIS — F0280 Dementia in other diseases classified elsewhere without behavioral disturbance: Secondary | ICD-10-CM

## 2021-05-08 DIAGNOSIS — I2510 Atherosclerotic heart disease of native coronary artery without angina pectoris: Secondary | ICD-10-CM | POA: Diagnosis not present

## 2021-05-08 DIAGNOSIS — K5000 Crohn's disease of small intestine without complications: Secondary | ICD-10-CM | POA: Diagnosis not present

## 2021-05-08 DIAGNOSIS — M25511 Pain in right shoulder: Secondary | ICD-10-CM | POA: Diagnosis not present

## 2021-05-08 DIAGNOSIS — G20 Parkinson's disease: Secondary | ICD-10-CM | POA: Diagnosis not present

## 2021-05-08 DIAGNOSIS — J310 Chronic rhinitis: Secondary | ICD-10-CM | POA: Diagnosis not present

## 2021-05-08 NOTE — Progress Notes (Signed)
Location:  [facility] Room Number: 854-O Place of Service:  SNF [phone]) Provider:  Windell Moulding, AGNP-C  [name], MD  Patient Care Team: [name], MD as PCP - General (Internal Medicine) [name], [name], MD as PCP - Cardiology (Cardiology) [name], [name], MD as Consulting Physician (Cardiology) [name], MD as Attending Physician (Gastroenterology) [name], MD (Inactive) as Consulting Physician (Neurology) Community, [facility] ([place]) Royal Hawthorn, NP as Nurse Practitioner (Nurse Practitioner)  Extended Emergency Contact Information Primary Emergency Contact: [name] Address: [address] [place] [place]          [place], [place] [address] [name] of [place] Phone: [phone] Mobile Phone: [phone] Relation: Spouse  Code Status:  DNR Goals of care: Advanced Directive information Advanced Directives 03/22/2021  Does Patient Have a Medical Advance Directive? Yes  Type of Paramedic of [place];Living will;Out of facility DNR (pink MOST or yellow form)  Does patient want to make changes to medical advance directive? No - Patient declined  Copy of [place] in Chart? Yes - validated most recent copy scanned in chart (See row information)  Pre-existing out of facility DNR order (yellow form or pink MOST form) -     Chief Complaint  Patient presents with   Medical Management of Chronic Issues    HPI:  Pt is a 88 y.o. male seen today for medical management of chronic diseases.    He currently resides on the skilled nursing unit at [facility] 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   Parkinson disease- followed by neuro, diagnosed a few years ago, continues to have some stiffness and issues with fine motor  control L>R, remains on Sinemet IR and CR Dementia due to Parkinson's disease without behavioral disturbance - MMSE 28/30, no recent behavioral outbursts, remains on Aricept Obsessive-compulsive disorder- behaviors include washing hands/bathing/changing clothes, not recently observed per nursing, followed by Dr. Casimiro Needle, remains on Paxil Chronic rhinitis- started on Claritin last month, reports some improved symptoms, also on saline nasal spray Crohn's disease of small intestine without complication - no recent flares with diarrhea or abdominal pain, remains on Cholestyramine and probiotic Hypotension- remains on midodrine Coronary artery disease- history of stent and ischemic cardiomyopathy, remains on asa and lipitor Acute pain of right shoulder- fall 04/27/2021, pain to right shoulder has improved since incident, able to move right arm without issues, he has been given tylenol for pain, asking for Voltaren gel today  Recent fall 01/20- no apparent injury except right shoulder soreness. Ambulating with wheelchair more. Red license with [employer]- must use with assistance only.   Recent blood pressures:  01/31- 133/81  01/29- 18/73  01/28- 122/87  Recent weights:  01/23- 166.6 lbs  01/01- 164.6 lbs  12/01- 169.8 lbs        Past Medical History:  Diagnosis Date   Cellulitis    Chronic diastolic CHF (congestive heart failure) ([place])    a. EF initially 35-40% after MI 1/09; b. echo 7/08: EF 60%;  c. 11/2014 Echo: EF 65-70%, Gr 1 DD, mild MR.   Coronary artery disease    a. s/p anterior STEMI 04/2007 with BMS-> LAD;  b. Cath 12/2011 patent stent;  c. low risk nuc 04/2014.   Crohn's disease ([place])    Diaphragmatic hernia without mention of obstruction or gangrene    Diverticulosis of  colon (without mention of hemorrhage)    Fatty liver    a. on ultrasound of 10/2009   Flatulence, eructation, and gas pain    Fungal infection    Gait disorder    GERD (gastroesophageal reflux disease)    HOH (hard  of hearing)    Hearing aids   Hyperlipidemia    Hypertension    Ischemic cardiomyopathy    a. EF initially 35-40% after MI 1/09; b. echo 7/08: EF 60%;  c. 11/2014 Echo: EF 65-70%, Gr 1 DD, mild MR.   Melanoma ([place])    Memory disorder    Orthostatic hypotension    Osteoporosis    Other chronic nonalcoholic liver disease    Other esophagitis    Parkinson's disease ([place])    RBBB 09/02/2013   Regional enteritis of small intestine (HCC)    Renal calculi    Restless leg syndrome 03/22/2015   Ulcerative (chronic) ileocolitis (HCC)    Urinary incontinence    Past Surgical History:  Procedure Laterality Date   APPENDECTOMY     BACK SURGERY     L3, L4, L5   CARDIAC CATHETERIZATION  09/25/06   EF 35-40% but more recently 60%   CATARACT EXTRACTION     Bilateral   CHOLECYSTECTOMY     Coronary artery stent placement     Melanoma resection     Partial bowel resection     TONSILLECTOMY      No Known Allergies  Outpatient Encounter Medications as of 05/08/2021  Medication Sig   acetaminophen (TYLENOL) 325 MG tablet Take 650 mg by mouth every 6 (six) hours as needed.   aspirin EC 81 MG tablet Take 81 mg by mouth daily.   atorvastatin (LIPITOR) 40 MG tablet Take 40 mg by mouth daily.   calcium carbonate (OS-CAL) 600 MG TABS tablet Take 600 mg by mouth daily with breakfast.   carbidopa-levodopa (SINEMET CR) 50-200 MG tablet Take 1 tablet by mouth 4 (four) times daily.   carbidopa-levodopa (SINEMET IR) 25-100 MG tablet Take 1 tablet by mouth 4 (four) times daily.   Cholecalciferol (VITAMIN D3) 50 MCG (2000 UT) TABS Take 1 tablet by mouth daily.   cholestyramine (QUESTRAN) 4 g packet TAKE 1 PACKET BY MOUTH TWICE DAILY WITH A MEAL   diclofenac Sodium (VOLTAREN) 1 % GEL Apply topically 4 (four) times daily as needed (Apply to the affected right ankle).   donepezil (ARICEPT) 10 MG tablet TAKE ONE TABLET AT BEDTIME   ipratropium (ATROVENT) 0.03 % nasal spray Place 2 sprays into both nostrils 3  (three) times daily.   lidocaine (XYLOCAINE) 2 % solution Use as directed 15 mLs in the mouth or throat as needed for mouth pain.   loratadine (CLARITIN) 10 MG tablet Take 1 tablet (10 mg total) by mouth daily.   midodrine (PROAMATINE) 5 MG tablet Take 1 tablet (5 mg total) by mouth 2 (two) times daily with a meal.   multivitamin-iron-minerals-folic acid (CENTRUM) chewable tablet Chew 1 tablet by mouth daily.   nitroGLYCERIN (NITROSTAT) 0.4 MG SL tablet Place 1 tablet (0.4 mg total) under the tongue every 5 (five) minutes as needed for chest pain. PT OVERDUE FOR OV PLEASE CALL FOR APPT   pantoprazole (PROTONIX) 40 MG tablet Take 40 mg by mouth daily.   PARoxetine (PAXIL) 20 MG tablet Take 20 mg by mouth 2 (two) times daily.   potassium chloride SA (K-DUR,KLOR-CON) 20 MEQ tablet Take 40 mEq by mouth daily.   Probiotic Product (ALIGN) 4  MG CAPS Take 1 capsule by mouth daily.    sodium chloride (OCEAN) 0.65 % SOLN nasal spray Place 2 sprays into both nostrils daily as needed for congestion.    torsemide (DEMADEX) 20 MG tablet Take 1 tablet (20 mg total) by mouth daily.   vitamin B-12 (CYANOCOBALAMIN) 1000 MCG tablet Take 1,000 mcg by mouth daily.   No facility-administered encounter medications on file as of 05/08/2021.    Review of Systems  Constitutional:  Negative for activity change, appetite change, chills, fatigue and fever.  HENT:  Positive for rhinorrhea and sneezing. Negative for congestion, ear pain, sinus pressure, sinus pain and trouble swallowing.   Eyes:  Negative for visual disturbance.  Respiratory:  Negative for cough, shortness of breath and wheezing.   Cardiovascular:  Negative for chest pain and leg swelling.  Gastrointestinal:  Positive for constipation. Negative for abdominal distention, abdominal pain, blood in stool, diarrhea, nausea and vomiting.  Genitourinary:  Positive for frequency. Negative for dysuria and hematuria.  Musculoskeletal:  Positive for arthralgias and  gait problem.       Right shoulder pain  Skin:  Negative for wound.  Neurological:  Positive for weakness. Negative for dizziness, tremors and headaches.  Psychiatric/Behavioral:  Positive for confusion. Negative for dysphoric mood and sleep disturbance. The patient is not nervous/anxious.    Immunization History  Administered Date(s) Administered   Influenza, High Dose Seasonal PF 01/21/2019, 01/10/2021   Influenza-Unspecified 12/06/2014, 11/27/2017, 02/01/2020   Moderna Covid-19 Vaccine Bivalent Booster 38yr & up 01/17/2021   Moderna SARS-COV2 Booster Vaccination 11/28/2020   Moderna Sars-Covid-2 Vaccination 04/13/2019, 05/11/2019, 02/18/2020   Pneumococcal Conjugate-13 05/21/2018   Pneumococcal Polysaccharide-23 05/21/2005   Tdap 06/03/2006, 10/22/2018   Zoster Recombinat (Shingrix) 10/22/2018, 12/24/2018   Pertinent  Health Maintenance Due  Topic Date Due   URINE MICROALBUMIN  Never done   INFLUENZA VACCINE  Completed   Fall Risk 05/12/2018 10/22/2018 07/20/2019 11/11/2019 12/07/2020  Falls in the past year? 1 1 - 1 1  Was there an injury with Fall? 0 1 - 1 1  Fall Risk Category Calculator 2 3 - 2 3  Fall Risk Category Moderate High - Moderate High  Patient Fall Risk Level Moderate fall risk - - High fall risk High fall risk  Patient at Risk for Falls Due to Impaired mobility;History of fall(s);Impaired balance/gait;Mental status change;Medication side effect History of fall(s);Impaired mobility History of fall(s);Impaired balance/gait;Impaired mobility;Mental status change;Medication side effect History of fall(s);Impaired balance/gait Impaired balance/gait;History of fall(s)  Fall risk Follow up Falls evaluation completed;Falls prevention discussed;Education provided Falls evaluation completed;Falls prevention discussed Education provided;Falls prevention discussed;Falls evaluation completed Falls evaluation completed Falls evaluation completed;Follow up appointment  Fall risk Follow up  - - at [facility]    Vitals:   05/08/21 1427  BP: 133/81  Pulse: 82  Resp: 18  Temp: 97.6 F (36.4 C)  SpO2: 98%  Weight: 166 lb 9.6 oz (75.6 kg)  Height: 5' 9"  (1.753 m)   Body mass index is 24.6 kg/m. Physical Exam Vitals reviewed.  Constitutional:      General: He is not in acute distress. HENT:     Head: Normocephalic.     Right Ear: There is no impacted cerumen.     Left Ear: There is no impacted cerumen.     Nose: No congestion or rhinorrhea.     Mouth/Throat:     Mouth: Mucous membranes are moist.  Eyes:     General:  Right eye: No discharge.        Left eye: No discharge.  Neck:     Vascular: No carotid bruit.  Cardiovascular:     Rate and Rhythm: Normal rate and regular rhythm.     Pulses: Normal pulses.     Heart sounds: Normal heart sounds. No murmur heard. Pulmonary:     Effort: Pulmonary effort is normal. No respiratory distress.     Breath sounds: Normal breath sounds. No wheezing.  Abdominal:     General: Bowel sounds are normal. There is no distension.     Palpations: Abdomen is soft.     Tenderness: There is no abdominal tenderness.  Musculoskeletal:     Left shoulder: No swelling, deformity or tenderness. Normal range of motion.     Cervical back: Normal range of motion.     Right lower leg: No edema.     Left lower leg: No edema.  Lymphadenopathy:     Cervical: No cervical adenopathy.  Skin:    General: Skin is warm and dry.     Capillary Refill: Capillary refill takes less than 2 seconds.  Neurological:     General: No focal deficit present.     Mental Status: He is alert and oriented to person, place, and time.     Motor: Weakness present.     Gait: Gait abnormal.     Comments: wheelchair  Psychiatric:        Mood and Affect: Mood normal.        Behavior: Behavior normal.    Labs reviewed: Recent Labs    07/13/20 0000 02/12/21 0000  NA 139 142  K 3.6 3.7  CL 100 101  CO2 24* 27*   BUN 20 19  CREATININE 1.1 1.1  CALCIUM 9.1 8.9   Recent Labs    07/13/20 0000  AST 33  ALT 8*  ALKPHOS 83  ALBUMIN 4.5   Recent Labs    07/13/20 0000  WBC 7.4  HGB 12.5*  HCT 37*  PLT 232   Lab Results  Component Value Date   TSH 3.60 10/16/2020   No results found for: HGBA1C Lab Results  Component Value Date   CHOL 125 07/13/2020   HDL 60 07/13/2020   LDLCALC 45 07/13/2020   TRIG 100 07/13/2020    Significant Diagnostic Results in last 30 days:  No results found.  Assessment/Plan 1. Parkinson disease ([place]) - followed by neurology- seen 01/24 - cont Sinemet IR and CR - cont skilled nursing care  2. Dementia due to Parkinson's disease without behavioral disturbance ([place]) - MMSE 28/30 - very pleasant - no recent behavioral outbursts - cont Aricept  3. Obsessive-compulsive disorder, unspecified type - no recent behaviors - followed by Dr. Casimiro Needle - cont Paxil  4. Chronic rhinitis - improved with Claritin and saline nasal spray  5. Crohn's disease of small intestine without complication ([place]) - no recent flares - cont Cholestyramine and probiotic  6. Coronary artery disease involving native coronary artery of native heart without angina pectoris - history of stent and ischemic cardiomyopathy - cont asa and lipitor  7. Acute pain of right shoulder - due to fall 01/20 - FROM, exam unremarkable - cont tylenol - start voltaren gel 1%- apply to right shoulder bid x 7 days    Family/ staff Communication: plan discussed with patient and nurse  Labs/tests ordered:  cbc/diff, bmp, lipid panel

## 2021-05-08 NOTE — Progress Notes (Signed)
Subjective:     [name], male   DOB: 07-05-1928, 88 y.o.   MRN: [id number]  HPI  Pt seen [facility] at [facility] for routine visit. He is in good spirits and states that he is doing well. Pt recently had a fall on 04/27/21 and has since had intermittent right shoulder pain, denies pain elsewhere. Pt has been taking tylenol for the pain and it has helped. He does not want any other oral medications but is willing to try a topical. Pt does state that he is congested and that one of his toe nails is very long but has no other concerns.    Review of Systems  Constitutional:  Negative for activity change, appetite change, fatigue and fever.  HENT:  Positive for congestion. Negative for ear pain and sore throat.   Respiratory:  Negative for cough, shortness of breath and wheezing.   Cardiovascular:  Negative for chest pain and leg swelling.  Gastrointestinal:  Negative for abdominal distention, abdominal pain and constipation.  Endocrine: Negative for polyuria.  Genitourinary:  Positive for frequency. Negative for difficulty urinating and dysuria.  Musculoskeletal:        Right shoulder pain.  Allergic/Immunologic: Positive for environmental allergies.  Neurological:  Positive for weakness. Negative for dizziness and light-headedness.  Psychiatric/Behavioral:  Positive for confusion. Negative for agitation, dysphoric mood and sleep disturbance.       Objective:   Physical Exam Constitutional:      General: He is not in acute distress.    Appearance: Normal appearance. He is not ill-appearing.  HENT:     Right Ear: Tympanic membrane, ear canal and external ear normal.     Left Ear: Tympanic membrane, ear canal and external ear normal.     Nose: Congestion present.  Eyes:     Conjunctiva/sclera: Conjunctivae normal.  Cardiovascular:     Rate and Rhythm: Normal rate and regular rhythm.     Pulses: Normal pulses.     Heart sounds: Normal heart sounds.  Pulmonary:      Effort: Pulmonary effort is normal.     Breath sounds: Normal breath sounds. No wheezing.  Abdominal:     General: Bowel sounds are normal.     Palpations: Abdomen is soft.     Tenderness: There is no abdominal tenderness.  Musculoskeletal:     Right shoulder: No swelling or deformity. Normal range of motion.  Neurological:     Mental Status: He is alert. Mental status is at baseline.     Gait: Gait abnormal.     Comments: Ambulates with walker  Psychiatric:        Mood and Affect: Mood normal.       Assessment/Plan:      Parkinson disease (HCC) Dementia due to Parkinson's disease without behavioral disturbance ([place]) -Pleasant mood and affect with no recent behavioral outbursts -Recent fall 04/27/2021 -Ambulates with wheelchair -Followed by Neuro -Continue Carbidopa-levodopa and Aricept  Mixed obsessional thoughts and acts Obsessive-compulsive disorder -Followed by Dr. Casimiro Needle -Behaviors include washing hands, bathing, and changing clothes -Continue Paxil  Chronic rhinitis -Reports congestion today, continue Flonase, continue Claritin, consider changing Claritin to another antihistamine if not improved within the next few weeks.   Crohn's disease of small intestine without complication ([place]) - No reports of GI upset or pain, not having diarrhea, no recent flares - Continue Cholestyramine and probiotic  Hypotension -BP stable -Continue midodrine   Coronary artery disease  - Continue aspirin and Lipitor  Acute pain  of right shoulder - Recent fall 04/27/2021 - Pain is intermittent and moderately well controlled with tylenol - Patient able to raise arm above head, as well as internal and externally rotate shoulder without problem - Start Voltaren gel BID x 1 week -Consider X-ray if pain worsens or does not improve.

## 2021-05-09 DIAGNOSIS — R2689 Other abnormalities of gait and mobility: Secondary | ICD-10-CM | POA: Diagnosis not present

## 2021-05-09 DIAGNOSIS — R293 Abnormal posture: Secondary | ICD-10-CM | POA: Diagnosis not present

## 2021-05-09 DIAGNOSIS — R41842 Visuospatial deficit: Secondary | ICD-10-CM | POA: Diagnosis not present

## 2021-05-09 DIAGNOSIS — F428 Other obsessive-compulsive disorder: Secondary | ICD-10-CM | POA: Diagnosis not present

## 2021-05-09 DIAGNOSIS — G20 Parkinson's disease: Secondary | ICD-10-CM | POA: Diagnosis not present

## 2021-05-09 DIAGNOSIS — M6389 Disorders of muscle in diseases classified elsewhere, multiple sites: Secondary | ICD-10-CM | POA: Diagnosis not present

## 2021-05-09 DIAGNOSIS — R296 Repeated falls: Secondary | ICD-10-CM | POA: Diagnosis not present

## 2021-05-10 DIAGNOSIS — I10 Essential (primary) hypertension: Secondary | ICD-10-CM | POA: Diagnosis not present

## 2021-05-10 LAB — BASIC METABOLIC PANEL
BUN: 21 (ref 4–21)
CO2: 22 (ref 13–22)
Chloride: 100 (ref 99–108)
Creatinine: 0.9 (ref 0.6–1.3)
Glucose: 99
Potassium: 3.7 mEq/L (ref 3.5–5.1)
Sodium: 140 (ref 137–147)

## 2021-05-10 LAB — CBC AND DIFFERENTIAL
HCT: 38 — AB (ref 41–53)
Hemoglobin: 12.4 — AB (ref 13.5–17.5)
Platelets: 251 10*3/uL (ref 150–400)
WBC: 7.3

## 2021-05-10 LAB — COMPREHENSIVE METABOLIC PANEL: Calcium: 8.4 — AB (ref 8.7–10.7)

## 2021-05-10 LAB — LIPID PANEL
Cholesterol: 119 (ref 0–200)
HDL: 56 (ref 35–70)
LDL Cholesterol: 48
LDl/HDL Ratio: 2.1
Triglycerides: 76 (ref 40–160)

## 2021-05-10 LAB — CBC: RBC: 4.01 (ref 3.87–5.11)

## 2021-05-18 DIAGNOSIS — M6389 Disorders of muscle in diseases classified elsewhere, multiple sites: Secondary | ICD-10-CM | POA: Diagnosis not present

## 2021-05-18 DIAGNOSIS — R2689 Other abnormalities of gait and mobility: Secondary | ICD-10-CM | POA: Diagnosis not present

## 2021-05-18 DIAGNOSIS — R296 Repeated falls: Secondary | ICD-10-CM | POA: Diagnosis not present

## 2021-05-18 DIAGNOSIS — R41842 Visuospatial deficit: Secondary | ICD-10-CM | POA: Diagnosis not present

## 2021-05-18 DIAGNOSIS — R293 Abnormal posture: Secondary | ICD-10-CM | POA: Diagnosis not present

## 2021-05-18 DIAGNOSIS — G20 Parkinson's disease: Secondary | ICD-10-CM | POA: Diagnosis not present

## 2021-05-18 DIAGNOSIS — F428 Other obsessive-compulsive disorder: Secondary | ICD-10-CM | POA: Diagnosis not present

## 2021-05-24 DIAGNOSIS — R41842 Visuospatial deficit: Secondary | ICD-10-CM | POA: Diagnosis not present

## 2021-05-24 DIAGNOSIS — R293 Abnormal posture: Secondary | ICD-10-CM | POA: Diagnosis not present

## 2021-05-24 DIAGNOSIS — R2689 Other abnormalities of gait and mobility: Secondary | ICD-10-CM | POA: Diagnosis not present

## 2021-05-24 DIAGNOSIS — G20 Parkinson's disease: Secondary | ICD-10-CM | POA: Diagnosis not present

## 2021-05-24 DIAGNOSIS — R296 Repeated falls: Secondary | ICD-10-CM | POA: Diagnosis not present

## 2021-05-24 DIAGNOSIS — M6389 Disorders of muscle in diseases classified elsewhere, multiple sites: Secondary | ICD-10-CM | POA: Diagnosis not present

## 2021-05-24 DIAGNOSIS — F428 Other obsessive-compulsive disorder: Secondary | ICD-10-CM | POA: Diagnosis not present

## 2021-05-29 DIAGNOSIS — G20 Parkinson's disease: Secondary | ICD-10-CM | POA: Diagnosis not present

## 2021-05-29 DIAGNOSIS — R296 Repeated falls: Secondary | ICD-10-CM | POA: Diagnosis not present

## 2021-05-29 DIAGNOSIS — F428 Other obsessive-compulsive disorder: Secondary | ICD-10-CM | POA: Diagnosis not present

## 2021-05-29 DIAGNOSIS — R41842 Visuospatial deficit: Secondary | ICD-10-CM | POA: Diagnosis not present

## 2021-05-29 DIAGNOSIS — M6389 Disorders of muscle in diseases classified elsewhere, multiple sites: Secondary | ICD-10-CM | POA: Diagnosis not present

## 2021-05-29 DIAGNOSIS — R293 Abnormal posture: Secondary | ICD-10-CM | POA: Diagnosis not present

## 2021-05-29 DIAGNOSIS — R2689 Other abnormalities of gait and mobility: Secondary | ICD-10-CM | POA: Diagnosis not present

## 2021-05-29 NOTE — Progress Notes (Unsigned)
Cardiology Office Note Date:  05/29/2021  Patient ID:  [name] [name], [name] [name] [birth year], MRN [id number] PCP:  [name], MD  Cardiologist:  Dr. [name]   Chief Complaint: CAD  History of Present Illness: [name] [name] is a 88 y.o. male with history of CAD (s/p anterior STEMI 04/2007 with BMS-> LAD, patent by cath in 12/2011, low risk nuc 04/2014), chronic dCHF, HTN, HLD, Crohn's disease, GERD, Parkinson's disease, cellulitis, orthostatic hypotension requiring midodrine, GERD, RBBB, fatty liver, gait disorder, chronic lower extremity edema who presents for follow up.   He was seen in the ED in May 2019 with left leg cellulitis. In September 2019 he was admitted with right leg cellulitis having failed outpatient antibiotics. Dopplers negative for DVT.   He is followed by Dr Jannifer Franklin for his Parkinson's disease and memory loss.   He is seen with his wife today. He is  in skilled nursing at [facility]. He is doing really well. He is very active in things he can do in his wheelchair. he still gets PT twice a week. No dizziness or syncope.   Denies any chest pain, dyspnea,  fever.  He does note some mild LE edema. Wears support hose. Doesn't always eat the right things.    Past Medical History:  Diagnosis Date   Cellulitis    Chronic diastolic CHF (congestive heart failure) ([place])    a. EF initially 35-40% after MI 1/09; b. echo 7/08: EF 60%;  c. 11/2014 Echo: EF 65-70%, Gr 1 DD, mild MR.   Coronary artery disease    a. s/p anterior STEMI 04/2007 with BMS-> LAD;  b. Cath 12/2011 patent stent;  c. low risk nuc 04/2014.   Crohn's disease ([place])    Diaphragmatic hernia without mention of obstruction or gangrene    Diverticulosis of colon (without mention of hemorrhage)    Fatty liver    a. on ultrasound of 10/2009   Flatulence, eructation, and gas pain    Fungal infection    Gait disorder    GERD (gastroesophageal reflux disease)    HOH (hard of hearing)    Hearing aids   Hyperlipidemia     Hypertension    Ischemic cardiomyopathy    a. EF initially 35-40% after MI 1/09; b. echo 7/08: EF 60%;  c. 11/2014 Echo: EF 65-70%, Gr 1 DD, mild MR.   Melanoma ([place])    Memory disorder    Orthostatic hypotension    Osteoporosis    Other chronic nonalcoholic liver disease    Other esophagitis    Parkinson's disease ([place])    RBBB 09/02/2013   Regional enteritis of small intestine (HCC)    Renal calculi    Restless leg syndrome 03/22/2015   Ulcerative (chronic) ileocolitis (HCC)    Urinary incontinence     Past Surgical History:  Procedure Laterality Date   APPENDECTOMY     BACK SURGERY     L3, L4, L5   CARDIAC CATHETERIZATION  09/25/06   EF 35-40% but more recently 60%   CATARACT EXTRACTION     Bilateral   CHOLECYSTECTOMY     Coronary artery stent placement     Melanoma resection     Partial bowel resection     TONSILLECTOMY      Current Outpatient Medications  Medication Sig Dispense Refill   acetaminophen (TYLENOL) 325 MG tablet Take 650 mg by mouth every 6 (six) hours as needed.     aspirin EC 81 MG tablet Take 81  mg by mouth daily.     atorvastatin (LIPITOR) 40 MG tablet Take 40 mg by mouth daily.     calcium carbonate (OS-CAL) 600 MG TABS tablet Take 600 mg by mouth daily with breakfast.     carbidopa-levodopa (SINEMET CR) 50-200 MG tablet Take 1 tablet by mouth 4 (four) times daily. 120 tablet 5   carbidopa-levodopa (SINEMET IR) 25-100 MG tablet Take 1 tablet by mouth 4 (four) times daily.     Cholecalciferol (VITAMIN D3) 50 MCG (2000 UT) TABS Take 1 tablet by mouth daily.     cholestyramine (QUESTRAN) 4 g packet TAKE 1 PACKET BY MOUTH TWICE DAILY WITH A MEAL 60 each 11   diclofenac Sodium (VOLTAREN) 1 % GEL Apply topically 4 (four) times daily as needed (Apply to the affected right ankle).     donepezil (ARICEPT) 10 MG tablet TAKE ONE TABLET AT BEDTIME 90 tablet 0   ipratropium (ATROVENT) 0.03 % nasal spray Place 2 sprays into both nostrils 3 (three) times daily.      lidocaine (XYLOCAINE) 2 % solution Use as directed 15 mLs in the mouth or throat as needed for mouth pain.     loratadine (CLARITIN) 10 MG tablet Take 1 tablet (10 mg total) by mouth daily. 30 tablet 11   midodrine (PROAMATINE) 5 MG tablet Take 1 tablet (5 mg total) by mouth 2 (two) times daily with a meal. 60 tablet 9   multivitamin-iron-minerals-folic acid (CENTRUM) chewable tablet Chew 1 tablet by mouth daily.     nitroGLYCERIN (NITROSTAT) 0.4 MG SL tablet Place 1 tablet (0.4 mg total) under the tongue every 5 (five) minutes as needed for chest pain. PT OVERDUE FOR OV PLEASE CALL FOR APPT 25 tablet 0   pantoprazole (PROTONIX) 40 MG tablet Take 40 mg by mouth daily.     PARoxetine (PAXIL) 20 MG tablet Take 20 mg by mouth 2 (two) times daily.     potassium chloride SA (K-DUR,KLOR-CON) 20 MEQ tablet Take 40 mEq by mouth daily.     Probiotic Product (ALIGN) 4 MG CAPS Take 1 capsule by mouth daily.      sodium chloride (OCEAN) 0.65 % SOLN nasal spray Place 2 sprays into both nostrils daily as needed for congestion.      torsemide (DEMADEX) 20 MG tablet Take 1 tablet (20 mg total) by mouth daily. 180 tablet 3   vitamin B-12 (CYANOCOBALAMIN) 1000 MCG tablet Take 1,000 mcg by mouth daily.     No current facility-administered medications for this visit.    Allergies:   Patient has no known allergies.   Social History:  The patient  reports that he has never smoked. He has never used smokeless tobacco. He reports current alcohol use of about 2.0 - 3.0 standard drinks per week. He reports that he does not use drugs.   Family History:  The patient's family history includes Colon cancer in his brother; Coronary artery disease in an other family member; Emphysema in his father; Heart attack in his mother; Heart disease in his brother and brother; Heart failure in his mother; Hypertension in his mother.  ROS:  Please see the history of present illness.   All other systems are reviewed and otherwise  negative.   PHYSICAL EXAM:  VS:  There were no vitals taken for this visit. BMI: There is no height or weight on file to calculate BMI. GENERAL:  Well appearing, elderly WM in NAD, seen in a wheelchair.  HEENT:  PERRL, EOMI, sclera are  clear. Oropharynx is clear. NECK:  No jugular venous distention, carotid upstroke brisk and symmetric, no bruits, no thyromegaly or adenopathy LUNGS:  Clear to auscultation bilaterally CHEST:  Unremarkable HEART:  RRR,  PMI not displaced or sustained,S1 and S2 within normal limits, no S3, no S4: no clicks, no rubs, no murmurs ABD:  Soft, nontender. Distended. BS +, no masses or bruits. No hepatomegaly, no splenomegaly EXT:  2 + pulses throughout, 1+ RLE  Edema, trace on left., no cyanosis no clubbing SKIN:  Warm and dry.  No rashes NEURO:  Alert and oriented x 3. Cranial nerves II through XII intact. PSYCH:  Cognitively intact  EKG:  Is not done today.     Recent Labs: 07/13/2020: ALT 8; Hemoglobin 12.5; Platelets 232 10/16/2020: TSH 3.60 02/12/2021: BUN 19; Creatinine 1.1; Potassium 3.7; Sodium 142  05/10/2021: Cholesterol 119; HDL 56; LDL Cholesterol 48; Triglycerides 76   CrCl cannot be calculated (Patient's most recent lab result is older than the maximum 21 days allowed.).    Wt Readings from Last 3 Encounters:  05/08/21 166 lb 9.6 oz (75.6 kg)  03/22/21 162 lb 14.4 oz (73.9 kg)  03/08/21 169 lb 12.8 oz (77 kg)     Other studies reviewed: Additional studies/records reviewed today include:  Echo 11/14/15: Study Conclusions   - Left ventricle: The cavity size was normal. There was mild focal   basal and mild concentric hypertrophy of the septum. Systolic   function was vigorous. The estimated ejection fraction was in the   range of 65% to 70%. Wall motion was normal; there were no   regional wall motion abnormalities. Doppler parameters are   consistent with abnormal left ventricular relaxation (grade 1   diastolic dysfunction). - Mitral valve:  There was mild regurgitation.   Impressions:   - Compared to the prior study, there has been no significant   interval change.  ASSESSMENT AND PLAN:  Chronic diastolic CHF -well compensated. He does have mild edema R>L.  Recommend continuing torsemide 20 mg daily. Restrict salt intake more. Continue compression hose. Keep feet elevated when possible. Will update labs including CBC, CMET, TSH and lipids.  History of essential HTN with orthostatic hypotension. BP well controlled on no antihypertensive therapy. On midodrine bid for orthostasis.  CAD s/p anterior MI 2009 with BMS to LAD. Cath 2013 showed patent stent. Normal Myoview Jan 2016.  Asymptomatic. 4.   Parkinson's disease- per Neurology   Disposition: F/u in 6 months  Current medicines are reviewed at length with the patient today.  The patient did not have any concerns regarding medicines.  Signed, Lesleigh Hughson [name] MD, [facility]    05/29/2021 7:38 AM     [facility]

## 2021-05-30 DIAGNOSIS — M6389 Disorders of muscle in diseases classified elsewhere, multiple sites: Secondary | ICD-10-CM | POA: Diagnosis not present

## 2021-05-30 DIAGNOSIS — R296 Repeated falls: Secondary | ICD-10-CM | POA: Diagnosis not present

## 2021-05-30 DIAGNOSIS — G20 Parkinson's disease: Secondary | ICD-10-CM | POA: Diagnosis not present

## 2021-05-30 DIAGNOSIS — R293 Abnormal posture: Secondary | ICD-10-CM | POA: Diagnosis not present

## 2021-05-30 DIAGNOSIS — R41842 Visuospatial deficit: Secondary | ICD-10-CM | POA: Diagnosis not present

## 2021-05-30 DIAGNOSIS — R2689 Other abnormalities of gait and mobility: Secondary | ICD-10-CM | POA: Diagnosis not present

## 2021-05-30 DIAGNOSIS — F428 Other obsessive-compulsive disorder: Secondary | ICD-10-CM | POA: Diagnosis not present

## 2021-06-04 ENCOUNTER — Non-Acute Institutional Stay (SKILLED_NURSING_FACILITY): Payer: PPO | Admitting: Adult Health

## 2021-06-04 ENCOUNTER — Encounter: Payer: Self-pay | Admitting: Adult Health

## 2021-06-04 ENCOUNTER — Encounter: Payer: Self-pay | Admitting: Internal Medicine

## 2021-06-04 DIAGNOSIS — R29898 Other symptoms and signs involving the musculoskeletal system: Secondary | ICD-10-CM

## 2021-06-04 DIAGNOSIS — R5383 Other fatigue: Secondary | ICD-10-CM

## 2021-06-04 DIAGNOSIS — R531 Weakness: Secondary | ICD-10-CM | POA: Diagnosis not present

## 2021-06-04 LAB — BASIC METABOLIC PANEL
BUN: 18 (ref 4–21)
CO2: 31 — AB (ref 13–22)
Chloride: 99 (ref 99–108)
Creatinine: 1.1 (ref 0.6–1.3)
Glucose: 124
Potassium: 4.5 (ref 3.4–5.3)
Sodium: 141 (ref 137–147)

## 2021-06-04 LAB — CBC AND DIFFERENTIAL
HCT: 38 — AB (ref 41–53)
Hemoglobin: 12.9 — AB (ref 13.5–17.5)
Neutrophils Absolute: 4.4
Platelets: 245 (ref 150–399)
WBC: 7.4

## 2021-06-04 LAB — HEPATIC FUNCTION PANEL
ALT: 12 (ref 10–40)
AST: 26 (ref 14–40)
Alkaline Phosphatase: 71 (ref 25–125)
Bilirubin, Total: 1.2

## 2021-06-04 LAB — CBC: RBC: 4.1 (ref 3.87–5.11)

## 2021-06-04 LAB — COMPREHENSIVE METABOLIC PANEL
Albumin: 4.4 (ref 3.5–5.0)
Calcium: 8.4 — AB (ref 8.7–10.7)

## 2021-06-04 NOTE — Progress Notes (Signed)
Location:  Occupational psychologist of Service:  SNF (31) Provider:   Cindi Carbon, [place] [phone]   [name], MD  Patient Care Team: [name], MD as PCP - General (Internal Medicine) [name], [name], MD as PCP - Cardiology (Cardiology) [name], [name], MD as Consulting Physician (Cardiology) [name], MD as Attending Physician (Gastroenterology) [name], MD (Inactive) as Consulting Physician (Neurology) Community, [facility] ([place]) Royal Hawthorn, NP as Nurse Practitioner (Nurse Practitioner)  Extended Emergency Contact Information Primary Emergency Contact: [name] Address: [address] [facility] [address] [place] [place]          [place], [place] [address] [name] Phone: [phone] Mobile Phone: [phone] Relation: Spouse  Code Status:  DNR Goals of care: Advanced Directive information Advanced Directives 03/22/2021  Does Patient Have a Medical Advance Directive? Yes  Type of Paramedic of [place];Living will;Out of facility DNR (pink MOST or yellow form)  Does patient want to make changes to medical advance directive? No - Patient declined  Copy of [place] in Chart? Yes - validated most recent copy scanned in chart (See row information)  Pre-existing out of facility DNR order (yellow form or pink MOST form) -     Chief Complaint  Patient presents with   Acute Visit    weakness    HPI:  Pt is a 88 y.o. male seen today for an acute visit for weakness. The nurse reports he has been lethargic and weak since Sunday (1 day ago).  He required help with feeding and was not using his hands at first but now is using the right hand for feeding and brushing his teeth but is not using the left hand. Also has some left hand numbness. He is right hand dominant. He is not having any cough or congestion. No urinary symptoms.  Vitals are WNL. He has a hx of PD and follows with neurology. Has had falls and had pain to the right shoulder 1/31 which has improved. No dysarthria or facial droop. Able to swallow without issues.    Past Medical History:  Diagnosis Date   Cellulitis    Chronic diastolic CHF (congestive heart failure) ([place])    a. EF initially 35-40% after MI 1/09; b. echo 7/08: EF 60%;  c. 11/2014 Echo: EF 65-70%, Gr 1 DD, mild MR.   Coronary artery disease    a. s/p anterior STEMI 04/2007 with BMS-> LAD;  b. Cath 12/2011 patent stent;  c. low risk nuc 04/2014.   Crohn's disease ([place])    Diaphragmatic hernia without mention of obstruction or gangrene    Diverticulosis of colon (without mention of hemorrhage)    Fatty liver    a. on ultrasound of 10/2009   Flatulence, eructation, and gas pain    Fungal infection    Gait disorder    GERD (gastroesophageal reflux disease)    HOH (hard of hearing)    Hearing aids   Hyperlipidemia    Hypertension    Ischemic cardiomyopathy    a. EF initially 35-40% after MI 1/09; b. echo 7/08: EF 60%;  c. 11/2014 Echo: EF 65-70%, Gr 1 DD, mild MR.   Melanoma ([place])    Memory disorder    Orthostatic hypotension    Osteoporosis    Other chronic nonalcoholic liver disease    Other esophagitis    Parkinson's disease ([place])    RBBB 09/02/2013  Regional enteritis of small intestine (HCC)    Renal calculi    Restless leg syndrome 03/22/2015   Ulcerative (chronic) ileocolitis (HCC)    Urinary incontinence    Past Surgical History:  Procedure Laterality Date   APPENDECTOMY     BACK SURGERY     L3, L4, L5   CARDIAC CATHETERIZATION  09/25/06   EF 35-40% but more recently 60%   CATARACT EXTRACTION     Bilateral   CHOLECYSTECTOMY     Coronary artery stent placement     Melanoma resection     Partial bowel resection     TONSILLECTOMY      No Known Allergies  Outpatient Encounter Medications as of 06/04/2021  Medication Sig   acetaminophen (TYLENOL) 325 MG tablet Take  650 mg by mouth every 6 (six) hours as needed.   aspirin EC 81 MG tablet Take 81 mg by mouth daily.   atorvastatin (LIPITOR) 40 MG tablet Take 40 mg by mouth daily.   calcium carbonate (OS-CAL) 600 MG TABS tablet Take 600 mg by mouth daily with breakfast.   carbidopa-levodopa (SINEMET CR) 50-200 MG tablet Take 1 tablet by mouth 4 (four) times daily.   carbidopa-levodopa (SINEMET IR) 25-100 MG tablet Take 1 tablet by mouth 4 (four) times daily.   Cholecalciferol (VITAMIN D3) 50 MCG (2000 UT) TABS Take 1 tablet by mouth daily.   cholestyramine (QUESTRAN) 4 g packet TAKE 1 PACKET BY MOUTH TWICE DAILY WITH A MEAL   diclofenac Sodium (VOLTAREN) 1 % GEL Apply topically 4 (four) times daily as needed (Apply to the affected right ankle).   donepezil (ARICEPT) 10 MG tablet TAKE ONE TABLET AT BEDTIME   ipratropium (ATROVENT) 0.03 % nasal spray Place 2 sprays into both nostrils 3 (three) times daily.   lidocaine (XYLOCAINE) 2 % solution Use as directed 15 mLs in the mouth or throat as needed for mouth pain.   loratadine (CLARITIN) 10 MG tablet Take 1 tablet (10 mg total) by mouth daily.   midodrine (PROAMATINE) 5 MG tablet Take 1 tablet (5 mg total) by mouth 2 (two) times daily with a meal.   multivitamin-iron-minerals-folic acid (CENTRUM) chewable tablet Chew 1 tablet by mouth daily.   nitroGLYCERIN (NITROSTAT) 0.4 MG SL tablet Place 1 tablet (0.4 mg total) under the tongue every 5 (five) minutes as needed for chest pain. PT OVERDUE FOR OV PLEASE CALL FOR APPT   pantoprazole (PROTONIX) 40 MG tablet Take 40 mg by mouth daily.   PARoxetine (PAXIL) 20 MG tablet Take 20 mg by mouth 2 (two) times daily.   potassium chloride SA (K-DUR,KLOR-CON) 20 MEQ tablet Take 40 mEq by mouth daily.   Probiotic Product (ALIGN) 4 MG CAPS Take 1 capsule by mouth daily.    sodium chloride (OCEAN) 0.65 % SOLN nasal spray Place 2 sprays into both nostrils daily as needed for congestion.    torsemide (DEMADEX) 20 MG tablet Take 1  tablet (20 mg total) by mouth daily.   vitamin B-12 (CYANOCOBALAMIN) 1000 MCG tablet Take 1,000 mcg by mouth daily.   No facility-administered encounter medications on file as of 06/04/2021.    Review of Systems  Constitutional:  Positive for activity change and fatigue. Negative for appetite change, chills, diaphoresis, fever and unexpected weight change.  HENT:  Negative for sneezing, sore throat and trouble swallowing.   Respiratory:  Negative for cough, shortness of breath and wheezing.   Cardiovascular:  Negative for chest pain, palpitations and leg swelling.  Gastrointestinal:  Negative for abdominal distention, constipation and diarrhea.  Musculoskeletal:  Positive for gait problem.  Neurological:  Positive for weakness and numbness (left hand). Negative for dizziness, tremors, seizures, syncope, facial asymmetry, speech difficulty, light-headedness and headaches.   Immunization History  Administered Date(s) Administered   Influenza, High Dose Seasonal PF 01/21/2019, 01/10/2021   Influenza-Unspecified 12/06/2014, 11/27/2017, 02/01/2020   Moderna Covid-19 Vaccine Bivalent Booster 21yr & up 01/17/2021   Moderna SARS-COV2 Booster Vaccination 11/28/2020   Moderna Sars-Covid-2 Vaccination 04/13/2019, 05/11/2019, 02/18/2020   Pneumococcal Conjugate-13 05/21/2018   Pneumococcal Polysaccharide-23 05/21/2005   Tdap 06/03/2006, 10/22/2018   Zoster Recombinat (Shingrix) 10/22/2018, 12/24/2018   Pertinent  Health Maintenance Due  Topic Date Due   INFLUENZA VACCINE  Completed   Fall Risk 05/12/2018 10/22/2018 07/20/2019 11/11/2019 12/07/2020  Falls in the past year? 1 1 - 1 1  Was there an injury with Fall? 0 1 - 1 1  Fall Risk Category Calculator 2 3 - 2 3  Fall Risk Category Moderate High - Moderate High  Patient Fall Risk Level Moderate fall risk - - High fall risk High fall risk  Patient at Risk for Falls Due to Impaired mobility;History of fall(s);Impaired balance/gait;Mental status  change;Medication side effect History of fall(s);Impaired mobility History of fall(s);Impaired balance/gait;Impaired mobility;Mental status change;Medication side effect History of fall(s);Impaired balance/gait Impaired balance/gait;History of fall(s)  Fall risk Follow up Falls evaluation completed;Falls prevention discussed;Education provided Falls evaluation completed;Falls prevention discussed Education provided;Falls prevention discussed;Falls evaluation completed Falls evaluation completed Falls evaluation completed;Follow up appointment  Fall [facility]    Vitals:   06/04/21 1653  BP: 127/73  Pulse: 68  Resp: 18  Temp: 98.1 F (36.7 C)  SpO2: 90%   There is no height or weight on file to calculate BMI. Physical Exam Vitals and nursing note reviewed.  Constitutional:      General: He is not in acute distress.    Appearance: He is not diaphoretic.  HENT:     Head: Normocephalic and atraumatic.     Mouth/Throat:     Mouth: Mucous membranes are moist.     Pharynx: Oropharynx is clear.  Eyes:     Extraocular Movements: Extraocular movements intact.  Neck:     Thyroid: No thyromegaly.     Vascular: No JVD.     Trachea: No tracheal deviation.  Cardiovascular:     Rate and Rhythm: Normal rate and regular rhythm.     Heart sounds: No murmur heard. Pulmonary:     Effort: Pulmonary effort is normal. No respiratory distress.     Breath sounds: Normal breath sounds. No wheezing.  Abdominal:     General: Bowel sounds are normal. There is no distension.     Palpations: Abdomen is soft.     Tenderness: There is no abdominal tenderness.  Musculoskeletal:     Right shoulder: Normal.     Left shoulder: Normal. No swelling, deformity, tenderness or bony tenderness. Normal range of motion.     Comments: No pain with ROM to the neck. No pain on palpation of the neck or left shoulder.  Neg empty can test.   Lymphadenopathy:      Cervical: No cervical adenopathy.  Skin:    General: Skin is warm and dry.  Neurological:     Mental Status: He is alert and oriented to person, place, and time. Mental status is at baseline.     Cranial Nerves: No cranial  nerve deficit.     Motor: Weakness (left arm) present.     Coordination: Coordination abnormal.     Comments: Left arm weakness 3/5. Right arm 5/5.  No facial droop. Able to f/c, alert.  Psychiatric:        Mood and Affect: Mood normal.    Labs reviewed: Recent Labs    07/13/20 0000 02/12/21 0000  NA 139 142  K 3.6 3.7  CL 100 101  CO2 24* 27*  BUN 20 19  CREATININE 1.1 1.1  CALCIUM 9.1 8.9   Recent Labs    07/13/20 0000  AST 33  ALT 8*  ALKPHOS 83  ALBUMIN 4.5   Recent Labs    07/13/20 0000  WBC 7.4  HGB 12.5*  HCT 37*  PLT 232   Lab Results  Component Value Date   TSH 3.60 10/16/2020   No results found for: HGBA1C Lab Results  Component Value Date   CHOL 119 05/10/2021   HDL 56 05/10/2021   LDLCALC 48 05/10/2021   TRIG 76 05/10/2021    Significant Diagnostic Results in last 30 days:  No results found.  Assessment/Plan 1. Left arm weakness Presented with weakness to both arms, now only present to the left.  No facial droop or dysarthria. BP Ok ?CVA vs parkinson's progression as his symptoms did start on the left  Would recommend CT of the head and neck but he declined ER eval.  Will have him f/u with neurology.   2. Lethargy Improving but still present Check CXR UA C and S BMP CBC    Family/ staff Communication: discussed with his daughter Webb Silversmith and his wife [name]. They do not want any hospitalizations or aggressive care at this time. Will do labs and xray for any reversible issues. They understand that he may have had a stroke. We can not rule this out without a CT.   Labs/tests ordered:  as above.

## 2021-06-05 ENCOUNTER — Ambulatory Visit: Payer: PPO | Admitting: Cardiology

## 2021-06-05 DIAGNOSIS — F428 Other obsessive-compulsive disorder: Secondary | ICD-10-CM | POA: Diagnosis not present

## 2021-06-05 DIAGNOSIS — R531 Weakness: Secondary | ICD-10-CM | POA: Diagnosis not present

## 2021-06-05 DIAGNOSIS — M25512 Pain in left shoulder: Secondary | ICD-10-CM | POA: Diagnosis not present

## 2021-06-05 DIAGNOSIS — R2689 Other abnormalities of gait and mobility: Secondary | ICD-10-CM | POA: Diagnosis not present

## 2021-06-05 DIAGNOSIS — M6389 Disorders of muscle in diseases classified elsewhere, multiple sites: Secondary | ICD-10-CM | POA: Diagnosis not present

## 2021-06-05 DIAGNOSIS — R296 Repeated falls: Secondary | ICD-10-CM | POA: Diagnosis not present

## 2021-06-05 DIAGNOSIS — R41842 Visuospatial deficit: Secondary | ICD-10-CM | POA: Diagnosis not present

## 2021-06-05 DIAGNOSIS — G20 Parkinson's disease: Secondary | ICD-10-CM | POA: Diagnosis not present

## 2021-06-05 DIAGNOSIS — R0902 Hypoxemia: Secondary | ICD-10-CM | POA: Diagnosis not present

## 2021-06-05 DIAGNOSIS — R293 Abnormal posture: Secondary | ICD-10-CM | POA: Diagnosis not present

## 2021-06-06 DIAGNOSIS — M6389 Disorders of muscle in diseases classified elsewhere, multiple sites: Secondary | ICD-10-CM | POA: Diagnosis not present

## 2021-06-06 DIAGNOSIS — G20 Parkinson's disease: Secondary | ICD-10-CM | POA: Diagnosis not present

## 2021-06-06 DIAGNOSIS — R2689 Other abnormalities of gait and mobility: Secondary | ICD-10-CM | POA: Diagnosis not present

## 2021-06-06 DIAGNOSIS — R293 Abnormal posture: Secondary | ICD-10-CM | POA: Diagnosis not present

## 2021-06-06 DIAGNOSIS — F428 Other obsessive-compulsive disorder: Secondary | ICD-10-CM | POA: Diagnosis not present

## 2021-06-06 DIAGNOSIS — R41842 Visuospatial deficit: Secondary | ICD-10-CM | POA: Diagnosis not present

## 2021-06-06 DIAGNOSIS — R296 Repeated falls: Secondary | ICD-10-CM | POA: Diagnosis not present

## 2021-06-07 ENCOUNTER — Encounter: Payer: Self-pay | Admitting: Neurology

## 2021-06-14 DIAGNOSIS — G20 Parkinson's disease: Secondary | ICD-10-CM | POA: Diagnosis not present

## 2021-06-14 DIAGNOSIS — R296 Repeated falls: Secondary | ICD-10-CM | POA: Diagnosis not present

## 2021-06-14 DIAGNOSIS — R293 Abnormal posture: Secondary | ICD-10-CM | POA: Diagnosis not present

## 2021-06-14 DIAGNOSIS — F428 Other obsessive-compulsive disorder: Secondary | ICD-10-CM | POA: Diagnosis not present

## 2021-06-14 DIAGNOSIS — M6389 Disorders of muscle in diseases classified elsewhere, multiple sites: Secondary | ICD-10-CM | POA: Diagnosis not present

## 2021-06-14 DIAGNOSIS — R41842 Visuospatial deficit: Secondary | ICD-10-CM | POA: Diagnosis not present

## 2021-06-14 DIAGNOSIS — R2689 Other abnormalities of gait and mobility: Secondary | ICD-10-CM | POA: Diagnosis not present

## 2021-06-19 ENCOUNTER — Encounter: Payer: Self-pay | Admitting: Orthopedic Surgery

## 2021-06-19 ENCOUNTER — Non-Acute Institutional Stay (SKILLED_NURSING_FACILITY): Payer: PPO | Admitting: Orthopedic Surgery

## 2021-06-19 DIAGNOSIS — R2689 Other abnormalities of gait and mobility: Secondary | ICD-10-CM | POA: Diagnosis not present

## 2021-06-19 DIAGNOSIS — J302 Other seasonal allergic rhinitis: Secondary | ICD-10-CM | POA: Diagnosis not present

## 2021-06-19 DIAGNOSIS — M24532 Contracture, left wrist: Secondary | ICD-10-CM

## 2021-06-19 DIAGNOSIS — R296 Repeated falls: Secondary | ICD-10-CM | POA: Diagnosis not present

## 2021-06-19 DIAGNOSIS — R41842 Visuospatial deficit: Secondary | ICD-10-CM | POA: Diagnosis not present

## 2021-06-19 DIAGNOSIS — G20 Parkinson's disease: Secondary | ICD-10-CM | POA: Diagnosis not present

## 2021-06-19 DIAGNOSIS — F428 Other obsessive-compulsive disorder: Secondary | ICD-10-CM | POA: Diagnosis not present

## 2021-06-19 DIAGNOSIS — M6389 Disorders of muscle in diseases classified elsewhere, multiple sites: Secondary | ICD-10-CM | POA: Diagnosis not present

## 2021-06-19 DIAGNOSIS — M24531 Contracture, right wrist: Secondary | ICD-10-CM | POA: Diagnosis not present

## 2021-06-19 DIAGNOSIS — R293 Abnormal posture: Secondary | ICD-10-CM | POA: Diagnosis not present

## 2021-06-19 MED ORDER — CETIRIZINE HCL 10 MG PO TABS: 10.0000 mg | ORAL_TABLET | Freq: Every day | ORAL | 11 refills | Status: DC

## 2021-06-19 NOTE — Progress Notes (Signed)
Location:  [facility] Room Number: 133/A Place of Service:  SNF [phone]) Provider: Octavia Heir, NP  Patient Care Team: Mahlon Gammon, MD as PCP - General (Internal Medicine) [place], [name], MD as PCP - Cardiology (Cardiology) [place], [name], MD as Consulting Physician (Cardiology) Rachael Fee, MD as Attending Physician (Gastroenterology) [name], MD (Inactive) as Consulting Physician (Neurology) Community, [facility] (Skilled Nursing Facility) Fletcher Anon, NP as Nurse Practitioner (Nurse Practitioner)  Extended Emergency Contact Information Primary Emergency Contact: Charlaine Dalton Address: [address]          [place], [place] [address] D[place] of [place] Home Phone: [phone] Mobile Phone: [phone] Relation: Spouse  Code Status: DNR Goals of care: Advanced Directive information Advanced Directives 06/19/2021  Does Patient Have a Medical Advance Directive? Yes  Type of Estate agent of [place];Living will;Out of facility DNR (pink MOST or yellow form)  Does patient want to make changes to medical advance directive? No - Patient declined  Copy of Healthcare Power of Attorney in Chart? Yes - validated most recent copy scanned in chart (See row information)  Pre-existing out of facility DNR order (yellow form or pink MOST form) Yellow form placed in chart (order not valid for inpatient use)     Chief Complaint  Patient presents with   Acute Visit    Fall and hand contracture    HPI:  Pt is a 88 y.o. male seen today for an acute visit for fall and hand contracture.   03/12 he was found on the floor between bathroom and closet. He denied hitting head, no apparent injury, vitals stable. He woke up this morning with clear nasal congestion and left wrist stiffness. He denies cold symptoms. Afebrile, vitals stable. He has a history of allergies, remains on Claritin and  saline nasal spray.   He has been having more difficulty moving his hands. Denies pain or injury.  H/o parkinson's disease, followed by neurology. Unable to perform wrist flexion or extension of both hands. Scheduled to see neurology 10/2021. Remains on sinemet.   Past Medical History:  Diagnosis Date   Cellulitis    Chronic diastolic CHF (congestive heart failure) (HCC)    a. EF initially 35-40% after MI 1/09; b. echo 7/08: EF 60%;  c. 11/2014 Echo: EF 65-70%, Gr 1 DD, mild MR.   Coronary artery disease    a. s/p anterior STEMI 04/2007 with BMS-> LAD;  b. Cath 12/2011 patent stent;  c. low risk nuc 04/2014.   Crohn's disease (HCC)    Diaphragmatic hernia without mention of obstruction or gangrene    Diverticulosis of colon (without mention of hemorrhage)    Fatty liver    a. on ultrasound of 10/2009   Flatulence, eructation, and gas pain    Fungal infection    Gait disorder    GERD (gastroesophageal reflux disease)    HOH (hard of hearing)    Hearing aids   Hyperlipidemia    Hypertension    Ischemic cardiomyopathy    a. EF initially 35-40% after MI 1/09; b. echo 7/08: EF 60%;  c. 11/2014 Echo: EF 65-70%, Gr 1 DD, mild MR.   Melanoma (HCC)    Memory disorder    Orthostatic hypotension    Osteoporosis    Other chronic nonalcoholic liver disease    Other esophagitis    Parkinson's disease (HCC)    RBBB 09/02/2013   Regional enteritis of small intestine (  HCC)    Renal calculi    Restless leg syndrome 03/22/2015   Ulcerative (chronic) ileocolitis (HCC)    Urinary incontinence    Past Surgical History:  Procedure Laterality Date   APPENDECTOMY     BACK SURGERY     L3, L4, L5   CARDIAC CATHETERIZATION  09/25/06   EF 35-40% but more recently 60%   CATARACT EXTRACTION     Bilateral   CHOLECYSTECTOMY     Coronary artery stent placement     Melanoma resection     Partial bowel resection     TONSILLECTOMY      No Known Allergies  Outpatient Encounter Medications as of 06/19/2021   Medication Sig   acetaminophen (TYLENOL) 325 MG tablet Take 650 mg by mouth every 6 (six) hours as needed.   aspirin EC 81 MG tablet Take 81 mg by mouth daily.   atorvastatin (LIPITOR) 40 MG tablet Take 40 mg by mouth daily.   calcium carbonate (OS-CAL) 600 MG TABS tablet Take 600 mg by mouth daily with breakfast.   carbidopa-levodopa (SINEMET CR) 50-200 MG tablet Take 1 tablet by mouth 4 (four) times daily.   carbidopa-levodopa (SINEMET IR) 25-100 MG tablet Take 1 tablet by mouth 4 (four) times daily.   Cholecalciferol (VITAMIN D3) 50 MCG (2000 UT) TABS Take 1 tablet by mouth daily.   cholestyramine (QUESTRAN) 4 g packet TAKE 1 PACKET BY MOUTH TWICE DAILY WITH A MEAL   diclofenac Sodium (VOLTAREN) 1 % GEL Apply topically 3 (three) times daily as needed (Apply to the affected right ankle).   donepezil (ARICEPT) 10 MG tablet TAKE ONE TABLET AT BEDTIME   ipratropium (ATROVENT) 0.03 % nasal spray Place 2 sprays into both nostrils 3 (three) times daily.   lidocaine (XYLOCAINE) 2 % solution Use as directed 15 mLs in the mouth or throat as needed for mouth pain.   loratadine (CLARITIN) 10 MG tablet Take 1 tablet (10 mg total) by mouth daily.   midodrine (PROAMATINE) 5 MG tablet Take 1 tablet (5 mg total) by mouth 2 (two) times daily with a meal.   multivitamin-iron-minerals-folic acid (CENTRUM) chewable tablet Chew 1 tablet by mouth daily.   nitroGLYCERIN (NITROSTAT) 0.4 MG SL tablet Place 1 tablet (0.4 mg total) under the tongue every 5 (five) minutes as needed for chest pain. PT OVERDUE FOR OV PLEASE CALL FOR APPT   pantoprazole (PROTONIX) 40 MG tablet Take 40 mg by mouth daily.   PARoxetine (PAXIL) 20 MG tablet Take 20 mg by mouth 2 (two) times daily.   potassium chloride SA (K-DUR,KLOR-CON) 20 MEQ tablet Take 40 mEq by mouth daily.   Probiotic Product (ALIGN) 4 MG CAPS Take 1 capsule by mouth daily.    sodium chloride (OCEAN) 0.65 % SOLN nasal spray Place 2 sprays into both nostrils daily as  needed for congestion.    torsemide (DEMADEX) 20 MG tablet Take 1 tablet (20 mg total) by mouth daily.   vitamin B-12 (CYANOCOBALAMIN) 1000 MCG tablet Take 1,000 mcg by mouth daily.   No facility-administered encounter medications on file as of 06/19/2021.    Review of Systems  Constitutional:  Negative for activity change, appetite change, chills, fatigue and fever.  HENT:  Positive for congestion. Negative for sore throat and trouble swallowing.   Eyes:  Negative for visual disturbance.  Respiratory:  Negative for cough, shortness of breath and wheezing.   Cardiovascular:  Negative for chest pain and leg swelling.  Gastrointestinal:  Negative for nausea and  vomiting.  Genitourinary:  Negative for dysuria, frequency and hematuria.  Musculoskeletal:  Positive for arthralgias and gait problem. Negative for myalgias.  Skin:  Negative for rash and wound.  Neurological:  Positive for tremors and weakness. Negative for dizziness and headaches.  Psychiatric/Behavioral:  Positive for confusion. Negative for dysphoric mood and sleep disturbance. The patient is not nervous/anxious.    Immunization History  Administered Date(s) Administered   Influenza, High Dose Seasonal PF 01/21/2019, 01/10/2021   Influenza-Unspecified 12/06/2014, 11/27/2017, 02/01/2020   Moderna Covid-19 Vaccine Bivalent Booster 28yrs & up 01/17/2021   Moderna SARS-COV2 Booster Vaccination 11/28/2020   Moderna Sars-Covid-2 Vaccination 04/13/2019, 05/11/2019, 02/18/2020   Pneumococcal Conjugate-13 05/21/2018   Pneumococcal Polysaccharide-23 05/21/2005   Tdap 06/03/2006, 10/22/2018   Zoster Recombinat (Shingrix) 10/22/2018, 12/24/2018   Pertinent  Health Maintenance Due  Topic Date Due   INFLUENZA VACCINE  Completed   Fall Risk 05/12/2018 10/22/2018 07/20/2019 11/11/2019 12/07/2020  Falls in the past year? 1 1 - 1 1  Was there an injury with Fall? 0 1 - 1 1  Fall Risk Category Calculator 2 3 - 2 3  Fall Risk Category Moderate  High - Moderate High  Patient Fall Risk Level Moderate fall risk - - High fall risk High fall risk  Patient at Risk for Falls Due to Impaired mobility;History of fall(s);Impaired balance/gait;Mental status change;Medication side effect History of fall(s);Impaired mobility History of fall(s);Impaired balance/gait;Impaired mobility;Mental status change;Medication side effect History of fall(s);Impaired balance/gait Impaired balance/gait;History of fall(s)  Fall risk Follow up Falls evaluation completed;Falls prevention discussed;Education provided Falls evaluation completed;Falls prevention discussed Education provided;Falls prevention discussed;Falls evaluation completed Falls evaluation completed Falls evaluation completed;Follow up appointment  Fall risk Follow up - - at [facility] - -   Functional Status Survey:    Vitals:   06/19/21 0937  BP: 128/80  Pulse: 82  Resp: 18  Temp: 97.8 F (36.6 C)  SpO2: 97%  Weight: 167 lb 14.4 oz (76.2 kg)  Height: 5' 9.8" (1.773 m)   Body mass index is 24.23 kg/m. Physical Exam Vitals reviewed.  Constitutional:      General: He is not in acute distress. HENT:     Head: Normocephalic.     Nose: Congestion present.     Right Turbinates: Not swollen.     Left Turbinates: Not swollen.     Mouth/Throat:     Mouth: Mucous membranes are moist.     Pharynx: No oropharyngeal exudate or posterior oropharyngeal erythema.  Eyes:     General:        Right eye: No discharge.        Left eye: No discharge.  Neck:     Vascular: No carotid bruit.  Cardiovascular:     Rate and Rhythm: Normal rate and regular rhythm.     Pulses: Normal pulses.     Heart sounds: Normal heart sounds.  Pulmonary:     Effort: Pulmonary effort is normal. No respiratory distress.     Breath sounds: Normal breath sounds. No wheezing.  Abdominal:     General: Bowel sounds are normal. There is no distension.     Palpations: Abdomen is soft.     Tenderness: There is no  abdominal tenderness.  Musculoskeletal:     Right wrist: No swelling or deformity. Decreased range of motion.     Left wrist: No swelling or deformity. Decreased range of motion.     Cervical back: Neck supple.     Right lower leg: No edema.  Left lower leg: No edema.     Comments: Strength 3/5, no wrist flexion/extension  Lymphadenopathy:     Cervical: No cervical adenopathy.  Skin:    General: Skin is warm and dry.     Capillary Refill: Capillary refill takes less than 2 seconds.  Neurological:     General: No focal deficit present.     Mental Status: He is alert. Mental status is at baseline.     Motor: Weakness present.     Gait: Gait abnormal.     Comments: Power wheelchair  Psychiatric:        Mood and Affect: Mood normal.        Behavior: Behavior normal.     Comments: Very pleasant, follows commands    Labs reviewed: Recent Labs    07/13/20 0000 02/12/21 0000 06/04/21 0000  NA 139 142 141  K 3.6 3.7 4.5  CL 100 101 99  CO2 24* 27* 31*  BUN 20 19 18   CREATININE 1.1 1.1 1.1  CALCIUM 9.1 8.9 8.4*   Recent Labs    07/13/20 0000 06/04/21 0000  AST 33 26  ALT 8* 12  ALKPHOS 83 71  ALBUMIN 4.5 4.4   Recent Labs    07/13/20 0000 06/04/21 0000  WBC 7.4 7.4  NEUTROABS  --  4.40  HGB 12.5* 12.9*  HCT 37* 38*  PLT 232 245   Lab Results  Component Value Date   TSH 3.60 10/16/2020   No results found for: HGBA1C Lab Results  Component Value Date   CHOL 119 05/10/2021   HDL 56 05/10/2021   LDLCALC 48 05/10/2021   TRIG 76 05/10/2021    Significant Diagnostic Results in last 30 days:  No results found.  Assessment/Plan 1. Parkinson disease [facility]) - followed by neurology - fall 03/12- no apparent injury - cont sinemet  2. Seasonal allergies - swollen turbinates, clear nasal drainage, afebrile, vitals stable - cont saline nasal spray - d/c Claritin - start zyrtec 10 mg po qhs  3. Contracture of both wrist joints - limited ROM - suspect  related to Parkinson's - may need hand braces - OT evaluation   Family/ staff Communication: plan discussed with patient and nurse  Labs/tests ordered:  none

## 2021-06-20 DIAGNOSIS — D1801 Hemangioma of skin and subcutaneous tissue: Secondary | ICD-10-CM | POA: Diagnosis not present

## 2021-06-20 DIAGNOSIS — L570 Actinic keratosis: Secondary | ICD-10-CM | POA: Diagnosis not present

## 2021-06-20 DIAGNOSIS — L821 Other seborrheic keratosis: Secondary | ICD-10-CM | POA: Diagnosis not present

## 2021-06-20 DIAGNOSIS — Z85828 Personal history of other malignant neoplasm of skin: Secondary | ICD-10-CM | POA: Diagnosis not present

## 2021-06-20 DIAGNOSIS — L814 Other melanin hyperpigmentation: Secondary | ICD-10-CM | POA: Diagnosis not present

## 2021-06-20 DIAGNOSIS — Z85820 Personal history of malignant melanoma of skin: Secondary | ICD-10-CM | POA: Diagnosis not present

## 2021-06-20 DIAGNOSIS — L820 Inflamed seborrheic keratosis: Secondary | ICD-10-CM | POA: Diagnosis not present

## 2021-06-21 DIAGNOSIS — R296 Repeated falls: Secondary | ICD-10-CM | POA: Diagnosis not present

## 2021-06-21 DIAGNOSIS — R2689 Other abnormalities of gait and mobility: Secondary | ICD-10-CM | POA: Diagnosis not present

## 2021-06-21 DIAGNOSIS — F428 Other obsessive-compulsive disorder: Secondary | ICD-10-CM | POA: Diagnosis not present

## 2021-06-21 DIAGNOSIS — G20 Parkinson's disease: Secondary | ICD-10-CM | POA: Diagnosis not present

## 2021-06-21 DIAGNOSIS — M6389 Disorders of muscle in diseases classified elsewhere, multiple sites: Secondary | ICD-10-CM | POA: Diagnosis not present

## 2021-06-21 DIAGNOSIS — R41842 Visuospatial deficit: Secondary | ICD-10-CM | POA: Diagnosis not present

## 2021-06-21 DIAGNOSIS — R293 Abnormal posture: Secondary | ICD-10-CM | POA: Diagnosis not present

## 2021-06-26 ENCOUNTER — Telehealth: Payer: Self-pay | Admitting: Adult Health

## 2021-06-26 ENCOUNTER — Other Ambulatory Visit: Payer: Self-pay | Admitting: Internal Medicine

## 2021-06-26 DIAGNOSIS — M6389 Disorders of muscle in diseases classified elsewhere, multiple sites: Secondary | ICD-10-CM | POA: Diagnosis not present

## 2021-06-26 DIAGNOSIS — R531 Weakness: Secondary | ICD-10-CM

## 2021-06-26 DIAGNOSIS — R41842 Visuospatial deficit: Secondary | ICD-10-CM | POA: Diagnosis not present

## 2021-06-26 DIAGNOSIS — R293 Abnormal posture: Secondary | ICD-10-CM | POA: Diagnosis not present

## 2021-06-26 DIAGNOSIS — F428 Other obsessive-compulsive disorder: Secondary | ICD-10-CM | POA: Diagnosis not present

## 2021-06-26 DIAGNOSIS — G20 Parkinson's disease: Secondary | ICD-10-CM | POA: Diagnosis not present

## 2021-06-26 DIAGNOSIS — R296 Repeated falls: Secondary | ICD-10-CM | POA: Diagnosis not present

## 2021-06-26 DIAGNOSIS — R2689 Other abnormalities of gait and mobility: Secondary | ICD-10-CM | POA: Diagnosis not present

## 2021-06-26 NOTE — Telephone Encounter (Signed)
Nurse Autumn at [facility] expressed concern regarding [name] [name] weakness. He presented with left sided weakness and his family declined ER eval due to his advancing age and dementia. At this time he is having difficulty feeding himself and has numbness and weakness in the left hand without facial droop. Will order CT of the head and neck without contrast  Compression of a nerve vs CVA? ?

## 2021-06-27 DIAGNOSIS — M6389 Disorders of muscle in diseases classified elsewhere, multiple sites: Secondary | ICD-10-CM | POA: Diagnosis not present

## 2021-06-27 DIAGNOSIS — R293 Abnormal posture: Secondary | ICD-10-CM | POA: Diagnosis not present

## 2021-06-27 DIAGNOSIS — R41842 Visuospatial deficit: Secondary | ICD-10-CM | POA: Diagnosis not present

## 2021-06-27 DIAGNOSIS — F428 Other obsessive-compulsive disorder: Secondary | ICD-10-CM | POA: Diagnosis not present

## 2021-06-27 DIAGNOSIS — G20 Parkinson's disease: Secondary | ICD-10-CM | POA: Diagnosis not present

## 2021-06-27 DIAGNOSIS — R296 Repeated falls: Secondary | ICD-10-CM | POA: Diagnosis not present

## 2021-06-27 DIAGNOSIS — R2689 Other abnormalities of gait and mobility: Secondary | ICD-10-CM | POA: Diagnosis not present

## 2021-06-28 ENCOUNTER — Other Ambulatory Visit: Payer: Self-pay | Admitting: Internal Medicine

## 2021-06-28 ENCOUNTER — Non-Acute Institutional Stay (SKILLED_NURSING_FACILITY): Payer: PPO | Admitting: Adult Health

## 2021-06-28 ENCOUNTER — Encounter: Payer: Self-pay | Admitting: Adult Health

## 2021-06-28 DIAGNOSIS — I700 Atherosclerosis of aorta: Secondary | ICD-10-CM

## 2021-06-28 DIAGNOSIS — I10 Essential (primary) hypertension: Secondary | ICD-10-CM

## 2021-06-28 DIAGNOSIS — R29898 Other symptoms and signs involving the musculoskeletal system: Secondary | ICD-10-CM

## 2021-06-28 DIAGNOSIS — F422 Mixed obsessional thoughts and acts: Secondary | ICD-10-CM

## 2021-06-28 DIAGNOSIS — R531 Weakness: Secondary | ICD-10-CM

## 2021-06-28 DIAGNOSIS — K5000 Crohn's disease of small intestine without complications: Secondary | ICD-10-CM | POA: Diagnosis not present

## 2021-06-28 DIAGNOSIS — F0280 Dementia in other diseases classified elsewhere without behavioral disturbance: Secondary | ICD-10-CM | POA: Diagnosis not present

## 2021-06-28 DIAGNOSIS — I2510 Atherosclerotic heart disease of native coronary artery without angina pectoris: Secondary | ICD-10-CM

## 2021-06-28 DIAGNOSIS — E782 Mixed hyperlipidemia: Secondary | ICD-10-CM

## 2021-06-28 DIAGNOSIS — G20 Parkinson's disease: Secondary | ICD-10-CM | POA: Diagnosis not present

## 2021-06-28 NOTE — Progress Notes (Signed)
?Location:  [facility] Room Number: 831-D ?Place of Service:  SNF (31) ?Provider:  Royal Hawthorn, NP  ? ?Patient Care Team: ?[name], MD as PCP - General (Internal Medicine) ?[name], [name], MD as PCP - Cardiology (Cardiology) ?[name], [name], MD as Consulting Physician (Cardiology) ?[name], MD as Attending Physician (Gastroenterology) ?[name], MD (Inactive) as Consulting Physician (Neurology) ?Community, [facility] ([place]) ?Royal Hawthorn, NP as Nurse Practitioner (Nurse Practitioner) ? ?Extended Emergency Contact Information ?Primary Emergency Contact: Armentor,Nancy C ?Address: [address] ?         [place], [place] [address] [place] ?Home Phone: [phone] ?Mobile Phone: [phone] ?Relation: Spouse ?Secondary Emergency Contact: [name] ?Address: [address] ?         [place],  [address] [place] ?Mobile Phone: [phone] ?Relation: Daughter ? ?Code Status:  DNR ?Goals of care: Advanced Directive information ? ?  06/28/2021  ?  1:54 PM  ?Advanced Directives  ?Does Patient Have a Medical Advance Directive? Yes  ?Type of Paramedic of [place];Living will;Out of facility DNR (pink MOST or yellow form)  ?Does patient want to make changes to medical advance directive? No - Patient declined  ?Copy of [place] in Chart? Yes - validated most recent copy scanned in chart (See row information)  ?Pre-existing out of facility DNR order (yellow form or pink MOST form) Yellow form placed in chart (order not valid for inpatient use)  ? ? ? ?Chief Complaint  ?Patient presents with  ? Medical Management of Chronic Issues  ?  Routine Visit and discuss need for additional covid boosters or post pone if patient refuses.   ? ? ?HPI:  ?Pt is a 88 y.o. male seen today for medical management of chronic diseases.   ? ?Fell on 3/15, he  was trying to reach for something. Did not hit his head. Found on the floor. Seems to be doing ok. Has a hx of frequent falls. Now using WC and propelling with his feet. No longer able to use power chair due to safety issues. ?Seen for left arm weakness/numbness.2/27.  He was lethargic and weak all over then improved and able use right hand but left hand remained weak. Family declined ER visit. UA, CXR, labs were negative for an acute process.  ? His left hand remains weak. He is able to raise his arm overhead. No facial droop. Left hand contracting inward. Remains numbness. Also has numbness in the right hand. Denies any neck pain or back pain. He can feed himself with the right hand but not the left.  ?Bps reviewed in matrix 120-140 range.  ?Weight ok. Reports is appetite is adequate.  ?Wt Readings from Last 3 Encounters:  ?06/28/21 167 lb 14.4 oz (76.2 kg)  ?06/19/21 167 lb 14.4 oz (76.2 kg)  ?05/08/21 166 lb 9.6 oz (75.6 kg)  ? ?LDL 48 on statin ?Prior hx of CAD with stent, aortic atherosclerosis noted on CT 11/21/17 ? ?Has PD on sinemet. Follows with neurology  ?Memory loss progressive on aricept. Issue with OCD on paxil. Continues with ritual surrounding bathing and hygiene.  ?Crohns disease: no current issues with pain, fever, or diarrhea.  ?Past Medical History:  ?Diagnosis Date  ? Cellulitis   ? Chronic diastolic CHF (congestive heart failure) ([place])   ? a. EF initially 35-40% after MI 1/09; b. echo 7/08: EF 60%;  c. 11/2014 Echo:  EF 65-70%, Gr 1 DD, mild MR.  ? Coronary artery disease   ? a. s/p anterior STEMI 04/2007 with BMS-> LAD;  b. Cath 12/2011 patent stent;  c. low risk nuc 04/2014.  ? Crohn's disease ([place])   ? Diaphragmatic hernia without mention of obstruction or gangrene   ? Diverticulosis of colon (without mention of hemorrhage)   ? Fatty liver   ? a. on ultrasound of 10/2009  ? Flatulence, eructation, and gas pain   ? Fungal infection   ? Gait disorder   ? GERD (gastroesophageal reflux disease)   ?  HOH (hard of hearing)   ? Hearing aids  ? Hyperlipidemia   ? Hypertension   ? Ischemic cardiomyopathy   ? a. EF initially 35-40% after MI 1/09; b. echo 7/08: EF 60%;  c. 11/2014 Echo: EF 65-70%, Gr 1 DD, mild MR.  ? Melanoma ([place])   ? Memory disorder   ? Orthostatic hypotension   ? Osteoporosis   ? Other chronic nonalcoholic liver disease   ? Other esophagitis   ? Parkinson's disease ([place])   ? RBBB 09/02/2013  ? Regional enteritis of small intestine ([place])   ? Renal calculi   ? Restless leg syndrome 03/22/2015  ? Ulcerative (chronic) ileocolitis ([place])   ? Urinary incontinence   ? ?Past Surgical History:  ?Procedure Laterality Date  ? APPENDECTOMY    ? BACK SURGERY    ? L3, L4, L5  ? CARDIAC CATHETERIZATION  09/25/06  ? EF 35-40% but more recently 60%  ? CATARACT EXTRACTION    ? Bilateral  ? CHOLECYSTECTOMY    ? Coronary artery stent placement    ? Melanoma resection    ? Partial bowel resection    ? TONSILLECTOMY    ? ? ?No Known Allergies ? ?Outpatient Encounter Medications as of 06/28/2021  ?Medication Sig  ? acetaminophen (TYLENOL) 325 MG tablet Take 650 mg by mouth at bedtime. 8 pm scheduled and every 6 hours as needed throughout the day  ? aspirin EC 81 MG tablet Take 81 mg by mouth daily.  ? atorvastatin (LIPITOR) 40 MG tablet Take 40 mg by mouth daily.  ? calcium carbonate (OS-CAL) 600 MG TABS tablet Take 600 mg by mouth daily with breakfast.  ? carbidopa-levodopa (SINEMET CR) 50-200 MG tablet Take 1 tablet by mouth 4 (four) times daily.  ? carbidopa-levodopa (SINEMET IR) 25-100 MG tablet Take 1 tablet by mouth 4 (four) times daily.  ? cetirizine (ZYRTEC) 10 MG tablet Take 1 tablet (10 mg total) by mouth at bedtime.  ? Cholecalciferol (VITAMIN D3) 50 MCG (2000 UT) TABS Take 1 tablet by mouth daily.  ? cholestyramine (QUESTRAN) 4 g packet TAKE 1 PACKET BY MOUTH TWICE DAILY WITH A MEAL  ? diclofenac Sodium (VOLTAREN) 1 % GEL Apply topically 3 (three) times daily as needed (Apply to the affected right ankle).  ?  donepezil (ARICEPT) 10 MG tablet TAKE ONE TABLET AT BEDTIME  ? ipratropium (ATROVENT) 0.03 % nasal spray Place 2 sprays into both nostrils 3 (three) times daily.  ? lidocaine (XYLOCAINE) 2 % solution Use as directed 15 mLs in the mouth or throat as needed for mouth pain.  ? midodrine (PROAMATINE) 5 MG tablet Take 1 tablet (5 mg total) by mouth 2 (two) times daily with a meal.  ? multivitamin-iron-minerals-folic acid (CENTRUM) chewable tablet Chew 1 tablet by mouth daily.  ? nitroGLYCERIN (NITROSTAT) 0.4 MG SL tablet Place 1 tablet (0.4 mg total) under the tongue every 5 (  five) minutes as needed for chest pain. PT OVERDUE FOR OV PLEASE CALL FOR APPT  ? pantoprazole (PROTONIX) 40 MG tablet Take 40 mg by mouth daily.  ? PARoxetine (PAXIL) 20 MG tablet Take 20 mg by mouth 2 (two) times daily.  ? potassium chloride SA (K-DUR,KLOR-CON) 20 MEQ tablet Take 40 mEq by mouth daily.  ? Probiotic Product (ALIGN) 4 MG CAPS Take 1 capsule by mouth daily.   ? sodium chloride (OCEAN) 0.65 % SOLN nasal spray Place 2 sprays into both nostrils daily as needed for congestion.   ? torsemide (DEMADEX) 20 MG tablet Take 1 tablet (20 mg total) by mouth daily.  ? vitamin B-12 (CYANOCOBALAMIN) 1000 MCG tablet Take 1,000 mcg by mouth daily.  ? ?No facility-administered encounter medications on file as of 06/28/2021.  ? ? ?Review of Systems  ?Constitutional:  Negative for activity change, appetite change, chills, diaphoresis, fatigue, fever and unexpected weight change.  ?HENT:  Positive for rhinorrhea.   ?Respiratory:  Negative for cough, shortness of breath, wheezing and stridor.   ?Cardiovascular:  Negative for chest pain, palpitations and leg swelling.  ?Gastrointestinal:  Negative for abdominal distention, abdominal pain, constipation and diarrhea.  ?Genitourinary:  Negative for difficulty urinating and dysuria.  ?Musculoskeletal:  Positive for gait problem. Negative for arthralgias, back pain, joint swelling and myalgias.  ?Neurological:   Positive for weakness (left hand) and numbness. Negative for dizziness, seizures, syncope, facial asymmetry, speech difficulty and headaches.  ?Hematological:  Negative for adenopathy. Does not bruise/blee

## 2021-06-29 DIAGNOSIS — R296 Repeated falls: Secondary | ICD-10-CM | POA: Diagnosis not present

## 2021-06-29 DIAGNOSIS — G20 Parkinson's disease: Secondary | ICD-10-CM | POA: Diagnosis not present

## 2021-06-29 DIAGNOSIS — R41842 Visuospatial deficit: Secondary | ICD-10-CM | POA: Diagnosis not present

## 2021-06-29 DIAGNOSIS — R293 Abnormal posture: Secondary | ICD-10-CM | POA: Diagnosis not present

## 2021-06-29 DIAGNOSIS — F605 Obsessive-compulsive personality disorder: Secondary | ICD-10-CM | POA: Diagnosis not present

## 2021-06-29 DIAGNOSIS — F428 Other obsessive-compulsive disorder: Secondary | ICD-10-CM | POA: Diagnosis not present

## 2021-06-29 DIAGNOSIS — M6389 Disorders of muscle in diseases classified elsewhere, multiple sites: Secondary | ICD-10-CM | POA: Diagnosis not present

## 2021-06-29 DIAGNOSIS — R2689 Other abnormalities of gait and mobility: Secondary | ICD-10-CM | POA: Diagnosis not present

## 2021-07-03 DIAGNOSIS — M6389 Disorders of muscle in diseases classified elsewhere, multiple sites: Secondary | ICD-10-CM | POA: Diagnosis not present

## 2021-07-03 DIAGNOSIS — R41842 Visuospatial deficit: Secondary | ICD-10-CM | POA: Diagnosis not present

## 2021-07-03 DIAGNOSIS — R296 Repeated falls: Secondary | ICD-10-CM | POA: Diagnosis not present

## 2021-07-03 DIAGNOSIS — F428 Other obsessive-compulsive disorder: Secondary | ICD-10-CM | POA: Diagnosis not present

## 2021-07-03 DIAGNOSIS — G20 Parkinson's disease: Secondary | ICD-10-CM | POA: Diagnosis not present

## 2021-07-03 DIAGNOSIS — R2689 Other abnormalities of gait and mobility: Secondary | ICD-10-CM | POA: Diagnosis not present

## 2021-07-03 DIAGNOSIS — R293 Abnormal posture: Secondary | ICD-10-CM | POA: Diagnosis not present

## 2021-07-04 DIAGNOSIS — R41842 Visuospatial deficit: Secondary | ICD-10-CM | POA: Diagnosis not present

## 2021-07-04 DIAGNOSIS — R2689 Other abnormalities of gait and mobility: Secondary | ICD-10-CM | POA: Diagnosis not present

## 2021-07-04 DIAGNOSIS — G20 Parkinson's disease: Secondary | ICD-10-CM | POA: Diagnosis not present

## 2021-07-04 DIAGNOSIS — R293 Abnormal posture: Secondary | ICD-10-CM | POA: Diagnosis not present

## 2021-07-04 DIAGNOSIS — F428 Other obsessive-compulsive disorder: Secondary | ICD-10-CM | POA: Diagnosis not present

## 2021-07-04 DIAGNOSIS — R296 Repeated falls: Secondary | ICD-10-CM | POA: Diagnosis not present

## 2021-07-04 DIAGNOSIS — M6389 Disorders of muscle in diseases classified elsewhere, multiple sites: Secondary | ICD-10-CM | POA: Diagnosis not present

## 2021-07-06 DIAGNOSIS — F428 Other obsessive-compulsive disorder: Secondary | ICD-10-CM | POA: Diagnosis not present

## 2021-07-06 DIAGNOSIS — M6389 Disorders of muscle in diseases classified elsewhere, multiple sites: Secondary | ICD-10-CM | POA: Diagnosis not present

## 2021-07-06 DIAGNOSIS — R2689 Other abnormalities of gait and mobility: Secondary | ICD-10-CM | POA: Diagnosis not present

## 2021-07-06 DIAGNOSIS — R41842 Visuospatial deficit: Secondary | ICD-10-CM | POA: Diagnosis not present

## 2021-07-06 DIAGNOSIS — R293 Abnormal posture: Secondary | ICD-10-CM | POA: Diagnosis not present

## 2021-07-06 DIAGNOSIS — G20 Parkinson's disease: Secondary | ICD-10-CM | POA: Diagnosis not present

## 2021-07-06 DIAGNOSIS — R296 Repeated falls: Secondary | ICD-10-CM | POA: Diagnosis not present

## 2021-07-09 DIAGNOSIS — M6389 Disorders of muscle in diseases classified elsewhere, multiple sites: Secondary | ICD-10-CM | POA: Diagnosis not present

## 2021-07-09 DIAGNOSIS — R293 Abnormal posture: Secondary | ICD-10-CM | POA: Diagnosis not present

## 2021-07-09 DIAGNOSIS — R2689 Other abnormalities of gait and mobility: Secondary | ICD-10-CM | POA: Diagnosis not present

## 2021-07-09 DIAGNOSIS — R41842 Visuospatial deficit: Secondary | ICD-10-CM | POA: Diagnosis not present

## 2021-07-09 DIAGNOSIS — G20 Parkinson's disease: Secondary | ICD-10-CM | POA: Diagnosis not present

## 2021-07-09 DIAGNOSIS — R296 Repeated falls: Secondary | ICD-10-CM | POA: Diagnosis not present

## 2021-07-09 DIAGNOSIS — F428 Other obsessive-compulsive disorder: Secondary | ICD-10-CM | POA: Diagnosis not present

## 2021-07-11 DIAGNOSIS — R293 Abnormal posture: Secondary | ICD-10-CM | POA: Diagnosis not present

## 2021-07-11 DIAGNOSIS — F428 Other obsessive-compulsive disorder: Secondary | ICD-10-CM | POA: Diagnosis not present

## 2021-07-11 DIAGNOSIS — G20 Parkinson's disease: Secondary | ICD-10-CM | POA: Diagnosis not present

## 2021-07-11 DIAGNOSIS — M6389 Disorders of muscle in diseases classified elsewhere, multiple sites: Secondary | ICD-10-CM | POA: Diagnosis not present

## 2021-07-11 DIAGNOSIS — R2689 Other abnormalities of gait and mobility: Secondary | ICD-10-CM | POA: Diagnosis not present

## 2021-07-11 DIAGNOSIS — R41842 Visuospatial deficit: Secondary | ICD-10-CM | POA: Diagnosis not present

## 2021-07-11 DIAGNOSIS — R296 Repeated falls: Secondary | ICD-10-CM | POA: Diagnosis not present

## 2021-07-12 DIAGNOSIS — G20 Parkinson's disease: Secondary | ICD-10-CM | POA: Diagnosis not present

## 2021-07-12 DIAGNOSIS — R296 Repeated falls: Secondary | ICD-10-CM | POA: Diagnosis not present

## 2021-07-12 DIAGNOSIS — F428 Other obsessive-compulsive disorder: Secondary | ICD-10-CM | POA: Diagnosis not present

## 2021-07-12 DIAGNOSIS — R2689 Other abnormalities of gait and mobility: Secondary | ICD-10-CM | POA: Diagnosis not present

## 2021-07-12 DIAGNOSIS — R293 Abnormal posture: Secondary | ICD-10-CM | POA: Diagnosis not present

## 2021-07-12 DIAGNOSIS — M6389 Disorders of muscle in diseases classified elsewhere, multiple sites: Secondary | ICD-10-CM | POA: Diagnosis not present

## 2021-07-12 DIAGNOSIS — R41842 Visuospatial deficit: Secondary | ICD-10-CM | POA: Diagnosis not present

## 2021-07-16 DIAGNOSIS — R296 Repeated falls: Secondary | ICD-10-CM | POA: Diagnosis not present

## 2021-07-16 DIAGNOSIS — M6389 Disorders of muscle in diseases classified elsewhere, multiple sites: Secondary | ICD-10-CM | POA: Diagnosis not present

## 2021-07-16 DIAGNOSIS — F428 Other obsessive-compulsive disorder: Secondary | ICD-10-CM | POA: Diagnosis not present

## 2021-07-16 DIAGNOSIS — R2689 Other abnormalities of gait and mobility: Secondary | ICD-10-CM | POA: Diagnosis not present

## 2021-07-16 DIAGNOSIS — R41842 Visuospatial deficit: Secondary | ICD-10-CM | POA: Diagnosis not present

## 2021-07-16 DIAGNOSIS — R293 Abnormal posture: Secondary | ICD-10-CM | POA: Diagnosis not present

## 2021-07-16 DIAGNOSIS — G20 Parkinson's disease: Secondary | ICD-10-CM | POA: Diagnosis not present

## 2021-07-17 DIAGNOSIS — R41842 Visuospatial deficit: Secondary | ICD-10-CM | POA: Diagnosis not present

## 2021-07-17 DIAGNOSIS — F428 Other obsessive-compulsive disorder: Secondary | ICD-10-CM | POA: Diagnosis not present

## 2021-07-17 DIAGNOSIS — M6389 Disorders of muscle in diseases classified elsewhere, multiple sites: Secondary | ICD-10-CM | POA: Diagnosis not present

## 2021-07-17 DIAGNOSIS — G20 Parkinson's disease: Secondary | ICD-10-CM | POA: Diagnosis not present

## 2021-07-17 DIAGNOSIS — R293 Abnormal posture: Secondary | ICD-10-CM | POA: Diagnosis not present

## 2021-07-17 DIAGNOSIS — R2689 Other abnormalities of gait and mobility: Secondary | ICD-10-CM | POA: Diagnosis not present

## 2021-07-17 DIAGNOSIS — R296 Repeated falls: Secondary | ICD-10-CM | POA: Diagnosis not present

## 2021-07-18 DIAGNOSIS — M6389 Disorders of muscle in diseases classified elsewhere, multiple sites: Secondary | ICD-10-CM | POA: Diagnosis not present

## 2021-07-18 DIAGNOSIS — F428 Other obsessive-compulsive disorder: Secondary | ICD-10-CM | POA: Diagnosis not present

## 2021-07-18 DIAGNOSIS — R41842 Visuospatial deficit: Secondary | ICD-10-CM | POA: Diagnosis not present

## 2021-07-18 DIAGNOSIS — G20 Parkinson's disease: Secondary | ICD-10-CM | POA: Diagnosis not present

## 2021-07-18 DIAGNOSIS — R2689 Other abnormalities of gait and mobility: Secondary | ICD-10-CM | POA: Diagnosis not present

## 2021-07-18 DIAGNOSIS — R293 Abnormal posture: Secondary | ICD-10-CM | POA: Diagnosis not present

## 2021-07-18 DIAGNOSIS — R296 Repeated falls: Secondary | ICD-10-CM | POA: Diagnosis not present

## 2021-07-19 DIAGNOSIS — F428 Other obsessive-compulsive disorder: Secondary | ICD-10-CM | POA: Diagnosis not present

## 2021-07-19 DIAGNOSIS — R296 Repeated falls: Secondary | ICD-10-CM | POA: Diagnosis not present

## 2021-07-19 DIAGNOSIS — M6389 Disorders of muscle in diseases classified elsewhere, multiple sites: Secondary | ICD-10-CM | POA: Diagnosis not present

## 2021-07-19 DIAGNOSIS — G20 Parkinson's disease: Secondary | ICD-10-CM | POA: Diagnosis not present

## 2021-07-19 DIAGNOSIS — R41842 Visuospatial deficit: Secondary | ICD-10-CM | POA: Diagnosis not present

## 2021-07-19 DIAGNOSIS — R293 Abnormal posture: Secondary | ICD-10-CM | POA: Diagnosis not present

## 2021-07-19 DIAGNOSIS — R2689 Other abnormalities of gait and mobility: Secondary | ICD-10-CM | POA: Diagnosis not present

## 2021-07-20 ENCOUNTER — Ambulatory Visit
Admission: RE | Admit: 2021-07-20 | Discharge: 2021-07-20 | Disposition: A | Discharge status: Home / Self Care | Payer: PPO | Source: Ambulatory Visit | Attending: Internal Medicine | Admitting: Internal Medicine

## 2021-07-20 ENCOUNTER — Other Ambulatory Visit: Payer: PPO

## 2021-07-20 DIAGNOSIS — R2689 Other abnormalities of gait and mobility: Secondary | ICD-10-CM | POA: Diagnosis not present

## 2021-07-20 DIAGNOSIS — R531 Weakness: Secondary | ICD-10-CM

## 2021-07-20 DIAGNOSIS — F428 Other obsessive-compulsive disorder: Secondary | ICD-10-CM | POA: Diagnosis not present

## 2021-07-20 DIAGNOSIS — G20 Parkinson's disease: Secondary | ICD-10-CM | POA: Diagnosis not present

## 2021-07-20 DIAGNOSIS — M6389 Disorders of muscle in diseases classified elsewhere, multiple sites: Secondary | ICD-10-CM | POA: Diagnosis not present

## 2021-07-20 DIAGNOSIS — R41842 Visuospatial deficit: Secondary | ICD-10-CM | POA: Diagnosis not present

## 2021-07-20 DIAGNOSIS — R296 Repeated falls: Secondary | ICD-10-CM | POA: Diagnosis not present

## 2021-07-20 DIAGNOSIS — M4802 Spinal stenosis, cervical region: Secondary | ICD-10-CM | POA: Diagnosis not present

## 2021-07-20 DIAGNOSIS — M4312 Spondylolisthesis, cervical region: Secondary | ICD-10-CM | POA: Diagnosis not present

## 2021-07-20 DIAGNOSIS — R293 Abnormal posture: Secondary | ICD-10-CM | POA: Diagnosis not present

## 2021-07-23 DIAGNOSIS — F428 Other obsessive-compulsive disorder: Secondary | ICD-10-CM | POA: Diagnosis not present

## 2021-07-23 DIAGNOSIS — R296 Repeated falls: Secondary | ICD-10-CM | POA: Diagnosis not present

## 2021-07-23 DIAGNOSIS — R2689 Other abnormalities of gait and mobility: Secondary | ICD-10-CM | POA: Diagnosis not present

## 2021-07-23 DIAGNOSIS — G20 Parkinson's disease: Secondary | ICD-10-CM | POA: Diagnosis not present

## 2021-07-23 DIAGNOSIS — R293 Abnormal posture: Secondary | ICD-10-CM | POA: Diagnosis not present

## 2021-07-23 DIAGNOSIS — R41842 Visuospatial deficit: Secondary | ICD-10-CM | POA: Diagnosis not present

## 2021-07-23 DIAGNOSIS — M6389 Disorders of muscle in diseases classified elsewhere, multiple sites: Secondary | ICD-10-CM | POA: Diagnosis not present

## 2021-07-24 DIAGNOSIS — R41842 Visuospatial deficit: Secondary | ICD-10-CM | POA: Diagnosis not present

## 2021-07-24 DIAGNOSIS — M6389 Disorders of muscle in diseases classified elsewhere, multiple sites: Secondary | ICD-10-CM | POA: Diagnosis not present

## 2021-07-24 DIAGNOSIS — R296 Repeated falls: Secondary | ICD-10-CM | POA: Diagnosis not present

## 2021-07-24 DIAGNOSIS — R2689 Other abnormalities of gait and mobility: Secondary | ICD-10-CM | POA: Diagnosis not present

## 2021-07-24 DIAGNOSIS — R293 Abnormal posture: Secondary | ICD-10-CM | POA: Diagnosis not present

## 2021-07-24 DIAGNOSIS — F428 Other obsessive-compulsive disorder: Secondary | ICD-10-CM | POA: Diagnosis not present

## 2021-07-24 DIAGNOSIS — G20 Parkinson's disease: Secondary | ICD-10-CM | POA: Diagnosis not present

## 2021-07-25 DIAGNOSIS — R296 Repeated falls: Secondary | ICD-10-CM | POA: Diagnosis not present

## 2021-07-25 DIAGNOSIS — Z85828 Personal history of other malignant neoplasm of skin: Secondary | ICD-10-CM | POA: Diagnosis not present

## 2021-07-25 DIAGNOSIS — R2689 Other abnormalities of gait and mobility: Secondary | ICD-10-CM | POA: Diagnosis not present

## 2021-07-25 DIAGNOSIS — Z85820 Personal history of malignant melanoma of skin: Secondary | ICD-10-CM | POA: Diagnosis not present

## 2021-07-25 DIAGNOSIS — F428 Other obsessive-compulsive disorder: Secondary | ICD-10-CM | POA: Diagnosis not present

## 2021-07-25 DIAGNOSIS — G20 Parkinson's disease: Secondary | ICD-10-CM | POA: Diagnosis not present

## 2021-07-25 DIAGNOSIS — L820 Inflamed seborrheic keratosis: Secondary | ICD-10-CM | POA: Diagnosis not present

## 2021-07-25 DIAGNOSIS — D485 Neoplasm of uncertain behavior of skin: Secondary | ICD-10-CM | POA: Diagnosis not present

## 2021-07-25 DIAGNOSIS — R293 Abnormal posture: Secondary | ICD-10-CM | POA: Diagnosis not present

## 2021-07-25 DIAGNOSIS — L570 Actinic keratosis: Secondary | ICD-10-CM | POA: Diagnosis not present

## 2021-07-25 DIAGNOSIS — R41842 Visuospatial deficit: Secondary | ICD-10-CM | POA: Diagnosis not present

## 2021-07-25 DIAGNOSIS — M6389 Disorders of muscle in diseases classified elsewhere, multiple sites: Secondary | ICD-10-CM | POA: Diagnosis not present

## 2021-07-26 ENCOUNTER — Non-Acute Institutional Stay (SKILLED_NURSING_FACILITY): Payer: PPO | Admitting: Adult Health

## 2021-07-26 ENCOUNTER — Encounter: Payer: Self-pay | Admitting: Adult Health

## 2021-07-26 DIAGNOSIS — R230 Cyanosis: Secondary | ICD-10-CM

## 2021-07-26 DIAGNOSIS — R5383 Other fatigue: Secondary | ICD-10-CM

## 2021-07-26 DIAGNOSIS — U071 COVID-19: Secondary | ICD-10-CM

## 2021-07-26 DIAGNOSIS — R296 Repeated falls: Secondary | ICD-10-CM | POA: Diagnosis not present

## 2021-07-26 DIAGNOSIS — R2689 Other abnormalities of gait and mobility: Secondary | ICD-10-CM | POA: Diagnosis not present

## 2021-07-26 DIAGNOSIS — F428 Other obsessive-compulsive disorder: Secondary | ICD-10-CM | POA: Diagnosis not present

## 2021-07-26 DIAGNOSIS — R41842 Visuospatial deficit: Secondary | ICD-10-CM | POA: Diagnosis not present

## 2021-07-26 DIAGNOSIS — R4182 Altered mental status, unspecified: Secondary | ICD-10-CM | POA: Diagnosis not present

## 2021-07-26 DIAGNOSIS — M4802 Spinal stenosis, cervical region: Secondary | ICD-10-CM

## 2021-07-26 DIAGNOSIS — G20 Parkinson's disease: Secondary | ICD-10-CM | POA: Diagnosis not present

## 2021-07-26 DIAGNOSIS — M6389 Disorders of muscle in diseases classified elsewhere, multiple sites: Secondary | ICD-10-CM | POA: Diagnosis not present

## 2021-07-26 DIAGNOSIS — R293 Abnormal posture: Secondary | ICD-10-CM | POA: Diagnosis not present

## 2021-07-26 MED ORDER — NIRMATRELVIR/RITONAVIR (PAXLOVID) TABLET (RENAL DOSING): 2.0000 | ORAL_TABLET | Freq: Two times a day (BID) | ORAL | 0 refills | Status: DC

## 2021-07-26 NOTE — Progress Notes (Signed)
?Location:  [facility] ?  ?Place of Service:  SNF (31) ?Provider:   ?Cindi Carbon, ANP ?[facility] ?[phone] ? ?[name], MD ? ?Patient Care Team: ?[name], MD as PCP - General (Internal Medicine) ?[name], [name], MD as PCP - Cardiology (Cardiology) ?[name], [name], MD as Consulting Physician (Cardiology) ?[name], MD as Attending Physician (Gastroenterology) ?[name], MD (Inactive) as Consulting Physician (Neurology) ?Community[facility] ([place]) ?Royal Hawthorn, NP as Nurse Practitioner (Nurse Practitioner) ? ?Extended Emergency Contact Information ?Primary Emergency Contact: Worlds,Nancy C ?Address: [address] ?         [place], [place] [address] [place] ?Home Phone: [phone] ?Mobile Phone: [phone] ?Relation: Spouse ?Secondary Emergency Contact: [name] ?Address: [address] ?         [place], [place] [address] [place] ?Mobile Phone: [phone] ?Relation: Daughter ? ?Code Status:  DNR ?Goals of care: Advanced Directive information ? ?  06/28/2021  ?  1:54 PM  ?Advanced Directives  ?Does Patient Have a Medical Advance Directive? Yes  ?Type of Paramedic of [place];Living will;Out of facility DNR (pink MOST or yellow form)  ?Does patient want to make changes to medical advance directive? No - Patient declined  ?Copy of [place] in Chart? Yes - validated most recent copy scanned in chart (See row information)  ?Pre-existing out of facility DNR order (yellow form or pink MOST form) Yellow form placed in chart (order not valid for inpatient use)  ? ? ? ?Chief Complaint  ?Patient presents with  ? Acute Visit  ?  Confusion, cyanosis  ? ? ?HPI:  ?Pt is a 88 y.o. male seen today for an acute visit for confusion and cyanosis. This am the nurse reported that his fingers to both hands and both feet were bluish  discoloration. He did not have any sob or cough. 02 sats are WNL. I asked that the nurse check a covid swab which returned positive. He has had sick contacts with covid on his unit at [facility].  ?Received last Bivalent booster 01/17/21 ?I also reviewed his CT scan of the cervical area and head done 4/14 for issues with numbness and tingling to both hands, worse on the left. He also has weakness to both arms and is not able to feed himself. Worse on the left as well.  ?CT of the head showed no acute infarcts, old lacunar infarct noted. Cervical CT showed severe stenosis at C2-3 and degenerative spinal cord mass effect is suspected.  ?He is not having any neck pain. He does have some pain in the left hand as it is contracting and he is wearing a splint at night. He is also working with OT. ? ? ? ?Past Medical History:  ?Diagnosis Date  ? Cellulitis   ? Chronic diastolic CHF (congestive heart failure) ([place])   ? a. EF initially 35-40% after MI 1/09; b. echo 7/08: EF 60%;  c. 11/2014 Echo: EF 65-70%, Gr 1 DD, mild MR.  ? Coronary artery disease   ? a. s/p anterior STEMI 04/2007 with BMS-> LAD;  b. Cath 12/2011 patent stent;  c. low risk nuc 04/2014.  ? Crohn's disease ([place])   ? Diaphragmatic hernia without mention of obstruction or gangrene   ? Diverticulosis of colon (without mention of hemorrhage)   ? Fatty liver   ? a. on ultrasound of 10/2009  ? Flatulence,  eructation, and gas pain   ? Fungal infection   ? Gait disorder   ? GERD (gastroesophageal reflux disease)   ? HOH (hard of hearing)   ? Hearing aids  ? Hyperlipidemia   ? Hypertension   ? Ischemic cardiomyopathy   ? a. EF initially 35-40% after MI 1/09; b. echo 7/08: EF 60%;  c. 11/2014 Echo: EF 65-70%, Gr 1 DD, mild MR.  ? Melanoma ([place])   ? Memory disorder   ? Orthostatic hypotension   ? Osteoporosis   ? Other chronic nonalcoholic liver disease   ? Other esophagitis   ? Parkinson's disease ([place])   ? RBBB 09/02/2013  ? Regional enteritis of small intestine ([place])   ?  Renal calculi   ? Restless leg syndrome 03/22/2015  ? Ulcerative (chronic) ileocolitis ([place])   ? Urinary incontinence   ? ?Past Surgical History:  ?Procedure Laterality Date  ? APPENDECTOMY    ? BACK SURGERY    ? L3, L4, L5  ? CARDIAC CATHETERIZATION  09/25/06  ? EF 35-40% but more recently 60%  ? CATARACT EXTRACTION    ? Bilateral  ? CHOLECYSTECTOMY    ? Coronary artery stent placement    ? Melanoma resection    ? Partial bowel resection    ? TONSILLECTOMY    ? ? ?No Known Allergies ? ?Outpatient Encounter Medications as of 07/26/2021  ?Medication Sig  ? nirmatrelvir/ritonavir EUA, renal dosing, (PAXLOVID) 10 x 150 MG & 10 x 100MG TABS Take 2 tablets by mouth 2 (two) times daily for 5 days. (Take nirmatrelvir 150 mg one tablet twice daily for 5 days and ritonavir 100 mg one tablet twice daily for 5 days) Patient GFR is 47.8  ? acetaminophen (TYLENOL) 325 MG tablet Take 650 mg by mouth at bedtime. 8 pm scheduled and every 6 hours as needed throughout the day  ? aspirin EC 81 MG tablet Take 81 mg by mouth daily.  ? atorvastatin (LIPITOR) 40 MG tablet Take 40 mg by mouth daily.  ? calcium carbonate (OS-CAL) 600 MG TABS tablet Take 600 mg by mouth daily with breakfast.  ? carbidopa-levodopa (SINEMET CR) 50-200 MG tablet Take 1 tablet by mouth 4 (four) times daily.  ? carbidopa-levodopa (SINEMET IR) 25-100 MG tablet Take 1 tablet by mouth 4 (four) times daily.  ? cetirizine (ZYRTEC) 10 MG tablet Take 1 tablet (10 mg total) by mouth at bedtime.  ? Cholecalciferol (VITAMIN D3) 50 MCG (2000 UT) TABS Take 1 tablet by mouth daily.  ? cholestyramine (QUESTRAN) 4 g packet TAKE 1 PACKET BY MOUTH TWICE DAILY WITH A MEAL  ? diclofenac Sodium (VOLTAREN) 1 % GEL Apply topically 3 (three) times daily as needed (Apply to the affected right ankle).  ? donepezil (ARICEPT) 10 MG tablet TAKE ONE TABLET AT BEDTIME  ? ipratropium (ATROVENT) 0.03 % nasal spray Place 2 sprays into both nostrils 3 (three) times daily.  ? lidocaine (XYLOCAINE) 2  % solution Use as directed 15 mLs in the mouth or throat as needed for mouth pain.  ? midodrine (PROAMATINE) 5 MG tablet Take 1 tablet (5 mg total) by mouth 2 (two) times daily with a meal.  ? multivitamin-iron-minerals-folic acid (CENTRUM) chewable tablet Chew 1 tablet by mouth daily.  ? nitroGLYCERIN (NITROSTAT) 0.4 MG SL tablet Place 1 tablet (0.4 mg total) under the tongue every 5 (five) minutes as needed for chest pain. PT OVERDUE FOR OV PLEASE CALL FOR APPT  ? pantoprazole (PROTONIX) 40 MG tablet Take  40 mg by mouth daily.  ? PARoxetine (PAXIL) 20 MG tablet Take 20 mg by mouth 2 (two) times daily.  ? potassium chloride SA (K-DUR,KLOR-CON) 20 MEQ tablet Take 40 mEq by mouth daily.  ? Probiotic Product (ALIGN) 4 MG CAPS Take 1 capsule by mouth daily.   ? sodium chloride (OCEAN) 0.65 % SOLN nasal spray Place 2 sprays into both nostrils daily as needed for congestion.   ? torsemide (DEMADEX) 20 MG tablet Take 1 tablet (20 mg total) by mouth daily.  ? vitamin B-12 (CYANOCOBALAMIN) 1000 MCG tablet Take 1,000 mcg by mouth daily.  ? ?No facility-administered encounter medications on file as of 07/26/2021.  ? ? ?Review of Systems  ?Constitutional:  Positive for activity change, appetite change and fatigue. Negative for chills, diaphoresis, fever and unexpected weight change.  ?HENT:  Positive for congestion. Negative for sore throat and trouble swallowing.   ?Eyes:  Negative for visual disturbance.  ?Respiratory:  Negative for cough, shortness of breath, wheezing and stridor.   ?Cardiovascular:  Negative for chest pain, palpitations and leg swelling.  ?Gastrointestinal:  Negative for abdominal distention, abdominal pain, constipation and diarrhea.  ?Genitourinary:  Negative for difficulty urinating and dysuria.  ?Musculoskeletal:  Positive for gait problem. Negative for arthralgias, back pain, joint swelling and myalgias.  ?     Left hand pain  ?  ?Neurological:  Positive for weakness and numbness. Negative for  dizziness, seizures, syncope, facial asymmetry, speech difficulty and headaches.  ?Hematological:  Negative for adenopathy. Does not bruise/bleed easily.  ?Psychiatric/Behavioral:  Positive for confusion. Neg

## 2021-07-27 DIAGNOSIS — R230 Cyanosis: Secondary | ICD-10-CM | POA: Diagnosis not present

## 2021-07-27 DIAGNOSIS — R4182 Altered mental status, unspecified: Secondary | ICD-10-CM | POA: Diagnosis not present

## 2021-07-27 LAB — BASIC METABOLIC PANEL
BUN: 25 — AB (ref 4–21)
CO2: 26 — AB (ref 13–22)
Chloride: 101 (ref 99–108)
Creatinine: 1.2 (ref 0.6–1.3)
Glucose: 98
Potassium: 3.7 mEq/L (ref 3.5–5.1)
Sodium: 143 (ref 137–147)

## 2021-07-27 LAB — CBC AND DIFFERENTIAL
HCT: 39 — AB (ref 41–53)
Hemoglobin: 13.3 — AB (ref 13.5–17.5)
Platelets: 191 10*3/uL (ref 150–400)
WBC: 6.5

## 2021-07-27 LAB — CBC: RBC: 4.19 (ref 3.87–5.11)

## 2021-07-27 LAB — COMPREHENSIVE METABOLIC PANEL: Calcium: 8.4 — AB (ref 8.7–10.7)

## 2021-07-28 DIAGNOSIS — N390 Urinary tract infection, site not specified: Secondary | ICD-10-CM | POA: Diagnosis not present

## 2021-07-31 ENCOUNTER — Non-Acute Institutional Stay (SKILLED_NURSING_FACILITY): Payer: PPO | Admitting: Orthopedic Surgery

## 2021-07-31 ENCOUNTER — Encounter: Payer: Self-pay | Admitting: Orthopedic Surgery

## 2021-07-31 DIAGNOSIS — M4802 Spinal stenosis, cervical region: Secondary | ICD-10-CM | POA: Diagnosis not present

## 2021-07-31 DIAGNOSIS — R4182 Altered mental status, unspecified: Secondary | ICD-10-CM | POA: Diagnosis not present

## 2021-07-31 DIAGNOSIS — M545 Low back pain, unspecified: Secondary | ICD-10-CM

## 2021-07-31 DIAGNOSIS — U071 COVID-19: Secondary | ICD-10-CM

## 2021-07-31 DIAGNOSIS — R29898 Other symptoms and signs involving the musculoskeletal system: Secondary | ICD-10-CM

## 2021-07-31 MED ORDER — ACETAMINOPHEN 500 MG PO TABS: 1000.0000 mg | ORAL_TABLET | Freq: Three times a day (TID) | ORAL | 0 refills | Status: DC | PRN

## 2021-07-31 MED ORDER — GUAIFENESIN ER 600 MG PO TB12: 600.0000 mg | ORAL_TABLET | Freq: Two times a day (BID) | ORAL | 0 refills | Status: AC

## 2021-07-31 NOTE — Progress Notes (Signed)
?Location:  [facility] ?Nursing Home Room Number: 133/A ?Place of Service:  SNF (31) ?Provider: Yvonna Alanis, NP ? ?Patient Care Team: ?[name], MD as PCP - General (Internal Medicine) ?[name], [name], MD as PCP - Cardiology (Cardiology) ?[name], [name], MD as Consulting Physician (Cardiology) ?[name], MD as Attending Physician (Gastroenterology) ?[name], MD (Inactive) as Consulting Physician (Neurology) ?Community[facility] ([place]) ?Royal Hawthorn, NP as Nurse Practitioner (Nurse Practitioner) ? ?Extended Emergency Contact Information ?Primary Emergency Contact: Thornberry,Nancy C ?Address: [address] ?         [place], [place] [address] [place] ?Home Phone: [phone] ?Mobile Phone: [phone] ?Relation: Spouse ?Secondary Emergency Contact: [name] ?Address: [address] ?         [place], [place] [address] [place] ?Mobile Phone: [phone] ?Relation: Daughter ? ?Code Status:  DNR ?Goals of care: Advanced Directive information ? ?  07/31/2021  ? 10:39 AM  ?Advanced Directives  ?Does Patient Have a Medical Advance Directive? Yes  ?Type of Paramedic of [place];Living will;Out of facility DNR (pink MOST or yellow form)  ?Does patient want to make changes to medical advance directive? No - Patient declined  ?Copy of [place] in Chart? Yes - validated most recent copy scanned in chart (See row information)  ?Pre-existing out of facility DNR order (yellow form or pink MOST form) Yellow form placed in chart (order not valid for inpatient use)  ? ? ? ?Chief Complaint  ?Patient presents with  ? Acute Visit  ?  Back pain  ? ? ?HPI:  ?Pt is a 88 y.o. [name] seen today for an acute visit for back pain.  ? ?Increased lower back pain reported 04/24. R>L. No radiation. Lumbar x-ray ordered by oncall provider. Reports increased pain with certain  positions. Denies pain at this time. H/o cervical spinal stenosis. 04/14 CT spine showed severe stenosis at C2-C3 and suspected degenerative spinal cord mass effect. He continues to have left hand contracture/weakness. Working with OT on splinting. Goals of care to avoid hospitalizations.  ? ?04/20 he tested positive for covid. Symptoms include: sore throat, low grade fever, and congestion. Paxlovid started 04/20. Remains on isolation precautions. Reports productive cough this morning. He is having trouble coughing up sputum at times.  ? ? ? ? ?Past Medical History:  ?Diagnosis Date  ? Cellulitis   ? Chronic diastolic CHF (congestive heart failure) ([place])   ? a. EF initially 35-40% after MI 1/09; b. echo 7/08: EF 60%;  c. 11/2014 Echo: EF 65-[name]%, Gr 1 DD, mild MR.  ? Coronary artery disease   ? a. s/p anterior STEMI 04/2007 with BMS-> LAD;  b. Cath 12/2011 patent stent;  c. low risk nuc 04/2014.  ? Crohn's disease ([place])   ? Diaphragmatic hernia without mention of obstruction or gangrene   ? Diverticulosis of colon (without mention of hemorrhage)   ? Fatty liver   ? a. on ultrasound of 10/2009  ? Flatulence, eructation, and gas pain   ? Fungal infection   ? Gait disorder   ? GERD (gastroesophageal reflux disease)   ? HOH (hard of hearing)   ? Hearing aids  ? Hyperlipidemia   ? Hypertension   ? Ischemic cardiomyopathy   ? a. EF initially 35-40% after MI 1/09; b. echo 7/08: EF 60%;  c. 11/2014 Echo: EF 65-[name]%, Gr 1 DD, mild MR.  ? Melanoma ([place])   ?  Memory disorder   ? Orthostatic hypotension   ? Osteoporosis   ? Other chronic nonalcoholic liver disease   ? Other esophagitis   ? Parkinson's disease ([place])   ? RBBB 09/02/2013  ? Regional enteritis of small intestine ([place])   ? Renal calculi   ? Restless leg syndrome 03/22/2015  ? Ulcerative (chronic) ileocolitis ([place])   ? Urinary incontinence   ? ?Past Surgical History:  ?Procedure Laterality Date  ? APPENDECTOMY    ? BACK SURGERY    ? L3, L4, L5  ? CARDIAC CATHETERIZATION   09/25/06  ? EF 35-40% but more recently 60%  ? CATARACT EXTRACTION    ? Bilateral  ? CHOLECYSTECTOMY    ? Coronary artery stent placement    ? Melanoma resection    ? Partial bowel resection    ? TONSILLECTOMY    ? ? ?No Known Allergies ? ?Outpatient Encounter Medications as of 07/31/2021  ?Medication Sig  ? acetaminophen (TYLENOL) 325 MG tablet Take 650 mg by mouth at bedtime. 8 pm scheduled and every 6 hours as needed throughout the day  ? aspirin EC 81 MG tablet Take 81 mg by mouth daily.  ? atorvastatin (LIPITOR) 40 MG tablet Take 40 mg by mouth daily.  ? calcium carbonate (OS-CAL) 600 MG TABS tablet Take 600 mg by mouth daily with breakfast.  ? carbidopa-levodopa (SINEMET CR) 50-200 MG tablet Take 1 tablet by mouth 4 (four) times daily.  ? carbidopa-levodopa (SINEMET IR) 25-100 MG tablet Take 1 tablet by mouth 4 (four) times daily.  ? cetirizine (ZYRTEC) 10 MG tablet Take 1 tablet (10 mg total) by mouth at bedtime.  ? Cholecalciferol (VITAMIN D3) 50 MCG (2000 UT) TABS Take 1 tablet by mouth daily.  ? cholestyramine (QUESTRAN) 4 g packet TAKE 1 PACKET BY MOUTH TWICE DAILY WITH A MEAL  ? diclofenac Sodium (VOLTAREN) 1 % GEL Apply topically 3 (three) times daily as needed (Apply to the affected right ankle).  ? donepezil (ARICEPT) 10 MG tablet TAKE ONE TABLET AT BEDTIME  ? ipratropium (ATROVENT) 0.03 % nasal spray Place 2 sprays into both nostrils 3 (three) times daily.  ? lidocaine (XYLOCAINE) 2 % solution Use as directed 15 mLs in the mouth or throat as needed for mouth pain.  ? midodrine (PROAMATINE) 5 MG tablet Take 1 tablet (5 mg total) by mouth 2 (two) times daily with a meal.  ? multivitamin-iron-minerals-folic acid (CENTRUM) chewable tablet Chew 1 tablet by mouth daily.  ? nitroGLYCERIN (NITROSTAT) 0.4 MG SL tablet Place 1 tablet (0.4 mg total) under the tongue every 5 (five) minutes as needed for chest pain. PT OVERDUE FOR OV PLEASE CALL FOR APPT  ? pantoprazole (PROTONIX) 40 MG tablet Take 40 mg by mouth  daily.  ? PARoxetine (PAXIL) 20 MG tablet Take 20 mg by mouth 2 (two) times daily.  ? potassium chloride SA (K-DUR,KLOR-CON) 20 MEQ tablet Take 40 mEq by mouth daily.  ? Probiotic Product (ALIGN) 4 MG CAPS Take 1 capsule by mouth daily.   ? sodium chloride (OCEAN) 0.65 % SOLN nasal spray Place 2 sprays into both nostrils daily as needed for congestion.   ? torsemide (DEMADEX) 20 MG tablet Take 1 tablet (20 mg total) by mouth daily.  ? vitamin B-12 (CYANOCOBALAMIN) 1000 MCG tablet Take 1,000 mcg by mouth daily.  ? [DISCONTINUED] nirmatrelvir/ritonavir EUA, renal dosing, (PAXLOVID) 10 x 150 MG & 10 x 100MG TABS Take 2 tablets by mouth 2 (two) times daily for 5 days. (  Take nirmatrelvir 150 mg one tablet twice daily for 5 days and ritonavir 100 mg one tablet twice daily for 5 days) Patient GFR is 47.8  ? ?No facility-administered encounter medications on file as of 07/31/2021.  ? ? ?Review of Systems  ?Constitutional:  Positive for activity change, appetite change and fever. Negative for chills and fatigue.  ?HENT:  Positive for congestion and sore throat.   ?Eyes:  Negative for visual disturbance.  ?Respiratory:  Positive for cough. Negative for shortness of breath and wheezing.   ?Cardiovascular:  Negative for chest pain and leg swelling.  ?Gastrointestinal:  Negative for abdominal distention, abdominal pain, constipation, diarrhea and nausea.  ?Genitourinary:  Negative for dysuria, frequency and hematuria.  ?Musculoskeletal:  Positive for arthralgias, back pain and gait problem.  ?Skin:  Negative for wound.  ?Neurological:  Positive for weakness. Negative for dizziness and headaches.  ?Psychiatric/Behavioral:  Positive for confusion. Negative for decreased concentration and dysphoric mood. The patient is not nervous/anxious.   ? ?Immunization History  ?Administered Date(s) Administered  ? Influenza, High Dose Seasonal PF 01/21/2019, 01/10/2021  ? Influenza-Unspecified 12/06/2014, 11/27/2017, 02/01/2020  ? Moderna  Covid-19 Vaccine Bivalent Booster 62yr & up 01/17/2021  ? Moderna SARS-COV2 Booster Vaccination 11/28/2020  ? Moderna Sars-Covid-2 Vaccination 04/13/2019, 05/11/2019, 02/18/2020  ? Pneumococcal Conjugate-13 02/13/

## 2021-08-02 ENCOUNTER — Non-Acute Institutional Stay (SKILLED_NURSING_FACILITY): Payer: PPO | Admitting: Adult Health

## 2021-08-02 ENCOUNTER — Encounter: Payer: Self-pay | Admitting: Adult Health

## 2021-08-02 DIAGNOSIS — J9601 Acute respiratory failure with hypoxia: Secondary | ICD-10-CM

## 2021-08-02 DIAGNOSIS — H00014 Hordeolum externum left upper eyelid: Secondary | ICD-10-CM | POA: Diagnosis not present

## 2021-08-02 DIAGNOSIS — U071 COVID-19: Secondary | ICD-10-CM

## 2021-08-02 DIAGNOSIS — M545 Low back pain, unspecified: Secondary | ICD-10-CM

## 2021-08-02 MED ORDER — LEVALBUTEROL TARTRATE 45 MCG/ACT IN AERO: 5.0000 | INHALATION_SPRAY | Freq: Four times a day (QID) | RESPIRATORY_TRACT | 12 refills | Status: DC | PRN

## 2021-08-02 MED ORDER — DEXAMETHASONE 6 MG PO TABS: 6.0000 mg | ORAL_TABLET | Freq: Two times a day (BID) | ORAL | 0 refills | Status: DC

## 2021-08-02 NOTE — Progress Notes (Signed)
?Location:   [facility] Room Number: 133 A ?Place of Service:  SNF (31) ?Provider:  Royal Hawthorn, NP ? ?[name], MD ? ?Patient Care Team: ?[name], MD as PCP - General (Internal Medicine) ?[name], [name], MD as PCP - Cardiology (Cardiology) ?[name], [name], MD as Consulting Physician (Cardiology) ?[name], MD as Attending Physician (Gastroenterology) ?[name], MD (Inactive) as Consulting Physician (Neurology) ?Community[facility] ([place]) ?Royal Hawthorn, NP as Nurse Practitioner (Nurse Practitioner) ? ?Extended Emergency Contact Information ?Primary Emergency Contact: Kornegay,Nancy C ?Address: [address] ?         [place], [place] [address] [place] ?Home Phone: [phone] ?Mobile Phone: [phone] ?Relation: Spouse ?Secondary Emergency Contact: [name] ?Address: [address] ?         [place], [place] [address] [place] ?Mobile Phone: [phone] ?Relation: Daughter ? ?Code Status:  DNR ?Goals of care: Advanced Directive information ? ?  08/02/2021  ? 10:43 AM  ?Advanced Directives  ?Does Patient Have a Medical Advance Directive? Yes  ?Type of Paramedic of [place];Living will;Out of facility DNR (pink MOST or yellow form)  ?Does patient want to make changes to medical advance directive? No - Patient declined  ?Copy of [place] in Chart? Yes - validated most recent copy scanned in chart (See row information)  ?Pre-existing out of facility DNR order (yellow form or pink MOST form) Yellow form placed in chart (order not valid for inpatient use)  ? ? ? ?Chief Complaint  ?Patient presents with  ? Acute Visit  ?  Eye drainage  ? ? ?HPI:  ?Pt is a 88 y.o. male seen today for an acute visit for eye drainage.  ? ?The nurse wrote an SBAR reporting a "stye" and some eye drainage on the left. He is on isolation due to  covid diagnosed by rapid test with associated symptoms of lethargy and blue discolored toes with normal 02 sats on 4/20. Given 5 days of Paxlovid.  ? ?Upon entering the room he is in bed and feels uncomfortable in the bed and needs to be repositioned. He is alert and following commands. He says his back is no longer hurting but that his right hip hurts since he has a pillow on that side. He denies any radicular pain or change in bowel and bladder habits. He has had a void and BM this morning.  ?He also has a poor appetite.  ? ?Other issue is that he has had some generally weakness of the upper extremities and has needed more help with meals and ADls. Weakness is worse on the left arm with also some numbness and tingling. He denies any neck pain.  ?04/14 CT spine showed severe stenosis at C2-C3 and suspected degenerative spinal cord mass effect ? ?Past Medical History:  ?Diagnosis Date  ? Cellulitis   ? Chronic diastolic CHF (congestive heart failure) ([place])   ? a. EF initially 35-40% after MI 1/09; b. echo 7/08: EF 60%;  c. 11/2014 Echo: EF 65-70%, Gr 1 DD, mild MR.  ? Coronary artery disease   ? a. s/p anterior STEMI 04/2007 with BMS-> LAD;  b. Cath 12/2011 patent stent;  c. low risk nuc 04/2014.  ? Crohn's disease ([place])   ? Diaphragmatic hernia without mention of obstruction or gangrene   ? Diverticulosis of colon (without mention of hemorrhage)   ? Fatty liver   ?  a. on ultrasound of 10/2009  ? Flatulence, eructation, and gas pain   ? Fungal infection   ? Gait disorder   ? GERD (gastroesophageal reflux disease)   ? HOH (hard of hearing)   ? Hearing aids  ? Hyperlipidemia   ? Hypertension   ? Ischemic cardiomyopathy   ? a. EF initially 35-40% after MI 1/09; b. echo 7/08: EF 60%;  c. 11/2014 Echo: EF 65-70%, Gr 1 DD, mild MR.  ? Melanoma ([place])   ? Memory disorder   ? Orthostatic hypotension   ? Osteoporosis   ? Other chronic nonalcoholic liver disease   ? Other esophagitis   ? Parkinson's disease ([place])   ? RBBB 09/02/2013   ? Regional enteritis of small intestine ([place])   ? Renal calculi   ? Restless leg syndrome 03/22/2015  ? Ulcerative (chronic) ileocolitis ([place])   ? Urinary incontinence   ? ?Past Surgical History:  ?Procedure Laterality Date  ? APPENDECTOMY    ? BACK SURGERY    ? L3, L4, L5  ? CARDIAC CATHETERIZATION  09/25/06  ? EF 35-40% but more recently 60%  ? CATARACT EXTRACTION    ? Bilateral  ? CHOLECYSTECTOMY    ? Coronary artery stent placement    ? Melanoma resection    ? Partial bowel resection    ? TONSILLECTOMY    ? ? ?No Known Allergies ? ?Allergies as of 08/02/2021   ?No Known Allergies ?  ? ?  ?Medication List  ?  ? ?  ? Accurate as of August 02, 2021  1:10 PM. If you have any questions, ask your nurse or doctor.  ?  ?  ? ?  ? ?acetaminophen 325 MG tablet ?Commonly known as: TYLENOL ?Take 650 mg by mouth at bedtime. ?  ?acetaminophen 500 MG tablet ?Commonly known as: TYLENOL ?Take 2 tablets (1,000 mg total) by mouth 3 (three) times daily as needed. ?  ?Align 4 MG Caps ?Take 1 capsule by mouth daily. ?  ?aspirin EC 81 MG tablet ?Take 81 mg by mouth daily. ?  ?atorvastatin 40 MG tablet ?Commonly known as: LIPITOR ?Take 40 mg by mouth daily. ?  ?calcium carbonate 600 MG Tabs tablet ?Commonly known as: OS-CAL ?Take 600 mg by mouth daily with breakfast. ?  ?carbidopa-levodopa 25-100 MG tablet ?Commonly known as: SINEMET IR ?Take 1 tablet by mouth 4 (four) times daily. ?  ?carbidopa-levodopa 50-200 MG tablet ?Commonly known as: SINEMET CR ?Take 1 tablet by mouth 4 (four) times daily. ?  ?cetirizine 10 MG tablet ?Commonly known as: ZYRTEC ?Take 1 tablet (10 mg total) by mouth at bedtime. ?  ?cholestyramine 4 g packet ?Commonly known as: QUESTRAN ?TAKE 1 PACKET BY MOUTH TWICE DAILY WITH A MEAL ?  ?dexamethasone 6 MG tablet ?Commonly known as: DECADRON ?Take 1 tablet (6 mg total) by mouth 2 (two) times daily with a meal. ?Started by: Royal Hawthorn, NP ?  ?diclofenac Sodium 1 % Gel ?Commonly known as: VOLTAREN ?Apply topically 3  (three) times daily as needed (Apply to the affected right ankle). ?  ?donepezil 10 MG tablet ?Commonly known as: ARICEPT ?TAKE ONE TABLET AT BEDTIME ?  ?guaiFENesin 600 MG 12 hr tablet ?Commonly known as: Mucinex ?Take 1 tablet (600 mg total) by mouth 2 (two) times daily for 5 days. ?  ?ipratropium 0.03 % nasal spray ?Commonly known as: ATROVENT ?Place 2 sprays into both nostrils 3 (three) times daily. ?  ?levalbuterol 45 MCG/ACT inhaler ?Commonly known as: Xopenex  HFA ?Inhale 5 puffs into the lungs every 6 (six) hours as needed for wheezing. ?Started by: Royal Hawthorn, NP ?  ?lidocaine 2 % solution ?Commonly known as: XYLOCAINE ?Use as directed 15 mLs in the mouth or throat as needed for mouth pain. ?  ?midodrine 5 MG tablet ?Commonly known as: PROAMATINE ?Take 1 tablet (5 mg total) by mouth 2 (two) times daily with a meal. ?  ?multivitamin-iron-minerals-folic acid chewable tablet ?Chew 1 tablet by mouth daily. ?  ?nitroGLYCERIN 0.4 MG SL tablet ?Commonly known as: NITROSTAT ?Place 1 tablet (0.4 mg total) under the tongue every 5 (five) minutes as needed for chest pain. PT OVERDUE FOR OV PLEASE CALL FOR APPT ?  ?pantoprazole 40 MG tablet ?Commonly known as: PROTONIX ?Take 40 mg by mouth daily. ?  ?PARoxetine 20 MG tablet ?Commonly known as: PAXIL ?Take 20 mg by mouth 2 (two) times daily. ?  ?potassium chloride SA 20 MEQ tablet ?Commonly known as: KLOR-CON M ?Take 40 mEq by mouth daily. ?  ?sodium chloride 0.65 % Soln nasal spray ?Commonly known as: OCEAN ?Place 2 sprays into both nostrils daily as needed for congestion. ?  ?torsemide 20 MG tablet ?Commonly known as: DEMADEX ?Take 1 tablet (20 mg total) by mouth daily. ?  ?vitamin B-12 1000 MCG tablet ?Commonly known as: CYANOCOBALAMIN ?Take 1,000 mcg by mouth daily. ?  ?Vitamin D3 50 MCG (2000 UT) Tabs ?Take 1 tablet by mouth daily. ?  ? ?  ? ? ?Review of Systems  ?Constitutional:  Positive for activity change and fatigue. Negative for appetite change, chills,  diaphoresis, fever and unexpected weight change.  ?HENT:  Positive for congestion (chronic). Negative for sore throat and trouble swallowing.   ?Respiratory:  Positive for wheezing. Negative for cough, sh

## 2021-08-03 DIAGNOSIS — U071 COVID-19: Secondary | ICD-10-CM | POA: Diagnosis not present

## 2021-08-03 LAB — CBC: RBC: 4.21 (ref 3.87–5.11)

## 2021-08-03 LAB — BASIC METABOLIC PANEL
BUN: 23 — AB (ref 4–21)
CO2: 23 — AB (ref 13–22)
Chloride: 102 (ref 99–108)
Creatinine: 1 (ref 0.6–1.3)
Glucose: 133
Potassium: 3.9 mEq/L (ref 3.5–5.1)
Sodium: 141 (ref 137–147)

## 2021-08-03 LAB — COMPREHENSIVE METABOLIC PANEL: Calcium: 8.7 (ref 8.7–10.7)

## 2021-08-03 LAB — CBC AND DIFFERENTIAL
HCT: 39 — AB (ref 41–53)
Hemoglobin: 13.1 — AB (ref 13.5–17.5)
Platelets: 260 10*3/uL (ref 150–400)
WBC: 10.5

## 2021-08-06 ENCOUNTER — Non-Acute Institutional Stay (SKILLED_NURSING_FACILITY): Payer: PPO | Admitting: Adult Health

## 2021-08-06 ENCOUNTER — Encounter: Payer: Self-pay | Admitting: Adult Health

## 2021-08-06 DIAGNOSIS — U071 COVID-19: Secondary | ICD-10-CM | POA: Diagnosis not present

## 2021-08-06 DIAGNOSIS — R293 Abnormal posture: Secondary | ICD-10-CM | POA: Diagnosis not present

## 2021-08-06 DIAGNOSIS — J9601 Acute respiratory failure with hypoxia: Secondary | ICD-10-CM | POA: Diagnosis not present

## 2021-08-06 DIAGNOSIS — G20 Parkinson's disease: Secondary | ICD-10-CM | POA: Diagnosis not present

## 2021-08-06 DIAGNOSIS — R296 Repeated falls: Secondary | ICD-10-CM | POA: Diagnosis not present

## 2021-08-06 DIAGNOSIS — F428 Other obsessive-compulsive disorder: Secondary | ICD-10-CM | POA: Diagnosis not present

## 2021-08-06 DIAGNOSIS — M6389 Disorders of muscle in diseases classified elsewhere, multiple sites: Secondary | ICD-10-CM | POA: Diagnosis not present

## 2021-08-06 DIAGNOSIS — R41842 Visuospatial deficit: Secondary | ICD-10-CM | POA: Diagnosis not present

## 2021-08-06 DIAGNOSIS — R2689 Other abnormalities of gait and mobility: Secondary | ICD-10-CM | POA: Diagnosis not present

## 2021-08-06 NOTE — Telephone Encounter (Signed)
Message forwarded to Royal Hawthorn, NP  ?

## 2021-08-06 NOTE — Progress Notes (Signed)
?Location:  [facility] ?  ?Place of Service:  SNF (31) ?Provider:   ?Cindi Carbon, ANP ?[place] ?[phone] ? ?[name], MD ? ?Patient Care Team: ?[name], MD as PCP - General (Internal Medicine) ?[name], [name], MD as PCP - Cardiology (Cardiology) ?[name], [name], MD as Consulting Physician (Cardiology) ?[name], MD as Attending Physician (Gastroenterology) ?[name], MD (Inactive) as Consulting Physician (Neurology) ?Community[facility] ([place]) ?Royal Hawthorn, NP as Nurse Practitioner (Nurse Practitioner) ? ?Extended Emergency Contact Information ?Primary Emergency Contact: Heinemann,Nancy C ?Address: [address] ?         [place], [place] [address] [place] ?Home Phone: [phone] ?Mobile Phone: [phone] ?Relation: Spouse ?Secondary Emergency Contact: [name] ?Address: [address] ?         [place], [place] [address] [place] ?Mobile Phone: [phone] ?Relation: Daughter ? ?Code Status:  DNR ?Goals of care: Advanced Directive information ? ?  08/02/2021  ? 10:43 AM  ?Advanced Directives  ?Does Patient Have a Medical Advance Directive? Yes  ?Type of Paramedic of [place];Living will;Out of facility DNR (pink MOST or yellow form)  ?Does patient want to make changes to medical advance directive? No - Patient declined  ?Copy of [place] in Chart? Yes - validated most recent copy scanned in chart (See row information)  ?Pre-existing out of facility DNR order (yellow form or pink MOST form) Yellow form placed in chart (order not valid for inpatient use)  ? ? ? ?Chief Complaint  ?Patient presents with  ? Acute Visit  ?  F/u Covid  ? ? ?HPI:  ?Pt is a 88 y.o. male seen today for an acute visit for f/u covid (vaccinated x 5). He has completed isolation due to covid diagnosed by rapid test with associated symptoms of  lethargy and blue discolored toes with normal 02 sats on 4/20. Given 5 days of Paxlovid. On 4/27 he was given dexamethasone due to wheezing and low 02 sats. Required 2 liters of oxygen. Now he is on room air, no longer wheezing, and has mild residual cough. He is feeling better, eating well, and plans to go out of the facility for an outing today. He did have some hallucinations after first taking the steroid but this has resolved. Intake and out put look adequate. Did have a fall over the weekend which resulted in a skin tear on the right leg, no other injuries.  ?CXR 4/24 showed no acute findings.  ? ?Past Medical History:  ?Diagnosis Date  ? Cellulitis   ? Chronic diastolic CHF (congestive heart failure) ([place])   ? a. EF initially 35-40% after MI 1/09; b. echo 7/08: EF 60%;  c. 11/2014 Echo: EF 65-70%, Gr 1 DD, mild MR.  ? Coronary artery disease   ? a. s/p anterior STEMI 04/2007 with BMS-> LAD;  b. Cath 12/2011 patent stent;  c. low risk nuc 04/2014.  ? Crohn's disease ([place])   ? Diaphragmatic hernia without mention of obstruction or gangrene   ? Diverticulosis of colon (without mention of hemorrhage)   ? Fatty liver   ? a. on ultrasound of 10/2009  ? Flatulence, eructation, and gas pain   ? Fungal infection   ? Gait disorder   ? GERD (gastroesophageal reflux disease)   ? HOH (hard of hearing)   ? Hearing aids  ? Hyperlipidemia   ? Hypertension   ?  Ischemic cardiomyopathy   ? a. EF initially 35-40% after MI 1/09; b. echo 7/08: EF 60%;  c. 11/2014 Echo: EF 65-70%, Gr 1 DD, mild MR.  ? Melanoma ([place])   ? Memory disorder   ? Orthostatic hypotension   ? Osteoporosis   ? Other chronic nonalcoholic liver disease   ? Other esophagitis   ? Parkinson's disease ([place])   ? RBBB 09/02/2013  ? Regional enteritis of small intestine ([place])   ? Renal calculi   ? Restless leg syndrome 03/22/2015  ? Ulcerative (chronic) ileocolitis ([place])   ? Urinary incontinence   ? ?Past Surgical History:  ?Procedure Laterality Date  ? APPENDECTOMY    ?  BACK SURGERY    ? L3, L4, L5  ? CARDIAC CATHETERIZATION  09/25/06  ? EF 35-40% but more recently 60%  ? CATARACT EXTRACTION    ? Bilateral  ? CHOLECYSTECTOMY    ? Coronary artery stent placement    ? Melanoma resection    ? Partial bowel resection    ? TONSILLECTOMY    ? ? ?No Known Allergies ? ?Outpatient Encounter Medications as of 08/06/2021  ?Medication Sig  ? acetaminophen (TYLENOL) 325 MG tablet Take 650 mg by mouth at bedtime.  ? acetaminophen (TYLENOL) 500 MG tablet Take 2 tablets (1,000 mg total) by mouth 3 (three) times daily as needed.  ? aspirin EC 81 MG tablet Take 81 mg by mouth daily.  ? atorvastatin (LIPITOR) 40 MG tablet Take 40 mg by mouth daily.  ? calcium carbonate (OS-CAL) 600 MG TABS tablet Take 600 mg by mouth daily with breakfast.  ? carbidopa-levodopa (SINEMET CR) 50-200 MG tablet Take 1 tablet by mouth 4 (four) times daily.  ? carbidopa-levodopa (SINEMET IR) 25-100 MG tablet Take 1 tablet by mouth 4 (four) times daily.  ? cetirizine (ZYRTEC) 10 MG tablet Take 1 tablet (10 mg total) by mouth at bedtime.  ? Cholecalciferol (VITAMIN D3) 50 MCG (2000 UT) TABS Take 1 tablet by mouth daily.  ? cholestyramine (QUESTRAN) 4 g packet TAKE 1 PACKET BY MOUTH TWICE DAILY WITH A MEAL  ? dexamethasone (DECADRON) 6 MG tablet Take 1 tablet (6 mg total) by mouth 2 (two) times daily with a meal.  ? diclofenac Sodium (VOLTAREN) 1 % GEL Apply topically 3 (three) times daily as needed (Apply to the affected right ankle).  ? donepezil (ARICEPT) 10 MG tablet TAKE ONE TABLET AT BEDTIME  ? ipratropium (ATROVENT) 0.03 % nasal spray Place 2 sprays into both nostrils 3 (three) times daily.  ? lactose free nutrition (BOOST) LIQD Take 237 mLs by mouth as needed.  ? levalbuterol (XOPENEX HFA) 45 MCG/ACT inhaler Inhale 5 puffs into the lungs every 6 (six) hours as needed for wheezing.  ? lidocaine (XYLOCAINE) 2 % solution Use as directed 15 mLs in the mouth or throat as needed for mouth pain.  ? midodrine (PROAMATINE) 5 MG  tablet Take 1 tablet (5 mg total) by mouth 2 (two) times daily with a meal.  ? multivitamin-iron-minerals-folic acid (CENTRUM) chewable tablet Chew 1 tablet by mouth daily.  ? nitroGLYCERIN (NITROSTAT) 0.4 MG SL tablet Place 1 tablet (0.4 mg total) under the tongue every 5 (five) minutes as needed for chest pain. PT OVERDUE FOR OV PLEASE CALL FOR APPT  ? pantoprazole (PROTONIX) 40 MG tablet Take 40 mg by mouth daily.  ? PARoxetine (PAXIL) 20 MG tablet Take 20 mg by mouth 2 (two) times daily.  ? potassium chloride SA (K-DUR,KLOR-CON) 20 MEQ tablet  Take 40 mEq by mouth daily.  ? Probiotic Product (ALIGN) 4 MG CAPS Take 1 capsule by mouth daily.   ? sodium chloride (OCEAN) 0.65 % SOLN nasal spray Place 2 sprays into both nostrils daily as needed for congestion.   ? torsemide (DEMADEX) 20 MG tablet Take 1 tablet (20 mg total) by mouth daily.  ? vitamin B-12 (CYANOCOBALAMIN) 1000 MCG tablet Take 1,000 mcg by mouth daily.  ? ?No facility-administered encounter medications on file as of 08/06/2021.  ? ? ?Review of Systems  ?Constitutional:  Negative for activity change, appetite change, chills, diaphoresis, fatigue, fever and unexpected weight change.  ?HENT:  Negative for congestion.   ?Respiratory:  Positive for cough. Negative for shortness of breath, wheezing and stridor.   ?Cardiovascular:  Negative for chest pain, palpitations and leg swelling.  ?Gastrointestinal:  Negative for abdominal distention, abdominal pain, constipation and diarrhea.  ?Genitourinary:  Negative for difficulty urinating and dysuria.  ?Musculoskeletal:  Positive for gait problem. Negative for arthralgias, back pain, joint swelling and myalgias.  ?Neurological:  Positive for weakness (left hand ongoing). Negative for dizziness, seizures, syncope, facial asymmetry, speech difficulty and headaches.  ?Hematological:  Negative for adenopathy. Does not bruise/bleed easily.  ?Psychiatric/Behavioral:  Positive for confusion. Negative for agitation and  behavioral problems (ocd).   ? ?Immunization History  ?Administered Date(s) Administered  ? Influenza, High Dose Seasonal PF 01/21/2019, 01/10/2021  ? Influenza-Unspecified 12/06/2014, 11/27/2017, 10/26/202

## 2021-08-07 DIAGNOSIS — R293 Abnormal posture: Secondary | ICD-10-CM | POA: Diagnosis not present

## 2021-08-07 DIAGNOSIS — F428 Other obsessive-compulsive disorder: Secondary | ICD-10-CM | POA: Diagnosis not present

## 2021-08-07 DIAGNOSIS — R41842 Visuospatial deficit: Secondary | ICD-10-CM | POA: Diagnosis not present

## 2021-08-07 DIAGNOSIS — M6389 Disorders of muscle in diseases classified elsewhere, multiple sites: Secondary | ICD-10-CM | POA: Diagnosis not present

## 2021-08-07 DIAGNOSIS — R296 Repeated falls: Secondary | ICD-10-CM | POA: Diagnosis not present

## 2021-08-07 DIAGNOSIS — R2689 Other abnormalities of gait and mobility: Secondary | ICD-10-CM | POA: Diagnosis not present

## 2021-08-07 DIAGNOSIS — G20 Parkinson's disease: Secondary | ICD-10-CM | POA: Diagnosis not present

## 2021-08-08 DIAGNOSIS — R296 Repeated falls: Secondary | ICD-10-CM | POA: Diagnosis not present

## 2021-08-08 DIAGNOSIS — G20 Parkinson's disease: Secondary | ICD-10-CM | POA: Diagnosis not present

## 2021-08-08 DIAGNOSIS — M6389 Disorders of muscle in diseases classified elsewhere, multiple sites: Secondary | ICD-10-CM | POA: Diagnosis not present

## 2021-08-08 DIAGNOSIS — R2689 Other abnormalities of gait and mobility: Secondary | ICD-10-CM | POA: Diagnosis not present

## 2021-08-08 DIAGNOSIS — F428 Other obsessive-compulsive disorder: Secondary | ICD-10-CM | POA: Diagnosis not present

## 2021-08-08 DIAGNOSIS — R41842 Visuospatial deficit: Secondary | ICD-10-CM | POA: Diagnosis not present

## 2021-08-08 DIAGNOSIS — R293 Abnormal posture: Secondary | ICD-10-CM | POA: Diagnosis not present

## 2021-08-09 DIAGNOSIS — R296 Repeated falls: Secondary | ICD-10-CM | POA: Diagnosis not present

## 2021-08-09 DIAGNOSIS — R2689 Other abnormalities of gait and mobility: Secondary | ICD-10-CM | POA: Diagnosis not present

## 2021-08-09 DIAGNOSIS — R41842 Visuospatial deficit: Secondary | ICD-10-CM | POA: Diagnosis not present

## 2021-08-09 DIAGNOSIS — M6389 Disorders of muscle in diseases classified elsewhere, multiple sites: Secondary | ICD-10-CM | POA: Diagnosis not present

## 2021-08-09 DIAGNOSIS — R293 Abnormal posture: Secondary | ICD-10-CM | POA: Diagnosis not present

## 2021-08-09 DIAGNOSIS — G20 Parkinson's disease: Secondary | ICD-10-CM | POA: Diagnosis not present

## 2021-08-09 DIAGNOSIS — F428 Other obsessive-compulsive disorder: Secondary | ICD-10-CM | POA: Diagnosis not present

## 2021-08-10 DIAGNOSIS — M6389 Disorders of muscle in diseases classified elsewhere, multiple sites: Secondary | ICD-10-CM | POA: Diagnosis not present

## 2021-08-10 DIAGNOSIS — R296 Repeated falls: Secondary | ICD-10-CM | POA: Diagnosis not present

## 2021-08-10 DIAGNOSIS — R41842 Visuospatial deficit: Secondary | ICD-10-CM | POA: Diagnosis not present

## 2021-08-10 DIAGNOSIS — F428 Other obsessive-compulsive disorder: Secondary | ICD-10-CM | POA: Diagnosis not present

## 2021-08-10 DIAGNOSIS — G20 Parkinson's disease: Secondary | ICD-10-CM | POA: Diagnosis not present

## 2021-08-10 DIAGNOSIS — R293 Abnormal posture: Secondary | ICD-10-CM | POA: Diagnosis not present

## 2021-08-10 DIAGNOSIS — R2689 Other abnormalities of gait and mobility: Secondary | ICD-10-CM | POA: Diagnosis not present

## 2021-08-13 DIAGNOSIS — G20 Parkinson's disease: Secondary | ICD-10-CM | POA: Diagnosis not present

## 2021-08-13 DIAGNOSIS — M6389 Disorders of muscle in diseases classified elsewhere, multiple sites: Secondary | ICD-10-CM | POA: Diagnosis not present

## 2021-08-13 DIAGNOSIS — F428 Other obsessive-compulsive disorder: Secondary | ICD-10-CM | POA: Diagnosis not present

## 2021-08-13 DIAGNOSIS — R296 Repeated falls: Secondary | ICD-10-CM | POA: Diagnosis not present

## 2021-08-13 DIAGNOSIS — R293 Abnormal posture: Secondary | ICD-10-CM | POA: Diagnosis not present

## 2021-08-13 DIAGNOSIS — R2689 Other abnormalities of gait and mobility: Secondary | ICD-10-CM | POA: Diagnosis not present

## 2021-08-13 DIAGNOSIS — R41842 Visuospatial deficit: Secondary | ICD-10-CM | POA: Diagnosis not present

## 2021-08-14 ENCOUNTER — Other Ambulatory Visit: Payer: Self-pay | Admitting: Orthopedic Surgery

## 2021-08-14 DIAGNOSIS — M6389 Disorders of muscle in diseases classified elsewhere, multiple sites: Secondary | ICD-10-CM | POA: Diagnosis not present

## 2021-08-14 DIAGNOSIS — F428 Other obsessive-compulsive disorder: Secondary | ICD-10-CM | POA: Diagnosis not present

## 2021-08-14 DIAGNOSIS — R2689 Other abnormalities of gait and mobility: Secondary | ICD-10-CM | POA: Diagnosis not present

## 2021-08-14 DIAGNOSIS — F419 Anxiety disorder, unspecified: Secondary | ICD-10-CM

## 2021-08-14 DIAGNOSIS — R293 Abnormal posture: Secondary | ICD-10-CM | POA: Diagnosis not present

## 2021-08-14 DIAGNOSIS — R41842 Visuospatial deficit: Secondary | ICD-10-CM | POA: Diagnosis not present

## 2021-08-14 DIAGNOSIS — R296 Repeated falls: Secondary | ICD-10-CM | POA: Diagnosis not present

## 2021-08-14 DIAGNOSIS — G20 Parkinson's disease: Secondary | ICD-10-CM | POA: Diagnosis not present

## 2021-08-14 MED ORDER — LORAZEPAM 0.5 MG PO TABS: 0.5000 mg | ORAL_TABLET | Freq: Every day | ORAL | 0 refills | Status: DC | PRN

## 2021-08-15 DIAGNOSIS — G20 Parkinson's disease: Secondary | ICD-10-CM | POA: Diagnosis not present

## 2021-08-15 DIAGNOSIS — R296 Repeated falls: Secondary | ICD-10-CM | POA: Diagnosis not present

## 2021-08-15 DIAGNOSIS — R2689 Other abnormalities of gait and mobility: Secondary | ICD-10-CM | POA: Diagnosis not present

## 2021-08-15 DIAGNOSIS — R293 Abnormal posture: Secondary | ICD-10-CM | POA: Diagnosis not present

## 2021-08-15 DIAGNOSIS — M6389 Disorders of muscle in diseases classified elsewhere, multiple sites: Secondary | ICD-10-CM | POA: Diagnosis not present

## 2021-08-15 DIAGNOSIS — F428 Other obsessive-compulsive disorder: Secondary | ICD-10-CM | POA: Diagnosis not present

## 2021-08-15 DIAGNOSIS — R41842 Visuospatial deficit: Secondary | ICD-10-CM | POA: Diagnosis not present

## 2021-08-16 DIAGNOSIS — F428 Other obsessive-compulsive disorder: Secondary | ICD-10-CM | POA: Diagnosis not present

## 2021-08-16 DIAGNOSIS — R293 Abnormal posture: Secondary | ICD-10-CM | POA: Diagnosis not present

## 2021-08-16 DIAGNOSIS — G20 Parkinson's disease: Secondary | ICD-10-CM | POA: Diagnosis not present

## 2021-08-16 DIAGNOSIS — M6389 Disorders of muscle in diseases classified elsewhere, multiple sites: Secondary | ICD-10-CM | POA: Diagnosis not present

## 2021-08-16 DIAGNOSIS — R296 Repeated falls: Secondary | ICD-10-CM | POA: Diagnosis not present

## 2021-08-16 DIAGNOSIS — R41842 Visuospatial deficit: Secondary | ICD-10-CM | POA: Diagnosis not present

## 2021-08-16 DIAGNOSIS — R2689 Other abnormalities of gait and mobility: Secondary | ICD-10-CM | POA: Diagnosis not present

## 2021-08-17 DIAGNOSIS — R2689 Other abnormalities of gait and mobility: Secondary | ICD-10-CM | POA: Diagnosis not present

## 2021-08-17 DIAGNOSIS — G20 Parkinson's disease: Secondary | ICD-10-CM | POA: Diagnosis not present

## 2021-08-17 DIAGNOSIS — R296 Repeated falls: Secondary | ICD-10-CM | POA: Diagnosis not present

## 2021-08-17 DIAGNOSIS — M6389 Disorders of muscle in diseases classified elsewhere, multiple sites: Secondary | ICD-10-CM | POA: Diagnosis not present

## 2021-08-17 DIAGNOSIS — R41842 Visuospatial deficit: Secondary | ICD-10-CM | POA: Diagnosis not present

## 2021-08-17 DIAGNOSIS — R293 Abnormal posture: Secondary | ICD-10-CM | POA: Diagnosis not present

## 2021-08-17 DIAGNOSIS — F428 Other obsessive-compulsive disorder: Secondary | ICD-10-CM | POA: Diagnosis not present

## 2021-08-20 ENCOUNTER — Telehealth: Payer: Self-pay | Admitting: Adult Health

## 2021-08-20 DIAGNOSIS — M6389 Disorders of muscle in diseases classified elsewhere, multiple sites: Secondary | ICD-10-CM | POA: Diagnosis not present

## 2021-08-20 DIAGNOSIS — R41842 Visuospatial deficit: Secondary | ICD-10-CM | POA: Diagnosis not present

## 2021-08-20 DIAGNOSIS — F429 Obsessive-compulsive disorder, unspecified: Secondary | ICD-10-CM

## 2021-08-20 DIAGNOSIS — R296 Repeated falls: Secondary | ICD-10-CM | POA: Diagnosis not present

## 2021-08-20 DIAGNOSIS — F419 Anxiety disorder, unspecified: Secondary | ICD-10-CM

## 2021-08-20 DIAGNOSIS — G20 Parkinson's disease: Secondary | ICD-10-CM | POA: Diagnosis not present

## 2021-08-20 DIAGNOSIS — F428 Other obsessive-compulsive disorder: Secondary | ICD-10-CM | POA: Diagnosis not present

## 2021-08-20 DIAGNOSIS — R2689 Other abnormalities of gait and mobility: Secondary | ICD-10-CM | POA: Diagnosis not present

## 2021-08-20 DIAGNOSIS — R293 Abnormal posture: Secondary | ICD-10-CM | POA: Diagnosis not present

## 2021-08-20 MED ORDER — LORAZEPAM 0.5 MG PO TABS: 0.5000 mg | ORAL_TABLET | Freq: Three times a day (TID) | ORAL | 0 refills | Status: AC | PRN

## 2021-08-20 NOTE — Telephone Encounter (Signed)
Nurse reports pt is having anxiety and OCD related issues. Seems worse after having covid. He has been using ativan once daily with benefit but it wears off. Order given to increase to three times daily prn x 14 days. If symptoms continue would re consult with Dr Casimiro Needle due to prior hx of OCD/dementia.  ?

## 2021-08-22 DIAGNOSIS — G20 Parkinson's disease: Secondary | ICD-10-CM | POA: Diagnosis not present

## 2021-08-22 DIAGNOSIS — M6389 Disorders of muscle in diseases classified elsewhere, multiple sites: Secondary | ICD-10-CM | POA: Diagnosis not present

## 2021-08-22 DIAGNOSIS — R296 Repeated falls: Secondary | ICD-10-CM | POA: Diagnosis not present

## 2021-08-22 DIAGNOSIS — L570 Actinic keratosis: Secondary | ICD-10-CM | POA: Diagnosis not present

## 2021-08-22 DIAGNOSIS — R41842 Visuospatial deficit: Secondary | ICD-10-CM | POA: Diagnosis not present

## 2021-08-22 DIAGNOSIS — R2689 Other abnormalities of gait and mobility: Secondary | ICD-10-CM | POA: Diagnosis not present

## 2021-08-22 DIAGNOSIS — F428 Other obsessive-compulsive disorder: Secondary | ICD-10-CM | POA: Diagnosis not present

## 2021-08-22 DIAGNOSIS — Z85820 Personal history of malignant melanoma of skin: Secondary | ICD-10-CM | POA: Diagnosis not present

## 2021-08-22 DIAGNOSIS — R293 Abnormal posture: Secondary | ICD-10-CM | POA: Diagnosis not present

## 2021-08-22 DIAGNOSIS — D485 Neoplasm of uncertain behavior of skin: Secondary | ICD-10-CM | POA: Diagnosis not present

## 2021-08-22 DIAGNOSIS — Z85828 Personal history of other malignant neoplasm of skin: Secondary | ICD-10-CM | POA: Diagnosis not present

## 2021-08-23 DIAGNOSIS — D485 Neoplasm of uncertain behavior of skin: Secondary | ICD-10-CM | POA: Diagnosis not present

## 2021-08-23 DIAGNOSIS — R41842 Visuospatial deficit: Secondary | ICD-10-CM | POA: Diagnosis not present

## 2021-08-23 DIAGNOSIS — Z85820 Personal history of malignant melanoma of skin: Secondary | ICD-10-CM | POA: Diagnosis not present

## 2021-08-23 DIAGNOSIS — M6389 Disorders of muscle in diseases classified elsewhere, multiple sites: Secondary | ICD-10-CM | POA: Diagnosis not present

## 2021-08-23 DIAGNOSIS — R2689 Other abnormalities of gait and mobility: Secondary | ICD-10-CM | POA: Diagnosis not present

## 2021-08-23 DIAGNOSIS — F428 Other obsessive-compulsive disorder: Secondary | ICD-10-CM | POA: Diagnosis not present

## 2021-08-23 DIAGNOSIS — R293 Abnormal posture: Secondary | ICD-10-CM | POA: Diagnosis not present

## 2021-08-23 DIAGNOSIS — Z85828 Personal history of other malignant neoplasm of skin: Secondary | ICD-10-CM | POA: Diagnosis not present

## 2021-08-23 DIAGNOSIS — G20 Parkinson's disease: Secondary | ICD-10-CM | POA: Diagnosis not present

## 2021-08-23 DIAGNOSIS — R296 Repeated falls: Secondary | ICD-10-CM | POA: Diagnosis not present

## 2021-08-23 DIAGNOSIS — L570 Actinic keratosis: Secondary | ICD-10-CM | POA: Diagnosis not present

## 2021-08-24 DIAGNOSIS — R296 Repeated falls: Secondary | ICD-10-CM | POA: Diagnosis not present

## 2021-08-24 DIAGNOSIS — F428 Other obsessive-compulsive disorder: Secondary | ICD-10-CM | POA: Diagnosis not present

## 2021-08-24 DIAGNOSIS — M6389 Disorders of muscle in diseases classified elsewhere, multiple sites: Secondary | ICD-10-CM | POA: Diagnosis not present

## 2021-08-24 DIAGNOSIS — R293 Abnormal posture: Secondary | ICD-10-CM | POA: Diagnosis not present

## 2021-08-24 DIAGNOSIS — R41842 Visuospatial deficit: Secondary | ICD-10-CM | POA: Diagnosis not present

## 2021-08-24 DIAGNOSIS — G20 Parkinson's disease: Secondary | ICD-10-CM | POA: Diagnosis not present

## 2021-08-24 DIAGNOSIS — R2689 Other abnormalities of gait and mobility: Secondary | ICD-10-CM | POA: Diagnosis not present

## 2021-08-27 ENCOUNTER — Non-Acute Institutional Stay (SKILLED_NURSING_FACILITY): Payer: PPO | Admitting: Internal Medicine

## 2021-08-27 ENCOUNTER — Encounter: Payer: Self-pay | Admitting: Internal Medicine

## 2021-08-27 DIAGNOSIS — R2689 Other abnormalities of gait and mobility: Secondary | ICD-10-CM | POA: Diagnosis not present

## 2021-08-27 DIAGNOSIS — F429 Obsessive-compulsive disorder, unspecified: Secondary | ICD-10-CM

## 2021-08-27 DIAGNOSIS — G20 Parkinson's disease: Secondary | ICD-10-CM

## 2021-08-27 DIAGNOSIS — R634 Abnormal weight loss: Secondary | ICD-10-CM | POA: Diagnosis not present

## 2021-08-27 DIAGNOSIS — I951 Orthostatic hypotension: Secondary | ICD-10-CM | POA: Diagnosis not present

## 2021-08-27 DIAGNOSIS — M6389 Disorders of muscle in diseases classified elsewhere, multiple sites: Secondary | ICD-10-CM | POA: Diagnosis not present

## 2021-08-27 DIAGNOSIS — R296 Repeated falls: Secondary | ICD-10-CM | POA: Diagnosis not present

## 2021-08-27 DIAGNOSIS — U099 Post covid-19 condition, unspecified: Secondary | ICD-10-CM | POA: Diagnosis not present

## 2021-08-27 DIAGNOSIS — R29898 Other symptoms and signs involving the musculoskeletal system: Secondary | ICD-10-CM | POA: Diagnosis not present

## 2021-08-27 DIAGNOSIS — E782 Mixed hyperlipidemia: Secondary | ICD-10-CM | POA: Diagnosis not present

## 2021-08-27 DIAGNOSIS — R41842 Visuospatial deficit: Secondary | ICD-10-CM | POA: Diagnosis not present

## 2021-08-27 DIAGNOSIS — F428 Other obsessive-compulsive disorder: Secondary | ICD-10-CM | POA: Diagnosis not present

## 2021-08-27 DIAGNOSIS — F0280 Dementia in other diseases classified elsewhere without behavioral disturbance: Secondary | ICD-10-CM | POA: Diagnosis not present

## 2021-08-27 DIAGNOSIS — K5000 Crohn's disease of small intestine without complications: Secondary | ICD-10-CM | POA: Diagnosis not present

## 2021-08-27 DIAGNOSIS — I2510 Atherosclerotic heart disease of native coronary artery without angina pectoris: Secondary | ICD-10-CM | POA: Diagnosis not present

## 2021-08-27 DIAGNOSIS — R293 Abnormal posture: Secondary | ICD-10-CM | POA: Diagnosis not present

## 2021-08-27 NOTE — Progress Notes (Unsigned)
Location:   [facility] Room Number: 133A Place of Service:  SNF [phone]) Provider:  Veleta Miners, MD  [name], MD  Patient Care Team: [name], MD as PCP - General (Internal Medicine) [name], [name], MD as PCP - Cardiology (Cardiology) [name], [name], MD as Consulting Physician (Cardiology) [name], MD as Attending Physician (Gastroenterology) [name], MD (Inactive) as Consulting Physician (Neurology) Community[facility] Royal Hawthorn, NP as Nurse Practitioner (Nurse Practitioner)  Extended Emergency Contact Information Primary Emergency Contact: [name] Address: [address] [place] [place]          [place], [place] [address] [name] of [place] Phone: [phone] Mobile Phone: [phone] Relation: Spouse Secondary Emergency Contact: [name] Address: [address]          [place],  [address] [name] of [place] Phone: [phone] Relation: Daughter  Code Status:  DNR Goals of care: Advanced Directive information    08/27/2021    3:19 PM  Advanced Directives  Does Patient Have a Medical Advance Directive? Yes  Type of Paramedic of [place];Living will;Out of facility DNR (pink MOST or yellow form)  Does patient want to make changes to medical advance directive? No - Patient declined  Copy of [place] in Chart? Yes - validated most recent copy scanned in chart (See row information)  Pre-existing out of facility DNR order (yellow form or pink MOST form) Yellow form placed in chart (order not valid for inpatient use)     Chief Complaint  Patient presents with   Medical Management of Chronic Issues    Routine follow up visit.    HPI:  Pt is a 88 y.o. male seen today for medical management of chronic diseases.    Lives in SNF  Patient has h/o Parkinson disease on Sinemet  Follows with Neurology H/o CAD Follows with Dr [name] Chronic CHF with LE edema HTN, HLD Also h/o Crohn's Disease Don't see any recent follow up with GI Orthostatic hypotension Also has h/o OCD follows with Facility Psychiatry    Recent Issues  Recent Covid with Severe symptoms Now off Oxygen not Wheezing Mental status at baseline  OCD behavior with Hallucinations Worsening since covid and he was on Dexamethasone causing him to have Hallucinations Now on PRN ativan and seems to be doing well per Nurses Left Arm weakness with Contractures CT Scan  of Brain and neck  Did not show any acute Infarct CT scan of Neck showed Advanced cervical spine degeneration,with Severe Spinal stenosis Weight loss Wt Readings from Last 3 Encounters:  08/27/21 155 lb (70.3 kg)  07/31/21 161 lb 11.2 oz (73.3 kg)  06/28/21 167 lb 14.4 oz (76.2 kg)    He seemed at his baseline today Denied any issues Per nurses behavior better since off dexaMethasone  and on ativan Past Medical History:  Diagnosis Date   Cellulitis    Chronic diastolic CHF (congestive heart failure) ([place])    a. EF initially 35-40% after MI 1/09; b. echo 7/08: EF 60%;  c. 11/2014 Echo: EF 65-70%, Gr 1 DD, mild MR.   Coronary artery disease    a. s/p anterior STEMI 04/2007 with BMS-> LAD;  b. Cath 12/2011 patent stent;  c. low risk nuc 04/2014.   Crohn's disease ([place])    Diaphragmatic hernia without mention of obstruction or gangrene    Diverticulosis of colon (without mention of hemorrhage)  Fatty liver    a. on ultrasound of 10/2009   Flatulence, eructation, and gas pain    Fungal infection    Gait disorder    GERD (gastroesophageal reflux disease)    HOH (hard of hearing)    Hearing aids   Hyperlipidemia    Hypertension    Ischemic cardiomyopathy    a. EF initially 35-40% after MI 1/09; b. echo 7/08: EF 60%;  c. 11/2014 Echo: EF 65-70%, Gr 1 DD, mild MR.   Melanoma ([place])    Memory disorder    Orthostatic hypotension     Osteoporosis    Other chronic nonalcoholic liver disease    Other esophagitis    Parkinson's disease ([place])    RBBB 09/02/2013   Regional enteritis of small intestine ([facility])    Renal calculi    Restless leg syndrome 03/22/2015   Ulcerative (chronic) ileocolitis (HCC)    Urinary incontinence    Past Surgical History:  Procedure Laterality Date   APPENDECTOMY     BACK SURGERY     L3, L4, L5   CARDIAC CATHETERIZATION  09/25/06   EF 35-40% but more recently 60%   CATARACT EXTRACTION     Bilateral   CHOLECYSTECTOMY     Coronary artery stent placement     Melanoma resection     Partial bowel resection     TONSILLECTOMY      No Known Allergies  Allergies as of 08/27/2021   No Known Allergies      Medication List        Accurate as of Aug 27, 2021  3:20 PM. If you have any questions, ask your nurse or doctor.          STOP taking these medications    dexamethasone 6 MG tablet Commonly known as: DECADRON Stopped by: [name], MD       TAKE these medications    acetaminophen 325 MG tablet Commonly known as: TYLENOL Take 650 mg by mouth at bedtime.   acetaminophen 500 MG tablet Commonly known as: TYLENOL Take 2 tablets (1,000 mg total) by mouth 3 (three) times daily as needed.   Align 4 MG Caps Take 1 capsule by mouth daily.   aspirin EC 81 MG tablet Take 81 mg by mouth daily.   atorvastatin 40 MG tablet Commonly known as: LIPITOR Take 40 mg by mouth daily.   calcium carbonate 600 MG Tabs tablet Commonly known as: OS-CAL Take 600 mg by mouth daily with breakfast.   carbidopa-levodopa 25-100 MG tablet Commonly known as: SINEMET IR Take 1 tablet by mouth 4 (four) times daily.   carbidopa-levodopa 50-200 MG tablet Commonly known as: SINEMET CR Take 1 tablet by mouth 4 (four) times daily.   cetirizine 10 MG tablet Commonly known as: ZYRTEC Take 1 tablet (10 mg total) by mouth at bedtime.   cholestyramine 4 g packet Commonly known as:  QUESTRAN TAKE 1 PACKET BY MOUTH TWICE DAILY WITH A MEAL   diclofenac Sodium 1 % Gel Commonly known as: VOLTAREN Apply topically 3 (three) times daily as needed (Apply to the affected right ankle).   donepezil 10 MG tablet Commonly known as: ARICEPT TAKE ONE TABLET AT BEDTIME   ipratropium 0.03 % nasal spray Commonly known as: ATROVENT Place 2 sprays into both nostrils 3 (three) times daily.   lactose free nutrition Liqd Take 237 mLs by mouth as needed.   levalbuterol 45 MCG/ACT inhaler Commonly known as: Xopenex HFA Inhale 5 puffs into the  lungs every 6 (six) hours as needed for wheezing.   lidocaine 2 % solution Commonly known as: XYLOCAINE Use as directed 15 mLs in the mouth or throat as needed for mouth pain.   LORazepam 0.5 MG tablet Commonly known as: ATIVAN Take 1 tablet (0.5 mg total) by mouth every 8 (eight) hours as needed for up to 14 days for anxiety.   midodrine 5 MG tablet Commonly known as: PROAMATINE Take 1 tablet (5 mg total) by mouth 2 (two) times daily with a meal.   multivitamin-iron-minerals-folic acid chewable tablet Chew 1 tablet by mouth daily.   nitroGLYCERIN 0.4 MG SL tablet Commonly known as: NITROSTAT Place 1 tablet (0.4 mg total) under the tongue every 5 (five) minutes as needed for chest pain. PT OVERDUE FOR OV PLEASE CALL FOR APPT   OXYGEN Inhale 2 L into the lungs.   pantoprazole 40 MG tablet Commonly known as: PROTONIX Take 40 mg by mouth daily.   PARoxetine 20 MG tablet Commonly known as: PAXIL Take 20 mg by mouth 2 (two) times daily.   potassium chloride SA 20 MEQ tablet Commonly known as: KLOR-CON M Take 40 mEq by mouth daily.   sodium chloride 0.65 % Soln nasal spray Commonly known as: OCEAN Place 2 sprays into both nostrils daily as needed for congestion.   torsemide 20 MG tablet Commonly known as: DEMADEX Take 1 tablet (20 mg total) by mouth daily.   vitamin B-12 1000 MCG tablet Commonly known as:  CYANOCOBALAMIN Take 1,000 mcg by mouth daily.   Vitamin D3 50 MCG (2000 UT) Tabs Take 1 tablet by mouth daily.        Review of Systems  Constitutional:  Positive for activity change. Negative for appetite change and unexpected weight change.  HENT: Negative.    Respiratory:  Negative for cough and shortness of breath.   Cardiovascular:  Negative for leg swelling.  Gastrointestinal:  Negative for constipation.  Genitourinary:  Negative for frequency.  Musculoskeletal:  Positive for gait problem. Negative for arthralgias and myalgias.  Skin: Negative.  Negative for rash.  Neurological:  Negative for dizziness and weakness.  Psychiatric/Behavioral:  Positive for confusion and hallucinations. Negative for sleep disturbance. The patient is nervous/anxious.   All other systems reviewed and are negative.  Immunization History  Administered Date(s) Administered   Influenza, High Dose Seasonal PF 01/21/2019, 01/10/2021   Influenza-Unspecified 12/06/2014, 11/27/2017, 02/01/2020   Moderna Covid-19 Vaccine Bivalent Booster 69yr & up 01/17/2021   Moderna SARS-COV2 Booster Vaccination 11/28/2020   Moderna Sars-Covid-2 Vaccination 04/13/2019, 05/11/2019, 02/18/2020   Pneumococcal Conjugate-13 05/21/2018   Pneumococcal Polysaccharide-23 05/21/2005   Tdap 06/03/2006, 10/22/2018   Zoster Recombinat (Shingrix) 10/22/2018, 12/24/2018   Pertinent  Health Maintenance Due  Topic Date Due   INFLUENZA VACCINE  11/06/2021      05/12/2018    3:21 PM 10/22/2018    2:23 PM 07/20/2019   10:06 AM 11/11/2019   10:53 AM 12/07/2020    1:38 PM  Fall Risk  Falls in the past year? 1 1  1 1   Was there an injury with Fall? 0 1  1 1   Fall Risk Category Calculator 2 3  2 3   Fall Risk Category Moderate High  Moderate High  Patient Fall Risk Level Moderate fall risk   High fall risk High fall risk  Patient at Risk for Falls Due to Impaired mobility;History of fall(s);Impaired balance/gait;Mental status  change;Medication side effect History of fall(s);Impaired mobility History of fall(s);Impaired balance/gait;Impaired mobility;Mental status change;Medication  side effect History of fall(s);Impaired balance/gait Impaired balance/gait;History of fall(s)  Fall risk Follow up Falls evaluation completed;Falls prevention discussed;Education provided Falls evaluation completed;Falls prevention discussed Education provided;Falls prevention discussed;Falls evaluation completed Falls evaluation completed Falls evaluation completed;Follow up appointment  Fall risk Follow up - Comments   at [facility]:    Vitals:   08/27/21 1506  BP: 116/66  Pulse: 70  Resp: 16  Temp: (!) 97.3 F (36.3 C)  SpO2: 96%  Weight: 155 lb (70.3 kg)  Height: 5' 9.8" (1.773 m)   Body mass index is 22.37 kg/m. Physical Exam Vitals reviewed.  Constitutional:      Appearance: Normal appearance.  HENT:     Head: Normocephalic.     Nose: Nose normal.     Mouth/Throat:     Mouth: Mucous membranes are moist.     Pharynx: Oropharynx is clear.  Eyes:     Pupils: Pupils are equal, round, and reactive to light.  Cardiovascular:     Rate and Rhythm: Normal rate and regular rhythm.     Pulses: Normal pulses.     Heart sounds: No murmur heard. Pulmonary:     Effort: Pulmonary effort is normal. No respiratory distress.     Breath sounds: Normal breath sounds. No rales.  Abdominal:     General: Abdomen is flat. Bowel sounds are normal.     Palpations: Abdomen is soft.  Musculoskeletal:        General: No swelling.     Cervical back: Neck supple.  Skin:    General: Skin is warm.  Neurological:     Mental Status: He is alert.     Comments: Has Contractures in his Left Hand Unable to do more exam as he does not follow and Concentrate on my commands Keep telling me that he is fine  Psychiatric:        Mood and Affect: Mood normal.        Thought Content: Thought content normal.    Labs  reviewed: Recent Labs    06/04/21 0000 07/27/21 0000 08/03/21 0000  NA 141 143 141  K 4.5 3.7 3.9  CL 99 101 102  CO2 31* 26* 23*  BUN 18 25* 23*  CREATININE 1.1 1.2 1.0  CALCIUM 8.4* 8.4* 8.7   Recent Labs    06/04/21 0000  AST 26  ALT 12  ALKPHOS 71  ALBUMIN 4.4   Recent Labs    06/04/21 0000 07/27/21 0000 08/03/21 0000  WBC 7.4 6.5 10.5  NEUTROABS 4.40  --   --   HGB 12.9* 13.3* 13.1*  HCT 38* 39* 39*  PLT 245 191 260   Lab Results  Component Value Date   TSH 3.60 10/16/2020   No results found for: HGBA1C Lab Results  Component Value Date   CHOL 119 05/10/2021   HDL 56 05/10/2021   LDLCALC 48 05/10/2021   TRIG 76 05/10/2021    Significant Diagnostic Results in last 30 days:  No results found.  Assessment/Plan 1. Obsessive-compulsive disorder, unspecified type On Paxil and Follows with Dr Casimiro Needle His symptoms got worse on Dexamethasone Now doing well on ativan  2. Post-COVID-19 condition Is improving off oxygen now  3. Weight loss ? Due to Covid Has started eating well now  4. Left hand weakness Most likely due to His Cervical stenosis and Myelopathy Not candidate for any work up  5. Crohn's disease of small intestine without complication ([place]) On Questran  6. GERD On Protnix   7. Dementia due to Parkinson's disease without behavioral disturbance (HCC) On Aricept MMSE 28/30  8. Parkinson disease ([place]) On Sinemet Use to see Dr Glean Salvo see any recent Appointments  9. Orthostatic hypotension Continue Midodrine  10. Coronary artery disease involving native coronary artery of native heart without angina pectoris  On Aspirin and Statin LDL 48 in 56/38 11 Diastolic CHF On Torsemide Looked Euvolemic Bun was stable  Family/ staff Communication:   Labs/tests ordered:

## 2021-08-29 DIAGNOSIS — R2689 Other abnormalities of gait and mobility: Secondary | ICD-10-CM | POA: Diagnosis not present

## 2021-08-29 DIAGNOSIS — M6389 Disorders of muscle in diseases classified elsewhere, multiple sites: Secondary | ICD-10-CM | POA: Diagnosis not present

## 2021-08-29 DIAGNOSIS — F428 Other obsessive-compulsive disorder: Secondary | ICD-10-CM | POA: Diagnosis not present

## 2021-08-29 DIAGNOSIS — R41842 Visuospatial deficit: Secondary | ICD-10-CM | POA: Diagnosis not present

## 2021-08-29 DIAGNOSIS — G20 Parkinson's disease: Secondary | ICD-10-CM | POA: Diagnosis not present

## 2021-08-29 DIAGNOSIS — R296 Repeated falls: Secondary | ICD-10-CM | POA: Diagnosis not present

## 2021-08-29 DIAGNOSIS — R293 Abnormal posture: Secondary | ICD-10-CM | POA: Diagnosis not present

## 2021-08-30 DIAGNOSIS — R293 Abnormal posture: Secondary | ICD-10-CM | POA: Diagnosis not present

## 2021-08-30 DIAGNOSIS — R2689 Other abnormalities of gait and mobility: Secondary | ICD-10-CM | POA: Diagnosis not present

## 2021-08-30 DIAGNOSIS — F428 Other obsessive-compulsive disorder: Secondary | ICD-10-CM | POA: Diagnosis not present

## 2021-08-30 DIAGNOSIS — R296 Repeated falls: Secondary | ICD-10-CM | POA: Diagnosis not present

## 2021-08-30 DIAGNOSIS — M6389 Disorders of muscle in diseases classified elsewhere, multiple sites: Secondary | ICD-10-CM | POA: Diagnosis not present

## 2021-08-30 DIAGNOSIS — G20 Parkinson's disease: Secondary | ICD-10-CM | POA: Diagnosis not present

## 2021-08-30 DIAGNOSIS — R41842 Visuospatial deficit: Secondary | ICD-10-CM | POA: Diagnosis not present

## 2021-08-31 DIAGNOSIS — R41842 Visuospatial deficit: Secondary | ICD-10-CM | POA: Diagnosis not present

## 2021-08-31 DIAGNOSIS — R2689 Other abnormalities of gait and mobility: Secondary | ICD-10-CM | POA: Diagnosis not present

## 2021-08-31 DIAGNOSIS — M6389 Disorders of muscle in diseases classified elsewhere, multiple sites: Secondary | ICD-10-CM | POA: Diagnosis not present

## 2021-08-31 DIAGNOSIS — R293 Abnormal posture: Secondary | ICD-10-CM | POA: Diagnosis not present

## 2021-08-31 DIAGNOSIS — G20 Parkinson's disease: Secondary | ICD-10-CM | POA: Diagnosis not present

## 2021-08-31 DIAGNOSIS — F428 Other obsessive-compulsive disorder: Secondary | ICD-10-CM | POA: Diagnosis not present

## 2021-08-31 DIAGNOSIS — R296 Repeated falls: Secondary | ICD-10-CM | POA: Diagnosis not present

## 2021-09-04 DIAGNOSIS — R2689 Other abnormalities of gait and mobility: Secondary | ICD-10-CM | POA: Diagnosis not present

## 2021-09-04 DIAGNOSIS — R296 Repeated falls: Secondary | ICD-10-CM | POA: Diagnosis not present

## 2021-09-04 DIAGNOSIS — R41842 Visuospatial deficit: Secondary | ICD-10-CM | POA: Diagnosis not present

## 2021-09-04 DIAGNOSIS — G20 Parkinson's disease: Secondary | ICD-10-CM | POA: Diagnosis not present

## 2021-09-04 DIAGNOSIS — F428 Other obsessive-compulsive disorder: Secondary | ICD-10-CM | POA: Diagnosis not present

## 2021-09-04 DIAGNOSIS — R293 Abnormal posture: Secondary | ICD-10-CM | POA: Diagnosis not present

## 2021-09-04 DIAGNOSIS — M6389 Disorders of muscle in diseases classified elsewhere, multiple sites: Secondary | ICD-10-CM | POA: Diagnosis not present

## 2021-09-06 DIAGNOSIS — F428 Other obsessive-compulsive disorder: Secondary | ICD-10-CM | POA: Diagnosis not present

## 2021-09-06 DIAGNOSIS — R296 Repeated falls: Secondary | ICD-10-CM | POA: Diagnosis not present

## 2021-09-06 DIAGNOSIS — M6389 Disorders of muscle in diseases classified elsewhere, multiple sites: Secondary | ICD-10-CM | POA: Diagnosis not present

## 2021-09-06 DIAGNOSIS — R293 Abnormal posture: Secondary | ICD-10-CM | POA: Diagnosis not present

## 2021-09-06 DIAGNOSIS — R2689 Other abnormalities of gait and mobility: Secondary | ICD-10-CM | POA: Diagnosis not present

## 2021-09-06 DIAGNOSIS — R41842 Visuospatial deficit: Secondary | ICD-10-CM | POA: Diagnosis not present

## 2021-09-06 DIAGNOSIS — G20 Parkinson's disease: Secondary | ICD-10-CM | POA: Diagnosis not present

## 2021-09-07 DIAGNOSIS — F605 Obsessive-compulsive personality disorder: Secondary | ICD-10-CM | POA: Diagnosis not present

## 2021-09-11 DIAGNOSIS — R41842 Visuospatial deficit: Secondary | ICD-10-CM | POA: Diagnosis not present

## 2021-09-11 DIAGNOSIS — R293 Abnormal posture: Secondary | ICD-10-CM | POA: Diagnosis not present

## 2021-09-11 DIAGNOSIS — G20 Parkinson's disease: Secondary | ICD-10-CM | POA: Diagnosis not present

## 2021-09-11 DIAGNOSIS — M6389 Disorders of muscle in diseases classified elsewhere, multiple sites: Secondary | ICD-10-CM | POA: Diagnosis not present

## 2021-09-11 DIAGNOSIS — R2689 Other abnormalities of gait and mobility: Secondary | ICD-10-CM | POA: Diagnosis not present

## 2021-09-11 DIAGNOSIS — R296 Repeated falls: Secondary | ICD-10-CM | POA: Diagnosis not present

## 2021-09-11 DIAGNOSIS — F428 Other obsessive-compulsive disorder: Secondary | ICD-10-CM | POA: Diagnosis not present

## 2021-09-12 DIAGNOSIS — R296 Repeated falls: Secondary | ICD-10-CM | POA: Diagnosis not present

## 2021-09-12 DIAGNOSIS — G20 Parkinson's disease: Secondary | ICD-10-CM | POA: Diagnosis not present

## 2021-09-12 DIAGNOSIS — R2689 Other abnormalities of gait and mobility: Secondary | ICD-10-CM | POA: Diagnosis not present

## 2021-09-12 DIAGNOSIS — R293 Abnormal posture: Secondary | ICD-10-CM | POA: Diagnosis not present

## 2021-09-12 DIAGNOSIS — M6389 Disorders of muscle in diseases classified elsewhere, multiple sites: Secondary | ICD-10-CM | POA: Diagnosis not present

## 2021-09-12 DIAGNOSIS — F428 Other obsessive-compulsive disorder: Secondary | ICD-10-CM | POA: Diagnosis not present

## 2021-09-12 DIAGNOSIS — R41842 Visuospatial deficit: Secondary | ICD-10-CM | POA: Diagnosis not present

## 2021-09-13 ENCOUNTER — Encounter: Payer: Self-pay | Admitting: Adult Health

## 2021-09-13 ENCOUNTER — Non-Acute Institutional Stay (SKILLED_NURSING_FACILITY): Payer: PPO | Admitting: Adult Health

## 2021-09-13 DIAGNOSIS — R41842 Visuospatial deficit: Secondary | ICD-10-CM | POA: Diagnosis not present

## 2021-09-13 DIAGNOSIS — R293 Abnormal posture: Secondary | ICD-10-CM | POA: Diagnosis not present

## 2021-09-13 DIAGNOSIS — M6389 Disorders of muscle in diseases classified elsewhere, multiple sites: Secondary | ICD-10-CM | POA: Diagnosis not present

## 2021-09-13 DIAGNOSIS — F428 Other obsessive-compulsive disorder: Secondary | ICD-10-CM | POA: Diagnosis not present

## 2021-09-13 DIAGNOSIS — R0902 Hypoxemia: Secondary | ICD-10-CM

## 2021-09-13 DIAGNOSIS — G20 Parkinson's disease: Secondary | ICD-10-CM

## 2021-09-13 DIAGNOSIS — F02818 Dementia in other diseases classified elsewhere, unspecified severity, with other behavioral disturbance: Secondary | ICD-10-CM | POA: Diagnosis not present

## 2021-09-13 DIAGNOSIS — R2689 Other abnormalities of gait and mobility: Secondary | ICD-10-CM | POA: Diagnosis not present

## 2021-09-13 DIAGNOSIS — R296 Repeated falls: Secondary | ICD-10-CM | POA: Diagnosis not present

## 2021-09-13 DIAGNOSIS — M4802 Spinal stenosis, cervical region: Secondary | ICD-10-CM | POA: Diagnosis not present

## 2021-09-13 DIAGNOSIS — R634 Abnormal weight loss: Secondary | ICD-10-CM

## 2021-09-13 NOTE — Progress Notes (Signed)
[name], male   DOB: Oct 27, 1928, 88 y.o.   MRN: [id number]   Location:  [facility] Room Number: 277-O Place of Service:  SNF [phone]) Provider:  Royal Hawthorn, NP   Patient Care Team: [name], MD as PCP - General (Internal Medicine) [name], [name], MD as PCP - Cardiology (Cardiology) [name], [name], MD as Consulting Physician (Cardiology) [name], MD as Attending Physician (Gastroenterology) [name], MD (Inactive) as Consulting Physician (Neurology) Community, [facility] ([place]) Royal Hawthorn, NP as Nurse Practitioner (Nurse Practitioner)  Extended Emergency Contact Information Primary Emergency Contact: [name] Address: [address] [place] [place]          [place], [place] [address] [name] of [place] Phone: [phone] Mobile Phone: [phone] Relation: Spouse Secondary Emergency Contact: [name] Address: [address]          [place], [place] [address] [name] of [place] Phone: [phone] Relation: Daughter  Code Status:  DNR  Goals of care: Advanced Directive information    09/13/2021    2:29 PM  Advanced Directives  Does Patient Have a Medical Advance Directive? Yes  Type of Paramedic of [place];Living will;Out of facility DNR (pink MOST or yellow form)  Does patient want to make changes to medical advance directive? No - Patient declined  Copy of [name] in Chart? Yes - validated most recent copy scanned in chart (See row information)  Pre-existing out of facility DNR order (yellow form or pink MOST form) Yellow form placed in chart (order not valid for inpatient use)     Chief Complaint  Patient presents with   Acute Visit    Hypoxia     HPI:  Pt is a 88 y.o. male seen today for an acute visit for hypoxia.  Nurse says that he has had low oxygen in the 70-80s the last  two nights. No sob or cough. Recovered from covid in April and did require oxygen briefly , took paxlovid and dexamethasone.  Nurse is checking 02 each shift. Found to be weak in the bathroom and needed 3 person assist. Vitals were ok accept 02 sat of 76%. No sob noted. Oxygen started and sats above 90.  Nurse on days trialed him off oxygen and sats were 94%.   At this time he does not feel ill and is not having a cough or sob.  He was started on ativan bid for agitation. He has not appeared over sedated per staff.   Also he has had some generally weakness of the upper extremities and has needed more help with meals and ADls. Weakness is worse on the left arm with also some numbness and tingling. He denies any neck pain.  04/14 CT spine showed severe stenosis at C2-C3 and suspected degenerative spinal cord mass effect No further work up due to age and goals of care regarding the arm weakness.   Has been losing weight 7 lbs in the past two months.  Wt Readings from Last 3 Encounters:  09/13/21 154 lb (69.9 kg)  08/27/21 155 lb (70.3 kg)  07/31/21 161 lb 11.2 oz (73.3 kg)     Past Medical History:  Diagnosis Date   Cellulitis    Chronic diastolic CHF (congestive heart failure) ([place])    a. EF initially 35-40% after MI 1/09; b. echo 7/08: EF 60%;  c. 11/2014 Echo: EF 65-70%, Gr 1 DD, mild MR.  Coronary artery disease    a. s/p anterior STEMI 04/2007 with BMS-> LAD;  b. Cath 12/2011 patent stent;  c. low risk nuc 04/2014.   Crohn's disease ([place])    Diaphragmatic hernia without mention of obstruction or gangrene    Diverticulosis of colon (without mention of hemorrhage)    Fatty liver    a. on ultrasound of 10/2009   Flatulence, eructation, and gas pain    Fungal infection    Gait disorder    GERD (gastroesophageal reflux disease)    HOH (hard of hearing)    Hearing aids   Hyperlipidemia    Hypertension    Ischemic cardiomyopathy    a. EF initially 35-40% after MI 1/09; b. echo 7/08: EF  60%;  c. 11/2014 Echo: EF 65-70%, Gr 1 DD, mild MR.   Melanoma ([place])    Memory disorder    Orthostatic hypotension    Osteoporosis    Other chronic nonalcoholic liver disease    Other esophagitis    Parkinson's disease ([place])    RBBB 09/02/2013   Regional enteritis of small intestine (HCC)    Renal calculi    Restless leg syndrome 03/22/2015   Ulcerative (chronic) ileocolitis (HCC)    Urinary incontinence    Past Surgical History:  Procedure Laterality Date   APPENDECTOMY     BACK SURGERY     L3, L4, L5   CARDIAC CATHETERIZATION  09/25/06   EF 35-40% but more recently 60%   CATARACT EXTRACTION     Bilateral   CHOLECYSTECTOMY     Coronary artery stent placement     Melanoma resection     Partial bowel resection     TONSILLECTOMY      No Known Allergies  Outpatient Encounter Medications as of 09/13/2021  Medication Sig   acetaminophen (TYLENOL) 325 MG tablet Take 650 mg by mouth at bedtime.   aspirin EC 81 MG tablet Take 81 mg by mouth daily.   atorvastatin (LIPITOR) 40 MG tablet Take 40 mg by mouth daily.   calcium carbonate (OS-CAL) 600 MG TABS tablet Take 600 mg by mouth daily with breakfast.   carbidopa-levodopa (SINEMET CR) 50-200 MG tablet Take 1 tablet by mouth 4 (four) times daily.   carbidopa-levodopa (SINEMET IR) 25-100 MG tablet Take 1 tablet by mouth 4 (four) times daily.   cetirizine (ZYRTEC) 10 MG tablet Take 1 tablet (10 mg total) by mouth at bedtime.   Cholecalciferol (VITAMIN D3) 50 MCG (2000 UT) TABS Take 1 tablet by mouth daily.   cholestyramine (QUESTRAN) 4 g packet TAKE 1 PACKET BY MOUTH TWICE DAILY WITH A MEAL   diclofenac Sodium (VOLTAREN) 1 % GEL Apply topically 4 (four) times daily as needed (Apply to the affected right ankle).   donepezil (ARICEPT) 10 MG tablet TAKE ONE TABLET AT BEDTIME   Ensure (ENSURE) Take 237 mLs by mouth 2 (two) times daily as needed.   ipratropium (ATROVENT) 0.03 % nasal spray Place 2 sprays into both nostrils 3 (three) times  daily.   levalbuterol (XOPENEX HFA) 45 MCG/ACT inhaler Inhale 5 puffs into the lungs every 6 (six) hours as needed for wheezing.   lidocaine (XYLOCAINE) 2 % solution Use as directed 15 mLs in the mouth or throat as needed for mouth pain.   midodrine (PROAMATINE) 5 MG tablet Take 1 tablet (5 mg total) by mouth 2 (two) times daily with a meal.   multivitamin-iron-minerals-folic acid (CENTRUM) chewable tablet Chew 1 tablet by mouth daily.   nitroGLYCERIN (  NITROSTAT) 0.4 MG SL tablet Place 1 tablet (0.4 mg total) under the tongue every 5 (five) minutes as needed for chest pain. PT OVERDUE FOR OV PLEASE CALL FOR APPT   OXYGEN Inhale 2 L into the lungs.   pantoprazole (PROTONIX) 40 MG tablet Take 40 mg by mouth daily.   PARoxetine (PAXIL) 20 MG tablet Take 20 mg by mouth 2 (two) times daily.   potassium chloride SA (K-DUR,KLOR-CON) 20 MEQ tablet Take 40 mEq by mouth daily.   Probiotic Product (ALIGN) 4 MG CAPS Take 1 capsule by mouth daily.    sodium chloride (OCEAN) 0.65 % SOLN nasal spray Place 2 sprays into both nostrils daily as needed for congestion.    torsemide (DEMADEX) 20 MG tablet Take 1 tablet (20 mg total) by mouth daily.   vitamin B-12 (CYANOCOBALAMIN) 1000 MCG tablet Take 1,000 mcg by mouth daily.   [DISCONTINUED] acetaminophen (TYLENOL) 500 MG tablet Take 2 tablets (1,000 mg total) by mouth 3 (three) times daily as needed.   [DISCONTINUED] lactose free nutrition (BOOST) LIQD Take 237 mLs by mouth as needed.   No facility-administered encounter medications on file as of 09/13/2021.    Review of Systems  Constitutional:  Positive for unexpected weight change. Negative for activity change, appetite change, chills, diaphoresis, fatigue and fever.  Respiratory:  Negative for cough, shortness of breath, wheezing and stridor.   Cardiovascular:  Negative for chest pain, palpitations and leg swelling.  Gastrointestinal:  Negative for abdominal distention, abdominal pain, constipation and  diarrhea.  Genitourinary:  Negative for difficulty urinating and dysuria.  Musculoskeletal:  Positive for gait problem. Negative for arthralgias, back pain, joint swelling and myalgias.  Neurological:  Positive for weakness. Negative for dizziness, seizures, syncope, facial asymmetry, speech difficulty and headaches.  Hematological:  Negative for adenopathy. Does not bruise/bleed easily.  Psychiatric/Behavioral:  Positive for agitation, behavioral problems and confusion.     Immunization History  Administered Date(s) Administered   Influenza, High Dose Seasonal PF 01/21/2019, 01/10/2021   Influenza-Unspecified 12/06/2014, 11/27/2017, 02/01/2020   Moderna Covid-19 Vaccine Bivalent Booster 78yr & up 01/17/2021   Moderna SARS-COV2 Booster Vaccination 11/28/2020   Moderna Sars-Covid-2 Vaccination 04/13/2019, 05/11/2019, 02/18/2020   Pneumococcal Conjugate-13 05/21/2018   Pneumococcal Polysaccharide-23 05/21/2005   Tdap 06/03/2006, 10/22/2018   Zoster Recombinat (Shingrix) 10/22/2018, 12/24/2018   Pertinent  Health Maintenance Due  Topic Date Due   INFLUENZA VACCINE  11/06/2021      05/12/2018    3:21 PM 10/22/2018    2:23 PM 07/20/2019   10:06 AM 11/11/2019   10:53 AM 12/07/2020    1:38 PM  Fall Risk  Falls in the past year? 1 1  1 1   Was there an injury with Fall? 0 1  1 1   Fall Risk Category Calculator 2 3  2 3   Fall Risk Category Moderate High  Moderate High  Patient Fall Risk Level Moderate fall risk   High fall risk High fall risk  Patient at Risk for Falls Due to Impaired mobility;History of fall(s);Impaired balance/gait;Mental status change;Medication side effect History of fall(s);Impaired mobility History of fall(s);Impaired balance/gait;Impaired mobility;Mental status change;Medication side effect History of fall(s);Impaired balance/gait Impaired balance/gait;History of fall(s)  Fall risk Follow up Falls evaluation completed;Falls prevention discussed;Education provided Falls  evaluation completed;Falls prevention discussed Education provided;Falls prevention discussed;Falls evaluation completed Falls evaluation completed Falls evaluation completed;Follow up appointment  Fall risk Follow up - Comments   [place]:    Vitals:  09/13/21 1358  BP: 128/78  Pulse: 80  Resp: 18  Temp: 97.6 F (36.4 C)  SpO2: 93%  Weight: 154 lb (69.9 kg)  Height: 5' 9"  (1.753 m)   Body mass index is 22.74 kg/m. Physical Exam Vitals and nursing note reviewed.  Constitutional:      General: He is not in acute distress.    Appearance: He is not diaphoretic.  HENT:     Head: Normocephalic and atraumatic.  Neck:     Thyroid: No thyromegaly.     Vascular: No JVD.     Trachea: No tracheal deviation.  Cardiovascular:     Rate and Rhythm: Normal rate and regular rhythm.     Heart sounds: No murmur heard. Pulmonary:     Effort: Pulmonary effort is normal. No respiratory distress.     Breath sounds: Normal breath sounds. No wheezing.  Abdominal:     General: Bowel sounds are normal. There is no distension.     Palpations: Abdomen is soft.     Tenderness: There is no abdominal tenderness.  Musculoskeletal:     Right lower leg: No edema.     Left lower leg: No edema.  Lymphadenopathy:     Cervical: No cervical adenopathy.  Skin:    General: Skin is warm and dry.  Neurological:     General: No focal deficit present.     Mental Status: He is alert. Mental status is at baseline.     Cranial Nerves: No cranial nerve deficit.     Comments: Left wrist drop and weakness of the RUE. Does have full ROM at the shoulder on the left.      Labs reviewed: Recent Labs    06/04/21 0000 07/27/21 0000 08/03/21 0000  NA 141 143 141  K 4.5 3.7 3.9  CL 99 101 102  CO2 31* 26* 23*  BUN 18 25* 23*  CREATININE 1.1 1.2 1.0  CALCIUM 8.4* 8.4* 8.7   Recent Labs    06/04/21 0000  AST 26  ALT 12  ALKPHOS 71  ALBUMIN 4.4   Recent Labs     06/04/21 0000 07/27/21 0000 08/03/21 0000  WBC 7.4 6.5 10.5  NEUTROABS 4.40  --   --   HGB 12.9* 13.3* 13.1*  HCT 38* 39* 39*  PLT 245 191 260   Lab Results  Component Value Date   TSH 3.60 10/16/2020   No results found for: "HGBA1C" Lab Results  Component Value Date   CHOL 119 05/10/2021   HDL 56 05/10/2021   LDLCALC 48 05/10/2021   TRIG 76 05/10/2021    Significant Diagnostic Results in last 30 days:  No results found.  Assessment/Plan  1. Hypoxia Resolved ?if the readings were correct as he has had issues with correct readings due to cold fingers associated with the cervical stenosis Will check CXR  2. Dementia due to Parkinson's disease with behavioral disturbance ([place]) Progressing with increased agitation Followed by Dr Casimiro Needle, ativan scheduled now.  Does not appear sedated  3. Cervical spinal stenosis No pain but is experiencing weakness Followed by OT Continue supportive care only  4. Weight loss Due to recent acute illness and progressive dementia Ensure is ordered bid prn    Labs/tests ordered:  CXR

## 2021-09-14 DIAGNOSIS — M4802 Spinal stenosis, cervical region: Secondary | ICD-10-CM | POA: Insufficient documentation

## 2021-09-14 DIAGNOSIS — R0602 Shortness of breath: Secondary | ICD-10-CM | POA: Diagnosis not present

## 2021-09-17 DIAGNOSIS — R293 Abnormal posture: Secondary | ICD-10-CM | POA: Diagnosis not present

## 2021-09-17 DIAGNOSIS — G20 Parkinson's disease: Secondary | ICD-10-CM | POA: Diagnosis not present

## 2021-09-17 DIAGNOSIS — R41842 Visuospatial deficit: Secondary | ICD-10-CM | POA: Diagnosis not present

## 2021-09-17 DIAGNOSIS — M6389 Disorders of muscle in diseases classified elsewhere, multiple sites: Secondary | ICD-10-CM | POA: Diagnosis not present

## 2021-09-17 DIAGNOSIS — R2689 Other abnormalities of gait and mobility: Secondary | ICD-10-CM | POA: Diagnosis not present

## 2021-09-17 DIAGNOSIS — F428 Other obsessive-compulsive disorder: Secondary | ICD-10-CM | POA: Diagnosis not present

## 2021-09-17 DIAGNOSIS — R296 Repeated falls: Secondary | ICD-10-CM | POA: Diagnosis not present

## 2021-09-18 DIAGNOSIS — F428 Other obsessive-compulsive disorder: Secondary | ICD-10-CM | POA: Diagnosis not present

## 2021-09-18 DIAGNOSIS — M6389 Disorders of muscle in diseases classified elsewhere, multiple sites: Secondary | ICD-10-CM | POA: Diagnosis not present

## 2021-09-18 DIAGNOSIS — R293 Abnormal posture: Secondary | ICD-10-CM | POA: Diagnosis not present

## 2021-09-18 DIAGNOSIS — R296 Repeated falls: Secondary | ICD-10-CM | POA: Diagnosis not present

## 2021-09-18 DIAGNOSIS — G20 Parkinson's disease: Secondary | ICD-10-CM | POA: Diagnosis not present

## 2021-09-18 DIAGNOSIS — R41842 Visuospatial deficit: Secondary | ICD-10-CM | POA: Diagnosis not present

## 2021-09-18 DIAGNOSIS — R2689 Other abnormalities of gait and mobility: Secondary | ICD-10-CM | POA: Diagnosis not present

## 2021-09-19 DIAGNOSIS — R293 Abnormal posture: Secondary | ICD-10-CM | POA: Diagnosis not present

## 2021-09-19 DIAGNOSIS — R296 Repeated falls: Secondary | ICD-10-CM | POA: Diagnosis not present

## 2021-09-19 DIAGNOSIS — F428 Other obsessive-compulsive disorder: Secondary | ICD-10-CM | POA: Diagnosis not present

## 2021-09-19 DIAGNOSIS — G20 Parkinson's disease: Secondary | ICD-10-CM | POA: Diagnosis not present

## 2021-09-19 DIAGNOSIS — R41842 Visuospatial deficit: Secondary | ICD-10-CM | POA: Diagnosis not present

## 2021-09-19 DIAGNOSIS — R2689 Other abnormalities of gait and mobility: Secondary | ICD-10-CM | POA: Diagnosis not present

## 2021-09-19 DIAGNOSIS — M6389 Disorders of muscle in diseases classified elsewhere, multiple sites: Secondary | ICD-10-CM | POA: Diagnosis not present

## 2021-09-20 DIAGNOSIS — R293 Abnormal posture: Secondary | ICD-10-CM | POA: Diagnosis not present

## 2021-09-20 DIAGNOSIS — R41842 Visuospatial deficit: Secondary | ICD-10-CM | POA: Diagnosis not present

## 2021-09-20 DIAGNOSIS — R296 Repeated falls: Secondary | ICD-10-CM | POA: Diagnosis not present

## 2021-09-20 DIAGNOSIS — F428 Other obsessive-compulsive disorder: Secondary | ICD-10-CM | POA: Diagnosis not present

## 2021-09-20 DIAGNOSIS — M6389 Disorders of muscle in diseases classified elsewhere, multiple sites: Secondary | ICD-10-CM | POA: Diagnosis not present

## 2021-09-20 DIAGNOSIS — R2689 Other abnormalities of gait and mobility: Secondary | ICD-10-CM | POA: Diagnosis not present

## 2021-09-20 DIAGNOSIS — G20 Parkinson's disease: Secondary | ICD-10-CM | POA: Diagnosis not present

## 2021-09-21 DIAGNOSIS — G20 Parkinson's disease: Secondary | ICD-10-CM | POA: Diagnosis not present

## 2021-09-21 DIAGNOSIS — R41842 Visuospatial deficit: Secondary | ICD-10-CM | POA: Diagnosis not present

## 2021-09-21 DIAGNOSIS — R293 Abnormal posture: Secondary | ICD-10-CM | POA: Diagnosis not present

## 2021-09-21 DIAGNOSIS — M6389 Disorders of muscle in diseases classified elsewhere, multiple sites: Secondary | ICD-10-CM | POA: Diagnosis not present

## 2021-09-21 DIAGNOSIS — F428 Other obsessive-compulsive disorder: Secondary | ICD-10-CM | POA: Diagnosis not present

## 2021-09-21 DIAGNOSIS — R296 Repeated falls: Secondary | ICD-10-CM | POA: Diagnosis not present

## 2021-09-21 DIAGNOSIS — R2689 Other abnormalities of gait and mobility: Secondary | ICD-10-CM | POA: Diagnosis not present

## 2021-09-24 DIAGNOSIS — R2689 Other abnormalities of gait and mobility: Secondary | ICD-10-CM | POA: Diagnosis not present

## 2021-09-24 DIAGNOSIS — M6389 Disorders of muscle in diseases classified elsewhere, multiple sites: Secondary | ICD-10-CM | POA: Diagnosis not present

## 2021-09-24 DIAGNOSIS — H02105 Unspecified ectropion of left lower eyelid: Secondary | ICD-10-CM | POA: Diagnosis not present

## 2021-09-24 DIAGNOSIS — Z961 Presence of intraocular lens: Secondary | ICD-10-CM | POA: Diagnosis not present

## 2021-09-24 DIAGNOSIS — F428 Other obsessive-compulsive disorder: Secondary | ICD-10-CM | POA: Diagnosis not present

## 2021-09-24 DIAGNOSIS — R293 Abnormal posture: Secondary | ICD-10-CM | POA: Diagnosis not present

## 2021-09-24 DIAGNOSIS — R296 Repeated falls: Secondary | ICD-10-CM | POA: Diagnosis not present

## 2021-09-24 DIAGNOSIS — H52203 Unspecified astigmatism, bilateral: Secondary | ICD-10-CM | POA: Diagnosis not present

## 2021-09-24 DIAGNOSIS — H02102 Unspecified ectropion of right lower eyelid: Secondary | ICD-10-CM | POA: Diagnosis not present

## 2021-09-24 DIAGNOSIS — G20 Parkinson's disease: Secondary | ICD-10-CM | POA: Diagnosis not present

## 2021-09-24 DIAGNOSIS — R41842 Visuospatial deficit: Secondary | ICD-10-CM | POA: Diagnosis not present

## 2021-09-25 DIAGNOSIS — F428 Other obsessive-compulsive disorder: Secondary | ICD-10-CM | POA: Diagnosis not present

## 2021-09-25 DIAGNOSIS — R2689 Other abnormalities of gait and mobility: Secondary | ICD-10-CM | POA: Diagnosis not present

## 2021-09-25 DIAGNOSIS — R293 Abnormal posture: Secondary | ICD-10-CM | POA: Diagnosis not present

## 2021-09-25 DIAGNOSIS — M6389 Disorders of muscle in diseases classified elsewhere, multiple sites: Secondary | ICD-10-CM | POA: Diagnosis not present

## 2021-09-25 DIAGNOSIS — R296 Repeated falls: Secondary | ICD-10-CM | POA: Diagnosis not present

## 2021-09-25 DIAGNOSIS — R41842 Visuospatial deficit: Secondary | ICD-10-CM | POA: Diagnosis not present

## 2021-09-25 DIAGNOSIS — G20 Parkinson's disease: Secondary | ICD-10-CM | POA: Diagnosis not present

## 2021-09-27 ENCOUNTER — Non-Acute Institutional Stay (SKILLED_NURSING_FACILITY): Payer: PPO | Admitting: Adult Health

## 2021-09-27 ENCOUNTER — Encounter: Payer: Self-pay | Admitting: Adult Health

## 2021-09-27 DIAGNOSIS — R293 Abnormal posture: Secondary | ICD-10-CM | POA: Diagnosis not present

## 2021-09-27 DIAGNOSIS — R41842 Visuospatial deficit: Secondary | ICD-10-CM | POA: Diagnosis not present

## 2021-09-27 DIAGNOSIS — M6389 Disorders of muscle in diseases classified elsewhere, multiple sites: Secondary | ICD-10-CM | POA: Diagnosis not present

## 2021-09-27 DIAGNOSIS — F428 Other obsessive-compulsive disorder: Secondary | ICD-10-CM | POA: Diagnosis not present

## 2021-09-27 DIAGNOSIS — G20 Parkinson's disease: Secondary | ICD-10-CM | POA: Diagnosis not present

## 2021-09-27 DIAGNOSIS — R2689 Other abnormalities of gait and mobility: Secondary | ICD-10-CM | POA: Diagnosis not present

## 2021-09-27 DIAGNOSIS — R600 Localized edema: Secondary | ICD-10-CM | POA: Diagnosis not present

## 2021-09-27 DIAGNOSIS — R296 Repeated falls: Secondary | ICD-10-CM | POA: Diagnosis not present

## 2021-09-27 NOTE — Progress Notes (Signed)
Location:  [facility] Room Number: 133/A Place of Service:  SNF [phone]) Provider: Royal Hawthorn, NP   Patient Care Team: [name], MD as PCP - General (Internal Medicine) [name], [name], MD as PCP - Cardiology (Cardiology) [name], [name], MD as Consulting Physician (Cardiology) [name], MD as Attending Physician (Gastroenterology) [name], MD (Inactive) as Consulting Physician (Neurology) Community, [facility] ([place]) Royal Hawthorn, NP as Nurse Practitioner (Nurse Practitioner)  Extended Emergency Contact Information Primary Emergency Contact: [name] Address: [address] [place] [place]          [place], [place] [address] [name] of [place] Phone: [phone] Mobile Phone: [phone] Relation: Spouse Secondary Emergency Contact: [name] Address: [address]          [place], [place] [address] [name] of [place] Phone: [phone] Relation: Daughter  Code Status:  DNR Goals of care: Advanced Directive information    09/27/2021   10:58 AM  Advanced Directives  Does Patient Have a Medical Advance Directive? Yes  Type of Paramedic of [place];Living will;Out of facility DNR (pink MOST or yellow form)  Does patient want to make changes to medical advance directive? No - Patient declined  Copy of [place] in Chart? Yes - validated most recent copy scanned in chart (See row information)  Pre-existing out of facility DNR order (yellow form or pink MOST form) Yellow form placed in chart (order not valid for inpatient use)     Chief Complaint  Patient presents with   Acute Visit    Edema    HPI:  Pt is a 88 y.o. male seen today for an acute visit for edema. The nurse reports increased edema and mild redness to BLE. No sob. He is on torsemide 20 mg daily. Keeps feet dependent. Less mobile due to  progression in dementia, cervical stenosis, and gait issues. Has been gradually losing weight but now this has stabilized. Noted hx of CHF but no recent exacerbation. Had a possible episode of hypoxia 09/13/21 but CXR returned normal. 02 sat readings for him are sometimes difficulty due to cold fingers with discoloration. At this time sats are WNL.  Wt Readings from Last 3 Encounters:  09/27/21 156 lb 12.8 oz (71.1 kg)  09/13/21 154 lb (69.9 kg)  08/27/21 155 lb (70.3 kg)      Past Medical History:  Diagnosis Date   Cellulitis    Chronic diastolic CHF (congestive heart failure) ([place])    a. EF initially 35-40% after MI 1/09; b. echo 7/08: EF 60%;  c. 11/2014 Echo: EF 65-70%, Gr 1 DD, mild MR.   Coronary artery disease    a. s/p anterior STEMI 04/2007 with BMS-> LAD;  b. Cath 12/2011 patent stent;  c. low risk nuc 04/2014.   Crohn's disease ([place])    Diaphragmatic hernia without mention of obstruction or gangrene    Diverticulosis of colon (without mention of hemorrhage)    Fatty liver    a. on ultrasound of 10/2009   Flatulence, eructation, and gas pain    Fungal infection    Gait disorder    GERD (gastroesophageal reflux disease)    HOH (hard of hearing)    Hearing aids   Hyperlipidemia    Hypertension    Ischemic cardiomyopathy    a. EF initially 35-40% after MI 1/09; b. echo 7/08: EF 60%;  c. 11/2014 Echo: EF 65-70%, Gr 1 DD, mild  MR.   Melanoma ([place])    Memory disorder    Orthostatic hypotension    Osteoporosis    Other chronic nonalcoholic liver disease    Other esophagitis    Parkinson's disease ([place])    RBBB 09/02/2013   Regional enteritis of small intestine ([place])    Renal calculi    Restless leg syndrome 03/22/2015   Ulcerative (chronic) ileocolitis (HCC)    Urinary incontinence    Past Surgical History:  Procedure Laterality Date   APPENDECTOMY     BACK SURGERY     L3, L4, L5   CARDIAC CATHETERIZATION  09/25/06   EF 35-40% but more recently 60%   CATARACT EXTRACTION      Bilateral   CHOLECYSTECTOMY     Coronary artery stent placement     Melanoma resection     Partial bowel resection     TONSILLECTOMY      No Known Allergies  Outpatient Encounter Medications as of 09/27/2021  Medication Sig   acetaminophen (TYLENOL) 325 MG tablet Take 650 mg by mouth at bedtime as needed for mild pain.   aspirin EC 81 MG tablet Take 81 mg by mouth daily.   atorvastatin (LIPITOR) 40 MG tablet Take 40 mg by mouth daily.   calcium carbonate (OS-CAL) 600 MG TABS tablet Take 600 mg by mouth daily with breakfast.   carbidopa-levodopa (SINEMET CR) 50-200 MG tablet Take 1 tablet by mouth 4 (four) times daily.   carbidopa-levodopa (SINEMET IR) 25-100 MG tablet Take 1 tablet by mouth 4 (four) times daily.   cetirizine (ZYRTEC) 10 MG tablet Take 1 tablet (10 mg total) by mouth at bedtime.   Cholecalciferol (VITAMIN D3) 50 MCG (2000 UT) TABS Take 1 tablet by mouth daily.   cholestyramine (QUESTRAN) 4 g packet TAKE 1 PACKET BY MOUTH TWICE DAILY WITH A MEAL   diclofenac Sodium (VOLTAREN) 1 % GEL Apply topically 3 (three) times daily as needed (Apply to the affected right ankle).   donepezil (ARICEPT) 10 MG tablet TAKE ONE TABLET AT BEDTIME   Ensure (ENSURE) Take 237 mLs by mouth 2 (two) times daily as needed.   ipratropium (ATROVENT) 0.03 % nasal spray Place 2 sprays into both nostrils 3 (three) times daily.   levalbuterol (XOPENEX HFA) 45 MCG/ACT inhaler Inhale 5 puffs into the lungs every 6 (six) hours as needed for wheezing.   lidocaine (XYLOCAINE) 2 % solution Use as directed 15 mLs in the mouth or throat as needed for mouth pain.   LORazepam (ATIVAN) 0.5 MG tablet Take 0.5 mg by mouth 2 (two) times daily as needed.   midodrine (PROAMATINE) 5 MG tablet Take 1 tablet (5 mg total) by mouth 2 (two) times daily with a meal.   multivitamin-iron-minerals-folic acid (CENTRUM) chewable tablet Chew 1 tablet by mouth daily.   nitroGLYCERIN (NITROSTAT) 0.4 MG SL tablet Place 1 tablet (0.4  mg total) under the tongue every 5 (five) minutes as needed for chest pain. PT OVERDUE FOR OV PLEASE CALL FOR APPT   OXYGEN Inhale 2 L into the lungs.   pantoprazole (PROTONIX) 40 MG tablet Take 40 mg by mouth daily.   PARoxetine (PAXIL) 20 MG tablet Take 20 mg by mouth 2 (two) times daily.   potassium chloride SA (K-DUR,KLOR-CON) 20 MEQ tablet Take 40 mEq by mouth daily.   Probiotic Product (ALIGN) 4 MG CAPS Take 1 capsule by mouth daily.    sodium chloride (OCEAN) 0.65 % SOLN nasal spray Place 2 sprays into both nostrils  daily as needed for congestion.    torsemide (DEMADEX) 20 MG tablet Take 1 tablet (20 mg total) by mouth daily.   vitamin B-12 (CYANOCOBALAMIN) 1000 MCG tablet Take 1,000 mcg by mouth daily.   No facility-administered encounter medications on file as of 09/27/2021.    Review of Systems  Constitutional:  Positive for activity change. Negative for appetite change, chills, diaphoresis, fatigue, fever and unexpected weight change.  Respiratory:  Negative for cough, shortness of breath, wheezing and stridor.   Cardiovascular:  Positive for leg swelling. Negative for chest pain and palpitations.  Gastrointestinal:  Negative for abdominal distention, abdominal pain, constipation and diarrhea.  Genitourinary:  Negative for difficulty urinating and dysuria.  Musculoskeletal:  Positive for gait problem. Negative for back pain, joint swelling and myalgias.  Neurological:  Positive for weakness. Negative for dizziness, seizures, syncope, facial asymmetry, speech difficulty and headaches.  Hematological:  Negative for adenopathy. Does not bruise/bleed easily.  Psychiatric/Behavioral:  Positive for confusion. Negative for agitation.     Immunization History  Administered Date(s) Administered   Influenza, High Dose Seasonal PF 01/21/2019, 01/10/2021   Influenza-Unspecified 12/06/2014, 11/27/2017, 02/01/2020   Moderna Covid-19 Vaccine Bivalent Booster 25yr & up 01/17/2021   Moderna  SARS-COV2 Booster Vaccination 11/28/2020   Moderna Sars-Covid-2 Vaccination 04/13/2019, 05/11/2019, 02/18/2020   Pneumococcal Conjugate-13 05/21/2018   Pneumococcal Polysaccharide-23 05/21/2005   Tdap 06/03/2006, 10/22/2018   Zoster Recombinat (Shingrix) 10/22/2018, 12/24/2018   Pertinent  Health Maintenance Due  Topic Date Due   INFLUENZA VACCINE  11/06/2021      05/12/2018    3:21 PM 10/22/2018    2:23 PM 07/20/2019   10:06 AM 11/11/2019   10:53 AM 12/07/2020    1:38 PM  Fall Risk  Falls in the past year? 1 1  1 1   Was there an injury with Fall? 0 1  1 1   Fall Risk Category Calculator 2 3  2 3   Fall Risk Category Moderate High  Moderate High  Patient Fall Risk Level Moderate fall risk   High fall risk High fall risk  Patient at Risk for Falls Due to Impaired mobility;History of fall(s);Impaired balance/gait;Mental status change;Medication side effect History of fall(s);Impaired mobility History of fall(s);Impaired balance/gait;Impaired mobility;Mental status change;Medication side effect History of fall(s);Impaired balance/gait Impaired balance/gait;History of fall(s)  Fall risk Follow up Falls evaluation completed;Falls prevention discussed;Education provided Falls evaluation completed;Falls prevention discussed Education provided;Falls prevention discussed;Falls evaluation completed Falls evaluation completed Falls evaluation completed;Follow up appointment  Fall risk Follow up - Comments   at [facility]    Vitals:   09/27/21 1047  BP: 100/62  Pulse: 100  Resp: 18  Temp: 97.9 F (36.6 C)  SpO2: 90%  Weight: 156 lb 12.8 oz (71.1 kg)  Height: 5' 9.8" (1.773 m)   Body mass index is 22.63 kg/m. Physical Exam Cardiovascular:     Rate and Rhythm: Normal rate and regular rhythm.     Heart sounds: No murmur heard. Pulmonary:     Effort: Pulmonary effort is normal. No respiratory distress.     Breath sounds: Normal breath sounds.  Musculoskeletal:      Right lower leg: Edema (+2) present.     Left lower leg: Edema (+1) present.     Comments: Bilateral calves not tender to palpation, no warmth. Very mild erythema.      Labs reviewed: Recent Labs    06/04/21 0000 07/27/21 0000 08/03/21 0000  NA 141 143 141  K 4.5  3.7 3.9  CL 99 101 102  CO2 31* 26* 23*  BUN 18 25* 23*  CREATININE 1.1 1.2 1.0  CALCIUM 8.4* 8.4* 8.7   Recent Labs    06/04/21 0000  AST 26  ALT 12  ALKPHOS 71  ALBUMIN 4.4   Recent Labs    06/04/21 0000 07/27/21 0000 08/03/21 0000  WBC 7.4 6.5 10.5  NEUTROABS 4.40  --   --   HGB 12.9* 13.3* 13.1*  HCT 38* 39* 39*  PLT 245 191 260   Lab Results  Component Value Date   TSH 3.60 10/16/2020   No results found for: "HGBA1C" Lab Results  Component Value Date   CHOL 119 05/10/2021   HDL 56 05/10/2021   LDLCALC 48 05/10/2021   TRIG 76 05/10/2021    Significant Diagnostic Results in last 30 days:  No results found.  Assessment/Plan  1. Localized edema Recommend compression hose and elevation If worsening could consider increased torsemide dosing  Will check labs to eval liver, renal, and protein  Labs/tests ordered:  BMP and liver function panel with albumin

## 2021-10-01 ENCOUNTER — Telehealth: Payer: Self-pay | Admitting: Internal Medicine

## 2021-10-01 DIAGNOSIS — R609 Edema, unspecified: Secondary | ICD-10-CM | POA: Diagnosis not present

## 2021-10-01 LAB — BASIC METABOLIC PANEL
BUN: 15 (ref 4–21)
CO2: 27 — AB (ref 13–22)
Chloride: 100 (ref 99–108)
Creatinine: 1.1 (ref 0.6–1.3)
Glucose: 140
Potassium: 3.7 mEq/L (ref 3.5–5.1)
Sodium: 139 (ref 137–147)

## 2021-10-01 LAB — COMPREHENSIVE METABOLIC PANEL
Albumin: 3.9 (ref 3.5–5.0)
Calcium: 5.8 — AB (ref 8.7–10.7)

## 2021-10-01 LAB — HEPATIC FUNCTION PANEL
ALT: 9 U/L — AB (ref 10–40)
AST: 27 (ref 14–40)
Alkaline Phosphatase: 92 (ref 25–125)
Bilirubin, Total: 1.2

## 2021-10-01 NOTE — Telephone Encounter (Signed)
Critical Level called for low Calcium of 5.8 with Albumin of 3.9 ? Etiology. Checked twice Calcium levels normal few weeks ago Patient Asymptomatic Vitals Normal Range Will increase his PO calcium to 600 mg TID Change Vit D to 5000 Units Check BMP and Vit D level with Magnesium in 2 days If any change in condition discuss about sending to ED  Amy can you follow him tomorrow?

## 2021-10-02 ENCOUNTER — Non-Acute Institutional Stay (SKILLED_NURSING_FACILITY): Payer: PPO | Admitting: Orthopedic Surgery

## 2021-10-02 ENCOUNTER — Encounter: Payer: Self-pay | Admitting: Orthopedic Surgery

## 2021-10-02 DIAGNOSIS — R293 Abnormal posture: Secondary | ICD-10-CM | POA: Diagnosis not present

## 2021-10-02 DIAGNOSIS — G20 Parkinson's disease: Secondary | ICD-10-CM | POA: Diagnosis not present

## 2021-10-02 DIAGNOSIS — S51811A Laceration without foreign body of right forearm, initial encounter: Secondary | ICD-10-CM

## 2021-10-02 DIAGNOSIS — F428 Other obsessive-compulsive disorder: Secondary | ICD-10-CM | POA: Diagnosis not present

## 2021-10-02 DIAGNOSIS — R2689 Other abnormalities of gait and mobility: Secondary | ICD-10-CM | POA: Diagnosis not present

## 2021-10-02 DIAGNOSIS — F429 Obsessive-compulsive disorder, unspecified: Secondary | ICD-10-CM

## 2021-10-02 DIAGNOSIS — W19XXXA Unspecified fall, initial encounter: Secondary | ICD-10-CM | POA: Diagnosis not present

## 2021-10-02 DIAGNOSIS — F02818 Dementia in other diseases classified elsewhere, unspecified severity, with other behavioral disturbance: Secondary | ICD-10-CM | POA: Diagnosis not present

## 2021-10-02 DIAGNOSIS — M6389 Disorders of muscle in diseases classified elsewhere, multiple sites: Secondary | ICD-10-CM | POA: Diagnosis not present

## 2021-10-02 DIAGNOSIS — R41842 Visuospatial deficit: Secondary | ICD-10-CM | POA: Diagnosis not present

## 2021-10-02 DIAGNOSIS — R296 Repeated falls: Secondary | ICD-10-CM | POA: Diagnosis not present

## 2021-10-03 DIAGNOSIS — R2689 Other abnormalities of gait and mobility: Secondary | ICD-10-CM | POA: Diagnosis not present

## 2021-10-03 DIAGNOSIS — M6389 Disorders of muscle in diseases classified elsewhere, multiple sites: Secondary | ICD-10-CM | POA: Diagnosis not present

## 2021-10-03 DIAGNOSIS — R296 Repeated falls: Secondary | ICD-10-CM | POA: Diagnosis not present

## 2021-10-03 DIAGNOSIS — F428 Other obsessive-compulsive disorder: Secondary | ICD-10-CM | POA: Diagnosis not present

## 2021-10-03 DIAGNOSIS — G20 Parkinson's disease: Secondary | ICD-10-CM | POA: Diagnosis not present

## 2021-10-03 DIAGNOSIS — R41842 Visuospatial deficit: Secondary | ICD-10-CM | POA: Diagnosis not present

## 2021-10-03 DIAGNOSIS — R293 Abnormal posture: Secondary | ICD-10-CM | POA: Diagnosis not present

## 2021-10-03 LAB — BASIC METABOLIC PANEL
BUN: 13 (ref 4–21)
CO2: 26 — AB (ref 13–22)
Chloride: 100 (ref 99–108)
Creatinine: 1 (ref 0.6–1.3)
Glucose: 91
Potassium: 4 mEq/L (ref 3.5–5.1)
Sodium: 142 (ref 137–147)

## 2021-10-03 LAB — COMPREHENSIVE METABOLIC PANEL: Calcium: 6 — AB (ref 8.7–10.7)

## 2021-10-03 LAB — VITAMIN D 25 HYDROXY (VIT D DEFICIENCY, FRACTURES): Vit D, 25-Hydroxy: 27.2

## 2021-10-03 LAB — MAGNESIUM: Magnesium: 0.7

## 2021-10-04 DIAGNOSIS — R293 Abnormal posture: Secondary | ICD-10-CM | POA: Diagnosis not present

## 2021-10-04 DIAGNOSIS — M6389 Disorders of muscle in diseases classified elsewhere, multiple sites: Secondary | ICD-10-CM | POA: Diagnosis not present

## 2021-10-04 DIAGNOSIS — R2689 Other abnormalities of gait and mobility: Secondary | ICD-10-CM | POA: Diagnosis not present

## 2021-10-04 DIAGNOSIS — R296 Repeated falls: Secondary | ICD-10-CM | POA: Diagnosis not present

## 2021-10-04 DIAGNOSIS — G20 Parkinson's disease: Secondary | ICD-10-CM | POA: Diagnosis not present

## 2021-10-04 DIAGNOSIS — F428 Other obsessive-compulsive disorder: Secondary | ICD-10-CM | POA: Diagnosis not present

## 2021-10-04 DIAGNOSIS — R41842 Visuospatial deficit: Secondary | ICD-10-CM | POA: Diagnosis not present

## 2021-10-05 DIAGNOSIS — F605 Obsessive-compulsive personality disorder: Secondary | ICD-10-CM | POA: Diagnosis not present

## 2021-10-05 DIAGNOSIS — I129 Hypertensive chronic kidney disease with stage 1 through stage 4 chronic kidney disease, or unspecified chronic kidney disease: Secondary | ICD-10-CM | POA: Diagnosis not present

## 2021-10-05 DIAGNOSIS — R293 Abnormal posture: Secondary | ICD-10-CM | POA: Diagnosis not present

## 2021-10-05 DIAGNOSIS — K760 Fatty (change of) liver, not elsewhere classified: Secondary | ICD-10-CM | POA: Diagnosis not present

## 2021-10-05 DIAGNOSIS — R2689 Other abnormalities of gait and mobility: Secondary | ICD-10-CM | POA: Diagnosis not present

## 2021-10-05 DIAGNOSIS — F428 Other obsessive-compulsive disorder: Secondary | ICD-10-CM | POA: Diagnosis not present

## 2021-10-05 DIAGNOSIS — R41842 Visuospatial deficit: Secondary | ICD-10-CM | POA: Diagnosis not present

## 2021-10-05 DIAGNOSIS — M6389 Disorders of muscle in diseases classified elsewhere, multiple sites: Secondary | ICD-10-CM | POA: Diagnosis not present

## 2021-10-05 DIAGNOSIS — R296 Repeated falls: Secondary | ICD-10-CM | POA: Diagnosis not present

## 2021-10-05 DIAGNOSIS — G20 Parkinson's disease: Secondary | ICD-10-CM | POA: Diagnosis not present

## 2021-10-05 LAB — BASIC METABOLIC PANEL
BUN: 13 (ref 4–21)
CO2: 26 — AB (ref 13–22)
Chloride: 101 (ref 99–108)
Creatinine: 0.9 (ref 0.6–1.3)
Glucose: 109
Potassium: 3.8 mEq/L (ref 3.5–5.1)
Sodium: 143 (ref 137–147)

## 2021-10-05 LAB — HEPATIC FUNCTION PANEL
ALT: 6 U/L — AB (ref 10–40)
AST: 24 (ref 14–40)
Alkaline Phosphatase: 99 (ref 25–125)
Bilirubin, Direct: 0.37 (ref 0.01–0.4)
Bilirubin, Total: 1.4

## 2021-10-05 LAB — COMPREHENSIVE METABOLIC PANEL
Albumin: 4.2 (ref 3.5–5.0)
Calcium: 6.6 — AB (ref 8.7–10.7)

## 2021-10-08 ENCOUNTER — Encounter: Payer: Self-pay | Admitting: Adult Health

## 2021-10-08 ENCOUNTER — Non-Acute Institutional Stay (SKILLED_NURSING_FACILITY): Payer: PPO | Admitting: Adult Health

## 2021-10-08 DIAGNOSIS — W19XXXA Unspecified fall, initial encounter: Secondary | ICD-10-CM | POA: Diagnosis not present

## 2021-10-08 DIAGNOSIS — I10 Essential (primary) hypertension: Secondary | ICD-10-CM | POA: Diagnosis not present

## 2021-10-08 DIAGNOSIS — T148XXA Other injury of unspecified body region, initial encounter: Secondary | ICD-10-CM | POA: Diagnosis not present

## 2021-10-08 DIAGNOSIS — M7989 Other specified soft tissue disorders: Secondary | ICD-10-CM | POA: Diagnosis not present

## 2021-10-08 DIAGNOSIS — E559 Vitamin D deficiency, unspecified: Secondary | ICD-10-CM

## 2021-10-08 LAB — BASIC METABOLIC PANEL
BUN: 7 (ref 4–21)
CO2: 23 — AB (ref 13–22)
Chloride: 108 (ref 99–108)
Creatinine: 0.7 (ref 0.6–1.3)
Glucose: 104
Potassium: 4.8 mEq/L (ref 3.5–5.1)
Sodium: 145 (ref 137–147)

## 2021-10-08 LAB — MAGNESIUM
Magnesium: 0.5
Magnesium: 0.9
PTH, 1-84 Bio-Intact: 86.64

## 2021-10-08 LAB — COMPREHENSIVE METABOLIC PANEL: Calcium: 8.3 — AB (ref 8.7–10.7)

## 2021-10-08 NOTE — Progress Notes (Signed)
Location:    [facility] Room Number: 326 Place of Service:  SNF [phone]) Provider:  Royal Hawthorn, NP  [name], MD  Patient Care Team: [name], MD as PCP - General (Internal Medicine) [name], [name], MD as PCP - Cardiology (Cardiology) [name], [name], MD as Consulting Physician (Cardiology) [name], MD as Attending Physician (Gastroenterology) [name], MD (Inactive) as Consulting Physician (Neurology) Community, [facility] ([place]) Royal Hawthorn, NP as Nurse Practitioner (Nurse Practitioner)  Extended Emergency Contact Information Primary Emergency Contact: [name] Address: [address] [place]          [place], [place] [address] [name] of [place] Phone: [phone] Mobile Phone: [phone] Relation: Spouse Secondary Emergency Contact: [name] Address: [address]          [place], [place] [address] [name] of [place] Phone: [phone] Relation: Daughter  Code Status:   Goals of care: Advanced Directive information    10/02/2021    9:46 AM  Advanced Directives  Does Patient Have a Medical Advance Directive? Yes  Type of Paramedic of [place];Living will;Out of facility DNR (pink MOST or yellow form)  Does patient want to make changes to medical advance directive? No - Patient declined  Copy of [place] in Chart? Yes - validated most recent copy scanned in chart (See row information)  Pre-existing out of facility DNR order (yellow form or pink MOST form) Yellow form placed in chart (order not valid for inpatient use)     Chief Complaint  Patient presents with   Acute Visit    Fall with hand swelling.     HPI:  Pt is a 88 y.o. male seen today for an acute visit for a fall with hand swelling.   Reports indicate that he was looking at the ducks and fell out of his WC on 6/30. He  developed two skin tears (small) to the right forearm. He fell again on 7/1 and was found on his right side in the bathroom. He attempted to transfer himself without help.   He falls frequently due to dementia, PD, and poor judgement. He has home care for monitoring and support and lives in skilled care.   He has had low Mg, low Vit D, and low Ca. Unclear etiology. Has been in declining health. On torsemide which was discontinued. Does have a hx of Crohn's but no recent exacerbation   Mg 0.9>0.7>0.5>  PTH 86.4  Ca 6.6 >6>5.8 Lab Results  Component Value Date   VD25OH 27.2 10/03/2021        Past Medical History:  Diagnosis Date   Cellulitis    Chronic diastolic CHF (congestive heart failure) ([place])    a. EF initially 35-40% after MI 1/09; b. echo 7/08: EF 60%;  c. 11/2014 Echo: EF 65-70%, Gr 1 DD, mild MR.   Coronary artery disease    a. s/p anterior STEMI 04/2007 with BMS-> LAD;  b. Cath 12/2011 patent stent;  c. low risk nuc 04/2014.   Crohn's disease ([place])    Diaphragmatic hernia without mention of obstruction or gangrene    Diverticulosis of colon (without mention of hemorrhage)    Fatty liver    a. on ultrasound of 10/2009   Flatulence, eructation, and gas pain    Fungal infection    Gait disorder    GERD (gastroesophageal reflux disease)    HOH (hard of hearing)  Hearing aids   Hyperlipidemia    Hypertension    Ischemic cardiomyopathy    a. EF initially 35-40% after MI 1/09; b. echo 7/08: EF 60%;  c. 11/2014 Echo: EF 65-70%, Gr 1 DD, mild MR.   Melanoma ([place])    Memory disorder    Orthostatic hypotension    Osteoporosis    Other chronic nonalcoholic liver disease    Other esophagitis    Parkinson's disease ([place])    RBBB 09/02/2013   Regional enteritis of small intestine ([place])    Renal calculi    Restless leg syndrome 03/22/2015   Ulcerative (chronic) ileocolitis ([facility])    Urinary incontinence    Past Surgical History:  Procedure Laterality Date   APPENDECTOMY      BACK SURGERY     L3, L4, L5   CARDIAC CATHETERIZATION  09/25/06   EF 35-40% but more recently 60%   CATARACT EXTRACTION     Bilateral   CHOLECYSTECTOMY     Coronary artery stent placement     Melanoma resection     Partial bowel resection     TONSILLECTOMY      No Known Allergies  Allergies as of 10/08/2021   No Known Allergies      Medication List        Accurate as of October 08, 2021 12:10 PM. If you have any questions, ask your nurse or doctor.          STOP taking these medications    torsemide 20 MG tablet Commonly known as: DEMADEX Stopped by: Royal Hawthorn, NP       TAKE these medications    acetaminophen 325 MG tablet Commonly known as: TYLENOL Take 650 mg by mouth every 6 (six) hours as needed for mild pain.   Align 4 MG Caps Take 1 capsule by mouth daily.   aspirin EC 81 MG tablet Take 81 mg by mouth daily.   atorvastatin 40 MG tablet Commonly known as: LIPITOR Take 40 mg by mouth daily.   calcium carbonate 600 MG Tabs tablet Commonly known as: OS-CAL Take 600 mg by mouth 3 (three) times daily.   carbidopa-levodopa 25-100 MG tablet Commonly known as: SINEMET IR Take 1 tablet by mouth 4 (four) times daily.   carbidopa-levodopa 50-200 MG tablet Commonly known as: SINEMET CR Take 1 tablet by mouth 4 (four) times daily.   cetirizine 10 MG tablet Commonly known as: ZYRTEC Take 1 tablet (10 mg total) by mouth at bedtime.   cholestyramine 4 g packet Commonly known as: QUESTRAN TAKE 1 PACKET BY MOUTH TWICE DAILY WITH A MEAL   diclofenac Sodium 1 % Gel Commonly known as: VOLTAREN Apply topically 3 (three) times daily as needed (Apply to the affected right ankle).   donepezil 10 MG tablet Commonly known as: ARICEPT TAKE ONE TABLET AT BEDTIME   Ensure Take 237 mLs by mouth 2 (two) times daily as needed.   ipratropium 0.03 % nasal spray Commonly known as: ATROVENT Place 2 sprays into both nostrils 3 (three) times daily.    levalbuterol 45 MCG/ACT inhaler Commonly known as: Xopenex HFA Inhale 5 puffs into the lungs every 6 (six) hours as needed for wheezing.   lidocaine 2 % solution Commonly known as: XYLOCAINE Use as directed 15 mLs in the mouth or throat as needed for mouth pain.   LORazepam 0.5 MG tablet Commonly known as: ATIVAN Take 0.5 mg by mouth 2 (two) times daily as needed.   Magnesium 400 MG Tabs  Take 1 tablet by mouth in the morning, at noon, and at bedtime.   midodrine 5 MG tablet Commonly known as: PROAMATINE Take 1 tablet (5 mg total) by mouth 2 (two) times daily with a meal.   multivitamin-iron-minerals-folic acid chewable tablet Chew 1 tablet by mouth daily.   nitroGLYCERIN 0.4 MG SL tablet Commonly known as: NITROSTAT Place 1 tablet (0.4 mg total) under the tongue every 5 (five) minutes as needed for chest pain. PT OVERDUE FOR OV PLEASE CALL FOR APPT   OXYGEN Inhale 2 L into the lungs.   pantoprazole 40 MG tablet Commonly known as: PROTONIX Take 40 mg by mouth daily.   PARoxetine 20 MG tablet Commonly known as: PAXIL Take 20 mg by mouth 2 (two) times daily.   potassium chloride SA 20 MEQ tablet Commonly known as: KLOR-CON M Take 40 mEq by mouth daily.   sodium chloride 0.65 % Soln nasal spray Commonly known as: OCEAN Place 2 sprays into both nostrils daily as needed for congestion.   vitamin B-12 1000 MCG tablet Commonly known as: CYANOCOBALAMIN Take 1,000 mcg by mouth daily.   Vitamin D3 50 MCG (2000 UT) Tabs Take 1 tablet by mouth daily.        Review of Systems  Constitutional:  Negative for activity change, appetite change, chills, diaphoresis, fatigue, fever and unexpected weight change.  HENT:  Negative for congestion.   Eyes:  Negative for visual disturbance.  Respiratory:  Negative for cough, shortness of breath, wheezing and stridor.   Cardiovascular:  Positive for leg swelling. Negative for chest pain and palpitations.  Gastrointestinal:   Negative for abdominal distention, abdominal pain, constipation and diarrhea.  Genitourinary:  Negative for difficulty urinating and dysuria.  Musculoskeletal:  Positive for gait problem and joint swelling. Negative for arthralgias, back pain and myalgias.  Neurological:  Positive for weakness. Negative for dizziness, seizures, syncope, facial asymmetry, speech difficulty and headaches.  Hematological:  Negative for adenopathy. Does not bruise/bleed easily.  Psychiatric/Behavioral:  Positive for agitation, behavioral problems and confusion.     Immunization History  Administered Date(s) Administered   Influenza, High Dose Seasonal PF 01/21/2019, 01/10/2021   Influenza-Unspecified 12/06/2014, 11/27/2017, 02/01/2020   Moderna Covid-19 Vaccine Bivalent Booster 74yr & up 01/17/2021   Moderna SARS-COV2 Booster Vaccination 11/28/2020   Moderna Sars-Covid-2 Vaccination 04/13/2019, 05/11/2019, 02/18/2020   Pneumococcal Conjugate-13 05/21/2018   Pneumococcal Polysaccharide-23 05/21/2005   Tdap 06/03/2006, 10/22/2018   Zoster Recombinat (Shingrix) 10/22/2018, 12/24/2018   Pertinent  Health Maintenance Due  Topic Date Due   INFLUENZA VACCINE  11/06/2021      05/12/2018    3:21 PM 10/22/2018    2:23 PM 07/20/2019   10:06 AM 11/11/2019   10:53 AM 12/07/2020    1:38 PM  Fall Risk  Falls in the past year? 1 1  1 1   Was there an injury with Fall? 0 1  1 1   Fall Risk Category Calculator 2 3  2 3   Fall Risk Category Moderate High  Moderate High  Patient Fall Risk Level Moderate fall risk   High fall risk High fall risk  Patient at Risk for Falls Due to Impaired mobility;History of fall(s);Impaired balance/gait;Mental status change;Medication side effect History of fall(s);Impaired mobility History of fall(s);Impaired balance/gait;Impaired mobility;Mental status change;Medication side effect History of fall(s);Impaired balance/gait Impaired balance/gait;History of fall(s)  Fall risk Follow up Falls  evaluation completed;Falls prevention discussed;Education provided Falls evaluation completed;Falls prevention discussed Education provided;Falls prevention discussed;Falls evaluation completed Falls evaluation completed Falls evaluation completed;Follow  up appointment  Fall risk Follow up - Comments   at [facility]:    Vitals:   10/08/21 1138  BP: 109/64  Pulse: 90  Resp: 18  Temp: 97.7 F (36.5 C)  SpO2: 98%  Weight: 159 lb (72.1 kg)  Height: 5' 9.8" (1.773 m)   Body mass index is 22.95 kg/m. Physical Exam Vitals and nursing note reviewed.  Constitutional:      General: He is not in acute distress.    Appearance: He is not diaphoretic.  HENT:     Head:     Comments: Left forehead with abrasion and small hematoma     Mouth/Throat:     Mouth: Mucous membranes are moist.     Pharynx: Oropharynx is clear.  Eyes:     Conjunctiva/sclera: Conjunctivae normal.     Pupils: Pupils are equal, round, and reactive to light.  Neck:     Thyroid: No thyromegaly.     Vascular: No JVD.     Trachea: No tracheal deviation.  Cardiovascular:     Rate and Rhythm: Normal rate and regular rhythm.     Heart sounds: No murmur heard. Pulmonary:     Effort: Pulmonary effort is normal. No respiratory distress.     Breath sounds: Normal breath sounds. No wheezing.  Abdominal:     General: Bowel sounds are normal. There is no distension.     Palpations: Abdomen is soft.     Tenderness: There is no abdominal tenderness.  Musculoskeletal:        General: Swelling (both hands.) present. No tenderness or deformity.     Right lower leg: Edema present.     Left lower leg: Edema present.     Comments: Rotated both hips, shoulders, knees with no pain or tenderness   Lymphadenopathy:     Cervical: No cervical adenopathy.  Skin:    General: Skin is warm and dry.  Neurological:     General: No focal deficit present.     Mental Status: He is alert. Mental status is at  baseline.     Cranial Nerves: No cranial nerve deficit.     Labs reviewed: Recent Labs    10/01/21 0000 10/02/21 0000 10/03/21 0000 10/05/21 0000  NA 139  --  142 143  K 3.7  --  4.0 3.8  CL 100  --  100 101  CO2 27*  --  26* 26*  BUN 15  --  13 13  CREATININE 1.1  --  1.0 0.9  CALCIUM 5.8*  --  6.0* 6.6*  MG  --  0.5 0.7 0.9   Recent Labs    06/04/21 0000 10/01/21 0000 10/05/21 0000  AST 26 27 24   ALT 12 9* 6*  ALKPHOS 71 92 99  ALBUMIN 4.4 3.9 4.2   Recent Labs    06/04/21 0000 07/27/21 0000 08/03/21 0000  WBC 7.4 6.5 10.5  NEUTROABS 4.40  --   --   HGB 12.9* 13.3* 13.1*  HCT 38* 39* 39*  PLT 245 191 260   Lab Results  Component Value Date   TSH 3.60 10/16/2020   No results found for: "HGBA1C" Lab Results  Component Value Date   CHOL 119 05/10/2021   HDL 56 05/10/2021   LDLCALC 48 05/10/2021   TRIG 76 05/10/2021    Significant Diagnostic Results in last 30 days:  No results found.  Assessment/Plan  1. Fall, initial encounter Due to weakness and  poor judgement Now on ativan 1 mg bid per psych due to behaviors. Continues to fall. Which has been an issue prior to this med. I spoke with the DON and she will speak with Dr. Casimiro Needle to see if a dose decrease would be needed. Also recommend increased home care due to falls  2. Swelling of both hands Very mild, likely due to stopping torsemide. No acute injury noted or pain to either hand.   3. Hematoma To left forehead No focal deficits or abnormal vitals. Continue to monitor.   4. Hypomagnesemia Improving Continue Mg 400 tid repeat lab pending.   5. Vitamin D deficiency Continue vit d 2000 units daily  6. Hypocalcemia Unclear etiology ?diuretics vs hyperparathyroidism. He does have a mildly elevated PTH. Due to his age and goals of care would not pursue this.  Continue Ca supplements Repeat BMP pending.    Family/ staff Communication: nurse  Labs/tests ordered:   BMP Mg pending.

## 2021-10-10 DIAGNOSIS — R293 Abnormal posture: Secondary | ICD-10-CM | POA: Diagnosis not present

## 2021-10-10 DIAGNOSIS — G20 Parkinson's disease: Secondary | ICD-10-CM | POA: Diagnosis not present

## 2021-10-10 DIAGNOSIS — R296 Repeated falls: Secondary | ICD-10-CM | POA: Diagnosis not present

## 2021-10-10 DIAGNOSIS — F428 Other obsessive-compulsive disorder: Secondary | ICD-10-CM | POA: Diagnosis not present

## 2021-10-10 DIAGNOSIS — M6389 Disorders of muscle in diseases classified elsewhere, multiple sites: Secondary | ICD-10-CM | POA: Diagnosis not present

## 2021-10-10 DIAGNOSIS — R41842 Visuospatial deficit: Secondary | ICD-10-CM | POA: Diagnosis not present

## 2021-10-10 DIAGNOSIS — R2689 Other abnormalities of gait and mobility: Secondary | ICD-10-CM | POA: Diagnosis not present

## 2021-10-12 DIAGNOSIS — G20 Parkinson's disease: Secondary | ICD-10-CM | POA: Diagnosis not present

## 2021-10-12 DIAGNOSIS — R2689 Other abnormalities of gait and mobility: Secondary | ICD-10-CM | POA: Diagnosis not present

## 2021-10-12 DIAGNOSIS — R296 Repeated falls: Secondary | ICD-10-CM | POA: Diagnosis not present

## 2021-10-12 DIAGNOSIS — F428 Other obsessive-compulsive disorder: Secondary | ICD-10-CM | POA: Diagnosis not present

## 2021-10-12 DIAGNOSIS — R41842 Visuospatial deficit: Secondary | ICD-10-CM | POA: Diagnosis not present

## 2021-10-12 DIAGNOSIS — R293 Abnormal posture: Secondary | ICD-10-CM | POA: Diagnosis not present

## 2021-10-12 DIAGNOSIS — M6389 Disorders of muscle in diseases classified elsewhere, multiple sites: Secondary | ICD-10-CM | POA: Diagnosis not present

## 2021-10-15 ENCOUNTER — Encounter: Payer: Self-pay | Admitting: Adult Health

## 2021-10-15 ENCOUNTER — Non-Acute Institutional Stay (SKILLED_NURSING_FACILITY): Payer: PPO | Admitting: Adult Health

## 2021-10-15 DIAGNOSIS — R634 Abnormal weight loss: Secondary | ICD-10-CM | POA: Diagnosis not present

## 2021-10-15 DIAGNOSIS — G20 Parkinson's disease: Secondary | ICD-10-CM

## 2021-10-15 DIAGNOSIS — F02818 Dementia in other diseases classified elsewhere, unspecified severity, with other behavioral disturbance: Secondary | ICD-10-CM

## 2021-10-15 DIAGNOSIS — R410 Disorientation, unspecified: Secondary | ICD-10-CM | POA: Diagnosis not present

## 2021-10-15 DIAGNOSIS — R293 Abnormal posture: Secondary | ICD-10-CM | POA: Diagnosis not present

## 2021-10-15 DIAGNOSIS — R2689 Other abnormalities of gait and mobility: Secondary | ICD-10-CM | POA: Diagnosis not present

## 2021-10-15 DIAGNOSIS — M25551 Pain in right hip: Secondary | ICD-10-CM

## 2021-10-15 DIAGNOSIS — F428 Other obsessive-compulsive disorder: Secondary | ICD-10-CM | POA: Diagnosis not present

## 2021-10-15 DIAGNOSIS — R41842 Visuospatial deficit: Secondary | ICD-10-CM | POA: Diagnosis not present

## 2021-10-15 DIAGNOSIS — M6389 Disorders of muscle in diseases classified elsewhere, multiple sites: Secondary | ICD-10-CM | POA: Diagnosis not present

## 2021-10-15 DIAGNOSIS — R627 Adult failure to thrive: Secondary | ICD-10-CM | POA: Diagnosis not present

## 2021-10-15 DIAGNOSIS — R296 Repeated falls: Secondary | ICD-10-CM | POA: Diagnosis not present

## 2021-10-15 MED ORDER — TRAMADOL HCL 50 MG PO TABS: 50.0000 mg | ORAL_TABLET | Freq: Two times a day (BID) | ORAL | 0 refills | Status: DC | PRN

## 2021-10-15 MED ORDER — IBUPROFEN 200 MG PO TABS: 400.0000 mg | ORAL_TABLET | Freq: Three times a day (TID) | ORAL | 0 refills | Status: AC | PRN

## 2021-10-15 NOTE — Progress Notes (Signed)
Location:  Occupational psychologist of Service:  SNF (31) Provider:   Cindi Carbon, [place] [phone]   [name], MD  Patient Care Team: [name], MD as PCP - General (Internal Medicine) [name], [name], MD as PCP - Cardiology (Cardiology) [name], [name], MD as Consulting Physician (Cardiology) [name], MD as Attending Physician (Gastroenterology) [name], MD (Inactive) as Consulting Physician (Neurology) Community, [facility] ([place]) Royal Hawthorn, NP as Nurse Practitioner (Nurse Practitioner)  Extended Emergency Contact Information Primary Emergency Contact: [name] Address: [address] [place]          [place], [place] [address] [name] of [place] Phone: [phone] Mobile Phone: [phone] Relation: Spouse Secondary Emergency Contact: [name] Address: [address]          [place], [place] [address] [name] of [place] Mobile Phone: [phone] Relation: Daughter  Code Status:  DNR Goals of care: Advanced Directive information    10/02/2021    9:46 AM  Advanced Directives  Does Patient Have a Medical Advance Directive? Yes  Type of Paramedic of [place];Living will;Out of facility DNR (pink MOST or yellow form)  Does patient want to make changes to medical advance directive? No - Patient declined  Copy of [place] in Chart? Yes - validated most recent copy scanned in chart (See row information)  Pre-existing out of facility DNR order (yellow form or pink MOST form) Yellow form placed in chart (order not valid for inpatient use)     Chief Complaint  Patient presents with   Acute Visit    Right hip pain and lethargy     HPI:  Pt is a 88 y.o. male seen today for an acute visit for right hip pain and lethargy  He has had several falls due to weakness and attempts to get up  without help. On 10/13/21 oncall provider for [facility] was notified of right hip pain. An xray was obtained which showed no acute fracture. He has been using tylenol for pain and describes it as a 4/10 but has grimacing and appears to be in quite a bit of pain. He is using a hoyer lift for transfers so he is not bearing weight. He also has some bruising around his left eye from a prior fall with small forehead hematoma which resolved.   Also he the nurse reports he has a low appetite, is weak and lethargic at times. Had some facial drooping on the right which resolved.   He has had issues with imbalance of Mg, Ca, and low Vit D. He is on supplementation. Torsemide was discontinued on 7/3. Ativan was reduced from 1 mg to 0.5 mg due to lethargy as well. Ativan is being given for some anxiety, agitation, and OCD. Goals of care are comfort based as he has progressing dementia and PD.   He has been dealing with left hand weakness and associated functional losses.  04/14 CT spine showed severe stenosis at C2-C3 and suspected degenerative spinal cord mass effect. CT of the head showed no acute intracranial abnormality and small remote left frontal lacunar infarct.    10/08/21 Mg 1.2 0.9>0.7>0.5>  PTH 86.4  10/08/21: Ca 8.3 > 6.6 >6>5.8 Past Medical History:  Diagnosis Date   Cellulitis    Chronic diastolic CHF (congestive heart failure) ([place])    a. EF initially 35-40% after MI 1/09; b. echo 7/08: EF 60%;  c. 11/2014 Echo: EF 65-70%, Gr 1 DD, mild MR.   Coronary artery disease    a. s/p anterior STEMI 04/2007 with BMS-> LAD;  b. Cath 12/2011 patent stent;  c. low risk nuc 04/2014.   Crohn's disease ([place])    Diaphragmatic hernia without mention of obstruction or gangrene    Diverticulosis of colon (without mention of hemorrhage)    Fatty liver    a. on ultrasound of 10/2009   Flatulence, eructation, and gas pain    Fungal infection    Gait disorder    GERD (gastroesophageal reflux disease)    HOH (hard of  hearing)    Hearing aids   Hyperlipidemia    Hypertension    Ischemic cardiomyopathy    a. EF initially 35-40% after MI 1/09; b. echo 7/08: EF 60%;  c. 11/2014 Echo: EF 65-70%, Gr 1 DD, mild MR.   Melanoma ([place])    Memory disorder    Orthostatic hypotension    Osteoporosis    Other chronic nonalcoholic liver disease    Other esophagitis    Parkinson's disease ([place])    RBBB 09/02/2013   Regional enteritis of small intestine (HCC)    Renal calculi    Restless leg syndrome 03/22/2015   Ulcerative (chronic) ileocolitis (HCC)    Urinary incontinence    Past Surgical History:  Procedure Laterality Date   APPENDECTOMY     BACK SURGERY     L3, L4, L5   CARDIAC CATHETERIZATION  09/25/06   EF 35-40% but more recently 60%   CATARACT EXTRACTION     Bilateral   CHOLECYSTECTOMY     Coronary artery stent placement     Melanoma resection     Partial bowel resection     TONSILLECTOMY      No Known Allergies  Outpatient Encounter Medications as of 10/15/2021  Medication Sig   acetaminophen (TYLENOL) 325 MG tablet Take 650 mg by mouth every 6 (six) hours as needed for mild pain.   aspirin EC 81 MG tablet Take 81 mg by mouth daily.   atorvastatin (LIPITOR) 40 MG tablet Take 40 mg by mouth daily.   calcium carbonate (OS-CAL) 600 MG TABS tablet Take 600 mg by mouth 3 (three) times daily.   carbidopa-levodopa (SINEMET CR) 50-200 MG tablet Take 1 tablet by mouth 4 (four) times daily.   carbidopa-levodopa (SINEMET IR) 25-100 MG tablet Take 1 tablet by mouth 4 (four) times daily.   cetirizine (ZYRTEC) 10 MG tablet Take 1 tablet (10 mg total) by mouth at bedtime.   Cholecalciferol (VITAMIN D3) 50 MCG (2000 UT) TABS Take 1 tablet by mouth daily.   cholestyramine (QUESTRAN) 4 g packet TAKE 1 PACKET BY MOUTH TWICE DAILY WITH A MEAL   diclofenac Sodium (VOLTAREN) 1 % GEL Apply topically 3 (three) times daily as needed (Apply to the affected right ankle).   donepezil (ARICEPT) 10 MG tablet TAKE ONE  TABLET AT BEDTIME   Ensure (ENSURE) Take 237 mLs by mouth 2 (two) times daily as needed.   ipratropium (ATROVENT) 0.03 % nasal spray Place 2 sprays into both nostrils 3 (three) times daily.   levalbuterol (XOPENEX HFA) 45 MCG/ACT inhaler Inhale 5 puffs into the lungs every 6 (six) hours as needed for wheezing.   lidocaine (XYLOCAINE) 2 % solution Use as directed 15 mLs in the mouth or throat as needed for mouth pain.   LORazepam (ATIVAN) 0.5 MG tablet Take 0.5 mg by mouth in the morning and at bedtime.  Magnesium 400 MG TABS Take 1 tablet by mouth in the morning, at noon, and at bedtime.   midodrine (PROAMATINE) 5 MG tablet Take 1 tablet (5 mg total) by mouth 2 (two) times daily with a meal.   multivitamin-iron-minerals-folic acid (CENTRUM) chewable tablet Chew 1 tablet by mouth daily.   nitroGLYCERIN (NITROSTAT) 0.4 MG SL tablet Place 1 tablet (0.4 mg total) under the tongue every 5 (five) minutes as needed for chest pain. PT OVERDUE FOR OV PLEASE CALL FOR APPT   OXYGEN Inhale 2 L into the lungs.   pantoprazole (PROTONIX) 40 MG tablet Take 40 mg by mouth daily.   PARoxetine (PAXIL) 20 MG tablet Take 20 mg by mouth 2 (two) times daily.   potassium chloride SA (K-DUR,KLOR-CON) 20 MEQ tablet Take 40 mEq by mouth daily.   Probiotic Product (ALIGN) 4 MG CAPS Take 1 capsule by mouth daily.    sodium chloride (OCEAN) 0.65 % SOLN nasal spray Place 2 sprays into both nostrils daily as needed for congestion.    vitamin B-12 (CYANOCOBALAMIN) 1000 MCG tablet Take 1,000 mcg by mouth daily.   No facility-administered encounter medications on file as of 10/15/2021.    Review of Systems  Constitutional:  Positive for activity change, appetite change and fatigue. Negative for chills, diaphoresis, fever and unexpected weight change.  Respiratory:  Negative for cough, shortness of breath, wheezing and stridor.   Cardiovascular:  Negative for chest pain, palpitations and leg swelling.  Gastrointestinal:   Negative for abdominal distention, abdominal pain, constipation and diarrhea.  Genitourinary:  Negative for difficulty urinating and dysuria.  Musculoskeletal:  Positive for arthralgias and gait problem. Negative for back pain, joint swelling and myalgias.       Right hip pain  Neurological:  Positive for weakness. Negative for dizziness, seizures, syncope, facial asymmetry, speech difficulty and headaches.  Hematological:  Negative for adenopathy. Does not bruise/bleed easily.  Psychiatric/Behavioral:  Positive for behavioral problems and confusion.     Immunization History  Administered Date(s) Administered   Influenza, High Dose Seasonal PF 01/21/2019, 01/10/2021   Influenza-Unspecified 12/06/2014, 11/27/2017, 02/01/2020   Moderna Covid-19 Vaccine Bivalent Booster 14yr & up 01/17/2021   Moderna SARS-COV2 Booster Vaccination 11/28/2020   Moderna Sars-Covid-2 Vaccination 04/13/2019, 05/11/2019, 02/18/2020   Pneumococcal Conjugate-13 05/21/2018   Pneumococcal Polysaccharide-23 05/21/2005   Tdap 06/03/2006, 10/22/2018   Zoster Recombinat (Shingrix) 10/22/2018, 12/24/2018   Pertinent  Health Maintenance Due  Topic Date Due   INFLUENZA VACCINE  11/06/2021      05/12/2018    3:21 PM 10/22/2018    2:23 PM 07/20/2019   10:06 AM 11/11/2019   10:53 AM 12/07/2020    1:38 PM  Fall Risk  Falls in the past year? 1 1  1 1   Was there an injury with Fall? 0 1  1 1   Fall Risk Category Calculator 2 3  2 3   Fall Risk Category Moderate High  Moderate High  Patient Fall Risk Level Moderate fall risk   High fall risk High fall risk  Patient at Risk for Falls Due to Impaired mobility;History of fall(s);Impaired balance/gait;Mental status change;Medication side effect History of fall(s);Impaired mobility History of fall(s);Impaired balance/gait;Impaired mobility;Mental status change;Medication side effect History of fall(s);Impaired balance/gait Impaired balance/gait;History of fall(s)  Fall risk Follow up  Falls evaluation completed;Falls prevention discussed;Education provided Falls evaluation completed;Falls prevention discussed Education provided;Falls prevention discussed;Falls evaluation completed Falls evaluation completed Falls evaluation completed;Follow up appointment  Fall risk Follow up - Comments   at [facility]  Functional Status Survey:    Vitals:   10/15/21 1122  BP: (!) 144/90  Pulse: 96  Resp: (!) 24  Temp: 97.8 F (36.6 C)  SpO2: 97%  Weight: 159 lb (72.1 kg)   Body mass index is 22.95 kg/m. Physical Exam Vitals reviewed.  Constitutional:      General: He is not in acute distress.    Appearance: He is not diaphoretic.  HENT:     Head: Normocephalic and atraumatic.     Mouth/Throat:     Mouth: Mucous membranes are moist.     Pharynx: Oropharynx is clear.  Eyes:     Conjunctiva/sclera: Conjunctivae normal.     Pupils: Pupils are equal, round, and reactive to light.  Neck:     Thyroid: No thyromegaly.     Vascular: No JVD.     Trachea: No tracheal deviation.  Cardiovascular:     Rate and Rhythm: Normal rate and regular rhythm.     Heart sounds: No murmur heard. Pulmonary:     Effort: Pulmonary effort is normal. No respiratory distress.     Breath sounds: Normal breath sounds. No wheezing.  Abdominal:     General: Bowel sounds are normal. There is no distension.     Palpations: Abdomen is soft.     Tenderness: There is no abdominal tenderness.  Musculoskeletal:     Cervical back: No rigidity or tenderness.     Comments: Right hip mild tenderness on palpation NO leg length discrepancy or deformity.    Lymphadenopathy:     Cervical: No cervical adenopathy.  Skin:    General: Skin is warm and dry.     Findings: Bruising (right hip and left orbit) present.  Neurological:     General: No focal deficit present.     Mental Status: He is alert.     Cranial Nerves: No cranial nerve deficit.     Comments: Alert, oriented to self and place. Forgetful of  the details of his care. Generally weak. Has left hand weakness and contracture.      Labs reviewed: Recent Labs    10/01/21 0000 10/02/21 0000 10/03/21 0000 10/05/21 0000  NA 139  --  142 143  K 3.7  --  4.0 3.8  CL 100  --  100 101  CO2 27*  --  26* 26*  BUN 15  --  13 13  CREATININE 1.1  --  1.0 0.9  CALCIUM 5.8*  --  6.0* 6.6*  MG  --  0.5 0.7 0.9   Recent Labs    06/04/21 0000 10/01/21 0000 10/05/21 0000  AST 26 27 24   ALT 12 9* 6*  ALKPHOS 71 92 99  ALBUMIN 4.4 3.9 4.2   Recent Labs    06/04/21 0000 07/27/21 0000 08/03/21 0000  WBC 7.4 6.5 10.5  NEUTROABS 4.40  --   --   HGB 12.9* 13.3* 13.1*  HCT 38* 39* 39*  PLT 245 191 260   Lab Results  Component Value Date   TSH 3.60 10/16/2020   No results found for: "HGBA1C" Lab Results  Component Value Date   CHOL 119 05/10/2021   HDL 56 05/10/2021   LDLCALC 48 05/10/2021   TRIG 76 05/10/2021    Significant Diagnostic Results in last 30 days:  No results found.  Assessment/Plan  1. Right hip pain Due to his goals of care will not pursue CT Keep comfortable and provide pain relief.  - traMADol (ULTRAM) 50 MG tablet; Take  1 tablet (50 mg total) by mouth every 12 (twelve) hours as needed for up to 15 days.  Dispense: 30 tablet; Refill: 0 - ibuprofen (ADVIL) 200 MG tablet; Take 2 tablets (400 mg total) by mouth 3 (three) times daily as needed for up to 5 days.  Dispense: 30 tablet; Refill: 0  2. Failure to thrive  With associated weight loss, multiple co morbidities, and progressive dementia, falls etc Recommend hospice referral. Discussed with his daughter Webb Silversmith. We agree that his QOL is declining and that he would benefit from hospice services.   3. Confusion Slight worsening with some lethargy ?if this is due to his fall with small scalp hematoma. No focal deficit at this time. See above   4. Weight loss Due to progression of dementia, weakness, decreased appetite.   5. Dementia due to  Parkinson's disease with behavioral disturbance ([place]) Progressive decline in cognition and physical function c/w the disease. Continue supportive care in the skilled environment.   6. Hypomagnesemia improving ? If this was due to diuretics.  Reduce Mg to once daily (may lead to GI upset)  7. Hypocalcemia improving Reduce ca supplementation to once daily     Family/ staff Communication: discussed with daughter Sinclair Grooms ordered:   previously ordered

## 2021-10-16 DIAGNOSIS — F428 Other obsessive-compulsive disorder: Secondary | ICD-10-CM | POA: Diagnosis not present

## 2021-10-16 DIAGNOSIS — R293 Abnormal posture: Secondary | ICD-10-CM | POA: Diagnosis not present

## 2021-10-16 DIAGNOSIS — R41842 Visuospatial deficit: Secondary | ICD-10-CM | POA: Diagnosis not present

## 2021-10-16 DIAGNOSIS — G20 Parkinson's disease: Secondary | ICD-10-CM | POA: Diagnosis not present

## 2021-10-16 DIAGNOSIS — M25551 Pain in right hip: Secondary | ICD-10-CM | POA: Diagnosis not present

## 2021-10-16 DIAGNOSIS — R296 Repeated falls: Secondary | ICD-10-CM | POA: Diagnosis not present

## 2021-10-16 DIAGNOSIS — M6389 Disorders of muscle in diseases classified elsewhere, multiple sites: Secondary | ICD-10-CM | POA: Diagnosis not present

## 2021-10-16 DIAGNOSIS — R2689 Other abnormalities of gait and mobility: Secondary | ICD-10-CM | POA: Diagnosis not present

## 2021-10-17 DIAGNOSIS — M6389 Disorders of muscle in diseases classified elsewhere, multiple sites: Secondary | ICD-10-CM | POA: Diagnosis not present

## 2021-10-17 DIAGNOSIS — R41842 Visuospatial deficit: Secondary | ICD-10-CM | POA: Diagnosis not present

## 2021-10-17 DIAGNOSIS — G20 Parkinson's disease: Secondary | ICD-10-CM | POA: Diagnosis not present

## 2021-10-17 DIAGNOSIS — R296 Repeated falls: Secondary | ICD-10-CM | POA: Diagnosis not present

## 2021-10-17 DIAGNOSIS — R293 Abnormal posture: Secondary | ICD-10-CM | POA: Diagnosis not present

## 2021-10-17 DIAGNOSIS — F428 Other obsessive-compulsive disorder: Secondary | ICD-10-CM | POA: Diagnosis not present

## 2021-10-17 DIAGNOSIS — R2689 Other abnormalities of gait and mobility: Secondary | ICD-10-CM | POA: Diagnosis not present

## 2021-10-18 ENCOUNTER — Telehealth: Payer: Self-pay | Admitting: *Deleted

## 2021-10-18 ENCOUNTER — Telehealth: Payer: Self-pay | Admitting: Adult Health

## 2021-10-18 DIAGNOSIS — R296 Repeated falls: Secondary | ICD-10-CM | POA: Diagnosis not present

## 2021-10-18 DIAGNOSIS — M25551 Pain in right hip: Secondary | ICD-10-CM

## 2021-10-18 DIAGNOSIS — F428 Other obsessive-compulsive disorder: Secondary | ICD-10-CM | POA: Diagnosis not present

## 2021-10-18 DIAGNOSIS — R2689 Other abnormalities of gait and mobility: Secondary | ICD-10-CM | POA: Diagnosis not present

## 2021-10-18 DIAGNOSIS — R293 Abnormal posture: Secondary | ICD-10-CM | POA: Diagnosis not present

## 2021-10-18 DIAGNOSIS — G20 Parkinson's disease: Secondary | ICD-10-CM | POA: Diagnosis not present

## 2021-10-18 DIAGNOSIS — R41842 Visuospatial deficit: Secondary | ICD-10-CM | POA: Diagnosis not present

## 2021-10-18 DIAGNOSIS — M6389 Disorders of muscle in diseases classified elsewhere, multiple sites: Secondary | ICD-10-CM | POA: Diagnosis not present

## 2021-10-18 LAB — BASIC METABOLIC PANEL
BUN: 11 (ref 4–21)
CO2: 23 — AB (ref 13–22)
Chloride: 100 (ref 99–108)
Creatinine: 0.8 (ref 0.6–1.3)
Glucose: 102
Potassium: 4.3 mEq/L (ref 3.5–5.1)
Sodium: 137 (ref 137–147)

## 2021-10-18 LAB — COMPREHENSIVE METABOLIC PANEL: Calcium: 9 (ref 8.7–10.7)

## 2021-10-18 MED ORDER — HYDROCODONE-ACETAMINOPHEN 5-325 MG PO TABS: 1.0000 | ORAL_TABLET | Freq: Four times a day (QID) | ORAL | 0 refills | Status: DC | PRN

## 2021-10-18 NOTE — Telephone Encounter (Signed)
Tramadol is not helping with right hip pain. Pt is awaiting hospice consult. Will order norco.

## 2021-10-18 NOTE — Telephone Encounter (Signed)
Received refill Request from [facility]: (medication is not in patient's current medication list. SNF Patient.) Forwarded incoming fax to Cindi Carbon, NP

## 2021-10-18 NOTE — Telephone Encounter (Signed)
This should go to the nursing supervisor because the pt lives in skilled care. I will take care of it here. Thanks.

## 2021-10-22 ENCOUNTER — Telehealth: Payer: Self-pay | Admitting: Neurology

## 2021-10-22 LAB — MAGNESIUM: Magnesium: 1.8

## 2021-10-22 NOTE — Telephone Encounter (Signed)
Noted.  This pt has appt with SS/NP on 11-01-2021. To be examined in wheelchair due to falls.  (Do not weigh).

## 2021-10-22 NOTE — Telephone Encounter (Signed)
This patient is now under the care of Dr. Rexene Alberts. He has an appt pending with her 11/01/21.  I returned the call to his facility and spoke to [place] (member of his care team).  He will be arriving to our office with a staff member. He will be in a reclining wheelchair. States the patient often tells caregivers he can stand but says he is not able to on his own. History of falls. They transfer him in a hoyer lift at the facility. She wanted to let us know ahead of time that he will need to be evaluated from his wheelchair.

## 2021-10-22 NOTE — Telephone Encounter (Signed)
Jasmine from [facility] has a question for the nurse about Pt appointment.

## 2021-10-23 DIAGNOSIS — I10 Essential (primary) hypertension: Secondary | ICD-10-CM | POA: Diagnosis not present

## 2021-10-23 LAB — CBC AND DIFFERENTIAL
HCT: 39 — AB (ref 41–53)
Hemoglobin: 12.9 — AB (ref 13.5–17.5)
Platelets: 281 10*3/uL (ref 150–400)
WBC: 9.7

## 2021-10-23 LAB — CBC: RBC: 4.22 (ref 3.87–5.11)

## 2021-10-29 ENCOUNTER — Other Ambulatory Visit: Payer: Self-pay | Admitting: Adult Health

## 2021-10-29 MED ORDER — LORAZEPAM 0.5 MG PO TABS: 0.5000 mg | ORAL_TABLET | Freq: Two times a day (BID) | ORAL | 3 refills | Status: DC

## 2021-11-01 ENCOUNTER — Ambulatory Visit: Payer: PPO | Admitting: Neurology

## 2021-11-23 DIAGNOSIS — F605 Obsessive-compulsive personality disorder: Secondary | ICD-10-CM | POA: Diagnosis not present

## 2021-11-27 ENCOUNTER — Other Ambulatory Visit: Payer: Self-pay | Admitting: Orthopedic Surgery

## 2021-11-27 DIAGNOSIS — G20 Parkinson's disease: Secondary | ICD-10-CM

## 2021-11-27 MED ORDER — LORAZEPAM 0.5 MG PO TABS: 0.5000 mg | ORAL_TABLET | Freq: Two times a day (BID) | ORAL | 3 refills | Status: AC

## 2021-12-06 ENCOUNTER — Non-Acute Institutional Stay (SKILLED_NURSING_FACILITY): Admitting: Adult Health

## 2021-12-06 DIAGNOSIS — G20 Parkinson's disease: Secondary | ICD-10-CM | POA: Diagnosis not present

## 2021-12-06 DIAGNOSIS — M25551 Pain in right hip: Secondary | ICD-10-CM | POA: Diagnosis not present

## 2021-12-06 DIAGNOSIS — M4802 Spinal stenosis, cervical region: Secondary | ICD-10-CM | POA: Diagnosis not present

## 2021-12-06 DIAGNOSIS — F02818 Dementia in other diseases classified elsewhere, unspecified severity, with other behavioral disturbance: Secondary | ICD-10-CM

## 2021-12-06 DIAGNOSIS — R627 Adult failure to thrive: Secondary | ICD-10-CM | POA: Diagnosis not present

## 2021-12-06 DIAGNOSIS — R0902 Hypoxemia: Secondary | ICD-10-CM

## 2021-12-06 DIAGNOSIS — K5000 Crohn's disease of small intestine without complications: Secondary | ICD-10-CM | POA: Diagnosis not present

## 2021-12-06 DIAGNOSIS — R634 Abnormal weight loss: Secondary | ICD-10-CM

## 2021-12-07 ENCOUNTER — Encounter: Payer: Self-pay | Admitting: Adult Health

## 2021-12-07 NOTE — Progress Notes (Signed)
Location:  [facility] of Service:  SNF (31) Provider:   Cindi Carbon, [place] [phone]   [name], MD  Patient Care Team: [name], MD as PCP - General (Internal Medicine) [name], [name], MD as PCP - Cardiology (Cardiology) [name], [name], MD as Consulting Physician (Cardiology) [name], MD as Attending Physician (Gastroenterology) [name], MD (Inactive) as Consulting Physician (Neurology) Community, [facility] ([place]) Royal Hawthorn, NP as Nurse Practitioner (Nurse Practitioner)  Extended Emergency Contact Information Primary Emergency Contact: [name] Address: [address] [place] [place]          [place], [place] [address] [name] of [place] Phone: [phone] Mobile Phone: [phone] Relation: Spouse Secondary Emergency Contact: [name] Address: [address]          [place], [place] [address] [name] of [place] Mobile Phone: [phone] Relation: Daughter  Code Status:  DNR , most, hospice  Goals of care: Advanced Directive information    10/02/2021    9:46 AM  Advanced Directives  Does Patient Have a Medical Advance Directive? Yes  Type of Paramedic of Force;Living will;Out of facility DNR (pink MOST or yellow form)  Does patient want to make changes to medical advance directive? No - Patient declined  Copy of [place] in Chart? Yes - validated most recent copy scanned in chart (See row information)  Pre-existing out of facility DNR order (yellow form or pink MOST form) Yellow form placed in chart (order not valid for inpatient use)     Chief Complaint  Patient presents with   Medical Management of Chronic Issues    HPI:  Pt is a 88 y.o. male seen today for medical management of chronic diseases.   He is followed by hospice due to FTT, weight loss, dementia, PD.   He has been declining in physical and cognitive function and now is also requiring oxygen. He is 94% on 3 liters. No cough or sob at this time. Hospice has discontinued several unnecessary meds such as vitamins and lipitor.  The nurse reports he is more confused, sleeping more, and has agitation. Hospice started seroquel for this reason with some benefit noted. The nurse also reports he is having loose stools at times. He had 3 this shift. No fever, abd pain or foul odor. Has Crohn's disease. Is on Magnesium for low Mg level. No blood in stool either.  He is having some low back and hip pain now that he is in bed more often. Staff providing norco for pain relief. Goals of care are comfort based with no hospitalizations.  In April he had a CT done due to arm weakness on the left side and numbness and tingling which showed the following 04/14 CT spine showed severe stenosis at C2-C3 and suspected degenerative spinal cord mass effect. CT of the head showed no acute intracranial abnormality and small remote left frontal lacunar infarct.   Past Medical History:  Diagnosis Date   Cellulitis    Chronic diastolic CHF (congestive heart failure) ([place])    a. EF initially 35-40% after MI 1/09; b. echo 7/08: EF 60%;  c. 11/2014 Echo: EF 65-70%, Gr 1 DD, mild MR.   Coronary artery disease    a. s/p anterior STEMI 04/2007 with BMS-> LAD;  b. Cath 12/2011 patent stent;  c. low risk nuc 04/2014.   Crohn's disease ([place])    Diaphragmatic  hernia without mention of obstruction or gangrene    Diverticulosis of colon (without mention of hemorrhage)    Fatty liver    a. on ultrasound of 10/2009   Flatulence, eructation, and gas pain    Fungal infection    Gait disorder    GERD (gastroesophageal reflux disease)    HOH (hard of hearing)    Hearing aids   Hyperlipidemia    Hypertension    Ischemic cardiomyopathy    a. EF initially 35-40% after MI 1/09; b. echo 7/08: EF 60%;  c. 11/2014 Echo: EF 65-70%, Gr 1 DD, mild MR.    Melanoma ([place])    Memory disorder    Orthostatic hypotension    Osteoporosis    Other chronic nonalcoholic liver disease    Other esophagitis    Parkinson's disease ([place])    RBBB 09/02/2013   Regional enteritis of small intestine (HCC)    Renal calculi    Restless leg syndrome 03/22/2015   Ulcerative (chronic) ileocolitis (HCC)    Urinary incontinence    Past Surgical History:  Procedure Laterality Date   APPENDECTOMY     BACK SURGERY     L3, L4, L5   CARDIAC CATHETERIZATION  09/25/06   EF 35-40% but more recently 60%   CATARACT EXTRACTION     Bilateral   CHOLECYSTECTOMY     Coronary artery stent placement     Melanoma resection     Partial bowel resection     TONSILLECTOMY      No Known Allergies  Outpatient Encounter Medications as of 12/06/2021  Medication Sig   loperamide (IMODIUM) 2 MG capsule Take 2 mg by mouth 3 (three) times daily as needed for diarrhea or loose stools.   QUEtiapine (SEROQUEL) 25 MG tablet Take 12.5 mg by mouth daily. And q 8 prn   acetaminophen (TYLENOL) 325 MG tablet Take 650 mg by mouth at bedtime. And q 6 prn   aspirin EC 81 MG tablet Take 81 mg by mouth daily.   carbidopa-levodopa (SINEMET CR) 50-200 MG tablet Take 1 tablet by mouth 4 (four) times daily.   carbidopa-levodopa (SINEMET IR) 25-100 MG tablet Take 1 tablet by mouth 4 (four) times daily.   cetirizine (ZYRTEC) 10 MG tablet Take 1 tablet (10 mg total) by mouth at bedtime.   cholestyramine (QUESTRAN) 4 g packet TAKE 1 PACKET BY MOUTH TWICE DAILY WITH A MEAL   diclofenac Sodium (VOLTAREN) 1 % GEL Apply topically 3 (three) times daily as needed (Apply to the affected right ankle).   donepezil (ARICEPT) 10 MG tablet TAKE ONE TABLET AT BEDTIME   HYDROcodone-acetaminophen (NORCO) 5-325 MG tablet Take 1 tablet by mouth every 6 (six) hours as needed for moderate pain.   ipratropium (ATROVENT) 0.03 % nasal spray Place 2 sprays into both nostrils 3 (three) times daily.   levalbuterol (XOPENEX  HFA) 45 MCG/ACT inhaler Inhale 5 puffs into the lungs every 6 (six) hours as needed for wheezing.   lidocaine (XYLOCAINE) 2 % solution Use as directed 15 mLs in the mouth or throat as needed for mouth pain.   LORazepam (ATIVAN) 0.5 MG tablet Take 1 tablet (0.5 mg total) by mouth in the morning and at bedtime.   midodrine (PROAMATINE) 5 MG tablet Take 1 tablet (5 mg total) by mouth 2 (two) times daily with a meal.   nitroGLYCERIN (NITROSTAT) 0.4 MG SL tablet Place 1 tablet (0.4 mg total) under the tongue every 5 (five) minutes as needed for chest pain.  PT OVERDUE FOR OV PLEASE CALL FOR APPT   OXYGEN Inhale 2 L into the lungs.   pantoprazole (PROTONIX) 40 MG tablet Take 40 mg by mouth daily.   PARoxetine (PAXIL) 20 MG tablet Take 20 mg by mouth 2 (two) times daily.   Probiotic Product (ALIGN) 4 MG CAPS Take 1 capsule by mouth daily.    sodium chloride (OCEAN) 0.65 % SOLN nasal spray Place 2 sprays into both nostrils daily as needed for congestion.    [DISCONTINUED] atorvastatin (LIPITOR) 40 MG tablet Take 40 mg by mouth daily.   [DISCONTINUED] calcium carbonate (OS-CAL) 600 MG TABS tablet Take 600 mg by mouth 3 (three) times daily.   [DISCONTINUED] Cholecalciferol (VITAMIN D3) 50 MCG (2000 UT) TABS Take 1 tablet by mouth daily.   [DISCONTINUED] Ensure (ENSURE) Take 237 mLs by mouth 2 (two) times daily as needed.   [DISCONTINUED] Magnesium 400 MG TABS Take 1 tablet by mouth in the morning, at noon, and at bedtime.   [DISCONTINUED] multivitamin-iron-minerals-folic acid (CENTRUM) chewable tablet Chew 1 tablet by mouth daily.   [DISCONTINUED] potassium chloride SA (K-DUR,KLOR-CON) 20 MEQ tablet Take 40 mEq by mouth daily.   [DISCONTINUED] vitamin B-12 (CYANOCOBALAMIN) 1000 MCG tablet Take 1,000 mcg by mouth daily.   No facility-administered encounter medications on file as of 12/06/2021.    Review of Systems  Unable to perform ROS: Dementia    Immunization History  Administered Date(s)  Administered   Influenza, High Dose Seasonal PF 01/21/2019, 01/10/2021   Influenza-Unspecified 12/06/2014, 11/27/2017, 02/01/2020   Moderna Covid-19 Vaccine Bivalent Booster 60yr & up 01/17/2021   Moderna SARS-COV2 Booster Vaccination 11/28/2020   Moderna Sars-Covid-2 Vaccination 04/13/2019, 05/11/2019, 02/18/2020   Pneumococcal Conjugate-13 05/21/2018   Pneumococcal Polysaccharide-23 05/21/2005   Tdap 06/03/2006, 10/22/2018   Zoster Recombinat (Shingrix) 10/22/2018, 12/24/2018   Pertinent  Health Maintenance Due  Topic Date Due   INFLUENZA VACCINE  11/06/2021      05/12/2018    3:21 PM 10/22/2018    2:23 PM 07/20/2019   10:06 AM 11/11/2019   10:53 AM 12/07/2020    1:38 PM  Fall Risk  Falls in the past year? 1 1  1 1   Was there an injury with Fall? 0 1  1 1   Fall Risk Category Calculator 2 3  2 3   Fall Risk Category Moderate High  Moderate High  Patient Fall Risk Level Moderate fall risk   High fall risk High fall risk  Patient at Risk for Falls Due to Impaired mobility;History of fall(s);Impaired balance/gait;Mental status change;Medication side effect History of fall(s);Impaired mobility History of fall(s);Impaired balance/gait;Impaired mobility;Mental status change;Medication side effect History of fall(s);Impaired balance/gait Impaired balance/gait;History of fall(s)  Fall risk Follow up Falls evaluation completed;Falls prevention discussed;Education provided Falls evaluation completed;Falls prevention discussed Education provided;Falls prevention discussed;Falls evaluation completed Falls evaluation completed Falls evaluation completed;Follow up appointment  Fall risk Follow up - Comments   [facility]:    Vitals:   12/07/21 0833  Weight: 151 lb (68.5 kg)   Body mass index is 21.79 kg/m. Physical Exam Vitals and nursing note reviewed.  Constitutional:      General: He is not in acute distress.    Appearance: He is not diaphoretic.  HENT:      Head: Normocephalic and atraumatic.     Nose: Nose normal.     Mouth/Throat:     Mouth: Mucous membranes are dry.     Pharynx: Oropharynx is clear.  Eyes:  Conjunctiva/sclera: Conjunctivae normal.     Pupils: Pupils are equal, round, and reactive to light.  Neck:     Thyroid: No thyromegaly.     Vascular: No JVD.     Trachea: No tracheal deviation.  Cardiovascular:     Rate and Rhythm: Normal rate and regular rhythm.     Heart sounds: No murmur heard. Pulmonary:     Effort: Pulmonary effort is normal. No respiratory distress.     Breath sounds: No wheezing.     Comments: Decreased bases Abdominal:     General: Bowel sounds are normal. There is no distension.     Palpations: Abdomen is soft.     Tenderness: There is no abdominal tenderness.  Lymphadenopathy:     Cervical: No cervical adenopathy.  Skin:    General: Skin is warm and dry.  Neurological:     Mental Status: He is alert.     Comments: Eyes are open. Intermittently able to follow commands and answer questions. Significant weakness to BUE with worsening rigidity and contracture.       Labs reviewed: Recent Labs    10/03/21 0000 10/05/21 0000 10/08/21 0000 10/18/21 0000  NA 142 143 145 137  K 4.0 3.8 4.8 4.3  CL 100 101 108 100  CO2 26* 26* 23* 23*  BUN 13 13 7 11   CREATININE 1.0 0.9 0.7 0.8  CALCIUM 6.0* 6.6* 8.3* 9.0  MG 0.7 0.9  --  1.8   Recent Labs    06/04/21 0000 10/01/21 0000 10/05/21 0000  AST 26 27 24   ALT 12 9* 6*  ALKPHOS 71 92 99  ALBUMIN 4.4 3.9 4.2   Recent Labs    06/04/21 0000 07/27/21 0000 08/03/21 0000 10/23/21 0000  WBC 7.4 6.5 10.5 9.7  NEUTROABS 4.40  --   --   --   HGB 12.9* 13.3* 13.1* 12.9*  HCT 38* 39* 39* 39*  PLT 245 191 260 281   Lab Results  Component Value Date   TSH 3.60 10/16/2020   No results found for: "HGBA1C" Lab Results  Component Value Date   CHOL 119 05/10/2021   HDL 56 05/10/2021   LDLCALC 48 05/10/2021   TRIG 76 05/10/2021     Significant Diagnostic Results in last 30 days:  No results found.  Assessment/Plan 1. Hypoxia ?due to atelectasis, decreased mobility, cervical issues.  No sputum production or fever Continue oxygen No further workup, maintain comfort.   2. Weight loss Due to progressive decline and decreased intake.   3. FTT (failure to thrive) in adult Weight loss, weakness, less able to participate in activities.   4. Dementia due to Parkinson's disease with behavioral disturbance ([place]) Progressive decline in cognition and physical function c/w the disease. Continue supportive care in the skilled environment. Continue seroquel for agitation Continue paxil for OCD behaviors   5. Parkinson disease ([place]) Continues on sinemet No longer ambulatory or able to operate PWC  6. Crohn's disease of small intestine without complication ([place]) Having some issues with loose stools  D/c magnesium Add imodium as needed   7. Pain of right hip And low back  Continue prn norco  8. Cervical stenosis Significant weakness to upper ext and was not a surgical candidate Continue norco as above.

## 2021-12-11 ENCOUNTER — Encounter: Payer: Self-pay | Admitting: Internal Medicine

## 2021-12-11 ENCOUNTER — Non-Acute Institutional Stay (SKILLED_NURSING_FACILITY): Admitting: Internal Medicine

## 2021-12-11 DIAGNOSIS — R627 Adult failure to thrive: Secondary | ICD-10-CM

## 2021-12-11 DIAGNOSIS — R634 Abnormal weight loss: Secondary | ICD-10-CM | POA: Diagnosis not present

## 2021-12-11 DIAGNOSIS — R0902 Hypoxemia: Secondary | ICD-10-CM

## 2021-12-11 DIAGNOSIS — H01005 Unspecified blepharitis left lower eyelid: Secondary | ICD-10-CM | POA: Diagnosis not present

## 2021-12-11 NOTE — Progress Notes (Signed)
Location:   [place] Room Number: [place] of Service:  SNF [phone]) Provider:  Salomon Fick, MD  Patient Care Team: [name], MD as PCP - General (Internal Medicine) [name], [name], MD as PCP - Cardiology (Cardiology) [name], [name], MD as Consulting Physician (Cardiology) [name], MD as Attending Physician (Gastroenterology) [name], MD (Inactive) as Consulting Physician (Neurology) Community, [facility] ([place]) Royal Hawthorn, NP as Nurse Practitioner (Nurse Practitioner)  Extended Emergency Contact Information Primary Emergency Contact: [name] Address: [address] [place] [place]          [place], [place] [address] [name] of [place] Phone: [phone] Mobile Phone: [phone] Relation: Spouse Secondary Emergency Contact: [name] Address: [address]          [place], [place] [address] [name] of [place] Phone: [phone] Relation: Daughter  Code Status:  DNR Goals of care: Advanced Directive information    12/11/2021   12:01 PM  Advanced Directives  Does Patient Have a Medical Advance Directive? Yes  Type of Paramedic of [place];Living will;Out of facility DNR (pink MOST or yellow form)  Does patient want to make changes to medical advance directive? No - Patient declined  Copy of [place] in Chart? Yes - validated most recent copy scanned in chart (See row information)  Pre-existing out of facility DNR order (yellow form or pink MOST form) Yellow form placed in chart (order not valid for inpatient use)     Chief Complaint  Patient presents with   Acute Visit    Eye infection    HPI:  Pt is a 88 y.o. male seen today for an acute visit for left Eye swelling and Red  Patient with h/o Failure to thrive, Dementia, Hypoxia and Weight loss Patient has h/o Parkinson  disease on Sinemet Follows with Neurology H/o CAD Follows with Dr [name] Chronic CHF with LE edema HTN, HLD Also h/o Crohn's Disease  Orthostatic hypotension Also has h/o OCD follows with Facility Psychiatry    [place]ovid with Severe symptoms OCD behavior with Hallucinations Left Arm weakness with Contractures CT Scan  of Brain and neck  Did not show any acute Infarct CT scan of Neck showed Advanced cervical spine degeneration,with Severe Spinal stenosis Weight loss  Enrolled in Hospice  Seen today for Left Eyelid Redness Patient denies any pain  Not able to get much history from him   Past Medical History:  Diagnosis Date   Cellulitis    Chronic diastolic CHF (congestive heart failure) ([place])    a. EF initially 35-40% after MI 1/09; b. echo 7/08: EF 60%;  c. 11/2014 Echo: EF 65-70%, Gr 1 DD, mild MR.   Coronary artery disease    a. s/p anterior STEMI 04/2007 with BMS-> LAD;  b. Cath 12/2011 patent stent;  c. low risk nuc 04/2014.   Crohn's disease ([place])    Diaphragmatic hernia without mention of obstruction or gangrene    Diverticulosis of colon (without mention of hemorrhage)    Fatty liver    a. on ultrasound of 10/2009   Flatulence, eructation, and gas pain    Fungal infection    Gait disorder    GERD (gastroesophageal reflux disease)    HOH (hard of hearing)    Hearing aids   Hyperlipidemia    Hypertension    Ischemic cardiomyopathy    a. EF initially 35-40% after MI  1/09; b. echo 7/08: EF 60%;  c. 11/2014 Echo: EF 65-70%, Gr 1 DD, mild MR.   Melanoma ([place])    Memory disorder    Orthostatic hypotension    Osteoporosis    Other chronic nonalcoholic liver disease    Other esophagitis    Parkinson's disease ([place])    RBBB 09/02/2013   Regional enteritis of small intestine ([place])    Renal calculi    Restless leg syndrome 03/22/2015   Ulcerative (chronic) ileocolitis (HCC)    Urinary incontinence    Past Surgical History:  Procedure Laterality Date   APPENDECTOMY      BACK SURGERY     L3, L4, L5   CARDIAC CATHETERIZATION  09/25/06   EF 35-40% but more recently 60%   CATARACT EXTRACTION     Bilateral   CHOLECYSTECTOMY     Coronary artery stent placement     Melanoma resection     Partial bowel resection     TONSILLECTOMY      No Known Allergies  Allergies as of 12/11/2021   No Known Allergies      Medication List        Accurate as of December 11, 2021 12:02 PM. If you have any questions, ask your nurse or doctor.          acetaminophen 325 MG tablet Commonly known as: TYLENOL Take 650 mg by mouth at bedtime. And q 6 prn   Align 4 MG Caps Take 1 capsule by mouth daily.   aspirin EC 81 MG tablet Take 81 mg by mouth daily.   carbidopa-levodopa 50-200 MG tablet Commonly known as: SINEMET CR Take 1 tablet by mouth 4 (four) times daily. What changed: Another medication with the same name was removed. Continue taking this medication, and follow the directions you see here. Changed by: [name], MD   cetirizine 10 MG tablet Commonly known as: ZYRTEC Take 1 tablet (10 mg total) by mouth at bedtime.   cholestyramine 4 g packet Commonly known as: QUESTRAN TAKE 1 PACKET BY MOUTH TWICE DAILY WITH A MEAL   diclofenac Sodium 1 % Gel Commonly known as: VOLTAREN Apply topically 3 (three) times daily as needed (Apply to the affected right ankle).   donepezil 10 MG tablet Commonly known as: ARICEPT TAKE ONE TABLET AT BEDTIME   HYDROcodone-acetaminophen 5-325 MG tablet Commonly known as: Norco Take 1 tablet by mouth every 6 (six) hours as needed for moderate pain.   ipratropium 0.03 % nasal spray Commonly known as: ATROVENT Place 2 sprays into both nostrils 3 (three) times daily.   levalbuterol 45 MCG/ACT inhaler Commonly known as: Xopenex HFA Inhale 5 puffs into the lungs every 6 (six) hours as needed for wheezing.   lidocaine 2 % solution Commonly known as: XYLOCAINE Use as directed 15 mLs in the mouth or throat as  needed for mouth pain.   loperamide 2 MG capsule Commonly known as: IMODIUM Take 2 mg by mouth 3 (three) times daily as needed for diarrhea or loose stools.   LORazepam 0.5 MG tablet Commonly known as: ATIVAN Take 1 tablet (0.5 mg total) by mouth in the morning and at bedtime.   midodrine 5 MG tablet Commonly known as: PROAMATINE Take 1 tablet (5 mg total) by mouth 2 (two) times daily with a meal.   nitroGLYCERIN 0.4 MG SL tablet Commonly known as: NITROSTAT Place 1 tablet (0.4 mg total) under the tongue every 5 (five) minutes as needed for chest pain. PT  OVERDUE FOR OV PLEASE CALL FOR APPT   OXYGEN Inhale 2 L into the lungs.   pantoprazole 40 MG tablet Commonly known as: PROTONIX Take 40 mg by mouth daily.   PARoxetine 20 MG tablet Commonly known as: PAXIL Take 20 mg by mouth 2 (two) times daily.   QUEtiapine 25 MG tablet Commonly known as: SEROQUEL Take 12.5 mg by mouth daily. And q 8 prn   sodium chloride 0.65 % Soln nasal spray Commonly known as: OCEAN Place 2 sprays into both nostrils daily as needed for congestion.        Review of Systems  Unable to perform ROS: Other    Immunization History  Administered Date(s) Administered   Influenza, High Dose Seasonal PF 01/21/2019, 01/10/2021   Influenza-Unspecified 12/06/2014, 11/27/2017, 02/01/2020   Moderna Covid-19 Vaccine Bivalent Booster 51yr & up 01/17/2021   Moderna SARS-COV2 Booster Vaccination 11/28/2020   Moderna Sars-Covid-2 Vaccination 04/13/2019, 05/11/2019, 02/18/2020   Pneumococcal Conjugate-13 05/21/2018   Pneumococcal Polysaccharide-23 05/21/2005   Tdap 06/03/2006, 10/22/2018   Zoster Recombinat (Shingrix) 10/22/2018, 12/24/2018   Pertinent  Health Maintenance Due  Topic Date Due   INFLUENZA VACCINE  11/06/2021      05/12/2018    3:21 PM 10/22/2018    2:23 PM 07/20/2019   10:06 AM 11/11/2019   10:53 AM 12/07/2020    1:38 PM  Fall Risk  Falls in the past year? 1 1  1 1   Was there an injury  with Fall? 0 1  1 1   Fall Risk Category Calculator 2 3  2 3   Fall Risk Category Moderate High  Moderate High  Patient Fall Risk Level Moderate fall risk   High fall risk High fall risk  Patient at Risk for Falls Due to Impaired mobility;History of fall(s);Impaired balance/gait;Mental status change;Medication side effect History of fall(s);Impaired mobility History of fall(s);Impaired balance/gait;Impaired mobility;Mental status change;Medication side effect History of fall(s);Impaired balance/gait Impaired balance/gait;History of fall(s)  Fall risk Follow up Falls evaluation completed;Falls prevention discussed;Education provided Falls evaluation completed;Falls prevention discussed Education provided;Falls prevention discussed;Falls evaluation completed Falls evaluation completed Falls evaluation completed;Follow up appointment  Fall risk Follow up - Comments   at [facility]    Vitals:   12/11/21 1153  BP: 137/77  Pulse: 78  Resp: 19  Temp: 97.8 F (36.6 C)  SpO2: 97%  Weight: 151 lb (68.5 kg)  Height: 5' 9.8" (1.773 m)   Body mass index is 21.79 kg/m. Physical Exam Vitals reviewed.  Constitutional:      Appearance: Normal appearance.  HENT:     Head: Normocephalic.     Nose: Nose normal.     Mouth/Throat:     Mouth: Mucous membranes are moist.     Pharynx: Oropharynx is clear.  Eyes:     Pupils: Pupils are equal, round, and reactive to light.     Comments: Left Lower Eye lid Blepharitis  Cardiovascular:     Rate and Rhythm: Normal rate and regular rhythm.     Pulses: Normal pulses.     Heart sounds: No murmur heard. Pulmonary:     Effort: Pulmonary effort is normal. No respiratory distress.     Breath sounds: Normal breath sounds. No rales.  Abdominal:     General: Abdomen is flat. Bowel sounds are normal.     Palpations: Abdomen is soft.  Musculoskeletal:        General: No swelling.     Cervical back: Neck supple.  Skin:     General: Skin is warm.  Neurological:     Mental Status: He is alert.  Psychiatric:        Mood and Affect: Mood normal.        Thought Content: Thought content normal.     Labs reviewed: Recent Labs    10/03/21 0000 10/05/21 0000 10/08/21 0000 10/18/21 0000  NA 142 143 145 137  K 4.0 3.8 4.8 4.3  CL 100 101 108 100  CO2 26* 26* 23* 23*  BUN 13 13 7 11   CREATININE 1.0 0.9 0.7 0.8  CALCIUM 6.0* 6.6* 8.3* 9.0  MG 0.7 0.9  --  1.8   Recent Labs    06/04/21 0000 10/01/21 0000 10/05/21 0000  AST 26 27 24   ALT 12 9* 6*  ALKPHOS 71 92 99  ALBUMIN 4.4 3.9 4.2   Recent Labs    06/04/21 0000 07/27/21 0000 08/03/21 0000 10/23/21 0000  WBC 7.4 6.5 10.5 9.7  NEUTROABS 4.40  --   --   --   HGB 12.9* 13.3* 13.1* 12.9*  HCT 38* 39* 39* 39*  PLT 245 191 260 281   Lab Results  Component Value Date   TSH 3.60 10/16/2020   No results found for: "HGBA1C" Lab Results  Component Value Date   CHOL 119 05/10/2021   HDL 56 05/10/2021   LDLCALC 48 05/10/2021   TRIG 76 05/10/2021    Significant Diagnostic Results in last 30 days:  No results found.  Assessment/Plan 1. Blepharitis of left lower eyelid, unspecified type Erythromycin Ointment 4 /day for 2 weeks  2 Weight loss with FTT Enrolled in hospice    Family/ staff Communication:   Labs/tests ordered:

## 2021-12-24 ENCOUNTER — Non-Acute Institutional Stay (SKILLED_NURSING_FACILITY): Admitting: Internal Medicine

## 2021-12-24 ENCOUNTER — Encounter: Payer: Self-pay | Admitting: Internal Medicine

## 2021-12-24 DIAGNOSIS — G20 Parkinson's disease: Secondary | ICD-10-CM

## 2021-12-24 DIAGNOSIS — R569 Unspecified convulsions: Secondary | ICD-10-CM

## 2021-12-24 DIAGNOSIS — F02818 Dementia in other diseases classified elsewhere, unspecified severity, with other behavioral disturbance: Secondary | ICD-10-CM

## 2021-12-24 DIAGNOSIS — Z515 Encounter for palliative care: Secondary | ICD-10-CM | POA: Diagnosis not present

## 2021-12-24 DIAGNOSIS — R627 Adult failure to thrive: Secondary | ICD-10-CM

## 2021-12-24 MED ORDER — LORAZEPAM 0.5 MG PO TABS: 0.5000 mg | ORAL_TABLET | ORAL | 0 refills | Status: AC | PRN

## 2021-12-24 MED ORDER — MORPHINE SULFATE (CONCENTRATE) 20 MG/ML PO SOLN: 5.0000 mg | ORAL | 0 refills | Status: AC | PRN

## 2021-12-24 NOTE — Progress Notes (Signed)
Location: Occupational psychologist of Service:  SNF (31)  Provider:   Code Status: DNR and Hospice Goals of Care:     12/11/2021   12:01 PM  Advanced Directives  Does Patient Have a Medical Advance Directive? Yes  Type of Paramedic of [place];Living will;Out of facility DNR (pink MOST or yellow form)  Does patient want to make changes to medical advance directive? No - Patient declined  Copy of [place] in Chart? Yes - validated most recent copy scanned in chart (See row information)  Pre-existing out of facility DNR order (yellow form or pink MOST form) Yellow form placed in chart (order not valid for inpatient use)     Chief Complaint  Patient presents with   Acute Visit    HPI: Patient is a 88 y.o. male seen today for an acute visit for New onset Multiple seizures  Lives in SNF  Enrolled in hospice  Had seizure this morning and now another seizure Mostly been Post ictal and not eating Coughing Mucus and some blood Family wants him to be comfortable   Previous note  Patient with h/o Failure to thrive, Dementia, Hypoxia and Weight loss Patient has h/o Parkinson disease on Sinemet Follows with Neurology H/o CAD Follows with Dr [name] Chronic CHF with LE edema HTN, HLD Also h/o Crohn's Disease  Orthostatic hypotension Also has h/o OCD follows with Facility Psychiatry    [place]ovid with Severe symptoms OCD behavior with Hallucinations Left Arm weakness with Contractures CT Scan  of Brain and neck  Did not show any acute Infarct CT scan of Neck showed Advanced cervical spine degeneration,with Severe Spinal stenosis Weight loss   Enrolled in Hospice Past Medical History:  Diagnosis Date   Cellulitis    Chronic diastolic CHF (congestive heart failure) ([place])    a. EF initially 35-40% after MI 1/09; b. echo 7/08: EF 60%;  c. 11/2014 Echo: EF 65-70%, Gr 1 DD, mild MR.   Coronary artery disease    a. s/p  anterior STEMI 04/2007 with BMS-> LAD;  b. Cath 12/2011 patent stent;  c. low risk nuc 04/2014.   Crohn's disease ([place])    Diaphragmatic hernia without mention of obstruction or gangrene    Diverticulosis of colon (without mention of hemorrhage)    Fatty liver    a. on ultrasound of 10/2009   Flatulence, eructation, and gas pain    Fungal infection    Gait disorder    GERD (gastroesophageal reflux disease)    HOH (hard of hearing)    Hearing aids   Hyperlipidemia    Hypertension    Ischemic cardiomyopathy    a. EF initially 35-40% after MI 1/09; b. echo 7/08: EF 60%;  c. 11/2014 Echo: EF 65-70%, Gr 1 DD, mild MR.   Melanoma ([place])    Memory disorder    Orthostatic hypotension    Osteoporosis    Other chronic nonalcoholic liver disease    Other esophagitis    Parkinson's disease ([place])    RBBB 09/02/2013   Regional enteritis of small intestine ([place])    Renal calculi    Restless leg syndrome 03/22/2015   Ulcerative (chronic) ileocolitis ([facility])    Urinary incontinence     Past Surgical History:  Procedure Laterality Date   APPENDECTOMY     BACK SURGERY     L3, L4, L5   CARDIAC CATHETERIZATION  09/25/06   EF 35-40% but more recently 60%  CATARACT EXTRACTION     Bilateral   CHOLECYSTECTOMY     Coronary artery stent placement     Melanoma resection     Partial bowel resection     TONSILLECTOMY      No Known Allergies  Outpatient Encounter Medications as of 12/24/2021  Medication Sig   LORazepam (ATIVAN) 0.5 MG tablet Take 1 tablet (0.5 mg total) by mouth every 4 (four) hours as needed for anxiety.   morphine (ROXANOL) 20 MG/ML concentrated solution Take 0.25 mLs (5 mg total) by mouth every 4 (four) hours as needed for severe pain.   diclofenac Sodium (VOLTAREN) 1 % GEL Apply topically 3 (three) times daily as needed (Apply to the affected right ankle).   ipratropium (ATROVENT) 0.03 % nasal spray Place 2 sprays into both nostrils 3 (three) times daily.   lidocaine (XYLOCAINE) 2  % solution Use as directed 15 mLs in the mouth or throat as needed for mouth pain.   LORazepam (ATIVAN) 0.5 MG tablet Take 1 tablet (0.5 mg total) by mouth in the morning and at bedtime. (Patient taking differently: Take 0.5 mg by mouth every 4 (four) hours.)   nitroGLYCERIN (NITROSTAT) 0.4 MG SL tablet Place 1 tablet (0.4 mg total) under the tongue every 5 (five) minutes as needed for chest pain. PT OVERDUE FOR OV PLEASE CALL FOR APPT   OXYGEN Inhale 2 L into the lungs.   [DISCONTINUED] acetaminophen (TYLENOL) 325 MG tablet Take 650 mg by mouth at bedtime. And q 6 prn   [DISCONTINUED] aspirin EC 81 MG tablet Take 81 mg by mouth daily.   [DISCONTINUED] carbidopa-levodopa (SINEMET CR) 50-200 MG tablet Take 1 tablet by mouth 4 (four) times daily.   [DISCONTINUED] cetirizine (ZYRTEC) 10 MG tablet Take 1 tablet (10 mg total) by mouth at bedtime.   [DISCONTINUED] cholestyramine (QUESTRAN) 4 g packet TAKE 1 PACKET BY MOUTH TWICE DAILY WITH A MEAL   [DISCONTINUED] donepezil (ARICEPT) 10 MG tablet TAKE ONE TABLET AT BEDTIME   [DISCONTINUED] HYDROcodone-acetaminophen (NORCO) 5-325 MG tablet Take 1 tablet by mouth every 6 (six) hours as needed for moderate pain.   [DISCONTINUED] levalbuterol (XOPENEX HFA) 45 MCG/ACT inhaler Inhale 5 puffs into the lungs every 6 (six) hours as needed for wheezing.   [DISCONTINUED] loperamide (IMODIUM) 2 MG capsule Take 2 mg by mouth 3 (three) times daily as needed for diarrhea or loose stools.   [DISCONTINUED] midodrine (PROAMATINE) 5 MG tablet Take 1 tablet (5 mg total) by mouth 2 (two) times daily with a meal.   [DISCONTINUED] pantoprazole (PROTONIX) 40 MG tablet Take 40 mg by mouth daily.   [DISCONTINUED] PARoxetine (PAXIL) 20 MG tablet Take 20 mg by mouth 2 (two) times daily.   [DISCONTINUED] Probiotic Product (ALIGN) 4 MG CAPS Take 1 capsule by mouth daily.    [DISCONTINUED] QUEtiapine (SEROQUEL) 25 MG tablet Take 12.5 mg by mouth daily. And q 8 prn   [DISCONTINUED]  sodium chloride (OCEAN) 0.65 % SOLN nasal spray Place 2 sprays into both nostrils daily as needed for congestion.    No facility-administered encounter medications on file as of 12/24/2021.    Review of Systems:  Review of Systems  Unable to perform ROS: Patient unresponsive    Health Maintenance  Topic Date Due   INFLUENZA VACCINE  11/06/2021   COVID-19 Vaccine (4 - Moderna risk series) 01/07/2022 (Originally 03/14/2021)   TETANUS/TDAP  10/21/2028   Pneumonia Vaccine 60+ Years old  Completed   Zoster Vaccines- Shingrix  Completed  HPV VACCINES  Aged Out    Physical Exam: Vitals:   12/24/21 1604  BP: (!) 115/36  Pulse: 79  Resp: (!) 25  Temp: (!) 97.5 F (36.4 C)  SpO2: 99%   There is no height or weight on file to calculate BMI. Physical Exam Vitals reviewed.  Constitutional:      Comments: Post Ictal  HENT:     Head: Normocephalic.     Nose: Nose normal.     Mouth/Throat:     Mouth: Mucous membranes are moist.     Pharynx: Oropharynx is clear.  Eyes:     Pupils: Pupils are equal, round, and reactive to light.  Cardiovascular:     Rate and Rhythm: Normal rate and regular rhythm.     Pulses: Normal pulses.     Heart sounds: No murmur heard. Pulmonary:     Effort: Respiratory distress present.     Breath sounds: Rales present.  Abdominal:     General: Abdomen is flat. Bowel sounds are normal.     Palpations: Abdomen is soft.  Musculoskeletal:        General: No swelling.     Cervical back: Neck supple.  Skin:    General: Skin is warm.  Neurological:     Comments: Not responding  Psychiatric:        Mood and Affect: Mood normal.        Thought Content: Thought content normal.     Labs reviewed: Basic Metabolic Panel: Recent Labs    10/03/21 0000 10/05/21 0000 10/08/21 0000 10/18/21 0000  NA 142 143 145 137  K 4.0 3.8 4.8 4.3  CL 100 101 108 100  CO2 26* 26* 23* 23*  BUN 13 13 7 11   CREATININE 1.0 0.9 0.7 0.8  CALCIUM 6.0* 6.6* 8.3* 9.0  MG  0.7 0.9  --  1.8   Liver Function Tests: Recent Labs    06/04/21 0000 10/01/21 0000 10/05/21 0000  AST 26 27 24   ALT 12 9* 6*  ALKPHOS 71 92 99  ALBUMIN 4.4 3.9 4.2   No results for input(s): "LIPASE", "AMYLASE" in the last 8760 hours. No results for input(s): "AMMONIA" in the last 8760 hours. CBC: Recent Labs    06/04/21 0000 07/27/21 0000 08/03/21 0000 10/23/21 0000  WBC 7.4 6.5 10.5 9.7  NEUTROABS 4.40  --   --   --   HGB 12.9* 13.3* 13.1* 12.9*  HCT 38* 39* 39* 39*  PLT 245 191 260 281   Lipid Panel: Recent Labs    05/10/21 0000  CHOL 119  HDL 56  LDLCALC 48  TRIG 76   No results found for: "HGBA1C"  Procedures since last visit: No results found.  Assessment/Plan 1. New onset Seizures ([place]), FTT,  End of life care Ativan 0.5 mg q4 scheduled 0.5 mg PRN for seizures Discontinue all PO meds Roxanol also started       Labs/tests ordered:  * No order type specified * Next appt:  Visit date not found

## 2022-01-06 DEATH — deceased
